# Patient Record
Sex: Female | Born: 1945 | Race: White | Hispanic: No | Smoking: Former smoker
Health system: Southern US, Community
[De-identification: ages and names within clinical notes are randomized; demographics above are authoritative.]

## PROBLEM LIST (undated history)

## (undated) DIAGNOSIS — K069 Disorder of gingiva and edentulous alveolar ridge, unspecified: Secondary | ICD-10-CM

## (undated) DIAGNOSIS — C801 Malignant (primary) neoplasm, unspecified: Secondary | ICD-10-CM

## (undated) DIAGNOSIS — E7800 Pure hypercholesterolemia, unspecified: Secondary | ICD-10-CM

## (undated) DIAGNOSIS — I10 Essential (primary) hypertension: Secondary | ICD-10-CM

## (undated) DIAGNOSIS — N179 Acute kidney failure, unspecified: Secondary | ICD-10-CM

## (undated) DIAGNOSIS — B029 Zoster without complications: Secondary | ICD-10-CM

## (undated) DIAGNOSIS — IMO0002 Reserved for concepts with insufficient information to code with codable children: Secondary | ICD-10-CM

## (undated) DIAGNOSIS — F329 Major depressive disorder, single episode, unspecified: Secondary | ICD-10-CM

## (undated) DIAGNOSIS — M549 Dorsalgia, unspecified: Secondary | ICD-10-CM

## (undated) DIAGNOSIS — F32A Depression, unspecified: Secondary | ICD-10-CM

## (undated) DIAGNOSIS — Z972 Presence of dental prosthetic device (complete) (partial): Secondary | ICD-10-CM

## (undated) DIAGNOSIS — T8859XA Other complications of anesthesia, initial encounter: Secondary | ICD-10-CM

## (undated) DIAGNOSIS — F419 Anxiety disorder, unspecified: Secondary | ICD-10-CM

## (undated) DIAGNOSIS — Z87448 Personal history of other diseases of urinary system: Secondary | ICD-10-CM

## (undated) DIAGNOSIS — Z9289 Personal history of other medical treatment: Secondary | ICD-10-CM

## (undated) DIAGNOSIS — K8020 Calculus of gallbladder without cholecystitis without obstruction: Secondary | ICD-10-CM

## (undated) DIAGNOSIS — Z973 Presence of spectacles and contact lenses: Secondary | ICD-10-CM

## (undated) DIAGNOSIS — T4145XA Adverse effect of unspecified anesthetic, initial encounter: Secondary | ICD-10-CM

## (undated) DIAGNOSIS — I509 Heart failure, unspecified: Secondary | ICD-10-CM

## (undated) DIAGNOSIS — Z87442 Personal history of urinary calculi: Secondary | ICD-10-CM

## (undated) DIAGNOSIS — M6282 Rhabdomyolysis: Secondary | ICD-10-CM

## (undated) DIAGNOSIS — D696 Thrombocytopenia, unspecified: Secondary | ICD-10-CM

## (undated) DIAGNOSIS — IMO0001 Reserved for inherently not codable concepts without codable children: Secondary | ICD-10-CM

## (undated) DIAGNOSIS — C50919 Malignant neoplasm of unspecified site of unspecified female breast: Secondary | ICD-10-CM

## (undated) DIAGNOSIS — I499 Cardiac arrhythmia, unspecified: Secondary | ICD-10-CM

## (undated) DIAGNOSIS — I471 Supraventricular tachycardia: Secondary | ICD-10-CM

## (undated) DIAGNOSIS — J189 Pneumonia, unspecified organism: Secondary | ICD-10-CM

## (undated) HISTORY — PX: COLONOSCOPY: SHX174

## (undated) HISTORY — PX: TOE AMPUTATION: SHX809

## (undated) HISTORY — PX: MOUTH SURGERY: SHX715

## (undated) HISTORY — PX: SPINE SURGERY: SHX786

## (undated) HISTORY — PX: COLON BIOPSY: SHX1369

## (undated) HISTORY — DX: Disorder of gingiva and edentulous alveolar ridge, unspecified: K06.9

## (undated) HISTORY — DX: Reserved for inherently not codable concepts without codable children: IMO0001

## (undated) HISTORY — DX: Reserved for concepts with insufficient information to code with codable children: IMO0002

## (undated) HISTORY — PX: TUBAL LIGATION: SHX77

## (undated) HISTORY — PX: LITHOTRIPSY: SUR834

## (undated) HISTORY — PX: RHINOPLASTY: SUR1284

---

## 1980-05-15 HISTORY — PX: ABDOMINAL HYSTERECTOMY: SHX81

## 1987-05-16 HISTORY — PX: BACK SURGERY: SHX140

## 2002-01-08 ENCOUNTER — Emergency Department (HOSPITAL_COMMUNITY): Admission: EM | Admit: 2002-01-08 | Discharge: 2002-01-09 | Payer: Self-pay | Admitting: Emergency Medicine

## 2002-01-08 ENCOUNTER — Encounter: Payer: Self-pay | Admitting: Emergency Medicine

## 2002-10-31 ENCOUNTER — Ambulatory Visit (HOSPITAL_COMMUNITY): Admission: RE | Admit: 2002-10-31 | Discharge: 2002-10-31 | Payer: Self-pay | Admitting: Gastroenterology

## 2002-10-31 ENCOUNTER — Encounter (INDEPENDENT_AMBULATORY_CARE_PROVIDER_SITE_OTHER): Payer: Self-pay | Admitting: Specialist

## 2004-12-29 ENCOUNTER — Emergency Department (HOSPITAL_COMMUNITY): Admission: EM | Admit: 2004-12-29 | Discharge: 2004-12-29 | Payer: Self-pay | Admitting: Family Medicine

## 2008-04-17 ENCOUNTER — Other Ambulatory Visit: Admission: RE | Admit: 2008-04-17 | Discharge: 2008-04-17 | Payer: Self-pay | Admitting: Family Medicine

## 2008-07-22 ENCOUNTER — Emergency Department (HOSPITAL_COMMUNITY): Admission: EM | Admit: 2008-07-22 | Discharge: 2008-07-22 | Payer: Self-pay | Admitting: *Deleted

## 2008-07-23 ENCOUNTER — Ambulatory Visit (HOSPITAL_COMMUNITY): Admission: AD | Admit: 2008-07-23 | Discharge: 2008-07-23 | Payer: Self-pay | Admitting: Urology

## 2008-08-06 ENCOUNTER — Ambulatory Visit (HOSPITAL_COMMUNITY): Admission: RE | Admit: 2008-08-06 | Discharge: 2008-08-06 | Payer: Self-pay | Admitting: Urology

## 2009-05-15 HISTORY — PX: CHOLECYSTECTOMY: SHX55

## 2009-09-22 ENCOUNTER — Encounter (INDEPENDENT_AMBULATORY_CARE_PROVIDER_SITE_OTHER): Payer: Self-pay | Admitting: General Surgery

## 2009-09-22 ENCOUNTER — Inpatient Hospital Stay (HOSPITAL_COMMUNITY): Admission: EM | Admit: 2009-09-22 | Discharge: 2009-09-23 | Payer: Self-pay | Admitting: Emergency Medicine

## 2010-08-02 LAB — URINE CULTURE: Colony Count: >=100000

## 2010-08-02 LAB — URINALYSIS, ROUTINE W REFLEX MICROSCOPIC
Bilirubin Urine: NEGATIVE
Ketones, ur: NEGATIVE mg/dL
Nitrite: NEGATIVE
Protein, ur: 100 mg/dL — AB
pH: 7.5 (ref 5.0–8.0)

## 2010-08-02 LAB — COMPREHENSIVE METABOLIC PANEL
ALT: 110 U/L — ABNORMAL HIGH (ref 0–35)
AST: 156 U/L — ABNORMAL HIGH (ref 0–37)
Albumin: 4.3 g/dL (ref 3.5–5.2)
Alkaline Phosphatase: 131 U/L — ABNORMAL HIGH (ref 39–117)
BUN: 16 mg/dL (ref 6–23)
Calcium: 10.2 mg/dL (ref 8.4–10.5)
Creatinine, Ser: 1.03 mg/dL (ref 0.4–1.2)
GFR calc non Af Amer: 54 mL/min — ABNORMAL LOW (ref >60)
Potassium: 3.2 mEq/L — ABNORMAL LOW (ref 3.5–5.1)
Total Bilirubin: 1 mg/dL (ref 0.3–1.2)
Total Protein: 8.7 g/dL — ABNORMAL HIGH (ref 6.0–8.3)

## 2010-08-02 LAB — DIFFERENTIAL
Basophils Absolute: 0 10*3/uL (ref 0.0–0.1)
Basophils Relative: 0 % (ref 0–1)
Eosinophils Absolute: 0 10*3/uL (ref 0.0–0.7)
Eosinophils Relative: 0 % (ref 0–5)
Lymphs Abs: 1.2 10*3/uL (ref 0.7–4.0)
Neutrophils Relative %: 88 % — ABNORMAL HIGH (ref 43–77)

## 2010-08-02 LAB — CBC
HCT: 46 % (ref 36.0–46.0)
MCV: 82.9 fL (ref 78.0–100.0)
Platelets: 277 10*3/uL (ref 150–400)
RDW: 14.5 % (ref 11.5–15.5)

## 2010-08-02 LAB — GLUCOSE, CAPILLARY
Glucose-Capillary: 159 mg/dL — ABNORMAL HIGH (ref 70–99)
Glucose-Capillary: 162 mg/dL — ABNORMAL HIGH (ref 70–99)

## 2010-08-02 LAB — URINE MICROSCOPIC-ADD ON

## 2010-08-02 LAB — HEMOGLOBIN A1C
Hgb A1c MFr Bld: 6.4 % — ABNORMAL HIGH (ref <5.7)
Mean Plasma Glucose: 137 mg/dL — ABNORMAL HIGH (ref <117)

## 2010-08-02 LAB — LIPASE, BLOOD: Lipase: 36 U/L (ref 11–59)

## 2010-08-25 LAB — POCT I-STAT, CHEM 8
BUN: 23 mg/dL (ref 6–23)
Chloride: 104 mEq/L (ref 96–112)
Creatinine, Ser: 1.4 mg/dL — ABNORMAL HIGH (ref 0.4–1.2)
Hemoglobin: 16 g/dL — ABNORMAL HIGH (ref 12.0–15.0)
Potassium: 3.7 mEq/L (ref 3.5–5.1)
Sodium: 135 mEq/L (ref 135–145)

## 2010-08-25 LAB — URINE CULTURE
Colony Count: NO GROWTH
Culture: NO GROWTH

## 2010-08-25 LAB — BASIC METABOLIC PANEL
CO2: 25 mEq/L (ref 19–32)
GFR calc non Af Amer: 47 mL/min — ABNORMAL LOW (ref >60)
Glucose, Bld: 156 mg/dL — ABNORMAL HIGH (ref 70–99)
Potassium: 3.7 mEq/L (ref 3.5–5.1)
Sodium: 137 mEq/L (ref 135–145)

## 2010-08-25 LAB — URINALYSIS, ROUTINE W REFLEX MICROSCOPIC
Glucose, UA: NEGATIVE mg/dL
Specific Gravity, Urine: 1.021 (ref 1.005–1.030)

## 2010-08-25 LAB — URINE MICROSCOPIC-ADD ON

## 2010-09-27 NOTE — Op Note (Signed)
Cheyenne Riggs, Cheyenne Riggs                  ACCOUNT NO.:  [id number]   MEDICAL RECORD NO.:  [id number]          PATIENT TYPE:  AMB   LOCATION:  DAY                          FACILITY:  [facility]   PHYSICIAN:  Mark C. Vernie Ammons, M.D.  DATE OF BIRTH:  February 20, 1946   DATE OF PROCEDURE:  DATE OF DISCHARGE:                               OPERATIVE REPORT   PREOPERATIVE DIAGNOSIS:  Left ureteral stone.   POSTOPERATIVE DIAGNOSES:  1. Left ureteral stone.  2. Pyonephrosis.   PROCEDURE:  1. Cystoscopy.  2. Left ureteral catheterization with renal pelvic urine sampling.  3. Left double-J stent placement.   SURGEON:  Mark C. Vernie Ammons, M.D.   ANESTHESIA:  General.   DRAINS:  6-French 24-cm double-J stent in the left ureter (no string).   SPECIMENS:  Urine obtained from the left renal pelvis for culture and  sensitivity.   BLOOD LOSS:  None.   COMPLICATIONS:  None.   INDICATIONS:  The patient is a 65 year old white female who presented to  the emergency room yesterday with severe left flank pain.  There she was  felt to have infected urine and was started on intravenous antibiotics,  and then given oral Cipro to take it home.  She came in to see Dr.  Vonita Moss today and was found to have some white cells in her urine.  She  continued to have severe pain and therefore we discussed treatment  options.  I discussed with her the fact that I would perform  ureteroscopic treatment of her stone if I found no evidence of purulence  of both the stone at the time of the surgery.  She understands the  procedure as well as its risks, complications and alternatives, and has  elected to proceed with surgery.   DESCRIPTION OF OPERATION:  After informed consent, the patient was  brought to the major OR, placed on the table and administered general  anesthesia in the dorsal lithotomy position.  Her genitalia was  sterilely prepped and draped, and an official time-out was then  performed.  Initially a 22-French  cystoscope was passed into the bladder  under direct visualization with a 12-degree lens, and the bladder was  noted to have multiple raised nodules 1 mm and less in size --  consistent with cystitis follicularis.  These were photographed.  I  noted no tumors or stones within the bladder.  The ureteral orifices  appeared to be normal configuration and position, although the left  ureteral orifice appeared to be somewhat slow swollen.   A 0.038-inch  floppy tip guidewire was then passed up the left ureter  under direct fluoroscopic control, into the area of the renal pelvis.  I  then passed a 6-French open-ended ureteral catheter over the guidewire  into the renal pelvis.  As I removed the guidewire I noted gross  purulence coming from the end of the catheter and then it cleared  slightly.  I obtained cloudy urine from the renal pelvis as it dripped  from the open-ended catheter.  I therefore reinserted the guidewire and  removed the open-ended catheter.  A double-J stent was then passed over  the guidewire into the area of the renal pelvis; and as the guidewire  was removed, good curl was noted in the renal pelvis and in the bladder.   FINDINGS:  Findings in the bladder are consistent with infection and  there was clearly gross purulence above the stone.  Therefore, I elected  not to proceed with extraction of the stone at this time due to the  risk.  I had discussed that with the patient as a possibility prior to  the procedure.   She is on Cipro and I will await the culture results.  Once those return  I will treat according to sensitivities, and have her back to the office  next week to schedule definitive ureteroscopy and stent treatment of her  stone versus lithotripsy.      Mark C. Vernie Ammons, M.D.  Electronically Signed     MCO/MEDQ  D:  07/23/2008  T:  07/23/2008  Job:  [id number]

## 2010-09-30 NOTE — Op Note (Signed)
   Cheyenne Riggs, Cheyenne Riggs                            ACCOUNT NO.:  [id number]   MEDICAL RECORD NO.:  [id number]                   PATIENT TYPE:  AMB   LOCATION:  ENDO                                 FACILITY:  [facility]   PHYSICIAN:  Graylin Shiver, M.D.                DATE OF BIRTH:  [birth year]   DATE OF PROCEDURE:  10/31/2002  DATE OF DISCHARGE:                                 OPERATIVE REPORT   PROCEDURE:  Colonoscopy with biopsy.   INDICATIONS FOR PROCEDURE:  Rectal bleeding, family history of colon polyps.   INFORMED CONSENT:  Informed consent was obtained.   PREMEDICATIONS:  1. Fentanyl 75 mcg IV.  2. Versed 6 mg IV.   DESCRIPTION OF PROCEDURE:  With the patient in the left lateral decubitus  position, a rectal exam was performed.  No masses were felt.  The Olympus  colonoscope was inserted into the rectum and advanced around the very  tortuous colon to the cecum.  Cecal landmarks were identified.  The cecum  looked normal.  In the ascending colon, there were two small sessile polyps  which were removed with cold forceps.  In the transverse colon, there were a  couple of small sessile polyps removed with cold forceps.  The descending  colon looked normal.  The sigmoid and rectum revealed a couple of small  hyperplastic-appearing polyps removed with cold forceps.  The sigmoid showed  an occasional diverticulum.  She tolerated the procedure well without  complications.    IMPRESSION:  1. Several small colon polyps.  2. Mild diverticulosis of the sigmoid.   PLAN:  The pathology will be checked.                                               Graylin Shiver, M.D.    SFG/MEDQ  D:  10/31/2002  T:  10/31/2002  Job:  [id number]   cc:   Jillyn Hidden B. Truett Perna, M.D.  [address]  [place]  [place] [address]  Fax: [phone]

## 2011-12-27 DIAGNOSIS — N200 Calculus of kidney: Secondary | ICD-10-CM | POA: Diagnosis not present

## 2011-12-27 DIAGNOSIS — H16149 Punctate keratitis, unspecified eye: Secondary | ICD-10-CM | POA: Diagnosis not present

## 2011-12-27 DIAGNOSIS — N393 Stress incontinence (female) (male): Secondary | ICD-10-CM | POA: Diagnosis not present

## 2012-01-02 DIAGNOSIS — H40019 Open angle with borderline findings, low risk, unspecified eye: Secondary | ICD-10-CM | POA: Diagnosis not present

## 2012-01-02 DIAGNOSIS — E119 Type 2 diabetes mellitus without complications: Secondary | ICD-10-CM | POA: Diagnosis not present

## 2012-01-02 DIAGNOSIS — H04129 Dry eye syndrome of unspecified lacrimal gland: Secondary | ICD-10-CM | POA: Diagnosis not present

## 2012-01-11 DIAGNOSIS — E119 Type 2 diabetes mellitus without complications: Secondary | ICD-10-CM | POA: Diagnosis not present

## 2012-01-25 DIAGNOSIS — N393 Stress incontinence (female) (male): Secondary | ICD-10-CM | POA: Diagnosis not present

## 2012-01-30 DIAGNOSIS — E7800 Pure hypercholesterolemia, unspecified: Secondary | ICD-10-CM | POA: Diagnosis not present

## 2012-01-30 DIAGNOSIS — E119 Type 2 diabetes mellitus without complications: Secondary | ICD-10-CM | POA: Diagnosis not present

## 2012-01-30 DIAGNOSIS — Z79899 Other long term (current) drug therapy: Secondary | ICD-10-CM | POA: Diagnosis not present

## 2012-02-01 DIAGNOSIS — N393 Stress incontinence (female) (male): Secondary | ICD-10-CM | POA: Diagnosis not present

## 2012-02-01 DIAGNOSIS — N318 Other neuromuscular dysfunction of bladder: Secondary | ICD-10-CM | POA: Diagnosis not present

## 2012-02-01 DIAGNOSIS — N3941 Urge incontinence: Secondary | ICD-10-CM | POA: Diagnosis not present

## 2012-02-06 DIAGNOSIS — E119 Type 2 diabetes mellitus without complications: Secondary | ICD-10-CM | POA: Diagnosis not present

## 2012-02-06 DIAGNOSIS — H40019 Open angle with borderline findings, low risk, unspecified eye: Secondary | ICD-10-CM | POA: Diagnosis not present

## 2012-02-06 DIAGNOSIS — H04129 Dry eye syndrome of unspecified lacrimal gland: Secondary | ICD-10-CM | POA: Diagnosis not present

## 2012-02-12 DIAGNOSIS — M6281 Muscle weakness (generalized): Secondary | ICD-10-CM | POA: Diagnosis not present

## 2012-02-12 DIAGNOSIS — R279 Unspecified lack of coordination: Secondary | ICD-10-CM | POA: Diagnosis not present

## 2012-02-12 DIAGNOSIS — N3000 Acute cystitis without hematuria: Secondary | ICD-10-CM | POA: Diagnosis not present

## 2012-02-12 DIAGNOSIS — N3941 Urge incontinence: Secondary | ICD-10-CM | POA: Diagnosis not present

## 2012-02-27 DIAGNOSIS — Z23 Encounter for immunization: Secondary | ICD-10-CM | POA: Diagnosis not present

## 2012-03-07 DIAGNOSIS — R279 Unspecified lack of coordination: Secondary | ICD-10-CM | POA: Diagnosis not present

## 2012-03-07 DIAGNOSIS — M6281 Muscle weakness (generalized): Secondary | ICD-10-CM | POA: Diagnosis not present

## 2012-03-07 DIAGNOSIS — N393 Stress incontinence (female) (male): Secondary | ICD-10-CM | POA: Diagnosis not present

## 2012-03-07 DIAGNOSIS — N3941 Urge incontinence: Secondary | ICD-10-CM | POA: Diagnosis not present

## 2012-03-11 DIAGNOSIS — N3941 Urge incontinence: Secondary | ICD-10-CM | POA: Diagnosis not present

## 2012-03-11 DIAGNOSIS — N393 Stress incontinence (female) (male): Secondary | ICD-10-CM | POA: Diagnosis not present

## 2012-03-11 DIAGNOSIS — R279 Unspecified lack of coordination: Secondary | ICD-10-CM | POA: Diagnosis not present

## 2012-03-11 DIAGNOSIS — M6281 Muscle weakness (generalized): Secondary | ICD-10-CM | POA: Diagnosis not present

## 2012-03-21 DIAGNOSIS — N3941 Urge incontinence: Secondary | ICD-10-CM | POA: Diagnosis not present

## 2012-03-21 DIAGNOSIS — R279 Unspecified lack of coordination: Secondary | ICD-10-CM | POA: Diagnosis not present

## 2012-03-21 DIAGNOSIS — M6281 Muscle weakness (generalized): Secondary | ICD-10-CM | POA: Diagnosis not present

## 2012-03-21 DIAGNOSIS — N393 Stress incontinence (female) (male): Secondary | ICD-10-CM | POA: Diagnosis not present

## 2012-04-16 DIAGNOSIS — N393 Stress incontinence (female) (male): Secondary | ICD-10-CM | POA: Diagnosis not present

## 2012-04-16 DIAGNOSIS — R279 Unspecified lack of coordination: Secondary | ICD-10-CM | POA: Diagnosis not present

## 2012-04-16 DIAGNOSIS — N3941 Urge incontinence: Secondary | ICD-10-CM | POA: Diagnosis not present

## 2012-04-16 DIAGNOSIS — M6281 Muscle weakness (generalized): Secondary | ICD-10-CM | POA: Diagnosis not present

## 2012-05-06 DIAGNOSIS — E782 Mixed hyperlipidemia: Secondary | ICD-10-CM | POA: Diagnosis not present

## 2012-05-06 DIAGNOSIS — I10 Essential (primary) hypertension: Secondary | ICD-10-CM | POA: Diagnosis not present

## 2012-05-06 DIAGNOSIS — E119 Type 2 diabetes mellitus without complications: Secondary | ICD-10-CM | POA: Diagnosis not present

## 2012-05-06 DIAGNOSIS — Z23 Encounter for immunization: Secondary | ICD-10-CM | POA: Diagnosis not present

## 2012-05-21 DIAGNOSIS — N3941 Urge incontinence: Secondary | ICD-10-CM | POA: Diagnosis not present

## 2012-05-21 DIAGNOSIS — M6281 Muscle weakness (generalized): Secondary | ICD-10-CM | POA: Diagnosis not present

## 2012-05-21 DIAGNOSIS — R279 Unspecified lack of coordination: Secondary | ICD-10-CM | POA: Diagnosis not present

## 2012-05-21 DIAGNOSIS — N393 Stress incontinence (female) (male): Secondary | ICD-10-CM | POA: Diagnosis not present

## 2012-09-07 DIAGNOSIS — J209 Acute bronchitis, unspecified: Secondary | ICD-10-CM | POA: Diagnosis not present

## 2012-09-17 DIAGNOSIS — I10 Essential (primary) hypertension: Secondary | ICD-10-CM | POA: Diagnosis not present

## 2012-09-17 DIAGNOSIS — F17200 Nicotine dependence, unspecified, uncomplicated: Secondary | ICD-10-CM | POA: Diagnosis not present

## 2012-09-17 DIAGNOSIS — E1129 Type 2 diabetes mellitus with other diabetic kidney complication: Secondary | ICD-10-CM | POA: Diagnosis not present

## 2012-09-17 DIAGNOSIS — Z23 Encounter for immunization: Secondary | ICD-10-CM | POA: Diagnosis not present

## 2012-09-17 DIAGNOSIS — E7800 Pure hypercholesterolemia, unspecified: Secondary | ICD-10-CM | POA: Diagnosis not present

## 2012-09-17 DIAGNOSIS — Z13820 Encounter for screening for osteoporosis: Secondary | ICD-10-CM | POA: Diagnosis not present

## 2012-09-23 DIAGNOSIS — Z1231 Encounter for screening mammogram for malignant neoplasm of breast: Secondary | ICD-10-CM | POA: Diagnosis not present

## 2012-09-23 DIAGNOSIS — Z780 Asymptomatic menopausal state: Secondary | ICD-10-CM | POA: Diagnosis not present

## 2013-01-30 DIAGNOSIS — Z1211 Encounter for screening for malignant neoplasm of colon: Secondary | ICD-10-CM | POA: Diagnosis not present

## 2013-01-30 DIAGNOSIS — E1129 Type 2 diabetes mellitus with other diabetic kidney complication: Secondary | ICD-10-CM | POA: Diagnosis not present

## 2013-01-30 DIAGNOSIS — I10 Essential (primary) hypertension: Secondary | ICD-10-CM | POA: Diagnosis not present

## 2013-01-30 DIAGNOSIS — Z23 Encounter for immunization: Secondary | ICD-10-CM | POA: Diagnosis not present

## 2013-01-30 DIAGNOSIS — F17200 Nicotine dependence, unspecified, uncomplicated: Secondary | ICD-10-CM | POA: Diagnosis not present

## 2013-02-13 DIAGNOSIS — H35039 Hypertensive retinopathy, unspecified eye: Secondary | ICD-10-CM | POA: Diagnosis not present

## 2013-02-13 DIAGNOSIS — H3560 Retinal hemorrhage, unspecified eye: Secondary | ICD-10-CM | POA: Diagnosis not present

## 2013-02-13 DIAGNOSIS — H40019 Open angle with borderline findings, low risk, unspecified eye: Secondary | ICD-10-CM | POA: Diagnosis not present

## 2013-02-13 DIAGNOSIS — E119 Type 2 diabetes mellitus without complications: Secondary | ICD-10-CM | POA: Diagnosis not present

## 2013-02-13 DIAGNOSIS — H2510 Age-related nuclear cataract, unspecified eye: Secondary | ICD-10-CM | POA: Diagnosis not present

## 2013-02-13 DIAGNOSIS — H04129 Dry eye syndrome of unspecified lacrimal gland: Secondary | ICD-10-CM | POA: Diagnosis not present

## 2013-02-28 DIAGNOSIS — Z23 Encounter for immunization: Secondary | ICD-10-CM | POA: Diagnosis not present

## 2013-04-17 DIAGNOSIS — H04129 Dry eye syndrome of unspecified lacrimal gland: Secondary | ICD-10-CM | POA: Diagnosis not present

## 2013-04-17 DIAGNOSIS — H40019 Open angle with borderline findings, low risk, unspecified eye: Secondary | ICD-10-CM | POA: Diagnosis not present

## 2013-07-17 DIAGNOSIS — H35039 Hypertensive retinopathy, unspecified eye: Secondary | ICD-10-CM | POA: Diagnosis not present

## 2013-07-17 DIAGNOSIS — H40019 Open angle with borderline findings, low risk, unspecified eye: Secondary | ICD-10-CM | POA: Diagnosis not present

## 2013-07-17 DIAGNOSIS — E119 Type 2 diabetes mellitus without complications: Secondary | ICD-10-CM | POA: Diagnosis not present

## 2013-07-17 DIAGNOSIS — H04129 Dry eye syndrome of unspecified lacrimal gland: Secondary | ICD-10-CM | POA: Diagnosis not present

## 2013-08-03 DIAGNOSIS — J209 Acute bronchitis, unspecified: Secondary | ICD-10-CM | POA: Diagnosis not present

## 2013-08-03 DIAGNOSIS — R059 Cough, unspecified: Secondary | ICD-10-CM | POA: Diagnosis not present

## 2013-08-03 DIAGNOSIS — R05 Cough: Secondary | ICD-10-CM | POA: Diagnosis not present

## 2013-12-10 DIAGNOSIS — M47817 Spondylosis without myelopathy or radiculopathy, lumbosacral region: Secondary | ICD-10-CM | POA: Diagnosis not present

## 2013-12-10 DIAGNOSIS — IMO0002 Reserved for concepts with insufficient information to code with codable children: Secondary | ICD-10-CM | POA: Diagnosis not present

## 2013-12-10 DIAGNOSIS — M4310 Spondylolisthesis, site unspecified: Secondary | ICD-10-CM | POA: Diagnosis not present

## 2013-12-18 DIAGNOSIS — M48061 Spinal stenosis, lumbar region without neurogenic claudication: Secondary | ICD-10-CM | POA: Diagnosis not present

## 2013-12-18 DIAGNOSIS — M545 Low back pain, unspecified: Secondary | ICD-10-CM | POA: Diagnosis not present

## 2013-12-25 DIAGNOSIS — M48061 Spinal stenosis, lumbar region without neurogenic claudication: Secondary | ICD-10-CM | POA: Diagnosis not present

## 2013-12-25 DIAGNOSIS — M545 Low back pain, unspecified: Secondary | ICD-10-CM | POA: Diagnosis not present

## 2013-12-30 DIAGNOSIS — M545 Low back pain, unspecified: Secondary | ICD-10-CM | POA: Diagnosis not present

## 2013-12-30 DIAGNOSIS — M48061 Spinal stenosis, lumbar region without neurogenic claudication: Secondary | ICD-10-CM | POA: Diagnosis not present

## 2014-01-01 DIAGNOSIS — M545 Low back pain, unspecified: Secondary | ICD-10-CM | POA: Diagnosis not present

## 2014-01-01 DIAGNOSIS — M48061 Spinal stenosis, lumbar region without neurogenic claudication: Secondary | ICD-10-CM | POA: Diagnosis not present

## 2014-01-06 DIAGNOSIS — M48061 Spinal stenosis, lumbar region without neurogenic claudication: Secondary | ICD-10-CM | POA: Diagnosis not present

## 2014-01-06 DIAGNOSIS — M545 Low back pain, unspecified: Secondary | ICD-10-CM | POA: Diagnosis not present

## 2014-01-08 DIAGNOSIS — M545 Low back pain, unspecified: Secondary | ICD-10-CM | POA: Diagnosis not present

## 2014-01-08 DIAGNOSIS — M48061 Spinal stenosis, lumbar region without neurogenic claudication: Secondary | ICD-10-CM | POA: Diagnosis not present

## 2014-01-13 DIAGNOSIS — M545 Low back pain, unspecified: Secondary | ICD-10-CM | POA: Diagnosis not present

## 2014-01-13 DIAGNOSIS — M48061 Spinal stenosis, lumbar region without neurogenic claudication: Secondary | ICD-10-CM | POA: Diagnosis not present

## 2014-01-15 DIAGNOSIS — M48061 Spinal stenosis, lumbar region without neurogenic claudication: Secondary | ICD-10-CM | POA: Diagnosis not present

## 2014-01-15 DIAGNOSIS — M545 Low back pain, unspecified: Secondary | ICD-10-CM | POA: Diagnosis not present

## 2014-01-19 DIAGNOSIS — H019 Unspecified inflammation of eyelid: Secondary | ICD-10-CM | POA: Diagnosis not present

## 2014-01-20 DIAGNOSIS — M48061 Spinal stenosis, lumbar region without neurogenic claudication: Secondary | ICD-10-CM | POA: Diagnosis not present

## 2014-01-20 DIAGNOSIS — M545 Low back pain, unspecified: Secondary | ICD-10-CM | POA: Diagnosis not present

## 2014-01-22 DIAGNOSIS — M48061 Spinal stenosis, lumbar region without neurogenic claudication: Secondary | ICD-10-CM | POA: Diagnosis not present

## 2014-01-22 DIAGNOSIS — M545 Low back pain, unspecified: Secondary | ICD-10-CM | POA: Diagnosis not present

## 2014-01-23 DIAGNOSIS — F329 Major depressive disorder, single episode, unspecified: Secondary | ICD-10-CM | POA: Diagnosis not present

## 2014-01-23 DIAGNOSIS — F17200 Nicotine dependence, unspecified, uncomplicated: Secondary | ICD-10-CM | POA: Diagnosis not present

## 2014-01-23 DIAGNOSIS — E559 Vitamin D deficiency, unspecified: Secondary | ICD-10-CM | POA: Diagnosis not present

## 2014-01-23 DIAGNOSIS — D126 Benign neoplasm of colon, unspecified: Secondary | ICD-10-CM | POA: Diagnosis not present

## 2014-01-23 DIAGNOSIS — E785 Hyperlipidemia, unspecified: Secondary | ICD-10-CM | POA: Diagnosis not present

## 2014-01-23 DIAGNOSIS — E1129 Type 2 diabetes mellitus with other diabetic kidney complication: Secondary | ICD-10-CM | POA: Diagnosis not present

## 2014-01-23 DIAGNOSIS — N189 Chronic kidney disease, unspecified: Secondary | ICD-10-CM | POA: Diagnosis not present

## 2014-01-27 DIAGNOSIS — Z1231 Encounter for screening mammogram for malignant neoplasm of breast: Secondary | ICD-10-CM | POA: Diagnosis not present

## 2014-01-29 DIAGNOSIS — N269 Renal sclerosis, unspecified: Secondary | ICD-10-CM | POA: Diagnosis not present

## 2014-01-29 DIAGNOSIS — M545 Low back pain, unspecified: Secondary | ICD-10-CM | POA: Diagnosis not present

## 2014-01-29 DIAGNOSIS — N289 Disorder of kidney and ureter, unspecified: Secondary | ICD-10-CM | POA: Diagnosis not present

## 2014-01-29 DIAGNOSIS — N200 Calculus of kidney: Secondary | ICD-10-CM | POA: Diagnosis not present

## 2014-01-29 DIAGNOSIS — M48061 Spinal stenosis, lumbar region without neurogenic claudication: Secondary | ICD-10-CM | POA: Diagnosis not present

## 2014-01-30 DIAGNOSIS — N63 Unspecified lump in unspecified breast: Secondary | ICD-10-CM | POA: Diagnosis not present

## 2014-02-03 DIAGNOSIS — Z23 Encounter for immunization: Secondary | ICD-10-CM | POA: Diagnosis not present

## 2014-02-05 ENCOUNTER — Other Ambulatory Visit: Payer: Self-pay | Admitting: Radiology

## 2014-02-05 DIAGNOSIS — N63 Unspecified lump in unspecified breast: Secondary | ICD-10-CM | POA: Diagnosis not present

## 2014-02-05 DIAGNOSIS — C50419 Malignant neoplasm of upper-outer quadrant of unspecified female breast: Secondary | ICD-10-CM | POA: Diagnosis not present

## 2014-02-05 HISTORY — PX: BREAST BIOPSY: SHX20

## 2014-02-09 DIAGNOSIS — C50419 Malignant neoplasm of upper-outer quadrant of unspecified female breast: Secondary | ICD-10-CM | POA: Diagnosis not present

## 2014-02-16 ENCOUNTER — Telehealth: Payer: Self-pay | Admitting: *Deleted

## 2014-02-16 NOTE — Telephone Encounter (Signed)
Received referral from [place].  Called pt and confirmed 03/24/14 appt w/ her.  Mailed before appt letter, welcoming packet & intake form to pt.  Emailed Engineer, civil (consulting) at [employer] to make her aware.  Sent paperwork to HIM to scan.

## 2014-02-16 NOTE — Progress Notes (Signed)
Location of Breast Cancer:Right Breast upper outer quadrant.  Histology per Pathology Report:02/05/14: FINAL DIAGNOSIS Diagnosis Breast, right, needle core biopsy, mass, UOQ 10 o'clock - INVASIVE DUCTAL CARCINOMA, SEE COMMENT. Receptor Status: ER(), PR (), Her2-neu ()  Did patient present with symptoms (if so, please note symptoms) or was this found on screening mammography?:found on screening mammogram  Past/Anticipated interventions by surgeon, if OVF:IEPPIRJJO for 02/27/14 with Dr.Byerly  Past/Anticipated interventions by medical oncology, if any: Chemotherapy.Scheduled Friday 02/20/14 at 12:30 pm  Lymphedema issues, if any:    Pain issues, if any: Back pain  SAFETY ISSUES:  Prior radiation? No  Pacemaker/ICD? No  Possible current pregnancy?No  Is the patient on methotrexate? No  Current Complaints / other details:Divorced.Menses age 22.first birth age 71.1 son.last menstrual period age 75.Hormonal replacement therapy less than 10 years. Smoker; 1ppd time x 30 years    Arlyss Repress, RN 02/16/2014,10:45 AM

## 2014-02-17 ENCOUNTER — Other Ambulatory Visit (INDEPENDENT_AMBULATORY_CARE_PROVIDER_SITE_OTHER): Payer: Self-pay | Admitting: General Surgery

## 2014-02-17 DIAGNOSIS — C50911 Malignant neoplasm of unspecified site of right female breast: Secondary | ICD-10-CM

## 2014-02-18 ENCOUNTER — Ambulatory Visit
Admission: RE | Admit: 2014-02-18 | Discharge: 2014-02-18 | Disposition: A | Discharge status: Home / Self Care | Payer: Medicare Other | Source: Ambulatory Visit | Attending: Radiation Oncology | Admitting: Radiation Oncology

## 2014-02-18 ENCOUNTER — Telehealth: Payer: Self-pay | Admitting: *Deleted

## 2014-02-18 ENCOUNTER — Encounter: Payer: Self-pay | Admitting: Radiation Oncology

## 2014-02-18 DIAGNOSIS — C50411 Malignant neoplasm of upper-outer quadrant of right female breast: Secondary | ICD-10-CM | POA: Insufficient documentation

## 2014-02-18 DIAGNOSIS — I10 Essential (primary) hypertension: Secondary | ICD-10-CM

## 2014-02-18 DIAGNOSIS — C50419 Malignant neoplasm of upper-outer quadrant of unspecified female breast: Secondary | ICD-10-CM | POA: Diagnosis not present

## 2014-02-18 DIAGNOSIS — Z510 Encounter for antineoplastic radiation therapy: Secondary | ICD-10-CM | POA: Diagnosis not present

## 2014-02-18 HISTORY — DX: Calculus of gallbladder without cholecystitis without obstruction: K80.20

## 2014-02-18 HISTORY — DX: Essential (primary) hypertension: I10

## 2014-02-18 HISTORY — DX: Pure hypercholesterolemia, unspecified: E78.00

## 2014-02-18 HISTORY — DX: Dorsalgia, unspecified: M54.9

## 2014-02-18 HISTORY — DX: Anxiety disorder, unspecified: F41.9

## 2014-02-18 HISTORY — DX: Major depressive disorder, single episode, unspecified: F32.9

## 2014-02-18 HISTORY — DX: Personal history of other diseases of urinary system: Z87.448

## 2014-02-18 HISTORY — DX: Depression, unspecified: F32.A

## 2014-02-18 NOTE — Progress Notes (Signed)
  Radiation Oncology         [phone]) [phone] ________________________________  Initial outpatient Consultation - Date: 02/18/2014   Name: Cheyenne Riggs MRN: [id number]   DOB: February 07, 1946  REFERRING PHYSICIAN: Stark Klein, MD  DIAGNOSIS:    ICD-9-CM ICD-10-CM  1. 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 female  who underwent a screening mammogram. A abnormality was seen in the upper outer quadrant of the right breast. This was biopsied and found to be a triple negative invasive ductal carcinoma. Ultrasound showed a 1.2 cm mass. MRI was not performed. She has met with Dr. Barry Dienes and has elected for breast conservation. She is seeing medical oncology on Friday. She has no family history of breast cancer. She is postmenopausal and never used hormone replacement therapy. She had menarche at 31 with her age at menopause over the past 5 or 6 years. She is BX G1 with her first child at 42. She really is in good health with a past medical history of a cholecystectomy, partial hysterectomy, low back surgery, tubal ligation, and kidney stones. She also has anxiety disorder, depression, high blood pressure, and high cholesterol.  PREVIOUS RADIATION THERAPY: No  FAMILY HISTORY: No family history on file.  SOCIAL HISTORY:  History  Substance Use Topics  . Smoking status: Not on file  . Smokeless tobacco: Not on file  . Alcohol Use: Not on file    REVIEW OF SYSTEMS:  A 15 point review of systems is documented in the electronic medical record. This was obtained by the nursing staff. However, I reviewed this with the patient to discuss relevant findings and make appropriate changes.  Pertinent positives are included in the chart.   PHYSICAL EXAM: There were no vitals filed for this visit.. . Pleasant female in no distress sitting comfortably on  examining table. She is alert minus x3. She has no palpable cervical or subclavicular adenopathy. She has some biopsy change in the upper outer quadrant of the right breast with no palpable masses. No palpable abnormalities of the left breast. No palpable axillary supraclavicular or cervical adenopathy.  IMPRESSION: T1 C. N0 triple negative right breast cancer  PLAN:I spoke to the patient today regarding her diagnosis and options for treatment. We discussed the equivalence in terms of survival and local failure between mastectomy and breast conservation. We discussed the role of radiation in decreasing local failures in patients who undergo lumpectomy. We discussed the process of simulation and the placement tattoos. We discussed 6 weeks of treatment as an outpatient. We discussed the possibility of asymptomatic lung damage. We discussed the low likelihood of secondary malignancies. We discussed the possible side effects including but not limited to skin redness, fatigue, permanent skin darkening, and breast swelling.    We discussed that if she needed chemotherapy which she likely will given her triple negative status, that that would be performed prior to radiation.  I spent 40 minutes  face to face with the patient and more than 50% of that time was spent in counseling and/or coordination of care.   ------------------------------------------------  Thea Silversmith, MD

## 2014-02-18 NOTE — Telephone Encounter (Signed)
Per Dawn - changed pts appt to 10/9 at 12:30.  Pt is aware.

## 2014-02-18 NOTE — Progress Notes (Signed)
Please see the Nurse Progress Note in the MD Initial Consult Encounter for this patient. 

## 2014-02-19 NOTE — Addendum Note (Signed)
Encounter addended by: Arlyss Repress, RN on: 02/19/2014  8:43 AM<BR>     Documentation filed: Charges VN

## 2014-02-20 ENCOUNTER — Encounter (INDEPENDENT_AMBULATORY_CARE_PROVIDER_SITE_OTHER): Payer: Self-pay

## 2014-02-20 ENCOUNTER — Ambulatory Visit (HOSPITAL_BASED_OUTPATIENT_CLINIC_OR_DEPARTMENT_OTHER): Payer: Medicare Other | Admitting: Hematology and Oncology

## 2014-02-20 ENCOUNTER — Telehealth: Payer: Self-pay | Admitting: Hematology and Oncology

## 2014-02-20 ENCOUNTER — Encounter: Payer: Self-pay | Admitting: Hematology and Oncology

## 2014-02-20 ENCOUNTER — Ambulatory Visit: Payer: Medicare Other

## 2014-02-20 ENCOUNTER — Other Ambulatory Visit (INDEPENDENT_AMBULATORY_CARE_PROVIDER_SITE_OTHER): Payer: Self-pay | Admitting: General Surgery

## 2014-02-20 VITALS — BP 147/67 | HR 88 | Temp 98.2°F | Resp 19 | Ht 63.0 in | Wt 209.3 lb

## 2014-02-20 DIAGNOSIS — C50911 Malignant neoplasm of unspecified site of right female breast: Secondary | ICD-10-CM

## 2014-02-20 DIAGNOSIS — C50411 Malignant neoplasm of upper-outer quadrant of right female breast: Secondary | ICD-10-CM | POA: Diagnosis not present

## 2014-02-20 NOTE — Progress Notes (Signed)
[facility] CONSULT NOTE  Patient Care Team: Hulan Fess, MD as PCP - General (Family Medicine)  CHIEF COMPLAINTS/PURPOSE OF CONSULTATION:  Newly diagnosed breast cancer  HISTORY OF PRESENTING ILLNESS:  Cheyenne Riggs 68 y.o. female is here because of recent diagnosis of right-sided breast cancer. She had a routine screening mammogram that identified a 1.8 cm mass in the right breast. She had an ultrasound and a biopsy that revealed triple negative invasive ductal carcinoma. She had seen Dr. Barry Dienes who possibly surgery with lumpectomy. She was presented at the breast tumor board and the recommendation was for her to receive adjuvant systemic chemotherapy. She was asked to come today so that we can discuss adjuvant treatment options. She is by herself. Her son lives in [place]. She plans to do her entire treatment by driving herself to each of these treatments.  I reviewed her records extensively and collaborated the history with the patient. 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 She describes that she may have been exposed to dust and chemicals all her life.  In terms of breast cancer risk profile:  She menarched at early age of 58 and went to menopause at age 60  She had one pregnancy, her first child was born at age 48  She has received birth control pills for approximately 10-15 years.  She was never exposed to fertility medications or hormone replacement therapy.  She has no family history of Breast/GYN/GI cancer  MEDICAL HISTORY:  Past Medical History  Diagnosis Date  . Anxiety   . Back pain   . H/O bladder problems   . Cholelithiasis   . Depression   . High blood pressure   . Hypercholesteremia   . Kidney stones     SURGICAL HISTORY: Past Surgical History  Procedure Laterality Date  . Breast biopsy Right 02/05/2014    invasive ductal cncer  . Colon  biopsy    . Cholecystectomy    . Mouth surgery    . Spine surgery    . Colonoscopy    . Tubal ligation    . Back surgery      SOCIAL HISTORY: History   Social History  . Marital Status: Divorced    Spouse Name: N/A    Number of Children: N/A  . Years of Education: N/A   Occupational History  . Not on file.   Social History Main Topics  . Smoking status: Current Every Day Smoker -- 1.00 packs/day    Types: Cigarettes  . Smokeless tobacco: Not on file  . Alcohol Use: No  . Drug Use: No  . Sexual Activity: Not on file   Other Topics Concern  . Not on file   Social History Narrative  . No narrative on file    FAMILY HISTORY: No family history on file.  ALLERGIES:  is allergic to ciprofloxacin; demerol; lidocaine; other; septra; and codeine.  MEDICATIONS:  Current Outpatient Prescriptions  Medication Sig Dispense Refill  . acetaminophen (TYLENOL) 325 MG tablet Take 650 mg by mouth every 6 (six) hours as needed.      . ALPRAZolam (XANAX) 0.25 MG tablet Take 0.25 mg by mouth 2 (two) times daily as needed for anxiety.      Marland Kitchen aspirin 81 MG tablet Take 81 mg by mouth daily.      Marland Kitchen FLUoxetine (PROZAC) 10 MG tablet Take 10 mg by  mouth daily.      . Probiotic Product (PROBIOTIC DAILY PO) Take by mouth daily.      . ranitidine (ZANTAC) 150 MG tablet Take 150 mg by mouth as needed.       . rosuvastatin (CRESTOR) 20 MG tablet Take 20 mg by mouth daily.      . valsartan (DIOVAN) 320 MG tablet Take 320 mg by mouth daily.       No current facility-administered medications for this visit.    REVIEW OF SYSTEMS:   Constitutional: Denies fevers, chills or abnormal night sweats Eyes: Denies blurriness of vision, double vision or watery eyes Ears, nose, mouth, throat, and face: Denies mucositis or sore throat Respiratory: Denies cough, dyspnea or wheezes Cardiovascular: Denies palpitation, chest discomfort or lower extremity swelling Gastrointestinal:  Denies nausea, heartburn or  change in bowel habits Skin: Denies abnormal skin rashes Lymphatics: Denies new lymphadenopathy or easy bruising Neurological:Denies numbness, tingling or new weaknesses Behavioral/Psych: Mood is stable, no new changes  Breast:  Denies any palpable lumps or discharge All other systems were reviewed with the patient and are negative.  PHYSICAL EXAMINATION: ECOG PERFORMANCE STATUS: 0 - Asymptomatic  Filed Vitals:   02/20/14 1240  BP: 147/67  Pulse: 88  Temp: 98.2 F (36.8 C)  Resp: 19   Filed Weights   02/20/14 1240  Weight: 209 lb 4.8 oz (94.938 kg)    GENERAL:alert, no distress and comfortable SKIN: skin color, texture, turgor are normal, no rashes or significant lesions EYES: normal, conjunctiva are pink and non-injected, sclera clear OROPHARYNX:no exudate, no erythema and lips, buccal mucosa, and tongue normal  NECK: supple, thyroid normal size, non-tender, without nodularity LYMPH:  no palpable lymphadenopathy in the cervical, axillary or inguinal LUNGS: clear to auscultation and percussion with normal breathing effort HEART: regular rate & rhythm and no murmurs and no lower extremity edema ABDOMEN:abdomen soft, non-tender and normal bowel sounds Musculoskeletal:no cyanosis of digits and no clubbing  PSYCH: alert & oriented x 3 with fluent speech NEURO: no focal motor/sensory deficits BREAST: No palpable nodules in breast. No palpable axillary or supraclavicular lymphadenopathy  LABORATORY DATA:  I have reviewed the data as listed Lab Results  Component Value Date   WBC 11.9* 09/22/2009   HGB 15.3* 09/22/2009   HCT 46.0 09/22/2009   MCV 82.9 09/22/2009   PLT 277 09/22/2009   Lab Results  Component Value Date   NA 133* 09/22/2009   K 3.2* 09/22/2009   CL 96 09/22/2009   CO2 26 09/22/2009    RADIOGRAPHIC STUDIES: I have personally reviewed the radiological reports and agreed with the findings in the report.  ASSESSMENT AND PLAN:  Malignant neoplasm of upper-outer  quadrant of female breast Right breast invasive ductal carcinoma T1 C. N0 M0 stage IA clinical stage ER 0% PR 0% HER-2/neu negative  Pathology counseling: I discussed with the patient the details of pathology report including the significance of ER PR and HER-2/neu receptors. Given the fact that she is triple negative, patient would most likely receive chemotherapy after surgery.  Chemotherapy counseling: I recommended adjuvant Adriamycin Cytoxan dose dense x4 every 2 weeks followed by Abraxane weekly x12. 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 I discussed the risks and benefits of chemotherapy including the risks of nausea/ vomiting, risk of infection from low WBC count and had Neulasta given the after chemotherapy would decrease that risk, fatigue due to chemo or anemia, bruising or bleeding due to low platelets, mouth sores, loss/ change in  taste and decreased appetite. Liver and kidney function will be monitored through out chemotherapy as abnormalities in liver and kidney function may be a side effect of treatment. Cardiac dysfunction due to Adriamycin was also discussed in detail. Risk of permanent bone marrow dysfunction and leukemia due to chemo were also discussed.  I discussed the case with Dr. Barry Dienes who agreed with the treatment plan and will place a port in the OR. I would like to see her back after surgery to get her started on adjuvant chemotherapy.  All questions were answered. The patient knows to call the clinic with any problems, questions or concerns. I spent 55 minutes counseling the patient face to face. The total time spent in the appointment was 60 minutes and more than 50% was on counseling.     Rulon Eisenmenger, MD 02/20/2014 2:32 PM

## 2014-02-20 NOTE — Progress Notes (Signed)
Checked in new patient with no financial issues prior to seeing the dr. She has appt card and has not been out of the country., she has primary and [employer], but I gave her [facility] card just in case asst is needed.

## 2014-02-20 NOTE — Assessment & Plan Note (Signed)
Right breast invasive ductal carcinoma T1 C. N0 M0 stage IA clinical stage ER 0% PR 0% HER-2/neu negative  Pathology counseling: I discussed with the patient the details of pathology report including the significance of ER PR and HER-2/neu receptors. Given the fact that she is triple negative, patient would most likely receive chemotherapy after surgery.  Chemotherapy counseling: I recommended adjuvant Adriamycin Cytoxan dose dense x4 every 2 weeks followed by Abraxane weekly x12. 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 I discussed the risks and benefits of chemotherapy including the risks of nausea/ vomiting, risk of infection from low WBC count and had Neulasta given the after chemotherapy would decrease that risk, fatigue due to chemo or anemia, bruising or bleeding due to low platelets, mouth sores, loss/ change in taste and decreased appetite. Liver and kidney function will be monitored through out chemotherapy as abnormalities in liver and kidney function may be a side effect of treatment. Cardiac dysfunction due to Adriamycin was also discussed in detail. Risk of permanent bone marrow dysfunction and leukemia due to chemo were also discussed.  I discussed the case with Dr. Barry Dienes who agreed with the treatment plan and will place a port in the OR. I would like to see her back after surgery to get her started on adjuvant chemotherapy.

## 2014-02-20 NOTE — Addendum Note (Signed)
Addended by: Prentiss Bells on: 02/20/2014 03:54 PM   Modules accepted: Orders

## 2014-02-20 NOTE — Telephone Encounter (Signed)
, °

## 2014-02-20 NOTE — Progress Notes (Signed)
MD note created during office visit sent to scan.  Copy to patient.   

## 2014-02-20 NOTE — Progress Notes (Signed)
New pt intake form sent to scan.

## 2014-02-23 ENCOUNTER — Telehealth: Payer: Self-pay

## 2014-02-23 ENCOUNTER — Encounter (HOSPITAL_BASED_OUTPATIENT_CLINIC_OR_DEPARTMENT_OTHER): Payer: Self-pay | Admitting: *Deleted

## 2014-02-23 NOTE — Progress Notes (Signed)
02/23/14 0924  OBSTRUCTIVE SLEEP APNEA  Have you ever been diagnosed with sleep apnea through a sleep study? No  Do you snore loudly (loud enough to be heard through closed doors)?  1  Do you often feel tired, fatigued, or sleepy during the daytime? 0  Has anyone observed you stop breathing during your sleep? 0  Do you have, or are you being treated for high blood pressure? 1  BMI more than 35 kg/m2? 1  Age over 68 years old? 1  Gender: 0  Obstructive Sleep Apnea Score 4  Score 4 or greater  Results sent to PCP

## 2014-02-23 NOTE — Telephone Encounter (Signed)
Pt called confused about treatment plan/schedule.  Clarified surgery date and appointments at clinic.  Pt voiced understanding.

## 2014-02-23 NOTE — Progress Notes (Signed)
She will come in for ekg bmet

## 2014-02-24 ENCOUNTER — Telehealth: Payer: Self-pay | Admitting: Hematology and Oncology

## 2014-02-24 NOTE — Telephone Encounter (Signed)
, °

## 2014-02-25 ENCOUNTER — Encounter (HOSPITAL_BASED_OUTPATIENT_CLINIC_OR_DEPARTMENT_OTHER)
Admission: RE | Admit: 2014-02-25 | Discharge: 2014-02-25 | Disposition: A | Discharge status: Home / Self Care | Payer: Medicare Other | Source: Ambulatory Visit | Attending: General Surgery | Admitting: General Surgery

## 2014-02-25 DIAGNOSIS — I10 Essential (primary) hypertension: Secondary | ICD-10-CM | POA: Diagnosis not present

## 2014-02-25 DIAGNOSIS — Z885 Allergy status to narcotic agent status: Secondary | ICD-10-CM | POA: Diagnosis not present

## 2014-02-25 DIAGNOSIS — F329 Major depressive disorder, single episode, unspecified: Secondary | ICD-10-CM | POA: Diagnosis not present

## 2014-02-25 DIAGNOSIS — E780 Pure hypercholesterolemia: Secondary | ICD-10-CM | POA: Diagnosis not present

## 2014-02-25 DIAGNOSIS — R928 Other abnormal and inconclusive findings on diagnostic imaging of breast: Secondary | ICD-10-CM | POA: Diagnosis not present

## 2014-02-25 DIAGNOSIS — Z7982 Long term (current) use of aspirin: Secondary | ICD-10-CM | POA: Diagnosis not present

## 2014-02-25 DIAGNOSIS — Z881 Allergy status to other antibiotic agents status: Secondary | ICD-10-CM | POA: Diagnosis not present

## 2014-02-25 DIAGNOSIS — Z171 Estrogen receptor negative status [ER-]: Secondary | ICD-10-CM | POA: Diagnosis not present

## 2014-02-25 DIAGNOSIS — F419 Anxiety disorder, unspecified: Secondary | ICD-10-CM | POA: Diagnosis not present

## 2014-02-25 DIAGNOSIS — C50911 Malignant neoplasm of unspecified site of right female breast: Secondary | ICD-10-CM | POA: Diagnosis not present

## 2014-02-25 DIAGNOSIS — Z79899 Other long term (current) drug therapy: Secondary | ICD-10-CM | POA: Diagnosis not present

## 2014-02-25 DIAGNOSIS — F17210 Nicotine dependence, cigarettes, uncomplicated: Secondary | ICD-10-CM | POA: Diagnosis not present

## 2014-02-25 LAB — BASIC METABOLIC PANEL
Anion gap: 16 — ABNORMAL HIGH (ref 5–15)
BUN: 17 mg/dL (ref 6–23)
CALCIUM: 9.5 mg/dL (ref 8.4–10.5)
CO2: 24 mEq/L (ref 19–32)
CREATININE: 1.19 mg/dL — AB (ref 0.50–1.10)
Chloride: 100 mEq/L (ref 96–112)
GFR calc Af Amer: 54 mL/min — ABNORMAL LOW (ref >90)
GFR, EST NON AFRICAN AMERICAN: 46 mL/min — AB (ref >90)
GLUCOSE: 126 mg/dL — AB (ref 70–99)
Potassium: 3.9 mEq/L (ref 3.7–5.3)
Sodium: 140 mEq/L (ref 137–147)

## 2014-02-26 ENCOUNTER — Encounter: Payer: Self-pay | Admitting: Hematology and Oncology

## 2014-02-27 ENCOUNTER — Ambulatory Visit (HOSPITAL_BASED_OUTPATIENT_CLINIC_OR_DEPARTMENT_OTHER)
Admission: RE | Admit: 2014-02-27 | Discharge: 2014-02-27 | Disposition: A | Discharge status: Home / Self Care | Payer: Medicare Other | Source: Ambulatory Visit | Attending: General Surgery | Admitting: General Surgery

## 2014-02-27 ENCOUNTER — Ambulatory Visit (HOSPITAL_COMMUNITY): Payer: Medicare Other

## 2014-02-27 ENCOUNTER — Encounter (HOSPITAL_BASED_OUTPATIENT_CLINIC_OR_DEPARTMENT_OTHER): Payer: Medicare Other | Admitting: Anesthesiology

## 2014-02-27 ENCOUNTER — Ambulatory Visit (HOSPITAL_BASED_OUTPATIENT_CLINIC_OR_DEPARTMENT_OTHER): Payer: Medicare Other | Admitting: Anesthesiology

## 2014-02-27 ENCOUNTER — Other Ambulatory Visit: Payer: Self-pay

## 2014-02-27 ENCOUNTER — Encounter (HOSPITAL_COMMUNITY)
Admission: RE | Admit: 2014-02-27 | Discharge: 2014-02-27 | Disposition: A | Discharge status: Home / Self Care | Payer: Medicare Other | Source: Ambulatory Visit | Attending: General Surgery | Admitting: General Surgery

## 2014-02-27 ENCOUNTER — Encounter (HOSPITAL_BASED_OUTPATIENT_CLINIC_OR_DEPARTMENT_OTHER)
Admission: RE | Disposition: A | Discharge status: Home / Self Care | Payer: Self-pay | Source: Ambulatory Visit | Attending: General Surgery

## 2014-02-27 ENCOUNTER — Encounter (HOSPITAL_BASED_OUTPATIENT_CLINIC_OR_DEPARTMENT_OTHER): Payer: Self-pay

## 2014-02-27 DIAGNOSIS — C50911 Malignant neoplasm of unspecified site of right female breast: Secondary | ICD-10-CM | POA: Diagnosis not present

## 2014-02-27 DIAGNOSIS — E780 Pure hypercholesterolemia: Secondary | ICD-10-CM | POA: Insufficient documentation

## 2014-02-27 DIAGNOSIS — Z885 Allergy status to narcotic agent status: Secondary | ICD-10-CM | POA: Insufficient documentation

## 2014-02-27 DIAGNOSIS — Z171 Estrogen receptor negative status [ER-]: Secondary | ICD-10-CM | POA: Diagnosis not present

## 2014-02-27 DIAGNOSIS — Z95828 Presence of other vascular implants and grafts: Secondary | ICD-10-CM

## 2014-02-27 DIAGNOSIS — F419 Anxiety disorder, unspecified: Secondary | ICD-10-CM | POA: Diagnosis not present

## 2014-02-27 DIAGNOSIS — R921 Mammographic calcification found on diagnostic imaging of breast: Secondary | ICD-10-CM | POA: Diagnosis not present

## 2014-02-27 DIAGNOSIS — F17210 Nicotine dependence, cigarettes, uncomplicated: Secondary | ICD-10-CM | POA: Insufficient documentation

## 2014-02-27 DIAGNOSIS — R079 Chest pain, unspecified: Secondary | ICD-10-CM | POA: Diagnosis not present

## 2014-02-27 DIAGNOSIS — Z452 Encounter for adjustment and management of vascular access device: Secondary | ICD-10-CM | POA: Diagnosis not present

## 2014-02-27 DIAGNOSIS — I10 Essential (primary) hypertension: Secondary | ICD-10-CM | POA: Insufficient documentation

## 2014-02-27 DIAGNOSIS — Z881 Allergy status to other antibiotic agents status: Secondary | ICD-10-CM | POA: Insufficient documentation

## 2014-02-27 DIAGNOSIS — Z79899 Other long term (current) drug therapy: Secondary | ICD-10-CM | POA: Insufficient documentation

## 2014-02-27 DIAGNOSIS — F329 Major depressive disorder, single episode, unspecified: Secondary | ICD-10-CM | POA: Insufficient documentation

## 2014-02-27 DIAGNOSIS — G8918 Other acute postprocedural pain: Secondary | ICD-10-CM | POA: Diagnosis not present

## 2014-02-27 DIAGNOSIS — Z7982 Long term (current) use of aspirin: Secondary | ICD-10-CM | POA: Insufficient documentation

## 2014-02-27 HISTORY — PX: RADIOACTIVE SEED GUIDED PARTIAL MASTECTOMY WITH AXILLARY SENTINEL LYMPH NODE BIOPSY: SHX6520

## 2014-02-27 HISTORY — DX: Adverse effect of unspecified anesthetic, initial encounter: T41.45XA

## 2014-02-27 HISTORY — PX: PORTACATH PLACEMENT: SHX2246

## 2014-02-27 HISTORY — DX: Other complications of anesthesia, initial encounter: T88.59XA

## 2014-02-27 HISTORY — DX: Presence of spectacles and contact lenses: Z97.3

## 2014-02-27 HISTORY — DX: Presence of dental prosthetic device (complete) (partial): Z97.2

## 2014-02-27 LAB — POCT HEMOGLOBIN-HEMACUE: HEMOGLOBIN: 15.2 g/dL — AB (ref 12.0–15.0)

## 2014-02-27 SURGERY — RADIOACTIVE SEED GUIDED PARTIAL MASTECTOMY WITH AXILLARY SENTINEL LYMPH NODE BIOPSY
Anesthesia: General | Site: Breast | Laterality: Right

## 2014-02-27 MED ORDER — LACTATED RINGERS IV SOLN
INTRAVENOUS | Status: DC
  Administered 2014-02-27 (×3): via INTRAVENOUS

## 2014-02-27 MED ORDER — CEFAZOLIN SODIUM-DEXTROSE 2-3 GM-% IV SOLR
2.0000 g | INTRAVENOUS | Status: AC
  Administered 2014-02-27: 2 g via INTRAVENOUS

## 2014-02-27 MED ORDER — OXYCODONE-ACETAMINOPHEN 5-325 MG PO TABS: 1.0000 | ORAL_TABLET | ORAL | Status: DC | PRN

## 2014-02-27 MED ORDER — BUPIVACAINE-EPINEPHRINE (PF) 0.5% -1:200000 IJ SOLN
INTRAMUSCULAR | Status: AC
  Filled 2014-02-27: qty 30

## 2014-02-27 MED ORDER — HEPARIN (PORCINE) IN NACL 2-0.9 UNIT/ML-% IJ SOLN
INTRAMUSCULAR | Status: DC | PRN
  Administered 2014-02-27: 1 via INTRAVENOUS

## 2014-02-27 MED ORDER — PROPOFOL 10 MG/ML IV BOLUS
INTRAVENOUS | Status: DC | PRN
  Administered 2014-02-27: 40 mg via INTRAVENOUS
  Administered 2014-02-27: 100 mg via INTRAVENOUS

## 2014-02-27 MED ORDER — BUPIVACAINE-EPINEPHRINE (PF) 0.5% -1:200000 IJ SOLN
INTRAMUSCULAR | Status: DC | PRN
  Administered 2014-02-27: 25 mL

## 2014-02-27 MED ORDER — MIDAZOLAM HCL 2 MG/2ML IJ SOLN
INTRAMUSCULAR | Status: AC
  Filled 2014-02-27: qty 2

## 2014-02-27 MED ORDER — CEFAZOLIN SODIUM-DEXTROSE 2-3 GM-% IV SOLR
INTRAVENOUS | Status: AC
  Filled 2014-02-27: qty 50

## 2014-02-27 MED ORDER — OXYCODONE HCL 5 MG PO TABS: 5.0000 mg | ORAL_TABLET | Freq: Once | ORAL | Status: DC | PRN

## 2014-02-27 MED ORDER — DEXAMETHASONE SODIUM PHOSPHATE 4 MG/ML IJ SOLN
INTRAMUSCULAR | Status: DC | PRN
  Administered 2014-02-27: 10 mg via INTRAVENOUS

## 2014-02-27 MED ORDER — SODIUM CHLORIDE 0.9 % IJ SOLN
INTRAMUSCULAR | Status: AC
  Filled 2014-02-27: qty 10

## 2014-02-27 MED ORDER — TECHNETIUM TC 99M SULFUR COLLOID FILTERED
1.0000 | Freq: Once | INTRAVENOUS | Status: AC | PRN
Start: 2014-02-27 — End: 2014-02-27
  Administered 2014-02-27: 1 via INTRADERMAL

## 2014-02-27 MED ORDER — ACETAMINOPHEN 650 MG RE SUPP: 650.0000 mg | RECTAL | Status: DC | PRN

## 2014-02-27 MED ORDER — MIDAZOLAM HCL 2 MG/2ML IJ SOLN
1.0000 mg | INTRAMUSCULAR | Status: DC | PRN
  Administered 2014-02-27: 2 mg via INTRAVENOUS

## 2014-02-27 MED ORDER — HYDROMORPHONE HCL 1 MG/ML IJ SOLN
0.2500 mg | INTRAMUSCULAR | Status: DC | PRN
  Administered 2014-02-27: 0.25 mg via INTRAVENOUS

## 2014-02-27 MED ORDER — HYDROMORPHONE HCL 1 MG/ML IJ SOLN
INTRAMUSCULAR | Status: AC
  Filled 2014-02-27: qty 1

## 2014-02-27 MED ORDER — BUPIVACAINE HCL (PF) 0.25 % IJ SOLN
INTRAMUSCULAR | Status: AC
  Filled 2014-02-27: qty 30

## 2014-02-27 MED ORDER — PROPOFOL 10 MG/ML IV BOLUS
INTRAVENOUS | Status: AC
  Filled 2014-02-27: qty 20

## 2014-02-27 MED ORDER — FENTANYL CITRATE 0.05 MG/ML IJ SOLN
INTRAMUSCULAR | Status: AC
  Filled 2014-02-27: qty 2

## 2014-02-27 MED ORDER — BUPIVACAINE HCL (PF) 0.25 % IJ SOLN
INTRAMUSCULAR | Status: DC | PRN
  Administered 2014-02-27: 25 mL

## 2014-02-27 MED ORDER — HEPARIN (PORCINE) IN NACL 2-0.9 UNIT/ML-% IJ SOLN
INTRAMUSCULAR | Status: AC
  Filled 2014-02-27: qty 500

## 2014-02-27 MED ORDER — FENTANYL CITRATE 0.05 MG/ML IJ SOLN
INTRAMUSCULAR | Status: AC
  Filled 2014-02-27: qty 4

## 2014-02-27 MED ORDER — EPHEDRINE SULFATE 50 MG/ML IJ SOLN
INTRAMUSCULAR | Status: DC | PRN
  Administered 2014-02-27 (×8): 10 mg via INTRAVENOUS

## 2014-02-27 MED ORDER — SODIUM CHLORIDE 0.9 % IV SOLN: 250.0000 mL | INTRAVENOUS | Status: DC | PRN

## 2014-02-27 MED ORDER — PROMETHAZINE HCL 25 MG/ML IJ SOLN
INTRAMUSCULAR | Status: AC
  Filled 2014-02-27: qty 1

## 2014-02-27 MED ORDER — OXYCODONE HCL 5 MG PO TABS: 5.0000 mg | ORAL_TABLET | ORAL | Status: DC | PRN

## 2014-02-27 MED ORDER — HEPARIN SOD (PORK) LOCK FLUSH 100 UNIT/ML IV SOLN
INTRAVENOUS | Status: AC
  Filled 2014-02-27: qty 5

## 2014-02-27 MED ORDER — HEPARIN SOD (PORK) LOCK FLUSH 100 UNIT/ML IV SOLN
INTRAVENOUS | Status: DC | PRN
  Administered 2014-02-27: 500 [IU] via INTRAVENOUS

## 2014-02-27 MED ORDER — ACETAMINOPHEN 325 MG PO TABS: 650.0000 mg | ORAL_TABLET | ORAL | Status: DC | PRN

## 2014-02-27 MED ORDER — SODIUM CHLORIDE 0.9 % IJ SOLN: 3.0000 mL | INTRAMUSCULAR | Status: DC | PRN

## 2014-02-27 MED ORDER — OXYCODONE HCL 5 MG/5ML PO SOLN: 5.0000 mg | Freq: Once | ORAL | Status: DC | PRN

## 2014-02-27 MED ORDER — METHYLENE BLUE 1 % INJ SOLN
INTRAMUSCULAR | Status: AC
  Filled 2014-02-27: qty 10

## 2014-02-27 MED ORDER — ONDANSETRON HCL 4 MG/2ML IJ SOLN
INTRAMUSCULAR | Status: DC | PRN
  Administered 2014-02-27: 4 mg via INTRAVENOUS

## 2014-02-27 MED ORDER — FENTANYL CITRATE 0.05 MG/ML IJ SOLN
INTRAMUSCULAR | Status: DC | PRN
  Administered 2014-02-27 (×2): 25 ug via INTRAVENOUS

## 2014-02-27 MED ORDER — LIDOCAINE HCL (CARDIAC) 20 MG/ML IV SOLN
INTRAVENOUS | Status: DC | PRN
  Administered 2014-02-27: 20 mg via INTRAVENOUS

## 2014-02-27 MED ORDER — PROMETHAZINE HCL 25 MG/ML IJ SOLN
6.2500 mg | INTRAMUSCULAR | Status: DC | PRN
  Administered 2014-02-27: 6.25 mg via INTRAVENOUS

## 2014-02-27 MED ORDER — SODIUM CHLORIDE 0.9 % IJ SOLN: 3.0000 mL | Freq: Two times a day (BID) | INTRAMUSCULAR | Status: DC

## 2014-02-27 MED ORDER — FENTANYL CITRATE 0.05 MG/ML IJ SOLN
50.0000 ug | INTRAMUSCULAR | Status: DC | PRN
Start: 2014-02-27 — End: 2014-02-27
  Administered 2014-02-27: 100 ug via INTRAVENOUS

## 2014-02-27 SURGICAL SUPPLY — 78 items
ADH SKN CLS APL DERMABOND .7 (GAUZE/BANDAGES/DRESSINGS) ×2
APPLIER CLIP 9.375 MED OPEN (MISCELLANEOUS)
APR CLP MED 9.3 20 MLT OPN (MISCELLANEOUS)
BAG DECANTER FOR FLEXI CONT (MISCELLANEOUS) ×4 IMPLANT
BINDER BREAST LRG (GAUZE/BANDAGES/DRESSINGS) IMPLANT
BINDER BREAST MEDIUM (GAUZE/BANDAGES/DRESSINGS) IMPLANT
BINDER BREAST XLRG (GAUZE/BANDAGES/DRESSINGS) IMPLANT
BINDER BREAST XXLRG (GAUZE/BANDAGES/DRESSINGS) ×2 IMPLANT
BLADE HEX COATED 2.75 (ELECTRODE) ×4 IMPLANT
BLADE SURG 10 STRL SS (BLADE) ×4 IMPLANT
BLADE SURG 11 STRL SS (BLADE) ×4 IMPLANT
BLADE SURG 15 STRL LF DISP TIS (BLADE) ×2 IMPLANT
BLADE SURG 15 STRL SS (BLADE) ×4
BNDG COHESIVE 4X5 TAN STRL (GAUZE/BANDAGES/DRESSINGS) ×4 IMPLANT
CANISTER SUC SOCK COL 7IN (MISCELLANEOUS) IMPLANT
CANISTER SUCT 1200ML W/VALVE (MISCELLANEOUS) IMPLANT
CHLORAPREP W/TINT 26ML (MISCELLANEOUS) ×4 IMPLANT
CLIP APPLIE 9.375 MED OPEN (MISCELLANEOUS) IMPLANT
CLIP TI LARGE 6 (CLIP) ×4 IMPLANT
CLIP TI MEDIUM 6 (CLIP) ×4 IMPLANT
CLIP TI WIDE RED SMALL 6 (CLIP) IMPLANT
CLOSURE WOUND 1/2 X4 (GAUZE/BANDAGES/DRESSINGS) ×2
COVER BACK TABLE 60X90IN (DRAPES) ×4 IMPLANT
COVER MAYO STAND STRL (DRAPES) ×4 IMPLANT
COVER PROBE W GEL 5X96 (DRAPES) ×4 IMPLANT
DECANTER SPIKE VIAL GLASS SM (MISCELLANEOUS) ×2 IMPLANT
DERMABOND ADVANCED (GAUZE/BANDAGES/DRESSINGS) ×2
DERMABOND ADVANCED .7 DNX12 (GAUZE/BANDAGES/DRESSINGS) ×2 IMPLANT
DEVICE DUBIN W/COMP PLATE 8390 (MISCELLANEOUS) ×4 IMPLANT
DRAPE C-ARM 42X72 X-RAY (DRAPES) ×4 IMPLANT
DRAPE LAPAROTOMY TRNSV 102X78 (DRAPE) ×4 IMPLANT
DRAPE UNIVERSAL PACK (DRAPES) ×4 IMPLANT
DRAPE UTILITY XL STRL (DRAPES) ×4 IMPLANT
DRSG PAD ABDOMINAL 8X10 ST (GAUZE/BANDAGES/DRESSINGS) IMPLANT
DRSG TEGADERM 4X4.75 (GAUZE/BANDAGES/DRESSINGS) IMPLANT
ELECT BLADE 4.0 EZ CLEAN MEGAD (MISCELLANEOUS) ×4
ELECT REM PT RETURN 9FT ADLT (ELECTROSURGICAL) ×4
ELECTRODE BLDE 4.0 EZ CLN MEGD (MISCELLANEOUS) IMPLANT
ELECTRODE REM PT RTRN 9FT ADLT (ELECTROSURGICAL) ×2 IMPLANT
GLOVE BIO SURGEON STRL SZ 6 (GLOVE) ×4 IMPLANT
GLOVE BIOGEL PI IND STRL 6.5 (GLOVE) ×2 IMPLANT
GLOVE BIOGEL PI INDICATOR 6.5 (GLOVE) ×2
GOWN STRL REUS W/ TWL LRG LVL3 (GOWN DISPOSABLE) ×2 IMPLANT
GOWN STRL REUS W/TWL 2XL LVL3 (GOWN DISPOSABLE) ×4 IMPLANT
GOWN STRL REUS W/TWL LRG LVL3 (GOWN DISPOSABLE) ×4
KIT MARKER MARGIN INK (KITS) ×4 IMPLANT
KIT PORT POWER 8FR ISP CVUE (Catheter) ×2 IMPLANT
NDL HYPO 25X1 1.5 SAFETY (NEEDLE) ×2 IMPLANT
NEEDLE HYPO 25X1 1.5 SAFETY (NEEDLE) ×8 IMPLANT
NS IRRIG 1000ML POUR BTL (IV SOLUTION) IMPLANT
PACK BASIN DAY SURGERY FS (CUSTOM PROCEDURE TRAY) ×4 IMPLANT
PENCIL BUTTON HOLSTER BLD 10FT (ELECTRODE) ×4 IMPLANT
SHEATH COOK PEEL AWAY SET 9F (SHEATH) ×2 IMPLANT
SHEET MEDIUM DRAPE 40X70 STRL (DRAPES) IMPLANT
SLEEVE SCD COMPRESS KNEE MED (MISCELLANEOUS) ×4 IMPLANT
SPONGE GAUZE 4X4 12PLY STER LF (GAUZE/BANDAGES/DRESSINGS) ×4 IMPLANT
SPONGE LAP 18X18 X RAY DECT (DISPOSABLE) ×4 IMPLANT
STOCKINETTE IMPERVIOUS LG (DRAPES) ×4 IMPLANT
STRIP CLOSURE SKIN 1/2X4 (GAUZE/BANDAGES/DRESSINGS) ×4 IMPLANT
SUT ETHILON 2 0 FS 18 (SUTURE) IMPLANT
SUT MNCRL AB 4-0 PS2 18 (SUTURE) ×8 IMPLANT
SUT MON AB 5-0 PS2 18 (SUTURE) IMPLANT
SUT PROLENE 2 0 SH DA (SUTURE) ×8 IMPLANT
SUT SILK 2 0 SH (SUTURE) IMPLANT
SUT VIC AB 2-0 SH 27 (SUTURE) ×4
SUT VIC AB 2-0 SH 27XBRD (SUTURE) ×2 IMPLANT
SUT VIC AB 3-0 SH 27 (SUTURE) ×4
SUT VIC AB 3-0 SH 27X BRD (SUTURE) ×2 IMPLANT
SUT VIC AB 5-0 PS2 18 (SUTURE) IMPLANT
SUT VICRYL 3-0 CR8 SH (SUTURE) IMPLANT
SYR 5ML LUER SLIP (SYRINGE) ×4 IMPLANT
SYR CONTROL 10ML LL (SYRINGE) ×4 IMPLANT
SYRINGE 10CC LL (SYRINGE) ×4 IMPLANT
TOWEL OR 17X24 6PK STRL BLUE (TOWEL DISPOSABLE) ×6 IMPLANT
TOWEL OR NON WOVEN STRL DISP B (DISPOSABLE) ×4 IMPLANT
TUBE CONNECTING 20'X1/4 (TUBING) ×1
TUBE CONNECTING 20X1/4 (TUBING) ×3 IMPLANT
YANKAUER SUCT BULB TIP NO VENT (SUCTIONS) ×2 IMPLANT

## 2014-02-27 NOTE — Transfer of Care (Signed)
Immediate Anesthesia Transfer of Care Note  Patient: Cheyenne Riggs  Procedure(s) Performed: Procedure(s): RADIOACTIVE SEED GUIDED RIGHT PARTIAL MASTECTOMY WITH AXILLARY SENTINEL LYMPH NODE BIOPSY (Right) INSERTION PORT-A-CATH (N/A)  Patient Location: PACU  Anesthesia Type:GA combined with regional for post-op pain  Level of Consciousness: awake, alert , sedated and patient cooperative  Airway & Oxygen Therapy: Patient Spontanous Breathing and Patient connected to face mask oxygen  Post-op Assessment: Report given to PACU RN and Post -op Vital signs reviewed and stable  Post vital signs: Reviewed and stable  Complications: No apparent anesthesia complications

## 2014-02-27 NOTE — Progress Notes (Signed)
Emotional support during breast injections °

## 2014-02-27 NOTE — Discharge Instructions (Addendum)
[facility] °Office Phone Number [phone] ° °BREAST BIOPSY/ PARTIAL MASTECTOMY: POST OP INSTRUCTIONS ° °Always review your discharge instruction sheet given to you by the facility where your surgery was performed. ° °IF YOU HAVE DISABILITY OR FAMILY LEAVE FORMS, YOU MUST BRING THEM TO THE OFFICE FOR PROCESSING.  DO NOT GIVE THEM TO YOUR DOCTOR. ° °1. A prescription for pain medication may be given to you upon discharge.  Take your pain medication as prescribed, if needed.  If narcotic pain medicine is not needed, then you may take acetaminophen (Tylenol) or ibuprofen (Advil) as needed. °2. Take your usually prescribed medications unless otherwise directed °3. If you need a refill on your pain medication, please contact your pharmacy.  They will contact our office to request authorization.  Prescriptions will not be filled after 5pm or on week-ends. °4. You should eat very light the first 24 hours after surgery, such as soup, crackers, pudding, etc.  Resume your normal diet the day after surgery. °5. Most patients will experience some swelling and bruising in the breast.  Ice packs and a good support bra will help.  Swelling and bruising can take several days to resolve.  °6. It is common to experience some constipation if taking pain medication after surgery.  Increasing fluid intake and taking a stool softener will usually help or prevent this problem from occurring.  A mild laxative (Milk of Magnesia or Miralax) should be taken according to package directions if there are no bowel movements after 48 hours. °7. Unless discharge instructions indicate otherwise, you may remove your bandages 48 hours after surgery, and you may shower at that time.  You may have steri-strips (small skin tapes) in place directly over the incision.  These strips should be left on the skin for 7-10 days.   Any sutures or staples will be removed at the office during your follow-up visit. °8. ACTIVITIES:  You may resume  regular daily activities (gradually increasing) beginning the next day.  Wearing a good support bra or sports bra (or the breast binder) minimizes pain and swelling.  You may have sexual intercourse when it is comfortable. °a. You may drive when you no longer are taking prescription pain medication, you can comfortably wear a seatbelt, and you can safely maneuver your car and apply brakes. °b. RETURN TO WORK:  __________1 week_______________ °9. You should see your doctor in the office for a follow-up appointment approximately two weeks after your surgery.  Your doctor’s nurse will typically make your follow-up appointment when she calls you with your pathology report.  Expect your pathology report 2-3 business days after your surgery.  You may call to check if you do not hear from us after three days. ° ° °WHEN TO CALL YOUR DOCTOR: °1. Fever over 101.0 °2. Nausea and/or vomiting. °3. Extreme swelling or bruising. °4. Continued bleeding from incision. °5. Increased pain, redness, or drainage from the incision. ° °The clinic staff is available to answer your questions during regular business hours.  Please don’t hesitate to call and ask to speak to one of the nurses for clinical concerns.  If you have a medical emergency, go to the nearest emergency room or call 911.  A surgeon from [facility] is always on call at the hospital. ° °For further questions, please visit [email or web address]  ° ° °Post Anesthesia Home Care Instructions ° °Activity: °Get plenty of rest for the remainder of the day. A responsible adult should stay with you for 24   hours following the procedure.  °For the next 24 hours, DO NOT: °-Drive a car °-Operate machinery °-Drink alcoholic beverages °-Take any medication unless instructed by your physician °-Make any legal decisions or sign important papers. ° °Meals: °Start with liquid foods such as gelatin or soup. Progress to regular foods as tolerated. Avoid greasy, spicy, heavy  foods. If nausea and/or vomiting occur, drink only clear liquids until the nausea and/or vomiting subsides. Call your physician if vomiting continues. ° °Special Instructions/Symptoms: °Your throat may feel dry or sore from the anesthesia or the breathing tube placed in your throat during surgery. If this causes discomfort, gargle with warm salt water. The discomfort should disappear within 24 hours. ° ° °

## 2014-02-27 NOTE — Anesthesia Postprocedure Evaluation (Signed)
  Anesthesia Post-op Note  Patient: Cheyenne Riggs  Procedure(s) Performed: Procedure(s): RADIOACTIVE SEED GUIDED RIGHT PARTIAL MASTECTOMY WITH AXILLARY SENTINEL LYMPH NODE BIOPSY (Right) INSERTION PORT-A-CATH (N/A)  Patient Location: PACU  Anesthesia Type:GA combined with regional for post-op pain  Level of Consciousness: awake, alert  and oriented  Airway and Oxygen Therapy: Patient Spontanous Breathing  Post-op Pain: none  Post-op Assessment: Post-op Vital signs reviewed  Post-op Vital Signs: Reviewed  Last Vitals:  Filed Vitals:   02/27/14 1345  BP: 117/71  Pulse: 98  Temp:   Resp: 14    Complications: No apparent anesthesia complications

## 2014-02-27 NOTE — Anesthesia Preprocedure Evaluation (Addendum)
Anesthesia Evaluation  Patient identified by MRN, date of birth, ID band Patient awake    Reviewed: Allergy & Precautions, H&P , NPO status , Patient's Chart, lab work & pertinent test results  Airway Mallampati: II TM Distance: >3 FB Neck ROM: Full    Dental  (+) Teeth Intact, Dental Advisory Given   Pulmonary Current Smoker,  breath sounds clear to auscultation        Cardiovascular hypertension, Pt. on medications Rhythm:Regular Rate:Normal     Neuro/Psych Anxiety Depression    GI/Hepatic negative GI ROS, Neg liver ROS,   Endo/Other  Morbid obesity  Renal/GU CRFRenal disease     Musculoskeletal negative musculoskeletal ROS (+)   Abdominal   Peds  Hematology negative hematology ROS (+)   Anesthesia Other Findings   Reproductive/Obstetrics                          Anesthesia Physical Anesthesia Plan  ASA: III  Anesthesia Plan: General   Post-op Pain Management:    Induction: Intravenous  Airway Management Planned: LMA  Additional Equipment:   Intra-op Plan:   Post-operative Plan:   Informed Consent: I have reviewed the patients History and Physical, chart, labs and discussed the procedure including the risks, benefits and alternatives for the proposed anesthesia with the patient or authorized representative who has indicated his/her understanding and acceptance.   Dental advisory given  Plan Discussed with: CRNA and Surgeon  Anesthesia Plan Comments:        Anesthesia Quick Evaluation

## 2014-02-27 NOTE — Op Note (Signed)
Right Breast seed localized Lumpectomy with Sentinel Node Biopsy Procedure Note, Placement of right subclavian port a cath  Indications: This patient presents with history of right breast cancer with clinically negative axillary lymph node exam.  Pre-operative Diagnosis: right breast cancer, cT2N0M0, triple negative  Post-operative Diagnosis: right breast cancer  Surgeon: Stark Klein   Anesthesia: General endotracheal anesthesia  ASA Class: 3  Procedure Details  The patient was seen in the Holding Room. The risks, benefits, complications, treatment options, and expected outcomes were discussed with the patient. The possibilities of bleeding, infection, the need for additional procedures, failure to diagnose a condition, and creating a complication requiring transfusion or operation were discussed with the patient. The patient concurred with the proposed plan, giving informed consent.  The site of surgery properly noted/marked. The patient was taken to Operating Room # 5, identified as Cheyenne Riggs, and the procedure verified as right Breast Lumpectomy and Sentinel Node Biopsy, right port a cath. A Time Out was held and the above information confirmed.  The right arm, breast, and bilateral chest were prepped and draped in standard fashion.   The lumpectomy was performed by creating an transverse incision over the upper outer quadrant of the breast over the previously placed radioactive seed.  Dissection was carried down to the pectoral fascia.   The edges of the cavity were marked with large clips, with one each medial, lateral, inferior and superior, and two clips posteriorly.   The specimen was inked with the margin marker paint kit.    Specimen radiography confirmed inclusion of the mammographic lesion.  Hemostasis was achieved with cautery.  The wound was irrigated and closed with 3-0 vicryl in layers and 4-0 monocryl subcuticular suture.  Using a hand-held gamma probe, R axillary sentinel  nodes were identified transcutaneously.  An oblique incision was created below the axillary hairline.  Dissection was carried through the clavipectoral fascia.  2 level 2 axillary sentinel nodes were removed.  Counts per second were 580 and 120. The background count was 3 cps.    The axillary incision was closed with a 3-0 vicryl deep dermal interrupted sutures and a 4-0 monocryl subcuticular closure.      Time-out was   performed according to the surgical safety check list.  When all was   correct, we continued.   Local anesthetic was administered over this   area at the angle of the clavicle.  The vein was accessed with 2 passes of the needle. There was good venous return but the wire would not pass into the chest.  After multiple additional tries on the left, we moved to the right.  We were able to access the right superior vena cava.    Fluoroscopy was used to confirm that the wire was in the vena cava.      The patient was placed back level and the area for the pocket was anethetized   with local anesthetic.  A 3-cm transverse incision was made with a #15   blade.  Cautery was used to divide the subcutaneous tissues down to the   pectoralis muscle.  An [employer] retractor was used to elevate the skin   while a pocket was created on top of the pectoralis fascia.  The port   was placed into the pocket to confirm that it was of adequate size.  The   catheter was preattached to the port.  The port was then secured to the   pectoralis fascia with four 2-0 Prolene sutures.  These were clamped and   not tied down yet.    The catheter was tunneled through to the wire exit   site.  The catheter was placed along the wire to determine what length it should be to be in the SVC.  The catheter was cut at 19 cm.  The tunneler sheath and dilator were attempted to be passed over the wire, but the wire kinked.  A new dilator was used and passed easily, and the dilator and wire were removed.  The catheter was  advanced through the tunneler sheath and the tunneler sheath was pulled away.  Care was taken to keep the catheter in the tunneler sheath as this occurred. This was advanced and the tunneler sheath was removed.  There was good venous   return and easy flush of the catheter.  The Prolene sutures were tied   down to the pectoral fascia.  The skin was reapproximated using 3-0   Vicryl interrupted deep dermal sutures.    Fluoroscopy was used to re-confirm good position of the catheter.  The skin   was then closed using 4-0 Monocryl in a subcuticular fashion.  The port was flushed with concentrated heparin flush as well.  The wounds were then cleaned, dried, and dressed with Dermabond.  The patient was awakened from anesthesia and taken to the PACU in stable condition.  Needle, sponge, and instrument counts were correct.     Sterile dressings were applied. At the end of the operation, all sponge, instrument, and needle counts were correct.  Findings: grossly clear surgical margins and no adenopathy  Estimated Blood Loss:  less than 50 mL         Specimens: R breast lumpectomy and 2 axillary sentinel nodes.           Complications:  None; patient tolerated the procedure well.         Disposition: PACU - hemodynamically stable.         Condition: stable  

## 2014-02-27 NOTE — Progress Notes (Signed)
Assisted Dr. Rob Fitzgerald with right, ultrasound guided, pectoralis block. Side rails up, monitors on throughout procedure. See vital signs in flow sheet. Tolerated Procedure well. 

## 2014-02-27 NOTE — H&P (Signed)
Cheyenne Riggs 02/09/2014 3:04 PM Location: [facility] Patient #: [id number] DOB: February 05, 1946 Married / Language: Cleophus Molt / Race: White Female  History of Present Illness Stark Klein MD; 02/09/2014 3:59 PM) Patient words: Breast check.  The patient is a 68 year old female who presents with breast cancer. 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 ) of the right breast (upper outer quadrant). The patient was referred by a specialty consultant (referred by Dr. Luan Pulling for consultation regarding new diagnosis). Initial presentation was 1 week(s) ago for an abnormal mammogram (1.6 cm mass detected at 10 o;clock on screening mammogram. Ultrasound was positive for 1.2 cm mass). Current diagnosis was determined by mammography and core needle biopsy. Risk factors do not include breast cancer in a first degree relative or breast cancer in a second degree relative. Pt has not had a previous breast biopsy. She has not used HRT.    Other Problems Alexander Bergeron [place], [place]; 02/09/2014 3:22 PM) Anxiety Disorder Back Pain Bladder Problems Cholelithiasis Depression High blood pressure Hypercholesterolemia Kidney Joaquim Lai  Past Surgical History Alexander Bergeron [place], [place]; 02/09/2014 3:22 PM) Breast Biopsy Right. Colon Polyp Removal - Colonoscopy Colon Polyp Removal - Open Gallbladder Surgery - Open Hysterectomy (not due to cancer) - Partial Oral Surgery Spinal Surgery - Lower Back  Diagnostic Studies History ([place], [place]; 02/09/2014 3:22 PM) Colonoscopy 1-5 years ago Mammogram within last year Pap Smear 1-5 years ago  Allergies ([place], RMA; 02/09/2014 3:27 PM) Codeine/Codeine Derivatives Demerol *ANALGESICS - OPIOID* Lidocaine *CHEMICALS* tachycardia Septra *ANTI-INFECTIVE AGENTS - MISC.* Cipro *FLUOROQUINOLONES*  Medication  History ([place], RMA; 02/09/2014 3:30 PM) Crestor (20MG  Tablet, Oral) Active. PROzac (10MG  Capsule, Oral) Active. Valsartan (320MG  Tablet, Oral) Active. Aspirin (81MG  Tablet, 1 (one) Oral) Active. Xanax (0.25MG  Tablet, Oral) Active. Tylenol (325MG  Tablet, 1 (one) Oral) Active. Probiotic Daily (Oral) Active. Zantac (150MG  Tablet, Oral) Active.  Social History ([place], [place]; 02/09/2014 3:22 PM) Caffeine use Coffee, Tea. No alcohol use No drug use Tobacco use Current every day smoker.  Family History Alexander Bergeron [place], [place]; 02/09/2014 3:22 PM) Alcohol Abuse Father. Cerebrovascular Accident Mother. Heart Disease Brother. Hypertension Mother, Sister. Melanoma Father. Respiratory Condition Father.  Pregnancy / Birth History Alexander Bergeron [place], [place]; 02/09/2014 3:22 PM) Age at menarche 71 years. Age of menopause 51-55 Contraceptive History Oral contraceptives. Gravida 1 Maternal age 6-20 Para 1  Review of Systems ([place] RMA; 02/09/2014 3:22 PM) General Not Present- Appetite Loss, Chills, Fatigue, Fever, Night Sweats, Weight Gain and Weight Loss. Skin Not Present- Change in Wart/Mole, Dryness, Hives, Jaundice, New Lesions, Non-Healing Wounds, Rash and Ulcer. HEENT Present- Seasonal Allergies. Not Present- Earache, Hearing Loss, Hoarseness, Nose Bleed, Oral Ulcers, Ringing in the Ears, Sinus Pain, Sore Throat, Visual Disturbances, Wears glasses/contact lenses and Yellow Eyes. Respiratory Present- Snoring. Not Present- Bloody sputum, Chronic Cough, Difficulty Breathing and Wheezing. Breast Present- Breast Mass. Not Present- Breast Pain, Nipple Discharge and Skin Changes. Cardiovascular Not Present- Chest Pain, Difficulty Breathing Lying Down, Leg Cramps, Palpitations, Rapid Heart Rate, Shortness of Breath and Swelling of Extremities. Gastrointestinal Not Present- Abdominal Pain, Bloating, Bloody Stool, Change in  Bowel Habits, Chronic diarrhea, Constipation, Difficulty Swallowing, Excessive gas, Gets full quickly at meals, Hemorrhoids, Indigestion, Nausea, Rectal Pain and Vomiting. Female Genitourinary Not Present- Frequency, Nocturia, Painful Urination, Pelvic Pain and Urgency. Musculoskeletal Present- Muscle Pain. Not Present- Back Pain, Joint Pain, Joint Stiffness, Muscle Weakness and Swelling of Extremities.  Neurological Not Present- Decreased Memory, Fainting, Headaches, Numbness, Seizures, Tingling, Tremor, Trouble walking and Weakness. Psychiatric Present- Anxiety and Depression. Not Present- Bipolar, Change in Sleep Pattern, Fearful and Frequent crying. Endocrine Not Present- Cold Intolerance, Excessive Hunger, Hair Changes, Heat Intolerance, Hot flashes and New Diabetes. Hematology Present- Easy Bruising. Not Present- Excessive bleeding, Gland problems, HIV and Persistent Infections.   Vitals ([place] Maldonado RMA; 02/09/2014 3:24 PM) 02/09/2014 3:22 PM Weight: 207.6 lb Height: 63in Body Surface Area: 2.05 m Body Mass Index: 36.77 kg/m Temp.: 97.26F(Oral)  Pulse: 92 (Regular)  P.OX: 97% (Room air) BP: 162/86 (Sitting, Left Arm, Standard)    Physical Exam Stark Klein MD; 02/09/2014 3:57 PM) General Mental Status-Alert. General Appearance-Consistent with stated age. Hydration-Well hydrated. Voice-Normal.  Head and Neck Head-normocephalic, atraumatic with no lesions or palpable masses. Trachea-midline. Thyroid Gland Characteristics - normal size and consistency.  Eye Eyeball - Bilateral-Extraocular movements intact. Sclera/Conjunctiva - Bilateral-No scleral icterus.  Chest and Lung Exam Chest and lung exam reveals -quiet, even and easy respiratory effort with no use of accessory muscles and on auscultation, normal breath sounds, no adventitious sounds and normal vocal resonance. Inspection Chest Wall - Normal. Back - normal.  Breast Note:  right breast with faint bruising on right upper outer quadrant. No palpable masses or skin dimpling. no nipple retraction or nipple discharge. No lymphadenopathy. No scars seen.   Cardiovascular Cardiovascular examination reveals -normal heart sounds, regular rate and rhythm with no murmurs and normal pedal pulses bilaterally.  Abdomen Inspection Inspection of the abdomen reveals - No Hernias. Palpation/Percussion Palpation and Percussion of the abdomen reveal - Soft, Non Tender, No Rebound tenderness, No Rigidity (guarding) and No hepatosplenomegaly. Auscultation Auscultation of the abdomen reveals - Bowel sounds normal.  Neurologic Neurologic evaluation reveals -alert and oriented x 3 with no impairment of recent or remote memory. Mental Status-Normal.  Musculoskeletal Global Assessment -Note: no gross deformities.  Normal Exam - Left-Upper Extremity Strength Normal and Lower Extremity Strength Normal. Normal Exam - Right-Upper Extremity Strength Normal and Lower Extremity Strength Normal.  Lymphatic Head & Neck  General Head & Neck Lymphatics: Bilateral - Description - Normal. Axillary  General Axillary Region: Bilateral - Description - Normal. Tenderness - Non Tender. Femoral & Inguinal  Generalized Femoral & Inguinal Lymphatics: Bilateral - Description - No Generalized lymphadenopathy.    Assessment & Plan Stark Klein MD; 02/09/2014 3:59 PM) PRIMARY CANCER OF UPPER OUTER QUADRANT OF RIGHT BREAST (174.4  C50.411) Impression: Will plan breast conservation therapy. Will do right seed localized lumpectomy wtih sentinel lymph node biopsy. She may need a port if her her 2 status is positive. We should find this out tomorrow.  Reviewed risks of surgery including bleeding, infection, damage to adjacent structures, seroma, pain, asymmetry, possible need for additional procedures, possible heart or lung problems.  Pt understands and wishes to proceed. Current  Plans  Schedule for Surgery Pt Education - CSS Breast Biopsy Instructions (FLB): discussed with patient and provided information. Referred to Oncology, for evaluation and follow up (Oncology). Referred to Radiation Oncology, for evaluation and follow up (Radiation Oncology).   Signed by Stark Klein, MD (02/09/2014 4:01 PM)

## 2014-02-27 NOTE — Anesthesia Procedure Notes (Addendum)
Anesthesia Regional Block:  Pectoralis block  Pre-Anesthetic Checklist: ,, timeout performed, Correct Patient, Correct Site, Correct Laterality, Correct Procedure, Correct Position, site marked, Risks and benefits discussed,  Surgical consent,  Pre-op evaluation,  At surgeon's request and post-op pain management  Laterality: Right  Prep: chloraprep       Needles:  Injection technique: Single-shot  Needle Type: Echogenic Needle     Needle Length: 9cm 9 cm Needle Gauge: 21 and 21 G    Additional Needles:  Procedures: ultrasound guided (picture in chart) Pectoralis block Narrative:  Start time: 02/27/2014 10:00 AM End time: 02/27/2014 10:10 AM Injection made incrementally with aspirations every 5 mL.  Performed by: Personally  Anesthesiologist: Suzette Battiest, MD  Additional Notes: Pt prepped and draped in sterile fashion. 4th rib identified on u/s using linear probe. Needle advanced under live u/s guidance and LA injected in real time just over surface of 4th rib. Pt tolerated the procedure well without immediate complications. Total of 25cc's 0.5% bupivicaine with 1:200k epi.   Procedure Name: LMA Insertion Date/Time: 02/27/2014 10:25 AM Performed by: Lyndee Leo Pre-anesthesia Checklist: Patient identified, Emergency Drugs available, Suction available and Patient being monitored Patient Re-evaluated:Patient Re-evaluated prior to inductionOxygen Delivery Method: Circle System Utilized Preoxygenation: Pre-oxygenation with 100% oxygen Intubation Type: IV induction Ventilation: Mask ventilation without difficulty LMA: LMA inserted LMA Size: 4.0 Number of attempts: 1 Airway Equipment and Method: bite block Placement Confirmation: positive ETCO2 Tube secured with: Tape Dental Injury: Teeth and Oropharynx as per pre-operative assessment

## 2014-03-03 ENCOUNTER — Encounter (HOSPITAL_BASED_OUTPATIENT_CLINIC_OR_DEPARTMENT_OTHER): Payer: Self-pay | Admitting: General Surgery

## 2014-03-04 ENCOUNTER — Other Ambulatory Visit: Payer: Medicare Other

## 2014-03-04 ENCOUNTER — Ambulatory Visit (HOSPITAL_COMMUNITY)
Admission: RE | Admit: 2014-03-04 | Discharge: 2014-03-04 | Disposition: A | Discharge status: Home / Self Care | Payer: Medicare Other | Source: Ambulatory Visit | Attending: Hematology and Oncology | Admitting: Hematology and Oncology

## 2014-03-04 ENCOUNTER — Telehealth (INDEPENDENT_AMBULATORY_CARE_PROVIDER_SITE_OTHER): Payer: Self-pay

## 2014-03-04 DIAGNOSIS — Z136 Encounter for screening for cardiovascular disorders: Secondary | ICD-10-CM | POA: Insufficient documentation

## 2014-03-04 DIAGNOSIS — I517 Cardiomegaly: Secondary | ICD-10-CM | POA: Diagnosis not present

## 2014-03-04 DIAGNOSIS — C50411 Malignant neoplasm of upper-outer quadrant of right female breast: Secondary | ICD-10-CM

## 2014-03-04 NOTE — Progress Notes (Signed)
  Echocardiogram 2D Echocardiogram has been performed.  Cheyenne Riggs 03/04/2014, 9:55 AM

## 2014-03-04 NOTE — Telephone Encounter (Signed)
Pt made aware all margins clear and no cancer in lymph nodes.  Post op appt with Dr. Barry Dienes made for 03/27/14 at 12:00 noon.

## 2014-03-09 DIAGNOSIS — Z23 Encounter for immunization: Secondary | ICD-10-CM | POA: Diagnosis not present

## 2014-03-12 ENCOUNTER — Ambulatory Visit (HOSPITAL_BASED_OUTPATIENT_CLINIC_OR_DEPARTMENT_OTHER): Payer: Medicare Other | Admitting: Hematology and Oncology

## 2014-03-12 ENCOUNTER — Telehealth: Payer: Self-pay | Admitting: Hematology and Oncology

## 2014-03-12 ENCOUNTER — Other Ambulatory Visit: Payer: Self-pay | Admitting: *Deleted

## 2014-03-12 ENCOUNTER — Telehealth: Payer: Self-pay | Admitting: *Deleted

## 2014-03-12 VITALS — BP 127/77 | HR 101 | Temp 98.2°F | Resp 18 | Ht 63.0 in | Wt 205.6 lb

## 2014-03-12 DIAGNOSIS — C50411 Malignant neoplasm of upper-outer quadrant of right female breast: Secondary | ICD-10-CM

## 2014-03-12 DIAGNOSIS — Z171 Estrogen receptor negative status [ER-]: Secondary | ICD-10-CM | POA: Diagnosis not present

## 2014-03-12 NOTE — Telephone Encounter (Signed)
Per staff message and POF I have scheduled appts. Advised scheduler of appts. JMW  

## 2014-03-12 NOTE — Assessment & Plan Note (Signed)
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  I recommended adjuvant systemic chemotherapy with dose dense Adriamycin Cytoxan x4 followed by Abraxane weekly x12 (Robaxin was chosen because of prior neuropathy). Patient had an echocardiogram which was normal. Chemotherapy class was also completed. She will start chemotherapy on November 16th. Patient was counseled extensively regarding chemotherapy and appears to have a good understanding. 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  Return to clinic in November 16 for start of treatment.

## 2014-03-12 NOTE — Progress Notes (Signed)
Patient Care Team: Hulan Fess, MD as PCP - General (Family Medicine)  DIAGNOSIS: Breast cancer of upper-outer quadrant of right female breast   Primary site: Breast (Right)   Staging method: AJCC 7th Edition   Clinical: Stage IA (T1c, N0, cM0) signed by Thea Silversmith, MD on 02/18/2014  5:54 PM   Pathologic: Stage IIA (T2, N0, cM0) signed by Rulon Eisenmenger, MD on 03/12/2014 12:05 PM   Summary: Stage IIA (T2, N0, cM0)   SUMMARY OF ONCOLOGIC HISTORY:   Breast cancer of upper-outer quadrant of right female breast   02/27/2014 Surgery Right breast lumpectomy: Invasive ductal carcinoma grade 3, 2.5 cm, DCIS intermediate grade, margins negative, 2 SLN negative, ER/PR HER-2 negative    CHIEF COMPLIANT: Postop followup  INTERVAL HISTORY: 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 Final pathology revealed a 2.5 cm tumor which was ER/PR HER-2 negative. She is yet to discuss adjuvant treatment plan. Previous to the surgery, I discussed that the triple negative, she will need chemotherapy. She had been through chemotherapy counseling class and echocardiogram. She has a port placed and it is healing very well.  REVIEW OF SYSTEMS:   Constitutional: Denies fevers, chills or abnormal weight loss Eyes: Denies blurriness of vision Ears, nose, mouth, throat, and face: Denies mucositis or sore throat Respiratory: Denies cough, dyspnea or wheezes Cardiovascular: Denies palpitation, chest discomfort or lower extremity swelling Gastrointestinal:  Denies nausea, heartburn or change in bowel habits Skin: Denies abnormal skin rashes Lymphatics: Denies new lymphadenopathy or easy bruising Neurological:Denies numbness, tingling or new weaknesses Behavioral/Psych: Mood is stable, no new changes  Breast: Healing very well from the recent surgery All other systems were reviewed with  the patient and are negative.  I have reviewed the past medical history, past surgical history, social history and family history with the patient and they are unchanged from previous note.  ALLERGIES:  is allergic to ciprofloxacin; demerol; lidocaine; other; septra; codeine; and novocain.  MEDICATIONS:  Current Outpatient Prescriptions  Medication Sig Dispense Refill  . acetaminophen (TYLENOL) 325 MG tablet Take 650 mg by mouth every 6 (six) hours as needed.      . ALPRAZolam (XANAX) 0.25 MG tablet Take 0.25 mg by mouth 2 (two) times daily as needed for anxiety.      Marland Kitchen aspirin 81 MG tablet Take 81 mg by mouth daily.      . cholecalciferol (VITAMIN D) 1000 UNITS tablet Take 2,000 Units by mouth daily.      . cyclobenzaprine (FLEXERIL) 5 MG tablet Take 5 mg by mouth 3 (three) times daily as needed for muscle spasms.      Marland Kitchen FLUoxetine (PROZAC) 10 MG capsule Take 10 mg by mouth daily.      Marland Kitchen FLUoxetine (PROZAC) 10 MG tablet Take 10 mg by mouth daily.      Marland Kitchen oxyCODONE-acetaminophen (ROXICET) 5-325 MG per tablet Take 1-2 tablets by mouth every 4 (four) hours as needed for severe pain.  30 tablet  0  . Probiotic Product (PROBIOTIC DAILY PO) Take by mouth daily.      . ranitidine (ZANTAC) 150 MG tablet Take 150 mg by mouth as needed.       . rosuvastatin (CRESTOR) 20 MG tablet Take 20 mg by mouth daily.      . valsartan (DIOVAN) 320 MG tablet Take 320 mg by mouth daily.  No current facility-administered medications for this visit.    PHYSICAL EXAMINATION: ECOG PERFORMANCE STATUS: 0 - Asymptomatic  Filed Vitals:   03/12/14 1127  BP: 127/77  Pulse: 101  Temp: 98.2 F (36.8 C)  Resp: 18   Filed Weights   03/12/14 1127  Weight: 205 lb 9 oz (93.243 kg)    GENERAL:alert, no distress and comfortable SKIN: skin color, texture, turgor are normal, no rashes or significant lesions EYES: normal, Conjunctiva are pink and non-injected, sclera clear OROPHARYNX:no exudate, no erythema and  lips, buccal mucosa, and tongue normal  NECK: supple, thyroid normal size, non-tender, without nodularity LYMPH:  no palpable lymphadenopathy in the cervical, axillary or inguinal LUNGS: clear to auscultation and percussion with normal breathing effort HEART: regular rate & rhythm and no murmurs and no lower extremity edema ABDOMEN:abdomen soft, non-tender and normal bowel sounds Musculoskeletal:no cyanosis of digits and no clubbing  NEURO: alert & oriented x 3 with fluent speech, no focal motor/sensory deficits  LABORATORY DATA:  I have reviewed the data as listed   Chemistry      Component Value Date/Time   NA 140 02/25/2014 1030   K 3.9 02/25/2014 1030   CL 100 02/25/2014 1030   CO2 24 02/25/2014 1030   BUN 17 02/25/2014 1030   CREATININE 1.19* 02/25/2014 1030      Component Value Date/Time   CALCIUM 9.5 02/25/2014 1030   ALKPHOS 131* 09/22/2009 1010   AST 156* 09/22/2009 1010   ALT 110* 09/22/2009 1010   BILITOT 1.0 09/22/2009 1010       Lab Results  Component Value Date   WBC 11.9* 09/22/2009   HGB 15.2* 02/27/2014   HCT 46.0 09/22/2009   MCV 82.9 09/22/2009   PLT 277 09/22/2009   NEUTROABS 10.4* 09/22/2009     RADIOGRAPHIC STUDIES: I have personally reviewed the radiology reports and agreed with their findings. No results found.   ASSESSMENT & PLAN:  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 Patient had an echocardiogram which was normal. Chemotherapy class was also completed. She will start chemotherapy on November 16th. Patient was counseled extensively regarding chemotherapy and appears to have a good understanding. 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  Return to clinic in November 16 for start of treatment. Discuss the risks and benefits of chemotherapy including all the toxicities of her previously discussed as well.  No orders of the defined types were placed in this encounter.   The patient has a good understanding of the overall plan. she agrees with it. She will call with any problems that may develop before her next visit here.  I spent 15 minutes counseling the patient face to face. The total time spent in the appointment was 25 minutes and more than 50% was on counseling and review of test results    Gudena, Vinay K, MD 03/12/2014 12:07 PM   

## 2014-03-12 NOTE — Telephone Encounter (Signed)
, °

## 2014-03-24 ENCOUNTER — Ambulatory Visit: Payer: Self-pay | Admitting: Hematology and Oncology

## 2014-03-24 ENCOUNTER — Ambulatory Visit: Payer: Self-pay

## 2014-03-30 ENCOUNTER — Other Ambulatory Visit (HOSPITAL_BASED_OUTPATIENT_CLINIC_OR_DEPARTMENT_OTHER): Payer: Medicare Other

## 2014-03-30 ENCOUNTER — Ambulatory Visit (HOSPITAL_BASED_OUTPATIENT_CLINIC_OR_DEPARTMENT_OTHER): Payer: Medicare Other

## 2014-03-30 ENCOUNTER — Other Ambulatory Visit (HOSPITAL_BASED_OUTPATIENT_CLINIC_OR_DEPARTMENT_OTHER): Payer: Medicare Other | Admitting: Hematology and Oncology

## 2014-03-30 ENCOUNTER — Telehealth: Payer: Self-pay | Admitting: Hematology and Oncology

## 2014-03-30 ENCOUNTER — Ambulatory Visit (HOSPITAL_BASED_OUTPATIENT_CLINIC_OR_DEPARTMENT_OTHER): Payer: Medicare Other | Admitting: Hematology and Oncology

## 2014-03-30 ENCOUNTER — Other Ambulatory Visit: Payer: Self-pay

## 2014-03-30 VITALS — BP 127/52 | HR 78 | Temp 97.6°F | Resp 18 | Ht 63.0 in | Wt 205.1 lb

## 2014-03-30 DIAGNOSIS — C50411 Malignant neoplasm of upper-outer quadrant of right female breast: Secondary | ICD-10-CM

## 2014-03-30 DIAGNOSIS — Z5111 Encounter for antineoplastic chemotherapy: Secondary | ICD-10-CM | POA: Diagnosis not present

## 2014-03-30 DIAGNOSIS — Z170 Estrogen receptor positive status [ER+]: Secondary | ICD-10-CM

## 2014-03-30 LAB — CBC WITH DIFFERENTIAL/PLATELET
BASO%: 0.3 % (ref 0.0–2.0)
Basophils Absolute: 0 10*3/uL (ref 0.0–0.1)
EOS%: 2.1 % (ref 0.0–7.0)
Eosinophils Absolute: 0.1 10*3/uL (ref 0.0–0.5)
HEMATOCRIT: 41.2 % (ref 34.8–46.6)
HGB: 13.8 g/dL (ref 11.6–15.9)
LYMPH#: 1.5 10*3/uL (ref 0.9–3.3)
LYMPH%: 25.4 % (ref 14.0–49.7)
MCH: 27 pg (ref 25.1–34.0)
MCHC: 33.5 g/dL (ref 31.5–36.0)
MCV: 80.6 fL (ref 79.5–101.0)
MONO#: 0.5 10*3/uL (ref 0.1–0.9)
MONO%: 8.2 % (ref 0.0–14.0)
NEUT#: 3.7 10*3/uL (ref 1.5–6.5)
NEUT%: 64 % (ref 38.4–76.8)
PLATELETS: 228 10*3/uL (ref 145–400)
RBC: 5.11 10*6/uL (ref 3.70–5.45)
RDW: 13.9 % (ref 11.2–14.5)
WBC: 5.8 10*3/uL (ref 3.9–10.3)

## 2014-03-30 LAB — COMPREHENSIVE METABOLIC PANEL (CC13)
ALBUMIN: 3.6 g/dL (ref 3.5–5.0)
ALT: 22 U/L (ref 0–55)
ANION GAP: 8 meq/L (ref 3–11)
AST: 22 U/L (ref 5–34)
Alkaline Phosphatase: 87 U/L (ref 40–150)
BILIRUBIN TOTAL: 0.54 mg/dL (ref 0.20–1.20)
BUN: 23.8 mg/dL (ref 7.0–26.0)
CO2: 26 meq/L (ref 22–29)
Calcium: 9.5 mg/dL (ref 8.4–10.4)
Chloride: 105 mEq/L (ref 98–109)
Creatinine: 1.3 mg/dL — ABNORMAL HIGH (ref 0.6–1.1)
GLUCOSE: 116 mg/dL (ref 70–140)
POTASSIUM: 3.5 meq/L (ref 3.5–5.1)
SODIUM: 139 meq/L (ref 136–145)
Total Protein: 7.4 g/dL (ref 6.4–8.3)

## 2014-03-30 MED ORDER — LORAZEPAM 0.5 MG PO TABS: 0.5000 mg | ORAL_TABLET | Freq: Four times a day (QID) | ORAL | Status: DC | PRN

## 2014-03-30 MED ORDER — DEXAMETHASONE SODIUM PHOSPHATE 20 MG/5ML IJ SOLN
12.0000 mg | Freq: Once | INTRAMUSCULAR | Status: AC
  Administered 2014-03-30: 12 mg via INTRAVENOUS

## 2014-03-30 MED ORDER — HEPARIN SOD (PORK) LOCK FLUSH 100 UNIT/ML IV SOLN
500.0000 [IU] | Freq: Once | INTRAVENOUS | Status: AC | PRN
  Administered 2014-03-30: 500 [IU]
  Filled 2014-03-30: qty 5

## 2014-03-30 MED ORDER — PROCHLORPERAZINE MALEATE 10 MG PO TABS: 10.0000 mg | ORAL_TABLET | Freq: Four times a day (QID) | ORAL | Status: DC | PRN

## 2014-03-30 MED ORDER — SODIUM CHLORIDE 0.9 % IV SOLN
Freq: Once | INTRAVENOUS | Status: AC
  Administered 2014-03-30: 14:00:00 via INTRAVENOUS

## 2014-03-30 MED ORDER — SODIUM CHLORIDE 0.9 % IV SOLN
150.0000 mg | Freq: Once | INTRAVENOUS | Status: AC
  Administered 2014-03-30: 150 mg via INTRAVENOUS
  Filled 2014-03-30: qty 5

## 2014-03-30 MED ORDER — DOXORUBICIN HCL CHEMO IV INJECTION 2 MG/ML
60.0000 mg/m2 | Freq: Once | INTRAVENOUS | Status: AC
  Administered 2014-03-30: 122 mg via INTRAVENOUS
  Filled 2014-03-30: qty 61

## 2014-03-30 MED ORDER — UNABLE TO FIND: 1.0000 | Status: DC | PRN

## 2014-03-30 MED ORDER — PALONOSETRON HCL INJECTION 0.25 MG/5ML
INTRAVENOUS | Status: AC
  Filled 2014-03-30: qty 5

## 2014-03-30 MED ORDER — PALONOSETRON HCL INJECTION 0.25 MG/5ML
0.2500 mg | Freq: Once | INTRAVENOUS | Status: AC
  Administered 2014-03-30: 0.25 mg via INTRAVENOUS

## 2014-03-30 MED ORDER — DEXAMETHASONE SODIUM PHOSPHATE 20 MG/5ML IJ SOLN
INTRAMUSCULAR | Status: AC
  Filled 2014-03-30: qty 5

## 2014-03-30 MED ORDER — LIDOCAINE-PRILOCAINE 2.5-2.5 % EX CREA: 1.0000 "application " | TOPICAL_CREAM | CUTANEOUS | Status: DC | PRN

## 2014-03-30 MED ORDER — DEXAMETHASONE 4 MG PO TABS: ORAL_TABLET | ORAL | Status: DC

## 2014-03-30 MED ORDER — ONDANSETRON HCL 8 MG PO TABS: 8.0000 mg | ORAL_TABLET | Freq: Two times a day (BID) | ORAL | Status: DC | PRN

## 2014-03-30 MED ORDER — SODIUM CHLORIDE 0.9 % IJ SOLN
10.0000 mL | INTRAMUSCULAR | Status: DC | PRN
  Administered 2014-03-30: 10 mL
  Filled 2014-03-30: qty 10

## 2014-03-30 MED ORDER — SODIUM CHLORIDE 0.9 % IV SOLN
600.0000 mg/m2 | Freq: Once | INTRAVENOUS | Status: AC
  Administered 2014-03-30: 1220 mg via INTRAVENOUS
  Filled 2014-03-30: qty 61

## 2014-03-30 NOTE — Patient Instructions (Signed)
Cancer Center Discharge Instructions for Patients Receiving Chemotherapy  Today you received the following chemotherapy agents Adria and Cytoxan  To help prevent nausea and vomiting after your treatment, we encourage you to take your nausea medication as prescribed   If you develop nausea and vomiting that is not controlled by your nausea medication, call the clinic.   BELOW ARE SYMPTOMS THAT SHOULD BE REPORTED IMMEDIATELY:  *FEVER GREATER THAN 100.5 F  *CHILLS WITH OR WITHOUT FEVER  NAUSEA AND VOMITING THAT IS NOT CONTROLLED WITH YOUR NAUSEA MEDICATION  *UNUSUAL SHORTNESS OF BREATH  *UNUSUAL BRUISING OR BLEEDING  TENDERNESS IN MOUTH AND THROAT WITH OR WITHOUT PRESENCE OF ULCERS  *URINARY PROBLEMS  *BOWEL PROBLEMS  UNUSUAL RASH Items with * indicate a potential emergency and should be followed up as soon as possible.  Feel free to call the clinic you have any questions or concerns. The clinic phone number is [phone].    

## 2014-03-30 NOTE — Progress Notes (Signed)
Patient Care Team: Hulan Fess, MD as PCP - General (Family Medicine)  DIAGNOSIS: Breast cancer of upper-outer quadrant of right female breast   Staging form: Breast, AJCC 7th Edition     Clinical: Stage IA (T1c, N0, cM0) - Signed by Thea Silversmith, MD on 02/18/2014     Pathologic: Stage IIA (T2, N0, cM0) - Signed by Rulon Eisenmenger, MD on 03/12/2014   SUMMARY OF ONCOLOGIC HISTORY:   Breast cancer of upper-outer quadrant of right female breast   02/27/2014 Surgery Right breast lumpectomy: Invasive ductal carcinoma grade 3, 2.5 cm, DCIS intermediate grade, margins negative, 2 SLN negative, ER/PR HER-2 negative   03/30/2014 -  Chemotherapy Adjuvant chemotherapy with dose dense Adriamycin Cytoxan x4 followed by Abraxane weekly x12    CHIEF COMPLIANT: Cycle 1 day 1 of dose dense Adriamycin and Cytoxan  INTERVAL HISTORY: Cheyenne Riggs is a 68 year old Caucasian with above-mentioned history of right-sided breast cancer treated with lumpectomy. She triple negative disease and is here today to start the cycle of adjuvant chemotherapy. Her port was placed without any problems. Echocardiogram of the heart showed an ejection fraction of 60-65%. She went through chemotherapy education. She is here today ready to start chemotherapy. She is accompanied by her son.  REVIEW OF SYSTEMS:   Constitutional: Denies fevers, chills or abnormal weight loss Eyes: Denies blurriness of vision Ears, nose, mouth, throat, and face: Denies mucositis or sore throat Respiratory: Denies cough, dyspnea or wheezes Cardiovascular: Denies palpitation, chest discomfort or lower extremity swelling Gastrointestinal:  Denies nausea, heartburn or change in bowel habits Skin: Denies abnormal skin rashes Lymphatics: Denies new lymphadenopathy or easy bruising Neurological:Denies numbness, tingling or new weaknesses Behavioral/Psych: Mood is stable, no new changes  Breast: recovered well from surgery. All other systems were  reviewed with the patient and are negative.  I have reviewed the past medical history, past surgical history, social history and family history with the patient and they are unchanged from previous note.  ALLERGIES:  is allergic to ciprofloxacin; demerol; lidocaine; other; septra; codeine; and novocain.  MEDICATIONS:  Current Outpatient Prescriptions  Medication Sig Dispense Refill  . acetaminophen (TYLENOL) 325 MG tablet Take 650 mg by mouth every 6 (six) hours as needed.    . ALPRAZolam (XANAX) 0.25 MG tablet Take 0.25 mg by mouth 2 (two) times daily as needed for anxiety.    Marland Kitchen aspirin 81 MG tablet Take 81 mg by mouth daily.    . cholecalciferol (VITAMIN D) 1000 UNITS tablet Take 2,000 Units by mouth daily.    . cyclobenzaprine (FLEXERIL) 5 MG tablet Take 5 mg by mouth 3 (three) times daily as needed for muscle spasms.    Marland Kitchen dexamethasone (DECADRON) 4 MG tablet Take 2 tablets by mouth once a day on the day after chemotherapy and then take 2 tablets two times a day for 2 days. Take with food. 30 tablet 1  . FLUoxetine (PROZAC) 10 MG capsule Take 10 mg by mouth daily.    Marland Kitchen lidocaine-prilocaine (EMLA) cream Apply 1 application topically as needed. 30 g 6  . LORazepam (ATIVAN) 0.5 MG tablet Take 1 tablet (0.5 mg total) by mouth every 6 (six) hours as needed for anxiety (nausea and vomiting). 30 tablet 0  . ondansetron (ZOFRAN) 8 MG tablet Take 1 tablet (8 mg total) by mouth 2 (two) times daily as needed. Start on the third day after chemotherapy. 30 tablet 1  . Probiotic Product (PROBIOTIC DAILY PO) Take by mouth daily.    Marland Kitchen  prochlorperazine (COMPAZINE) 10 MG tablet Take 1 tablet (10 mg total) by mouth every 6 (six) hours as needed (Nausea or vomiting). 30 tablet 1  . ranitidine (ZANTAC) 150 MG tablet Take 150 mg by mouth as needed.     . rosuvastatin (CRESTOR) 20 MG tablet Take 20 mg by mouth daily.    . valsartan (DIOVAN) 320 MG tablet Take 320 mg by mouth daily.     No current  facility-administered medications for this visit.    PHYSICAL EXAMINATION: ECOG PERFORMANCE STATUS: 0 - Asymptomatic  Filed Vitals:   03/30/14 1148  BP: 127/52  Pulse: 78  Temp: 97.6 F (36.4 C)  Resp: 18   Filed Weights   03/30/14 1148  Weight: 205 lb 1.6 oz (93.033 kg)    GENERAL:alert, no distress and comfortable SKIN: skin color, texture, turgor are normal, no rashes or significant lesions EYES: normal, Conjunctiva are pink and non-injected, sclera clear OROPHARYNX:no exudate, no erythema and lips, buccal mucosa, and tongue normal  NECK: supple, thyroid normal size, non-tender, without nodularity LYMPH:  no palpable lymphadenopathy in the cervical, axillary or inguinal LUNGS: clear to auscultation and percussion with normal breathing effort HEART: regular rate & rhythm and no murmurs and no lower extremity edema ABDOMEN:abdomen soft, non-tender and normal bowel sounds Musculoskeletal:no cyanosis of digits and no clubbing  NEURO: alert & oriented x 3 with fluent speech, no focal motor/sensory deficits  LABORATORY DATA:  I have reviewed the data as listed   Chemistry      Component Value Date/Time   NA 139 03/30/2014 1056   NA 140 02/25/2014 1030   K 3.5 03/30/2014 1056   K 3.9 02/25/2014 1030   CL 100 02/25/2014 1030   CO2 26 03/30/2014 1056   CO2 24 02/25/2014 1030   BUN 23.8 03/30/2014 1056   BUN 17 02/25/2014 1030   CREATININE 1.3* 03/30/2014 1056   CREATININE 1.19* 02/25/2014 1030      Component Value Date/Time   CALCIUM 9.5 03/30/2014 1056   CALCIUM 9.5 02/25/2014 1030   ALKPHOS 87 03/30/2014 1056   ALKPHOS 131* 09/22/2009 1010   AST 22 03/30/2014 1056   AST 156* 09/22/2009 1010   ALT 22 03/30/2014 1056   ALT 110* 09/22/2009 1010   BILITOT 0.54 03/30/2014 1056   BILITOT 1.0 09/22/2009 1010       Lab Results  Component Value Date   WBC 5.8 03/30/2014   HGB 13.8 03/30/2014   HCT 41.2 03/30/2014   MCV 80.6 03/30/2014   PLT 228 03/30/2014    NEUTROABS 3.7 03/30/2014   ASSESSMENT & PLAN:  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  Consent for chemotherapy: I have reviewed chemotherapy side effects and patient has gone through chemotherapy education class. Echocardiogram done on 03/04/2014 revealed an EF of 60-65%. Patient is consented and ready for chemotherapy today.  Patient will receive dose dense a.c.x4 followed by Abraxane weekly x12  Antiemetics counseling: I discussed the details of antiemetics regimen and how she should take these medications. Return to clinic in one week for toxicity check a blood count check.   Orders Placed This Encounter  Procedures  . CBC with Differential    Standing Status: Future     Number of Occurrences:      Standing Expiration Date: 03/30/2015  . Comprehensive metabolic panel (Cmet) -  [facility]    Standing Status: Future     Number of Occurrences:      Standing Expiration Date: 03/30/2015  . PHYSICIAN COMMUNICATION ORDER    A baseline Echo/ Muga should be obtained prior to initiation of Anthracycline Chemotherapy   The patient has a good understanding of the overall plan. she agrees with it. She will call with any problems that may develop before her next visit here.   Rulon Eisenmenger, MD 03/30/2014 12:17 PM

## 2014-03-30 NOTE — Addendum Note (Signed)
Addended by: Prentiss Bells on: 03/30/2014 01:39 PM   Modules accepted: Orders

## 2014-03-30 NOTE — Telephone Encounter (Signed)
, °

## 2014-03-30 NOTE — Assessment & Plan Note (Signed)
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  Consent for chemotherapy: I have reviewed chemotherapy side effects and patient has gone through chemotherapy education class. Echocardiogram done on 03/04/2014 revealed an EF of 60-65%. Patient is consented and ready for chemotherapy today.  Patient will receive dose dense a.c.x4 followed by Abraxane weekly x12  Antiemetics counseling: I discussed the details of antiemetics regimen and how she should take these medications. Return to clinic in one week for toxicity check a blood count check.

## 2014-03-31 ENCOUNTER — Encounter: Payer: Self-pay | Admitting: *Deleted

## 2014-03-31 ENCOUNTER — Ambulatory Visit (HOSPITAL_BASED_OUTPATIENT_CLINIC_OR_DEPARTMENT_OTHER): Payer: Medicare Other

## 2014-03-31 ENCOUNTER — Telehealth: Payer: Self-pay | Admitting: *Deleted

## 2014-03-31 DIAGNOSIS — C50411 Malignant neoplasm of upper-outer quadrant of right female breast: Secondary | ICD-10-CM

## 2014-03-31 DIAGNOSIS — Z5189 Encounter for other specified aftercare: Secondary | ICD-10-CM

## 2014-03-31 MED ORDER — PEGFILGRASTIM INJECTION 6 MG/0.6ML ~~LOC~~
6.0000 mg | PREFILLED_SYRINGE | Freq: Once | SUBCUTANEOUS | Status: AC
  Administered 2014-03-31: 6 mg via SUBCUTANEOUS
  Filled 2014-03-31: qty 0.6

## 2014-03-31 NOTE — Telephone Encounter (Signed)
Chemo follow up done by Jan, injection nurse today. 

## 2014-03-31 NOTE — Patient Instructions (Signed)
Pegfilgrastim injection What is this medicine? PEGFILGRASTIM (peg fil GRA stim) is a long-acting granulocyte colony-stimulating factor that stimulates the growth of neutrophils, a type of white blood cell important in the body's fight against infection. It is used to reduce the incidence of fever and infection in patients with certain types of cancer who are receiving chemotherapy that affects the bone marrow. This medicine may be used for other purposes; ask your health care provider or pharmacist if you have questions. COMMON BRAND NAME(S): Neulasta What should I tell my health care provider before I take this medicine? They need to know if you have any of these conditions: -latex allergy -ongoing radiation therapy -sickle cell disease -skin reactions to acrylic adhesives (On-Body Injector only) -an unusual or allergic reaction to pegfilgrastim, filgrastim, other medicines, foods, dyes, or preservatives -pregnant or trying to get pregnant -breast-feeding How should I use this medicine? This medicine is for injection under the skin. If you get this medicine at home, you will be taught how to prepare and give the pre-filled syringe or how to use the On-body Injector. Refer to the patient Instructions for Use for detailed instructions. Use exactly as directed. Take your medicine at regular intervals. Do not take your medicine more often than directed. It is important that you put your used needles and syringes in a special sharps container. Do not put them in a trash can. If you do not have a sharps container, call your pharmacist or healthcare provider to get one. Talk to your pediatrician regarding the use of this medicine in children. Special care may be needed. Overdosage: If you think you have taken too much of this medicine contact a poison control center or emergency room at once. NOTE: This medicine is only for you. Do not share this medicine with others. What if I miss a dose? It is  important not to miss your dose. Call your doctor or health care professional if you miss your dose. If you miss a dose due to an On-body Injector failure or leakage, a new dose should be administered as soon as possible using a single prefilled syringe for manual use. What may interact with this medicine? Interactions have not been studied. Give your health care provider a list of all the medicines, herbs, non-prescription drugs, or dietary supplements you use. Also tell them if you smoke, drink alcohol, or use illegal drugs. Some items may interact with your medicine. This list may not describe all possible interactions. Give your health care provider a list of all the medicines, herbs, non-prescription drugs, or dietary supplements you use. Also tell them if you smoke, drink alcohol, or use illegal drugs. Some items may interact with your medicine. What should I watch for while using this medicine? You may need blood work done while you are taking this medicine. If you are going to need a MRI, CT scan, or other procedure, tell your doctor that you are using this medicine (On-Body Injector only). What side effects may I notice from receiving this medicine? Side effects that you should report to your doctor or health care professional as soon as possible: -allergic reactions like skin rash, itching or hives, swelling of the face, lips, or tongue -dizziness -fever -pain, redness, or irritation at site where injected -pinpoint red spots on the skin -shortness of breath or breathing problems -stomach or side pain, or pain at the shoulder -swelling -tiredness -trouble passing urine Side effects that usually do not require medical attention (report to your doctor   or health care professional if they continue or are bothersome): -bone pain -muscle pain This list may not describe all possible side effects. Call your doctor for medical advice about side effects. You may report side effects to FDA at  1-800-FDA-1088. Where should I keep my medicine? Keep out of the reach of children. Store pre-filled syringes in a refrigerator between 2 and 8 degrees C (36 and 46 degrees F). Do not freeze. Keep in carton to protect from light. Throw away this medicine if it is left out of the refrigerator for more than 48 hours. Throw away any unused medicine after the expiration date. NOTE: This sheet is a summary. It may not cover all possible information. If you have questions about this medicine, talk to your doctor, pharmacist, or health care provider.  2015, [employer]vier/Gold Standard. (2013-07-31 16:14:05)  

## 2014-03-31 NOTE — Progress Notes (Signed)
Patient here for neulasta after 1st cytoxan and adriamycin.  She is doing well and denies any nausea, vomiting, diarrhea or constipation. She does have a question about her incision.  There is a little red place on it, but it appears to be healing.  It is not bright red, no swelling or pain.  She knows to keep an eye on it and will call us if it changes.  She also has questions about her anti-nausea medication.  Called Jonelle Sports RN with Dr. Lindi Adie and she will talk with Dr. Lindi Adie and then call the patient.  (Patient had emend and aloxi yesterday and had zofran and compazine at home for nausea). Called patient at [phone] to let her know that Dr. Geralyn Flash nurse, Karna Christmas, will be calling her to clarify.  She appreciated my calling her and letting her know that.

## 2014-04-01 ENCOUNTER — Encounter: Payer: Self-pay | Admitting: Hematology and Oncology

## 2014-04-01 ENCOUNTER — Telehealth: Payer: Self-pay | Admitting: *Deleted

## 2014-04-01 NOTE — Telephone Encounter (Signed)
Reviewed email message from [place] from pt wanting clarification on how to take antiemetics.  Spoke with pt and went over directions on how to take for each antiemetics.  Pt voiced understanding and appreciated the call.

## 2014-04-06 ENCOUNTER — Telehealth: Payer: Self-pay | Admitting: *Deleted

## 2014-04-06 ENCOUNTER — Ambulatory Visit (HOSPITAL_BASED_OUTPATIENT_CLINIC_OR_DEPARTMENT_OTHER): Payer: Medicare Other | Admitting: Hematology and Oncology

## 2014-04-06 ENCOUNTER — Other Ambulatory Visit (HOSPITAL_BASED_OUTPATIENT_CLINIC_OR_DEPARTMENT_OTHER): Payer: Medicare Other

## 2014-04-06 ENCOUNTER — Encounter: Payer: Self-pay | Admitting: General Practice

## 2014-04-06 ENCOUNTER — Telehealth: Payer: Self-pay | Admitting: Hematology and Oncology

## 2014-04-06 VITALS — BP 111/53 | HR 92 | Temp 98.0°F | Resp 18 | Ht 63.0 in | Wt 203.9 lb

## 2014-04-06 DIAGNOSIS — R070 Pain in throat: Secondary | ICD-10-CM | POA: Diagnosis not present

## 2014-04-06 DIAGNOSIS — R197 Diarrhea, unspecified: Secondary | ICD-10-CM

## 2014-04-06 DIAGNOSIS — G629 Polyneuropathy, unspecified: Secondary | ICD-10-CM

## 2014-04-06 DIAGNOSIS — D702 Other drug-induced agranulocytosis: Secondary | ICD-10-CM

## 2014-04-06 DIAGNOSIS — C50411 Malignant neoplasm of upper-outer quadrant of right female breast: Secondary | ICD-10-CM

## 2014-04-06 LAB — CBC WITH DIFFERENTIAL/PLATELET
BASO%: 2.6 % — ABNORMAL HIGH (ref 0.0–2.0)
BASOS ABS: 0 10*3/uL (ref 0.0–0.1)
EOS%: 23.1 % — ABNORMAL HIGH (ref 0.0–7.0)
Eosinophils Absolute: 0.1 10*3/uL (ref 0.0–0.5)
HCT: 37.9 % (ref 34.8–46.6)
HEMOGLOBIN: 12.6 g/dL (ref 11.6–15.9)
LYMPH%: 69.2 % — ABNORMAL HIGH (ref 14.0–49.7)
MCH: 26.8 pg (ref 25.1–34.0)
MCHC: 33.2 g/dL (ref 31.5–36.0)
MCV: 80.5 fL (ref 79.5–101.0)
MONO#: 0 10*3/uL — ABNORMAL LOW (ref 0.1–0.9)
MONO%: 2.6 % (ref 0.0–14.0)
NEUT#: 0 10*3/uL — CL (ref 1.5–6.5)
NEUT%: 2.5 % — AB (ref 38.4–76.8)
PLATELETS: 66 10*3/uL — AB (ref 145–400)
RBC: 4.71 10*6/uL (ref 3.70–5.45)
RDW: 13.5 % (ref 11.2–14.5)
WBC: 0.4 10*3/uL — CL (ref 3.9–10.3)
lymph#: 0.3 10*3/uL — ABNORMAL LOW (ref 0.9–3.3)

## 2014-04-06 LAB — COMPREHENSIVE METABOLIC PANEL (CC13)
ALK PHOS: 94 U/L (ref 40–150)
ALT: 17 U/L (ref 0–55)
AST: 12 U/L (ref 5–34)
Albumin: 3.3 g/dL — ABNORMAL LOW (ref 3.5–5.0)
Anion Gap: 10 mEq/L (ref 3–11)
BUN: 25.2 mg/dL (ref 7.0–26.0)
CO2: 25 mEq/L (ref 22–29)
Calcium: 9.4 mg/dL (ref 8.4–10.4)
Chloride: 101 mEq/L (ref 98–109)
Creatinine: 1.2 mg/dL — ABNORMAL HIGH (ref 0.6–1.1)
GLUCOSE: 161 mg/dL — AB (ref 70–140)
Potassium: 3.9 mEq/L (ref 3.5–5.1)
Sodium: 137 mEq/L (ref 136–145)
Total Bilirubin: 0.85 mg/dL (ref 0.20–1.20)
Total Protein: 6.6 g/dL (ref 6.4–8.3)

## 2014-04-06 NOTE — Assessment & Plan Note (Addendum)
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 Patient started systemic adjuvant chemotherapy with dose dense Adriamycin Cytoxan. She started chemotherapy in 03/30/2014. Today is cycle 1 day 8. Patient experience of following toxicities of chemotherapy. 1. Diarrhea for one day 2. Left leg neuropathic pain X 1 day 3. Low blood pressures in spite of drinking 4 bottles of water 4. Mild sore throat 5. Severe neutropenia related to chemotherapy ANC 0 gave the patient strict instructions regarding neutropenic precautions and to what any sick contacts the proximal groups. We will be reduced the dosage of next chemotherapy.  Monitoring closely for toxicities Return to clinic in one week for cycle 2. 

## 2014-04-06 NOTE — Telephone Encounter (Signed)
, °

## 2014-04-06 NOTE — Telephone Encounter (Signed)
Per staff message and POF I have scheduled/adjusted appts. Advised scheduler of appts. JMW  

## 2014-04-06 NOTE — Progress Notes (Signed)
Followed up with Cheyenne Riggs at the healing arts table, providing space for her to share and process a health update, how she is coping with tx while living alone, and other aspects of meaning-making in her life.  She was in good spirits, but was feeling unwell physically (tired and aware that her white count was very low).  Provided pastoral presence, empathic listening, and theological reflection.  Pt verbalized appreciation for opportunity to connect and share.  [place], [place]

## 2014-04-06 NOTE — Progress Notes (Signed)
Patient Care Team: Hulan Fess, MD as PCP - General (Family Medicine)  DIAGNOSIS: Breast cancer of upper-outer quadrant of right female breast   Staging form: Breast, AJCC 7th Edition     Clinical: Stage IA (T1c, N0, cM0) - Signed by Thea Silversmith, MD on 02/18/2014     Pathologic: Stage IIA (T2, N0, cM0) - Signed by Rulon Eisenmenger, MD on 03/12/2014   SUMMARY OF ONCOLOGIC HISTORY:   Breast cancer of upper-outer quadrant of right female breast   02/27/2014 Surgery Right breast lumpectomy: Invasive ductal carcinoma grade 3, 2.5 cm, DCIS intermediate grade, margins negative, 2 SLN negative, ER/PR HER-2 negative   03/30/2014 -  Chemotherapy Adjuvant chemotherapy with dose dense Adriamycin Cytoxan x4 followed by Abraxane weekly x12    CHIEF COMPLIANT: cycle 1 day 8 of Adriamycin and Cytoxan  INTERVAL HISTORY: Cheyenne Riggs is a 68 year old Caucasian with above-mentioned history of right-sided breast cancer treated with adjuvant chemotherapy with a dose dense Adriamycin Cytoxan. She is here for cycle 1 day 8 toxicity check. She reports that for the first 1-2 days after chemotherapy she felt great with lot of energy and appetite. After that her energy levels have declined. This morning she had 2 episodes of diarrhea. She also had some numbness in the left leg. She also felt mildly dizzy and she had to sit down the floor today. She was found to have low blood pressure in the clinic.  REVIEW OF SYSTEMS:   Constitutional: Denies fevers, chills or abnormal weight loss Eyes: Denies blurriness of vision Ears, nose, mouth, throat, and face: Denies mucositis or sore throat Respiratory: Denies cough, dyspnea or wheezes Cardiovascular: Denies palpitation, chest discomfort or lower extremity swelling Gastrointestinal:  Denies nausea, heartburn or change in bowel habits Skin: Denies abnormal skin rashes Lymphatics: Denies new lymphadenopathy or easy bruising Neurological:Denies numbness, tingling or new  weaknesses Behavioral/Psych: Mood is stable, no new changes  All other systems were reviewed with the patient and are negative.  I have reviewed the past medical history, past surgical history, social history and family history with the patient and they are unchanged from previous note.  ALLERGIES:  is allergic to ciprofloxacin; demerol; lidocaine; other; septra; codeine; and novocain.  MEDICATIONS:  Current Outpatient Prescriptions  Medication Sig Dispense Refill  . acetaminophen (TYLENOL) 325 MG tablet Take 650 mg by mouth every 6 (six) hours as needed.    Marland Kitchen aspirin 81 MG tablet Take 81 mg by mouth daily.    . cholecalciferol (VITAMIN D) 1000 UNITS tablet Take 2,000 Units by mouth daily.    Marland Kitchen dexamethasone (DECADRON) 4 MG tablet Take 2 tablets by mouth once a day on the day after chemotherapy and then take 2 tablets two times a day for 2 days. Take with food. 30 tablet 1  . FLUoxetine (PROZAC) 10 MG capsule Take 10 mg by mouth daily.    Marland Kitchen lidocaine-prilocaine (EMLA) cream Apply 1 application topically as needed. 30 g 6  . LORazepam (ATIVAN) 0.5 MG tablet Take 1 tablet (0.5 mg total) by mouth every 6 (six) hours as needed for anxiety (nausea and vomiting). 30 tablet 0  . ondansetron (ZOFRAN) 8 MG tablet Take 1 tablet (8 mg total) by mouth 2 (two) times daily as needed. Start on the third day after chemotherapy. 30 tablet 1  . oxyCODONE-acetaminophen (PERCOCET/ROXICET) 5-325 MG per tablet   0  . Probiotic Product (PROBIOTIC DAILY PO) Take by mouth daily.    . prochlorperazine (COMPAZINE) 10 MG tablet Take  1 tablet (10 mg total) by mouth every 6 (six) hours as needed (Nausea or vomiting). 30 tablet 1  . ranitidine (ZANTAC) 150 MG tablet Take 150 mg by mouth as needed.     . rosuvastatin (CRESTOR) 20 MG tablet Take 20 mg by mouth daily.    Marland Kitchen UNABLE TO FIND Apply 1 each topically as needed. 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  ICD-10  C50.411 1 each 0  . valsartan (DIOVAN) 320 MG tablet Take 320 mg by mouth daily.    Marland Kitchen ALPRAZolam (XANAX) 0.25 MG tablet Take 0.25 mg by mouth 2 (two) times daily as needed for anxiety.    . cyclobenzaprine (FLEXERIL) 5 MG tablet Take 5 mg by mouth 3 (three) times daily as needed for muscle spasms.     No current facility-administered medications for this visit.    PHYSICAL EXAMINATION: ECOG PERFORMANCE STATUS: 1 - Symptomatic but completely ambulatory  Filed Vitals:   04/06/14 1004  BP: 111/53  Pulse: 92  Temp: 98 F (36.7 C)  Resp: 18   Filed Weights   04/06/14 1004  Weight: 203 lb 14.4 oz (92.488 kg)    GENERAL:alert, no distress and comfortable SKIN: skin color, texture, turgor are normal, no rashes or significant lesions EYES: normal, Conjunctiva are pink and non-injected, sclera clear OROPHARYNX:no exudate, no erythema and lips, buccal mucosa, and tongue normal  NECK: supple, thyroid normal size, non-tender, without nodularity LYMPH:  no palpable lymphadenopathy in the cervical, axillary or inguinal LUNGS: clear to auscultation and percussion with normal breathing effort HEART: regular rate & rhythm and no murmurs and no lower extremity edema ABDOMEN:abdomen soft, non-tender and normal bowel sounds Musculoskeletal:no cyanosis of digits and no clubbing  NEURO: alert & oriented x 3 with fluent speech, no focal motor/sensory deficits LABORATORY DATA:  I have reviewed the data as listed   Chemistry      Component Value Date/Time   NA 137 04/06/2014 0945   NA 140 02/25/2014 1030   K 3.9 04/06/2014 0945   K 3.9 02/25/2014 1030   CL 100 02/25/2014 1030   CO2 25 04/06/2014 0945   CO2 24 02/25/2014 1030   BUN 25.2 04/06/2014 0945   BUN 17 02/25/2014 1030   CREATININE 1.2* 04/06/2014 0945   CREATININE 1.19* 02/25/2014 1030      Component Value Date/Time   CALCIUM 9.4 04/06/2014 0945   CALCIUM 9.5 02/25/2014 1030   ALKPHOS 94 04/06/2014 0945   ALKPHOS 131* 09/22/2009  1010   AST 12 04/06/2014 0945   AST 156* 09/22/2009 1010   ALT 17 04/06/2014 0945   ALT 110* 09/22/2009 1010   BILITOT 0.85 04/06/2014 0945   BILITOT 1.0 09/22/2009 1010       Lab Results  Component Value Date   WBC 0.4* 04/06/2014   HGB 12.6 04/06/2014   HCT 37.9 04/06/2014   MCV 80.5 04/06/2014   PLT 66* 04/06/2014   NEUTROABS 0.0* 04/06/2014    ASSESSMENT & PLAN:  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 Patient started systemic adjuvant chemotherapy with dose dense Adriamycin Cytoxan. She started chemotherapy in 03/30/2014. Today is cycle 1 day 8. Patient experience of following toxicities of chemotherapy. 1. Diarrhea for one day 2. Left leg neuropathic pain X 1 day 3. Low blood pressures in spite  of drinking 4 bottles of water 4. Mild sore throat 5. Severe neutropenia related to chemotherapy ANC 0 gave the patient strict instructions regarding neutropenic precautions and to what any sick contacts the proximal groups. We will be reduced the dosage of next chemotherapy.  Monitoring closely for toxicities Return to clinic in one week for cycle 2.    No orders of the defined types were placed in this encounter.   The patient has a good understanding of the overall plan. she agrees with it. She will call with any problems that may develop before her next visit here.   Rulon Eisenmenger, MD 04/06/2014 10:33 AM

## 2014-04-10 ENCOUNTER — Telehealth: Payer: Self-pay

## 2014-04-10 ENCOUNTER — Other Ambulatory Visit: Payer: Self-pay

## 2014-04-10 DIAGNOSIS — C50411 Malignant neoplasm of upper-outer quadrant of right female breast: Secondary | ICD-10-CM

## 2014-04-10 NOTE — Telephone Encounter (Signed)
Rcvd traige call report from [facility] A Nurse dtd 04/08/14 9:05 pm.  Sent to scan.

## 2014-04-13 ENCOUNTER — Ambulatory Visit (HOSPITAL_BASED_OUTPATIENT_CLINIC_OR_DEPARTMENT_OTHER): Payer: Medicare Other | Admitting: Hematology and Oncology

## 2014-04-13 ENCOUNTER — Ambulatory Visit (HOSPITAL_BASED_OUTPATIENT_CLINIC_OR_DEPARTMENT_OTHER): Payer: Medicare Other

## 2014-04-13 ENCOUNTER — Other Ambulatory Visit: Payer: Medicare Other

## 2014-04-13 ENCOUNTER — Other Ambulatory Visit (HOSPITAL_BASED_OUTPATIENT_CLINIC_OR_DEPARTMENT_OTHER): Payer: Medicare Other

## 2014-04-13 DIAGNOSIS — D701 Agranulocytosis secondary to cancer chemotherapy: Secondary | ICD-10-CM | POA: Diagnosis not present

## 2014-04-13 DIAGNOSIS — Z452 Encounter for adjustment and management of vascular access device: Secondary | ICD-10-CM | POA: Diagnosis not present

## 2014-04-13 DIAGNOSIS — Z5111 Encounter for antineoplastic chemotherapy: Secondary | ICD-10-CM | POA: Diagnosis not present

## 2014-04-13 DIAGNOSIS — C50411 Malignant neoplasm of upper-outer quadrant of right female breast: Secondary | ICD-10-CM

## 2014-04-13 DIAGNOSIS — I10 Essential (primary) hypertension: Secondary | ICD-10-CM

## 2014-04-13 LAB — CBC WITH DIFFERENTIAL/PLATELET
BASO%: 0.6 % (ref 0.0–2.0)
Basophils Absolute: 0.1 10*3/uL (ref 0.0–0.1)
EOS%: 0.1 % (ref 0.0–7.0)
Eosinophils Absolute: 0 10*3/uL (ref 0.0–0.5)
HCT: 35.4 % (ref 34.8–46.6)
HGB: 11.3 g/dL — ABNORMAL LOW (ref 11.6–15.9)
LYMPH%: 10.1 % — ABNORMAL LOW (ref 14.0–49.7)
MCH: 26 pg (ref 25.1–34.0)
MCHC: 31.8 g/dL (ref 31.5–36.0)
MCV: 81.7 fL (ref 79.5–101.0)
MONO#: 0.6 10*3/uL (ref 0.1–0.9)
MONO%: 4 % (ref 0.0–14.0)
NEUT#: 12 10*3/uL — ABNORMAL HIGH (ref 1.5–6.5)
NEUT%: 85.2 % — ABNORMAL HIGH (ref 38.4–76.8)
Platelets: 248 10*3/uL (ref 145–400)
RBC: 4.33 10*6/uL (ref 3.70–5.45)
RDW: 14.1 % (ref 11.2–14.5)
WBC: 14.1 10*3/uL — ABNORMAL HIGH (ref 3.9–10.3)
lymph#: 1.4 10*3/uL (ref 0.9–3.3)

## 2014-04-13 LAB — COMPREHENSIVE METABOLIC PANEL (CC13)
ALK PHOS: 92 U/L (ref 40–150)
ALT: 19 U/L (ref 0–55)
AST: 18 U/L (ref 5–34)
Albumin: 3.1 g/dL — ABNORMAL LOW (ref 3.5–5.0)
Anion Gap: 12 mEq/L — ABNORMAL HIGH (ref 3–11)
BUN: 13.4 mg/dL (ref 7.0–26.0)
CO2: 23 mEq/L (ref 22–29)
Calcium: 9 mg/dL (ref 8.4–10.4)
Chloride: 107 mEq/L (ref 98–109)
Creatinine: 1.3 mg/dL — ABNORMAL HIGH (ref 0.6–1.1)
Glucose: 151 mg/dl — ABNORMAL HIGH (ref 70–140)
Potassium: 3.7 mEq/L (ref 3.5–5.1)
SODIUM: 142 meq/L (ref 136–145)
TOTAL PROTEIN: 6.4 g/dL (ref 6.4–8.3)
Total Bilirubin: 0.21 mg/dL (ref 0.20–1.20)

## 2014-04-13 MED ORDER — PALONOSETRON HCL INJECTION 0.25 MG/5ML
INTRAVENOUS | Status: AC
  Filled 2014-04-13: qty 5

## 2014-04-13 MED ORDER — SODIUM CHLORIDE 0.9 % IV SOLN
Freq: Once | INTRAVENOUS | Status: AC
  Administered 2014-04-13: 17:00:00 via INTRAVENOUS

## 2014-04-13 MED ORDER — DEXAMETHASONE SODIUM PHOSPHATE 20 MG/5ML IJ SOLN
12.0000 mg | Freq: Once | INTRAMUSCULAR | Status: AC
  Administered 2014-04-13: 12 mg via INTRAVENOUS

## 2014-04-13 MED ORDER — SODIUM CHLORIDE 0.9 % IV SOLN
150.0000 mg | Freq: Once | INTRAVENOUS | Status: AC
  Administered 2014-04-13: 150 mg via INTRAVENOUS
  Filled 2014-04-13: qty 5

## 2014-04-13 MED ORDER — DOXORUBICIN HCL CHEMO IV INJECTION 2 MG/ML
50.0000 mg/m2 | Freq: Once | INTRAVENOUS | Status: AC
  Administered 2014-04-13: 102 mg via INTRAVENOUS
  Filled 2014-04-13: qty 51

## 2014-04-13 MED ORDER — ALTEPLASE 2 MG IJ SOLR
2.0000 mg | Freq: Once | INTRAMUSCULAR | Status: AC | PRN
  Administered 2014-04-13: 2 mg
  Filled 2014-04-13: qty 2

## 2014-04-13 MED ORDER — SODIUM CHLORIDE 0.9 % IV SOLN
500.0000 mg/m2 | Freq: Once | INTRAVENOUS | Status: AC
  Administered 2014-04-13: 1020 mg via INTRAVENOUS
  Filled 2014-04-13: qty 51

## 2014-04-13 MED ORDER — HEPARIN SOD (PORK) LOCK FLUSH 100 UNIT/ML IV SOLN
500.0000 [IU] | Freq: Once | INTRAVENOUS | Status: AC | PRN
  Administered 2014-04-13: 500 [IU]
  Filled 2014-04-13: qty 5

## 2014-04-13 MED ORDER — PALONOSETRON HCL INJECTION 0.25 MG/5ML
0.2500 mg | Freq: Once | INTRAVENOUS | Status: AC
  Administered 2014-04-13: 0.25 mg via INTRAVENOUS

## 2014-04-13 MED ORDER — SODIUM CHLORIDE 0.9 % IJ SOLN
10.0000 mL | INTRAMUSCULAR | Status: DC | PRN
  Administered 2014-04-13: 10 mL
  Filled 2014-04-13: qty 10

## 2014-04-13 NOTE — Patient Instructions (Signed)
[facility] Discharge Instructions for Patients Receiving Chemotherapy  Today you received the following chemotherapy agents Adriamycin and Cytoxan.  To help prevent nausea and vomiting after your treatment, we encourage you to take your nausea medication as prescribed.   If you develop nausea and vomiting that is not controlled by your nausea medication, call the clinic.   BELOW ARE SYMPTOMS THAT SHOULD BE REPORTED IMMEDIATELY:  *FEVER GREATER THAN 100.5 F  *CHILLS WITH OR WITHOUT FEVER  NAUSEA AND VOMITING THAT IS NOT CONTROLLED WITH YOUR NAUSEA MEDICATION  *UNUSUAL SHORTNESS OF BREATH  *UNUSUAL BRUISING OR BLEEDING  TENDERNESS IN MOUTH AND THROAT WITH OR WITHOUT PRESENCE OF ULCERS  *URINARY PROBLEMS  *BOWEL PROBLEMS  UNUSUAL RASH Items with * indicate a potential emergency and should be followed up as soon as possible.  Feel free to call the clinic you have any questions or concerns. The clinic phone number is [phone].    

## 2014-04-13 NOTE — Progress Notes (Signed)
Patient Care Team: Hulan Fess, MD as PCP - General (Family Medicine)  DIAGNOSIS: Breast cancer of upper-outer quadrant of right female breast   Staging form: Breast, AJCC 7th Edition     Clinical: Stage IA (T1c, N0, cM0) - Signed by Thea Silversmith, MD on 02/18/2014     Pathologic: Stage IIA (T2, N0, cM0) - Signed by Rulon Eisenmenger, MD on 03/12/2014   SUMMARY OF ONCOLOGIC HISTORY:   Breast cancer of upper-outer quadrant of right female breast   02/27/2014 Surgery Right breast lumpectomy: Invasive ductal carcinoma grade 3, 2.5 cm, DCIS intermediate grade, margins negative, 2 SLN negative, ER/PR HER-2 negative   03/30/2014 -  Chemotherapy Adjuvant chemotherapy with dose dense Adriamycin Cytoxan x4 followed by Abraxane weekly x12    CHIEF COMPLIANT: cycle 2 day 1 of dose dense Adriamycin Cytoxan  INTERVAL HISTORY: Cheyenne Riggs is a 68 year old Caucasian female with above-mentioned history of right-sided breast cancer treated with lumpectomy and is currently on adjuvant chemotherapy. After cycle 1 of chemotherapy with Adriamycin Cytoxan, she had multiple problems including dizziness and hypotension. After stopping all of her antihypertensive medications, her blood pressure has come up. She had complete alopecia. She denies any nausea vomiting. She is otherwise doing extremely well.  REVIEW OF SYSTEMS:   Constitutional: Denies fevers, chills or abnormal weight loss Eyes: Denies blurriness of vision Ears, nose, mouth, throat, and face: Denies mucositis or sore throat Respiratory: Denies cough, dyspnea or wheezes Cardiovascular: Denies palpitation, chest discomfort or lower extremity swelling Gastrointestinal:  Denies nausea, heartburn or change in bowel habits Skin: Denies abnormal skin rashes Lymphatics: Denies new lymphadenopathy or easy bruising Neurological:Denies numbness, tingling or new weaknesses Behavioral/Psych: Mood is stable, no new changes  All other systems were reviewed with  the patient and are negative.  I have reviewed the past medical history, past surgical history, social history and family history with the patient and they are unchanged from previous note.  ALLERGIES:  is allergic to ciprofloxacin; demerol; lidocaine; other; septra; codeine; and novocain.  MEDICATIONS:  Current Outpatient Prescriptions  Medication Sig Dispense Refill  . acetaminophen (TYLENOL) 325 MG tablet Take 650 mg by mouth every 6 (six) hours as needed.    . ALPRAZolam (XANAX) 0.25 MG tablet Take 0.25 mg by mouth 2 (two) times daily as needed for anxiety.    Marland Kitchen aspirin 81 MG tablet Take 81 mg by mouth daily.    . cholecalciferol (VITAMIN D) 1000 UNITS tablet Take 2,000 Units by mouth daily.    . cyclobenzaprine (FLEXERIL) 5 MG tablet Take 5 mg by mouth 3 (three) times daily as needed for muscle spasms.    Marland Kitchen dexamethasone (DECADRON) 4 MG tablet Take 2 tablets by mouth once a day on the day after chemotherapy and then take 2 tablets two times a day for 2 days. Take with food. 30 tablet 1  . FLUoxetine (PROZAC) 10 MG capsule Take 10 mg by mouth daily.    Marland Kitchen lidocaine-prilocaine (EMLA) cream Apply 1 application topically as needed. 30 g 6  . LORazepam (ATIVAN) 0.5 MG tablet Take 1 tablet (0.5 mg total) by mouth every 6 (six) hours as needed for anxiety (nausea and vomiting). 30 tablet 0  . ondansetron (ZOFRAN) 8 MG tablet Take 1 tablet (8 mg total) by mouth 2 (two) times daily as needed. Start on the third day after chemotherapy. 30 tablet 1  . oxyCODONE-acetaminophen (PERCOCET/ROXICET) 5-325 MG per tablet   0  . Probiotic Product (PROBIOTIC DAILY PO) Take by  mouth daily.    . prochlorperazine (COMPAZINE) 10 MG tablet Take 1 tablet (10 mg total) by mouth every 6 (six) hours as needed (Nausea or vomiting). 30 tablet 1  . ranitidine (ZANTAC) 150 MG tablet Take 150 mg by mouth as needed.     . rosuvastatin (CRESTOR) 20 MG tablet Take 20 mg by mouth daily.    Marland Kitchen UNABLE TO FIND Apply 1 each  topically as needed. 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  ICD-10 C50.411 1 each 0  . valsartan (DIOVAN) 320 MG tablet Take 320 mg by mouth daily.     No current facility-administered medications for this visit.    PHYSICAL EXAMINATION: ECOG PERFORMANCE STATUS: 1 - Symptomatic but completely ambulatory  Filed Vitals:   04/13/14 1401  BP: 164/73  Pulse: 98  Temp: 98.1 F (36.7 C)  Resp: 19   Filed Weights   04/13/14 1401  Weight: 207 lb 9.6 oz (94.167 kg)    GENERAL:alert, no distress and comfortable SKIN: skin color, texture, turgor are normal, no rashes or significant lesions EYES: normal, Conjunctiva are pink and non-injected, sclera clear OROPHARYNX:no exudate, no erythema and lips, buccal mucosa, and tongue normal  NECK: supple, thyroid normal size, non-tender, without nodularity LYMPH:  no palpable lymphadenopathy in the cervical, axillary or inguinal LUNGS: clear to auscultation and percussion with normal breathing effort HEART: regular rate & rhythm and no murmurs and no lower extremity edema ABDOMEN:abdomen soft, non-tender and normal bowel sounds Musculoskeletal:no cyanosis of digits and no clubbing  NEURO: alert & oriented x 3 with fluent speech, no focal motor/sensory deficits  LABORATORY DATA:  I have reviewed the data as listed   Chemistry      Component Value Date/Time   NA 142 04/13/2014 1349   NA 140 02/25/2014 1030   K 3.7 04/13/2014 1349   K 3.9 02/25/2014 1030   CL 100 02/25/2014 1030   CO2 23 04/13/2014 1349   CO2 24 02/25/2014 1030   BUN 13.4 04/13/2014 1349   BUN 17 02/25/2014 1030   CREATININE 1.3* 04/13/2014 1349   CREATININE 1.19* 02/25/2014 1030      Component Value Date/Time   CALCIUM 9.0 04/13/2014 1349   CALCIUM 9.5 02/25/2014 1030   ALKPHOS 92 04/13/2014 1349   ALKPHOS 131* 09/22/2009 1010   AST 18 04/13/2014 1349   AST 156* 09/22/2009 1010   ALT 19 04/13/2014 1349   ALT  110* 09/22/2009 1010   BILITOT 0.21 04/13/2014 1349   BILITOT 1.0 09/22/2009 1010       Lab Results  Component Value Date   WBC 14.1* 04/13/2014   HGB 11.3* 04/13/2014   HCT 35.4 04/13/2014   MCV 81.7 04/13/2014   PLT 248 04/13/2014   NEUTROABS 12.0* 04/13/2014    ASSESSMENT & PLAN:  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 Patient started systemic adjuvant chemotherapy with dose dense Adriamycin Cytoxan. She started chemotherapy in 03/30/2014. Today is cycle 2 day 1.  Severe neutropenia due to cycle 1 chemotherapy, ANC had went down to 0. I decreased the dosage of today's chemotherapy dose. We will see her back in one week to followup on CBC with differential and for toxicity check.  Low blood pressure previously but now hypertensive: Today her blood pressure is 858 systolic. I discussed with  her to reinitiate her antihypertensive medication half a tablet daily.  Closely monitoring for toxicities   Orders Placed This Encounter  Procedures  . CBC with Differential    Standing Status: Future     Number of Occurrences:      Standing Expiration Date: 04/13/2015  . Comprehensive metabolic panel (Cmet) - [facility]    Standing Status: Future     Number of Occurrences:      Standing Expiration Date: 04/13/2015   The patient has a good understanding of the overall plan. she agrees with it. She will call with any problems that may develop before her next visit here.   Rulon Eisenmenger, MD 04/13/2014 2:57 PM

## 2014-04-13 NOTE — Assessment & Plan Note (Addendum)
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 Patient started systemic adjuvant chemotherapy with dose dense Adriamycin Cytoxan. She started chemotherapy in 03/30/2014. Today is cycle 2 day 1.  Severe neutropenia due to cycle 1 chemotherapy, ANC had went down to 0. I decreased the dosage of today's chemotherapy dose. We will see her back in one week to followup on CBC with differential and for toxicity check.  Low blood pressure now hypertensive: Today her blood pressure is 861 systolic. I discussed with her to reinitiate her antihypertensive medication half a tablet daily.  Closely monitoring for toxicities

## 2014-04-14 ENCOUNTER — Ambulatory Visit (HOSPITAL_BASED_OUTPATIENT_CLINIC_OR_DEPARTMENT_OTHER): Payer: Medicare Other

## 2014-04-14 ENCOUNTER — Telehealth: Payer: Self-pay | Admitting: Hematology and Oncology

## 2014-04-14 ENCOUNTER — Other Ambulatory Visit: Payer: Self-pay | Admitting: Emergency Medicine

## 2014-04-14 ENCOUNTER — Telehealth: Payer: Self-pay | Admitting: *Deleted

## 2014-04-14 DIAGNOSIS — D701 Agranulocytosis secondary to cancer chemotherapy: Secondary | ICD-10-CM | POA: Diagnosis not present

## 2014-04-14 DIAGNOSIS — C50411 Malignant neoplasm of upper-outer quadrant of right female breast: Secondary | ICD-10-CM

## 2014-04-14 MED ORDER — PEGFILGRASTIM INJECTION 6 MG/0.6ML ~~LOC~~
6.0000 mg | PREFILLED_SYRINGE | Freq: Once | SUBCUTANEOUS | Status: AC
  Administered 2014-04-14: 6 mg via SUBCUTANEOUS
  Filled 2014-04-14: qty 0.6

## 2014-04-14 NOTE — Telephone Encounter (Signed)
Per staff message and POF I have scheduled appts. Advised scheduler of appts. JMW  

## 2014-04-14 NOTE — Telephone Encounter (Signed)
, °

## 2014-04-14 NOTE — Patient Instructions (Signed)
Pegfilgrastim injection What is this medicine? PEGFILGRASTIM (peg fil GRA stim) is a long-acting granulocyte colony-stimulating factor that stimulates the growth of neutrophils, a type of white blood cell important in the body's fight against infection. It is used to reduce the incidence of fever and infection in patients with certain types of cancer who are receiving chemotherapy that affects the bone marrow. This medicine may be used for other purposes; ask your health care provider or pharmacist if you have questions. COMMON BRAND NAME(S): Neulasta What should I tell my health care provider before I take this medicine? They need to know if you have any of these conditions: -latex allergy -ongoing radiation therapy -sickle cell disease -skin reactions to acrylic adhesives (On-Body Injector only) -an unusual or allergic reaction to pegfilgrastim, filgrastim, other medicines, foods, dyes, or preservatives -pregnant or trying to get pregnant -breast-feeding How should I use this medicine? This medicine is for injection under the skin. If you get this medicine at home, you will be taught how to prepare and give the pre-filled syringe or how to use the On-body Injector. Refer to the patient Instructions for Use for detailed instructions. Use exactly as directed. Take your medicine at regular intervals. Do not take your medicine more often than directed. It is important that you put your used needles and syringes in a special sharps container. Do not put them in a trash can. If you do not have a sharps container, call your pharmacist or healthcare provider to get one. Talk to your pediatrician regarding the use of this medicine in children. Special care may be needed. Overdosage: If you think you have taken too much of this medicine contact a poison control center or emergency room at once. NOTE: This medicine is only for you. Do not share this medicine with others. What if I miss a dose? It is  important not to miss your dose. Call your doctor or health care professional if you miss your dose. If you miss a dose due to an On-body Injector failure or leakage, a new dose should be administered as soon as possible using a single prefilled syringe for manual use. What may interact with this medicine? Interactions have not been studied. Give your health care provider a list of all the medicines, herbs, non-prescription drugs, or dietary supplements you use. Also tell them if you smoke, drink alcohol, or use illegal drugs. Some items may interact with your medicine. This list may not describe all possible interactions. Give your health care provider a list of all the medicines, herbs, non-prescription drugs, or dietary supplements you use. Also tell them if you smoke, drink alcohol, or use illegal drugs. Some items may interact with your medicine. What should I watch for while using this medicine? You may need blood work done while you are taking this medicine. If you are going to need a MRI, CT scan, or other procedure, tell your doctor that you are using this medicine (On-Body Injector only). What side effects may I notice from receiving this medicine? Side effects that you should report to your doctor or health care professional as soon as possible: -allergic reactions like skin rash, itching or hives, swelling of the face, lips, or tongue -dizziness -fever -pain, redness, or irritation at site where injected -pinpoint red spots on the skin -shortness of breath or breathing problems -stomach or side pain, or pain at the shoulder -swelling -tiredness -trouble passing urine Side effects that usually do not require medical attention (report to your doctor   or health care professional if they continue or are bothersome): -bone pain -muscle pain This list may not describe all possible side effects. Call your doctor for medical advice about side effects. You may report side effects to FDA at  1-800-FDA-1088. Where should I keep my medicine? Keep out of the reach of children. Store pre-filled syringes in a refrigerator between 2 and 8 degrees C (36 and 46 degrees F). Do not freeze. Keep in carton to protect from light. Throw away this medicine if it is left out of the refrigerator for more than 48 hours. Throw away any unused medicine after the expiration date. NOTE: This sheet is a summary. It may not cover all possible information. If you have questions about this medicine, talk to your doctor, pharmacist, or health care provider.  2015, [employer]vier/Gold Standard. (2013-07-31 16:14:05)  

## 2014-04-20 ENCOUNTER — Telehealth: Payer: Self-pay | Admitting: Oncology

## 2014-04-20 ENCOUNTER — Other Ambulatory Visit (HOSPITAL_BASED_OUTPATIENT_CLINIC_OR_DEPARTMENT_OTHER): Payer: Medicare Other

## 2014-04-20 ENCOUNTER — Ambulatory Visit (HOSPITAL_BASED_OUTPATIENT_CLINIC_OR_DEPARTMENT_OTHER): Payer: Medicare Other

## 2014-04-20 ENCOUNTER — Telehealth: Payer: Self-pay | Admitting: Hematology and Oncology

## 2014-04-20 ENCOUNTER — Other Ambulatory Visit: Payer: Self-pay | Admitting: *Deleted

## 2014-04-20 ENCOUNTER — Ambulatory Visit (HOSPITAL_BASED_OUTPATIENT_CLINIC_OR_DEPARTMENT_OTHER): Payer: Medicare Other | Admitting: Hematology and Oncology

## 2014-04-20 ENCOUNTER — Ambulatory Visit: Payer: Medicare Other

## 2014-04-20 VITALS — BP 123/54 | HR 90 | Temp 98.1°F | Resp 19 | Ht 63.0 in | Wt 202.5 lb

## 2014-04-20 DIAGNOSIS — D701 Agranulocytosis secondary to cancer chemotherapy: Secondary | ICD-10-CM

## 2014-04-20 DIAGNOSIS — C50411 Malignant neoplasm of upper-outer quadrant of right female breast: Secondary | ICD-10-CM

## 2014-04-20 DIAGNOSIS — Z452 Encounter for adjustment and management of vascular access device: Secondary | ICD-10-CM

## 2014-04-20 DIAGNOSIS — Z95828 Presence of other vascular implants and grafts: Secondary | ICD-10-CM

## 2014-04-20 LAB — COMPREHENSIVE METABOLIC PANEL (CC13)
ALBUMIN: 2.9 g/dL — AB (ref 3.5–5.0)
ALK PHOS: 87 U/L (ref 40–150)
ALT: 20 U/L (ref 0–55)
AST: 12 U/L (ref 5–34)
Anion Gap: 10 mEq/L (ref 3–11)
BUN: 20.2 mg/dL (ref 7.0–26.0)
CO2: 27 mEq/L (ref 22–29)
Calcium: 8.9 mg/dL (ref 8.4–10.4)
Chloride: 100 mEq/L (ref 98–109)
Creatinine: 1 mg/dL (ref 0.6–1.1)
EGFR: 56 mL/min/{1.73_m2} — ABNORMAL LOW (ref >90)
Glucose: 160 mg/dl — ABNORMAL HIGH (ref 70–140)
POTASSIUM: 3.6 meq/L (ref 3.5–5.1)
SODIUM: 137 meq/L (ref 136–145)
TOTAL PROTEIN: 6 g/dL — AB (ref 6.4–8.3)
Total Bilirubin: 0.75 mg/dL (ref 0.20–1.20)

## 2014-04-20 LAB — CBC WITH DIFFERENTIAL/PLATELET
BASO%: 0 % (ref 0.0–2.0)
Basophils Absolute: 0 10*3/uL (ref 0.0–0.1)
EOS%: 3 % (ref 0.0–7.0)
Eosinophils Absolute: 0 10*3/uL (ref 0.0–0.5)
HCT: 31.7 % — ABNORMAL LOW (ref 34.8–46.6)
HGB: 10.2 g/dL — ABNORMAL LOW (ref 11.6–15.9)
LYMPH%: 74.2 % — ABNORMAL HIGH (ref 14.0–49.7)
MCH: 26.1 pg (ref 25.1–34.0)
MCHC: 32.2 g/dL (ref 31.5–36.0)
MCV: 81.2 fL (ref 79.5–101.0)
MONO#: 0.1 10*3/uL (ref 0.1–0.9)
MONO%: 16.3 % — AB (ref 0.0–14.0)
NEUT%: 6.5 % — ABNORMAL LOW (ref 38.4–76.8)
NEUTROS ABS: 0 10*3/uL — AB (ref 1.5–6.5)
Platelets: 141 10*3/uL — ABNORMAL LOW (ref 145–400)
RBC: 3.91 10*6/uL (ref 3.70–5.45)
RDW: 14.3 % (ref 11.2–14.5)
WBC: 0.3 10*3/uL — CL (ref 3.9–10.3)
lymph#: 0.3 10*3/uL — ABNORMAL LOW (ref 0.9–3.3)

## 2014-04-20 MED ORDER — SODIUM CHLORIDE 0.9 % IJ SOLN
10.0000 mL | INTRAMUSCULAR | Status: DC | PRN
  Administered 2014-04-20: 10 mL via INTRAVENOUS
  Filled 2014-04-20: qty 10

## 2014-04-20 MED ORDER — HEPARIN SOD (PORK) LOCK FLUSH 100 UNIT/ML IV SOLN
500.0000 [IU] | Freq: Once | INTRAVENOUS | Status: AC
  Administered 2014-04-20: 500 [IU] via INTRAVENOUS
  Filled 2014-04-20: qty 5

## 2014-04-20 MED ORDER — LORAZEPAM 0.5 MG PO TABS: 0.5000 mg | ORAL_TABLET | Freq: Four times a day (QID) | ORAL | Status: DC | PRN

## 2014-04-20 NOTE — Progress Notes (Signed)
Patient Care Team: Hulan Fess, MD as PCP - General (Family Medicine)  DIAGNOSIS: Breast cancer of upper-outer quadrant of right female breast   Staging form: Breast, AJCC 7th Edition     Clinical: Stage IA (T1c, N0, cM0) - Signed by Thea Silversmith, MD on 02/18/2014     Pathologic: Stage IIA (T2, N0, cM0) - Signed by Rulon Eisenmenger, MD on 03/12/2014   SUMMARY OF ONCOLOGIC HISTORY:   Breast cancer of upper-outer quadrant of right female breast   02/27/2014 Surgery Right breast lumpectomy: Invasive ductal carcinoma grade 3, 2.5 cm, DCIS intermediate grade, margins negative, 2 SLN negative, ER/PR HER-2 negative   03/30/2014 -  Chemotherapy Adjuvant chemotherapy with dose dense Adriamycin Cytoxan x4 followed by Abraxane weekly x12    CHIEF COMPLIANT: Cycle 2 day 8 of dose dense Adriamycin and Cytoxan  INTERVAL HISTORY: Cheyenne Riggs is a 68 year old Caucasian with above-mentioned history of right-sided breast cancer treated with lumpectomy and is currently on adjuvant chemotherapy with dose dense Adriamycin and Cytoxan. We can reduce the dose of chemotherapy after cycle 1 because of severe neutropenia. She received cycle 2 of chemotherapy week ago and is here for toxicity check. Chemotherapy went until 6:30 in theevening because of problems with the port. Otherwise she did not have any nausea vomiting or any other issues.she takes Compazine and that usually resolves her nausea issues. She is also having runny nose and slight cough. She took Benadryl which helped her.  REVIEW OF SYSTEMS:   Constitutional: Denies fevers, chills or abnormal weight loss Eyes: Denies blurriness of vision Ears, nose, mouth, throat, and face: postnasal drip Respiratory: Denies cough, dyspnea or wheezes Cardiovascular: Denies palpitation, chest discomfort or lower extremity swelling Gastrointestinal:  Denies nausea, heartburn or change in bowel habits Skin: Denies abnormal skin rashes Lymphatics: Denies new  lymphadenopathy or easy bruising Neurological:Denies numbness, tingling or new weaknesses Behavioral/Psych: Mood is stable, no new changes  Breast:  denies any pain or lumps or nodules in either breasts All other systems were reviewed with the patient and are negative.  I have reviewed the past medical history, past surgical history, social history and family history with the patient and they are unchanged from previous note.  ALLERGIES:  is allergic to ciprofloxacin; demerol; lidocaine; other; septra; codeine; and novocain.  MEDICATIONS:  Current Outpatient Prescriptions  Medication Sig Dispense Refill  . acetaminophen (TYLENOL) 325 MG tablet Take 650 mg by mouth every 6 (six) hours as needed.    . ALPRAZolam (XANAX) 0.25 MG tablet Take 0.25 mg by mouth 2 (two) times daily as needed for anxiety.    Marland Kitchen aspirin 81 MG tablet Take 81 mg by mouth daily.    . cholecalciferol (VITAMIN D) 1000 UNITS tablet Take 2,000 Units by mouth daily.    . cyclobenzaprine (FLEXERIL) 5 MG tablet Take 5 mg by mouth 3 (three) times daily as needed for muscle spasms.    Marland Kitchen dexamethasone (DECADRON) 4 MG tablet Take 2 tablets by mouth once a day on the day after chemotherapy and then take 2 tablets two times a day for 2 days. Take with food. 30 tablet 1  . FLUoxetine (PROZAC) 10 MG capsule Take 10 mg by mouth daily.    Marland Kitchen lidocaine-prilocaine (EMLA) cream Apply 1 application topically as needed. 30 g 6  . LORazepam (ATIVAN) 0.5 MG tablet Take 1 tablet (0.5 mg total) by mouth every 6 (six) hours as needed for anxiety (nausea and vomiting). 30 tablet 0  . ondansetron (ZOFRAN)  8 MG tablet Take 1 tablet (8 mg total) by mouth 2 (two) times daily as needed. Start on the third day after chemotherapy. 30 tablet 1  . oxyCODONE-acetaminophen (PERCOCET/ROXICET) 5-325 MG per tablet   0  . Probiotic Product (PROBIOTIC DAILY PO) Take by mouth daily.    . prochlorperazine (COMPAZINE) 10 MG tablet Take 1 tablet (10 mg total) by mouth  every 6 (six) hours as needed (Nausea or vomiting). 30 tablet 1  . ranitidine (ZANTAC) 150 MG tablet Take 150 mg by mouth as needed.     . rosuvastatin (CRESTOR) 20 MG tablet Take 20 mg by mouth daily.    Marland Kitchen UNABLE TO FIND Apply 1 each topically as needed. 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  ICD-10 C50.411 1 each 0  . valsartan (DIOVAN) 320 MG tablet Take 320 mg by mouth daily.     No current facility-administered medications for this visit.    PHYSICAL EXAMINATION: ECOG PERFORMANCE STATUS: 0 - Asymptomatic  Filed Vitals:   04/20/14 1115  BP: 123/54  Pulse: 90  Temp: 98.1 F (36.7 C)  Resp: 19   Filed Weights   04/20/14 1115  Weight: 202 lb 8 oz (91.853 kg)    GENERAL:alert, no distress and comfortable SKIN: skin color, texture, turgor are normal, no rashes or significant lesions EYES: normal, Conjunctiva are pink and non-injected, sclera clear OROPHARYNX:no exudate, no erythema and lips, buccal mucosa, and tongue normal  NECK: supple, thyroid normal size, non-tender, without nodularity LYMPH:  no palpable lymphadenopathy in the cervical, axillary or inguinal LUNGS: clear to auscultation and percussion with normal breathing effort HEART: regular rate & rhythm and no murmurs and no lower extremity edema ABDOMEN:abdomen soft, non-tender and normal bowel sounds Musculoskeletal:no cyanosis of digits and no clubbing  NEURO: alert & oriented x 3 with fluent speech, no focal motor/sensory deficits  LABORATORY DATA:  I have reviewed the data as listed   Chemistry      Component Value Date/Time   NA 137 04/20/2014 1057   NA 140 02/25/2014 1030   K 3.6 04/20/2014 1057   K 3.9 02/25/2014 1030   CL 100 02/25/2014 1030   CO2 27 04/20/2014 1057   CO2 24 02/25/2014 1030   BUN 20.2 04/20/2014 1057   BUN 17 02/25/2014 1030   CREATININE 1.0 04/20/2014 1057   CREATININE 1.19* 02/25/2014 1030      Component Value Date/Time    CALCIUM 8.9 04/20/2014 1057   CALCIUM 9.5 02/25/2014 1030   ALKPHOS 87 04/20/2014 1057   ALKPHOS 131* 09/22/2009 1010   AST 12 04/20/2014 1057   AST 156* 09/22/2009 1010   ALT 20 04/20/2014 1057   ALT 110* 09/22/2009 1010   BILITOT 0.75 04/20/2014 1057   BILITOT 1.0 09/22/2009 1010       Lab Results  Component Value Date   WBC 0.3* 04/20/2014   HGB 10.2* 04/20/2014   HCT 31.7* 04/20/2014   MCV 81.2 04/20/2014   PLT 141* 04/20/2014   NEUTROABS 0.0* 04/20/2014    ASSESSMENT & PLAN:  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 Patient started systemic adjuvant chemotherapy with dose dense Adriamycin Cytoxan. She started chemotherapy in 03/30/2014. Dose of chemotherapy was reduced after cycle 1 for severe neutropenia. Today is cycle 2 day 8. In spite of  dose reduction, her neutrophil count is 0 today. Other than this she has no major side effects of treatment.  Chemotoxicities: 1. Neutropenia grade 3: Required dose reduction for cycle 2 of chemotherapy 2. Alopecia  Return to clinic in one week for cycle 3.   Orders Placed This Encounter  Procedures  . CBC with Differential    Standing Status: Future     Number of Occurrences:      Standing Expiration Date: 04/20/2015  . Comprehensive metabolic panel (Cmet) - [facility]    Standing Status: Future     Number of Occurrences:      Standing Expiration Date: 04/20/2015   The patient has a good understanding of the overall plan. she agrees with it. She will call with any problems that may develop before her next visit here.   Rulon Eisenmenger, MD 04/20/2014 12:31 PM

## 2014-04-20 NOTE — Patient Instructions (Signed)

## 2014-04-20 NOTE — Assessment & Plan Note (Addendum)
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 Patient started systemic adjuvant chemotherapy with dose dense Adriamycin Cytoxan. She started chemotherapy in 03/30/2014. Dose of chemotherapy was reduced after cycle 1 for severe neutropenia. Today is cycle 2 day 8. In spite of dose reduction, her neutrophil count is 0 today. Other than this she has no major side effects of treatment.  Chemotoxicities: 1. Neutropenia grade 3: Required dose reduction for cycle 2 of chemotherapy 2. Alopecia  Return to clinic in one week for cycle 3.

## 2014-04-20 NOTE — Telephone Encounter (Signed)
, °

## 2014-04-27 ENCOUNTER — Ambulatory Visit (HOSPITAL_BASED_OUTPATIENT_CLINIC_OR_DEPARTMENT_OTHER): Payer: Medicare Other | Admitting: Oncology

## 2014-04-27 ENCOUNTER — Other Ambulatory Visit: Payer: Medicare Other

## 2014-04-27 ENCOUNTER — Ambulatory Visit: Payer: Medicare Other | Admitting: Oncology

## 2014-04-27 ENCOUNTER — Encounter: Payer: Self-pay | Admitting: Oncology

## 2014-04-27 ENCOUNTER — Telehealth: Payer: Self-pay | Admitting: Oncology

## 2014-04-27 ENCOUNTER — Other Ambulatory Visit (HOSPITAL_BASED_OUTPATIENT_CLINIC_OR_DEPARTMENT_OTHER): Payer: Medicare Other

## 2014-04-27 ENCOUNTER — Ambulatory Visit (HOSPITAL_BASED_OUTPATIENT_CLINIC_OR_DEPARTMENT_OTHER): Payer: Medicare Other

## 2014-04-27 VITALS — BP 132/63 | HR 93 | Temp 98.3°F | Resp 18 | Ht 63.0 in | Wt 198.8 lb

## 2014-04-27 DIAGNOSIS — C50411 Malignant neoplasm of upper-outer quadrant of right female breast: Secondary | ICD-10-CM | POA: Diagnosis not present

## 2014-04-27 DIAGNOSIS — Z5111 Encounter for antineoplastic chemotherapy: Secondary | ICD-10-CM

## 2014-04-27 DIAGNOSIS — D709 Neutropenia, unspecified: Secondary | ICD-10-CM

## 2014-04-27 LAB — CBC WITH DIFFERENTIAL/PLATELET
BASO%: 0.4 % (ref 0.0–2.0)
Basophils Absolute: 0.1 10*3/uL (ref 0.0–0.1)
EOS%: 0 % (ref 0.0–7.0)
Eosinophils Absolute: 0 10*3/uL (ref 0.0–0.5)
HEMATOCRIT: 33.9 % — AB (ref 34.8–46.6)
HGB: 10.8 g/dL — ABNORMAL LOW (ref 11.6–15.9)
LYMPH%: 5.5 % — AB (ref 14.0–49.7)
MCH: 26 pg (ref 25.1–34.0)
MCHC: 32 g/dL (ref 31.5–36.0)
MCV: 81.4 fL (ref 79.5–101.0)
MONO#: 0.8 10*3/uL (ref 0.1–0.9)
MONO%: 5.1 % (ref 0.0–14.0)
NEUT#: 13.2 10*3/uL — ABNORMAL HIGH (ref 1.5–6.5)
NEUT%: 89 % — AB (ref 38.4–76.8)
PLATELETS: 255 10*3/uL (ref 145–400)
RBC: 4.16 10*6/uL (ref 3.70–5.45)
RDW: 14.6 % — ABNORMAL HIGH (ref 11.2–14.5)
WBC: 14.9 10*3/uL — ABNORMAL HIGH (ref 3.9–10.3)
lymph#: 0.8 10*3/uL — ABNORMAL LOW (ref 0.9–3.3)

## 2014-04-27 LAB — COMPREHENSIVE METABOLIC PANEL (CC13)
ALT: 34 U/L (ref 0–55)
AST: 22 U/L (ref 5–34)
Albumin: 2.9 g/dL — ABNORMAL LOW (ref 3.5–5.0)
Alkaline Phosphatase: 98 U/L (ref 40–150)
Anion Gap: 13 mEq/L — ABNORMAL HIGH (ref 3–11)
BUN: 16.1 mg/dL (ref 7.0–26.0)
CO2: 27 meq/L (ref 22–29)
CREATININE: 1.1 mg/dL (ref 0.6–1.1)
Calcium: 9.4 mg/dL (ref 8.4–10.4)
Chloride: 100 mEq/L (ref 98–109)
EGFR: 49 mL/min/{1.73_m2} — ABNORMAL LOW (ref >90)
Glucose: 131 mg/dl (ref 70–140)
Potassium: 3.5 mEq/L (ref 3.5–5.1)
Sodium: 140 mEq/L (ref 136–145)
Total Bilirubin: 0.3 mg/dL (ref 0.20–1.20)
Total Protein: 6.7 g/dL (ref 6.4–8.3)

## 2014-04-27 MED ORDER — PALONOSETRON HCL INJECTION 0.25 MG/5ML
INTRAVENOUS | Status: AC
Start: 2014-04-27 — End: 2014-04-27
  Filled 2014-04-27: qty 5

## 2014-04-27 MED ORDER — SODIUM CHLORIDE 0.9 % IV SOLN
150.0000 mg | Freq: Once | INTRAVENOUS | Status: AC
  Administered 2014-04-27: 150 mg via INTRAVENOUS
  Filled 2014-04-27: qty 5

## 2014-04-27 MED ORDER — SODIUM CHLORIDE 0.9 % IJ SOLN
10.0000 mL | INTRAMUSCULAR | Status: DC | PRN
  Administered 2014-04-27: 10 mL
  Filled 2014-04-27: qty 10

## 2014-04-27 MED ORDER — PALONOSETRON HCL INJECTION 0.25 MG/5ML
0.2500 mg | Freq: Once | INTRAVENOUS | Status: AC
  Administered 2014-04-27: 0.25 mg via INTRAVENOUS

## 2014-04-27 MED ORDER — DEXAMETHASONE SODIUM PHOSPHATE 20 MG/5ML IJ SOLN
12.0000 mg | Freq: Once | INTRAMUSCULAR | Status: AC
  Administered 2014-04-27: 12 mg via INTRAVENOUS

## 2014-04-27 MED ORDER — SODIUM CHLORIDE 0.9 % IV SOLN
Freq: Once | INTRAVENOUS | Status: AC
  Administered 2014-04-27: 13:00:00 via INTRAVENOUS

## 2014-04-27 MED ORDER — HEPARIN SOD (PORK) LOCK FLUSH 100 UNIT/ML IV SOLN
500.0000 [IU] | Freq: Once | INTRAVENOUS | Status: AC | PRN
  Administered 2014-04-27: 500 [IU]
  Filled 2014-04-27: qty 5

## 2014-04-27 MED ORDER — SODIUM CHLORIDE 0.9 % IV SOLN
500.0000 mg/m2 | Freq: Once | INTRAVENOUS | Status: AC
  Administered 2014-04-27: 1020 mg via INTRAVENOUS
  Filled 2014-04-27: qty 51

## 2014-04-27 MED ORDER — DEXAMETHASONE SODIUM PHOSPHATE 20 MG/5ML IJ SOLN
INTRAMUSCULAR | Status: AC
  Filled 2014-04-27: qty 5

## 2014-04-27 MED ORDER — DOXORUBICIN HCL CHEMO IV INJECTION 2 MG/ML
50.0000 mg/m2 | Freq: Once | INTRAVENOUS | Status: AC
  Administered 2014-04-27: 102 mg via INTRAVENOUS
  Filled 2014-04-27: qty 51

## 2014-04-27 NOTE — Progress Notes (Signed)
Patient Care Team: Hulan Fess, MD as PCP - General (Family Medicine)  DIAGNOSIS: Breast cancer of upper-outer quadrant of right female breast   Staging form: Breast, AJCC 7th Edition     Clinical: Stage IA (T1c, N0, cM0) - Signed by Thea Silversmith, MD on 02/18/2014     Pathologic: Stage IIA (T2, N0, cM0) - Signed by Rulon Eisenmenger, MD on 03/12/2014   SUMMARY OF ONCOLOGIC HISTORY:   Breast cancer of upper-outer quadrant of right female breast   02/27/2014 Surgery Right breast lumpectomy: Invasive ductal carcinoma grade 3, 2.5 cm, DCIS intermediate grade, margins negative, 2 SLN negative, ER/PR HER-2 negative   03/30/2014 -  Chemotherapy Adjuvant chemotherapy with dose dense Adriamycin Cytoxan x4 followed by Abraxane weekly x12    CHIEF COMPLIANT: Cycle 3 day 1 of dose dense Adriamycin and Cytoxan  INTERVAL HISTORY: Cheyenne Riggs is a 68 year old Caucasian with above-mentioned history of right-sided breast cancer treated with lumpectomy and is currently on adjuvant chemotherapy with dose dense Adriamycin and Cytoxan. Chemotherapy dose was reduced after cycle 1 because of severe neutropenia. She does not have any nausea vomiting. She is also having runny nose and slight cough. She took Benadryl which helped her. Denies fevers.   REVIEW OF SYSTEMS:   Constitutional: Denies fevers, chills or abnormal weight loss Eyes: Denies blurriness of vision Ears, nose, mouth, throat, and face: postnasal drip Respiratory: Denies cough, dyspnea or wheezes Cardiovascular: Denies palpitation, chest discomfort or lower extremity swelling Gastrointestinal:  Denies nausea, heartburn or change in bowel habits Skin: Denies abnormal skin rashes Lymphatics: Denies new lymphadenopathy or easy bruising Neurological:Denies numbness, tingling or new weaknesses Behavioral/Psych: Mood is stable, no new changes  Breast:  denies any pain or lumps or nodules in either breasts All other systems were reviewed with the  patient and are negative.  I have reviewed the past medical history, past surgical history, social history and family history with the patient and they are unchanged from previous note.  ALLERGIES:  is allergic to ciprofloxacin; demerol; lidocaine; other; septra; codeine; and novocain.  MEDICATIONS:  Current Outpatient Prescriptions  Medication Sig Dispense Refill  . acetaminophen (TYLENOL) 325 MG tablet Take 650 mg by mouth every 6 (six) hours as needed.    . cholecalciferol (VITAMIN D) 1000 UNITS tablet Take 2,000 Units by mouth daily.    Marland Kitchen dexamethasone (DECADRON) 4 MG tablet Take 2 tablets by mouth once a day on the day after chemotherapy and then take 2 tablets two times a day for 2 days. Take with food. 30 tablet 1  . FLUoxetine (PROZAC) 10 MG capsule Take 10 mg by mouth daily.    Marland Kitchen lidocaine-prilocaine (EMLA) cream Apply 1 application topically as needed. 30 g 6  . LORazepam (ATIVAN) 0.5 MG tablet Take 1 tablet (0.5 mg total) by mouth every 6 (six) hours as needed for anxiety (nausea and vomiting). 30 tablet 0  . ondansetron (ZOFRAN) 8 MG tablet Take 1 tablet (8 mg total) by mouth 2 (two) times daily as needed. Start on the third day after chemotherapy. 30 tablet 1  . Probiotic Product (PROBIOTIC DAILY PO) Take by mouth daily.    . ranitidine (ZANTAC) 150 MG tablet Take 150 mg by mouth as needed.     Marland Kitchen UNABLE TO FIND Apply 1 each topically as needed. 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  ICD-10 C50.411 1 each 0  . valsartan (DIOVAN) 320 MG tablet Take 320 mg by mouth  daily.    Marland Kitchen ALPRAZolam (XANAX) 0.25 MG tablet Take 0.25 mg by mouth 2 (two) times daily as needed for anxiety.    Marland Kitchen aspirin 81 MG tablet Take 81 mg by mouth daily.    . cyclobenzaprine (FLEXERIL) 5 MG tablet Take 5 mg by mouth 3 (three) times daily as needed for muscle spasms.    Marland Kitchen oxyCODONE-acetaminophen (PERCOCET/ROXICET) 5-325 MG per tablet   0  . prochlorperazine  (COMPAZINE) 10 MG tablet Take 1 tablet (10 mg total) by mouth every 6 (six) hours as needed (Nausea or vomiting). (Patient not taking: Reported on 04/27/2014) 30 tablet 1  . rosuvastatin (CRESTOR) 20 MG tablet Take 20 mg by mouth daily.     No current facility-administered medications for this visit.   Facility-Administered Medications Ordered in Other Visits  Medication Dose Route Frequency Provider Last Rate Last Dose  . sodium chloride 0.9 % injection 10 mL  10 mL Intracatheter PRN Rulon Eisenmenger, MD   10 mL at 04/27/14 1457    PHYSICAL EXAMINATION: ECOG PERFORMANCE STATUS: 0 - Asymptomatic  Filed Vitals:   04/27/14 1103  BP: 132/63  Pulse: 93  Temp: 98.3 F (36.8 C)  Resp: 18   Filed Weights   04/27/14 1103  Weight: 198 lb 12.8 oz (90.175 kg)    GENERAL:alert, no distress and comfortable SKIN: skin color, texture, turgor are normal, no rashes or significant lesions EYES: normal, Conjunctiva are pink and non-injected, sclera clear OROPHARYNX:no exudate, no erythema and lips, buccal mucosa, and tongue normal  NECK: supple, thyroid normal size, non-tender, without nodularity LYMPH:  no palpable lymphadenopathy in the cervical, axillary or inguinal LUNGS: clear to auscultation and percussion with normal breathing effort HEART: regular rate & rhythm and no murmurs and no lower extremity edema ABDOMEN:abdomen soft, non-tender and normal bowel sounds Musculoskeletal:no cyanosis of digits and no clubbing  NEURO: alert & oriented x 3 with fluent speech, no focal motor/sensory deficits  LABORATORY DATA:  I have reviewed the data as listed   Chemistry      Component Value Date/Time   NA 140 04/27/2014 1049   NA 140 02/25/2014 1030   K 3.5 04/27/2014 1049   K 3.9 02/25/2014 1030   CL 100 02/25/2014 1030   CO2 27 04/27/2014 1049   CO2 24 02/25/2014 1030   BUN 16.1 04/27/2014 1049   BUN 17 02/25/2014 1030   CREATININE 1.1 04/27/2014 1049   CREATININE 1.19* 02/25/2014 1030       Component Value Date/Time   CALCIUM 9.4 04/27/2014 1049   CALCIUM 9.5 02/25/2014 1030   ALKPHOS 98 04/27/2014 1049   ALKPHOS 131* 09/22/2009 1010   AST 22 04/27/2014 1049   AST 156* 09/22/2009 1010   ALT 34 04/27/2014 1049   ALT 110* 09/22/2009 1010   BILITOT 0.30 04/27/2014 1049   BILITOT 1.0 09/22/2009 1010       Lab Results  Component Value Date   WBC 14.9* 04/27/2014   HGB 10.8* 04/27/2014   HCT 33.9* 04/27/2014   MCV 81.4 04/27/2014   PLT 255 04/27/2014   NEUTROABS 13.2* 04/27/2014    ASSESSMENT & PLAN:  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 Patient started systemic adjuvant chemotherapy with dose dense Adriamycin Cytoxan. She started chemotherapy in 03/30/2014. Dose of chemotherapy was reduced after cycle 1 for severe neutropenia.  Today is cycle 3 day 1. Proceed with cycle 3 of treatment today. Return tomorrow for Neulasta.  Chemotoxicities: 1. Neutropenia grade 3: Required dose reduction for cycle 2 of chemotherapy 2. Alopecia  Return to clinic in one week to assess toxicities to chemo.    No orders of the defined types were placed in this encounter.   The patient has a good understanding of the overall plan. she agrees with it. She will call with any problems that may develop before her next visit here.   Mikey Bussing, NP 04/27/2014 3:37 PM

## 2014-04-27 NOTE — Telephone Encounter (Signed)
Mailed AVS & Cal for Dec.

## 2014-04-27 NOTE — Assessment & Plan Note (Addendum)
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 Patient started systemic adjuvant chemotherapy with dose dense Adriamycin Cytoxan. She started chemotherapy in 03/30/2014. Dose of chemotherapy was reduced after cycle 1 for severe neutropenia. Today is cycle 3 day 1. Proceed with cycle 3 of treatment today. Return tomorrow for Neulasta.  Chemotoxicities: 1. Neutropenia grade 3: Required dose reduction for cycle 2 of chemotherapy 2. Alopecia  Return to clinic in one week to assess toxicities to chemo.

## 2014-04-28 ENCOUNTER — Ambulatory Visit (HOSPITAL_BASED_OUTPATIENT_CLINIC_OR_DEPARTMENT_OTHER): Payer: Medicare Other

## 2014-04-28 DIAGNOSIS — D709 Neutropenia, unspecified: Secondary | ICD-10-CM

## 2014-04-28 DIAGNOSIS — C50411 Malignant neoplasm of upper-outer quadrant of right female breast: Secondary | ICD-10-CM

## 2014-04-28 MED ORDER — PEGFILGRASTIM INJECTION 6 MG/0.6ML ~~LOC~~
6.0000 mg | PREFILLED_SYRINGE | Freq: Once | SUBCUTANEOUS | Status: AC
  Administered 2014-04-28: 6 mg via SUBCUTANEOUS
  Filled 2014-04-28: qty 0.6

## 2014-04-28 NOTE — Patient Instructions (Signed)
Pegfilgrastim injection What is this medicine? PEGFILGRASTIM (peg fil GRA stim) is a long-acting granulocyte colony-stimulating factor that stimulates the growth of neutrophils, a type of white blood cell important in the body's fight against infection. It is used to reduce the incidence of fever and infection in patients with certain types of cancer who are receiving chemotherapy that affects the bone marrow. This medicine may be used for other purposes; ask your health care provider or pharmacist if you have questions. COMMON BRAND NAME(S): Neulasta What should I tell my health care provider before I take this medicine? They need to know if you have any of these conditions: -latex allergy -ongoing radiation therapy -sickle cell disease -skin reactions to acrylic adhesives (On-Body Injector only) -an unusual or allergic reaction to pegfilgrastim, filgrastim, other medicines, foods, dyes, or preservatives -pregnant or trying to get pregnant -breast-feeding How should I use this medicine? This medicine is for injection under the skin. If you get this medicine at home, you will be taught how to prepare and give the pre-filled syringe or how to use the On-body Injector. Refer to the patient Instructions for Use for detailed instructions. Use exactly as directed. Take your medicine at regular intervals. Do not take your medicine more often than directed. It is important that you put your used needles and syringes in a special sharps container. Do not put them in a trash can. If you do not have a sharps container, call your pharmacist or healthcare provider to get one. Talk to your pediatrician regarding the use of this medicine in children. Special care may be needed. Overdosage: If you think you have taken too much of this medicine contact a poison control center or emergency room at once. NOTE: This medicine is only for you. Do not share this medicine with others. What if I miss a dose? It is  important not to miss your dose. Call your doctor or health care professional if you miss your dose. If you miss a dose due to an On-body Injector failure or leakage, a new dose should be administered as soon as possible using a single prefilled syringe for manual use. What may interact with this medicine? Interactions have not been studied. Give your health care provider a list of all the medicines, herbs, non-prescription drugs, or dietary supplements you use. Also tell them if you smoke, drink alcohol, or use illegal drugs. Some items may interact with your medicine. This list may not describe all possible interactions. Give your health care provider a list of all the medicines, herbs, non-prescription drugs, or dietary supplements you use. Also tell them if you smoke, drink alcohol, or use illegal drugs. Some items may interact with your medicine. What should I watch for while using this medicine? You may need blood work done while you are taking this medicine. If you are going to need a MRI, CT scan, or other procedure, tell your doctor that you are using this medicine (On-Body Injector only). What side effects may I notice from receiving this medicine? Side effects that you should report to your doctor or health care professional as soon as possible: -allergic reactions like skin rash, itching or hives, swelling of the face, lips, or tongue -dizziness -fever -pain, redness, or irritation at site where injected -pinpoint red spots on the skin -shortness of breath or breathing problems -stomach or side pain, or pain at the shoulder -swelling -tiredness -trouble passing urine Side effects that usually do not require medical attention (report to your doctor   or health care professional if they continue or are bothersome): -bone pain -muscle pain This list may not describe all possible side effects. Call your doctor for medical advice about side effects. You may report side effects to FDA at  1-800-FDA-1088. Where should I keep my medicine? Keep out of the reach of children. Store pre-filled syringes in a refrigerator between 2 and 8 degrees C (36 and 46 degrees F). Do not freeze. Keep in carton to protect from light. Throw away this medicine if it is left out of the refrigerator for more than 48 hours. Throw away any unused medicine after the expiration date. NOTE: This sheet is a summary. It may not cover all possible information. If you have questions about this medicine, talk to your doctor, pharmacist, or health care provider.  2015, [employer]vier/Gold Standard. (2013-07-31 16:14:05)  

## 2014-05-01 ENCOUNTER — Encounter: Payer: Self-pay | Admitting: Hematology and Oncology

## 2014-05-04 ENCOUNTER — Other Ambulatory Visit: Payer: Self-pay | Admitting: *Deleted

## 2014-05-04 ENCOUNTER — Telehealth: Payer: Self-pay | Admitting: Hematology and Oncology

## 2014-05-04 NOTE — Telephone Encounter (Signed)
Pt confirmed labs/ov per 12/18 POF...Marland KitchenMarland KitchenMarland Kitchen KJ

## 2014-05-05 ENCOUNTER — Ambulatory Visit: Payer: Medicare Other | Admitting: Hematology and Oncology

## 2014-05-05 ENCOUNTER — Other Ambulatory Visit: Payer: Medicare Other

## 2014-05-05 NOTE — Assessment & Plan Note (Signed)
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  Treatment: Patient started systemic adjuvant chemotherapy with dose dense Adriamycin Cytoxan. She started chemotherapy in 03/30/2014. Dose of chemotherapy was reduced after cycle 1 for severe neutropenia. Today is cycle 3 day 8.   Chemotoxicities: 1. Neutropenia grade 3: Required dose reduction for cycle 2 of chemotherapy 2. Alopecia

## 2014-05-06 ENCOUNTER — Ambulatory Visit (HOSPITAL_BASED_OUTPATIENT_CLINIC_OR_DEPARTMENT_OTHER): Payer: Medicare Other

## 2014-05-06 ENCOUNTER — Telehealth: Payer: Self-pay | Admitting: *Deleted

## 2014-05-06 ENCOUNTER — Other Ambulatory Visit: Payer: Self-pay | Admitting: *Deleted

## 2014-05-06 ENCOUNTER — Ambulatory Visit (HOSPITAL_BASED_OUTPATIENT_CLINIC_OR_DEPARTMENT_OTHER): Payer: Medicare Other | Admitting: Lab

## 2014-05-06 ENCOUNTER — Telehealth: Payer: Self-pay | Admitting: Hematology and Oncology

## 2014-05-06 ENCOUNTER — Ambulatory Visit (HOSPITAL_BASED_OUTPATIENT_CLINIC_OR_DEPARTMENT_OTHER): Payer: Medicare Other | Admitting: Hematology and Oncology

## 2014-05-06 VITALS — BP 78/50 | HR 88

## 2014-05-06 VITALS — BP 84/46 | HR 88 | Temp 98.1°F | Resp 18 | Ht 63.0 in | Wt 192.9 lb

## 2014-05-06 DIAGNOSIS — C50411 Malignant neoplasm of upper-outer quadrant of right female breast: Secondary | ICD-10-CM

## 2014-05-06 DIAGNOSIS — D708 Other neutropenia: Secondary | ICD-10-CM | POA: Diagnosis not present

## 2014-05-06 DIAGNOSIS — E860 Dehydration: Secondary | ICD-10-CM

## 2014-05-06 LAB — CBC WITH DIFFERENTIAL/PLATELET
BASO%: 1.9 % (ref 0.0–2.0)
BASOS ABS: 0 10*3/uL (ref 0.0–0.1)
EOS%: 0 % (ref 0.0–7.0)
Eosinophils Absolute: 0 10*3/uL (ref 0.0–0.5)
HEMATOCRIT: 28 % — AB (ref 34.8–46.6)
HEMOGLOBIN: 9.5 g/dL — AB (ref 11.6–15.9)
LYMPH%: 17.7 % (ref 14.0–49.7)
MCH: 26.4 pg (ref 25.1–34.0)
MCHC: 33.9 g/dL (ref 31.5–36.0)
MCV: 77.8 fL — ABNORMAL LOW (ref 79.5–101.0)
MONO#: 0.3 10*3/uL (ref 0.1–0.9)
MONO%: 18.4 % — ABNORMAL HIGH (ref 0.0–14.0)
NEUT%: 62 % (ref 38.4–76.8)
NEUTROS ABS: 1 10*3/uL — AB (ref 1.5–6.5)
Platelets: 89 10*3/uL — ABNORMAL LOW (ref 145–400)
RBC: 3.6 10*6/uL — ABNORMAL LOW (ref 3.70–5.45)
RDW: 14.2 % (ref 11.2–14.5)
WBC: 1.6 10*3/uL — AB (ref 3.9–10.3)
lymph#: 0.3 10*3/uL — ABNORMAL LOW (ref 0.9–3.3)

## 2014-05-06 LAB — COMPREHENSIVE METABOLIC PANEL (CC13)
ALBUMIN: 2.7 g/dL — AB (ref 3.5–5.0)
ALT: 25 U/L (ref 0–55)
ANION GAP: 13 meq/L — AB (ref 3–11)
AST: 20 U/L (ref 5–34)
Alkaline Phosphatase: 82 U/L (ref 40–150)
BUN: 22.1 mg/dL (ref 7.0–26.0)
CHLORIDE: 97 meq/L — AB (ref 98–109)
CO2: 26 meq/L (ref 22–29)
Calcium: 9.3 mg/dL (ref 8.4–10.4)
Creatinine: 1.6 mg/dL — ABNORMAL HIGH (ref 0.6–1.1)
EGFR: 33 mL/min/{1.73_m2} — ABNORMAL LOW (ref >90)
Glucose: 130 mg/dl (ref 70–140)
POTASSIUM: 3.3 meq/L — AB (ref 3.5–5.1)
SODIUM: 136 meq/L (ref 136–145)
TOTAL PROTEIN: 6.4 g/dL (ref 6.4–8.3)
Total Bilirubin: 0.75 mg/dL (ref 0.20–1.20)

## 2014-05-06 MED ORDER — HEPARIN SOD (PORK) LOCK FLUSH 100 UNIT/ML IV SOLN
500.0000 [IU] | Freq: Once | INTRAVENOUS | Status: AC
  Administered 2014-05-06: 500 [IU] via INTRAVENOUS
  Filled 2014-05-06: qty 5

## 2014-05-06 MED ORDER — ONDANSETRON 8 MG/50ML IVPB (CHCC)
8.0000 mg | Freq: Once | INTRAVENOUS | Status: DC
  Administered 2014-05-06: 8 mg via INTRAVENOUS

## 2014-05-06 MED ORDER — SODIUM CHLORIDE 0.9 % IV SOLN
Freq: Once | INTRAVENOUS | Status: DC
  Administered 2014-05-06: 11:00:00 via INTRAVENOUS
  Filled 2014-05-06: qty 1000

## 2014-05-06 MED ORDER — ONDANSETRON 8 MG/NS 50 ML IVPB
INTRAVENOUS | Status: AC
  Filled 2014-05-06: qty 8

## 2014-05-06 MED ORDER — SODIUM CHLORIDE 0.9 % IJ SOLN
10.0000 mL | INTRAMUSCULAR | Status: DC | PRN
  Administered 2014-05-06: 10 mL via INTRAVENOUS
  Filled 2014-05-06: qty 10

## 2014-05-06 NOTE — Telephone Encounter (Signed)
Per 12/23 POF labs/ov added for today 12/23 and sent msg to add fluids also for today..... KJ

## 2014-05-06 NOTE — Patient Instructions (Addendum)
Dehydration, Adult PUSH PO FLUIDS HOLD ALL BP MEDS     Dehydration is when you lose more fluids from the body than you take in. Vital organs like the kidneys, brain, and heart cannot function without a proper amount of fluids and salt. Any loss of fluids from the body can cause dehydration.  CAUSES   Vomiting.  Diarrhea.  Excessive sweating.  Excessive urine output.  Fever. SYMPTOMS  Mild dehydration  Thirst.  Dry lips.  Slightly dry mouth. Moderate dehydration  Very dry mouth.  Sunken eyes.  Skin does not bounce back quickly when lightly pinched and released.  Dark urine and decreased urine production.  Decreased tear production.  Headache. Severe dehydration  Very dry mouth.  Extreme thirst.  Rapid, weak pulse (more than 100 beats per minute at rest).  Cold hands and feet.  Not able to sweat in spite of heat and temperature.  Rapid breathing.  Blue lips.  Confusion and lethargy.  Difficulty being awakened.  Minimal urine production.  No tears. DIAGNOSIS  Your caregiver will diagnose dehydration based on your symptoms and your exam. Blood and urine tests will help confirm the diagnosis. The diagnostic evaluation should also identify the cause of dehydration. TREATMENT  Treatment of mild or moderate dehydration can often be done at home by increasing the amount of fluids that you drink. It is best to drink small amounts of fluid more often. Drinking too much at one time can make vomiting worse. Refer to the home care instructions below. Severe dehydration needs to be treated at the hospital where you will probably be given intravenous (IV) fluids that contain water and electrolytes. HOME CARE INSTRUCTIONS   Ask your caregiver about specific rehydration instructions.  Drink enough fluids to keep your urine clear or pale yellow.  Drink small amounts frequently if you have nausea and vomiting.  Eat as you normally do.  Avoid:  Foods or  drinks high in sugar.  Carbonated drinks.  Juice.  Extremely hot or cold fluids.  Drinks with caffeine.  Fatty, greasy foods.  Alcohol.  Tobacco.  Overeating.  Gelatin desserts.  Wash your hands well to avoid spreading bacteria and viruses.  Only take over-the-counter or prescription medicines for pain, discomfort, or fever as directed by your caregiver.  Ask your caregiver if you should continue all prescribed and over-the-counter medicines.  Keep all follow-up appointments with your caregiver. SEEK MEDICAL CARE IF:  You have abdominal pain and it increases or stays in one area (localizes).  You have a rash, stiff neck, or severe headache.  You are irritable, sleepy, or difficult to awaken.  You are weak, dizzy, or extremely thirsty. SEEK IMMEDIATE MEDICAL CARE IF:   You are unable to keep fluids down or you get worse despite treatment.  You have frequent episodes of vomiting or diarrhea.  You have blood or green matter (bile) in your vomit.  You have blood in your stool or your stool looks black and tarry.  You have not urinated in 6 to 8 hours, or you have only urinated a small amount of very dark urine.  You have a fever.  You faint. MAKE SURE YOU:   Understand these instructions.  Will watch your condition.  Will get help right away if you are not doing well or get worse. Document Released: 05/01/2005 Document Revised: 07/24/2011 Document Reviewed: 12/19/2010 [facility] Patient Information 2015 [place], [place]. This information is not intended to replace advice given to you by your health care provider. Make  sure you discuss any questions you have with your health care provider.  

## 2014-05-06 NOTE — Assessment & Plan Note (Signed)
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 Patient started systemic adjuvant chemotherapy with dose dense Adriamycin Cytoxan. She started chemotherapy in 03/30/2014. Dose of chemotherapy was reduced after cycle 1 for severe neutropenia. Today is cycle 3 day 9.   Chemotoxicities: 1. Neutropenia grade 3: Required dose reduction for cycle 2 of chemotherapy 2. Alopecia 3.  Chemotherapy-induced nausea : Patient was not taking antiemetics as prescribed. Instruct her to take Zofran and Compazine for nausea. 4.  Chemotherapy-induced diarrhea: I  Recommended that she take Imodium. I provided her with instructions regarding dietary management. I recommended giving her IV fluids with potassium and Zofran today and tomorrow for 1 L of fluids. 5.  Chemotherapy-induced severe fatigue: patient is completely wiped out. 6.  Gas pains in the belly : a recommended that she take fluids and eat food so that she would not have those pains and discomfort.   I reviewed her blood work which showed that she had an Hyde of 1000. She is not febrile. I hope she would recover her energy levels within the next few days. I plan on reducing the dosage of chemotherapy for the next treatment.

## 2014-05-06 NOTE — Progress Notes (Signed)
Patient Care Team: Hulan Fess, MD as PCP - General (Family Medicine)  DIAGNOSIS: Breast cancer of upper-outer quadrant of right female breast   Staging form: Breast, AJCC 7th Edition     Clinical: Stage IA (T1c, N0, cM0) - Signed by Thea Silversmith, MD on 02/18/2014     Pathologic: Stage IIA (T2, N0, cM0) - Signed by Rulon Eisenmenger, MD on 03/12/2014   SUMMARY OF ONCOLOGIC HISTORY:   Breast cancer of upper-outer quadrant of right female breast   02/27/2014 Surgery Right breast lumpectomy: Invasive ductal carcinoma grade 3, 2.5 cm, DCIS intermediate grade, margins negative, 2 SLN negative, ER/PR HER-2 negative   03/30/2014 -  Chemotherapy Adjuvant chemotherapy with dose dense Adriamycin Cytoxan x4 followed by Abraxane weekly x12    CHIEF COMPLIANT: diarrhea, nausea, abdominal pain, not eating food or taking any fluids for the past 2 days  INTERVAL HISTORY: Cheyenne Riggs is a 68 year old lady with above-mentioned history of right breast cancer treated with lumpectomy and is currently on adjuvant chemotherapy. She received cycle 3 of chemotherapy about a week ago. She was doing okay for the first 3 days and then started to have nausea fatigue diarrhea abdominal pain. She did not come for the near count checked because she was not feeling too well. She came in today urgently. She has not been eating or drinking any liquids. She feels very weak. On presentation her blood pressure was 89/60. She has not been taking her antiemetics. She feels nauseated and hence does not want to eat or drink. She is extremely frail and weak appearing. She denies any fevers or chills. Denies any sore throat or mouth sores.  REVIEW OF SYSTEMS:   Constitutional: Denies fevers, chills or abnormal weight loss Eyes: Denies blurriness of vision Ears, nose, mouth, throat, and face: lack of taste and appetite Respiratory: Denies cough, dyspnea or wheezes Cardiovascular: Denies palpitation, chest discomfort or lower extremity  swelling Gastrointestinal:  Diarrhea and abdominal pain Skin: Denies abnormal skin rashes Lymphatics: Denies new lymphadenopathy or easy bruising Neurological:Denies numbness, tingling or new weaknesses Behavioral/Psych: Mood is stable, no new changes   All other systems were reviewed with the patient and are negative.  I have reviewed the past medical history, past surgical history, social history and family history with the patient and they are unchanged from previous note.  ALLERGIES:  is allergic to ciprofloxacin; demerol; lidocaine; other; septra; codeine; and novocain.  MEDICATIONS:  Current Outpatient Prescriptions  Medication Sig Dispense Refill  . acetaminophen (TYLENOL) 325 MG tablet Take 650 mg by mouth every 6 (six) hours as needed.    . ALPRAZolam (XANAX) 0.25 MG tablet Take 0.25 mg by mouth 2 (two) times daily as needed for anxiety.    Marland Kitchen aspirin 81 MG tablet Take 81 mg by mouth daily.    . cholecalciferol (VITAMIN D) 1000 UNITS tablet Take 2,000 Units by mouth daily.    . cyclobenzaprine (FLEXERIL) 5 MG tablet Take 5 mg by mouth 3 (three) times daily as needed for muscle spasms.    Marland Kitchen dexamethasone (DECADRON) 4 MG tablet Take 2 tablets by mouth once a day on the day after chemotherapy and then take 2 tablets two times a day for 2 days. Take with food. 30 tablet 1  . FLUoxetine (PROZAC) 10 MG capsule Take 10 mg by mouth daily.    Marland Kitchen lidocaine-prilocaine (EMLA) cream Apply 1 application topically as needed. 30 g 6  . LORazepam (ATIVAN) 0.5 MG tablet Take 1 tablet (0.5 mg  total) by mouth every 6 (six) hours as needed for anxiety (nausea and vomiting). 30 tablet 0  . ondansetron (ZOFRAN) 8 MG tablet Take 1 tablet (8 mg total) by mouth 2 (two) times daily as needed. Start on the third day after chemotherapy. 30 tablet 1  . oxyCODONE-acetaminophen (PERCOCET/ROXICET) 5-325 MG per tablet   0  . Probiotic Product (PROBIOTIC DAILY PO) Take by mouth daily.    . prochlorperazine  (COMPAZINE) 10 MG tablet Take 1 tablet (10 mg total) by mouth every 6 (six) hours as needed (Nausea or vomiting). (Patient not taking: Reported on 04/27/2014) 30 tablet 1  . ranitidine (ZANTAC) 150 MG tablet Take 150 mg by mouth as needed.     . rosuvastatin (CRESTOR) 20 MG tablet Take 20 mg by mouth daily.    Marland Kitchen UNABLE TO FIND Apply 1 each topically as needed. 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  ICD-10 C50.411 1 each 0  . valsartan (DIOVAN) 320 MG tablet Take 320 mg by mouth daily.     No current facility-administered medications for this visit.   Facility-Administered Medications Ordered in Other Visits  Medication Dose Route Frequency Provider Last Rate Last Dose  . ondansetron (ZOFRAN) IVPB 8 mg  8 mg Intravenous Once Rulon Eisenmenger, MD      . sodium chloride 0.9 % 1,000 mL with potassium chloride 10 mEq infusion   Intravenous Once Rulon Eisenmenger, MD        PHYSICAL EXAMINATION: ECOG PERFORMANCE STATUS: 3 - Symptomatic, >50% confined to bed  Filed Vitals:   05/06/14 1037  BP: 84/46  Pulse: 88  Temp: 98.1 F (36.7 C)  Resp: 18   Filed Weights   05/06/14 1037  Weight: 192 lb 14.4 oz (87.499 kg)    GENERAL:alert, no distress and comfortable SKIN: skin color, texture, turgor are normal, no rashes or significant lesions EYES: normal, Conjunctiva are pink and non-injected, sclera clear OROPHARYNX:dry oral mucosa NECK: supple, thyroid normal size, non-tender, without nodularity LUNGS: clear to auscultation and percussion with normal breathing effort HEART: regular rate & rhythm and no murmurs and no lower extremity edema ABDOMEN:abdomen soft, non-tender and normal bowel sounds Musculoskeletal:no cyanosis of digits and no clubbing  NEURO: alert & oriented x 3 with fluent speech, no focal motor/sensory deficits  LABORATORY DATA:  I have reviewed the data as listed   Chemistry      Component Value Date/Time   NA 136  05/06/2014 1016   NA 140 02/25/2014 1030   K 3.3* 05/06/2014 1016   K 3.9 02/25/2014 1030   CL 100 02/25/2014 1030   CO2 26 05/06/2014 1016   CO2 24 02/25/2014 1030   BUN 22.1 05/06/2014 1016   BUN 17 02/25/2014 1030   CREATININE 1.6* 05/06/2014 1016   CREATININE 1.19* 02/25/2014 1030      Component Value Date/Time   CALCIUM 9.3 05/06/2014 1016   CALCIUM 9.5 02/25/2014 1030   ALKPHOS 82 05/06/2014 1016   ALKPHOS 131* 09/22/2009 1010   AST 20 05/06/2014 1016   AST 156* 09/22/2009 1010   ALT 25 05/06/2014 1016   ALT 110* 09/22/2009 1010   BILITOT 0.75 05/06/2014 1016   BILITOT 1.0 09/22/2009 1010       Lab Results  Component Value Date   WBC 1.6* 05/06/2014   HGB 9.5* 05/06/2014   HCT 28.0* 05/06/2014   MCV 77.8* 05/06/2014   PLT 89* 05/06/2014   NEUTROABS 1.0* 05/06/2014  ASSESSMENT & PLAN:  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 Patient started systemic adjuvant chemotherapy with dose dense Adriamycin Cytoxan. She started chemotherapy in 03/30/2014. Dose of chemotherapy was reduced after cycle 1 for severe neutropenia. Today is cycle 3 day 9.   Chemotoxicities: 1. Neutropenia grade 3: Required dose reduction for cycle 2 of chemotherapy 2. Alopecia 3.  Chemotherapy-induced nausea : Patient was not taking antiemetics as prescribed. Instruct her to take Zofran and Compazine for nausea. 4.  Chemotherapy-induced diarrhea: I  Recommended that she take Imodium. I provided her with instructions regarding dietary management. I recommended giving her IV fluids with potassium and Zofran today and tomorrow for 1 L of fluids. 5.  Chemotherapy-induced severe fatigue: patient is completely wiped out. 6.  Gas pains in the belly : a recommended that she take fluids and eat food so that she would not have those pains and  discomfort. 7. Chemotherapy-induced anemia: Being observed   I reviewed her blood work which showed that she had an Ione of 1000. She is not febrile. I hope she would recover her energy levels within the next few days. I plan on reducing the dosage of chemotherapy for the next treatment.    No orders of the defined types were placed in this encounter.   The patient has a good understanding of the overall plan. she agrees with it. She will call with any problems that may develop before her next visit here.   Rulon Eisenmenger, MD 05/06/2014 11:15 AM

## 2014-05-06 NOTE — Telephone Encounter (Signed)
Patient called stating that she has had diarrhea (2-3 times a day) for the last 3 days. Has not been able to keep anything down, some vomiting, taking Ativan. States that she feels very weak. Also stated that she had a fever 3 days ago but it has since broken. POF sent to scheduler for patient to have labs and fluids and be seen today.

## 2014-05-06 NOTE — Progress Notes (Signed)
Spoke with MD regarding unchanged BP after IV fluids, but symptomatically she reports feeling better. OK to D/C home-push fluids and hold BP meds.

## 2014-05-07 ENCOUNTER — Ambulatory Visit (HOSPITAL_BASED_OUTPATIENT_CLINIC_OR_DEPARTMENT_OTHER): Payer: Medicare Other

## 2014-05-07 VITALS — BP 117/48 | HR 99 | Temp 97.9°F | Resp 18

## 2014-05-07 DIAGNOSIS — R197 Diarrhea, unspecified: Secondary | ICD-10-CM

## 2014-05-07 DIAGNOSIS — C50411 Malignant neoplasm of upper-outer quadrant of right female breast: Secondary | ICD-10-CM | POA: Diagnosis not present

## 2014-05-07 MED ORDER — ONDANSETRON 8 MG/NS 50 ML IVPB
INTRAVENOUS | Status: AC
  Filled 2014-05-07: qty 8

## 2014-05-07 MED ORDER — ONDANSETRON 8 MG/50ML IVPB (CHCC)
8.0000 mg | Freq: Once | INTRAVENOUS | Status: AC
  Administered 2014-05-07: 8 mg via INTRAVENOUS

## 2014-05-07 MED ORDER — SODIUM CHLORIDE 0.9 % IV SOLN
INTRAVENOUS | Status: DC
  Administered 2014-05-07: 13:00:00 via INTRAVENOUS
  Filled 2014-05-07: qty 1000

## 2014-05-07 NOTE — Patient Instructions (Signed)
Dehydration, Adult Dehydration is when you lose more fluids from the body than you take in. Vital organs like the kidneys, brain, and heart cannot function without a proper amount of fluids and salt. Any loss of fluids from the body can cause dehydration.  CAUSES   Vomiting.  Diarrhea.  Excessive sweating.  Excessive urine output.  Fever. SYMPTOMS  Mild dehydration  Thirst.  Dry lips.  Slightly dry mouth. Moderate dehydration  Very dry mouth.  Sunken eyes.  Skin does not bounce back quickly when lightly pinched and released.  Dark urine and decreased urine production.  Decreased tear production.  Headache. Severe dehydration  Very dry mouth.  Extreme thirst.  Rapid, weak pulse (more than 100 beats per minute at rest).  Cold hands and feet.  Not able to sweat in spite of heat and temperature.  Rapid breathing.  Blue lips.  Confusion and lethargy.  Difficulty being awakened.  Minimal urine production.  No tears. DIAGNOSIS  Your caregiver will diagnose dehydration based on your symptoms and your exam. Blood and urine tests will help confirm the diagnosis. The diagnostic evaluation should also identify the cause of dehydration. TREATMENT  Treatment of mild or moderate dehydration can often be done at home by increasing the amount of fluids that you drink. It is best to drink small amounts of fluid more often. Drinking too much at one time can make vomiting worse. Refer to the home care instructions below. Severe dehydration needs to be treated at the hospital where you will probably be given intravenous (IV) fluids that contain water and electrolytes. HOME CARE INSTRUCTIONS   Ask your caregiver about specific rehydration instructions.  Drink enough fluids to keep your urine clear or pale yellow.  Drink small amounts frequently if you have nausea and vomiting.  Eat as you normally do.  Avoid:  Foods or drinks high in sugar.  Carbonated  drinks.  Juice.  Extremely hot or cold fluids.  Drinks with caffeine.  Fatty, greasy foods.  Alcohol.  Tobacco.  Overeating.  Gelatin desserts.  Wash your hands well to avoid spreading bacteria and viruses.  Only take over-the-counter or prescription medicines for pain, discomfort, or fever as directed by your caregiver.  Ask your caregiver if you should continue all prescribed and over-the-counter medicines.  Keep all follow-up appointments with your caregiver. SEEK MEDICAL CARE IF:  You have abdominal pain and it increases or stays in one area (localizes).  You have a rash, stiff neck, or severe headache.  You are irritable, sleepy, or difficult to awaken.  You are weak, dizzy, or extremely thirsty. SEEK IMMEDIATE MEDICAL CARE IF:   You are unable to keep fluids down or you get worse despite treatment.  You have frequent episodes of vomiting or diarrhea.  You have blood or green matter (bile) in your vomit.  You have blood in your stool or your stool looks black and tarry.  You have not urinated in 6 to 8 hours, or you have only urinated a small amount of very dark urine.  You have a fever.  You faint. MAKE SURE YOU:   Understand these instructions.  Will watch your condition.  Will get help right away if you are not doing well or get worse. Document Released: 05/01/2005 Document Revised: 07/24/2011 Document Reviewed: 12/19/2010 Exit[employer] Patient Information 2015 [employer]. This information is not intended to replace advice given to you by your health care provider. Make sure you discuss any questions you have with your health care   provider.  

## 2014-05-11 ENCOUNTER — Telehealth: Payer: Self-pay | Admitting: Hematology and Oncology

## 2014-05-11 ENCOUNTER — Telehealth: Payer: Self-pay | Admitting: Nurse Practitioner

## 2014-05-11 ENCOUNTER — Encounter: Payer: Self-pay | Admitting: Nurse Practitioner

## 2014-05-11 ENCOUNTER — Ambulatory Visit (HOSPITAL_BASED_OUTPATIENT_CLINIC_OR_DEPARTMENT_OTHER): Payer: Medicare Other

## 2014-05-11 ENCOUNTER — Other Ambulatory Visit: Payer: Self-pay | Admitting: Hematology and Oncology

## 2014-05-11 ENCOUNTER — Other Ambulatory Visit (HOSPITAL_BASED_OUTPATIENT_CLINIC_OR_DEPARTMENT_OTHER): Payer: Medicare Other

## 2014-05-11 ENCOUNTER — Ambulatory Visit (HOSPITAL_BASED_OUTPATIENT_CLINIC_OR_DEPARTMENT_OTHER): Payer: Medicare Other | Admitting: Nurse Practitioner

## 2014-05-11 VITALS — BP 135/67 | HR 102 | Temp 98.6°F | Resp 18 | Ht 63.0 in | Wt 195.6 lb

## 2014-05-11 DIAGNOSIS — D649 Anemia, unspecified: Secondary | ICD-10-CM | POA: Diagnosis not present

## 2014-05-11 DIAGNOSIS — Z5111 Encounter for antineoplastic chemotherapy: Secondary | ICD-10-CM

## 2014-05-11 DIAGNOSIS — C50411 Malignant neoplasm of upper-outer quadrant of right female breast: Secondary | ICD-10-CM

## 2014-05-11 DIAGNOSIS — R110 Nausea: Secondary | ICD-10-CM | POA: Diagnosis not present

## 2014-05-11 LAB — CBC WITH DIFFERENTIAL/PLATELET
BASO%: 0.2 % (ref 0.0–2.0)
Basophils Absolute: 0 10*3/uL (ref 0.0–0.1)
EOS%: 0.1 % (ref 0.0–7.0)
Eosinophils Absolute: 0 10*3/uL (ref 0.0–0.5)
HCT: 27.9 % — ABNORMAL LOW (ref 34.8–46.6)
HGB: 9.3 g/dL — ABNORMAL LOW (ref 11.6–15.9)
LYMPH#: 0.6 10*3/uL — AB (ref 0.9–3.3)
LYMPH%: 6.1 % — ABNORMAL LOW (ref 14.0–49.7)
MCH: 26.1 pg (ref 25.1–34.0)
MCHC: 33.3 g/dL (ref 31.5–36.0)
MCV: 78.4 fL — ABNORMAL LOW (ref 79.5–101.0)
MONO#: 0.9 10*3/uL (ref 0.1–0.9)
MONO%: 8.2 % (ref 0.0–14.0)
NEUT#: 9 10*3/uL — ABNORMAL HIGH (ref 1.5–6.5)
NEUT%: 85.4 % — ABNORMAL HIGH (ref 38.4–76.8)
PLATELETS: 273 10*3/uL (ref 145–400)
RBC: 3.56 10*6/uL — ABNORMAL LOW (ref 3.70–5.45)
RDW: 15.1 % — AB (ref 11.2–14.5)
WBC: 10.6 10*3/uL — AB (ref 3.9–10.3)

## 2014-05-11 LAB — COMPREHENSIVE METABOLIC PANEL (CC13)
ALBUMIN: 2.6 g/dL — AB (ref 3.5–5.0)
ALK PHOS: 95 U/L (ref 40–150)
ALT: 33 U/L (ref 0–55)
AST: 23 U/L (ref 5–34)
Anion Gap: 11 mEq/L (ref 3–11)
BILIRUBIN TOTAL: 0.34 mg/dL (ref 0.20–1.20)
BUN: 6.1 mg/dL — ABNORMAL LOW (ref 7.0–26.0)
CO2: 26 mEq/L (ref 22–29)
CREATININE: 1 mg/dL (ref 0.6–1.1)
Calcium: 8.9 mg/dL (ref 8.4–10.4)
Chloride: 103 mEq/L (ref 98–109)
EGFR: 56 mL/min/{1.73_m2} — AB (ref >90)
Glucose: 115 mg/dl (ref 70–140)
Potassium: 3.3 mEq/L — ABNORMAL LOW (ref 3.5–5.1)
SODIUM: 140 meq/L (ref 136–145)
Total Protein: 6.3 g/dL — ABNORMAL LOW (ref 6.4–8.3)

## 2014-05-11 MED ORDER — SODIUM CHLORIDE 0.9 % IV SOLN
150.0000 mg | Freq: Once | INTRAVENOUS | Status: AC
  Administered 2014-05-11: 150 mg via INTRAVENOUS
  Filled 2014-05-11: qty 5

## 2014-05-11 MED ORDER — SODIUM CHLORIDE 0.9 % IJ SOLN
10.0000 mL | INTRAMUSCULAR | Status: DC | PRN
  Administered 2014-05-11: 10 mL
  Filled 2014-05-11: qty 10

## 2014-05-11 MED ORDER — DOXORUBICIN HCL CHEMO IV INJECTION 2 MG/ML
45.0000 mg/m2 | Freq: Once | INTRAVENOUS | Status: AC
  Administered 2014-05-11: 92 mg via INTRAVENOUS
  Filled 2014-05-11: qty 46

## 2014-05-11 MED ORDER — DEXAMETHASONE SODIUM PHOSPHATE 20 MG/5ML IJ SOLN
INTRAMUSCULAR | Status: AC
  Filled 2014-05-11: qty 5

## 2014-05-11 MED ORDER — PALONOSETRON HCL INJECTION 0.25 MG/5ML
0.2500 mg | Freq: Once | INTRAVENOUS | Status: AC
  Administered 2014-05-11: 0.25 mg via INTRAVENOUS

## 2014-05-11 MED ORDER — HEPARIN SOD (PORK) LOCK FLUSH 100 UNIT/ML IV SOLN
500.0000 [IU] | Freq: Once | INTRAVENOUS | Status: AC | PRN
  Administered 2014-05-11: 500 [IU]
  Filled 2014-05-11: qty 5

## 2014-05-11 MED ORDER — DEXAMETHASONE SODIUM PHOSPHATE 20 MG/5ML IJ SOLN
12.0000 mg | Freq: Once | INTRAMUSCULAR | Status: AC
  Administered 2014-05-11: 12 mg via INTRAVENOUS

## 2014-05-11 MED ORDER — PALONOSETRON HCL INJECTION 0.25 MG/5ML
INTRAVENOUS | Status: AC
  Filled 2014-05-11: qty 5

## 2014-05-11 MED ORDER — SODIUM CHLORIDE 0.9 % IV SOLN
Freq: Once | INTRAVENOUS | Status: AC
  Administered 2014-05-11: 16:00:00 via INTRAVENOUS

## 2014-05-11 MED ORDER — SODIUM CHLORIDE 0.9 % IV SOLN
450.0000 mg/m2 | Freq: Once | INTRAVENOUS | Status: AC
  Administered 2014-05-11: 920 mg via INTRAVENOUS
  Filled 2014-05-11: qty 46

## 2014-05-11 NOTE — Patient Instructions (Signed)
[facility] Discharge Instructions for Patients Receiving Chemotherapy  Today you received the following chemotherapy agents: Adriamycin and cytoxan.  To help prevent nausea and vomiting after your treatment, we encourage you to take your nausea medication: as directed: Compazine 10 mg every 6 hours as needed. Zofran 8 mg every 12 hours as needed.   If you develop nausea and vomiting that is not controlled by your nausea medication, call the clinic.   BELOW ARE SYMPTOMS THAT SHOULD BE REPORTED IMMEDIATELY:  *FEVER GREATER THAN 100.5 F  *CHILLS WITH OR WITHOUT FEVER  NAUSEA AND VOMITING THAT IS NOT CONTROLLED WITH YOUR NAUSEA MEDICATION  *UNUSUAL SHORTNESS OF BREATH  *UNUSUAL BRUISING OR BLEEDING  TENDERNESS IN MOUTH AND THROAT WITH OR WITHOUT PRESENCE OF ULCERS  *URINARY PROBLEMS  *BOWEL PROBLEMS  UNUSUAL RASH Items with * indicate a potential emergency and should be followed up as soon as possible.  Feel free to call the clinic you have any questions or concerns. The clinic phone number is [phone].

## 2014-05-11 NOTE — Telephone Encounter (Signed)
, °

## 2014-05-11 NOTE — Progress Notes (Signed)
Patient Care Team: Hulan Fess, MD as PCP - General (Family Medicine)  DIAGNOSIS: Breast cancer of upper-outer quadrant of right female breast   Staging form: Breast, AJCC 7th Edition     Clinical: Stage IA (T1c, N0, cM0) - Signed by Thea Silversmith, MD on 02/18/2014     Pathologic: Stage IIA (T2, N0, cM0) - Signed by Rulon Eisenmenger, MD on 03/12/2014   SUMMARY OF ONCOLOGIC HISTORY:   Breast cancer of upper-outer quadrant of right female breast   02/27/2014 Surgery Right breast lumpectomy: Invasive ductal carcinoma grade 3, 2.5 cm, DCIS intermediate grade, margins negative, 2 SLN negative, ER/PR HER-2 negative   03/30/2014 -  Chemotherapy Adjuvant chemotherapy with dose dense Adriamycin Cytoxan x4 followed by Abraxane weekly x12    CHIEF COMPLIANT: cycle 4 of adriamycin and cytoxan.   INTERVAL HISTORY: Cheyenne Riggs is a 68 year old lady with above-mentioned history of right breast cancer treated with lumpectomy and is currently on adjuvant chemotherapy. She is due for her 4th and final cycle of adriamycin and cytoxan today. She was seen urgently with complaints of nausea, diarrhea, and dehydration. She was given IV fluids with potassium as well as zofran and feels much improved. She denies fevers and chills. Her nausea has subsided. She had one episode of diarrhea managed with imodium. Her appetite is better and she is taking in more fluids. She denies mouth sores or rashes, but she has an abscess on her lower left row of teeth that will be looked at by her dentist tomorrow. Her fatigue is manageable. She denies shortness of breath, chest pain, cough, or palptiations.   REVIEW OF SYSTEMS:   All other systems were reviewed with the patient and are negative.  I have reviewed the past medical history, past surgical history, social history and family history with the patient and they are unchanged from previous note.  ALLERGIES:  is allergic to ciprofloxacin; demerol; lidocaine; other; septra;  codeine; and novocain.  MEDICATIONS:  Current Outpatient Prescriptions  Medication Sig Dispense Refill  . ALPRAZolam (XANAX) 0.25 MG tablet Take 0.25 mg by mouth 2 (two) times daily as needed for anxiety.    . cholecalciferol (VITAMIN D) 1000 UNITS tablet Take 2,000 Units by mouth daily.    Marland Kitchen dexamethasone (DECADRON) 4 MG tablet Take 2 tablets by mouth once a day on the day after chemotherapy and then take 2 tablets two times a day for 2 days. Take with food. 30 tablet 1  . FLUoxetine (PROZAC) 10 MG capsule Take 10 mg by mouth daily.    Marland Kitchen lidocaine-prilocaine (EMLA) cream Apply 1 application topically as needed. 30 g 6  . LORazepam (ATIVAN) 0.5 MG tablet Take 1 tablet (0.5 mg total) by mouth every 6 (six) hours as needed for anxiety (nausea and vomiting). 30 tablet 0  . ondansetron (ZOFRAN) 8 MG tablet Take 1 tablet (8 mg total) by mouth 2 (two) times daily as needed. Start on the third day after chemotherapy. 30 tablet 1  . UNABLE TO FIND Apply 1 each topically as needed. 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  ICD-10 C50.411 1 each 0  . acetaminophen (TYLENOL) 325 MG tablet Take 650 mg by mouth every 6 (six) hours as needed.    Marland Kitchen aspirin 81 MG tablet Take 81 mg by mouth daily.    . cyclobenzaprine (FLEXERIL) 5 MG tablet Take 5 mg by mouth 3 (three) times daily as needed for muscle spasms.    Marland Kitchen  oxyCODONE-acetaminophen (PERCOCET/ROXICET) 5-325 MG per tablet   0  . Probiotic Product (PROBIOTIC DAILY PO) Take by mouth daily.    . prochlorperazine (COMPAZINE) 10 MG tablet Take 1 tablet (10 mg total) by mouth every 6 (six) hours as needed (Nausea or vomiting). (Patient not taking: Reported on 04/27/2014) 30 tablet 1  . ranitidine (ZANTAC) 150 MG tablet Take 150 mg by mouth as needed.     . rosuvastatin (CRESTOR) 20 MG tablet Take 20 mg by mouth daily.    . valsartan (DIOVAN) 320 MG tablet Take 320 mg by mouth daily.     No current  facility-administered medications for this visit.    PHYSICAL EXAMINATION: ECOG PERFORMANCE STATUS: 3 - Symptomatic, >50% confined to bed  Filed Vitals:   05/11/14 1359  BP: 135/67  Pulse: 102  Temp: 98.6 F (37 C)  Resp: 18   Filed Weights   05/11/14 1359  Weight: 195 lb 9.6 oz (88.724 kg)   Skin: warm, dry  HEENT: sclerae anicteric, conjunctivae pink, oropharynx clear. No thrush or mucositis.  Lymph Nodes: No cervical or supraclavicular lymphadenopathy  Lungs: clear to auscultation bilaterally, no rales, wheezes, or rhonci  Heart: regular rate and rhythm  Abdomen: round, soft, non tender, positive bowel sounds  Musculoskeletal: No focal spinal tenderness, no peripheral edema  Neuro: non focal, well oriented, positive affect  Breasts: deferred  LABORATORY DATA:  I have reviewed the data as listed   Chemistry      Component Value Date/Time   NA 140 05/11/2014 1334   NA 140 02/25/2014 1030   K 3.3* 05/11/2014 1334   K 3.9 02/25/2014 1030   CL 100 02/25/2014 1030   CO2 26 05/11/2014 1334   CO2 24 02/25/2014 1030   BUN 6.1* 05/11/2014 1334   BUN 17 02/25/2014 1030   CREATININE 1.0 05/11/2014 1334   CREATININE 1.19* 02/25/2014 1030      Component Value Date/Time   CALCIUM 8.9 05/11/2014 1334   CALCIUM 9.5 02/25/2014 1030   ALKPHOS 95 05/11/2014 1334   ALKPHOS 131* 09/22/2009 1010   AST 23 05/11/2014 1334   AST 156* 09/22/2009 1010   ALT 33 05/11/2014 1334   ALT 110* 09/22/2009 1010   BILITOT 0.34 05/11/2014 1334   BILITOT 1.0 09/22/2009 1010       Lab Results  Component Value Date   WBC 10.6* 05/11/2014   HGB 9.3* 05/11/2014   HCT 27.9* 05/11/2014   MCV 78.4* 05/11/2014   PLT 273 05/11/2014   NEUTROABS 9.0* 05/11/2014   ASSESSMENT & PLAN:  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 Patient  started systemic adjuvant chemotherapy with dose dense Adriamycin Cytoxan. She started chemotherapy in 03/30/2014. Dose of chemotherapy was reduced after cycle 1 for severe neutropenia. And again before cycle 4.   The lab were reviewed in detail and were relatively stable. Her ANC is up to 9.0 and we will proceed with her 4th and final cycle of AC today. Her hgb is down to 9.3 but she is asymptomatic at this time. We will continue to monitor these values. She will continue to treat her nausea with her PRN antiemetics and diarrhea with imodium as needed.   Ms. Llanas will return next week for labs and a nadir visit. At this time she will discuss abraxane weekly x 12.  No orders of the defined types were placed in this encounter.   The patient has a good understanding of the overall plan. she agrees with it. She will call with any problems that may develop before her next visit here.   Marcelino Duster, NP 05/11/2014 2:54 PM

## 2014-05-11 NOTE — Telephone Encounter (Signed)
per pof to ch pt appt-gave pt copy of sch

## 2014-05-12 ENCOUNTER — Ambulatory Visit (HOSPITAL_BASED_OUTPATIENT_CLINIC_OR_DEPARTMENT_OTHER): Payer: Medicare Other

## 2014-05-12 DIAGNOSIS — C50411 Malignant neoplasm of upper-outer quadrant of right female breast: Secondary | ICD-10-CM | POA: Diagnosis not present

## 2014-05-12 DIAGNOSIS — Z5189 Encounter for other specified aftercare: Secondary | ICD-10-CM

## 2014-05-12 MED ORDER — PEGFILGRASTIM INJECTION 6 MG/0.6ML ~~LOC~~
6.0000 mg | PREFILLED_SYRINGE | Freq: Once | SUBCUTANEOUS | Status: AC
  Administered 2014-05-12: 6 mg via SUBCUTANEOUS
  Filled 2014-05-12: qty 0.6

## 2014-05-12 NOTE — Patient Instructions (Signed)
Pegfilgrastim injection What is this medicine? PEGFILGRASTIM (peg fil GRA stim) is a long-acting granulocyte colony-stimulating factor that stimulates the growth of neutrophils, a type of white blood cell important in the body's fight against infection. It is used to reduce the incidence of fever and infection in patients with certain types of cancer who are receiving chemotherapy that affects the bone marrow. This medicine may be used for other purposes; ask your health care provider or pharmacist if you have questions. COMMON BRAND NAME(S): Neulasta What should I tell my health care provider before I take this medicine? They need to know if you have any of these conditions: -latex allergy -ongoing radiation therapy -sickle cell disease -skin reactions to acrylic adhesives (On-Body Injector only) -an unusual or allergic reaction to pegfilgrastim, filgrastim, other medicines, foods, dyes, or preservatives -pregnant or trying to get pregnant -breast-feeding How should I use this medicine? This medicine is for injection under the skin. If you get this medicine at home, you will be taught how to prepare and give the pre-filled syringe or how to use the On-body Injector. Refer to the patient Instructions for Use for detailed instructions. Use exactly as directed. Take your medicine at regular intervals. Do not take your medicine more often than directed. It is important that you put your used needles and syringes in a special sharps container. Do not put them in a trash can. If you do not have a sharps container, call your pharmacist or healthcare provider to get one. Talk to your pediatrician regarding the use of this medicine in children. Special care may be needed. Overdosage: If you think you have taken too much of this medicine contact a poison control center or emergency room at once. NOTE: This medicine is only for you. Do not share this medicine with others. What if I miss a dose? It is  important not to miss your dose. Call your doctor or health care professional if you miss your dose. If you miss a dose due to an On-body Injector failure or leakage, a new dose should be administered as soon as possible using a single prefilled syringe for manual use. What may interact with this medicine? Interactions have not been studied. Give your health care provider a list of all the medicines, herbs, non-prescription drugs, or dietary supplements you use. Also tell them if you smoke, drink alcohol, or use illegal drugs. Some items may interact with your medicine. This list may not describe all possible interactions. Give your health care provider a list of all the medicines, herbs, non-prescription drugs, or dietary supplements you use. Also tell them if you smoke, drink alcohol, or use illegal drugs. Some items may interact with your medicine. What should I watch for while using this medicine? You may need blood work done while you are taking this medicine. If you are going to need a MRI, CT scan, or other procedure, tell your doctor that you are using this medicine (On-Body Injector only). What side effects may I notice from receiving this medicine? Side effects that you should report to your doctor or health care professional as soon as possible: -allergic reactions like skin rash, itching or hives, swelling of the face, lips, or tongue -dizziness -fever -pain, redness, or irritation at site where injected -pinpoint red spots on the skin -shortness of breath or breathing problems -stomach or side pain, or pain at the shoulder -swelling -tiredness -trouble passing urine Side effects that usually do not require medical attention (report to your doctor   or health care professional if they continue or are bothersome): -bone pain -muscle pain This list may not describe all possible side effects. Call your doctor for medical advice about side effects. You may report side effects to FDA at  1-800-FDA-1088. Where should I keep my medicine? Keep out of the reach of children. Store pre-filled syringes in a refrigerator between 2 and 8 degrees C (36 and 46 degrees F). Do not freeze. Keep in carton to protect from light. Throw away this medicine if it is left out of the refrigerator for more than 48 hours. Throw away any unused medicine after the expiration date. NOTE: This sheet is a summary. It may not cover all possible information. If you have questions about this medicine, talk to your doctor, pharmacist, or health care provider.  2015, [employer]vier/Gold Standard. (2013-07-31 16:14:05)  

## 2014-05-14 ENCOUNTER — Other Ambulatory Visit: Payer: Self-pay | Admitting: Hematology and Oncology

## 2014-05-15 DIAGNOSIS — J189 Pneumonia, unspecified organism: Secondary | ICD-10-CM

## 2014-05-15 HISTORY — DX: Pneumonia, unspecified organism: J18.9

## 2014-05-16 ENCOUNTER — Encounter: Payer: Self-pay | Admitting: Hematology and Oncology

## 2014-05-17 ENCOUNTER — Inpatient Hospital Stay (HOSPITAL_COMMUNITY)
Admission: EM | Admit: 2014-05-17 | Discharge: 2014-05-26 | DRG: 871 | Disposition: A | Discharge status: Skilled Nursing Facility | Payer: Medicare Other | Attending: Internal Medicine | Admitting: Internal Medicine

## 2014-05-17 ENCOUNTER — Encounter (HOSPITAL_COMMUNITY): Payer: Self-pay | Admitting: *Deleted

## 2014-05-17 ENCOUNTER — Emergency Department (HOSPITAL_COMMUNITY): Payer: Medicare Other

## 2014-05-17 DIAGNOSIS — Z888 Allergy status to other drugs, medicaments and biological substances status: Secondary | ICD-10-CM | POA: Diagnosis not present

## 2014-05-17 DIAGNOSIS — R197 Diarrhea, unspecified: Secondary | ICD-10-CM | POA: Diagnosis not present

## 2014-05-17 DIAGNOSIS — R6510 Systemic inflammatory response syndrome (SIRS) of non-infectious origin without acute organ dysfunction: Secondary | ICD-10-CM

## 2014-05-17 DIAGNOSIS — Z885 Allergy status to narcotic agent status: Secondary | ICD-10-CM

## 2014-05-17 DIAGNOSIS — F419 Anxiety disorder, unspecified: Secondary | ICD-10-CM | POA: Diagnosis present

## 2014-05-17 DIAGNOSIS — Z881 Allergy status to other antibiotic agents status: Secondary | ICD-10-CM

## 2014-05-17 DIAGNOSIS — J189 Pneumonia, unspecified organism: Secondary | ICD-10-CM | POA: Diagnosis present

## 2014-05-17 DIAGNOSIS — F17210 Nicotine dependence, cigarettes, uncomplicated: Secondary | ICD-10-CM | POA: Diagnosis not present

## 2014-05-17 DIAGNOSIS — R0989 Other specified symptoms and signs involving the circulatory and respiratory systems: Secondary | ICD-10-CM | POA: Diagnosis not present

## 2014-05-17 DIAGNOSIS — E876 Hypokalemia: Secondary | ICD-10-CM | POA: Diagnosis not present

## 2014-05-17 DIAGNOSIS — Z79899 Other long term (current) drug therapy: Secondary | ICD-10-CM

## 2014-05-17 DIAGNOSIS — D630 Anemia in neoplastic disease: Secondary | ICD-10-CM | POA: Diagnosis present

## 2014-05-17 DIAGNOSIS — A419 Sepsis, unspecified organism: Secondary | ICD-10-CM | POA: Diagnosis not present

## 2014-05-17 DIAGNOSIS — D61810 Antineoplastic chemotherapy induced pancytopenia: Secondary | ICD-10-CM | POA: Diagnosis present

## 2014-05-17 DIAGNOSIS — Z9889 Other specified postprocedural states: Secondary | ICD-10-CM

## 2014-05-17 DIAGNOSIS — Z90710 Acquired absence of both cervix and uterus: Secondary | ICD-10-CM | POA: Diagnosis not present

## 2014-05-17 DIAGNOSIS — E785 Hyperlipidemia, unspecified: Secondary | ICD-10-CM | POA: Diagnosis present

## 2014-05-17 DIAGNOSIS — R5081 Fever presenting with conditions classified elsewhere: Secondary | ICD-10-CM

## 2014-05-17 DIAGNOSIS — F329 Major depressive disorder, single episode, unspecified: Secondary | ICD-10-CM | POA: Diagnosis present

## 2014-05-17 DIAGNOSIS — A047 Enterocolitis due to Clostridium difficile: Secondary | ICD-10-CM | POA: Diagnosis present

## 2014-05-17 DIAGNOSIS — Z09 Encounter for follow-up examination after completed treatment for conditions other than malignant neoplasm: Secondary | ICD-10-CM

## 2014-05-17 DIAGNOSIS — J811 Chronic pulmonary edema: Secondary | ICD-10-CM | POA: Diagnosis not present

## 2014-05-17 DIAGNOSIS — D709 Neutropenia, unspecified: Secondary | ICD-10-CM | POA: Diagnosis present

## 2014-05-17 DIAGNOSIS — E780 Pure hypercholesterolemia: Secondary | ICD-10-CM | POA: Diagnosis present

## 2014-05-17 DIAGNOSIS — C50419 Malignant neoplasm of upper-outer quadrant of unspecified female breast: Secondary | ICD-10-CM | POA: Diagnosis present

## 2014-05-17 DIAGNOSIS — Z882 Allergy status to sulfonamides status: Secondary | ICD-10-CM | POA: Diagnosis not present

## 2014-05-17 DIAGNOSIS — K056 Periodontal disease, unspecified: Secondary | ICD-10-CM | POA: Diagnosis not present

## 2014-05-17 DIAGNOSIS — J9601 Acute respiratory failure with hypoxia: Secondary | ICD-10-CM | POA: Diagnosis present

## 2014-05-17 DIAGNOSIS — Z9011 Acquired absence of right breast and nipple: Secondary | ICD-10-CM | POA: Diagnosis present

## 2014-05-17 DIAGNOSIS — A0472 Enterocolitis due to Clostridium difficile, not specified as recurrent: Secondary | ICD-10-CM | POA: Diagnosis present

## 2014-05-17 DIAGNOSIS — Z720 Tobacco use: Secondary | ICD-10-CM

## 2014-05-17 DIAGNOSIS — R112 Nausea with vomiting, unspecified: Secondary | ICD-10-CM | POA: Diagnosis not present

## 2014-05-17 DIAGNOSIS — J90 Pleural effusion, not elsewhere classified: Secondary | ICD-10-CM | POA: Diagnosis not present

## 2014-05-17 DIAGNOSIS — Z87442 Personal history of urinary calculi: Secondary | ICD-10-CM

## 2014-05-17 DIAGNOSIS — C50411 Malignant neoplasm of upper-outer quadrant of right female breast: Secondary | ICD-10-CM | POA: Diagnosis not present

## 2014-05-17 DIAGNOSIS — I959 Hypotension, unspecified: Secondary | ICD-10-CM | POA: Diagnosis present

## 2014-05-17 DIAGNOSIS — J9811 Atelectasis: Secondary | ICD-10-CM | POA: Diagnosis not present

## 2014-05-17 DIAGNOSIS — Z9049 Acquired absence of other specified parts of digestive tract: Secondary | ICD-10-CM | POA: Diagnosis present

## 2014-05-17 LAB — COMPREHENSIVE METABOLIC PANEL
ALK PHOS: 75 U/L (ref 39–117)
ALT: 20 U/L (ref 0–35)
ANION GAP: 6 (ref 5–15)
AST: 18 U/L (ref 0–37)
Albumin: 2.8 g/dL — ABNORMAL LOW (ref 3.5–5.2)
BUN: 15 mg/dL (ref 6–23)
CO2: 25 mmol/L (ref 19–32)
Calcium: 7.8 mg/dL — ABNORMAL LOW (ref 8.4–10.5)
Chloride: 101 mEq/L (ref 96–112)
Creatinine, Ser: 0.82 mg/dL (ref 0.50–1.10)
GFR calc Af Amer: 83 mL/min — ABNORMAL LOW (ref >90)
GFR, EST NON AFRICAN AMERICAN: 72 mL/min — AB (ref >90)
Glucose, Bld: 188 mg/dL — ABNORMAL HIGH (ref 70–99)
Potassium: 3.4 mmol/L — ABNORMAL LOW (ref 3.5–5.1)
SODIUM: 132 mmol/L — AB (ref 135–145)
Total Bilirubin: 1 mg/dL (ref 0.3–1.2)
Total Protein: 5.5 g/dL — ABNORMAL LOW (ref 6.0–8.3)

## 2014-05-17 LAB — CBC WITH DIFFERENTIAL/PLATELET
HCT: 26.4 % — ABNORMAL LOW (ref 36.0–46.0)
Hemoglobin: 8.7 g/dL — ABNORMAL LOW (ref 12.0–15.0)
MCH: 26.1 pg (ref 26.0–34.0)
MCHC: 33 g/dL (ref 30.0–36.0)
MCV: 79.3 fL (ref 78.0–100.0)
Platelets: 122 10*3/uL — ABNORMAL LOW (ref 150–400)
RBC: 3.33 MIL/uL — ABNORMAL LOW (ref 3.87–5.11)
RDW: 15.2 % (ref 11.5–15.5)
WBC: 0.1 10*3/uL — CL (ref 4.0–10.5)

## 2014-05-17 LAB — I-STAT CG4 LACTIC ACID, ED: Lactic Acid, Venous: 1.91 mmol/L (ref 0.5–2.2)

## 2014-05-17 MED ORDER — VANCOMYCIN HCL IN DEXTROSE 750-5 MG/150ML-% IV SOLN
750.0000 mg | Freq: Two times a day (BID) | INTRAVENOUS | Status: DC
  Administered 2014-05-18 – 2014-05-19 (×3): 750 mg via INTRAVENOUS
  Filled 2014-05-17 (×3): qty 150

## 2014-05-17 MED ORDER — SODIUM CHLORIDE 0.9 % IV BOLUS (SEPSIS)
1000.0000 mL | Freq: Once | INTRAVENOUS | Status: AC
  Administered 2014-05-17: 1000 mL via INTRAVENOUS

## 2014-05-17 MED ORDER — PIPERACILLIN-TAZOBACTAM 3.375 G IVPB 30 MIN
3.3750 g | Freq: Once | INTRAVENOUS | Status: AC
  Administered 2014-05-17: 3.375 g via INTRAVENOUS
  Filled 2014-05-17: qty 50

## 2014-05-17 MED ORDER — ACETAMINOPHEN 500 MG PO TABS
1000.0000 mg | ORAL_TABLET | Freq: Once | ORAL | Status: AC
  Administered 2014-05-17: 1000 mg via ORAL
  Filled 2014-05-17: qty 2

## 2014-05-17 MED ORDER — VANCOMYCIN HCL IN DEXTROSE 1-5 GM/200ML-% IV SOLN
1000.0000 mg | Freq: Once | INTRAVENOUS | Status: AC
  Administered 2014-05-17: 1000 mg via INTRAVENOUS
  Filled 2014-05-17: qty 200

## 2014-05-17 MED ORDER — PIPERACILLIN-TAZOBACTAM 3.375 G IVPB: 3.3750 g | Freq: Three times a day (TID) | INTRAVENOUS | Status: DC

## 2014-05-17 NOTE — Progress Notes (Signed)
ANTIBIOTIC CONSULT NOTE - INITIAL  Pharmacy Consult for:  Vancomycin and Zosyn Indication:  Sepsis  Allergies  Allergen Reactions  . Ciprofloxacin Nausea And Vomiting  . Demerol [Meperidine] Hives  . Lidocaine     palpatations  . Other Swelling    Eye ointments  . Septra [Sulfamethoxazole-Trimethoprim] Nausea And Vomiting  . Codeine Rash  . Novocain [Procaine] Palpitations    Patient Measurements: Height: 5\' 3"  (160 cm) Weight: 193 lb (87.544 kg) IBW/kg (Calculated) : 52.4   Vital Signs: Temp: 101.1 F (38.4 C) (01/03 2142) Temp Source: Rectal (01/03 2142) BP: 94/52 mmHg (01/03 2200) Pulse Rate: 95 (01/03 2200)  Labs:  Recent Labs  05/17/14 2054  WBC 0.1*  HGB 8.7*  PLT 122*  CREATININE 0.82   Estimated Creatinine Clearance: 68.8 mL/min (by C-G formula based on Cr of 0.82).   Microbiology: Blood and urine cultures are pending.  Medical History: Past Medical History  Diagnosis Date  . Anxiety   . Back pain   . H/O bladder problems   . Cholelithiasis   . Depression   . High blood pressure   . Hypercholesteremia   . Kidney stones   . Gum disease   . Complication of anesthesia     bp goes up  . Wears glasses   . Wears dentures     top    Medications:  Pending  Assessment:  Asked to assist with antibiotic therapy -- Vancomycin and Zosyn -- for this 69 year-old female with sepsis.  According to the medical record, Ms. Dowson visited the ED on 05/17/13 with the complaint of nausea, vomiting, and diarrhea.  She is febrile and has a critical WBC result of 0.1.  Ms. Mccaster has been receiving chemotherapy for right breast invasive ductal carcinoma.  Her 4th and final cycle of Adriamycin and Cytoxan was administered on 05/11/14.  Neulasta 6mg  SQ was administered on 05/12/14.  The first doses of Vancomycin and Zosyn have been given in the ED.  Goals of Therapy:   Vancomycin trough levels 15-20 mcg/ml  Eradication of infection  Plan:   Vancomycin  750 mg IV every 12 hours  Zosyn 3.375 grams IV every 8 hours, each dose infused over 4 hours.  [place]. 05/17/2014,10:13 PM

## 2014-05-17 NOTE — ED Notes (Signed)
BP noted Patient asymptomatic Dr. Carles Collet present in ED Patient bed to be changed to [facility] Patient to be on Droplet Precautions due to Neutropenic Fever

## 2014-05-17 NOTE — ED Notes (Signed)
Patient states that she has been told that she is cancer free, but is still actively receiving chemo tx

## 2014-05-17 NOTE — ED Notes (Signed)
Critical WBC of 0.1 reported to [place], [place].

## 2014-05-17 NOTE — ED Notes (Signed)
Patient states that she was placed on antibiotics for a dental abscess  Patient states that she believes the antibiotics have caused N/V/D Patient started antibiotics on Tuesday and stopped taking on Wednesday  Patient with hx of breast cancer Patient actively vomiting during triage

## 2014-05-17 NOTE — ED Notes (Signed)
Dr Tat at bedside 

## 2014-05-17 NOTE — ED Provider Notes (Signed)
CSN: [id number]     Arrival date & time 05/17/14  1959 History   First MD Initiated Contact with Patient 05/17/14 2032     Chief Complaint  Patient presents with  . Nausea  . Diarrhea     (Consider location/radiation/quality/duration/timing/severity/associated sxs/prior Treatment) HPI Comments: Patient with history of breast cancer presents to the emergency department with chief complaints of nausea, and diarrhea. Patient states that she just finished her chemotherapy for breast cancer. She has had some diarrhea associated with chemotherapy, for which she takes Imodium. She has also been taking Augmentin for dental infection, and she believes this is caused her to feel nauseated and has given her some diarrhea as well. She denies being in any pain. Denies chest pain or abdominal pain specifically. She has not taken anything to alleviate her symptoms. She states that she does take Neulasta.  PCP Dr. Rex Kras  The history is provided by the patient. No language interpreter was used.    Past Medical History  Diagnosis Date  . Anxiety   . Back pain   . H/O bladder problems   . Cholelithiasis   . Depression   . High blood pressure   . Hypercholesteremia   . Kidney stones   . Gum disease   . Complication of anesthesia     bp goes up  . Wears glasses   . Wears dentures     top   Past Surgical History  Procedure Laterality Date  . Colon biopsy    . Mouth surgery    . Spine surgery    . Colonoscopy    . Tubal ligation    . Lithotripsy    . Rhinoplasty    . Back surgery  1989    lumb lam  . Breast biopsy Right 02/05/2014    invasive ductal cncer  . Abdominal hysterectomy  1982  . Cholecystectomy  2011    lap choli  . Radioactive seed guided mastectomy with axillary sentinel lymph node biopsy Right 02/27/2014    Procedure: RADIOACTIVE SEED GUIDED RIGHT PARTIAL MASTECTOMY WITH AXILLARY SENTINEL LYMPH NODE BIOPSY;  Surgeon: Stark Klein, MD;  Location: [facility];   Service: General;  Laterality: Right;  . Portacath placement N/A 02/27/2014    Procedure: INSERTION PORT-A-CATH;  Surgeon: Stark Klein, MD;  Location: [facility];  Service: General;  Laterality: N/A;   History reviewed. No pertinent family history. History  Substance Use Topics  . Smoking status: Current Every Day Smoker -- 1.50 packs/day    Types: Cigarettes  . Smokeless tobacco: Not on file  . Alcohol Use: No   OB History    No data available     Review of Systems  Constitutional: Positive for fever and chills.  Respiratory: Negative for shortness of breath.   Cardiovascular: Negative for chest pain.  Gastrointestinal: Positive for nausea and diarrhea. Negative for vomiting and constipation.  Genitourinary: Negative for dysuria.  All other systems reviewed and are negative.     Allergies  Ciprofloxacin; Demerol; Lidocaine; Other; Septra; Codeine; and Novocain  Home Medications   Prior to Admission medications   Medication Sig Start Date End Date Taking? Authorizing Provider  acetaminophen (TYLENOL) 325 MG tablet Take 650 mg by mouth every 6 (six) hours as needed.    Historical Provider, MD  ALPRAZolam Duanne Moron) 0.25 MG tablet Take 0.25 mg by mouth 2 (two) times daily as needed for anxiety.    Historical Provider, MD  aspirin 81 MG tablet Take 81  mg by mouth daily.    Historical Provider, MD  cholecalciferol (VITAMIN D) 1000 UNITS tablet Take 2,000 Units by mouth daily.    Historical Provider, MD  cyclobenzaprine (FLEXERIL) 5 MG tablet Take 5 mg by mouth 3 (three) times daily as needed for muscle spasms.    Historical Provider, MD  dexamethasone (DECADRON) 4 MG tablet Take 2 tablets by mouth once a day on the day after chemotherapy and then take 2 tablets two times a day for 2 days. Take with food. 03/30/14   Rulon Eisenmenger, MD  FLUoxetine (PROZAC) 10 MG capsule Take 10 mg by mouth daily.    Historical Provider, MD  lidocaine-prilocaine (EMLA) cream Apply 1  application topically as needed. 03/30/14   Rulon Eisenmenger, MD  LORazepam (ATIVAN) 0.5 MG tablet TAKE 1 TABLET EVERY 6 HOURS AS NEEDED 05/14/14   Rulon Eisenmenger, MD  ondansetron (ZOFRAN) 8 MG tablet Take 1 tablet (8 mg total) by mouth 2 (two) times daily as needed. Start on the third day after chemotherapy. 03/30/14   Rulon Eisenmenger, MD  oxyCODONE-acetaminophen (PERCOCET/ROXICET) 5-325 MG per tablet  02/27/14   Historical Provider, MD  Probiotic Product (PROBIOTIC DAILY PO) Take by mouth daily.    Historical Provider, MD  prochlorperazine (COMPAZINE) 10 MG tablet Take 1 tablet (10 mg total) by mouth every 6 (six) hours as needed (Nausea or vomiting). Patient not taking: Reported on 04/27/2014 03/30/14   Rulon Eisenmenger, MD  ranitidine (ZANTAC) 150 MG tablet Take 150 mg by mouth as needed.     Historical Provider, MD  rosuvastatin (CRESTOR) 20 MG tablet Take 20 mg by mouth daily.    Historical Provider, MD  UNABLE TO FIND Apply 1 each topically as needed. 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  ICD-10 C50.411 03/30/14   Rulon Eisenmenger, MD  valsartan (DIOVAN) 320 MG tablet Take 320 mg by mouth daily.    Historical Provider, MD   BP 87/72 mmHg  Pulse 109  Temp(Src) 103.9 F (39.9 C) (Rectal)  Resp 18  SpO2 100% Physical Exam  Constitutional: She is oriented to person, place, and time. She appears well-developed and well-nourished.  HENT:  Head: Normocephalic and atraumatic.  Very dry mucous membranes  Poor dentition throughout.  Affected tooth as diagrammed.  No signs of peritonsillar or tonsillar abscess.  No signs of gingival abscess. Oropharynx is clear and without exudates.  Uvula is midline.  Airway is intact. No signs of Ludwig's angina with palpation of oral and sublingual mucosa.   Eyes: Conjunctivae and EOM are normal. Pupils are equal, round, and reactive to light.  Neck: Normal range of motion. Neck supple.  Cardiovascular:  Regular rhythm.  Exam reveals no gallop and no friction rub.   No murmur heard. Tachycardic  Pulmonary/Chest: Effort normal and breath sounds normal. No respiratory distress. She has no wheezes. She has no rales. She exhibits no tenderness.  Abdominal: Soft. She exhibits no distension and no mass. There is no tenderness. There is no rebound and no guarding.  Musculoskeletal: Normal range of motion. She exhibits no edema or tenderness.  Neurological: She is alert and oriented to person, place, and time.  Skin: Skin is warm and dry.  Psychiatric: She has a normal mood and affect. Her behavior is normal. Judgment and thought content normal.  Nursing note and vitals reviewed.   ED Course  Procedures (including critical care time) Results for orders placed or performed  during the hospital encounter of 05/17/14  CBC WITH DIFFERENTIAL  Result Value Ref Range   WBC 0.1 (LL) 4.0 - 10.5 K/uL   RBC 3.33 (L) 3.87 - 5.11 MIL/uL   Hemoglobin 8.7 (L) 12.0 - 15.0 g/dL   HCT 26.4 (L) 36.0 - 46.0 %   MCV 79.3 78.0 - 100.0 fL   MCH 26.1 26.0 - 34.0 pg   MCHC 33.0 30.0 - 36.0 g/dL   RDW 15.2 11.5 - 15.5 %   Platelets 122 (L) 150 - 400 K/uL   Neutrophils Relative % PENDING 43 - 77 %   Neutro Abs PENDING 1.7 - 7.7 K/uL   Band Neutrophils PENDING 0 - 10 %   Lymphocytes Relative PENDING 12 - 46 %   Lymphs Abs PENDING 0.7 - 4.0 K/uL   Monocytes Relative PENDING 3 - 12 %   Monocytes Absolute PENDING 0.1 - 1.0 K/uL   Eosinophils Relative PENDING 0 - 5 %   Eosinophils Absolute PENDING 0.0 - 0.7 K/uL   Basophils Relative PENDING 0 - 1 %   Basophils Absolute PENDING 0.0 - 0.1 K/uL   WBC Morphology PENDING    RBC Morphology PENDING    Smear Review PENDING    nRBC PENDING 0 /100 WBC   Metamyelocytes Relative PENDING %   Myelocytes PENDING %   Promyelocytes Absolute PENDING %   Blasts PENDING %  Comprehensive metabolic panel  Result Value Ref Range   Sodium 132 (L) 135 - 145 mmol/L   Potassium 3.4 (L)  3.5 - 5.1 mmol/L   Chloride 101 96 - 112 mEq/L   CO2 25 19 - 32 mmol/L   Glucose, Bld 188 (H) 70 - 99 mg/dL   BUN 15 6 - 23 mg/dL   Creatinine, Ser 0.82 0.50 - 1.10 mg/dL   Calcium 7.8 (L) 8.4 - 10.5 mg/dL   Total Protein 5.5 (L) 6.0 - 8.3 g/dL   Albumin 2.8 (L) 3.5 - 5.2 g/dL   AST 18 0 - 37 U/L   ALT 20 0 - 35 U/L   Alkaline Phosphatase 75 39 - 117 U/L   Total Bilirubin 1.0 0.3 - 1.2 mg/dL   GFR calc non Af Amer 72 (L) >90 mL/min   GFR calc Af Amer 83 (L) >90 mL/min   Anion gap 6 5 - 15  I-Stat CG4 Lactic Acid, ED  Result Value Ref Range   Lactic Acid, Venous 1.91 0.5 - 2.2 mmol/L   Dg Chest Port 1 View  05/17/2014   CLINICAL DATA:  Acute onset of vomiting, nausea and diarrhea. Fever. Initial encounter.  EXAM: PORTABLE CHEST - 1 VIEW  COMPARISON:  Chest radiograph performed 02/27/2014  FINDINGS: The lungs are well-aerated. Mild vascular congestion is noted. Mild right basilar opacity may reflect atelectasis or possibly pneumonia. There is no evidence of pleural effusion or pneumothorax.  The cardiomediastinal silhouette is within normal limits. No acute osseous abnormalities are seen. The right-sided chest port is noted ending about the mid SVC. Scattered clips are seen overlying the right breast.  IMPRESSION: Mild vascular congestion noted; mild right basilar airspace opacity may reflect atelectasis or possibly pneumonia.   Electronically Signed   By: Garald Balding M.D.   On: 05/17/2014 21:12     Imaging Review No results found.   EKG Interpretation None      MDM   Final diagnoses:  Sepsis, due to unspecified organism  Neutropenic fever  Healthcare-associated pneumonia    Patient with fever to 103.9  rectally, heart rate 122, blood pressure 86/60. Patient is a cancer patient.  Code sepsis initiated.  Patient immediately seen by myself and Dr. Kenton Kingfisher.  Chest x-ray remarkable for atelectasis versus right-sided opacity possibly pneumonia. Patient also has a white blood  cell count of 0.1. Will treat for neutropenic fever and cover with broad-spectrum antibiotics already started with code sepsis initiation.  Medications  sodium chloride 0.9 % bolus 1,000 mL (0 mLs Intravenous Stopped 05/17/14 2227)  piperacillin-tazobactam (ZOSYN) IVPB 3.375 g (0 g Intravenous Stopped 05/17/14 2115)  vancomycin (VANCOCIN) IVPB 1000 mg/200 mL premix (0 mg Intravenous Stopped 05/17/14 2227)  acetaminophen (TYLENOL) tablet 1,000 mg (1,000 mg Oral Given 05/17/14 2115)     CRITICAL CARE Performed by: Montine Circle   Total critical care time: 45  Critical care time was exclusive of separately billable procedures and treating other patients.  Critical care was necessary to treat or prevent imminent or life-threatening deterioration.  Critical care was time spent personally by me on the following activities: development of treatment plan with patient and/or surrogate as well as nursing, discussions with consultants, evaluation of patient's response to treatment, examination of patient, obtaining history from patient or surrogate, ordering and performing treatments and interventions, ordering and review of laboratory studies, ordering and review of radiographic studies, pulse oximetry and re-evaluation of patient's condition.   Montine Circle, PA-C 05/17/14 2254  Pamella Pert, MD 05/17/14 [phone]

## 2014-05-17 NOTE — H&P (Signed)
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 The patient received chemotherapy on 05/11/2014. She received Neulasta on 05/12/2014 under guidance of Dr. Lindi Adie. On the morning of admission, the patient began experiencing fevers and chills that prompted her to come to the emergency department. The patient denies any headache, chest pain, shortness breath, coughing, hemoptysis, vomiting, abdominal pain, dysuria, hematuria. Notably, the patient began noticing some pain in her left lower gums. She went to see her dentist on 05/12/2014. The patient was placed on Augmentin. She only took it 2 days because of diarrhea and nausea. She states that even with the 2 days of antibiotics for gum swelling has improved. Because of her diarrhea, the patient was also placed on Flagyl 250 mg every 8 hours by her dentist. Again, she only took this 2 days because it caused nausea. The patient had 1 loose stool   on the day of admissionwithout any hematochezia or melena. she denies any unusual rashes or synovitis or neck pain. The patient denies any coughing, shortness of breath, hemoptysis.  In the emergency department, the patient was noted to be hypotensive with systolic blood pressure of 75/57. After 1 L normal saline and this improved to 123/80. The patient had a fever of 103.65F. Sodium was 132, potassium 3.4, serum creatinine 0.82. WBC was 0.1. Hemoglobin is 8.7, platelets 122,000. Assessment/Plan:  Febrile neutropenia -Unfortunately differential was not performed on CBC -Continue vancomycin -Start cefepime -UA and urine culture -Suggest possible right basilar opacity -Blood cultures 2 sets -Influenza  PCR -Lactic acid 1.91 -?dental abscess--no edema or pain in jaw, no drainage from gums -CT jaw if swelling occurs Diarrhea -C. difficile PCR -Patient recently on Augmentin SIRS -fever with hypotension -IVF -f/u culture data Invasive ductal carcinoma of Breast -Last chemotherapy 05/11/2014 with Neulasta on 05/12/2014 -placed pt on Dr. Geralyn Flash list Anxiety/Depression -Continue home dose of Ativan and Prozac Pulmonary opacity -Clinically the patient does not appear to have pneumonia -Repeat chest x-ray after fluid resuscitation -Certainly, the patient's neutropenia may blunt the patient's inflammatory response and therefore blunt development of  pulmonary infiltrate     Past Medical History  Diagnosis Date  . Anxiety   . Back pain   . H/O bladder problems   . Cholelithiasis   . Depression   . High blood pressure   . Hypercholesteremia   . Kidney stones   . Gum disease   . Complication of anesthesia     bp goes up  . Wears glasses   . Wears dentures     top   Past Surgical History  Procedure Laterality Date  . Colon biopsy    . Mouth surgery    . Spine surgery    . Colonoscopy    . Tubal ligation    . Lithotripsy    . Rhinoplasty    . Back surgery  1989    lumb lam  . Breast biopsy Right 02/05/2014    invasive ductal cncer  . Abdominal hysterectomy  1982  . Cholecystectomy  2011    lap choli  . Radioactive seed guided mastectomy with axillary sentinel lymph node biopsy Right 02/27/2014    Procedure: RADIOACTIVE SEED GUIDED RIGHT PARTIAL MASTECTOMY WITH AXILLARY SENTINEL  LYMPH NODE BIOPSY;  Surgeon: Stark Klein, MD;  Location: [facility];  Service: General;  Laterality: Right;  . Portacath placement N/A 02/27/2014    Procedure: INSERTION PORT-A-CATH;  Surgeon: Stark Klein, MD;  Location: [facility];  Service: General;  Laterality: N/A;   Social History:  reports that she has been smoking Cigarettes.  She has been smoking about  1.50 packs per day. She does not have any smokeless tobacco history on file. She reports that she does not drink alcohol or use illicit drugs.   History reviewed. No pertinent family history.   Allergies  Allergen Reactions  . Ciprofloxacin Nausea And Vomiting  . Demerol [Meperidine] Hives  . Lidocaine     palpatations  . Other Swelling    Eye ointments  . Septra [Sulfamethoxazole-Trimethoprim] Nausea And Vomiting  . Codeine Rash  . Novocain [Procaine] Palpitations      Prior to Admission medications   Medication Sig Start Date End Date Taking? Authorizing Provider  acetaminophen (TYLENOL) 325 MG tablet Take 325 mg by mouth every 6 (six) hours as needed for moderate pain.    Yes Historical Provider, MD  amoxicillin-clavulanate (AUGMENTIN) 500-125 MG per tablet Take 500 mg by mouth 2 (two) times daily.  05/12/14  Yes Historical Provider, MD  Calcium Carbonate-Simethicone (MAALOX ADVANCED MAX ST) 1000-60 MG CHEW Chew 1 tablet by mouth every 6 (six) hours as needed. Heart burn   Yes Historical Provider, MD  cholecalciferol (VITAMIN D) 1000 UNITS tablet Take 1,000 Units by mouth daily.    Yes Historical Provider, MD  dexamethasone (DECADRON) 4 MG tablet Take 2 tablets by mouth once a day on the day after chemotherapy and then take 2 tablets two times a day for 2 days. Take with food. 03/30/14  Yes Rulon Eisenmenger, MD  diphenhydrAMINE (BENADRYL) 25 MG tablet Take 25 mg by mouth 2 (two) times daily.   Yes Historical Provider, MD  FLUoxetine (PROZAC) 10 MG capsule Take 10 mg by mouth daily.   Yes Historical Provider, MD  lidocaine-prilocaine (EMLA) cream Apply 1 application topically as needed. Patient taking differently: Apply 1 application topically as needed (port).  03/30/14  Yes Rulon Eisenmenger, MD  loperamide (IMODIUM) 2 MG capsule Take 2 mg by mouth daily as needed for diarrhea or loose stools.   Yes Historical Provider, MD  LORazepam (ATIVAN) 0.5 MG tablet Take 0.5 mg by mouth every 8  (eight) hours as needed. Nausea   Yes Historical Provider, MD  metroNIDAZOLE (FLAGYL) 250 MG tablet Take 250 mg by mouth 3 (three) times daily.  05/12/14  Yes Historical Provider, MD  Probiotic Product (PROBIOTIC DAILY PO) Take 1 tablet by mouth daily.    Yes Historical Provider, MD  ranitidine (ZANTAC) 150 MG tablet Take 150 mg by mouth 2 (two) times daily as needed for heartburn.    Yes Historical Provider, MD  LORazepam (ATIVAN) 0.5 MG tablet TAKE 1 TABLET EVERY 6 HOURS AS NEEDED Patient not taking: Reported on 05/17/2014 05/14/14   Rulon Eisenmenger, MD  ondansetron (ZOFRAN) 8 MG tablet Take 1 tablet (8 mg total) by mouth 2 (two) times daily as needed. Start on the third day after chemotherapy. 03/30/14   Rulon Eisenmenger, MD  UNABLE TO FIND Apply 1 each topically as needed. 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  ICD-10 C50.411 03/30/14   Rulon Eisenmenger, MD    Review of Systems:  Constitutional:  No  weight loss, night sweats Head&Eyes: No headache.  No vision loss.  No eye pain or scotoma ENT:  No Difficulty swallowing,Tooth/dental problems,Sore throat,   Cardio-vascular:  No chest pain, Orthopnea, PND, swelling in lower extremities,  dizziness, palpitations  GI:  No  abdominal pain,vomiting, diarrhea, loss of appetite, hematochezia, melena, heartburn, indigestion, Resp:  No shortness of breath with exertion or at rest. No cough. No coughing up of blood .No wheezing.No chest wall deformity  Skin:  no rash or lesions.  GU:  no dysuria, change in color of urine, no urgency or frequency. No flank pain.  Musculoskeletal:  No joint pain or swelling. No decreased range of motion. No back pain.  Psych:  No change in mood or affect.  Neurologic: No headache, no dysesthesia, no focal weakness, no vision loss. No syncope  Physical Exam: Filed Vitals:   05/17/14 2142 05/17/14 2145 05/17/14 2200 05/17/14 2231  BP:  75/57 94/52 123/80    Pulse:  100 95 94  Temp: 101.1 F (38.4 C)     TempSrc: Rectal     Resp:  15 18 17   Height:      Weight:      SpO2:  98% 99% 96%   General:  A&O x 3, NAD, nontoxic, pleasant/cooperative Head/Eye: No conjunctival hemorrhage, no icterus, [place]/AT, No nystagmus ENT:  No icterus,  No thrush, gums without any erythema, drainage, necrotic tissue, no pharyngeal exudate Neck:  No masses, no lymphadenpathy, no bruits CV:  RRR, no rub, no gallop, no S3 Lung:  Basilar crackles. No wheezing. Good air movement Abdomen: soft/NT, +BS, nondistended, no peritoneal signs Ext: No cyanosis, No rashes, No petechiae, No lymphangitis, trace edema LLE edema   Labs on Admission:  Basic Metabolic Panel:  Recent Labs Lab 05/11/14 1334 05/17/14 2054  NA 140 132*  K 3.3* 3.4*  CL  --  101  CO2 26 25  GLUCOSE 115 188*  BUN 6.1* 15  CREATININE 1.0 0.82  CALCIUM 8.9 7.8*   Liver Function Tests:  Recent Labs Lab 05/11/14 1334 05/17/14 2054  AST 23 18  ALT 33 20  ALKPHOS 95 75  BILITOT 0.34 1.0  PROT 6.3* 5.5*  ALBUMIN 2.6* 2.8*   No results for input(s): LIPASE, AMYLASE in the last 168 hours. No results for input(s): AMMONIA in the last 168 hours. CBC:  Recent Labs Lab 05/11/14 1333 05/17/14 2054  WBC 10.6* 0.1*  NEUTROABS 9.0*  --   HGB 9.3* 8.7*  HCT 27.9* 26.4*  MCV 78.4* 79.3  PLT 273 122*   Cardiac Enzymes: No results for input(s): CKTOTAL, CKMB, CKMBINDEX, TROPONINI in the last 168 hours. BNP: Invalid input(s): POCBNP CBG: No results for input(s): GLUCAP in the last 168 hours.  Radiological Exams on Admission: Dg Chest Port 1 View  05/17/2014   CLINICAL DATA:  Acute onset of vomiting, nausea and diarrhea. Fever. Initial encounter.  EXAM: PORTABLE CHEST - 1 VIEW  COMPARISON:  Chest radiograph performed 02/27/2014  FINDINGS: The lungs are well-aerated. Mild vascular congestion is noted. Mild right basilar opacity may reflect atelectasis or possibly pneumonia. There is no  evidence of pleural effusion or pneumothorax.  The cardiomediastinal silhouette is within normal limits. No acute osseous abnormalities are seen. The right-sided chest port is noted ending about the mid SVC. Scattered clips are seen overlying the right breast.  IMPRESSION: Mild vascular congestion noted; mild right basilar airspace opacity may reflect atelectasis or possibly pneumonia.   Electronically Signed   By: Jacqulynn Cadet  Chang M.D.   On: 05/17/2014 21:12        Time spent:60 minutes Code Status:   FULL Family Communication:   Son updated at bedside   Arely Tinner, DO  [facility] Pager [phone]  If 7PM-7AM, please contact night-coverage [email or web address] Password [email or web address] 05/17/2014, 11:19 PM

## 2014-05-18 ENCOUNTER — Ambulatory Visit: Payer: Medicare Other | Admitting: Hematology and Oncology

## 2014-05-18 ENCOUNTER — Other Ambulatory Visit: Payer: Medicare Other

## 2014-05-18 DIAGNOSIS — J189 Pneumonia, unspecified organism: Secondary | ICD-10-CM

## 2014-05-18 LAB — CBC WITH DIFFERENTIAL/PLATELET
BASOS ABS: 0 10*3/uL (ref 0.0–0.1)
Basophils Relative: 0 % (ref 0–1)
Eosinophils Absolute: 0 10*3/uL (ref 0.0–0.7)
Eosinophils Relative: 0 % (ref 0–5)
HEMATOCRIT: 21.3 % — AB (ref 36.0–46.0)
Hemoglobin: 7.2 g/dL — ABNORMAL LOW (ref 12.0–15.0)
LYMPHS ABS: 0.1 10*3/uL — AB (ref 0.7–4.0)
Lymphocytes Relative: 83 % — ABNORMAL HIGH (ref 12–46)
MCH: 26.5 pg (ref 26.0–34.0)
MCHC: 33.8 g/dL (ref 30.0–36.0)
MCV: 78.3 fL (ref 78.0–100.0)
MONOS PCT: 8 % (ref 3–12)
Monocytes Absolute: 0 10*3/uL — ABNORMAL LOW (ref 0.1–1.0)
Neutro Abs: 0 10*3/uL — ABNORMAL LOW (ref 1.7–7.7)
Neutrophils Relative %: 9 % — ABNORMAL LOW (ref 43–77)
Platelets: 106 10*3/uL — ABNORMAL LOW (ref 150–400)
RBC: 2.72 MIL/uL — ABNORMAL LOW (ref 3.87–5.11)
RDW: 15 % (ref 11.5–15.5)
WBC: 0.1 10*3/uL — CL (ref 4.0–10.5)

## 2014-05-18 LAB — INFLUENZA PANEL BY PCR (TYPE A & B)
H1N1 flu by pcr: NOT DETECTED
INFLAPCR: NEGATIVE
INFLBPCR: NEGATIVE

## 2014-05-18 LAB — URINALYSIS, ROUTINE W REFLEX MICROSCOPIC
BILIRUBIN URINE: NEGATIVE
Glucose, UA: NEGATIVE mg/dL
Hgb urine dipstick: NEGATIVE
Ketones, ur: NEGATIVE mg/dL
Leukocytes, UA: NEGATIVE
Nitrite: NEGATIVE
Protein, ur: NEGATIVE mg/dL
Specific Gravity, Urine: 1.011 (ref 1.005–1.030)
Urobilinogen, UA: 0.2 mg/dL (ref 0.0–1.0)
pH: 6 (ref 5.0–8.0)

## 2014-05-18 LAB — BASIC METABOLIC PANEL
Anion gap: 6 (ref 5–15)
BUN: 13 mg/dL (ref 6–23)
CHLORIDE: 104 meq/L (ref 96–112)
CO2: 28 mmol/L (ref 19–32)
Calcium: 7.6 mg/dL — ABNORMAL LOW (ref 8.4–10.5)
Creatinine, Ser: 0.84 mg/dL (ref 0.50–1.10)
GFR calc Af Amer: 81 mL/min — ABNORMAL LOW (ref >90)
GFR calc non Af Amer: 70 mL/min — ABNORMAL LOW (ref >90)
GLUCOSE: 144 mg/dL — AB (ref 70–99)
Potassium: 3.8 mmol/L (ref 3.5–5.1)
SODIUM: 138 mmol/L (ref 135–145)

## 2014-05-18 LAB — MAGNESIUM: Magnesium: 1.5 mg/dL (ref 1.5–2.5)

## 2014-05-18 LAB — CG4 I-STAT (LACTIC ACID): LACTIC ACID, VENOUS: 1.41 mmol/L (ref 0.5–2.2)

## 2014-05-18 LAB — MRSA PCR SCREENING: MRSA BY PCR: NEGATIVE

## 2014-05-18 MED ORDER — SODIUM CHLORIDE 0.9 % IV SOLN
INTRAVENOUS | Status: DC
  Administered 2014-05-18 – 2014-05-21 (×5): via INTRAVENOUS
  Filled 2014-05-18 (×10): qty 1000

## 2014-05-18 MED ORDER — CEFEPIME HCL 2 G IJ SOLR
2.0000 g | Freq: Three times a day (TID) | INTRAMUSCULAR | Status: DC
  Administered 2014-05-18 – 2014-05-19 (×4): 2 g via INTRAVENOUS
  Filled 2014-05-18 (×5): qty 2

## 2014-05-18 MED ORDER — ACETAMINOPHEN 650 MG RE SUPP: 650.0000 mg | Freq: Four times a day (QID) | RECTAL | Status: DC | PRN

## 2014-05-18 MED ORDER — ONDANSETRON HCL 4 MG/2ML IJ SOLN
4.0000 mg | Freq: Four times a day (QID) | INTRAMUSCULAR | Status: DC | PRN
  Administered 2014-05-18 – 2014-05-24 (×6): 4 mg via INTRAVENOUS
  Filled 2014-05-18 (×7): qty 2

## 2014-05-18 MED ORDER — ONDANSETRON HCL 4 MG PO TABS: 4.0000 mg | ORAL_TABLET | Freq: Four times a day (QID) | ORAL | Status: DC | PRN

## 2014-05-18 MED ORDER — ALUM & MAG HYDROXIDE-SIMETH 200-200-20 MG/5ML PO SUSP
15.0000 mL | ORAL | Status: DC | PRN
  Administered 2014-05-18 – 2014-05-19 (×3): 15 mL via ORAL
  Filled 2014-05-18 (×3): qty 30

## 2014-05-18 MED ORDER — FAMOTIDINE 20 MG PO TABS
20.0000 mg | ORAL_TABLET | Freq: Two times a day (BID) | ORAL | Status: DC
  Administered 2014-05-18 – 2014-05-25 (×15): 20 mg via ORAL
  Filled 2014-05-18 (×18): qty 1

## 2014-05-18 MED ORDER — DIPHENHYDRAMINE HCL 25 MG PO CAPS
25.0000 mg | ORAL_CAPSULE | Freq: Two times a day (BID) | ORAL | Status: DC
  Administered 2014-05-18 – 2014-05-21 (×7): 25 mg via ORAL
  Filled 2014-05-18 (×9): qty 1

## 2014-05-18 MED ORDER — FLUOXETINE HCL 10 MG PO CAPS
10.0000 mg | ORAL_CAPSULE | Freq: Every day | ORAL | Status: DC
  Administered 2014-05-18 – 2014-05-26 (×9): 10 mg via ORAL
  Filled 2014-05-18 (×9): qty 1

## 2014-05-18 MED ORDER — SODIUM CHLORIDE 0.9 % IJ SOLN: 3.0000 mL | Freq: Two times a day (BID) | INTRAMUSCULAR | Status: DC

## 2014-05-18 MED ORDER — ACETAMINOPHEN 325 MG PO TABS
650.0000 mg | ORAL_TABLET | Freq: Four times a day (QID) | ORAL | Status: DC | PRN
  Administered 2014-05-19: 650 mg via ORAL
  Filled 2014-05-18: qty 2

## 2014-05-18 MED ORDER — LORAZEPAM 0.5 MG PO TABS
0.5000 mg | ORAL_TABLET | Freq: Three times a day (TID) | ORAL | Status: DC | PRN
  Administered 2014-05-18 – 2014-05-26 (×10): 0.5 mg via ORAL
  Filled 2014-05-18 (×10): qty 1

## 2014-05-18 MED ORDER — ENOXAPARIN SODIUM 40 MG/0.4ML ~~LOC~~ SOLN: 40.0000 mg | SUBCUTANEOUS | Status: DC

## 2014-05-18 NOTE — Progress Notes (Signed)
Utilization review completed.  

## 2014-05-18 NOTE — Progress Notes (Signed)
INITIAL NUTRITION ASSESSMENT  DOCUMENTATION CODES Per approved criteria  -Not Applicable   INTERVENTION: -Recommend pt incorporate use of Carnation Instant Breakfast with meals  -Reviewed nutrition therapy for nausea; encouraged use of dry starchy snacks and gingerale as warranted -RD to continue to monitor   NUTRITION DIAGNOSIS: Inadequate oral intake related to nausea/taste changes/loose stools as evidenced by PO intake < 75%.   Goal: Pt to meet >/= 90% of their estimated nutrition needs    Monitor:  Total protein/energy intake, labs, weights, GI profile, supplement tolerance  Reason for Assessment: Consult to Assess  69 y.o. 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  -Appetite varies; eats very well certain days with assistance from steroids post chemo treatments, and will eat very poorly other days d/t nausea -Has tried supplementing with Ensure and Boost; however cannot tolerate either brand as they exacerbate nausea -Pt endorsed unintentional wt loss of 5 lbs in past 2 months (2.4% body weight loss, non-significant for time frame) -Discussed importance of nutrition during illness; encouraged use of Carnation Instant Breakfast to supplement with meals as pt may tolerate it over Ensure/Boost -Current PO intake minimal, consuming gingerale and snacking on crackers and peanut butter at bedside but no intake of whole meal. -Reviewed suggestions for taste changes -Loose stools improving during admit; C.diff results pending  Height: Ht Readings from Last 1 Encounters:  05/18/14 5\' 3"  (1.6 m)    Weight: Wt Readings from Last 1 Encounters:  05/18/14  202 lb 9.6 oz (91.9 kg)    Ideal Body Weight: 115 lb  % Ideal Body Weight: 176%  Wt Readings from Last 10 Encounters:  05/18/14 202 lb 9.6 oz (91.9 kg)  05/11/14 195 lb 9.6 oz (88.724 kg)  05/06/14 192 lb 14.4 oz (87.499 kg)  04/27/14 198 lb 12.8 oz (90.175 kg)  04/20/14 202 lb 8 oz (91.853 kg)  04/13/14 207 lb 9.6 oz (94.167 kg)  04/06/14 203 lb 14.4 oz (92.488 kg)  03/30/14 205 lb 1.6 oz (93.033 kg)  03/12/14 205 lb 9 oz (93.243 kg)  02/27/14 207 lb (93.895 kg)    Usual Body Weight: ~207 lb   % Usual Body Weight: 97.5%  BMI:  Body mass index is 35.9 kg/(m^2).  Estimated Nutritional Needs: Kcal: 1800-2000 Protein: 90-100 gram Fluid: >/=2000 ml daily  Skin: WDL  Diet Order: Diet regular  EDUCATION NEEDS: -No education needs identified at this time   Intake/Output Summary (Last 24 hours) at 05/18/14 1512 Last data filed at 05/18/14 1459  Gross per 24 hour  Intake 1718.33 ml  Output   1275 ml  Net 443.33 ml    Last BM: 1/03   Labs:   Recent Labs Lab 05/17/14 2054 05/18/14 0413  NA 132* 138  K 3.4* 3.8  CL 101 104  CO2 25 28  BUN 15 13  CREATININE 0.82 0.84  CALCIUM 7.8* 7.6*  MG  --  1.5  GLUCOSE 188* 144*    CBG (last 3)  No results for input(s): GLUCAP in the last 72 hours.  Scheduled Meds: . ceFEPime (MAXIPIME) IV  2 g Intravenous Q8H  . diphenhydrAMINE  25 mg Oral BID  . famotidine  20 mg Oral BID  . FLUoxetine  10 mg Oral Daily  . sodium chloride  3 mL Intravenous Q12H  . vancomycin  750 mg Intravenous Q12H    Continuous Infusions: . sodium chloride 0.9 % 1,000 mL with potassium chloride 20 mEq infusion 100 mL/hr at 05/18/14 1145    Past Medical History  Diagnosis Date  . Anxiety   . Back pain   . H/O bladder problems   . Cholelithiasis   . Depression   . High blood pressure   . Hypercholesteremia   . Kidney stones   . Gum disease   . Complication of anesthesia     bp goes up  . Wears glasses   . Wears dentures      top    Past Surgical History  Procedure Laterality Date  . Colon biopsy    . Mouth surgery    . Spine surgery    . Colonoscopy    . Tubal ligation    . Lithotripsy    . Rhinoplasty    . Back surgery  1989    lumb lam  . Breast biopsy Right 02/05/2014    invasive ductal cncer  . Abdominal hysterectomy  1982  . Cholecystectomy  2011    lap choli  . Radioactive seed guided mastectomy with axillary sentinel lymph node biopsy Right 02/27/2014    Procedure: RADIOACTIVE SEED GUIDED RIGHT PARTIAL MASTECTOMY WITH AXILLARY SENTINEL LYMPH NODE BIOPSY;  Surgeon: Stark Klein, MD;  Location: [facility];  Service: General;  Laterality: Right;  . Portacath placement N/A 02/27/2014    Procedure: INSERTION PORT-A-CATH;  Surgeon: Stark Klein, MD;  Location: [facility];  Service: General;  Laterality: N/A;    Atlee Abide MS RD LDN Clinical Dietitian [phone]

## 2014-05-18 NOTE — Progress Notes (Addendum)
Patient ID: Cheyenne Riggs, female   DOB: 1945-11-25, 69 y.o.   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 Patient received chemotherapy on 05/11/2014 with Neulasta support on 05/12/2014 under the care of Dr. Lindi Adie. Patient did not have complaints of chest pain, shortness of breath or cough. There was no complaint of abdominal pain, nausea or vomiting. Off note, patient had pain in the left lower gum area and has seen her dentist 05/12/2014. She was placed on Augmentin but because of perfuse diarrhea patient only took it for 2 days. Because of diarrhea patient was also placed on Flagyl 250 mg every 8 hours by her dentist. Diarrhea has improved since the admission.  In ED, patient was hypotensive with blood pressure of 75/57. Blood pressure has improved with 1 L normal saline infusion to 123/80. Patient was tachypneic, an tachycardic, oxygen saturation of 90% with nasal cannula oxygen support and had a fever off 103.80F. She was started on broad-spectrum antibiotics for treatment of sepsis and febrile neutropenia. Chest x-ray was concerning for possible pneumonia.   Assessment/Plan:    Principal problem Acute respiratory failure with hypoxia  Hypoxia likely secondary to possible pneumonia as seen on chest x-ray.  Patient was started on broad-spectrum antibiotics, vancomycin and Zosyn.  Follow-up blood culture results.  Respiratory status is stable at this time, oxygen saturation is 98%.  Active Problems: Sepsis secondary to possible pneumonia versus C. difficile colitis  Sepsis criteria met on the admission with vital signs that included  hypotension, tachycardia, tachypnea, slight hypoxia. In addition patient had fever. The white blood cell count on the admission was 0.1. Source of infection - pneumonia.  Please note that patient had complaints of diarrhea but this has improved since the admission. Stool was sent for C. difficile analysis.  Follow-up blood culture results  Febrile neutropenia  Likely secondary to sequela of recent chemotherapy.  Patient has received Neulasta support 05/12/2014.  Will inform patient's oncologist of patient's admission and see if they recommend Neupogen.  Continue current antibiotics, vancomycin and cefepime.  Follow-up blood culture results, stool for C. difficile  Breast cancer of upper-outer quadrant of right female breast  Patient has had recent chemotherapy 05/11/2014 with Neulasta support on 05/12/2014.  Will inform patient's oncologist of patient's admission.  Anemia of chronic disease  Likely anemia of malignancy and sequela of chemotherapy  Hemoglobin is 7.2 this morning. We'll continue to monitor CBC daily and transfuse if less than 7.  Pancytopenia / thrombocytopenia  Likely secondary to sequela of chemotherapy.  Continue to monitor CBC daily.  Monitor for bleeding.  Hypokalemia  Secondary to sepsis.  Supplemented.  Potassium is within normal limits this morning.   DVT Prophylaxis   Stop Lovenox and use SCDs for DVT prophylaxis because of thrombocytopenia and the risk of bleeding.  Code Status: Full.  Family Communication:  plan of care discussed with the patient Disposition Plan: Patient is stable for transfer to telemetry floor.  IV access:  Peripheral IV  Procedures and diagnostic studies:    Dg Chest Port 1 View June 15, 2014    Mild vascular congestion noted; mild right basilar airspace opacity may reflect atelectasis or possibly pneumonia.   Medical  Consultants:  Dr. Nicholas Lose, Oncology  Other Consultants:  Nutrition Physical therapy    IAnti-Infectives:   Vancomycin 05/18/2014 --> Cefepime  05/18/2014 -->   Leisa Lenz, MD  [facility] Pager [phone]  If 7PM-7AM, please contact night-coverage [email or web address] Password [facility] 05/18/2014, 8:53 AM   LOS: 1 day    HPI/Subjective: No acute overnight events.  Objective: Filed Vitals:   05/18/14 0400 05/18/14 0500 05/18/14 0600 05/18/14 0844  BP: 136/47 98/66 120/60   Pulse: 85 89 98   Temp: 98.4 F (36.9 C)   98.6 F (37 C)  TempSrc:    Oral  Resp: _0 Height:      Weight:      SpO2: 98% 99% 98%     Intake/Output Summary (Last 24 hours) at 05/18/14 0853 Last data filed at 05/18/14 0825  Gross per 24 hour  Intake   1190 ml  Output    725 ml  Net    465 ml    Exam:   General:  Pt is alert, follows commands appropriately, not in acute distress  Cardiovascular: Regular rate and rhythm, S1/S2 (+)  Respiratory: Diminished breath sounds, no wheezing  Abdomen: Soft, non tender, non distended, bowel sounds present  Extremities: Trace lower extremity edema, pulses DP and PT palpable bilaterally  Neuro: Grossly nonfocal  Data Reviewed: Basic Metabolic Panel:  Recent Labs Lab 05/11/14 1334 05/17/14 2054 05/18/14 0413  NA 140 132* 138  K 3.3* 3.4* 3.8  CL  --  101 104  CO2 _1 GLUCOSE 115 188* 144*  BUN 6.1* 15 13  CREATININE 1.0 0.82 0.84  CALCIUM 8.9 7.8* 7.6*  MG  --   --  1.5   Liver Function Tests:  Recent Labs Lab 05/11/14 1334 05/17/14 2054  AST 23 18  ALT 33 20  ALKPHOS 95 75  BILITOT 0.34 1.0  PROT 6.3* 5.5*  ALBUMIN 2.6* 2.8*   No results for input(s): LIPASE, AMYLASE in the last 168 hours. No results for input(s): AMMONIA in the last 168 hours. CBC:  Recent Labs Lab 05/11/14 1333 05/17/14 2054 05/18/14 0413  WBC 10.6* 0.1* 0.1*  NEUTROABS 9.0*  --  0.0*  HGB 9.3* 8.7* 7.2*  HCT 27.9* 26.4* 21.3*  MCV 78.4* 79.3 78.3  PLT 273 122* 106*   Cardiac Enzymes: No results for input(s):  CKTOTAL, CKMB, CKMBINDEX, TROPONINI in the last 168 hours. BNP: Invalid input(s): POCBNP CBG: No results for input(s): GLUCAP in the last 168 hours.  MRSA PCR Screening     Status: None   Collection Time: 05/18/14 12:44 AM  Result Value Ref Range Status   MRSA by PCR NEGATIVE NEGATIVE Final     Scheduled Meds: . ceFEPime (MAXIPIME)   2 g Intravenous Q8H  . diphenhydrAMINE  25 mg Oral BID  . enoxaparin (LOVENOX)   40 mg Subcutaneous Q24H  . famotidine  20 mg Oral BID  . FLUoxetine  10 mg Oral Daily  . vancomycin  750 mg Intravenous Q12H   Continuous Infusions: . sodium chloride 0.9 % 1,000 mL with potassium chloride 20 mEq infusion 100 mL/hr at 05/18/14 0119

## 2014-05-18 NOTE — Progress Notes (Signed)
ANTIBIOTIC CONSULT NOTE - INITIAL  Pharmacy Consult for:  Cefepime Indication:  Sepsis/Febrile Neutropenia  Allergies  Allergen Reactions  . Ciprofloxacin Nausea And Vomiting  . Demerol [Meperidine] Hives  . Lidocaine     palpatations  . Other Swelling    Eye ointments  . Septra [Sulfamethoxazole-Trimethoprim] Nausea And Vomiting  . Codeine Rash  . Novocain [Procaine] Palpitations    Patient Measurements: Height: 5\' 3"  (160 cm) Weight: 193 lb (87.544 kg) IBW/kg (Calculated) : 52.4   Vital Signs: Temp: 101.1 F (38.4 C) (01/03 2142) Temp Source: Rectal (01/03 2142) BP: 83/44 mmHg (01/04 0011) Pulse Rate: 82 (01/04 0011)  Labs:  Recent Labs  05/17/14 2054  WBC 0.1*  HGB 8.7*  PLT 122*  CREATININE 0.82   Estimated Creatinine Clearance: 68.8 mL/min (by C-G formula based on Cr of 0.82).   Microbiology: Blood and urine cultures are pending.  Medical History: Past Medical History  Diagnosis Date  . Anxiety   . Back pain   . H/O bladder problems   . Cholelithiasis   . Depression   . High blood pressure   . Hypercholesteremia   . Kidney stones   . Gum disease   . Complication of anesthesia     bp goes up  . Wears glasses   . Wears dentures     top     Assessment:  Pt known to pharmacy from earlier consult for vancomycin and zosyn  Upon admission, MD has requested pharmacy to continue dosing vancomycin and zosyn has been changed to Cefepime for febrile neutropenia  Goals of Therapy:   Eradication of infection  Plan:   Continue Vancomycin as ordered earlier  Cefepime 2gm IV q8h for febrile neutropenia  Leone Haven, PharmD  05/18/2014,12:59 AM

## 2014-05-19 ENCOUNTER — Other Ambulatory Visit: Payer: Self-pay | Admitting: Hematology and Oncology

## 2014-05-19 DIAGNOSIS — A047 Enterocolitis due to Clostridium difficile: Secondary | ICD-10-CM

## 2014-05-19 LAB — CBC
HCT: 21.3 % — ABNORMAL LOW (ref 36.0–46.0)
HEMOGLOBIN: 7.1 g/dL — AB (ref 12.0–15.0)
MCH: 26.3 pg (ref 26.0–34.0)
MCHC: 33.3 g/dL (ref 30.0–36.0)
MCV: 78.9 fL (ref 78.0–100.0)
Platelets: 67 10*3/uL — ABNORMAL LOW (ref 150–400)
RBC: 2.7 MIL/uL — AB (ref 3.87–5.11)
RDW: 15.2 % (ref 11.5–15.5)
WBC: 0.2 10*3/uL — AB (ref 4.0–10.5)

## 2014-05-19 LAB — BASIC METABOLIC PANEL
ANION GAP: 7 (ref 5–15)
BUN: 10 mg/dL (ref 6–23)
CALCIUM: 7.7 mg/dL — AB (ref 8.4–10.5)
CO2: 24 mmol/L (ref 19–32)
Chloride: 105 mEq/L (ref 96–112)
Creatinine, Ser: 0.99 mg/dL (ref 0.50–1.10)
GFR calc non Af Amer: 57 mL/min — ABNORMAL LOW (ref >90)
GFR, EST AFRICAN AMERICAN: 66 mL/min — AB (ref >90)
Glucose, Bld: 123 mg/dL — ABNORMAL HIGH (ref 70–99)
Potassium: 3.5 mmol/L (ref 3.5–5.1)
SODIUM: 136 mmol/L (ref 135–145)

## 2014-05-19 LAB — ABO/RH: ABO/RH(D): A POS

## 2014-05-19 LAB — CLOSTRIDIUM DIFFICILE BY PCR: CDIFFPCR: POSITIVE — AB

## 2014-05-19 LAB — PREPARE RBC (CROSSMATCH)

## 2014-05-19 LAB — URINE CULTURE
CULTURE: NO GROWTH
Colony Count: NO GROWTH

## 2014-05-19 MED ORDER — DEXTROSE 5 % IV SOLN
2.0000 g | Freq: Three times a day (TID) | INTRAVENOUS | Status: DC
  Administered 2014-05-19 – 2014-05-25 (×18): 2 g via INTRAVENOUS
  Filled 2014-05-19 (×19): qty 2

## 2014-05-19 MED ORDER — DEXTROSE 5 % IV SOLN
480.0000 ug | Freq: Once | INTRAVENOUS | Status: AC
  Administered 2014-05-19: 480 ug via INTRAVENOUS
  Filled 2014-05-19: qty 1.6

## 2014-05-19 MED ORDER — SODIUM CHLORIDE 0.9 % IV SOLN
Freq: Once | INTRAVENOUS | Status: AC
  Administered 2014-05-19: 14:00:00 via INTRAVENOUS

## 2014-05-19 MED ORDER — VANCOMYCIN HCL IN DEXTROSE 750-5 MG/150ML-% IV SOLN
750.0000 mg | Freq: Two times a day (BID) | INTRAVENOUS | Status: DC
  Administered 2014-05-20 (×2): 750 mg via INTRAVENOUS
  Filled 2014-05-19 (×2): qty 150

## 2014-05-19 MED ORDER — SODIUM CHLORIDE 0.9 % IJ SOLN
10.0000 mL | INTRAMUSCULAR | Status: DC | PRN
  Administered 2014-05-20: 10 mL
  Administered 2014-05-20: 20 mL
  Administered 2014-05-20 – 2014-05-25 (×4): 10 mL
  Filled 2014-05-19 (×6): qty 40

## 2014-05-19 MED ORDER — METRONIDAZOLE IN NACL 5-0.79 MG/ML-% IV SOLN
500.0000 mg | Freq: Three times a day (TID) | INTRAVENOUS | Status: DC
  Administered 2014-05-19 – 2014-05-21 (×7): 500 mg via INTRAVENOUS
  Filled 2014-05-19 (×8): qty 100

## 2014-05-19 NOTE — Progress Notes (Signed)
Clinical Social Work Department BRIEF PSYCHOSOCIAL ASSESSMENT 05/19/2014  Patient:  Cheyenne Riggs, Cheyenne Riggs     Account Number:  [id number]     Admit date:  05/17/2014  Clinical Social Worker:  Renold Genta  Date/Time:  05/19/2014 04:16 PM  Referred by:  Physician  Date Referred:  05/19/2014 Referred for  SNF Placement   Other Referral:   Interview type:  Patient Other interview type:   and son, Cheyenne Riggs at bedside    PSYCHOSOCIAL DATA Living Status:  ALONE Admitted from facility:   Level of care:   Primary support name:  Cheyenne Riggs (son) c#: [phone] h#: [phone] Primary support relationship to patient:  CHILD, ADULT Degree of support available:   good    CURRENT CONCERNS Current Concerns  Post-Acute Placement   Other Concerns:    SOCIAL WORK ASSESSMENT / PLAN CSW received consult for SNF placement.   Assessment/plan status:  Information/Referral to [employer] Other assessment/ plan:   Information/referral to community resources:   CSW completed FL2 and faxed information out to [facility] - provided bed offers to patient & son at bedside.    PATIENT'S/FAMILY'S RESPONSE TO PLAN OF CARE: Patient informed CSW that she lived alone prior to hospitalization & son lives near [place]. Patient is considering SNF at discharge, though states that she thinks that once she gets a blood transfusion and her WBC improves that she'll be able to function better and go home. CSW will continue to check with patient re: discharge planning.          Raynaldo Opitz, [facility] Clinical Social Worker cell #: [phone]

## 2014-05-19 NOTE — Progress Notes (Signed)
Clinical Social Work Department CLINICAL SOCIAL WORK PLACEMENT NOTE 05/19/2014  Patient:  Cheyenne Riggs, Cheyenne Riggs  Account Number:  [id number] Admit date:  05/17/2014  Clinical Social Worker:  Renold Genta  Date/time:  05/19/2014 04:38 PM  Clinical Social Work is seeking post-discharge placement for this patient at the following level of care:   SKILLED NURSING   (*CSW will update this form in Epic as items are completed)   05/19/2014  Patient/family provided with [place] Department of Clinical Social Work's list of facilities offering this level of care within the geographic area requested by the patient (or if unable, by the patient's family).  05/19/2014  Patient/family informed of their freedom to choose among providers that offer the needed level of care, that participate in Medicare, Medicaid or managed care program needed by the patient, have an available bed and are willing to accept the patient.  05/19/2014  Patient/family informed of [facility]' ownership interest in [facility], as well as of the fact that they are under no obligation to receive care at this facility.  PASARR submitted to EDS on 05/19/2014 PASARR number received on 05/19/2014  FL2 transmitted to all facilities in geographic area requested by pt/family on  05/19/2014 FL2 transmitted to all facilities within larger geographic area on   Patient informed that his/her managed care company has contracts with or will negotiate with  certain facilities, including the following:     Patient/family informed of bed offers received:  05/19/2014 Patient chooses bed at  Physician recommends and patient chooses bed at    Patient to be transferred to  on   Patient to be transferred to facility by  Patient and family notified of transfer on  Name of family member notified:    The following physician request were entered in Epic:   Additional Comments:    Raynaldo Opitz, [place] Social Worker cell #: [phone]

## 2014-05-19 NOTE — Progress Notes (Signed)
Patient ID: Cheyenne Riggs, female   DOB: 12/06/1945, 69 y.o.   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 Patient received chemotherapy on 05/11/2014 with Neulasta support on 05/12/2014 under the care of Dr. Lindi Adie. Patient did not have complaints of chest pain, shortness of breath or cough. There was no complaint of abdominal pain, nausea or vomiting. Off note, patient had pain in the left lower gum area and has seen her dentist 05/12/2014. She was placed on Augmentin but because of perfuse diarrhea patient only took it for 2 days. Because of diarrhea patient was also placed on Flagyl 250 mg every 8 hours by her dentist. Diarrhea has improved since the admission.  In ED, patient was hypotensive with blood pressure of 75/57. Blood pressure has improved with 1 L normal saline infusion to 123/80. Patient was tachypneic, an tachycardic, oxygen saturation of 90% with nasal cannula oxygen support and had a fever off 103.59F. She was started on broad-spectrum antibiotics for treatment of sepsis and febrile neutropenia. Chest x-ray was concerning for possible pneumonia.  Hospital course complicated with additional findings of C. difficile enteritis. Patient was started on Flagyl 05/19/2014.  Assessment/Plan:    Principal problem Acute respiratory failure with hypoxia / possible pneumonia  Hypoxia likely secondary to possible pneumonia as seen on chest x-ray.  Patient was started on broad-spectrum antibiotics, vancomycin and cefepime.  Please note that patient also has C. difficile and we started Flagyl as well.  Blood culture results are still  pending.  Respiratory status is stable at this time, oxygen saturation is 98%.  Active Problems: Sepsis secondary to possible pneumonia and C. difficile colitis  Sepsis criteria met on the admission with vital signs that included hypotension, tachycardia, tachypnea, slight hypoxia. In addition patient had fever. The white blood cell count on the admission was 0.1. Source of infection - pneumonia.  Please note that patient had complaints of diarrhea but this has improved since the admission. Stool was sent for C. difficile analysis and it was positive. He was started Flagyl 05/19/2014.  Febrile neutropenia  Likely secondary to sequela of recent chemotherapy.  Patient has received Neulasta support 05/12/2014.  Since white blood cell count still low at 0.2 we will give 1 dose of Neupogen today.  Patient's oncologist was informed of patient's admission.  Continue current antibiotic regimen, vancomycin and cefepime for possible pneumonia and Flagyl for C. difficile.  Breast cancer of upper-outer quadrant of right female breast  Patient has had recent chemotherapy 05/11/2014 with Neulasta support on 05/12/2014.  Anemia of chronic disease  Likely anemia of malignancy and sequela of chemotherapy  Hemoglobin is 7.1 this morning. We will transfuse 1 unit of PRBC today.  Pancytopenia / thrombocytopenia  Likely secondary to sequela of chemotherapy.  Platelet count is 61 this morning.  Continue to monitor CBC daily.  Monitor for bleeding.  Hypokalemia  Secondary to sepsis.  Supplemented.  Potassium is within normal limits this morning.   DVT Prophylaxis   Stopped Lovenox and use SCDs for DVT prophylaxis because of thrombocytopenia and the risk of bleeding.  Code Status: Full.  Family Communication:  plan of care discussed with the patient Disposition Plan: Home when  stable  IV access:  Peripheral IV  Procedures and diagnostic studies:    Dg Chest Port 1 View  05/17/2014    Mild vascular congestion noted; mild right basilar airspace opacity may reflect atelectasis or possibly pneumonia.   Medical Consultants:  Dr. Nicholas Lose, Oncology - informed of patient's admission   Other Consultants:  Nutrition Physical therapy   IAnti-Infectives:   Vancomycin 05/18/2014 -->  Cefepime 05/18/2014 --> Flagyl 05/19/2014 -->     Leisa Lenz, MD  [facility] Pager [phone]  If 7PM-7AM, please contact night-coverage [email or web address] Password [email or web address] 05/19/2014, 9:58 AM   LOS: 2 days    HPI/Subjective: No acute overnight events.  Objective: Filed Vitals:   05/18/14 1000 05/18/14 1148 05/18/14 2202 05/19/14 0416  BP: 97/65 124/59 114/74 122/57  Pulse: 104 102 108 102  Temp:  98.1 F (36.7 C) 99 F (37.2 C) 99.5 F (37.5 C)  TempSrc:  Oral Oral Oral  Resp: $Remo'25 20 20 20  'hrqWE$ Height:      Weight:      SpO2: 91% 98% 97% 94%    Intake/Output Summary (Last 24 hours) at 05/19/14 0958 Last data filed at 05/19/14 0600  Gross per 24 hour  Intake 3318.33 ml  Output   1250 ml  Net 2068.33 ml    Exam:   General:  Pt is alert, follows commands appropriately, not in acute distress  Cardiovascular: Regular rate and rhythm, S1/S2 (+0  Respiratory: Clear to auscultation bilaterally, no wheezing, no crackles, no rhonchi  Abdomen: Soft, non tender, non distended, bowel sounds present  Extremities: trace LE edema, pulses DP and PT palpable bilaterally  Neuro: Grossly nonfocal  Data Reviewed: Basic Metabolic Panel:  Recent Labs Lab 05/17/14 2054 05/18/14 0413 05/19/14 0415  NA 132* 138 136  K 3.4* 3.8 3.5  CL 101 104 105  CO2 $Re'25 28 24  'HWl$ GLUCOSE 188* 144* 123*  BUN $Re'15 13 10  'aoP$ CREATININE 0.82 0.84 0.99  CALCIUM 7.8* 7.6* 7.7*  MG  --  1.5  --    Liver Function Tests:  Recent Labs Lab 05/17/14 2054  AST 18  ALT 20  ALKPHOS 75  BILITOT 1.0  PROT 5.5*  ALBUMIN 2.8*   No results for input(s): LIPASE, AMYLASE in the last 168  hours. No results for input(s): AMMONIA in the last 168 hours. CBC:  Recent Labs Lab 05/17/14 2054 05/18/14 0413 05/19/14 0415  WBC 0.1* 0.1* 0.2*  NEUTROABS  --  0.0*  --   HGB 8.7* 7.2* 7.1*  HCT 26.4* 21.3* 21.3*  MCV 79.3 78.3 78.9  PLT 122* 106* 67*   Cardiac Enzymes: No results for input(s): CKTOTAL, CKMB, CKMBINDEX, TROPONINI in the last 168 hours. BNP: Invalid input(s): POCBNP CBG: No results for input(s): GLUCAP in the last 168 hours.  Recent Results (from the past 240 hour(s))  MRSA PCR Screening     Status: None   Collection Time: 05/18/14 12:44 AM  Result Value Ref Range Status   MRSA by PCR NEGATIVE NEGATIVE Final  Urine culture     Status: None   Collection Time: 05/18/14  1:11 AM  Result Value Ref Range Status   Specimen Description URINE, CLEAN CATCH  Final   Special Requests NONE  Final   Colony Count NO GROWTH   Final   Report Status 05/19/2014 FINAL  Final  Clostridium Difficile by PCR     Status: Abnormal   Collection Time: 05/18/14  3:42 PM  Result Value Ref Range Status  C difficile by pcr POSITIVE (A) NEGATIVE Final     Scheduled Meds: . sodium chloride   Intravenous Once  . ceFEPime (MAXIPIME)   2 g Intravenous Q8H  . diphenhydrAMINE  25 mg Oral BID  . famotidine  20 mg Oral BID  . filgrastim (NEUPOGEN)   480 mcg Intravenous ONCE-1800  . FLUoxetine  10 mg Oral Daily  . metronidazole  500 mg Intravenous Q8H  . sodium chloride  3 mL Intravenous Q12H  . vancomycin  750 mg Intravenous Q12H   Continuous Infusions: . sodium chloride 0.9 % 1,000 mL with potassium chloride 20 mEq infusion 100 mL/hr at 05/19/14 0217

## 2014-05-19 NOTE — Evaluation (Signed)
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 The patient received chemotherapy on 05/11/2014. She received Neulasta on 05/12/2014 under guidance of Dr. Lindi Adie. On the morning of admission, the patient began experiencing fevers and chills that prompted her to come to the emergency department. The patient denies any headache, chest pain, shortness breath, coughing, hemoptysis, vomiting, abdominal pain, dysuria, hematuria. Notably, the patient began noticing some pain in her left lower gums. She went to see her dentist on 05/12/2014. The patient was placed on Augmentin. She only took it 2 days because of diarrhea and nausea. She states that even with the 2 days of antibiotics for gum swelling has improved. Because of her diarrhea, the patient was also placed on Flagyl 250 mg every 8 hours by her dentist. Again, she only took this 2 days because it caused nausea.  Clinical Impression  Pt presents with decreased mobility, decreased strength and activity tolerance.  Note that she continues to have diarrhea but states it is improving.  Also note that she is to get single unit of PRBC today as HGB is currently 7.1.  Requires min A (HHA) to and from restroom and standing balance.  Feel she will likely do somewhat better with unit of blood.  Son present at end of session to discuss D/C plan.  Feel that she needs 24/7 care more from medical standpoint, but would also benefit from continued strengthening and balance training for safety at home.  Discussed D/C to SNF for short time then transitioning to ALF.  Note that she wants to eventually return to son's home, but wants to complete chemo treatment with MD's  here.  Discussed with CSW and she is to provide options.  Pt will benefit from acute services to continue to address deficits.      Follow Up Recommendations SNF;Supervision/Assistance - 24 hour    Equipment Recommendations  Rolling walker with 5" wheels    Recommendations for Other Services       Precautions / Restrictions Precautions Precautions: Fall Precaution Comments: neutropenic precautions, enteric Restrictions Weight Bearing Restrictions: No      Mobility  Bed Mobility Overal bed mobility: Needs Assistance Bed Mobility: Supine to Sit     Supine to sit: HOB elevated;Min assist     General bed mobility comments: Pt requires min A to elevate trunk into sitting with HOB elevated and cues for utilization of bed rails.    Transfers Overall transfer level: Needs assistance Equipment used: None Transfers: Sit to/from Stand Sit to Stand: Min assist         General transfer comment: Pt requires steadying assist to rise with cues for hand placement and safety as she is somewhat impulsive due to needing to use restroom.   Ambulation/Gait Ambulation/Gait assistance: Min assist Ambulation Distance (Feet): 15 Feet (x 2) Assistive device: 1 person hand held assist Gait Pattern/deviations: Step-through pattern;Decreased stride length;Trunk flexed;Wide base of support     General Gait Details: Pt needing to use restroom, therefore ambulated to/from restroom.  Pt incontinent of loose stools, therefore assisted with peri care and donning new socks, underwear.  Pt with c/o fatigue and inability to ambulate any more in hallway, but did wish to return to chair. Note that she tends to furniture walk  in room and then states that she "wall walked" at home.  Discussed use of RW for increased safety.    Stairs            Wheelchair Mobility    Modified Rankin (Stroke Patients Only)       Balance Overall balance assessment: Needs assistance Sitting-balance support: Feet  supported Sitting balance-Leahy Scale: Good     Standing balance support: During functional activity;Single extremity supported Standing balance-Leahy Scale: Fair Standing balance comment: pt requires HHA to maintain balance during dynamic standing activities.                              Pertinent Vitals/Pain Pain Assessment: No/denies pain    Home Living Family/patient expects to be discharged to:: Private residence Living Arrangements: Alone   Type of Home: House Home Access: Stairs to enter Entrance Stairs-Rails: None Entrance Stairs-Number of Steps: 3 Home Layout: Two level;Able to live on main level with bedroom/bathroom Home Equipment: Kasandra Knudsen - single point Additional Comments: Pt states she does well unless she is currently doing chemo and then she does very little around home and son ensures that her fridge is stocked, however he lives in [place] and is unable to stay with her.     Prior Function Level of Independence: Independent with assistive device(s);Independent         Comments: only using cane intermittently     Hand Dominance        Extremity/Trunk Assessment               Lower Extremity Assessment: Generalized weakness      Cervical / Trunk Assessment: Kyphotic  Communication      Cognition Arousal/Alertness: Awake/alert Behavior During Therapy: WFL for tasks assessed/performed Overall Cognitive Status: Within Functional Limits for tasks assessed                      General Comments      Exercises        Assessment/Plan    PT Assessment Patient needs continued PT services  PT Diagnosis Difficulty walking;Generalized weakness   PT Problem List Decreased strength;Decreased activity tolerance;Decreased balance;Decreased mobility;Decreased knowledge of use of DME;Decreased knowledge of precautions;Cardiopulmonary status limiting activity  PT Treatment Interventions DME instruction;Gait training;Stair  training;Functional mobility training;Therapeutic activities;Therapeutic exercise;Balance training;Patient/family education   PT Goals (Current goals can be found in the Care Plan section) Acute Rehab PT Goals Patient Stated Goal: to return home with son when chemo is finished.  PT Goal Formulation: With patient/family Time For Goal Achievement: 05/26/14 Potential to Achieve Goals: Good    Frequency Min 3X/week   Barriers to discharge Decreased caregiver support      Co-evaluation               End of Session   Activity Tolerance: Patient limited by fatigue Patient left: in chair;with call bell/phone within reach;with family/visitor present Nurse Communication: Mobility status         Time: 5329-9242 PT Time Calculation (min) (ACUTE ONLY): 29 min   Charges:   PT Evaluation $Initial PT Evaluation Tier I: 1 Procedure PT Treatments $Therapeutic Activity: 23-37 mins   PT G Codes:        Denice Bors 05/19/2014, 11:53 AM

## 2014-05-20 ENCOUNTER — Telehealth: Payer: Self-pay | Admitting: Hematology and Oncology

## 2014-05-20 LAB — BASIC METABOLIC PANEL
ANION GAP: 7 (ref 5–15)
BUN: 12 mg/dL (ref 6–23)
CALCIUM: 7.9 mg/dL — AB (ref 8.4–10.5)
CO2: 25 mmol/L (ref 19–32)
CREATININE: 0.95 mg/dL (ref 0.50–1.10)
Chloride: 107 mEq/L (ref 96–112)
GFR, EST AFRICAN AMERICAN: 70 mL/min — AB (ref >90)
GFR, EST NON AFRICAN AMERICAN: 60 mL/min — AB (ref >90)
Glucose, Bld: 97 mg/dL (ref 70–99)
Potassium: 3.7 mmol/L (ref 3.5–5.1)
SODIUM: 139 mmol/L (ref 135–145)

## 2014-05-20 LAB — HEMOGLOBIN AND HEMATOCRIT, BLOOD
HCT: 22.1 % — ABNORMAL LOW (ref 36.0–46.0)
Hemoglobin: 7.4 g/dL — ABNORMAL LOW (ref 12.0–15.0)

## 2014-05-20 LAB — TYPE AND SCREEN
ABO/RH(D): A POS
Antibody Screen: NEGATIVE
Unit division: 0

## 2014-05-20 LAB — VANCOMYCIN, TROUGH: VANCOMYCIN TR: 16.5 ug/mL (ref 10.0–20.0)

## 2014-05-20 MED ORDER — SACCHAROMYCES BOULARDII 250 MG PO CAPS
250.0000 mg | ORAL_CAPSULE | Freq: Two times a day (BID) | ORAL | Status: DC
  Administered 2014-05-20 – 2014-05-26 (×12): 250 mg via ORAL
  Filled 2014-05-20 (×13): qty 1

## 2014-05-20 MED ORDER — LORATADINE 10 MG PO TABS
10.0000 mg | ORAL_TABLET | Freq: Every day | ORAL | Status: DC
  Administered 2014-05-22: 10 mg via ORAL
  Filled 2014-05-20 (×7): qty 1

## 2014-05-20 MED ORDER — VANCOMYCIN HCL IN DEXTROSE 1-5 GM/200ML-% IV SOLN
1000.0000 mg | Freq: Two times a day (BID) | INTRAVENOUS | Status: DC
  Administered 2014-05-20 – 2014-05-21 (×3): 1000 mg via INTRAVENOUS
  Filled 2014-05-20 (×4): qty 200

## 2014-05-20 NOTE — Progress Notes (Signed)
Assumed care of patient at 1600. No changes from previous RN's assessment. Patient asked for medication for congestion and for a probiotic to be ordered. Dr. Karleen Hampshire made aware. Will continue to monitor patient.

## 2014-05-20 NOTE — Progress Notes (Addendum)
ANTIBIOTIC CONSULT NOTE - Follow-Up  Pharmacy Consult for:  Vancomycin, Cefepime Indication:  Sepsis, Febrile Neutropenia  Allergies  Allergen Reactions  . Ciprofloxacin Nausea And Vomiting  . Demerol [Meperidine] Hives  . Lidocaine     palpatations  . Other Swelling    Eye ointments  . Septra [Sulfamethoxazole-Trimethoprim] Nausea And Vomiting  . Codeine Rash  . Novocain [Procaine] Palpitations    Patient Measurements: Height: 5\' 3"  (160 cm) Weight: 202 lb 9.6 oz (91.9 kg) IBW/kg (Calculated) : 52.4   Vital Signs: Temp: 98.3 F (36.8 C) (01/06 0619) Temp Source: Oral (01/06 0619) BP: 131/66 mmHg (01/06 0619) Pulse Rate: 96 (01/06 0619)  Labs:  Recent Labs  05/17/14 2054 05/18/14 0413 05/19/14 0415 05/19/14 2351 05/20/14 0435  WBC 0.1* 0.1* 0.2*  --   --   HGB 8.7* 7.2* 7.1* 7.4*  --   PLT 122* 106* 67*  --   --   CREATININE 0.82 0.84 0.99  --  0.95   Estimated Creatinine Clearance: 61 mL/min (by C-G formula based on Cr of 0.95).   Microbiology: Blood and urine cultures are pending.  Medical History: Past Medical History  Diagnosis Date  . Anxiety   . Back pain   . H/O bladder problems   . Cholelithiasis   . Depression   . High blood pressure   . Hypercholesteremia   . Kidney stones   . Gum disease   . Complication of anesthesia     bp goes up  . Wears glasses   . Wears dentures     top   Assessment: 69 y/o F with PMH of invasive ductal carcinoma of R breast s/p chemotherapy on 05/11/14 and Neulasta 05/12/14, HLD, depression, and periodontal disease who went to dentist on 05/12/14 with complaints of pain in L lower gums.  Patient was placed on Augmentin, but only took x 2 days d/t nausea and diarrhea.  Patient presented to ED on 05/17/14 with fevers and chills and was found to be neutropenic with critical WBC result of 0.1.  Patient placed on broad spectrum antibiotics with Vancomycin and Cefepime, with pharmacy asked to assist with dosing. Flagyl  added per MD on 1/5 for positive C.diff.  1/3 >> Zosyn x 1 in ED 1/3 >> Vancomycin >> 1/4 >> Cefepime >> 1/5 >> Metronidazole (MD) >>  Tmax:  100.5 F WBCs: 0.2 (on 1/5) Renal:  SCr 0.95, CrCl ~61 mL/min CG Lactic acid:  1.91 -> 1.41 Vancomycin trough: 16.5 mcg/mL   -Note 1/5 2200 dose given 1/6 at 0056 (~3 hours late)  -RN this AM indicates trough level drawn PRIOR to hanging of dose (closer to ~ 0930)  1/3 blood x 2: NGTD 1/4 urine: NGF 1/4 C.diff: Positive 1/4 MRSA PCR: negative  Goals of Therapy:  Vancomycin trough levels 15-20 mcg/ml Appropriate antibiotic dosing for renal function and indication Eradication of infection  Plan:   Increase Vancomycin to 1000 mg IV q12h, as true trough likely slightly subtherapeutic.  Repeat Vancomycin trough level at new steady state.  Continue Cefepime 2 grams IV q8h.  Monitor renal function, cultures, clinical course.   Lindell Spar, PharmD, BCPS Pager: [phone] 05/20/2014 1:46 PM

## 2014-05-20 NOTE — Progress Notes (Signed)
Patient ID: Cheyenne Riggs, female   DOB: 1946/01/15, 69 y.o.   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 Patient received chemotherapy on 05/11/2014 with Neulasta support on 05/12/2014 under the care of Dr. Lindi Adie. Patient did not have complaints of chest pain, shortness of breath or cough. There was no complaint of abdominal pain, nausea or vomiting. Off note, patient had pain in the left lower gum area and has seen her dentist 05/12/2014. She was placed on Augmentin but because of perfuse diarrhea patient only took it for 2 days. Because of diarrhea patient was also placed on Flagyl 250 mg every 8 hours by her dentist. Diarrhea has improved since the admission.  In ED, patient was hypotensive with blood pressure of 75/57. Blood pressure has improved with 1 L normal saline infusion to 123/80. Patient was tachypneic, an tachycardic, oxygen saturation of 90% with nasal cannula oxygen support and had a fever off 103.86F. She was started on broad-spectrum antibiotics for treatment of sepsis and febrile neutropenia. Chest x-ray was concerning for possible pneumonia.  Hospital course complicated with additional findings of C. difficile enteritis. Patient was started on Flagyl 05/19/2014.  Assessment/Plan:    Principal problem Acute respiratory failure with hypoxia / possible pneumonia  Hypoxia likely secondary to possible pneumonia as seen on chest x-ray.  Patient was started on broad-spectrum antibiotics, vancomycin and cefepime.  Please note that patient also has C. difficile and we started Flagyl as well.  Blood culture results are still  pending.  Respiratory status is stable at this time, oxygen saturation is 98%.  Active Problems: Sepsis secondary to possible pneumonia and C. difficile colitis  Sepsis criteria met on the admission with vital signs that included hypotension, tachycardia, tachypnea, slight hypoxia. In addition patient had fever. The white blood cell count on the admission was 0.1. Source of infection - pneumonia.  Please note that patient had complaints of diarrhea but this has improved since the admission. Stool was sent for C. difficile analysis and it was positive. He was started Flagyl 05/19/2014.  Febrile neutropenia  Likely secondary to sequela of recent chemotherapy.  Patient has received Neulasta support 05/12/2014.  Since white blood cell count still low at 0.2 we will give 1 dose of Neupogen today.  Patient's oncologist was informed of patient's admission.  Continue current antibiotic regimen, vancomycin and cefepime for possible pneumonia and Flagyl for C. difficile.  Breast cancer of upper-outer quadrant of right female breast  Patient has had recent chemotherapy 05/11/2014 with Neulasta support on 05/12/2014.  Anemia of chronic disease  Likely anemia of malignancy and sequela of chemotherapy  Hemoglobin is 7.1 this morning. We will transfuse 1 unit of PRBC today.  Pancytopenia / thrombocytopenia  Likely secondary to sequela of chemotherapy.  Platelet count is 61 this morning.  Continue to monitor CBC daily.  Monitor for bleeding.  Hypokalemia  Secondary to sepsis.  Supplemented.  Potassium is within normal limits this morning.   DVT Prophylaxis   Stopped Lovenox and use SCDs for DVT prophylaxis because of thrombocytopenia and the risk of bleeding.  Code Status: Full.  Family Communication:  plan of care discussed with the patient Disposition Plan: Home when  stable  IV access:  Peripheral IV  Procedures and diagnostic studies:    Dg Chest Port 1 View  05/17/2014    Mild vascular congestion noted; mild right basilar airspace opacity may reflect atelectasis or possibly pneumonia.   Medical Consultants:  Dr. Nicholas Lose, Oncology - informed of patient's admission   Other Consultants:  Nutrition Physical therapy   IAnti-Infectives:   Vancomycin 05/18/2014 -->  Cefepime 05/18/2014 --> Flagyl 05/19/2014 -->     Jabril Pursell, MD  [facility] Pager [phone]  If 7PM-7AM, please contact night-coverage [email or web address] Password [facility] 05/20/2014, 5:37 PM   LOS: 3 days    HPI/Subjective: No acute overnight events.  Objective: Filed Vitals:   05/19/14 2003 05/19/14 2232 05/20/14 0619 05/20/14 1536  BP: 152/72 163/82 131/66 145/77  Pulse: 96 109 96 95  Temp: 98.4 F (36.9 C) 99.8 F (37.7 C) 98.3 F (36.8 C) 99.2 F (37.3 C)  TempSrc: Oral Oral Oral Oral  Resp: $Remo'18 24 24 20  'SCKer$ Height:      Weight:      SpO2: 98% 98% 97% 98%    Intake/Output Summary (Last 24 hours) at 05/20/14 1737 Last data filed at 05/20/14 1536  Gross per 24 hour  Intake   1286 ml  Output      0 ml  Net   1286 ml    Exam:   General:  Pt is alert, follows commands appropriately, not in acute distress  Cardiovascular: Regular rate and rhythm, S1/S2 (+0  Respiratory: Clear to auscultation bilaterally, no wheezing, no crackles, no rhonchi  Abdomen: Soft, non tender, non distended, bowel sounds present  Extremities: trace LE edema, pulses DP and PT palpable bilaterally  Neuro: Grossly nonfocal  Data Reviewed: Basic Metabolic Panel:  Recent Labs Lab 05/17/14 2054 05/18/14 0413 05/19/14 0415 05/20/14 0435  NA 132* 138 136 139  K 3.4* 3.8 3.5 3.7  CL 101 104 105 107  CO2 $Re'25 28 24 25  'vNT$ GLUCOSE 188* 144* 123* 97  BUN $Re'15 13 10 12  'ldh$ CREATININE 0.82 0.84 0.99 0.95  CALCIUM 7.8* 7.6* 7.7* 7.9*  MG  --  1.5  --   --    Liver Function Tests:  Recent Labs Lab 05/17/14 2054  AST 18  ALT 20  ALKPHOS 75  BILITOT 1.0  PROT 5.5*   ALBUMIN 2.8*   No results for input(s): LIPASE, AMYLASE in the last 168 hours. No results for input(s): AMMONIA in the last 168 hours. CBC:  Recent Labs Lab 05/17/14 2054 05/18/14 0413 05/19/14 0415 05/19/14 2351  WBC 0.1* 0.1* 0.2*  --   NEUTROABS  --  0.0*  --   --   HGB 8.7* 7.2* 7.1* 7.4*  HCT 26.4* 21.3* 21.3* 22.1*  MCV 79.3 78.3 78.9  --   PLT 122* 106* 67*  --    Cardiac Enzymes: No results for input(s): CKTOTAL, CKMB, CKMBINDEX, TROPONINI in the last 168 hours. BNP: Invalid input(s): POCBNP CBG: No results for input(s): GLUCAP in the last 168 hours.  Recent Results (from the past 240 hour(s))  MRSA PCR Screening     Status: None   Collection Time: 05/18/14 12:44 AM  Result Value Ref Range Status   MRSA by PCR NEGATIVE NEGATIVE Final  Urine culture     Status: None   Collection Time: 05/18/14  1:11 AM  Result Value Ref Range Status   Specimen Description URINE, CLEAN CATCH  Final   Special Requests NONE  Final   Colony Count NO  GROWTH   Final   Report Status 05/19/2014 FINAL  Final  Clostridium Difficile by PCR     Status: Abnormal   Collection Time: 05/18/14  3:42 PM  Result Value Ref Range Status   C difficile by pcr POSITIVE (A) NEGATIVE Final     Scheduled Meds: . sodium chloride   Intravenous Once  . ceFEPime (MAXIPIME)   2 g Intravenous Q8H  . diphenhydrAMINE  25 mg Oral BID  . famotidine  20 mg Oral BID  . filgrastim (NEUPOGEN)   480 mcg Intravenous ONCE-1800  . FLUoxetine  10 mg Oral Daily  . metronidazole  500 mg Intravenous Q8H  . sodium chloride  3 mL Intravenous Q12H  . vancomycin  750 mg Intravenous Q12H   Continuous Infusions: . sodium chloride 0.9 % 1,000 mL with potassium chloride 20 mEq infusion 100 mL/hr at 05/20/14 1611

## 2014-05-20 NOTE — Progress Notes (Signed)
Blood appears like it was completed after 4 hours, but it was only marked as complete after the Saline was forced to run due to port needing to be cleared.

## 2014-05-20 NOTE — Telephone Encounter (Signed)
, °

## 2014-05-21 ENCOUNTER — Telehealth: Payer: Self-pay | Admitting: *Deleted

## 2014-05-21 ENCOUNTER — Inpatient Hospital Stay (HOSPITAL_COMMUNITY): Payer: Medicare Other

## 2014-05-21 LAB — CBC WITH DIFFERENTIAL/PLATELET
BASOS PCT: 0 % (ref 0–1)
Basophils Absolute: 0 10*3/uL (ref 0.0–0.1)
EOS ABS: 0 10*3/uL (ref 0.0–0.7)
EOS PCT: 0 % (ref 0–5)
HEMATOCRIT: 22.8 % — AB (ref 36.0–46.0)
HEMOGLOBIN: 7.8 g/dL — AB (ref 12.0–15.0)
Lymphocytes Relative: 5 % — ABNORMAL LOW (ref 12–46)
Lymphs Abs: 0.2 10*3/uL — ABNORMAL LOW (ref 0.7–4.0)
MCH: 27 pg (ref 26.0–34.0)
MCHC: 34.2 g/dL (ref 30.0–36.0)
MCV: 78.9 fL (ref 78.0–100.0)
MONO ABS: 0.4 10*3/uL (ref 0.1–1.0)
Monocytes Relative: 8 % (ref 3–12)
Neutro Abs: 4.2 10*3/uL (ref 1.7–7.7)
Neutrophils Relative %: 87 % — ABNORMAL HIGH (ref 43–77)
Platelets: 46 10*3/uL — ABNORMAL LOW (ref 150–400)
RBC: 2.89 MIL/uL — AB (ref 3.87–5.11)
RDW: 15.4 % (ref 11.5–15.5)
WBC: 4.8 10*3/uL (ref 4.0–10.5)

## 2014-05-21 MED ORDER — DIPHENHYDRAMINE HCL 25 MG PO CAPS
25.0000 mg | ORAL_CAPSULE | Freq: Two times a day (BID) | ORAL | Status: DC
  Administered 2014-05-21 – 2014-05-26 (×10): 25 mg via ORAL
  Filled 2014-05-21 (×10): qty 1

## 2014-05-21 MED ORDER — METRONIDAZOLE 500 MG PO TABS
500.0000 mg | ORAL_TABLET | Freq: Three times a day (TID) | ORAL | Status: DC
  Administered 2014-05-21 – 2014-05-26 (×14): 500 mg via ORAL
  Filled 2014-05-21 (×18): qty 1

## 2014-05-21 MED ORDER — FLUTICASONE PROPIONATE 50 MCG/ACT NA SUSP
1.0000 | Freq: Every day | NASAL | Status: DC
  Administered 2014-05-21 – 2014-05-24 (×4): 1 via NASAL
  Filled 2014-05-21: qty 16

## 2014-05-21 NOTE — Telephone Encounter (Signed)
Per staff message and POF I have scheduled appts. Advised scheduler of appts. JMW  

## 2014-05-21 NOTE — Progress Notes (Signed)
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 The patient received chemotherapy on 05/11/2014. She received Neulasta on 05/12/2014 under guidance of Dr. Lindi Adie. On the morning of admission, the patient began experiencing fevers and chills that prompted her to come to the emergency department. The patient denies any headache, chest pain, shortness breath, coughing, hemoptysis, vomiting, abdominal pain, dysuria, hematuria. Notably, the patient began noticing some pain in her left lower gums. She went to see her dentist on 05/12/2014. The patient was placed on Augmentin. She only took it 2 days because of diarrhea and nausea. She states that even with the 2 days of antibiotics for gum swelling has improved. Because of her diarrhea, the patient was also placed on Flagyl 250 mg every 8 hours by her dentist. Again, she only took this 2 days because it caused nausea.    PT Comments    Pt reports she feels her diarrhea is worse today however also says she has "chemo brain" and to check with her nurse.  Pt very pleasant and agreeable to ambulate however states she feels fatigued and weak.  Pt pleased with ambulation distance today.    Follow Up Recommendations  SNF;Supervision/Assistance - 24 hour     Equipment Recommendations  Rolling walker with 5" wheels    Recommendations for Other Services       Precautions / Restrictions Precautions Precautions: Fall Precaution Comments: neutropenic precautions, enteric    Mobility  Bed Mobility               General bed mobility comments: pt sitting EOB on arrival  Transfers Overall transfer level: Needs assistance Equipment used: Rolling  walker (2 wheeled) Transfers: Sit to/from Stand Sit to Stand: Min guard         General transfer comment: verbal cues for hand placement  Ambulation/Gait Ambulation/Gait assistance: Min guard Ambulation Distance (Feet): 160 Feet Assistive device: Rolling walker (2 wheeled) Gait Pattern/deviations: Step-through pattern;Wide base of support;Trunk flexed     General Gait Details: verbal cues for safe use of RW, pt fatigues quickly, reports no SOB however audibly breathing hard, SpO2 98% room air upon return to bed   Stairs            Wheelchair Mobility    Modified Rankin (Stroke Patients Only)       Balance                                    Cognition Arousal/Alertness: Awake/alert Behavior During Therapy: WFL for tasks assessed/performed Overall Cognitive Status: Within Functional Limits for tasks assessed                      Exercises      General Comments        Pertinent Vitals/Pain Pain Assessment: No/denies pain    Home Living                      Prior Function            PT Goals (current goals can now be found in the care plan section) Progress towards PT goals: Progressing toward goals  Frequency  Min 3X/week    PT Plan Current plan remains appropriate    Co-evaluation             End of Session   Activity Tolerance: Patient limited by fatigue Patient left: in bed;with call bell/phone within reach     Time: 1429-1453 PT Time Calculation (min) (ACUTE ONLY): 24 min  Charges:  $Gait Training: 23-37 mins                    G Codes:      Cia Garretson,KATHrine E 06-07-14, 3:41 PM Carmelia Bake, PT, DPT 2014/06/07 Pager: [phone]

## 2014-05-21 NOTE — Progress Notes (Signed)
Patient ID: Cheyenne Riggs, female   DOB: 10/13/1945, 69 y.o.   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 Patient received chemotherapy on 05/11/2014 with Neulasta support on 05/12/2014 under the care of Dr. Lindi Adie. Patient did not have complaints of chest pain, shortness of breath or cough. There was no complaint of abdominal pain, nausea or vomiting. Off note, patient had pain in the left lower gum area and has seen her dentist 05/12/2014. She was placed on Augmentin but because of perfuse diarrhea patient only took it for 2 days. Because of diarrhea patient was also placed on Flagyl 250 mg every 8 hours by her dentist. Diarrhea has improved since the admission.  In ED, patient was hypotensive with blood pressure of 75/57. Blood pressure has improved with 1 L normal saline infusion to 123/80. Patient was tachypneic, an tachycardic, oxygen saturation of 90% with nasal cannula oxygen support and had a fever off 103.78F. She was started on broad-spectrum antibiotics for treatment of sepsis and febrile neutropenia. Chest x-ray was concerning for possible pneumonia.  Hospital course complicated with additional findings of C. difficile enteritis. Patient was started on Flagyl 05/19/2014.  Assessment/Plan:    Principal problem Acute respiratory failure with hypoxia / possible pneumonia  Hypoxia likely secondary to possible pneumonia as seen on chest x-ray.  Patient was started on broad-spectrum antibiotics, vancomycin and cefepime.  Please note that patient also has C. difficile and we started Flagyl as well.  Blood culture results are still  pending.  Respiratory status is stable at this time, oxygen saturation is 98%.  Repeat CXR today and plan to transition to oral antibiotics soon.   Active Problems: Sepsis secondary to possible pneumonia and C. difficile colitis  Sepsis criteria met on the admission with vital signs that included hypotension, tachycardia, tachypnea, slight hypoxia. In addition patient had fever. The white blood cell count on the admission was 0.1. Source of infection - pneumonia and c diff.   Please note that patient had complaints of diarrhea but this has improved since the admission. Stool was sent for C. difficile analysis and it was positive. He was started Flagyl 05/19/2014.  Febrile neutropenia  Likely secondary to sequela of recent chemotherapy.  Patient has received Neulasta support 05/12/2014.  Since white blood cell count still low at 0.2 we will give 1 dose of Neupogen today.  Patient's oncologist was informed of patient's admission.  Continue current antibiotic regimen, vancomycin and cefepime for possible pneumonia and Flagyl for C. difficile.  Breast cancer of upper-outer quadrant of right female breast  Patient has had recent chemotherapy 05/11/2014 with Neulasta support on 05/12/2014.  Anemia of chronic disease  Likely anemia of malignancy and sequela of chemotherapy  Hemoglobin is 7.1 this morning. We will transfuse 1 unit of PRBC today.  Pancytopenia / thrombocytopenia  Likely secondary to sequela of chemotherapy.  Platelet count is 61 this morning.  Continue to monitor CBC daily.  Monitor for bleeding.  Hypokalemia  Secondary to sepsis.  Supplemented.  Potassium is within normal limits this morning.   DVT Prophylaxis   Stopped Lovenox and use SCDs for DVT prophylaxis because of thrombocytopenia and the risk of bleeding.  Code  Status: Full.  Family Communication:  plan of care discussed with the patient Disposition Plan: Home when stable  IV access:   Peripheral IV  Procedures and diagnostic studies:    Dg Chest Port 1 View 2014/05/19    Mild vascular congestion noted; mild right basilar airspace opacity may reflect atelectasis or possibly pneumonia.   Medical Consultants:  Dr. Nicholas Lose, Oncology - informed of patient's admission   Other Consultants:  Nutrition Physical therapy   IAnti-Infectives:   Vancomycin 05/18/2014 -->  Cefepime 05/18/2014 --> Flagyl 05/19/2014 -->     Reuben Knoblock, MD  [facility] Pager [phone]  If 7PM-7AM, please contact night-coverage [email or web address] Password [email or web address] 05/21/2014, 2:15 PM   LOS: 4 days    HPI/Subjective: No acute overnight events.  Objective: Filed Vitals:   05/20/14 1536 05/20/14 2112 05/21/14 0525 05/21/14 1405  BP: 145/77 134/70 140/61 150/59  Pulse: 95 104 95 98  Temp: 99.2 F (37.3 C) 98.2 F (36.8 C) 97.6 F (36.4 C) 98.2 F (36.8 C)  TempSrc: Oral Oral Oral Oral  Resp: _0 Height:      Weight:      SpO2: 98% 94% 99% 96%    Intake/Output Summary (Last 24 hours) at 05/21/14 1415 Last data filed at 05/21/14 1405  Gross per 24 hour  Intake   1902 ml  Output      0 ml  Net   1902 ml    Exam:   General:  Pt is alert, follows commands appropriately, not in acute distress  Cardiovascular: Regular rate and rhythm, S1/S2 (+0  Respiratory: Clear to auscultation bilaterally, no wheezing, no crackles, no rhonchi  Abdomen: Soft, non tender, non distended, bowel sounds present  Extremities: trace LE edema, pulses DP and PT palpable bilaterally  Neuro: Grossly nonfocal  Data Reviewed: Basic Metabolic Panel:  Recent Labs Lab 05/19/14 2054 05/18/14 0413 05/19/14 0415 05/20/14 0435  NA 132* 138 136 139  K 3.4* 3.8 3.5 3.7  CL 101 104 105 107  CO2 _1 GLUCOSE 188* 144* 123* 97  BUN _2 CREATININE 0.82 0.84 0.99 0.95  CALCIUM 7.8* 7.6* 7.7* 7.9*  MG  --  1.5  --   --    Liver Function Tests:  Recent Labs Lab  May 19, 2014 2054  AST 18  ALT 20  ALKPHOS 75  BILITOT 1.0  PROT 5.5*  ALBUMIN 2.8*   No results for input(s): LIPASE, AMYLASE in the last 168 hours. No results for input(s): AMMONIA in the last 168 hours. CBC:  Recent Labs Lab May 19, 2014 2054 05/18/14 0413 05/19/14 0415 05/19/14 2351 05/21/14 1130  WBC 0.1* 0.1* 0.2*  --  4.8  NEUTROABS  --  0.0*  --   --  4.2  HGB 8.7* 7.2* 7.1* 7.4* 7.8*  HCT 26.4* 21.3* 21.3* 22.1* 22.8*  MCV 79.3 78.3 78.9  --  78.9  PLT 122* 106* 67*  --  46*   Cardiac Enzymes: No results for input(s): CKTOTAL, CKMB, CKMBINDEX, TROPONINI in the last 168 hours. BNP: Invalid input(s): POCBNP CBG: No results for input(s): GLUCAP in the last 168 hours.  Recent Results (from the past 240 hour(s))  MRSA PCR Screening     Status: None   Collection Time: 05/18/14 12:44 AM  Result Value Ref Range Status   MRSA by PCR NEGATIVE NEGATIVE Final  Urine culture     Status: None   Collection Time: 05/18/14  1:11 AM  Result  Value Ref Range Status   Specimen Description URINE, CLEAN CATCH  Final   Special Requests NONE  Final   Colony Count NO GROWTH   Final   Report Status 05/19/2014 FINAL  Final  Clostridium Difficile by PCR     Status: Abnormal   Collection Time: 05/18/14  3:42 PM  Result Value Ref Range Status   C difficile by pcr POSITIVE (A) NEGATIVE Final     Scheduled Meds: . sodium chloride   Intravenous Once  . ceFEPime (MAXIPIME)   2 g Intravenous Q8H  . diphenhydrAMINE  25 mg Oral BID  . famotidine  20 mg Oral BID  . filgrastim (NEUPOGEN)   480 mcg Intravenous ONCE-1800  . FLUoxetine  10 mg Oral Daily  . metronidazole  500 mg Intravenous Q8H  . sodium chloride  3 mL Intravenous Q12H  . vancomycin  750 mg Intravenous Q12H   Continuous Infusions: . sodium chloride 0.9 % 1,000 mL with potassium chloride 20 mEq infusion 100 mL/hr at 05/21/14 0540

## 2014-05-22 LAB — CBC WITH DIFFERENTIAL/PLATELET
BASOS PCT: 0 % (ref 0–1)
Basophils Absolute: 0 10*3/uL (ref 0.0–0.1)
EOS ABS: 0 10*3/uL (ref 0.0–0.7)
Eosinophils Relative: 0 % (ref 0–5)
HCT: 22 % — ABNORMAL LOW (ref 36.0–46.0)
Hemoglobin: 7.5 g/dL — ABNORMAL LOW (ref 12.0–15.0)
Lymphocytes Relative: 6 % — ABNORMAL LOW (ref 12–46)
Lymphs Abs: 0.5 10*3/uL — ABNORMAL LOW (ref 0.7–4.0)
MCH: 27.1 pg (ref 26.0–34.0)
MCHC: 34.1 g/dL (ref 30.0–36.0)
MCV: 79.4 fL (ref 78.0–100.0)
MONOS PCT: 11 % (ref 3–12)
Monocytes Absolute: 1 10*3/uL (ref 0.1–1.0)
NEUTROS ABS: 7.3 10*3/uL (ref 1.7–7.7)
Neutrophils Relative %: 83 % — ABNORMAL HIGH (ref 43–77)
PLATELETS: 54 10*3/uL — AB (ref 150–400)
RBC: 2.77 MIL/uL — AB (ref 3.87–5.11)
RDW: 15.9 % — ABNORMAL HIGH (ref 11.5–15.5)
WBC: 8.8 10*3/uL (ref 4.0–10.5)

## 2014-05-22 LAB — BASIC METABOLIC PANEL
Anion gap: 9 (ref 5–15)
BUN: 11 mg/dL (ref 6–23)
CO2: 23 mmol/L (ref 19–32)
Calcium: 8.1 mg/dL — ABNORMAL LOW (ref 8.4–10.5)
Chloride: 105 mEq/L (ref 96–112)
Creatinine, Ser: 0.86 mg/dL (ref 0.50–1.10)
GFR calc Af Amer: 79 mL/min — ABNORMAL LOW (ref >90)
GFR calc non Af Amer: 68 mL/min — ABNORMAL LOW (ref >90)
Glucose, Bld: 125 mg/dL — ABNORMAL HIGH (ref 70–99)
Potassium: 3.2 mmol/L — ABNORMAL LOW (ref 3.5–5.1)
SODIUM: 137 mmol/L (ref 135–145)

## 2014-05-22 LAB — MAGNESIUM: MAGNESIUM: 1.5 mg/dL (ref 1.5–2.5)

## 2014-05-22 LAB — VANCOMYCIN, TROUGH: VANCOMYCIN TR: 24.4 ug/mL — AB (ref 10.0–20.0)

## 2014-05-22 MED ORDER — MAGNESIUM SULFATE IN D5W 10-5 MG/ML-% IV SOLN
1.0000 g | Freq: Once | INTRAVENOUS | Status: AC
  Administered 2014-05-22: 1 g via INTRAVENOUS
  Filled 2014-05-22: qty 100

## 2014-05-22 MED ORDER — VANCOMYCIN HCL IN DEXTROSE 750-5 MG/150ML-% IV SOLN
750.0000 mg | Freq: Two times a day (BID) | INTRAVENOUS | Status: DC
  Administered 2014-05-23 – 2014-05-25 (×5): 750 mg via INTRAVENOUS
  Filled 2014-05-22 (×5): qty 150

## 2014-05-22 MED ORDER — POTASSIUM CHLORIDE CRYS ER 20 MEQ PO TBCR
40.0000 meq | EXTENDED_RELEASE_TABLET | Freq: Two times a day (BID) | ORAL | Status: AC
  Administered 2014-05-22 (×2): 40 meq via ORAL
  Filled 2014-05-22 (×2): qty 2

## 2014-05-22 NOTE — Progress Notes (Signed)
Patient ID: TANESSA TIDD, female   DOB: Jul 22, 1945, 69 y.o.   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 Patient received chemotherapy on 05/11/2014 with Neulasta support on 05/12/2014 under the care of Dr. Lindi Adie. Patient did not have complaints of chest pain, shortness of breath or cough. There was no complaint of abdominal pain, nausea or vomiting. Off note, patient had pain in the left lower gum area and has seen her dentist 05/12/2014. She was placed on Augmentin but because of perfuse diarrhea patient only took it for 2 days. Because of diarrhea patient was also placed on Flagyl 250 mg every 8 hours by her dentist. Diarrhea has improved since the admission.  In ED, patient was hypotensive with blood pressure of 75/57. Blood pressure has improved with 1 L normal saline infusion to 123/80. Patient was tachypneic, an tachycardic, oxygen saturation of 90% with nasal cannula oxygen support and had a fever off 103.69F. She was started on broad-spectrum antibiotics for treatment of sepsis and febrile neutropenia. Chest x-ray was concerning for possible pneumonia.  Hospital course complicated with additional findings of C. difficile enteritis. Patient was started on Flagyl 05/19/2014.  Assessment/Plan:    Principal problem Acute respiratory failure with hypoxia / possible pneumonia  Hypoxia likely secondary to possible pneumonia as seen on chest x-ray.  Patient was started on broad-spectrum antibiotics, vancomycin and cefepime.  Please note that patient also has C. difficile and we started Flagyl as well.  Blood culture results are still  pending.  Respiratory status is stable at this time, oxygen saturation is 98%.  Repeat CXR today and plan to transition to oral antibiotics soon.   Active Problems: Sepsis secondary to possible pneumonia and C. difficile colitis  Sepsis criteria met on the admission with vital signs that included hypotension, tachycardia, tachypnea, slight hypoxia. In addition patient had fever. The white blood cell count on the admission was 0.1. Source of infection - pneumonia and c diff.   Please note that patient had complaints of diarrhea but this has improved since the admission. Stool was sent for C. difficile analysis and it was positive. He was started Flagyl 05/19/2014.  Febrile neutropenia  Likely secondary to sequela of recent chemotherapy.  Patient has received Neulasta support 05/12/2014.  Since white blood cell count still low at 0.2 we will give 1 dose of Neupogen today.  Patient's oncologist was informed of patient's admission.  Continue current antibiotic regimen, vancomycin and cefepime for possible pneumonia and Flagyl for C. difficile.  Breast cancer of upper-outer quadrant of right female breast  Patient has had recent chemotherapy 05/11/2014 with Neulasta support on 05/12/2014.  Anemia of chronic disease  Likely anemia of malignancy and sequela of chemotherapy  Hemoglobin is 7.1 this morning. We will transfuse 1 unit of PRBC today.  Pancytopenia / thrombocytopenia  Likely secondary to sequela of chemotherapy.  Platelet count is 61 this morning.  Continue to monitor CBC daily.  Monitor for bleeding.  Hypokalemia  Secondary to sepsis.  Supplement as needed.     DVT Prophylaxis   Stopped Lovenox and use SCDs for DVT prophylaxis because of thrombocytopenia and the risk of bleeding.  Code Status: Full.  Family  Communication:  plan of care discussed with the patient Disposition Plan: Home when stable  IV access:  Peripheral IV  Procedures and diagnostic  studies:    Dg Chest Port 1 View 05-21-14    Mild vascular congestion noted; mild right basilar airspace opacity may reflect atelectasis or possibly pneumonia.   Medical Consultants:  Dr. Nicholas Lose, Oncology - informed of patient's admission   Other Consultants:  Nutrition Physical therapy   IAnti-Infectives:   Vancomycin 05/18/2014 -->  Cefepime 05/18/2014 --> Flagyl 05/19/2014 -->     Gitel Beste, MD  [facility] Pager [phone]  If 7PM-7AM, please contact night-coverage [email or web address] Password [facility] 05/22/2014, 6:17 PM   LOS: 5 days    HPI/Subjective: No acute overnight events.  Objective: Filed Vitals:   05/22/14 0049 05/22/14 0338 05/22/14 0545 05/22/14 1400  BP: 155/67  140/71 155/58  Pulse: 97  94 87  Temp: 98.5 F (36.9 C)  98 F (36.7 C) 98.1 F (36.7 C)  TempSrc: Oral  Oral Oral  Resp: _0 Height:      Weight:      SpO2: 96% 97% 100% 90%    Intake/Output Summary (Last 24 hours) at 05/22/14 1817 Last data filed at 05/22/14 1800  Gross per 24 hour  Intake   1190 ml  Output    975 ml  Net    215 ml    Exam:   General:  Pt is alert, follows commands appropriately, not in acute distress  Cardiovascular: Regular rate and rhythm, S1/S2 (+0  Respiratory: Clear to auscultation bilaterally, no wheezing, no crackles, no rhonchi  Abdomen: Soft, non tender, non distended, bowel sounds present  Extremities: trace LE edema, pulses DP and PT palpable bilaterally  Neuro: Grossly nonfocal  Data Reviewed: Basic Metabolic Panel:  Recent Labs Lab May 21, 2014 2054 05/18/14 0413 05/19/14 0415 05/20/14 0435 05/22/14 0645  NA 132* 138 136 139 137  K 3.4* 3.8 3.5 3.7 3.2*  CL 101 104 105 107 105  CO2 _1 GLUCOSE 188* 144* 123* 97 125*  BUN _2 CREATININE 0.82 0.84 0.99 0.95 0.86  CALCIUM 7.8* 7.6* 7.7* 7.9* 8.1*  MG  --  1.5  --   --  1.5   Liver Function Tests:  Recent Labs Lab 05-21-2014 2054  AST 18   ALT 20  ALKPHOS 75  BILITOT 1.0  PROT 5.5*  ALBUMIN 2.8*   No results for input(s): LIPASE, AMYLASE in the last 168 hours. No results for input(s): AMMONIA in the last 168 hours. CBC:  Recent Labs Lab 2014-05-21 2054 05/18/14 0413 05/19/14 0415 05/19/14 2351 05/21/14 1130 05/22/14 0645  WBC 0.1* 0.1* 0.2*  --  4.8 8.8  NEUTROABS  --  0.0*  --   --  4.2 7.3  HGB 8.7* 7.2* 7.1* 7.4* 7.8* 7.5*  HCT 26.4* 21.3* 21.3* 22.1* 22.8* 22.0*  MCV 79.3 78.3 78.9  --  78.9 79.4  PLT 122* 106* 67*  --  46* 54*   Cardiac Enzymes: No results for input(s): CKTOTAL, CKMB, CKMBINDEX, TROPONINI in the last 168 hours. BNP: Invalid input(s): POCBNP CBG: No results for input(s): GLUCAP in the last 168 hours.  Recent Results (from the past 240 hour(s))  MRSA PCR Screening     Status: None   Collection Time: 05/18/14 12:44 AM  Result Value Ref Range Status   MRSA by PCR NEGATIVE NEGATIVE Final  Urine culture     Status:  None   Collection Time: 05/18/14  1:11 AM  Result Value Ref Range Status   Specimen Description URINE, CLEAN CATCH  Final   Special Requests NONE  Final   Colony Count NO GROWTH   Final   Report Status 05/19/2014 FINAL  Final  Clostridium Difficile by PCR     Status: Abnormal   Collection Time: 05/18/14  3:42 PM  Result Value Ref Range Status   C difficile by pcr POSITIVE (A) NEGATIVE Final     Scheduled Meds: . sodium chloride   Intravenous Once  . ceFEPime (MAXIPIME)   2 g Intravenous Q8H  . diphenhydrAMINE  25 mg Oral BID  . famotidine  20 mg Oral BID  . filgrastim (NEUPOGEN)   480 mcg Intravenous ONCE-1800  . FLUoxetine  10 mg Oral Daily  . metronidazole  500 mg Intravenous Q8H  . sodium chloride  3 mL Intravenous Q12H  . vancomycin  750 mg Intravenous Q12H   Continuous Infusions:

## 2014-05-22 NOTE — Care Management Note (Signed)
    Page 1 of 1   05/22/2014     1:45:14 PM CARE MANAGEMENT NOTE 05/22/2014  Patient:  Cheyenne Riggs, Cheyenne Riggs   Account Number:  [id number]  Date Initiated:  05/22/2014  Documentation initiated by:  Sunday Spillers  Subjective/Objective Assessment:   69 yo female admitted with neutropenic fever, c. diff positive. PTA lived at home alone.     Action/Plan:   Home when stable   Anticipated DC Date:  05/25/2014   Anticipated DC Plan:  [place] referral  Clinical Social Worker      DC Planning Services  CM consult      Choice offered to / List presented to:             Status of service:  In process, will continue to follow Medicare Important Message given?   (If response is "NO", the following Medicare IM given date fields will be blank) Date Medicare IM given:   Medicare IM given by:   Date Additional Medicare IM given:   Additional Medicare IM given by:    Discharge Disposition:    Per UR Regulation:  Reviewed for med. necessity/level of care/duration of stay  If discussed at [place] of Stay Meetings, dates discussed:    Comments:  05-22-13 [place] 1330 Spoke with patient and son at bedside. Patient not sure of d/c plan yet. Options are to d/c home with friend assistance, d/c to son's home in [place], or d/c to SNF for rehab. Some discussion about private agency assistance, patient did not think that was an option due to cost. Per CSW has bed options for SNF, provided list of [facility] agencies for choice. Discussed if need HH and going to son's would need a provider and [place] agency in that area. Patient indicated would not d/c over weekend. Will f/u on Monday.

## 2014-05-22 NOTE — Progress Notes (Signed)
ANTIBIOTIC CONSULT NOTE - Follow-Up  Pharmacy Consult for:  Vancomycin, Cefepime Indication:  Sepsis, Febrile Neutropenia  Allergies  Allergen Reactions  . Ciprofloxacin Nausea And Vomiting  . Demerol [Meperidine] Hives  . Lidocaine     palpatations  . Other Swelling    Eye ointments  . Septra [Sulfamethoxazole-Trimethoprim] Nausea And Vomiting  . Codeine Rash  . Novocain [Procaine] Palpitations    Patient Measurements: Height: 5\' 3"  (160 cm) Weight: 202 lb 9.6 oz (91.9 kg) IBW/kg (Calculated) : 52.4   Vital Signs: Temp: 98 F (36.7 C) (01/08 0545) Temp Source: Oral (01/08 0545) BP: 140/71 mmHg (01/08 0545) Pulse Rate: 94 (01/08 0545)  Labs:  Recent Labs  05/19/14 2351 05/20/14 0435 05/21/14 1130 05/22/14 0645  WBC  --   --  4.8 8.8  HGB 7.4*  --  7.8* 7.5*  PLT  --   --  46* 54*  CREATININE  --  0.95  --  0.86   Estimated Creatinine Clearance: 67.4 mL/min (by C-G formula based on Cr of 0.86).    Medical History: Past Medical History  Diagnosis Date  . Anxiety   . Back pain   . H/O bladder problems   . Cholelithiasis   . Depression   . High blood pressure   . Hypercholesteremia   . Kidney stones   . Gum disease   . Complication of anesthesia     bp goes up  . Wears glasses   . Wears dentures     top   Microbiology: 1/3 blood x 2: NGTD 1/4 urine: NGF 1/4 C.diff: Positive 1/4 MRSA PCR screen: negative  Antimicrobials: 1/3 >> Zosyn x 1 in ED 1/3 >> Vancomycin >> 1/4 >> Cefepime >> 1/5 >> Metronidazole (MD) >>  Antibiotic levels and dosage changes: 1/6: Vancomycin trough 16.5 on 750 mg IV q12h, changed to 1g IV q12h (see progress note)  Assessment: 69 y/o F with PMH of invasive ductal carcinoma of R breast s/p chemotherapy on 05/11/14 and Neulasta 05/12/14, HLD, depression, and periodontal disease who went to dentist on 05/12/14 with complaints of pain in L lower gums.  Patient was placed on Augmentin, but only took x 2 days d/t nausea and  diarrhea.  Patient presented to ED on 05/17/14 with fevers and chills and was found to be neutropenic with critical WBC result of 0.1.  Patient placed on broad spectrum antibiotics with Vancomycin and Cefepime, with pharmacy asked to assist with dosing. Flagyl added per MD on 1/5 for positive C.diff.    Today, 05/22/14: D#6 vancomycin now 1g IV q12h for sepsis, pneumonia in setting of febrile neutropenia D#5 cefepime 2g IV q8h for same indication as above D#4 metronidazole 500 mg PO q8h [MD dosing] for C.diff diarrhea Remains afebrile Neutropenia resolved (ANC = 7.3K) SCr stable Vancomycin trough supratherapeutic.   Goals of Therapy:  Vancomycin trough levels 15-20 mcg/ml Appropriate antibiotic dosing for renal function and indication Eradication of infection  Plan:  1. Stop vancomycin dose in progress (discussed with RN). 2. Reduce vancomycin to 750 mg IV q12h, next dose tomorrow at 0800 3. Continue present cefepime dosage (2g IV q8h) 4. Await word on planned duration of therapy and potential antibiotic de-escalation.  Clayburn Pert, PharmD, BCPS Pager: [phone] 05/22/2014  8:40 AM

## 2014-05-22 NOTE — Progress Notes (Signed)
CSW assisting with d/c planning. Pt trans to 5w from 4th floor. Pt is considering SNF placement vs Home. CSW had provided SNF bed offers prior to trans. NSG reports pt will discuss d/c options with family today and alert CSW / RNCM with decision. CSW will continue to be available to assist with d/c planning as needed.  Werner Lean LCSW [phone]

## 2014-05-22 NOTE — Progress Notes (Signed)
CSW met with pt / son / RNCM at bedside. Pt still considering options. She does not expect d/c over the weekend. Pt has SNF bed offers. She has been encouraged to have her nsg contact weekend CSW / RNCM depending on her d/c plan, if  assistance is needed during the weekend. Otherwise, CSW will see pt Monday am to confirm d/c plan.  Werner Lean LCSW [phone]

## 2014-05-23 LAB — BASIC METABOLIC PANEL
ANION GAP: 8 (ref 5–15)
BUN: 10 mg/dL (ref 6–23)
CO2: 23 mmol/L (ref 19–32)
CREATININE: 0.92 mg/dL (ref 0.50–1.10)
Calcium: 8.3 mg/dL — ABNORMAL LOW (ref 8.4–10.5)
Chloride: 107 mEq/L (ref 96–112)
GFR, EST AFRICAN AMERICAN: 72 mL/min — AB (ref >90)
GFR, EST NON AFRICAN AMERICAN: 63 mL/min — AB (ref >90)
Glucose, Bld: 140 mg/dL — ABNORMAL HIGH (ref 70–99)
Potassium: 3.7 mmol/L (ref 3.5–5.1)
SODIUM: 138 mmol/L (ref 135–145)

## 2014-05-23 MED ORDER — CETYLPYRIDINIUM CHLORIDE 0.05 % MT LIQD
7.0000 mL | Freq: Two times a day (BID) | OROMUCOSAL | Status: DC
  Administered 2014-05-23 – 2014-05-25 (×3): 7 mL via OROMUCOSAL

## 2014-05-23 NOTE — Progress Notes (Signed)
Patient ID: Cheyenne Riggs, female   DOB: Jul 22, 1945, 69 y.o.   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 Patient received chemotherapy on 05/11/2014 with Neulasta support on 05/12/2014 under the care of Dr. Lindi Adie. Patient did not have complaints of chest pain, shortness of breath or cough. There was no complaint of abdominal pain, nausea or vomiting. Off note, patient had pain in the left lower gum area and has seen her dentist 05/12/2014. She was placed on Augmentin but because of perfuse diarrhea patient only took it for 2 days. Because of diarrhea patient was also placed on Flagyl 250 mg every 8 hours by her dentist. Diarrhea has improved since the admission.  In ED, patient was hypotensive with blood pressure of 75/57. Blood pressure has improved with 1 L normal saline infusion to 123/80. Patient was tachypneic, an tachycardic, oxygen saturation of 90% with nasal cannula oxygen support and had a fever off 103.69F. She was started on broad-spectrum antibiotics for treatment of sepsis and febrile neutropenia. Chest x-ray was concerning for possible pneumonia.  Hospital course complicated with additional findings of C. difficile enteritis. Patient was started on Flagyl 05/19/2014.  Assessment/Plan:    Principal problem Acute respiratory failure with hypoxia / possible pneumonia  Hypoxia likely secondary to possible pneumonia as seen on chest x-ray.  Patient was started on broad-spectrum antibiotics, vancomycin and cefepime.  Please note that patient also has C. difficile and we started Flagyl as well.  Blood culture results are still  pending.  Respiratory status is stable at this time, oxygen saturation is 98%.  Repeat CXR today and plan to transition to oral antibiotics soon.   Active Problems: Sepsis secondary to possible pneumonia and C. difficile colitis  Sepsis criteria met on the admission with vital signs that included hypotension, tachycardia, tachypnea, slight hypoxia. In addition patient had fever. The white blood cell count on the admission was 0.1. Source of infection - pneumonia and c diff.   Please note that patient had complaints of diarrhea but this has improved since the admission. Stool was sent for C. difficile analysis and it was positive. He was started Flagyl 05/19/2014.  Febrile neutropenia  Likely secondary to sequela of recent chemotherapy.  Patient has received Neulasta support 05/12/2014.  Since white blood cell count still low at 0.2 we will give 1 dose of Neupogen today.  Patient's oncologist was informed of patient's admission.  Continue current antibiotic regimen, vancomycin and cefepime for possible pneumonia and Flagyl for C. difficile.  Breast cancer of upper-outer quadrant of right female breast  Patient has had recent chemotherapy 05/11/2014 with Neulasta support on 05/12/2014.  Anemia of chronic disease  Likely anemia of malignancy and sequela of chemotherapy  Hemoglobin is 7.1 this morning. We will transfuse 1 unit of PRBC today.  Pancytopenia / thrombocytopenia  Likely secondary to sequela of chemotherapy.  Platelet count is 61 this morning.  Continue to monitor CBC daily.  Monitor for bleeding.  Hypokalemia  Secondary to sepsis.  Supplement as needed.     DVT Prophylaxis   Stopped Lovenox and use SCDs for DVT prophylaxis because of thrombocytopenia and the risk of bleeding.  Code Status: Full.  Family  Communication:  plan of care discussed with the patient Disposition Plan: Home when stable  IV access:  Peripheral IV  Procedures and diagnostic  studies:    Dg Chest Port 1 View 30-May-2014    Mild vascular congestion noted; mild right basilar airspace opacity may reflect atelectasis or possibly pneumonia.   Medical Consultants:  Dr. Nicholas Lose, Oncology - informed of patient's admission   Other Consultants:  Nutrition Physical therapy   IAnti-Infectives:   Vancomycin 05/18/2014 -->  Cefepime 05/18/2014 --> Flagyl 05/19/2014 -->     Lisvet Rasheed, MD  [facility] Pager [phone]  If 7PM-7AM, please contact night-coverage [email or web address] Password [facility] 05/23/2014, 6:25 PM   LOS: 6 days    HPI/Subjective: No acute overnight events. Diarrhea has improved.   Objective: Filed Vitals:   05/22/14 1400 05/22/14 2220 05/23/14 0630 05/23/14 1358  BP: 155/58 162/74 160/66 150/69  Pulse: 87 97 101 92  Temp: 98.1 F (36.7 C) 98.3 F (36.8 C) 98.4 F (36.9 C) 98.4 F (36.9 C)  TempSrc: Oral Oral Oral Oral  Resp: $Remo'18 18 18 24  'cYlmi$ Height:      Weight:      SpO2: 90% 93% 95% 97%    Intake/Output Summary (Last 24 hours) at 05/23/14 1825 Last data filed at 05/23/14 1400  Gross per 24 hour  Intake   1270 ml  Output   1000 ml  Net    270 ml    Exam:   General:  Pt is alert, follows commands appropriately, not in acute distress  Cardiovascular: Regular rate and rhythm, S1/S2 (+0  Respiratory: Clear to auscultation bilaterally, no wheezing, no crackles, no rhonchi  Abdomen: Soft, non tender, non distended, bowel sounds present  Extremities: trace LE edema, pulses DP and PT palpable bilaterally  Neuro: Grossly nonfocal  Data Reviewed: Basic Metabolic Panel:  Recent Labs Lab 05/18/14 0413 05/19/14 0415 05/20/14 0435 05/22/14 0645 05/23/14 0435  NA 138 136 139 137 138  K 3.8 3.5 3.7 3.2* 3.7  CL 104 105 107 105 107  CO2 $Re'28 24 25 23 23  'GOV$ GLUCOSE 144* 123* 97 125* 140*  BUN $Re'13 10 12 11 10  'pYa$ CREATININE 0.84 0.99 0.95 0.86 0.92  CALCIUM 7.6* 7.7* 7.9* 8.1* 8.3*  MG 1.5  --   --  1.5  --    Liver  Function Tests:  Recent Labs Lab 2014-05-30 2054  AST 18  ALT 20  ALKPHOS 75  BILITOT 1.0  PROT 5.5*  ALBUMIN 2.8*   No results for input(s): LIPASE, AMYLASE in the last 168 hours. No results for input(s): AMMONIA in the last 168 hours. CBC:  Recent Labs Lab 2014/05/30 2054 05/18/14 0413 05/19/14 0415 05/19/14 2351 05/21/14 1130 05/22/14 0645  WBC 0.1* 0.1* 0.2*  --  4.8 8.8  NEUTROABS  --  0.0*  --   --  4.2 7.3  HGB 8.7* 7.2* 7.1* 7.4* 7.8* 7.5*  HCT 26.4* 21.3* 21.3* 22.1* 22.8* 22.0*  MCV 79.3 78.3 78.9  --  78.9 79.4  PLT 122* 106* 67*  --  46* 54*   Cardiac Enzymes: No results for input(s): CKTOTAL, CKMB, CKMBINDEX, TROPONINI in the last 168 hours. BNP: Invalid input(s): POCBNP CBG: No results for input(s): GLUCAP in the last 168 hours.  Recent Results (from the past 240 hour(s))  MRSA PCR Screening     Status: None   Collection Time: 05/18/14 12:44 AM  Result Value Ref Range Status   MRSA by PCR NEGATIVE NEGATIVE Final  Urine  culture     Status: None   Collection Time: 05/18/14  1:11 AM  Result Value Ref Range Status   Specimen Description URINE, CLEAN CATCH  Final   Special Requests NONE  Final   Colony Count NO GROWTH   Final   Report Status 05/19/2014 FINAL  Final  Clostridium Difficile by PCR     Status: Abnormal   Collection Time: 05/18/14  3:42 PM  Result Value Ref Range Status   C difficile by pcr POSITIVE (A) NEGATIVE Final     Scheduled Meds: . sodium chloride   Intravenous Once  . ceFEPime (MAXIPIME)   2 g Intravenous Q8H  . diphenhydrAMINE  25 mg Oral BID  . famotidine  20 mg Oral BID  . filgrastim (NEUPOGEN)   480 mcg Intravenous ONCE-1800  . FLUoxetine  10 mg Oral Daily  . metronidazole  500 mg Intravenous Q8H  . sodium chloride  3 mL Intravenous Q12H  . vancomycin  750 mg Intravenous Q12H   Continuous Infusions:

## 2014-05-24 ENCOUNTER — Inpatient Hospital Stay (HOSPITAL_COMMUNITY): Payer: Medicare Other

## 2014-05-24 DIAGNOSIS — A0472 Enterocolitis due to Clostridium difficile, not specified as recurrent: Secondary | ICD-10-CM | POA: Diagnosis present

## 2014-05-24 DIAGNOSIS — J189 Pneumonia, unspecified organism: Secondary | ICD-10-CM | POA: Diagnosis present

## 2014-05-24 LAB — BASIC METABOLIC PANEL
Anion gap: 6 (ref 5–15)
BUN: 8 mg/dL (ref 6–23)
CO2: 23 mmol/L (ref 19–32)
Calcium: 8.2 mg/dL — ABNORMAL LOW (ref 8.4–10.5)
Chloride: 108 mEq/L (ref 96–112)
Creatinine, Ser: 0.79 mg/dL (ref 0.50–1.10)
GFR, EST NON AFRICAN AMERICAN: 84 mL/min — AB (ref >90)
Glucose, Bld: 120 mg/dL — ABNORMAL HIGH (ref 70–99)
Potassium: 3.4 mmol/L — ABNORMAL LOW (ref 3.5–5.1)
SODIUM: 137 mmol/L (ref 135–145)

## 2014-05-24 LAB — CBC
HCT: 22.7 % — ABNORMAL LOW (ref 36.0–46.0)
HEMOGLOBIN: 7.6 g/dL — AB (ref 12.0–15.0)
MCH: 26.8 pg (ref 26.0–34.0)
MCHC: 33.5 g/dL (ref 30.0–36.0)
MCV: 79.9 fL (ref 78.0–100.0)
PLATELETS: 104 10*3/uL — AB (ref 150–400)
RBC: 2.84 MIL/uL — ABNORMAL LOW (ref 3.87–5.11)
RDW: 16.6 % — AB (ref 11.5–15.5)
WBC: 12.4 10*3/uL — ABNORMAL HIGH (ref 4.0–10.5)

## 2014-05-24 LAB — CULTURE, BLOOD (ROUTINE X 2)
CULTURE: NO GROWTH
Culture: NO GROWTH

## 2014-05-24 MED ORDER — POTASSIUM CHLORIDE CRYS ER 20 MEQ PO TBCR
40.0000 meq | EXTENDED_RELEASE_TABLET | Freq: Two times a day (BID) | ORAL | Status: AC
Start: 2014-05-24 — End: 2014-05-24
  Administered 2014-05-24 (×2): 40 meq via ORAL
  Filled 2014-05-24 (×2): qty 2

## 2014-05-24 NOTE — Progress Notes (Signed)
Patient ID: Cheyenne Riggs, female   DOB: [birth year], 69 y.o.   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 Patient received chemotherapy on 05/11/2014 with Neulasta support on 05/12/2014 under the care of Dr. Lindi Adie. Patient did not have complaints of chest pain, shortness of breath or cough. There was no complaint of abdominal pain, nausea or vomiting. Off note, patient had pain in the left lower gum area and has seen her dentist 05/12/2014. She was placed on Augmentin but because of perfuse diarrhea patient only took it for 2 days. Because of diarrhea patient was also placed on Flagyl 250 mg every 8 hours by her dentist. Diarrhea has improved since the admission.  In ED, patient was hypotensive with blood pressure of 75/57. Blood pressure has improved with 1 L normal saline infusion to 123/80. Patient was tachypneic, an tachycardic, oxygen saturation of 90% with nasal cannula oxygen support and had a fever off 103.63F. She was started on broad-spectrum antibiotics for treatment of sepsis and febrile neutropenia. Chest x-ray was concerning for possible pneumonia.  Hospital course complicated with additional findings of C. difficile enteritis. Patient was started on Flagyl 05/19/2014. Repeat CXR on 1/10 showed persistent multilobar pneumonia vs atypical pneumonitis. We will complete a course of 7 days of antibiotics for the pneumonia with broad spectrum antibiotics.   Assessment/Plan:    Principal problem Acute respiratory failure with hypoxia / possible pneumonia  Hypoxia likely secondary to possible pneumonia as seen on chest x-ray.  Patient  was started on broad-spectrum antibiotics, vancomycin and cefepime.  Please note that patient also has C. difficile and we started Flagyl as well.  Blood culture results are still pending.  Respiratory status is stable at this time, oxygen saturation is 98%.  Repeat CXR today showed persistent pneumonia.   Active Problems: Sepsis secondary to possible pneumonia and C. difficile colitis  Sepsis criteria met on the admission with vital signs that included hypotension, tachycardia, tachypnea, slight hypoxia. In addition patient had fever. The white blood cell count on the admission was 0.1. Source of infection - pneumonia and c diff.   Please note that patient had complaints of diarrhea but this has improved since the admission. Stool was sent for C. difficile analysis and it was positive. He was started Flagyl 05/19/2014.  Febrile neutropenia  Likely secondary to sequela of recent chemotherapy.  Patient has received Neulasta support 05/12/2014.  Since white blood cell count still low at 0.2 we will give 1 dose of Neupogen today.  Patient's oncologist was informed of patient's admission.  Continue current antibiotic regimen, vancomycin and cefepime for possible pneumonia and Flagyl for C. difficile.  Breast cancer of upper-outer quadrant of right female breast  Patient has had recent chemotherapy 05/11/2014 with Neulasta support on 05/12/2014.  Anemia of chronic disease  Likely anemia of malignancy and sequela of chemotherapy  Hemoglobin is 7.1 this morning. We will transfuse 1 unit of PRBC . Repeat H&h is still around 7.4  Pancytopenia / thrombocytopenia  Likely secondary to sequela of chemotherapy.  Platelet count is 104 today , much improved.   Continue to monitor CBC daily.  Monitor for bleeding.  Hypokalemia  Secondary to sepsis.  Supplement as needed.     DVT Prophylaxis   Stopped Lovenox and use SCDs for DVT prophylaxis because of thrombocytopenia and the  risk of bleeding.  Code Status: Full.  Family Communication:  plan of care discussed with the patient Disposition Plan: Home when stable  IV access:  Peripheral IV  Procedures and diagnostic studies:    Dg Chest Port 1 View 06/12/14    Mild vascular congestion noted; mild right basilar airspace opacity may reflect atelectasis or possibly pneumonia.   Medical Consultants:  Dr. Nicholas Lose, Oncology - informed of patient's admission   Other Consultants:  Nutrition Physical therapy   IAnti-Infectives:   Vancomycin 05/18/2014 -->  Cefepime 05/18/2014 --> Flagyl 05/19/2014 -->     Tylena Prisk, MD  [facility] Pager [phone]  If 7PM-7AM, please contact night-coverage [email or web address] Password [email or web address] 05/24/2014, 5:29 PM   LOS: 7 days    HPI/Subjective: No acute overnight events. Diarrhea has improved.   Objective: Filed Vitals:   05/23/14 1358 05/23/14 2239 05/24/14 0542 05/24/14 1400  BP: 150/69 142/78 144/50   Pulse: 92 92 98   Temp: 98.4 F (36.9 C) 99.5 F (37.5 C) 98.3 F (36.8 C)   TempSrc: Oral Oral Oral   Resp: $Remo'24 18 18   'opHCj$ Height:      Weight:      SpO2: 97% 98% 98% 96%    Intake/Output Summary (Last 24 hours) at 05/24/14 1729 Last data filed at 05/24/14 1400  Gross per 24 hour  Intake   1170 ml  Output   1400 ml  Net   -230 ml    Exam:   General:  Pt is alert, follows commands appropriately, not in acute distress  Cardiovascular: Regular rate and rhythm, S1/S2 (+0  Respiratory: Clear to auscultation bilaterally, no wheezing, no crackles, no rhonchi  Abdomen: Soft, non tender, non distended, bowel sounds present  Extremities: trace LE edema, pulses DP and PT palpable bilaterally  Neuro: Grossly nonfocal  Data Reviewed: Basic Metabolic Panel:  Recent Labs Lab 05/18/14 0413 05/19/14 0415 05/20/14 0435 05/22/14 0645 05/23/14 0435 05/24/14 0435  NA 138 136 139 137 138 137  K 3.8 3.5 3.7 3.2* 3.7 3.4*  CL 104 105 107 105 107  108  CO2 $Re'28 24 25 23 23 23  'IEG$ GLUCOSE 144* 123* 97 125* 140* 120*  BUN $Re'13 10 12 11 10 8  'Lsa$ CREATININE 0.84 0.99 0.95 0.86 0.92 0.79  CALCIUM 7.6* 7.7* 7.9* 8.1* 8.3* 8.2*  MG 1.5  --   --  1.5  --   --    Liver Function Tests:  Recent Labs Lab 06/12/14 2054  AST 18  ALT 20  ALKPHOS 75  BILITOT 1.0  PROT 5.5*  ALBUMIN 2.8*   No results for input(s): LIPASE, AMYLASE in the last 168 hours. No results for input(s): AMMONIA in the last 168 hours. CBC:  Recent Labs Lab 05/18/14 0413 05/19/14 0415 05/19/14 2351 05/21/14 1130 05/22/14 0645 05/24/14 0435  WBC 0.1* 0.2*  --  4.8 8.8 12.4*  NEUTROABS 0.0*  --   --  4.2 7.3  --   HGB 7.2* 7.1* 7.4* 7.8* 7.5* 7.6*  HCT 21.3* 21.3* 22.1* 22.8* 22.0* 22.7*  MCV 78.3 78.9  --  78.9 79.4 79.9  PLT 106* 67*  --  46* 54* 104*   Cardiac Enzymes: No results for input(s): CKTOTAL, CKMB, CKMBINDEX, TROPONINI in the last 168 hours. BNP: Invalid input(s): POCBNP CBG: No results for input(s): GLUCAP in the last 168  hours.  Recent Results (from the past 240 hour(s))  MRSA PCR Screening     Status: None   Collection Time: 05/18/14 12:44 AM  Result Value Ref Range Status   MRSA by PCR NEGATIVE NEGATIVE Final  Urine culture     Status: None   Collection Time: 05/18/14  1:11 AM  Result Value Ref Range Status   Specimen Description URINE, CLEAN CATCH  Final   Special Requests NONE  Final   Colony Count NO GROWTH   Final   Report Status 05/19/2014 FINAL  Final  Clostridium Difficile by PCR     Status: Abnormal   Collection Time: 05/18/14  3:42 PM  Result Value Ref Range Status   C difficile by pcr POSITIVE (A) NEGATIVE Final     Scheduled Meds: . sodium chloride   Intravenous Once  . ceFEPime (MAXIPIME)   2 g Intravenous Q8H  . diphenhydrAMINE  25 mg Oral BID  . famotidine  20 mg Oral BID  . filgrastim (NEUPOGEN)   480 mcg Intravenous ONCE-1800  . FLUoxetine  10 mg Oral Daily  . metronidazole  500 mg Intravenous Q8H  . sodium  chloride  3 mL Intravenous Q12H  . vancomycin  750 mg Intravenous Q12H   Continuous Infusions:

## 2014-05-24 NOTE — Discharge Planning (Signed)
SATURATION QUALIFICATIONS: (This note is used to comply with regulatory documentation for home oxygen)  Patient Saturations on Room Air at Rest = 96%  Patient Saturations on Room Air while Ambulating = 93%  Please briefly explain why patient needs home oxygen: none needed

## 2014-05-25 LAB — BASIC METABOLIC PANEL
ANION GAP: 8 (ref 5–15)
BUN: 7 mg/dL (ref 6–23)
CHLORIDE: 104 meq/L (ref 96–112)
CO2: 24 mmol/L (ref 19–32)
Calcium: 8.2 mg/dL — ABNORMAL LOW (ref 8.4–10.5)
Creatinine, Ser: 0.85 mg/dL (ref 0.50–1.10)
GFR calc non Af Amer: 69 mL/min — ABNORMAL LOW (ref >90)
GFR, EST AFRICAN AMERICAN: 80 mL/min — AB (ref >90)
Glucose, Bld: 126 mg/dL — ABNORMAL HIGH (ref 70–99)
Potassium: 3.5 mmol/L (ref 3.5–5.1)
Sodium: 136 mmol/L (ref 135–145)

## 2014-05-25 MED ORDER — FUROSEMIDE 10 MG/ML IJ SOLN
40.0000 mg | Freq: Once | INTRAMUSCULAR | Status: AC
Start: 2014-05-25 — End: 2014-05-25
  Administered 2014-05-25: 40 mg via INTRAVENOUS
  Filled 2014-05-25: qty 4

## 2014-05-25 MED ORDER — CEFEPIME HCL 2 G IJ SOLR
2.0000 g | Freq: Two times a day (BID) | INTRAMUSCULAR | Status: DC
  Filled 2014-05-25: qty 2

## 2014-05-25 NOTE — Progress Notes (Signed)
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 The patient received chemotherapy on 05/11/2014. She received Neulasta on 05/12/2014 under guidance of Dr. Lindi Adie. On the morning of admission, the patient began experiencing fevers and chills that prompted her to come to the emergency department. The patient denies any headache, chest pain, shortness breath, coughing, hemoptysis, vomiting, abdominal pain, dysuria, hematuria. Notably, the patient began noticing some pain in her left lower gums. She went to see her dentist on 05/12/2014. The patient was placed on Augmentin. She only took it 2 days because of diarrhea and nausea. She states that even with the 2 days of antibiotics for gum swelling has improved. Because of her diarrhea, the patient was also placed on Flagyl 250 mg every 8 hours by her dentist. Again, she only took this 2 days because it caused nausea.    PT Comments    Noted pt to have increased edema bil LE/feet. Encouraged elevation of LEs when in bed but pt reports she has been having difficulty with bed operation-pt states RN is aware of this. Discussed d/c plan-pt is agreeable to ST SNF placement for continued rehab.   Follow Up Recommendations  SNF;Supervision/Assistance - 24 hour     Equipment Recommendations  Rolling walker with 5" wheels    Recommendations for Other Services       Precautions / Restrictions Precautions Precautions: Fall Precaution Comments: neutropenic precautions, enteric Restrictions Weight Bearing Restrictions: No    Mobility  Bed Mobility Overal bed mobility: Needs Assistance Bed Mobility: Supine to Sit     Supine to sit: Min guard      General bed mobility comments: close guard. Increased time. ~4-5 minutes rest break needed at EOB for pt to recover/proceed with activity. VCS for pursed lip breathing.  Transfers Overall transfer level: Needs assistance   Transfers: Sit to/from Stand Sit to Stand: Min guard         General transfer comment: close guard for safety.   Ambulation/Gait Ambulation/Gait assistance: Min guard Ambulation Distance (Feet): 200 Feet Assistive device: Rolling walker (2 wheeled) Gait Pattern/deviations: Step-through pattern;Decreased stride length     General Gait Details: Dyspnea 2-3/4. Pt fatigues fairly easily. close guard for safety.    Stairs            Wheelchair Mobility    Modified Rankin (Stroke Patients Only)       Balance           Standing balance support: Single extremity supported;Bilateral upper extremity supported;During functional activity Standing balance-Leahy Scale: Fair                      Cognition Arousal/Alertness: Awake/alert Behavior During Therapy: WFL for tasks assessed/performed Overall Cognitive Status: Within Functional Limits for tasks assessed                      Exercises      General Comments        Pertinent Vitals/Pain Pain Assessment: No/denies pain    Home Living                      Prior Function            PT Goals (  current goals can now be found in the care plan section) Progress towards PT goals: Progressing toward goals    Frequency  Min 3X/week    PT Plan Current plan remains appropriate    Co-evaluation             End of Session   Activity Tolerance: Patient limited by fatigue Patient left: in bed;with call bell/phone within reach     Time: 1041-1108 PT Time Calculation (min) (ACUTE ONLY): 27 min  Charges:  $Gait Training: 8-22 mins $Therapeutic Activity: 8-22 mins                    G Codes:      Weston Anna, MPT Pager: [phone]

## 2014-05-25 NOTE — Progress Notes (Signed)
CSW is assisting with d/c planning. Pt is considering ST Rehab following hospital d/c. She is interested in [facility] ( [facility] is able to offer a rehab bed. Pt will be responsible for cost ( $ 85.00 ) of transportation to chemo appts. ( if required ).  Werner Lean LCSW [phone]

## 2014-05-25 NOTE — Progress Notes (Signed)
ANTIBIOTIC CONSULT NOTE - Follow-Up  Pharmacy Consult for:  Vancomycin, Cefepime Indication:  Sepsis, Febrile Neutropenia  Allergies  Allergen Reactions  . Ciprofloxacin Nausea And Vomiting  . Demerol [Meperidine] Hives  . Lidocaine     palpatations  . Other Swelling    Eye ointments  . Septra [Sulfamethoxazole-Trimethoprim] Nausea And Vomiting  . Codeine Rash  . Novocain [Procaine] Palpitations    Patient Measurements: Height: 5\' 3"  (160 cm) Weight: 202 lb 9.6 oz (91.9 kg) IBW/kg (Calculated) : 52.4   Vital Signs: Temp: 98.4 F (36.9 C) (01/11 0700) Temp Source: Oral (01/11 0700) BP: 139/56 mmHg (01/11 0700) Pulse Rate: 105 (01/11 0700)  Labs:  Recent Labs  05/23/14 0435 05/24/14 0435  WBC  --  12.4*  HGB  --  7.6*  PLT  --  104*  CREATININE 0.92 0.79   Estimated Creatinine Clearance: 72.5 mL/min (by C-G formula based on Cr of 0.79).    Medical History: Past Medical History  Diagnosis Date  . Anxiety   . Back pain   . H/O bladder problems   . Cholelithiasis   . Depression   . High blood pressure   . Hypercholesteremia   . Kidney stones   . Gum disease   . Complication of anesthesia     bp goes up  . Wears glasses   . Wears dentures     top   Microbiology: 1/3 blood x 2: NGTD 1/4 urine: NGF 1/4 C.diff: Positive 1/4 MRSA PCR screen: negative  Antimicrobials: 1/3 >> Zosyn x 1 in ED 1/3 >> Vancomycin >> 1/4 >> Cefepime >> 1/5 >> Metronidazole (MD) >>  Antibiotic levels and dosage changes: 1/6: Vancomycin trough 16.5 on 750 mg IV q12h, changed to 1g IV q12h (see progress note)  Assessment: 69 y/o F with PMH of invasive ductal carcinoma of R breast s/p chemotherapy on 05/11/14 and Neulasta 05/12/14, HLD, depression, and periodontal disease who went to dentist on 05/12/14 with complaints of pain in L lower gums.  Patient was placed on Augmentin, but only took x 2 days d/t nausea and diarrhea.  Patient presented to ED on 05/17/14 with fevers and  chills and was found to be neutropenic with critical WBC result of 0.1.  Patient placed on broad spectrum antibiotics with Vancomycin and Cefepime, with pharmacy asked to assist with dosing. Flagyl added per MD on 1/5 for positive C.diff.  1/3 >> Zosyn x 1 in ED 1/3 >> Vancomycin >> 1/4 >> Cefepime >> 1/5 >> Metronidazole (MD) >>  Tmax:  afebrile since 1/6 (101.1 on adm) WBCs: increased, 12.4 (neutropenic on admission); last ANC 7.3 on 1/8; Neupogen 1/5 Renal:  SCr 0.79 stable, CrCl 72 CG; 77N  1/3 blood x 2: NGF 1/4 urine: NGF 1/4 C.diff: Positive 1/4 MRSA PCR: negative  Dose changes/drug level info:  1/6 0925 VT: 16.5 mcg/mL on 750 mg IV q12h, changed to 1g IV q12h (Note 1/5 2200 dose given 1/6 at 0056 (~3 hours late).  RN verified trough drawn PRIOR to hanging of dose (closer to ~ 0930)  1/8 0700 VT = 24.4 on 1g q12 (before 4th dose). Reduced to 750 q12h, next dose 1/9.  Reduced Cefepime to q12 dosing as no longer febrile/neutropenic since 1/7   Goals of Therapy:  Vancomycin trough levels 15-20 mcg/ml Appropriate antibiotic dosing for renal function and indication Eradication of infection  Plan:   Continue vancomycin 750 mg IV q12h for now; recommend stopping based on negative cultures and improved clinical picture.  Will hold off on checking another trough for now.  Reduce cefepime to 2g IV q12h as patient is no longer febrile or neutropenic for several days now.  Follow clinical course, renal function  Await word on planned duration of therapy and potential antibiotic de-escalation.  Reuel Boom, PharmD Pager: [phone] 05/25/2014, 10:38 AM

## 2014-05-25 NOTE — Progress Notes (Signed)
Patient ID: Cheyenne Riggs, female   DOB: 1945-08-01, 69 y.o.   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 Patient received chemotherapy on 05/11/2014 with Neulasta support on 05/12/2014 under the care of Dr. Lindi Adie. Patient did not have complaints of chest pain, shortness of breath or cough. There was no complaint of abdominal pain, nausea or vomiting. Off note, patient had pain in the left lower gum area and has seen her dentist 05/12/2014. She was placed on Augmentin but because of perfuse diarrhea patient only took it for 2 days. Because of diarrhea patient was also placed on Flagyl 250 mg every 8 hours by her dentist. Diarrhea has improved since the admission.  In ED, patient was hypotensive with blood pressure of 75/57. Blood pressure has improved with 1 L normal saline infusion to 123/80. Patient was tachypneic, an tachycardic, oxygen saturation of 90% with nasal cannula oxygen support and had a fever off 103.62F. She was started on broad-spectrum antibiotics for treatment of sepsis and febrile neutropenia. Chest x-ray was concerning for possible pneumonia.  Hospital course complicated with additional findings of C. difficile enteritis. Patient was started on Flagyl 05/19/2014. Repeat CXR on 1/10 showed persistent multilobar pneumonia vs atypical pneumonitis. We will complete a course of 7 days of antibiotics for the pneumonia with broad spectrum antibiotics.   Assessment/Plan:    Principal problem Acute respiratory failure with hypoxia / possible pneumonia  Hypoxia likely secondary to possible pneumonia as seen on chest x-ray.  Patient  was started on broad-spectrum antibiotics, vancomycin and cefepime, completed 6 days of antibiotics. Repeat CXR does not show any worsening. She is afebrile and her hypoxia has resolved. We have weaned her off oxygen.   Please note that patient also has C. difficile and we started Flagyl as well.  Blood culture results are still pending.  Respiratory status is stable at this time, oxygen saturation is 98%.  Repeat CXR today showed persistent pneumonia.   Active Problems: Sepsis secondary to possible pneumonia and C. difficile colitis  Sepsis criteria met on the admission with vital signs that included hypotension, tachycardia, tachypnea, slight hypoxia. In addition patient had fever. The white blood cell count on the admission was 0.1. Source of infection - pneumonia and c diff.   Please note that patient had complaints of diarrhea but this has improved since the admission. Stool was sent for C. difficile analysis and it was positive. He was started Flagyl 05/19/2014.  Febrile neutropenia  Likely secondary to sequela of recent chemotherapy.  Patient has received Neulasta support 05/12/2014.  Since white blood cell count still low at 0.2 we will give 1 dose of Neupogen today.  Patient's oncologist was informed of patient's admission.  Continue current antibiotic regimen, vancomycin and cefepime for possible pneumonia and Flagyl for C. difficile.  Breast cancer of upper-outer quadrant of right female breast  Patient has had recent chemotherapy 05/11/2014 with Neulasta support on 05/12/2014.  Anemia of chronic disease  Likely anemia of malignancy and sequela of chemotherapy  Hemoglobin is 7.1 this morning. We will transfuse 1 unit of PRBC . Repeat H&h is still around 7.4  Pancytopenia / thrombocytopenia  Likely secondary to sequela of chemotherapy.  Platelet count is 104 today , much improved.   Continue to monitor CBC daily.  Monitor for  bleeding.  Hypokalemia  Secondary to sepsis.  Supplement as needed.     DVT Prophylaxis   Stopped Lovenox and use SCDs for DVT prophylaxis because of thrombocytopenia and the risk of bleeding.  Code Status: Full.  Family Communication:  plan of care discussed with the patient Disposition Plan: Home when stable  IV access:  Peripheral IV  Procedures and diagnostic studies:    Dg Chest Port 1 View 06-07-2014    Mild vascular congestion noted; mild right basilar airspace opacity may reflect atelectasis or possibly pneumonia.   Medical Consultants:  Dr. Nicholas Lose, Oncology - informed of patient's admission   Other Consultants:  Nutrition Physical therapy   IAnti-Infectives:   Vancomycin 05/18/2014 -->  Cefepime 05/18/2014 --> Flagyl 05/19/2014 -->     Alanny Rivers, MD  [facility] Pager [phone]  If 7PM-7AM, please contact night-coverage [email or web address] Password [email or web address] 05/25/2014, 11:09 AM   LOS: 8 days    HPI/Subjective: No acute overnight events. Diarrhea has improved.   Objective: Filed Vitals:   05/23/14 2239 05/24/14 0542 05/24/14 1400 05/25/14 0700  BP: 142/78 144/50 147/67 139/56  Pulse: 92 98 109 105  Temp: 99.5 F (37.5 C) 98.3 F (36.8 C) 98.5 F (36.9 C) 98.4 F (36.9 C)  TempSrc: Oral Oral Oral Oral  Resp: _0 Height:      Weight:      SpO2: 98% 98% 96% 92%    Intake/Output Summary (Last 24 hours) at 05/25/14 1109 Last data filed at 05/25/14 1000  Gross per 24 hour  Intake    610 ml  Output    900 ml  Net   -290 ml    Exam:   General:  Pt is alert, follows commands appropriately, not in acute distress  Cardiovascular: Regular rate and rhythm, S1/S2 (+0  Respiratory: Clear to auscultation bilaterally, no wheezing, no crackles, no rhonchi  Abdomen: Soft, non tender, non distended, bowel sounds present  Extremities: trace LE edema, pulses DP and PT palpable bilaterally  Neuro: Grossly nonfocal  Data  Reviewed: Basic Metabolic Panel:  Recent Labs Lab 05/19/14 0415 05/20/14 0435 05/22/14 0645 05/23/14 0435 05/24/14 0435  NA 136 139 137 138 137  K 3.5 3.7 3.2* 3.7 3.4*  CL 105 107 105 107 108  CO2 _1 GLUCOSE 123* 97 125* 140* 120*  BUN _2 CREATININE 0.99 0.95 0.86 0.92 0.79  CALCIUM 7.7* 7.9* 8.1* 8.3* 8.2*  MG  --   --  1.5  --   --    Liver Function Tests: No results for input(s): AST, ALT, ALKPHOS, BILITOT, PROT, ALBUMIN in the last 168 hours. No results for input(s): LIPASE, AMYLASE in the last 168 hours. No results for input(s): AMMONIA in the last 168 hours. CBC:  Recent Labs Lab 05/19/14 0415 05/19/14 2351 05/21/14 1130 05/22/14 0645 05/24/14 0435  WBC 0.2*  --  4.8 8.8 12.4*  NEUTROABS  --   --  4.2 7.3  --   HGB 7.1* 7.4* 7.8* 7.5* 7.6*  HCT 21.3* 22.1* 22.8* 22.0* 22.7*  MCV 78.9  --  78.9 79.4 79.9  PLT 67*  --  46* 54* 104*   Cardiac Enzymes: No results for input(s): CKTOTAL, CKMB, CKMBINDEX, TROPONINI in the last 168 hours. BNP: Invalid input(s): POCBNP CBG: No results for input(s): GLUCAP in the last  168 hours.  Recent Results (from the past 240 hour(s))  MRSA PCR Screening     Status: None   Collection Time: 05/18/14 12:44 AM  Result Value Ref Range Status   MRSA by PCR NEGATIVE NEGATIVE Final  Urine culture     Status: None   Collection Time: 05/18/14  1:11 AM  Result Value Ref Range Status   Specimen Description URINE, CLEAN CATCH  Final   Special Requests NONE  Final   Colony Count NO GROWTH   Final   Report Status 05/19/2014 FINAL  Final  Clostridium Difficile by PCR     Status: Abnormal   Collection Time: 05/18/14  3:42 PM  Result Value Ref Range Status   C difficile by pcr POSITIVE (A) NEGATIVE Final     Scheduled Meds: . sodium chloride   Intravenous Once  . ceFEPime (MAXIPIME)   2 g Intravenous Q8H  . diphenhydrAMINE  25 mg Oral BID  . famotidine  20 mg Oral BID  . filgrastim (NEUPOGEN)   480 mcg  Intravenous ONCE-1800  . FLUoxetine  10 mg Oral Daily  . metronidazole  500 mg Intravenous Q8H  . sodium chloride  3 mL Intravenous Q12H  . vancomycin  750 mg Intravenous Q12H   Continuous Infusions:

## 2014-05-26 DIAGNOSIS — R600 Localized edema: Secondary | ICD-10-CM | POA: Diagnosis not present

## 2014-05-26 DIAGNOSIS — E876 Hypokalemia: Secondary | ICD-10-CM | POA: Diagnosis not present

## 2014-05-26 DIAGNOSIS — I10 Essential (primary) hypertension: Secondary | ICD-10-CM | POA: Diagnosis not present

## 2014-05-26 DIAGNOSIS — J189 Pneumonia, unspecified organism: Secondary | ICD-10-CM | POA: Diagnosis not present

## 2014-05-26 DIAGNOSIS — F329 Major depressive disorder, single episode, unspecified: Secondary | ICD-10-CM | POA: Diagnosis not present

## 2014-05-26 DIAGNOSIS — A047 Enterocolitis due to Clostridium difficile: Secondary | ICD-10-CM | POA: Diagnosis not present

## 2014-05-26 DIAGNOSIS — E441 Mild protein-calorie malnutrition: Secondary | ICD-10-CM | POA: Diagnosis not present

## 2014-05-26 DIAGNOSIS — R2689 Other abnormalities of gait and mobility: Secondary | ICD-10-CM | POA: Diagnosis not present

## 2014-05-26 DIAGNOSIS — D649 Anemia, unspecified: Secondary | ICD-10-CM | POA: Diagnosis not present

## 2014-05-26 DIAGNOSIS — E785 Hyperlipidemia, unspecified: Secondary | ICD-10-CM | POA: Diagnosis not present

## 2014-05-26 DIAGNOSIS — R197 Diarrhea, unspecified: Secondary | ICD-10-CM | POA: Diagnosis not present

## 2014-05-26 DIAGNOSIS — C50411 Malignant neoplasm of upper-outer quadrant of right female breast: Secondary | ICD-10-CM | POA: Diagnosis not present

## 2014-05-26 DIAGNOSIS — R6510 Systemic inflammatory response syndrome (SIRS) of non-infectious origin without acute organ dysfunction: Secondary | ICD-10-CM | POA: Diagnosis not present

## 2014-05-26 DIAGNOSIS — K056 Periodontal disease, unspecified: Secondary | ICD-10-CM | POA: Diagnosis not present

## 2014-05-26 DIAGNOSIS — D709 Neutropenia, unspecified: Secondary | ICD-10-CM | POA: Diagnosis not present

## 2014-05-26 LAB — BASIC METABOLIC PANEL
ANION GAP: 8 (ref 5–15)
BUN: 8 mg/dL (ref 6–23)
CHLORIDE: 104 meq/L (ref 96–112)
CO2: 26 mmol/L (ref 19–32)
CREATININE: 0.86 mg/dL (ref 0.50–1.10)
Calcium: 8.6 mg/dL (ref 8.4–10.5)
GFR calc non Af Amer: 68 mL/min — ABNORMAL LOW (ref >90)
GFR, EST AFRICAN AMERICAN: 79 mL/min — AB (ref >90)
Glucose, Bld: 116 mg/dL — ABNORMAL HIGH (ref 70–99)
POTASSIUM: 3.2 mmol/L — AB (ref 3.5–5.1)
SODIUM: 138 mmol/L (ref 135–145)

## 2014-05-26 LAB — CBC
HCT: 24 % — ABNORMAL LOW (ref 36.0–46.0)
Hemoglobin: 8 g/dL — ABNORMAL LOW (ref 12.0–15.0)
MCH: 26.8 pg (ref 26.0–34.0)
MCHC: 33.3 g/dL (ref 30.0–36.0)
MCV: 80.5 fL (ref 78.0–100.0)
Platelets: 202 10*3/uL (ref 150–400)
RBC: 2.98 MIL/uL — ABNORMAL LOW (ref 3.87–5.11)
RDW: 17.3 % — AB (ref 11.5–15.5)
WBC: 14.3 10*3/uL — AB (ref 4.0–10.5)

## 2014-05-26 MED ORDER — FLUTICASONE PROPIONATE 50 MCG/ACT NA SUSP: 1.0000 | Freq: Every day | NASAL | Status: DC

## 2014-05-26 MED ORDER — METRONIDAZOLE 250 MG PO TABS: 500.0000 mg | ORAL_TABLET | Freq: Three times a day (TID) | ORAL | Status: AC

## 2014-05-26 MED ORDER — FLUOXETINE HCL 10 MG PO CAPS: 10.0000 mg | ORAL_CAPSULE | Freq: Every day | ORAL | Status: AC

## 2014-05-26 MED ORDER — POTASSIUM CHLORIDE CRYS ER 20 MEQ PO TBCR
40.0000 meq | EXTENDED_RELEASE_TABLET | Freq: Two times a day (BID) | ORAL | Status: DC
  Administered 2014-05-26: 40 meq via ORAL
  Filled 2014-05-26 (×2): qty 2

## 2014-05-26 MED ORDER — HEPARIN SOD (PORK) LOCK FLUSH 100 UNIT/ML IV SOLN
500.0000 [IU] | INTRAVENOUS | Status: AC | PRN
  Administered 2014-05-26: 500 [IU]

## 2014-05-26 MED ORDER — POTASSIUM CHLORIDE CRYS ER 20 MEQ PO TBCR: 40.0000 meq | EXTENDED_RELEASE_TABLET | Freq: Two times a day (BID) | ORAL | Status: DC

## 2014-05-26 MED ORDER — FUROSEMIDE 10 MG/ML IJ SOLN
20.0000 mg | Freq: Once | INTRAMUSCULAR | Status: AC
  Administered 2014-05-26: 20 mg via INTRAVENOUS
  Filled 2014-05-26: qty 2

## 2014-05-26 MED ORDER — LORAZEPAM 0.5 MG PO TABS: 0.5000 mg | ORAL_TABLET | Freq: Three times a day (TID) | ORAL | Status: DC | PRN

## 2014-05-26 NOTE — Discharge Summary (Signed)
Physician Discharge Summary  Cheyenne Riggs XNT:[id number] DOB: 16-Nov-1945 DOA: 05/17/2014  PCP: Gennette Pac, MD  Admit date: 05/17/2014 Discharge date: 05/26/2014  Time spent: 30 minutes  Recommendations for Outpatient Follow-up:  1. Follow up with PCP in one week.   Discharge Diagnoses:  Active Problems:   Breast cancer of upper-outer quadrant of right female breast   Neutropenic fever   SIRS (systemic inflammatory response syndrome)   Diarrhea   Enteritis due to Clostridium difficile   Atypical pneumonia   Discharge Condition: improved.   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 Patient received chemotherapy on 05/11/2014 with Neulasta support on 05/12/2014 under the care of Dr. Lindi Adie. Patient did not have complaints of chest pain, shortness of breath or cough. There was no complaint of abdominal pain, nausea or vomiting. Off note, patient had pain in the left lower gum area and has seen her dentist 05/12/2014. She was placed on Augmentin but because of perfuse diarrhea patient only took it for 2 days. Because of diarrhea patient was also placed on Flagyl 250 mg every 8 hours by her dentist. Diarrhea has improved since the admission.  In ED, patient was hypotensive with blood pressure of 75/57. Blood pressure has improved with 1 L normal saline infusion to 123/80. Patient was tachypneic, an tachycardic, oxygen saturation of 90% with nasal cannula oxygen support and had a fever off 103.45F. She was started on broad-spectrum antibiotics for treatment of sepsis and febrile neutropenia. Chest x-ray was concerning for possible  pneumonia.  Hospital course complicated with additional findings of C. difficile enteritis. Patient was started on Flagyl 05/19/2014. Repeat CXR on 1/10 showed persistent multilobar pneumonia vs atypical pneumonitis. We will complete a course of 7 days of antibiotics for the pneumonia with broad spectrum antibiotics.  And she will complete the cours of the flagyl.   Hospital Course:  Acute respiratory failure with hypoxia / possible pneumonia  Hypoxia likely secondary to possible pneumonia as seen on chest x-ray.  Patient was started on broad-spectrum antibiotics, vancomycin and cefepime, completed 6 days of antibiotics. Repeat CXR does not show any worsening. She is afebrile and her hypoxia has resolved. We have weaned her off oxygen. Please note that patient also has C. difficile and we started Flagyl as well.  Blood culture results are still pending.  Respiratory status is stable at this time, oxygen saturation is 98%.  Repeat CXR today showed persistent pneumonia changes, but her hypoxia resolved.   Active Problems: Sepsis secondary to possible pneumonia and C. difficile colitis  Sepsis criteria met on the admission with vital signs that included hypotension, tachycardia, tachypnea, slight hypoxia. In addition patient had fever. The white blood cell count on the admission was 0.1. Source of infection - pneumonia and c diff.   Please note that patient had complaints of diarrhea but this has improved since the admission. Stool was sent for C. difficile analysis and it was positive. He was started Flagyl 05/19/2014.  Febrile neutropenia  Likely secondary to sequela of recent chemotherapy.  Patient has received Neulasta support 05/12/2014.  Patient's oncologist was informed of patient's admission.  Continue current antibiotic  regimen, vancomycin and cefepime compelted for pneumonia. and Flagyl for C. difficile.  Breast cancer of upper-outer quadrant of right female  breast  Patient has had recent chemotherapy 05/11/2014 with Neulasta support on 05/12/2014.  Anemia of chronic disease  Likely anemia of malignancy and sequela of chemotherapy  Hemoglobin is 7.1 this morning. We will transfuse 1 unit of PRBC . Repeat H&h is still around 7.4  Pancytopenia / thrombocytopenia  Likely secondary to sequela of chemotherapy.  Platelet count is 104 today , much improved.   Continue to monitor CBC daily.  Monitor for bleeding.  Hypokalemia  Secondary to sepsis.  Supplement as needed.  Procedures:  none  Consultations:  none  Discharge Exam: Filed Vitals:   05/26/14 0526  BP: 142/70  Pulse: 101  Temp: 98.3 F (36.8 C)  Resp: 18    General: alert afebrile comfortable Cardiovascular: s1s2 Respiratory: ctab  Discharge Instructions   Discharge Instructions    Discharge instructions    Complete by:  As directed   Follow up with oncology as recommended folllow up with bmp in 2 days.          Current Discharge Medication List    START taking these medications   Details  fluticasone (FLONASE) 50 MCG/ACT nasal spray Place 1 spray into both nostrils daily. Refills: 2    potassium chloride SA (K-DUR,KLOR-CON) 20 MEQ tablet Take 2 tablets (40 mEq total) by mouth 2 (two) times daily. Qty: 2 tablet, Refills: 0      CONTINUE these medications which have CHANGED   Details  metroNIDAZOLE (FLAGYL) 250 MG tablet Take 2 tablets (500 mg total) by mouth 3 (three) times daily. Qty: 30 tablet, Refills: 0      CONTINUE these medications which have NOT CHANGED   Details  acetaminophen (TYLENOL) 325 MG tablet Take 325 mg by mouth every 6 (six) hours as needed for moderate pain.     Calcium Carbonate-Simethicone (MAALOX ADVANCED MAX ST) 1000-60 MG CHEW Chew 1 tablet by mouth every 6 (six) hours as needed. Heart burn    cholecalciferol (VITAMIN D) 1000 UNITS tablet Take 1,000 Units by mouth daily.     diphenhydrAMINE (BENADRYL) 25 MG  tablet Take 25 mg by mouth 2 (two) times daily.    FLUoxetine (PROZAC) 10 MG capsule Take 10 mg by mouth daily.    lidocaine-prilocaine (EMLA) cream Apply 1 application topically as needed. Qty: 30 g, Refills: 6   Associated Diagnoses: Malignant neoplasm of upper-outer quadrant of female breast, right    loperamide (IMODIUM) 2 MG capsule Take 2 mg by mouth daily as needed for diarrhea or loose stools.    LORazepam (ATIVAN) 0.5 MG tablet Take 0.5 mg by mouth every 8 (eight) hours as needed. Nausea    Probiotic Product (PROBIOTIC DAILY PO) Take 1 tablet by mouth daily.     ranitidine (ZANTAC) 150 MG tablet Take 150 mg by mouth 2 (two) times daily as needed for heartburn.     ondansetron (ZOFRAN) 8 MG tablet Take 1 tablet (8 mg total) by mouth 2 (two) times daily as needed. Start on the third day after chemotherapy. Qty: 30 tablet, Refills: 1   Associated Diagnoses: Malignant neoplasm of upper-outer quadrant of female breast, right      STOP taking these medications     amoxicillin-clavulanate (AUGMENTIN) 500-125 MG per tablet      dexamethasone (DECADRON) 4 MG tablet      UNABLE TO FIND        Allergies  Allergen Reactions  . Ciprofloxacin Nausea And Vomiting  . Demerol [Meperidine] Hives  . Lidocaine     palpatations  . Other Swelling    Eye ointments  . Septra [Sulfamethoxazole-Trimethoprim] Nausea And Vomiting  . Codeine Rash  . Novocain [Procaine] Palpitations   Follow-up Information    Follow up with Gennette Pac, MD. Schedule an appointment as soon as possible for a visit in 1 week.   Specialty:  Family Medicine   Contact information:   [place] [place] [address] [phone]        The results of significant diagnostics from this hospitalization (including imaging, microbiology, ancillary and laboratory) are listed below for reference.    Significant Diagnostic Studies: Dg Chest 2 View  05/24/2014   CLINICAL DATA:  69 year old female  with recent diagnosis of pneumonia. Cough and congestion.  EXAM: CHEST  2 VIEW  COMPARISON:  Chest x-ray 05/21/2014.  FINDINGS: Right internal discretion at right subclavian single-lumen porta cath with tip terminating in the mid superior vena cava. Lung volumes are low. There are some bibasilar opacities which are favored to reflect areas of subsegmental atelectasis. Underlying airspace consolidation in the lower lobes is difficult to entirely exclude, but is not strongly favored. Small bilateral pleural effusions are unchanged. Diffuse interstitial prominence in lungs bilaterally similar to examination from 05/21/2014. Heart size is borderline enlarged. Upper mediastinal contours are within normal limits.  IMPRESSION: 1. The appearance the chest is very similar to the prior examination, most concerning for widespread multilobar pneumonia, likely from viral or other atypical organism. 2. Small bilateral pleural effusions with mild bibasilar subsegmental atelectasis.   Electronically Signed   By: Vinnie Langton M.D.   On: 05/24/2014 14:20   Dg Chest 2 View  05/21/2014   CLINICAL DATA:  Cough and congestion for 3 days  EXAM: CHEST  2 VIEW  COMPARISON:  05/17/2014  FINDINGS: Cardiac shadow is stable. Increased density is noted in the medial right lung base projecting in the right lower lobe on the lateral projection consistent with acute infiltrate. A right chest wall port is again seen and stable. No other focal abnormality is noted.  IMPRESSION: Right lower lobe pneumonia new from the prior exam.   Electronically Signed   By: Inez Catalina M.D.   On: 05/21/2014 15:45   Dg Chest Port 1 View  05/17/2014   CLINICAL DATA:  Acute onset of vomiting, nausea and diarrhea. Fever. Initial encounter.  EXAM: PORTABLE CHEST - 1 VIEW  COMPARISON:  Chest radiograph performed 02/27/2014  FINDINGS: The lungs are well-aerated. Mild vascular congestion is noted. Mild right basilar opacity may reflect atelectasis or possibly  pneumonia. There is no evidence of pleural effusion or pneumothorax.  The cardiomediastinal silhouette is within normal limits. No acute osseous abnormalities are seen. The right-sided chest port is noted ending about the mid SVC. Scattered clips are seen overlying the right breast.  IMPRESSION: Mild vascular congestion noted; mild right basilar airspace opacity may reflect atelectasis or possibly pneumonia.   Electronically Signed   By: Garald Balding M.D.   On: 05/17/2014 21:12    Microbiology: Recent Results (from the past 240 hour(s))  Blood Culture (routine x 2)     Status: None   Collection Time: 05/17/14  8:53 PM  Result Value Ref Range Status   Specimen Description BLOOD LEFT ARM  Final   Special Requests BOTTLES DRAWN AEROBIC AND ANAEROBIC 3CC  Final   Culture   Final    NO  GROWTH 5 DAYS Performed at [facility]    Report Status 05/24/2014 FINAL  Final  Blood Culture (routine x 2)     Status: None   Collection Time: 05/17/14  8:56 PM  Result Value Ref Range Status   Specimen Description BLOOD LEFT HAND  Final   Special Requests BOTTLES DRAWN AEROBIC ONLY 3CC  Final   Culture   Final    NO GROWTH 5 DAYS Note: Culture results may be compromised due to an inadequate volume of blood received in culture bottles. Performed at [facility]    Report Status 05/24/2014 FINAL  Final  MRSA PCR Screening     Status: None   Collection Time: 05/18/14 12:44 AM  Result Value Ref Range Status   MRSA by PCR NEGATIVE NEGATIVE Final    Comment:        The GeneXpert MRSA Assay (FDA approved for NASAL specimens only), is one component of a comprehensive MRSA colonization surveillance program. It is not intended to diagnose MRSA infection nor to guide or monitor treatment for MRSA infections. Performed at [facility]   Urine culture     Status: None   Collection Time: 05/18/14  1:11 AM  Result Value Ref Range Status   Specimen Description URINE, CLEAN CATCH   Final   Special Requests NONE  Final   Colony Count NO GROWTH Performed at [facility]   Final   Culture NO GROWTH Performed at [facility]   Final   Report Status 05/19/2014 FINAL  Final  Clostridium Difficile by PCR     Status: Abnormal   Collection Time: 05/18/14  3:42 PM  Result Value Ref Range Status   C difficile by pcr POSITIVE (A) NEGATIVE Final    Comment: CRITICAL RESULT CALLED TO, READ BACK BY AND VERIFIED WITH: Roselle Locus AT 3662 05/19/14 BY K BARR Performed at [facility]: Basic Metabolic Panel:  Recent Labs Lab 05/22/14 0645 05/23/14 0435 05/24/14 0435 05/25/14 1223 05/26/14 0445  NA 137 138 137 136 138  K 3.2* 3.7 3.4* 3.5 3.2*  CL 105 107 108 104 104  CO2 _0 GLUCOSE 125* 140* 120* 126* 116*  BUN _1 CREATININE 0.86 0.92 0.79 0.85 0.86  CALCIUM 8.1* 8.3* 8.2* 8.2* 8.6  MG 1.5  --   --   --   --    Liver Function Tests: No results for input(s): AST, ALT, ALKPHOS, BILITOT, PROT, ALBUMIN in the last 168 hours. No results for input(s): LIPASE, AMYLASE in the last 168 hours. No results for input(s): AMMONIA in the last 168 hours. CBC:  Recent Labs Lab 05/19/14 2351 05/21/14 1130 05/22/14 0645 05/24/14 0435 05/26/14 0445  WBC  --  4.8 8.8 12.4* 14.3*  NEUTROABS  --  4.2 7.3  --   --   HGB 7.4* 7.8* 7.5* 7.6* 8.0*  HCT 22.1* 22.8* 22.0* 22.7* 24.0*  MCV  --  78.9 79.4 79.9 80.5  PLT  --  46* 54* 104* 202   Cardiac Enzymes: No results for input(s): CKTOTAL, CKMB, CKMBINDEX, TROPONINI in the last 168 hours. BNP: BNP (last 3 results) No results for input(s): PROBNP in the last 8760 hours. CBG: No results for input(s): GLUCAP in the last 168 hours.     SignedHosie Poisson  [facility] 05/26/2014, 12:23 PM

## 2014-05-26 NOTE — Progress Notes (Signed)
Porta cath deaccessed. Flushed with 10cc NS followed by Heparin 5ml (100u/ml). No bleeding to site, band aid to site for comfort. Cheyenne Riggs 

## 2014-05-26 NOTE — Progress Notes (Signed)
Clinical Social Work Department CLINICAL SOCIAL WORK PLACEMENT NOTE 05/26/2014  Patient:  Cheyenne Riggs, Cheyenne Riggs  Account Number:  [id number] Admit date:  05/17/2014  Clinical Social Worker:  Renold Genta  Date/time:  05/19/2014 04:38 PM  Clinical Social Work is seeking post-discharge placement for this patient at the following level of care:   SKILLED NURSING   (*CSW will update this form in Epic as items are completed)   05/19/2014  Patient/family provided with [place] Department of Clinical Social Work's list of facilities offering this level of care within the geographic area requested by the patient (or if unable, by the patient's family).  05/19/2014  Patient/family informed of their freedom to choose among providers that offer the needed level of care, that participate in Medicare, Medicaid or managed care program needed by the patient, have an available bed and are willing to accept the patient.  05/19/2014  Patient/family informed of [facility]' ownership interest in [facility], as well as of the fact that they are under no obligation to receive care at this facility.  PASARR submitted to EDS on 05/19/2014 PASARR number received on 05/19/2014  FL2 transmitted to all facilities in geographic area requested by pt/family on  05/19/2014 FL2 transmitted to all facilities within larger geographic area on   Patient informed that his/her managed care company has contracts with or will negotiate with  certain facilities, including the following:     Patient/family informed of bed offers received:  05/19/2014 Patient chooses bed at [facility] [place] Physician recommends and patient chooses bed at    Patient to be transferred to [place] on  05/26/2014 Patient to be transferred to facility by [place] Patient and family notified of transfer on 05/26/2014 Name of family member notified:  SON  The following physician  request were entered in Epic:   Additional Comments: Pt / son are in agreement with d/c to SNF today. Pt able to transport by car. NSG reviewed d/c summary, scripts, avs. Scripts included in d/c packet. Packet provided to pt prior to d/c.  Werner Lean LCSW [phone]

## 2014-05-31 DIAGNOSIS — A047 Enterocolitis due to Clostridium difficile: Secondary | ICD-10-CM | POA: Diagnosis not present

## 2014-05-31 DIAGNOSIS — I10 Essential (primary) hypertension: Secondary | ICD-10-CM | POA: Diagnosis not present

## 2014-05-31 DIAGNOSIS — R2689 Other abnormalities of gait and mobility: Secondary | ICD-10-CM | POA: Diagnosis not present

## 2014-05-31 DIAGNOSIS — D649 Anemia, unspecified: Secondary | ICD-10-CM | POA: Diagnosis not present

## 2014-05-31 DIAGNOSIS — E441 Mild protein-calorie malnutrition: Secondary | ICD-10-CM | POA: Diagnosis not present

## 2014-05-31 DIAGNOSIS — R600 Localized edema: Secondary | ICD-10-CM | POA: Diagnosis not present

## 2014-05-31 DIAGNOSIS — E876 Hypokalemia: Secondary | ICD-10-CM | POA: Diagnosis not present

## 2014-06-02 ENCOUNTER — Telehealth: Payer: Self-pay | Admitting: *Deleted

## 2014-06-02 NOTE — Telephone Encounter (Signed)
MESSAGE LEFT ON VM IN TRIAGE BY PT REQUESTING A RETURN CALL ABOUT DIARRHEA .  This RN returned call to pt.  She gave lengthy history of C Diff and situation of use of oral vanco and other meds given at [facility] post discharge from hospital.  Diarrhea was getting better and she left [facility].  Currently Flagyl TID.  Pt was also potassium and iron by Dr Sharlett Iles - facility MD at [facility] upon discharge.  She has post getting home had 2 loose stools " one really bad ".  Overall pt is concerned due to return of diarrhea as stated above.  " could it be the iron or potassium ?"  Pt is not using any anti diarrheal " they told me not to at [facility] "  She is taking a probiotic- Align.  Pt is able to drink well and maintain hydration.  Pt is not sure who should advise her per above since she was discharged by a hospitalist.  Return call number given as [phone].  THIS NOTE WILL BE SENT TO MD AND RN AT DESK FOR FOLLOW UP. PT UNDERSTANDS SHE WILL NOT GET A RETURN CALL UNTIL 06/03/2014.

## 2014-06-04 ENCOUNTER — Telehealth: Payer: Self-pay | Admitting: *Deleted

## 2014-06-04 NOTE — Telephone Encounter (Signed)
Called and spoke with Janyce Llanos, Education officer, museum at [employer]. She had started referral for home health and will complete process. She told me that they were unable to complete testing for O2 need in the hospital as well as at [facility] her O2 saturation level did not meet criteria. Stated that she would have home health do the O2 testing again. Did state that home health may not get out to see her until Monday. Lemmie Evens that Dr. Lindi Adie can be contacted for orders.

## 2014-06-04 NOTE — Telephone Encounter (Signed)
THIS MORNING PT. WAS SHORT OF BREATH WHEN SHE WENT TO THE BATHROOM. 02 SAT WENT DOWN TO 48% BUT RETURNED TO 93%. PT. WOULD LIKE A HOME HEALTH REFERRAL AND O2.

## 2014-06-04 NOTE — Telephone Encounter (Signed)
Called patient to inform her that process was in place for home health but that they probably would not be out to see her until Monday. Stated that they would do the testing to validate her need for home O2. I told patient to limit her activities but if her breathing got worse then to call 911. Patient has appts for Monday and advised to keep them at this point but to let us know if things got worse. Patient verbalized understanding.

## 2014-06-05 ENCOUNTER — Telehealth: Payer: Self-pay | Admitting: *Deleted

## 2014-06-05 NOTE — Telephone Encounter (Signed)
Home health nurse Dahlia Byes) called canceling lab appointment for patient Cheyenne Riggs due to inclement weather. Informed nurse that patient has another lab/md/infusion appointment due 06/08/14. If patient is not able to make the appointments due to inclement weather than notify cancer center.

## 2014-06-07 DIAGNOSIS — C50411 Malignant neoplasm of upper-outer quadrant of right female breast: Secondary | ICD-10-CM | POA: Diagnosis not present

## 2014-06-07 DIAGNOSIS — Z9181 History of falling: Secondary | ICD-10-CM | POA: Diagnosis not present

## 2014-06-07 DIAGNOSIS — D630 Anemia in neoplastic disease: Secondary | ICD-10-CM | POA: Diagnosis not present

## 2014-06-07 DIAGNOSIS — M6281 Muscle weakness (generalized): Secondary | ICD-10-CM | POA: Diagnosis not present

## 2014-06-07 DIAGNOSIS — A047 Enterocolitis due to Clostridium difficile: Secondary | ICD-10-CM | POA: Diagnosis not present

## 2014-06-07 DIAGNOSIS — K219 Gastro-esophageal reflux disease without esophagitis: Secondary | ICD-10-CM | POA: Diagnosis not present

## 2014-06-07 DIAGNOSIS — Z9011 Acquired absence of right breast and nipple: Secondary | ICD-10-CM | POA: Diagnosis not present

## 2014-06-07 DIAGNOSIS — Z8701 Personal history of pneumonia (recurrent): Secondary | ICD-10-CM | POA: Diagnosis not present

## 2014-06-07 DIAGNOSIS — I10 Essential (primary) hypertension: Secondary | ICD-10-CM | POA: Diagnosis not present

## 2014-06-07 DIAGNOSIS — F329 Major depressive disorder, single episode, unspecified: Secondary | ICD-10-CM | POA: Diagnosis not present

## 2014-06-07 DIAGNOSIS — F419 Anxiety disorder, unspecified: Secondary | ICD-10-CM | POA: Diagnosis not present

## 2014-06-08 ENCOUNTER — Ambulatory Visit (HOSPITAL_BASED_OUTPATIENT_CLINIC_OR_DEPARTMENT_OTHER): Payer: Medicare Other | Admitting: Hematology and Oncology

## 2014-06-08 ENCOUNTER — Telehealth: Payer: Self-pay | Admitting: *Deleted

## 2014-06-08 ENCOUNTER — Telehealth: Payer: Self-pay | Admitting: Hematology and Oncology

## 2014-06-08 ENCOUNTER — Ambulatory Visit: Payer: Medicare Other

## 2014-06-08 ENCOUNTER — Other Ambulatory Visit (HOSPITAL_BASED_OUTPATIENT_CLINIC_OR_DEPARTMENT_OTHER): Payer: Medicare Other

## 2014-06-08 VITALS — BP 111/55 | HR 107 | Temp 97.5°F | Resp 18 | Ht 63.0 in | Wt 183.4 lb

## 2014-06-08 DIAGNOSIS — C50411 Malignant neoplasm of upper-outer quadrant of right female breast: Secondary | ICD-10-CM | POA: Diagnosis not present

## 2014-06-08 DIAGNOSIS — R110 Nausea: Secondary | ICD-10-CM

## 2014-06-08 DIAGNOSIS — D6481 Anemia due to antineoplastic chemotherapy: Secondary | ICD-10-CM | POA: Diagnosis not present

## 2014-06-08 LAB — CBC WITH DIFFERENTIAL/PLATELET
BASO%: 0.8 % (ref 0.0–2.0)
Basophils Absolute: 0.1 10*3/uL (ref 0.0–0.1)
EOS%: 0.5 % (ref 0.0–7.0)
Eosinophils Absolute: 0 10*3/uL (ref 0.0–0.5)
HCT: 32.4 % — ABNORMAL LOW (ref 34.8–46.6)
HGB: 10.1 g/dL — ABNORMAL LOW (ref 11.6–15.9)
LYMPH#: 0.6 10*3/uL — AB (ref 0.9–3.3)
LYMPH%: 6.7 % — ABNORMAL LOW (ref 14.0–49.7)
MCH: 26.3 pg (ref 25.1–34.0)
MCHC: 31.3 g/dL — ABNORMAL LOW (ref 31.5–36.0)
MCV: 84.1 fL (ref 79.5–101.0)
MONO#: 1 10*3/uL — ABNORMAL HIGH (ref 0.1–0.9)
MONO%: 11.1 % (ref 0.0–14.0)
NEUT#: 7.1 10*3/uL — ABNORMAL HIGH (ref 1.5–6.5)
NEUT%: 80.9 % — ABNORMAL HIGH (ref 38.4–76.8)
PLATELETS: 334 10*3/uL (ref 145–400)
RBC: 3.85 10*6/uL (ref 3.70–5.45)
RDW: 21.3 % — AB (ref 11.2–14.5)
WBC: 8.8 10*3/uL (ref 3.9–10.3)

## 2014-06-08 LAB — COMPREHENSIVE METABOLIC PANEL (CC13)
ALBUMIN: 2.6 g/dL — AB (ref 3.5–5.0)
ALT: 9 U/L (ref 0–55)
ANION GAP: 12 meq/L — AB (ref 3–11)
AST: 20 U/L (ref 5–34)
Alkaline Phosphatase: 73 U/L (ref 40–150)
BUN: 7.9 mg/dL (ref 7.0–26.0)
CALCIUM: 9.1 mg/dL (ref 8.4–10.4)
CHLORIDE: 105 meq/L (ref 98–109)
CO2: 21 mEq/L — ABNORMAL LOW (ref 22–29)
CREATININE: 1.1 mg/dL (ref 0.6–1.1)
EGFR: 50 mL/min/{1.73_m2} — ABNORMAL LOW (ref >90)
Glucose: 137 mg/dl (ref 70–140)
Potassium: 4.8 mEq/L (ref 3.5–5.1)
Sodium: 137 mEq/L (ref 136–145)
TOTAL PROTEIN: 6 g/dL — AB (ref 6.4–8.3)
Total Bilirubin: 0.5 mg/dL (ref 0.20–1.20)

## 2014-06-08 NOTE — Telephone Encounter (Signed)
, °

## 2014-06-08 NOTE — Telephone Encounter (Signed)
Per staff message and POF I have scheduled appts. Advised scheduler of appts. JMW  

## 2014-06-08 NOTE — Progress Notes (Signed)
Patient Care Team: Hulan Fess, MD as PCP - General (Family Medicine)  DIAGNOSIS: Breast cancer of upper-outer quadrant of right female breast   Staging form: Breast, AJCC 7th Edition     Clinical: Stage IA (T1c, N0, cM0) - Signed by Thea Silversmith, MD on 02/18/2014     Pathologic: Stage IIA (T2, N0, cM0) - Signed by Rulon Eisenmenger, MD on 03/12/2014   SUMMARY OF ONCOLOGIC HISTORY:   Breast cancer of upper-outer quadrant of right female breast   02/27/2014 Surgery Right breast lumpectomy: Invasive ductal carcinoma grade 3, 2.5 cm, DCIS intermediate grade, margins negative, 2 SLN negative, ER/PR HER-2 negative   03/30/2014 -  Chemotherapy Adjuvant chemotherapy with dose dense Adriamycin Cytoxan x4 followed by Abraxane weekly x12   05/17/2014 - 05/26/2014 Hospital Admission Neutropenic fever secondary to dental infection which led to C. difficile enterocolitis, atypical pneumonia and SIRS    CHIEF COMPLIANT: Follow-up after discharge from the hospital  INTERVAL HISTORY: Cheyenne Riggs is a 69 year old lady with above-mentioned history of right-sided breast cancer treated with adjuvant Adriamycin and Cytoxan. After cycle 42 was hospitalized with neutropenic fever secondary to a dental infection which led to C. difficile enterocolitis atypical pneumonia and severe hypotension. She was treated in the hospital with antibiotics and Flagyl. She spent a few days at the rehabilitation center and is back home last Sunday. She appears to be slowly improving. Her leg edema has resolved. She is eating better. Diarrhea has subsided 3 days ago. She is scheduled to finish up her Flagyl in the next 3 days. Continues to have mild nausea for which she takes antiemetics.  REVIEW OF SYSTEMS:   Constitutional: Fatigued and weak Eyes: Denies blurriness of vision Ears, nose, mouth, throat, and face: Denies mucositis or sore throat Respiratory: Denies cough, dyspnea or wheezes Cardiovascular: Denies palpitation, chest  discomfort or lower extremity swelling Gastrointestinal: Diarrhea finally subsided, complains of nausea Skin: Denies abnormal skin rashes Lymphatics: Denies new lymphadenopathy or easy bruising Neurological: Lower extremity weakness Behavioral/Psych: Mood is stable, no new changes  All other systems were reviewed with the patient and are negative.  I have reviewed the past medical history, past surgical history, social history and family history with the patient and they are unchanged from previous note.  ALLERGIES:  is allergic to ciprofloxacin; demerol; lidocaine; other; septra; codeine; and novocain.  MEDICATIONS:  Current Outpatient Prescriptions  Medication Sig Dispense Refill  . dexamethasone (DECADRON) 4 MG tablet   1  . FLUoxetine (PROZAC) 10 MG capsule Take 1 capsule (10 mg total) by mouth daily. 30 capsule 1  . iron polysaccharides (NIFEREX) 150 MG capsule Take 150 mg by mouth daily.    . metroNIDAZOLE (FLAGYL) 500 MG tablet Take 500 mg by mouth 3 (three) times daily.    . ondansetron (ZOFRAN) 8 MG tablet Take 1 tablet (8 mg total) by mouth 2 (two) times daily as needed. Start on the third day after chemotherapy. 30 tablet 1  . potassium chloride SA (K-DUR,KLOR-CON) 20 MEQ tablet Take 2 tablets (40 mEq total) by mouth 2 (two) times daily. 2 tablet 0  . Probiotic Product (PROBIOTIC DAILY PO) Take 1 tablet by mouth daily.     . ranitidine (ZANTAC) 150 MG tablet Take 150 mg by mouth 2 (two) times daily as needed for heartburn.     . simethicone (MYLICON) 80 MG chewable tablet Chew 80 mg by mouth every 6 (six) hours as needed for flatulence.    Marland Kitchen acetaminophen (TYLENOL) 325 MG  tablet Take 325 mg by mouth every 6 (six) hours as needed for moderate pain.     . Calcium Carbonate-Simethicone (MAALOX ADVANCED MAX ST) 1000-60 MG CHEW Chew 1 tablet by mouth every 6 (six) hours as needed. Heart burn    . cholecalciferol (VITAMIN D) 1000 UNITS tablet Take 1,000 Units by mouth daily.     .  diphenhydrAMINE (BENADRYL) 25 MG tablet Take 25 mg by mouth 2 (two) times daily.    . fluticasone (FLONASE) 50 MCG/ACT nasal spray Place 1 spray into both nostrils daily. (Patient not taking: Reported on 06/08/2014)  2  . lidocaine-prilocaine (EMLA) cream Apply 1 application topically as needed. (Patient not taking: Reported on 06/08/2014) 30 g 6  . loperamide (IMODIUM) 2 MG capsule Take 2 mg by mouth daily as needed for diarrhea or loose stools.    Marland Kitchen LORazepam (ATIVAN) 0.5 MG tablet Take 1 tablet (0.5 mg total) by mouth every 8 (eight) hours as needed. Nausea (Patient not taking: Reported on 06/08/2014) 30 tablet 0  . prochlorperazine (COMPAZINE) 10 MG tablet   1  . valsartan-hydrochlorothiazide (DIOVAN-HCT) 320-25 MG per tablet Take 1 tablet by mouth daily.  0   No current facility-administered medications for this visit.    PHYSICAL EXAMINATION: ECOG PERFORMANCE STATUS: 2 - Symptomatic, <50% confined to bed  Filed Vitals:   06/08/14 0933  BP: 111/55  Pulse: 107  Temp: 97.5 F (36.4 C)  Resp: 18   Filed Weights   06/08/14 0933  Weight: 183 lb 6.4 oz (83.19 kg)    GENERAL:alert, no distress and comfortable SKIN: skin color, texture, turgor are normal, no rashes or significant lesions EYES: normal, Conjunctiva are pink and non-injected, sclera clear OROPHARYNX:no exudate, no erythema and lips, buccal mucosa, and tongue normal  NECK: supple, thyroid normal size, non-tender, without nodularity LYMPH:  no palpable lymphadenopathy in the cervical, axillary or inguinal LUNGS: clear to auscultation and percussion with normal breathing effort HEART: Tachycardia ABDOMEN:abdomen soft, non-tender and normal bowel sounds Musculoskeletal:no cyanosis of digits and no clubbing  NEURO: alert & oriented x 3 with fluent speech, lower extremity weakness  LABORATORY DATA:  I have reviewed the data as listed   Chemistry      Component Value Date/Time   NA 137 06/08/2014 0905   NA 138 05/26/2014  0445   K 4.8 06/08/2014 0905   K 3.2* 05/26/2014 0445   CL 104 05/26/2014 0445   CO2 21* 06/08/2014 0905   CO2 26 05/26/2014 0445   BUN 7.9 06/08/2014 0905   BUN 8 05/26/2014 0445   CREATININE 1.1 06/08/2014 0905   CREATININE 0.86 05/26/2014 0445      Component Value Date/Time   CALCIUM 9.1 06/08/2014 0905   CALCIUM 8.6 05/26/2014 0445   ALKPHOS 73 06/08/2014 0905   ALKPHOS 75 05/17/2014 2054   AST 20 06/08/2014 0905   AST 18 05/17/2014 2054   ALT 9 06/08/2014 0905   ALT 20 05/17/2014 2054   BILITOT 0.50 06/08/2014 0905   BILITOT 1.0 05/17/2014 2054       Lab Results  Component Value Date   WBC 8.8 06/08/2014   HGB 10.1* 06/08/2014   HCT 32.4* 06/08/2014   MCV 84.1 06/08/2014   PLT 334 06/08/2014   NEUTROABS 7.1* 06/08/2014    ASSESSMENT & PLAN:  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 Patient started systemic adjuvant chemotherapy with dose dense Adriamycin Cytoxan. She started chemotherapy in 03/30/2014. Dose of chemotherapy was reduced after cycle 1 for severe neutropenia. Today is cycle 3 day 9.   Chemotoxicities: 1. Neutropenia grade 4: Required dose reduction for cycle 2 of chemotherapy and for cycle 4 AC, hospitalization for neutropenic fever after fourth cycle of AC 2. Alopecia 3. Chemotherapy-induced nausea :  4. Chemotherapy-induced diarrhea: Patient went on to get C. difficile enterocolitis and was treated with Flagyl. 5. Chemotherapy-induced severe fatigue 6. Chemotherapy-induced anemia: Being observed 7. Hospitalization for neutropenic fever, dental infection with C. difficile colitis  Plan: 1. Postpone Abraxane chemotherapy by 2 weeks 2. Reduce potassium supplementation to 40 mg from 80 mg daily 3. Improve nutrition and participate with PT OT to improve her physical strength Return to  clinic in 2 weeks for follow-up and to start Abraxane chemotherapy.    No orders of the defined types were placed in this encounter.   The patient has a good understanding of the overall plan. she agrees with it. She will call with any problems that may develop before her next visit here.   Rulon Eisenmenger, MD

## 2014-06-08 NOTE — Assessment & Plan Note (Addendum)
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 Patient started systemic adjuvant chemotherapy with dose dense Adriamycin Cytoxan. She started chemotherapy in 03/30/2014. Dose of chemotherapy was reduced after cycle 1 for severe neutropenia. Today is cycle 3 day 9.   Chemotoxicities: 1. Neutropenia grade 4: Required dose reduction for cycle 2 of chemotherapy and for cycle 4 AC, hospitalization for neutropenic fever after fourth cycle of AC 2. Alopecia 3. Chemotherapy-induced nausea :  4. Chemotherapy-induced diarrhea: Patient went on to get C. difficile enterocolitis and was treated with Flagyl. 5. Chemotherapy-induced severe fatigue 6. Chemotherapy-induced anemia: Being observed 7. Hospitalization for neutropenic fever, dental infection with C. difficile colitis  Plan: 1. Postpone Abraxane chemotherapy by 2 weeks 2. Reduce potassium supplementation to 40 mg from 80 mg daily 3. Improve nutrition and participate with PT OT to improve her physical strength Return to clinic in 2 weeks for follow-up and to start Abraxane chemotherapy.

## 2014-06-09 ENCOUNTER — Telehealth: Payer: Self-pay | Admitting: *Deleted

## 2014-06-09 DIAGNOSIS — I10 Essential (primary) hypertension: Secondary | ICD-10-CM | POA: Diagnosis not present

## 2014-06-09 DIAGNOSIS — M6281 Muscle weakness (generalized): Secondary | ICD-10-CM | POA: Diagnosis not present

## 2014-06-09 DIAGNOSIS — D630 Anemia in neoplastic disease: Secondary | ICD-10-CM | POA: Diagnosis not present

## 2014-06-09 DIAGNOSIS — C50411 Malignant neoplasm of upper-outer quadrant of right female breast: Secondary | ICD-10-CM | POA: Diagnosis not present

## 2014-06-09 DIAGNOSIS — A047 Enterocolitis due to Clostridium difficile: Secondary | ICD-10-CM | POA: Diagnosis not present

## 2014-06-09 DIAGNOSIS — F329 Major depressive disorder, single episode, unspecified: Secondary | ICD-10-CM | POA: Diagnosis not present

## 2014-06-09 NOTE — Telephone Encounter (Signed)
Received VM from [place], physical therapist, with [employer]. He just wanted to let us know that home care will follow for PT and then patient will resume outpatient PT in several weeks and that our office might be getting orders for PT faxed over.

## 2014-06-11 DIAGNOSIS — M6281 Muscle weakness (generalized): Secondary | ICD-10-CM | POA: Diagnosis not present

## 2014-06-11 DIAGNOSIS — D630 Anemia in neoplastic disease: Secondary | ICD-10-CM | POA: Diagnosis not present

## 2014-06-11 DIAGNOSIS — C50411 Malignant neoplasm of upper-outer quadrant of right female breast: Secondary | ICD-10-CM | POA: Diagnosis not present

## 2014-06-11 DIAGNOSIS — F329 Major depressive disorder, single episode, unspecified: Secondary | ICD-10-CM | POA: Diagnosis not present

## 2014-06-11 DIAGNOSIS — I10 Essential (primary) hypertension: Secondary | ICD-10-CM | POA: Diagnosis not present

## 2014-06-11 DIAGNOSIS — A047 Enterocolitis due to Clostridium difficile: Secondary | ICD-10-CM | POA: Diagnosis not present

## 2014-06-16 DIAGNOSIS — I10 Essential (primary) hypertension: Secondary | ICD-10-CM | POA: Diagnosis not present

## 2014-06-16 DIAGNOSIS — C50411 Malignant neoplasm of upper-outer quadrant of right female breast: Secondary | ICD-10-CM | POA: Diagnosis not present

## 2014-06-16 DIAGNOSIS — M6281 Muscle weakness (generalized): Secondary | ICD-10-CM | POA: Diagnosis not present

## 2014-06-16 DIAGNOSIS — D630 Anemia in neoplastic disease: Secondary | ICD-10-CM | POA: Diagnosis not present

## 2014-06-16 DIAGNOSIS — A047 Enterocolitis due to Clostridium difficile: Secondary | ICD-10-CM | POA: Diagnosis not present

## 2014-06-16 DIAGNOSIS — F329 Major depressive disorder, single episode, unspecified: Secondary | ICD-10-CM | POA: Diagnosis not present

## 2014-06-18 DIAGNOSIS — F329 Major depressive disorder, single episode, unspecified: Secondary | ICD-10-CM | POA: Diagnosis not present

## 2014-06-18 DIAGNOSIS — D630 Anemia in neoplastic disease: Secondary | ICD-10-CM | POA: Diagnosis not present

## 2014-06-18 DIAGNOSIS — M6281 Muscle weakness (generalized): Secondary | ICD-10-CM | POA: Diagnosis not present

## 2014-06-18 DIAGNOSIS — C50411 Malignant neoplasm of upper-outer quadrant of right female breast: Secondary | ICD-10-CM | POA: Diagnosis not present

## 2014-06-18 DIAGNOSIS — I10 Essential (primary) hypertension: Secondary | ICD-10-CM | POA: Diagnosis not present

## 2014-06-18 DIAGNOSIS — A047 Enterocolitis due to Clostridium difficile: Secondary | ICD-10-CM | POA: Diagnosis not present

## 2014-06-19 DIAGNOSIS — C50411 Malignant neoplasm of upper-outer quadrant of right female breast: Secondary | ICD-10-CM | POA: Diagnosis not present

## 2014-06-22 ENCOUNTER — Ambulatory Visit (HOSPITAL_BASED_OUTPATIENT_CLINIC_OR_DEPARTMENT_OTHER): Payer: Medicare Other | Admitting: Hematology and Oncology

## 2014-06-22 ENCOUNTER — Other Ambulatory Visit: Payer: Self-pay | Admitting: *Deleted

## 2014-06-22 ENCOUNTER — Ambulatory Visit (HOSPITAL_BASED_OUTPATIENT_CLINIC_OR_DEPARTMENT_OTHER): Payer: Medicare Other

## 2014-06-22 ENCOUNTER — Telehealth: Payer: Self-pay | Admitting: Hematology and Oncology

## 2014-06-22 ENCOUNTER — Telehealth: Payer: Self-pay | Admitting: *Deleted

## 2014-06-22 ENCOUNTER — Other Ambulatory Visit (HOSPITAL_BASED_OUTPATIENT_CLINIC_OR_DEPARTMENT_OTHER): Payer: Medicare Other

## 2014-06-22 VITALS — BP 134/65 | HR 92 | Temp 97.6°F | Resp 18 | Ht 63.0 in | Wt 184.1 lb

## 2014-06-22 DIAGNOSIS — Z5111 Encounter for antineoplastic chemotherapy: Secondary | ICD-10-CM

## 2014-06-22 DIAGNOSIS — C50411 Malignant neoplasm of upper-outer quadrant of right female breast: Secondary | ICD-10-CM

## 2014-06-22 DIAGNOSIS — E876 Hypokalemia: Secondary | ICD-10-CM | POA: Diagnosis not present

## 2014-06-22 DIAGNOSIS — D6481 Anemia due to antineoplastic chemotherapy: Secondary | ICD-10-CM

## 2014-06-22 DIAGNOSIS — D709 Neutropenia, unspecified: Secondary | ICD-10-CM

## 2014-06-22 LAB — COMPREHENSIVE METABOLIC PANEL (CC13)
ALT: 11 U/L (ref 0–55)
ANION GAP: 11 meq/L (ref 3–11)
AST: 19 U/L (ref 5–34)
Albumin: 2.7 g/dL — ABNORMAL LOW (ref 3.5–5.0)
Alkaline Phosphatase: 75 U/L (ref 40–150)
BUN: 11.2 mg/dL (ref 7.0–26.0)
CHLORIDE: 107 meq/L (ref 98–109)
CO2: 25 mEq/L (ref 22–29)
CREATININE: 0.9 mg/dL (ref 0.6–1.1)
Calcium: 9.2 mg/dL (ref 8.4–10.4)
EGFR: 65 mL/min/{1.73_m2} — ABNORMAL LOW (ref >90)
GLUCOSE: 113 mg/dL (ref 70–140)
Potassium: 3.4 mEq/L — ABNORMAL LOW (ref 3.5–5.1)
Sodium: 142 mEq/L (ref 136–145)
Total Bilirubin: 0.49 mg/dL (ref 0.20–1.20)
Total Protein: 6 g/dL — ABNORMAL LOW (ref 6.4–8.3)

## 2014-06-22 LAB — CBC WITH DIFFERENTIAL/PLATELET
BASO%: 1.4 % (ref 0.0–2.0)
Basophils Absolute: 0.1 10*3/uL (ref 0.0–0.1)
EOS%: 3.2 % (ref 0.0–7.0)
Eosinophils Absolute: 0.2 10*3/uL (ref 0.0–0.5)
HCT: 33.7 % — ABNORMAL LOW (ref 34.8–46.6)
HEMOGLOBIN: 10.7 g/dL — AB (ref 11.6–15.9)
LYMPH#: 0.8 10*3/uL — AB (ref 0.9–3.3)
LYMPH%: 13.2 % — ABNORMAL LOW (ref 14.0–49.7)
MCH: 26.8 pg (ref 25.1–34.0)
MCHC: 31.7 g/dL (ref 31.5–36.0)
MCV: 84.6 fL (ref 79.5–101.0)
MONO#: 0.6 10*3/uL (ref 0.1–0.9)
MONO%: 10 % (ref 0.0–14.0)
NEUT#: 4.4 10*3/uL (ref 1.5–6.5)
NEUT%: 72.2 % (ref 38.4–76.8)
Platelets: 206 10*3/uL (ref 145–400)
RBC: 3.99 10*6/uL (ref 3.70–5.45)
RDW: 20.2 % — AB (ref 11.2–14.5)
WBC: 6.1 10*3/uL (ref 3.9–10.3)

## 2014-06-22 MED ORDER — PACLITAXEL PROTEIN-BOUND CHEMO INJECTION 100 MG
80.0000 mg/m2 | Freq: Once | INTRAVENOUS | Status: AC
  Administered 2014-06-22: 150 mg via INTRAVENOUS
  Filled 2014-06-22: qty 30

## 2014-06-22 MED ORDER — ONDANSETRON 8 MG/50ML IVPB (CHCC)
8.0000 mg | Freq: Once | INTRAVENOUS | Status: AC
  Administered 2014-06-22: 8 mg via INTRAVENOUS

## 2014-06-22 MED ORDER — ONDANSETRON 8 MG/NS 50 ML IVPB
INTRAVENOUS | Status: AC
  Filled 2014-06-22: qty 8

## 2014-06-22 MED ORDER — HEPARIN SOD (PORK) LOCK FLUSH 100 UNIT/ML IV SOLN
500.0000 [IU] | Freq: Once | INTRAVENOUS | Status: AC | PRN
  Administered 2014-06-22: 500 [IU]
  Filled 2014-06-22: qty 5

## 2014-06-22 MED ORDER — SODIUM CHLORIDE 0.9 % IV SOLN
Freq: Once | INTRAVENOUS | Status: AC
  Administered 2014-06-22: 10:00:00 via INTRAVENOUS

## 2014-06-22 MED ORDER — SODIUM CHLORIDE 0.9 % IJ SOLN
10.0000 mL | INTRAMUSCULAR | Status: DC | PRN
  Administered 2014-06-22: 10 mL
  Filled 2014-06-22: qty 10

## 2014-06-22 NOTE — Telephone Encounter (Signed)
, °

## 2014-06-22 NOTE — Telephone Encounter (Signed)
Per staff message and POF I have adjusted and scheduled appts. Advised scheduler of appts. JMW  

## 2014-06-22 NOTE — Patient Instructions (Addendum)
[facility] Discharge Instructions for Patients Receiving Chemotherapy  Today you received the following chemotherapy agents: Abraxane.  To help prevent nausea and vomiting after your treatment, we encourage you to take your nausea medication as prescribed.   If you develop nausea and vomiting that is not controlled by your nausea medication, call the clinic.   BELOW ARE SYMPTOMS THAT SHOULD BE REPORTED IMMEDIATELY:  *FEVER GREATER THAN 100.5 F  *CHILLS WITH OR WITHOUT FEVER  NAUSEA AND VOMITING THAT IS NOT CONTROLLED WITH YOUR NAUSEA MEDICATION  *UNUSUAL SHORTNESS OF BREATH  *UNUSUAL BRUISING OR BLEEDING  TENDERNESS IN MOUTH AND THROAT WITH OR WITHOUT PRESENCE OF ULCERS  *URINARY PROBLEMS  *BOWEL PROBLEMS  UNUSUAL RASH Items with * indicate a potential emergency and should be followed up as soon as possible.  Feel free to call the clinic you have any questions or concerns. The clinic phone number is [phone].   Nanoparticle Albumin-Bound Paclitaxel injection What is this medicine? NANOPARTICLE ALBUMIN-BOUND PACLITAXEL (Na no PAHR ti kuhl al BYOO muhn-bound PAK li TAX el) is a chemotherapy drug. It targets fast dividing cells, like cancer cells, and causes these cells to die. This medicine is used to treat advanced breast cancer and advanced lung cancer. This medicine may be used for other purposes; ask your health care provider or pharmacist if you have questions. COMMON BRAND NAME(S): Abraxane What should I tell my health care provider before I take this medicine? They need to know if you have any of these conditions: -kidney disease -liver disease -low blood counts, like low platelets, red blood cells, or white blood cells -recent or ongoing radiation therapy -an unusual or allergic reaction to paclitaxel, albumin, other chemotherapy, other medicines, foods, dyes, or preservatives -pregnant or trying to get pregnant -breast-feeding How should I  use this medicine? This drug is given as an infusion into a vein. It is administered in a hospital or clinic by a specially trained health care professional. Talk to your pediatrician regarding the use of this medicine in children. Special care may be needed. Overdosage: If you think you have taken too much of this medicine contact a poison control center or emergency room at once. NOTE: This medicine is only for you. Do not share this medicine with others. What if I miss a dose? It is important not to miss your dose. Call your doctor or health care professional if you are unable to keep an appointment. What may interact with this medicine? -cyclosporine -diazepam -ketoconazole -medicines to increase blood counts like filgrastim, pegfilgrastim, sargramostim -other chemotherapy drugs like cisplatin, doxorubicin, epirubicin, etoposide, teniposide, vincristine -quinidine -testosterone -vaccines -verapamil Talk to your doctor or health care professional before taking any of these medicines: -acetaminophen -aspirin -ibuprofen -ketoprofen -naproxen This list may not describe all possible interactions. Give your health care provider a list of all the medicines, herbs, non-prescription drugs, or dietary supplements you use. Also tell them if you smoke, drink alcohol, or use illegal drugs. Some items may interact with your medicine. What should I watch for while using this medicine? Your condition will be monitored carefully while you are receiving this medicine. You will need important blood work done while you are taking this medicine. This drug may make you feel generally unwell. This is not uncommon, as chemotherapy can affect healthy cells as well as cancer cells. Report any side effects. Continue your course of treatment even though you feel ill unless your doctor tells you to stop. In some cases, you  may be given additional medicines to help with side effects. Follow all directions for their  use. Call your doctor or health care professional for advice if you get a fever, chills or sore throat, or other symptoms of a cold or flu. Do not treat yourself. This drug decreases your body's ability to fight infections. Try to avoid being around people who are sick. This medicine may increase your risk to bruise or bleed. Call your doctor or health care professional if you notice any unusual bleeding. Be careful brushing and flossing your teeth or using a toothpick because you may get an infection or bleed more easily. If you have any dental work done, tell your dentist you are receiving this medicine. Avoid taking products that contain aspirin, acetaminophen, ibuprofen, naproxen, or ketoprofen unless instructed by your doctor. These medicines may hide a fever. Do not become pregnant while taking this medicine. Women should inform their doctor if they wish to become pregnant or think they might be pregnant. There is a potential for serious side effects to an unborn child. Talk to your health care professional or pharmacist for more information. Do not breast-feed an infant while taking this medicine. Men are advised not to father a child while receiving this medicine. What side effects may I notice from receiving this medicine? Side effects that you should report to your doctor or health care professional as soon as possible: -allergic reactions like skin rash, itching or hives, swelling of the face, lips, or tongue -low blood counts - This drug may decrease the number of white blood cells, red blood cells and platelets. You may be at increased risk for infections and bleeding. -signs of infection - fever or chills, cough, sore throat, pain or difficulty passing urine -signs of decreased platelets or bleeding - bruising, pinpoint red spots on the skin, black, tarry stools, nosebleeds -signs of decreased red blood cells - unusually weak or tired, fainting spells, lightheadedness -breathing  problems -changes in vision -chest pain -high or low blood pressure -mouth sores -nausea and vomiting -pain, swelling, redness or irritation at the injection site -pain, tingling, numbness in the hands or feet -slow or irregular heartbeat -swelling of the ankle, feet, hands Side effects that usually do not require medical attention (report to your doctor or health care professional if they continue or are bothersome): -aches, pains -changes in the color of fingernails -diarrhea -hair loss -loss of appetite This list may not describe all possible side effects. Call your doctor for medical advice about side effects. You may report side effects to FDA at 1-800-FDA-1088. Where should I keep my medicine? This drug is given in a hospital or clinic and will not be stored at home. NOTE: This sheet is a summary. It may not cover all possible information. If you have questions about this medicine, talk to your doctor, pharmacist, or health care provider.  2015, [employer]. (2012-06-24 16:48:50)

## 2014-06-22 NOTE — Progress Notes (Signed)
Patient Care Team: Hulan Fess, MD as PCP - General (Family Medicine)  DIAGNOSIS: Breast cancer of upper-outer quadrant of right female breast   Staging form: Breast, AJCC 7th Edition     Clinical: Stage IA (T1c, N0, cM0) - Signed by Thea Silversmith, MD on 02/18/2014     Pathologic: Stage IIA (T2, N0, cM0) - Signed by Rulon Eisenmenger, MD on 03/12/2014   SUMMARY OF ONCOLOGIC HISTORY:   Breast cancer of upper-outer quadrant of right female breast   02/27/2014 Surgery Right breast lumpectomy: Invasive ductal carcinoma grade 3, 2.5 cm, DCIS intermediate grade, margins negative, 2 SLN negative, ER/PR HER-2 negative   03/30/2014 -  Chemotherapy Adjuvant chemotherapy with dose dense Adriamycin Cytoxan x4 followed by Abraxane weekly x12   05/17/2014 - 05/26/2014 Hospital Admission Neutropenic fever secondary to dental infection which led to C. difficile enterocolitis, atypical pneumonia and SIRS    CHIEF COMPLIANT: Cycle 1 weekly Abraxane  INTERVAL HISTORY: Cheyenne Riggs is a 69 year old lady with above-mentioned history of right breast cancer triple negative disease who is currently on adjuvant chemotherapy. After 4 cycles of Adriamycin Cytoxan she was hospitalized with neutropenic fever and C. difficile enterocolitis and atypical pneumonia. I had given her a two-week treatment break and she is here today to reinitiate chemotherapy. She is due to receive first dose of Abraxane today. She reports that the 2 week break has helped her a lot gaining her energy taste appetite back. She is no longer having diarrhea and is not taking Flagyl anymore. She is also not taking potassium for the past 4 days. She has persistent left leg numbness from prior.  REVIEW OF SYSTEMS:   Constitutional: Denies fevers, chills or abnormal weight loss Eyes: Denies blurriness of vision Ears, nose, mouth, throat, and face: Denies mucositis or sore throat Respiratory: Denies cough, dyspnea or wheezes Cardiovascular: Denies  palpitation, chest discomfort or lower extremity swelling Gastrointestinal:  Denies nausea, heartburn or change in bowel habits Skin: Denies abnormal skin rashes Lymphatics: Denies new lymphadenopathy or easy bruising Neurological: Chronic numbness in the left foot Behavioral/Psych: Mood is stable, no new changes  Breast:  denies any pain or lumps or nodules in either breasts All other systems were reviewed with the patient and are negative.  I have reviewed the past medical history, past surgical history, social history and family history with the patient and they are unchanged from previous note.  ALLERGIES:  is allergic to ciprofloxacin; demerol; lidocaine; other; septra; codeine; and novocain.  MEDICATIONS:  Current Outpatient Prescriptions  Medication Sig Dispense Refill  . cholecalciferol (VITAMIN D) 1000 UNITS tablet Take 1,000 Units by mouth daily.     Marland Kitchen dexamethasone (DECADRON) 4 MG tablet   1  . diphenhydrAMINE (BENADRYL) 25 MG tablet Take 25 mg by mouth 2 (two) times daily.    Marland Kitchen FLUoxetine (PROZAC) 10 MG capsule Take 1 capsule (10 mg total) by mouth daily. 30 capsule 1  . Probiotic Product (PROBIOTIC DAILY PO) Take 1 tablet by mouth daily.     Marland Kitchen acetaminophen (TYLENOL) 325 MG tablet Take 325 mg by mouth every 6 (six) hours as needed for moderate pain.     . Calcium Carbonate-Simethicone (MAALOX ADVANCED MAX ST) 1000-60 MG CHEW Chew 1 tablet by mouth every 6 (six) hours as needed. Heart burn    . loperamide (IMODIUM) 2 MG capsule Take 2 mg by mouth daily as needed for diarrhea or loose stools.    Marland Kitchen LORazepam (ATIVAN) 0.5 MG tablet Take 1 tablet (  0.5 mg total) by mouth every 8 (eight) hours as needed. Nausea (Patient not taking: Reported on 06/08/2014) 30 tablet 0  . potassium chloride SA (K-DUR,KLOR-CON) 20 MEQ tablet Take 2 tablets (40 mEq total) by mouth 2 (two) times daily. (Patient not taking: Reported on 06/22/2014) 2 tablet 0  . prochlorperazine (COMPAZINE) 10 MG tablet   1  .  ranitidine (ZANTAC) 150 MG tablet Take 150 mg by mouth 2 (two) times daily as needed for heartburn.     . simethicone (MYLICON) 80 MG chewable tablet Chew 80 mg by mouth every 6 (six) hours as needed for flatulence.     No current facility-administered medications for this visit.    PHYSICAL EXAMINATION: ECOG PERFORMANCE STATUS: 1 - Symptomatic but completely ambulatory  Filed Vitals:   06/22/14 0920  BP: 134/65  Pulse: 92  Temp: 97.6 F (36.4 C)  Resp: 18   Filed Weights   06/22/14 0920  Weight: 184 lb 1.6 oz (83.507 kg)    GENERAL:alert, no distress and comfortable SKIN: skin color, texture, turgor are normal, no rashes or significant lesions EYES: normal, Conjunctiva are pink and non-injected, sclera clear OROPHARYNX:no exudate, no erythema and lips, buccal mucosa, and tongue normal  NECK: supple, thyroid normal size, non-tender, without nodularity LYMPH:  no palpable lymphadenopathy in the cervical, axillary or inguinal LUNGS: clear to auscultation and percussion with normal breathing effort HEART: regular rate & rhythm and no murmurs and no lower extremity edema ABDOMEN:abdomen soft, non-tender and normal bowel sounds Musculoskeletal:no cyanosis of digits and no clubbing  NEURO: alert & oriented x 3 with fluent speech, no focal motor/sensory deficits  LABORATORY DATA:  I have reviewed the data as listed   Chemistry      Component Value Date/Time   NA 137 06/08/2014 0905   NA 138 05/26/2014 0445   K 4.8 06/08/2014 0905   K 3.2* 05/26/2014 0445   CL 104 05/26/2014 0445   CO2 21* 06/08/2014 0905   CO2 26 05/26/2014 0445   BUN 7.9 06/08/2014 0905   BUN 8 05/26/2014 0445   CREATININE 1.1 06/08/2014 0905   CREATININE 0.86 05/26/2014 0445      Component Value Date/Time   CALCIUM 9.1 06/08/2014 0905   CALCIUM 8.6 05/26/2014 0445   ALKPHOS 73 06/08/2014 0905   ALKPHOS 75 05/17/2014 2054   AST 20 06/08/2014 0905   AST 18 05/17/2014 2054   ALT 9 06/08/2014 0905    ALT 20 05/17/2014 2054   BILITOT 0.50 06/08/2014 0905   BILITOT 1.0 05/17/2014 2054       Lab Results  Component Value Date   WBC 6.1 06/22/2014   HGB 10.7* 06/22/2014   HCT 33.7* 06/22/2014   MCV 84.6 06/22/2014   PLT 206 06/22/2014   NEUTROABS 4.4 06/22/2014   ASSESSMENT & PLAN:  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 Patient started systemic adjuvant chemotherapy with dose dense Adriamycin Cytoxan. She started chemotherapy in 03/30/2014. Dose of chemotherapy was reduced after cycle 1 for severe neutropenia. Today is cycle 1 of Abraxane delayed for recent hospitalization with dental infection, neutropenic fever and C. difficile colitis.   Chemotoxicities: 1. Neutropenia grade 4: Required dose reduction for cycle 2 of chemotherapy and for cycle 4 AC, hospitalization for neutropenic fever after fourth cycle of AC, dose reduction after cycle 1 of Abraxane.  2. Alopecia 3. Chemotherapy-induced nausea : Resolved 4. Chemotherapy-induced diarrhea: Patient went on to get C. difficile enterocolitis and was treated with Flagyl. 5. Chemotherapy-induced severe fatigue: Much improved with treatment break 6. Chemotherapy-induced anemia: Hemoglobin improved to 10.7 7. Hospitalization for neutropenic fever, dental infection with C. difficile colitis: After 4 cycles of Adriamycin and Cytoxan. She is currently off Flagyl and diarrhea has resolved. Currently takes probiotics 8. Hypokalemia: Attributable to prior diarrhea. She was on 80 mEq of potassium might decrease it to 40 mEq. She has been off potassium for the last 4 days. We are awaiting today's potassium result. Since the patient does not have any further diarrhea, I expect the potassium to remain stable   Tobacco cessation: Patient quit smoking since chemotherapy and does not  even have a desire to smoke.  Return to clinic in 2 weeks for follow-up and weekly for chemotherapy    Orders Placed This Encounter  Procedures  . CBC with Differential    Standing Status: Future     Number of Occurrences:      Standing Expiration Date: 06/22/2015  . Comprehensive metabolic panel (Cmet) - [facility]    Standing Status: Future     Number of Occurrences:      Standing Expiration Date: 06/22/2015   The patient has a good understanding of the overall plan. she agrees with it. She will call with any problems that may develop before her next visit here.   Rulon Eisenmenger, MD

## 2014-06-22 NOTE — Assessment & Plan Note (Addendum)
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 Patient started systemic adjuvant chemotherapy with dose dense Adriamycin Cytoxan. She started chemotherapy in 03/30/2014. Dose of chemotherapy was reduced after cycle 1 for severe neutropenia. Today is cycle 1 of Abraxane delayed for recent hospitalization with dental infection, neutropenic fever and C. difficile colitis.   Chemotoxicities: 1. Neutropenia grade 4: Required dose reduction for cycle 2 of chemotherapy and for cycle 4 AC, hospitalization for neutropenic fever after fourth cycle of AC, dose reduction after cycle 1 of Abraxane. 2. Alopecia 3. Chemotherapy-induced nausea : Resolved 4. Chemotherapy-induced diarrhea: Patient went on to get C. difficile enterocolitis and was treated with Flagyl. 5. Chemotherapy-induced severe fatigue: Much improved with treatment break 6. Chemotherapy-induced anemia: Hemoglobin improved to 10.7 7. Hospitalization for neutropenic fever, dental infection with C. difficile colitis: After 4 cycles of Adriamycin and Cytoxan. She is currently off Flagyl and diarrhea has resolved. Currently takes probiotics 8. Hypokalemia: Attributable to prior diarrhea. She was on 80 mEq of potassium might decrease it to 40 mEq. She has been off potassium for the last 4 days. We are awaiting today's potassium result. Since the patient does not have any further diarrhea, I expect the potassium to remain stable   Tobacco cessation: Patient quit smoking since chemotherapy and does not even have a desire to smoke.  Return to clinic in 2 weeks for follow-up and weekly for chemotherapy

## 2014-06-23 DIAGNOSIS — I10 Essential (primary) hypertension: Secondary | ICD-10-CM | POA: Diagnosis not present

## 2014-06-23 DIAGNOSIS — A047 Enterocolitis due to Clostridium difficile: Secondary | ICD-10-CM | POA: Diagnosis not present

## 2014-06-23 DIAGNOSIS — C50411 Malignant neoplasm of upper-outer quadrant of right female breast: Secondary | ICD-10-CM | POA: Diagnosis not present

## 2014-06-23 DIAGNOSIS — M6281 Muscle weakness (generalized): Secondary | ICD-10-CM | POA: Diagnosis not present

## 2014-06-23 DIAGNOSIS — F329 Major depressive disorder, single episode, unspecified: Secondary | ICD-10-CM | POA: Diagnosis not present

## 2014-06-23 DIAGNOSIS — D630 Anemia in neoplastic disease: Secondary | ICD-10-CM | POA: Diagnosis not present

## 2014-06-24 ENCOUNTER — Telehealth: Payer: Self-pay | Admitting: *Deleted

## 2014-06-24 NOTE — Telephone Encounter (Signed)
-----   Message from Braulio Bosch, RN sent at 06/22/2014 10:44 AM EST ----- Regarding: Chemo Follow up Call First time Abraxane. Dr. Lindi Adie.

## 2014-06-24 NOTE — Telephone Encounter (Signed)
Called and spoke to pt, has nausea medication at home, no questions or concerns at this time.

## 2014-06-25 DIAGNOSIS — A047 Enterocolitis due to Clostridium difficile: Secondary | ICD-10-CM | POA: Diagnosis not present

## 2014-06-25 DIAGNOSIS — F329 Major depressive disorder, single episode, unspecified: Secondary | ICD-10-CM | POA: Diagnosis not present

## 2014-06-25 DIAGNOSIS — M6281 Muscle weakness (generalized): Secondary | ICD-10-CM | POA: Diagnosis not present

## 2014-06-25 DIAGNOSIS — I10 Essential (primary) hypertension: Secondary | ICD-10-CM | POA: Diagnosis not present

## 2014-06-25 DIAGNOSIS — C50411 Malignant neoplasm of upper-outer quadrant of right female breast: Secondary | ICD-10-CM | POA: Diagnosis not present

## 2014-06-25 DIAGNOSIS — D630 Anemia in neoplastic disease: Secondary | ICD-10-CM | POA: Diagnosis not present

## 2014-06-29 ENCOUNTER — Emergency Department (HOSPITAL_COMMUNITY): Payer: Medicare Other

## 2014-06-29 ENCOUNTER — Encounter (HOSPITAL_COMMUNITY): Payer: Self-pay | Admitting: Emergency Medicine

## 2014-06-29 ENCOUNTER — Inpatient Hospital Stay (HOSPITAL_COMMUNITY)
Admission: EM | Admit: 2014-06-29 | Discharge: 2014-07-01 | DRG: 293 | Disposition: A | Discharge status: Home / Self Care | Payer: Medicare Other | Attending: Internal Medicine | Admitting: Internal Medicine

## 2014-06-29 ENCOUNTER — Other Ambulatory Visit (HOSPITAL_BASED_OUTPATIENT_CLINIC_OR_DEPARTMENT_OTHER): Payer: Medicare Other

## 2014-06-29 ENCOUNTER — Other Ambulatory Visit: Payer: Self-pay

## 2014-06-29 ENCOUNTER — Ambulatory Visit (HOSPITAL_BASED_OUTPATIENT_CLINIC_OR_DEPARTMENT_OTHER): Payer: Medicare Other

## 2014-06-29 DIAGNOSIS — D6481 Anemia due to antineoplastic chemotherapy: Secondary | ICD-10-CM | POA: Diagnosis present

## 2014-06-29 DIAGNOSIS — K8020 Calculus of gallbladder without cholecystitis without obstruction: Secondary | ICD-10-CM | POA: Diagnosis present

## 2014-06-29 DIAGNOSIS — E780 Pure hypercholesterolemia: Secondary | ICD-10-CM | POA: Diagnosis present

## 2014-06-29 DIAGNOSIS — I959 Hypotension, unspecified: Secondary | ICD-10-CM | POA: Diagnosis present

## 2014-06-29 DIAGNOSIS — I5031 Acute diastolic (congestive) heart failure: Secondary | ICD-10-CM | POA: Diagnosis not present

## 2014-06-29 DIAGNOSIS — E278 Other specified disorders of adrenal gland: Secondary | ICD-10-CM | POA: Diagnosis present

## 2014-06-29 DIAGNOSIS — C50411 Malignant neoplasm of upper-outer quadrant of right female breast: Secondary | ICD-10-CM

## 2014-06-29 DIAGNOSIS — C50419 Malignant neoplasm of upper-outer quadrant of unspecified female breast: Secondary | ICD-10-CM | POA: Diagnosis present

## 2014-06-29 DIAGNOSIS — I340 Nonrheumatic mitral (valve) insufficiency: Secondary | ICD-10-CM | POA: Diagnosis present

## 2014-06-29 DIAGNOSIS — F329 Major depressive disorder, single episode, unspecified: Secondary | ICD-10-CM | POA: Diagnosis present

## 2014-06-29 DIAGNOSIS — R7989 Other specified abnormal findings of blood chemistry: Secondary | ICD-10-CM

## 2014-06-29 DIAGNOSIS — J90 Pleural effusion, not elsewhere classified: Secondary | ICD-10-CM

## 2014-06-29 DIAGNOSIS — J449 Chronic obstructive pulmonary disease, unspecified: Secondary | ICD-10-CM | POA: Diagnosis present

## 2014-06-29 DIAGNOSIS — I10 Essential (primary) hypertension: Secondary | ICD-10-CM | POA: Diagnosis present

## 2014-06-29 DIAGNOSIS — T451X5A Adverse effect of antineoplastic and immunosuppressive drugs, initial encounter: Secondary | ICD-10-CM | POA: Diagnosis present

## 2014-06-29 DIAGNOSIS — I351 Nonrheumatic aortic (valve) insufficiency: Secondary | ICD-10-CM | POA: Diagnosis present

## 2014-06-29 DIAGNOSIS — I509 Heart failure, unspecified: Secondary | ICD-10-CM

## 2014-06-29 DIAGNOSIS — Z87442 Personal history of urinary calculi: Secondary | ICD-10-CM | POA: Diagnosis not present

## 2014-06-29 DIAGNOSIS — E876 Hypokalemia: Secondary | ICD-10-CM | POA: Diagnosis present

## 2014-06-29 DIAGNOSIS — R0602 Shortness of breath: Secondary | ICD-10-CM

## 2014-06-29 DIAGNOSIS — Z885 Allergy status to narcotic agent status: Secondary | ICD-10-CM

## 2014-06-29 DIAGNOSIS — Z87891 Personal history of nicotine dependence: Secondary | ICD-10-CM

## 2014-06-29 DIAGNOSIS — R778 Other specified abnormalities of plasma proteins: Secondary | ICD-10-CM

## 2014-06-29 DIAGNOSIS — Z90710 Acquired absence of both cervix and uterus: Secondary | ICD-10-CM

## 2014-06-29 DIAGNOSIS — I5033 Acute on chronic diastolic (congestive) heart failure: Secondary | ICD-10-CM | POA: Diagnosis not present

## 2014-06-29 DIAGNOSIS — F419 Anxiety disorder, unspecified: Secondary | ICD-10-CM | POA: Diagnosis present

## 2014-06-29 DIAGNOSIS — Z7982 Long term (current) use of aspirin: Secondary | ICD-10-CM | POA: Diagnosis not present

## 2014-06-29 DIAGNOSIS — Z5111 Encounter for antineoplastic chemotherapy: Secondary | ICD-10-CM

## 2014-06-29 DIAGNOSIS — J984 Other disorders of lung: Secondary | ICD-10-CM | POA: Diagnosis not present

## 2014-06-29 LAB — CBC WITH DIFFERENTIAL/PLATELET
BASO%: 0.5 % (ref 0.0–2.0)
BASOS ABS: 0 10*3/uL (ref 0.0–0.1)
EOS%: 5.4 % (ref 0.0–7.0)
Eosinophils Absolute: 0.2 10*3/uL (ref 0.0–0.5)
HEMATOCRIT: 32.2 % — AB (ref 34.8–46.6)
HEMOGLOBIN: 10.3 g/dL — AB (ref 11.6–15.9)
LYMPH#: 0.7 10*3/uL — AB (ref 0.9–3.3)
LYMPH%: 17.8 % (ref 14.0–49.7)
MCH: 27.6 pg (ref 25.1–34.0)
MCHC: 32 g/dL (ref 31.5–36.0)
MCV: 86.3 fL (ref 79.5–101.0)
MONO#: 0.3 10*3/uL (ref 0.1–0.9)
MONO%: 6.5 % (ref 0.0–14.0)
NEUT%: 69.8 % (ref 38.4–76.8)
NEUTROS ABS: 2.7 10*3/uL (ref 1.5–6.5)
PLATELETS: 191 10*3/uL (ref 145–400)
RBC: 3.73 10*6/uL (ref 3.70–5.45)
RDW: 18.4 % — ABNORMAL HIGH (ref 11.2–14.5)
WBC: 3.9 10*3/uL (ref 3.9–10.3)

## 2014-06-29 LAB — COMPREHENSIVE METABOLIC PANEL (CC13)
ALT: 13 U/L (ref 0–55)
ANION GAP: 12 meq/L — AB (ref 3–11)
AST: 21 U/L (ref 5–34)
Albumin: 3 g/dL — ABNORMAL LOW (ref 3.5–5.0)
Alkaline Phosphatase: 80 U/L (ref 40–150)
BILIRUBIN TOTAL: 0.43 mg/dL (ref 0.20–1.20)
BUN: 8.7 mg/dL (ref 7.0–26.0)
CALCIUM: 9.1 mg/dL (ref 8.4–10.4)
CO2: 24 meq/L (ref 22–29)
CREATININE: 1 mg/dL (ref 0.6–1.1)
Chloride: 106 mEq/L (ref 98–109)
EGFR: 60 mL/min/{1.73_m2} — ABNORMAL LOW (ref >90)
GLUCOSE: 127 mg/dL (ref 70–140)
Potassium: 3.6 mEq/L (ref 3.5–5.1)
Sodium: 143 mEq/L (ref 136–145)
TOTAL PROTEIN: 6.1 g/dL — AB (ref 6.4–8.3)

## 2014-06-29 LAB — COMPREHENSIVE METABOLIC PANEL
ALBUMIN: 3 g/dL — AB (ref 3.5–5.2)
ALK PHOS: 66 U/L (ref 39–117)
ALT: 13 U/L (ref 0–35)
AST: 31 U/L (ref 0–37)
Anion gap: 6 (ref 5–15)
BUN: 9 mg/dL (ref 6–23)
CHLORIDE: 108 mmol/L (ref 96–112)
CO2: 25 mmol/L (ref 19–32)
CREATININE: 1.05 mg/dL (ref 0.50–1.10)
Calcium: 8.3 mg/dL — ABNORMAL LOW (ref 8.4–10.5)
GFR calc Af Amer: 62 mL/min — ABNORMAL LOW (ref >90)
GFR calc non Af Amer: 53 mL/min — ABNORMAL LOW (ref >90)
Glucose, Bld: 183 mg/dL — ABNORMAL HIGH (ref 70–99)
POTASSIUM: 2.8 mmol/L — AB (ref 3.5–5.1)
SODIUM: 139 mmol/L (ref 135–145)
TOTAL PROTEIN: 6 g/dL (ref 6.0–8.3)
Total Bilirubin: 0.6 mg/dL (ref 0.3–1.2)

## 2014-06-29 LAB — CBC
HEMATOCRIT: 30.4 % — AB (ref 36.0–46.0)
Hemoglobin: 9.5 g/dL — ABNORMAL LOW (ref 12.0–15.0)
MCH: 27.4 pg (ref 26.0–34.0)
MCHC: 31.3 g/dL (ref 30.0–36.0)
MCV: 87.6 fL (ref 78.0–100.0)
PLATELETS: 196 10*3/uL (ref 150–400)
RBC: 3.47 MIL/uL — ABNORMAL LOW (ref 3.87–5.11)
RDW: 18.4 % — AB (ref 11.5–15.5)
WBC: 4.1 10*3/uL (ref 4.0–10.5)

## 2014-06-29 LAB — TECHNOLOGIST REVIEW

## 2014-06-29 LAB — I-STAT TROPONIN, ED: Troponin i, poc: 0.26 ng/mL (ref 0.00–0.08)

## 2014-06-29 LAB — BRAIN NATRIURETIC PEPTIDE: B Natriuretic Peptide: 534.8 pg/mL — ABNORMAL HIGH (ref 0.0–100.0)

## 2014-06-29 MED ORDER — HEPARIN SOD (PORK) LOCK FLUSH 100 UNIT/ML IV SOLN
500.0000 [IU] | Freq: Once | INTRAVENOUS | Status: AC | PRN
  Administered 2014-06-29: 500 [IU]
  Filled 2014-06-29: qty 5

## 2014-06-29 MED ORDER — SODIUM CHLORIDE 0.9 % IJ SOLN
10.0000 mL | INTRAMUSCULAR | Status: DC | PRN
  Administered 2014-06-29: 10 mL
  Filled 2014-06-29: qty 10

## 2014-06-29 MED ORDER — ONDANSETRON HCL 4 MG PO TABS: 8.0000 mg | ORAL_TABLET | Freq: Every day | ORAL | Status: DC | PRN

## 2014-06-29 MED ORDER — FLUOXETINE HCL 10 MG PO CAPS
10.0000 mg | ORAL_CAPSULE | Freq: Every day | ORAL | Status: DC
  Administered 2014-06-30 – 2014-07-01 (×2): 10 mg via ORAL
  Filled 2014-06-29 (×2): qty 1

## 2014-06-29 MED ORDER — LORAZEPAM 0.5 MG PO TABS
0.5000 mg | ORAL_TABLET | Freq: Three times a day (TID) | ORAL | Status: DC | PRN
  Administered 2014-06-30: 0.5 mg via ORAL
  Filled 2014-06-29: qty 1

## 2014-06-29 MED ORDER — DIPHENHYDRAMINE HCL 25 MG PO CAPS
25.0000 mg | ORAL_CAPSULE | Freq: Every day | ORAL | Status: DC | PRN
Start: 2014-06-29 — End: 2014-07-01

## 2014-06-29 MED ORDER — SODIUM CHLORIDE 0.9 % IJ SOLN
3.0000 mL | Freq: Two times a day (BID) | INTRAMUSCULAR | Status: DC
  Administered 2014-06-30: 3 mL via INTRAVENOUS

## 2014-06-29 MED ORDER — ACETAMINOPHEN 325 MG PO TABS: 650.0000 mg | ORAL_TABLET | Freq: Four times a day (QID) | ORAL | Status: DC | PRN

## 2014-06-29 MED ORDER — SIMETHICONE 80 MG PO CHEW
80.0000 mg | CHEWABLE_TABLET | Freq: Four times a day (QID) | ORAL | Status: DC | PRN
  Filled 2014-06-29: qty 1

## 2014-06-29 MED ORDER — SODIUM CHLORIDE 0.9 % IV SOLN
Freq: Once | INTRAVENOUS | Status: AC
  Administered 2014-06-29: 11:00:00 via INTRAVENOUS

## 2014-06-29 MED ORDER — IOHEXOL 350 MG/ML SOLN
100.0000 mL | Freq: Once | INTRAVENOUS | Status: AC | PRN
  Administered 2014-06-29: 100 mL via INTRAVENOUS

## 2014-06-29 MED ORDER — LISINOPRIL 2.5 MG PO TABS
2.5000 mg | ORAL_TABLET | Freq: Every day | ORAL | Status: DC
  Administered 2014-06-30 (×2): 2.5 mg via ORAL
  Filled 2014-06-29 (×3): qty 1

## 2014-06-29 MED ORDER — POTASSIUM CHLORIDE 10 MEQ/100ML IV SOLN
10.0000 meq | INTRAVENOUS | Status: AC
  Administered 2014-06-30 (×3): 10 meq via INTRAVENOUS
  Filled 2014-06-29 (×3): qty 100

## 2014-06-29 MED ORDER — IPRATROPIUM-ALBUTEROL 0.5-2.5 (3) MG/3ML IN SOLN
3.0000 mL | Freq: Once | RESPIRATORY_TRACT | Status: AC
  Administered 2014-06-29: 3 mL via RESPIRATORY_TRACT
  Filled 2014-06-29: qty 3

## 2014-06-29 MED ORDER — FUROSEMIDE 10 MG/ML IJ SOLN
40.0000 mg | Freq: Two times a day (BID) | INTRAMUSCULAR | Status: DC
  Administered 2014-06-30 (×2): 40 mg via INTRAVENOUS
  Filled 2014-06-29 (×2): qty 4

## 2014-06-29 MED ORDER — HEPARIN SODIUM (PORCINE) 5000 UNIT/ML IJ SOLN
5000.0000 [IU] | Freq: Three times a day (TID) | INTRAMUSCULAR | Status: DC
  Administered 2014-06-30 – 2014-07-01 (×5): 5000 [IU] via SUBCUTANEOUS
  Filled 2014-06-29 (×6): qty 1

## 2014-06-29 MED ORDER — ONDANSETRON 8 MG/50ML IVPB (CHCC)
8.0000 mg | Freq: Once | INTRAVENOUS | Status: AC
  Administered 2014-06-29: 8 mg via INTRAVENOUS

## 2014-06-29 MED ORDER — LIDOCAINE-PRILOCAINE 2.5-2.5 % EX CREA
1.0000 "application " | TOPICAL_CREAM | CUTANEOUS | Status: DC
  Filled 2014-06-29: qty 5

## 2014-06-29 MED ORDER — POTASSIUM CHLORIDE 10 MEQ/100ML IV SOLN
10.0000 meq | Freq: Once | INTRAVENOUS | Status: AC
  Administered 2014-06-29: 10 meq via INTRAVENOUS
  Filled 2014-06-29: qty 100

## 2014-06-29 MED ORDER — ONDANSETRON 8 MG/NS 50 ML IVPB
INTRAVENOUS | Status: AC
  Filled 2014-06-29: qty 8

## 2014-06-29 MED ORDER — FUROSEMIDE 10 MG/ML IJ SOLN
20.0000 mg | Freq: Once | INTRAMUSCULAR | Status: AC
  Administered 2014-06-29: 20 mg via INTRAVENOUS
  Filled 2014-06-29: qty 4

## 2014-06-29 MED ORDER — ONDANSETRON HCL 4 MG/2ML IJ SOLN: 4.0000 mg | Freq: Four times a day (QID) | INTRAMUSCULAR | Status: DC | PRN

## 2014-06-29 MED ORDER — FUROSEMIDE 10 MG/ML IJ SOLN
20.0000 mg | Freq: Once | INTRAMUSCULAR | Status: AC
  Administered 2014-06-30: 20 mg via INTRAVENOUS
  Filled 2014-06-29: qty 2

## 2014-06-29 MED ORDER — ONDANSETRON HCL 4 MG PO TABS: 4.0000 mg | ORAL_TABLET | Freq: Four times a day (QID) | ORAL | Status: DC | PRN

## 2014-06-29 MED ORDER — ACETAMINOPHEN 650 MG RE SUPP: 650.0000 mg | Freq: Four times a day (QID) | RECTAL | Status: DC | PRN

## 2014-06-29 MED ORDER — CALCIUM CARBONATE-SIMETHICONE 1000-60 MG PO CHEW: 1.0000 | CHEWABLE_TABLET | Freq: Four times a day (QID) | ORAL | Status: DC | PRN

## 2014-06-29 MED ORDER — VITAMIN D3 25 MCG (1000 UNIT) PO TABS
1000.0000 [IU] | ORAL_TABLET | Freq: Every day | ORAL | Status: DC
  Administered 2014-06-30 – 2014-07-01 (×2): 1000 [IU] via ORAL
  Filled 2014-06-29 (×2): qty 1

## 2014-06-29 MED ORDER — ASPIRIN EC 325 MG PO TBEC
325.0000 mg | DELAYED_RELEASE_TABLET | Freq: Every day | ORAL | Status: DC
  Administered 2014-07-01: 325 mg via ORAL
  Filled 2014-06-29 (×2): qty 1

## 2014-06-29 MED ORDER — CALCIUM CARBONATE ANTACID 500 MG PO CHEW: 1.0000 | CHEWABLE_TABLET | Freq: Four times a day (QID) | ORAL | Status: DC | PRN

## 2014-06-29 MED ORDER — PACLITAXEL PROTEIN-BOUND CHEMO INJECTION 100 MG
80.0000 mg/m2 | Freq: Once | INTRAVENOUS | Status: AC
  Administered 2014-06-29: 150 mg via INTRAVENOUS
  Filled 2014-06-29: qty 30

## 2014-06-29 NOTE — ED Notes (Signed)
Brought in by EMS from home with c/o shortness of breath.  No hx asthma/COPD. Hx Breast CA, had chemo today.  Started having shortness of breath last night that got worse today.  Pt was recently treated with pneumonitis.  Pt was given Albuterol 5 mg per neb tx, Solu-Medrol 125 mg IV and O2 at 2 L/min en route to ED.  Pt presents to ED with still some shortness of breath, wheezing noted to lung fields.

## 2014-06-29 NOTE — ED Notes (Signed)
Troponin result reported to Dr Aline Brochure...KLJ

## 2014-06-29 NOTE — H&P (Addendum)
[facility] History and Physical  Cheyenne Riggs:[id number] DOB: 06/06/1945 DOA: 06/29/2014  Referring physician: ER physician. PCP: Gennette Pac, MD   Chief Complaint: Shortness of breath.  HPI: Cheyenne Riggs is a 69 y.o. female history of breast cancer on chemotherapy last chemotherapy received was today presents to the ER because of shortness of breath. Patient states that over the last 2 days she has been having episodic shortness of breath mostly on exertion. Patient states yesterday she had an episode of exertional shortness of breath which was self-limited. Patient had chemotherapy today and had gone for shopping and following which patient came home and do breast in a recliner. Later in the evening patient woke up and went to the bathroom when patient felt short of breath again. Patient also felt tachycardic yesterday. Denies any chest pain. In the ER patient had CT angiogram of the chest which was negative for PE but did show bilateral pleural effusion and is concerning for CHF and COPD but no primary embolism. CT and exam also shows cardiomegaly with left ventricle hypertrophy. Patient also has consolidation which is favoring atelectasis. Patient has no fever chills or productive cough. BNP was elevated. Patient otherwise denies any nausea vomiting abdominal pain and diarrhea. Patient has had recent episodes of C. difficile colitis which patient states has resolved at this time.  Review of Systems: As presented in the history of presenting illness, rest negative.  Past Medical History  Diagnosis Date  . Anxiety   . Back pain   . H/O bladder problems   . Cholelithiasis   . Depression   . High blood pressure   . Hypercholesteremia   . Kidney stones   . Gum disease   . Complication of anesthesia     bp goes up  . Wears glasses   . Wears dentures     top   Past Surgical History  Procedure Laterality Date  . Colon biopsy    . Mouth surgery    . Spine surgery     . Colonoscopy    . Tubal ligation    . Lithotripsy    . Rhinoplasty    . Back surgery  1989    lumb lam  . Breast biopsy Right 02/05/2014    invasive ductal cncer  . Abdominal hysterectomy  1982  . Cholecystectomy  2011    lap choli  . Radioactive seed guided mastectomy with axillary sentinel lymph node biopsy Right 02/27/2014    Procedure: RADIOACTIVE SEED GUIDED RIGHT PARTIAL MASTECTOMY WITH AXILLARY SENTINEL LYMPH NODE BIOPSY;  Surgeon: Stark Klein, MD;  Location: [facility];  Service: General;  Laterality: Right;  . Portacath placement N/A 02/27/2014    Procedure: INSERTION PORT-A-CATH;  Surgeon: Stark Klein, MD;  Location: [name];  Service: General;  Laterality: N/A;   Social History:  reports that she quit smoking about 6 weeks ago. She does not have any smokeless tobacco history on file. She reports that she does not drink alcohol or use illicit drugs. Where does patient live home. Can patient participate in ADLs? Yes.  Allergies  Allergen Reactions  . Ciprofloxacin Nausea And Vomiting  . Demerol [Meperidine] Hives  . Lidocaine     palpatations  . Other Swelling    Eye ointments  . Septra [Sulfamethoxazole-Trimethoprim] Nausea And Vomiting  . Codeine Rash  . Novocain [Procaine] Palpitations    Family History: History reviewed. No pertinent family history.    Prior to Admission medications  Medication Sig Start Date End Date Taking? Authorizing Provider  acetaminophen (TYLENOL) 325 MG tablet Take 325 mg by mouth every 6 (six) hours as needed for moderate pain (pain).    Yes Historical Provider, MD  Calcium Carbonate-Simethicone (MAALOX ADVANCED MAX ST) 1000-60 MG CHEW Chew 1 tablet by mouth every 6 (six) hours as needed (indigestion). Heart burn   Yes Historical Provider, MD  cholecalciferol (VITAMIN D) 1000 UNITS tablet Take 1,000 Units by mouth daily.    Yes Historical Provider, MD  diphenhydrAMINE (BENADRYL) 25 MG tablet Take 25  mg by mouth daily as needed for allergies (allergies).    Yes Historical Provider, MD  FLUoxetine (PROZAC) 10 MG capsule Take 1 capsule (10 mg total) by mouth daily. 05/26/14  Yes Hosie Poisson, MD  lidocaine-prilocaine (EMLA) cream Apply 1 application topically once a week. On Chemo Day (Monday). 03/27/14  Yes Historical Provider, MD  LORazepam (ATIVAN) 0.5 MG tablet Take 1 tablet (0.5 mg total) by mouth every 8 (eight) hours as needed. Nausea 05/26/14  Yes Hosie Poisson, MD  ondansetron (ZOFRAN) 8 MG tablet Take 8 mg by mouth daily as needed for nausea (nausea).  03/30/14  Yes Historical Provider, MD  Probiotic Product (PROBIOTIC DAILY PO) Take 1 tablet by mouth daily.    Yes Historical Provider, MD  loperamide (IMODIUM) 2 MG capsule Take 2 mg by mouth daily as needed for diarrhea or loose stools.    Historical Provider, MD  potassium chloride SA (K-DUR,KLOR-CON) 20 MEQ tablet Take 2 tablets (40 mEq total) by mouth 2 (two) times daily. Patient not taking: Reported on 06/22/2014 05/26/14   Hosie Poisson, MD  ranitidine (ZANTAC) 150 MG tablet Take 150 mg by mouth 2 (two) times daily as needed for heartburn.     Historical Provider, MD    Physical Exam: Filed Vitals:   06/29/14 2100 06/29/14 2104 06/29/14 2150 06/29/14 2220  BP: 107/69 107/69 114/64 137/76  Pulse: 111 108 108 113  Temp:    97.7 F (36.5 C)  TempSrc:    Oral  Resp: 19 18 20 28   Height:    5\' 3"  (1.6 m)  Weight:    76.34 kg (168 lb 4.8 oz)  SpO2: 98% 97% 97% 93%     General:  Well built and moderately nourished.  Eyes: Anicteric no pallor.  ENT: No discharge from the ears eyes nose and mouth.  Neck: No JVD or mass felt.  Cardiovascular: S1-S2 heard.  Respiratory: No rhonchi or crepitations.  Abdomen: Soft nontender bowel sounds present.  Skin: No rash.  Musculoskeletal: No edema.  Psychiatric: Appears normal.  Neurologic: Alert awake oriented to time place and person. Moves all extremities.  Labs on Admission:   Basic Metabolic Panel:  Recent Labs Lab 06/29/14 1028 06/29/14 1740  NA 143 139  K 3.6 2.8*  CL  --  108  CO2 24 25  GLUCOSE 127 183*  BUN 8.7 9  CREATININE 1.0 1.05  CALCIUM 9.1 8.3*   Liver Function Tests:  Recent Labs Lab 06/29/14 1028 06/29/14 1740  AST 21 31  ALT 13 13  ALKPHOS 80 66  BILITOT 0.43 0.6  PROT 6.1* 6.0  ALBUMIN 3.0* 3.0*   No results for input(s): LIPASE, AMYLASE in the last 168 hours. No results for input(s): AMMONIA in the last 168 hours. CBC:  Recent Labs Lab 06/29/14 1028 06/29/14 1740  WBC 3.9 4.1  NEUTROABS 2.7  --   HGB 10.3* 9.5*  HCT 32.2* 30.4*  MCV 86.3  87.6  PLT 191 196   Cardiac Enzymes: No results for input(s): CKTOTAL, CKMB, CKMBINDEX, TROPONINI in the last 168 hours.  BNP (last 3 results)  Recent Labs  06/29/14 1740  BNP 534.8*    ProBNP (last 3 results) No results for input(s): PROBNP in the last 8760 hours.  CBG: No results for input(s): GLUCAP in the last 168 hours.  Radiological Exams on Admission: Dg Chest 2 View  06/29/2014   CLINICAL DATA:  Short of breath  EXAM: CHEST  2 VIEW  COMPARISON:  05/24/2014  FINDINGS: Heart size upper normal. Mild vascular congestion. Slight increase in right pleural effusion which is small. Small left effusion unchanged. Increase in right lower lobe airspace disease which may be atelectasis or infiltrate or possibly edema. Port-A-Cath tip in the SVC.  IMPRESSION: Slight increase in right lower lobe airspace disease and right effusion. This may represent fluid overload or less likely pneumonia. There is underlying pulmonary vascular congestion.   Electronically Signed   By: Franchot Gallo M.D.   On: 06/29/2014 18:45   Ct Angio Chest Pe W/cm &/or Wo Cm  06/29/2014   CLINICAL DATA:  69 year old with current history of breast cancer for which she underwent chemotherapy earlier today. She presents with progressively worsening shortness of breath which began last night. Wheezing on  auscultation.  EXAM: CT ANGIOGRAPHY CHEST WITH CONTRAST  TECHNIQUE: Multidetector CT imaging of the chest was performed using the standard protocol during bolus administration of intravenous contrast. Multiplanar CT image reconstructions and MIPs were obtained to evaluate the vascular anatomy.  CONTRAST:  160mL OMNIPAQUE IOHEXOL 350 MG/ML IV.  COMPARISON:  No prior CT.  Multiple prior chest x-rays.  FINDINGS: Contrast opacification of the pulmonary arteries is good. No filling defects within either main pulmonary artery or their branches in either lung to suggest pulmonary embolism. Heart enlarged with left ventricular predominance and left ventricular hypertrophy. At least moderate if not severe 3 vessel coronary atherosclerosis. No pericardial effusion. Moderate atherosclerosis involving the thoracic and upper abdominal aorta without aneurysm.  Emphysematous changes throughout both lungs, particularly the upper lobes. Large right and moderate-sized bland left pleural effusions. Consolidation with air bronchograms in the lower lobes, right greater than left. Diffuse interstitial opacities throughout both lungs and patchy airspace opacities in the upper lobes and superior segment left lower lobe. Central airways patent with mild central bronchial wall thickening.  Numerous normal-sized lymph nodes throughout the mediastinum and in both hila. No pathologic lymphadenopathy in the chest, including either axilla. Thyroid gland normal in appearance.  Mild diffuse enlargement of both adrenal glands without nodularity. Visualized upper abdomen otherwise unremarkable for the early arterial phase of enhancement. Bone window images demonstrate degenerative disc disease and spondylosis throughout the lower cervical and thoracic spine with chronic disc protrusions at T6-7, T8- 9 and T11-12 calcification in posterior annular fibers, but no evidence of osseous metastatic disease.  Review of the MIP images confirms the above  findings.  IMPRESSION: 1. No evidence of pulmonary embolism. 2. Cardiomegaly with left ventricular predominance and left ventricular hypertrophy. 3. Mild CHF superimposed upon COPD/emphysema. 4. Bilateral pleural effusions, large on the right and moderate-sized on the left, with associated consolidation in the lower lobes. Passive atelectasis is favored over pneumonia. 5. Bilateral adrenal hyperplasia.   Electronically Signed   By: Evangeline Dakin M.D.   On: 06/29/2014 19:55    EKG: Independently reviewed. Sinus tachycardia. Prolonged QT.  Assessment/Plan Principal Problem:   CHF (congestive heart failure) Active Problems:  Breast cancer of upper-outer quadrant of right female breast   Essential hypertension, benign   Antineoplastic chemotherapy induced anemia   1. Decompensated CHF - patient last EF measured in October 2015 was 60-65% with grade 1 diastolic dysfunction. At this time I suspect patient's CHF was most likely chemotherapy-induced patient was on Adriamycin. I did discuss with cardiologist on call Dr. Elias Else who at this time agrees with continuing with Lasix and check 2-D echo. Dr. Elias Else also advised to add lisinopril. I have placed patient on Lasix 20 mg IV every 12 and has received so far 40 g IV. I have added lisinopril 2.5 mg by mouth. Closely follow intake and output metabolic panel daily weights. Reconsult cardiology in a.m. 2. Elevated troponin from CHF - patient denies any chest pain at this time we will cycle cardiac markers. Patient is on aspirin. Check 2-D echo. 3. Hypokalemia - patient is receiving potassium IV replacement. Check magnesium levels. Patient does have prolonged QT and since patient is also receiving Lasix aggressively replace IV potassium. 4. Anemia secondary to chemotherapy - follow CBC closely. 5. Breast cancer on chemotherapy - per oncologist. 6. Recently treated for C. difficile colitis and also neutropenic fever. 7. Consolidation in the CT  angiogram chest favors atelectasis and patient presently is afebrile and has no symptoms to suggest pneumonia.  I did discuss with him on-call pulmonary critical care Dr. Lake Bells, patient's bilateral pleural effusion more on the right side. Dr. Lake Bells is advised to continue with dialysis and repeat chest x-ray in a.m. and if patient's pleural effusion does not improve with diuresis Then to consult pulmonary for possible thoracentesis.   DVT Prophylaxis Heparin. Code Status: Full code.  Family Communication: None.  Disposition Plan: Admit to inpatient.    KAKRAKANDY,ARSHAD N. [facility] Pager [phone].  If 7PM-7AM, please contact night-coverage [email or web address] Password [facility] 06/29/2014, 11:04 PM

## 2014-06-29 NOTE — Patient Instructions (Signed)
[facility] Discharge Instructions for Patients Receiving Chemotherapy  Today you received the following chemotherapy agents Abraxane  To help prevent nausea and vomiting after your treatment, we encourage you to take your nausea medication as prescribed.   If you develop nausea and vomiting that is not controlled by your nausea medication, call the clinic.   BELOW ARE SYMPTOMS THAT SHOULD BE REPORTED IMMEDIATELY:  *FEVER GREATER THAN 100.5 F  *CHILLS WITH OR WITHOUT FEVER  NAUSEA AND VOMITING THAT IS NOT CONTROLLED WITH YOUR NAUSEA MEDICATION  *UNUSUAL SHORTNESS OF BREATH  *UNUSUAL BRUISING OR BLEEDING  TENDERNESS IN MOUTH AND THROAT WITH OR WITHOUT PRESENCE OF ULCERS  *URINARY PROBLEMS  *BOWEL PROBLEMS  UNUSUAL RASH Items with * indicate a potential emergency and should be followed up as soon as possible.  Feel free to call the clinic you have any questions or concerns. The clinic phone number is [phone].    

## 2014-06-29 NOTE — ED Notes (Signed)
Bed: WA22 Expected date:  Expected time:  Means of arrival:  Comments: EMS 

## 2014-06-29 NOTE — ED Provider Notes (Signed)
CSN: [id number]     Arrival date & time 06/29/14  1657 History   First MD Initiated Contact with Patient 06/29/14 1754     Chief Complaint  Patient presents with  . Shortness of Breath     (Consider location/radiation/quality/duration/timing/severity/associated sxs/prior Treatment) HPI Cheyenne Riggs is a 69 year old female with past medical history of recurrent breast cancer on chemotherapy, anxiety who presents the ER complaining of an episode of shortness of breath. Patient reports an episode yesterday of sudden onset of shortness of breath which resolved spontaneously after taking her prescribed Ativan. Patient reports today several hours ago after waking up, she sat up on the sofa, and had a sudden onset of shortness of breath again. Patient states tonight her shortness of breath persisted and she called 911. EMS brought patient to the ER, and noted bilateral wheezing. EMS gave patient albuterol neb treatment, Solu-Medrol which patient noted improvement after. Patient reports mild persistent shortness of breath in the ER, denies chest pain, lightheadedness, palpitations, nausea, vomiting, abdominal pain, fever.  Past Medical History  Diagnosis Date  . Anxiety   . Back pain   . H/O bladder problems   . Cholelithiasis   . Depression   . High blood pressure   . Hypercholesteremia   . Kidney stones   . Gum disease   . Complication of anesthesia     bp goes up  . Wears glasses   . Wears dentures     top   Past Surgical History  Procedure Laterality Date  . Colon biopsy    . Mouth surgery    . Spine surgery    . Colonoscopy    . Tubal ligation    . Lithotripsy    . Rhinoplasty    . Back surgery  1989    lumb lam  . Breast biopsy Right 02/05/2014    invasive ductal cncer  . Abdominal hysterectomy  1982  . Cholecystectomy  2011    lap choli  . Radioactive seed guided mastectomy with axillary sentinel lymph node biopsy Right 02/27/2014    Procedure: RADIOACTIVE SEED GUIDED RIGHT  PARTIAL MASTECTOMY WITH AXILLARY SENTINEL LYMPH NODE BIOPSY;  Surgeon: Stark Klein, MD;  Location: [facility];  Service: General;  Laterality: Right;  . Portacath placement N/A 02/27/2014    Procedure: INSERTION PORT-A-CATH;  Surgeon: Stark Klein, MD;  Location: [facility];  Service: General;  Laterality: N/A;   History reviewed. No pertinent family history. History  Substance Use Topics  . Smoking status: Former Smoker -- 0.00 packs/day    Quit date: 05/16/2014  . Smokeless tobacco: Not on file  . Alcohol Use: No   OB History    No data available     Review of Systems  Constitutional: Negative for fever.  HENT: Negative for trouble swallowing.   Eyes: Negative for visual disturbance.  Respiratory: Positive for shortness of breath.   Cardiovascular: Negative for chest pain.  Gastrointestinal: Negative for nausea, vomiting and abdominal pain.  Genitourinary: Negative for dysuria.  Musculoskeletal: Negative for neck pain.  Skin: Negative for rash.  Neurological: Negative for dizziness, weakness and numbness.  Psychiatric/Behavioral: Negative.       Allergies  Ciprofloxacin; Demerol; Lidocaine; Other; Septra; Codeine; and Novocain  Home Medications   Prior to Admission medications   Medication Sig Start Date End Date Taking? Authorizing Provider  acetaminophen (TYLENOL) 325 MG tablet Take 325 mg by mouth every 6 (six) hours as needed for moderate pain (pain).  Yes Historical Provider, MD  Calcium Carbonate-Simethicone (MAALOX ADVANCED MAX ST) 1000-60 MG CHEW Chew 1 tablet by mouth every 6 (six) hours as needed (indigestion). Heart burn   Yes Historical Provider, MD  cholecalciferol (VITAMIN D) 1000 UNITS tablet Take 1,000 Units by mouth daily.    Yes Historical Provider, MD  diphenhydrAMINE (BENADRYL) 25 MG tablet Take 25 mg by mouth daily as needed for allergies (allergies).    Yes Historical Provider, MD  FLUoxetine (PROZAC) 10 MG capsule  Take 1 capsule (10 mg total) by mouth daily. 05/26/14  Yes Hosie Poisson, MD  lidocaine-prilocaine (EMLA) cream Apply 1 application topically once a week. On Chemo Day (Monday). 03/27/14  Yes Historical Provider, MD  LORazepam (ATIVAN) 0.5 MG tablet Take 1 tablet (0.5 mg total) by mouth every 8 (eight) hours as needed. Nausea 05/26/14  Yes Hosie Poisson, MD  ondansetron (ZOFRAN) 8 MG tablet Take 8 mg by mouth daily as needed for nausea (nausea).  03/30/14  Yes Historical Provider, MD  Probiotic Product (PROBIOTIC DAILY PO) Take 1 tablet by mouth daily.    Yes Historical Provider, MD  loperamide (IMODIUM) 2 MG capsule Take 2 mg by mouth daily as needed for diarrhea or loose stools.    Historical Provider, MD  potassium chloride SA (K-DUR,KLOR-CON) 20 MEQ tablet Take 2 tablets (40 mEq total) by mouth 2 (two) times daily. Patient not taking: Reported on 06/22/2014 05/26/14   Hosie Poisson, MD  ranitidine (ZANTAC) 150 MG tablet Take 150 mg by mouth 2 (two) times daily as needed for heartburn.     Historical Provider, MD   BP 137/76 mmHg  Pulse 113  Temp(Src) 97.7 F (36.5 C) (Oral)  Resp 28  Ht 5\' 3"  (1.6 m)  Wt 168 lb 4.8 oz (76.34 kg)  BMI 29.82 kg/m2  SpO2 93% Physical Exam  Constitutional: She is oriented to person, place, and time. She appears well-developed and well-nourished.  Frail appearing female, in mild amount of respiratory distress.  HENT:  Head: Normocephalic and atraumatic.  Mouth/Throat: Oropharynx is clear and moist. No oropharyngeal exudate.  Eyes: Right eye exhibits no discharge. Left eye exhibits no discharge. No scleral icterus.  Neck: Normal range of motion.  Cardiovascular: Regular rhythm, S1 normal, S2 normal and normal heart sounds.  Tachycardia present.   No murmur heard. Tachycardic at 110 on exam  Pulmonary/Chest: Effort normal. No accessory muscle usage. No tachypnea. No respiratory distress. She has rales in the right middle field, the right lower field, the left  middle field and the left lower field.  Patient with mild labored breathing, mild respiratory distress, continues to speak in full, clear sentences.  Abdominal: Soft. There is no tenderness.  Musculoskeletal: Normal range of motion. She exhibits no edema or tenderness.  Neurological: She is alert and oriented to person, place, and time. She has normal strength. No cranial nerve deficit or sensory deficit. Coordination normal. GCS eye subscore is 4. GCS verbal subscore is 5. GCS motor subscore is 6.  Patient fully alert, answering questions appropriately in full, clear sentences. Cranial nerves II through XII grossly intact. Motor strength 5 out of 5 in all major muscle groups of upper and lower extremities. Distal sensation intact.  Skin: Skin is warm and dry. No rash noted. She is not diaphoretic.  Psychiatric: She has a normal mood and affect.    ED Course  Procedures (including critical care time) Labs Review Labs Reviewed  CBC - Abnormal; Notable for the following:    RBC  3.47 (*)    Hemoglobin 9.5 (*)    HCT 30.4 (*)    RDW 18.4 (*)    All other components within normal limits  COMPREHENSIVE METABOLIC PANEL - Abnormal; Notable for the following:    Potassium 2.8 (*)    Glucose, Bld 183 (*)    Calcium 8.3 (*)    Albumin 3.0 (*)    GFR calc non Af Amer 53 (*)    GFR calc Af Amer 62 (*)    All other components within normal limits  BRAIN NATRIURETIC PEPTIDE - Abnormal; Notable for the following:    B Natriuretic Peptide 534.8 (*)    All other components within normal limits  TROPONIN I - Abnormal; Notable for the following:    Troponin I 0.33 (*)    All other components within normal limits  I-STAT TROPOININ, ED - Abnormal; Notable for the following:    Troponin i, poc 0.26 (*)    All other components within normal limits  MAGNESIUM  CK  TROPONIN I  TROPONIN I  TSH  BASIC METABOLIC PANEL  CBC    Imaging Review Dg Chest 2 View  06/29/2014   CLINICAL DATA:  Short of  breath  EXAM: CHEST  2 VIEW  COMPARISON:  05/24/2014  FINDINGS: Heart size upper normal. Mild vascular congestion. Slight increase in right pleural effusion which is small. Small left effusion unchanged. Increase in right lower lobe airspace disease which may be atelectasis or infiltrate or possibly edema. Port-A-Cath tip in the SVC.  IMPRESSION: Slight increase in right lower lobe airspace disease and right effusion. This may represent fluid overload or less likely pneumonia. There is underlying pulmonary vascular congestion.   Electronically Signed   By: Franchot Gallo M.D.   On: 06/29/2014 18:45   Ct Angio Chest Pe W/cm &/or Wo Cm  06/29/2014   CLINICAL DATA:  69 year old with current history of breast cancer for which she underwent chemotherapy earlier today. She presents with progressively worsening shortness of breath which began last night. Wheezing on auscultation.  EXAM: CT ANGIOGRAPHY CHEST WITH CONTRAST  TECHNIQUE: Multidetector CT imaging of the chest was performed using the standard protocol during bolus administration of intravenous contrast. Multiplanar CT image reconstructions and MIPs were obtained to evaluate the vascular anatomy.  CONTRAST:  130mL OMNIPAQUE IOHEXOL 350 MG/ML IV.  COMPARISON:  No prior CT.  Multiple prior chest x-rays.  FINDINGS: Contrast opacification of the pulmonary arteries is good. No filling defects within either main pulmonary artery or their branches in either lung to suggest pulmonary embolism. Heart enlarged with left ventricular predominance and left ventricular hypertrophy. At least moderate if not severe 3 vessel coronary atherosclerosis. No pericardial effusion. Moderate atherosclerosis involving the thoracic and upper abdominal aorta without aneurysm.  Emphysematous changes throughout both lungs, particularly the upper lobes. Large right and moderate-sized bland left pleural effusions. Consolidation with air bronchograms in the lower lobes, right greater than  left. Diffuse interstitial opacities throughout both lungs and patchy airspace opacities in the upper lobes and superior segment left lower lobe. Central airways patent with mild central bronchial wall thickening.  Numerous normal-sized lymph nodes throughout the mediastinum and in both hila. No pathologic lymphadenopathy in the chest, including either axilla. Thyroid gland normal in appearance.  Mild diffuse enlargement of both adrenal glands without nodularity. Visualized upper abdomen otherwise unremarkable for the early arterial phase of enhancement. Bone window images demonstrate degenerative disc disease and spondylosis throughout the lower cervical and thoracic spine with chronic  disc protrusions at T6-7, T8- 9 and T11-12 calcification in posterior annular fibers, but no evidence of osseous metastatic disease.  Review of the MIP images confirms the above findings.  IMPRESSION: 1. No evidence of pulmonary embolism. 2. Cardiomegaly with left ventricular predominance and left ventricular hypertrophy. 3. Mild CHF superimposed upon COPD/emphysema. 4. Bilateral pleural effusions, large on the right and moderate-sized on the left, with associated consolidation in the lower lobes. Passive atelectasis is favored over pneumonia. 5. Bilateral adrenal hyperplasia.   Electronically Signed   By: Evangeline Dakin M.D.   On: 06/29/2014 19:55     EKG Interpretation   Date/Time:  Monday June 29 2014 17:07:56 EST Ventricular Rate:  109 PR Interval:  127 QRS Duration: 74 QT Interval:  441 QTC Calculation: 594 R Axis:   69 Text Interpretation:  Sinus tachycardia Nonspecific T abnormalities,  diffuse leads Prolonged QT interval Otherwise no significant change  Confirmed by HARRISON  MD, FORREST ([id number]) on 06/29/2014 6:00:47 PM      MDM   Final diagnoses:  SOB (shortness of breath)    Patient with sudden onset of shortness of breath, currently on chemotherapy for breast cancer. Patient's shortness of  breath improved with albuterol treatment in the ER, her patient remains tachycardic throughout her stay. Troponin elevated at 0.25. EKG unremarkable for acute pathology. Patient denying any chest pain. Low concern for ACS at this time based on patient's symptoms, exam and x-ray which are suggestive of patient being fluid overloaded. With patient's history of cancer, persistent tachycardia and sudden onset of shortness of breath, will follow with CT angiogram to rule out PE. Patient noted to be hypokalemic, replaced with 10 mEq IV in the ER.  CT angiogram of chest remarkable for impression: 1. No evidence of pulmonary embolism. 2. Cardiomegaly with left ventricular predominance and left ventricular hypertrophy. 3. Mild CHF superimposed upon COPD/emphysema. 4. Bilateral pleural effusions, large on the right and moderate-sized on the left, with associated consolidation in the lower lobes. Passive atelectasis is favored over pneumonia. 5. Bilateral adrenal hyperplasia.  BNP noted to be elevated to 500 range. Will admit patient for possible worsening of congestive heart failure.The patient appears reasonably stabilized for admission considering the current resources, flow, and capabilities available in the ED at this time, and I doubt any other [facility] requiring further screening and/or treatment in the ED prior to admission.  BP 137/76 mmHg  Pulse 113  Temp(Src) 97.7 F (36.5 C) (Oral)  Resp 28  Ht 5\' 3"  (1.6 m)  Wt 168 lb 4.8 oz (76.34 kg)  BMI 29.82 kg/m2  SpO2 93%  Signed,  Dahlia Bailiff, PA-C 1:46 AM  Patient seen and discussed with Dr. Pamella Pert, M.D.   Carrie Mew, PA-C 06/30/14 1121  Pamella Pert, MD 06/30/14 [phone]

## 2014-06-30 ENCOUNTER — Inpatient Hospital Stay (HOSPITAL_COMMUNITY): Payer: Medicare Other

## 2014-06-30 DIAGNOSIS — I509 Heart failure, unspecified: Secondary | ICD-10-CM

## 2014-06-30 DIAGNOSIS — I10 Essential (primary) hypertension: Secondary | ICD-10-CM

## 2014-06-30 LAB — TROPONIN I
TROPONIN I: 0.24 ng/mL — AB (ref <0.031)
TROPONIN I: 0.29 ng/mL — AB (ref <0.031)
Troponin I: 0.33 ng/mL — ABNORMAL HIGH (ref <0.031)

## 2014-06-30 LAB — BASIC METABOLIC PANEL
Anion gap: 5 (ref 5–15)
BUN: 11 mg/dL (ref 6–23)
CALCIUM: 8.1 mg/dL — AB (ref 8.4–10.5)
CHLORIDE: 107 mmol/L (ref 96–112)
CO2: 24 mmol/L (ref 19–32)
CREATININE: 0.98 mg/dL (ref 0.50–1.10)
GFR calc Af Amer: 67 mL/min — ABNORMAL LOW (ref >90)
GFR calc non Af Amer: 58 mL/min — ABNORMAL LOW (ref >90)
Glucose, Bld: 196 mg/dL — ABNORMAL HIGH (ref 70–99)
Potassium: 3.5 mmol/L (ref 3.5–5.1)
Sodium: 136 mmol/L (ref 135–145)

## 2014-06-30 LAB — CBC
HCT: 27.1 % — ABNORMAL LOW (ref 36.0–46.0)
HEMOGLOBIN: 8.7 g/dL — AB (ref 12.0–15.0)
MCH: 27.9 pg (ref 26.0–34.0)
MCHC: 32.1 g/dL (ref 30.0–36.0)
MCV: 86.9 fL (ref 78.0–100.0)
Platelets: 189 10*3/uL (ref 150–400)
RBC: 3.12 MIL/uL — ABNORMAL LOW (ref 3.87–5.11)
RDW: 18.8 % — ABNORMAL HIGH (ref 11.5–15.5)
WBC: 4.2 10*3/uL (ref 4.0–10.5)

## 2014-06-30 LAB — TSH: TSH: 0.918 u[IU]/mL (ref 0.350–4.500)

## 2014-06-30 LAB — CK: Total CK: 34 U/L (ref 7–177)

## 2014-06-30 LAB — MAGNESIUM: Magnesium: 1.9 mg/dL (ref 1.5–2.5)

## 2014-06-30 MED ORDER — ALTEPLASE 2 MG IJ SOLR
2.0000 mg | Freq: Once | INTRAMUSCULAR | Status: AC
  Administered 2014-06-30: 2 mg
  Filled 2014-06-30 (×2): qty 2

## 2014-06-30 MED ORDER — SODIUM CHLORIDE 0.9 % IJ SOLN
10.0000 mL | INTRAMUSCULAR | Status: DC | PRN
  Administered 2014-06-30 – 2014-07-01 (×3): 10 mL
  Filled 2014-06-30 (×3): qty 40

## 2014-06-30 NOTE — Progress Notes (Signed)
O2 Sats at rest on 2L [place] - 97% Off O2 post ambulation 94%

## 2014-06-30 NOTE — Consult Note (Signed)
CONSULT NOTE  Date: 06/30/2014               Patient Name:  Cheyenne Riggs MRN: [id number]  DOB: 1945/08/21 Age / Sex: 69 y.o., female        PCP: Gennette Pac Primary Cardiologist: New/ Minola Guin            Referring Physician: Wynelle Cleveland              Reason for Consult: ? New CHF, dyspnea , on chemotherapy           History of Present Illness:  Cheyenne Riggs is a [name] with hx of HTN,  history of breast cancer on chemotherapy last chemotherapy received was today presents to the ER because of shortness of breath.  She had an episode fo dyspnea the day prior to admission.  Resolved on its own. No fever, cough.  Has had DOE and fatigue for the past several days.   Has had HTN for years and was treated with Diovan / HCTZ.  Her BP dropped when she started chemo and the Diovan / HCTZ was held on Oct. , 2015.    Developed severe DOE while walking around yesterday.   Had recurrent DOE walking to the bathroom last night.  Presented to the ER.  CT angio showed no pulmonary embolus.  She has bilateral pleural effusion.   She was treated with IV lasix - has diuresed 1.3 liters overnight Feeling much better.   Medications: Outpatient medications: Prescriptions prior to admission  Medication Sig Dispense Refill Last Dose  . acetaminophen (TYLENOL) 325 MG tablet Take 325 mg by mouth every 6 (six) hours as needed for moderate pain (pain).    Past Week at Unknown time  . Calcium Carbonate-Simethicone (MAALOX ADVANCED MAX ST) 1000-60 MG CHEW Chew 1 tablet by mouth every 6 (six) hours as needed (indigestion). Heart burn   Past Month at Unknown time  . cholecalciferol (VITAMIN D) 1000 UNITS tablet Take 1,000 Units by mouth daily.    06/29/2014 at Unknown time  . diphenhydrAMINE (BENADRYL) 25 MG tablet Take 25 mg by mouth daily as needed for allergies (allergies).    06/29/2014 at Unknown time  . FLUoxetine (PROZAC) 10 MG capsule Take 1 capsule (10 mg total) by mouth daily. 30  capsule 1 06/29/2014 at Unknown time  . lidocaine-prilocaine (EMLA) cream Apply 1 application topically once a week. On Chemo Day (Monday).  0 06/29/2014 at Unknown time  . LORazepam (ATIVAN) 0.5 MG tablet Take 1 tablet (0.5 mg total) by mouth every 8 (eight) hours as needed. Nausea 30 tablet 0 06/29/2014 at Unknown time  . ondansetron (ZOFRAN) 8 MG tablet Take 8 mg by mouth daily as needed for nausea (nausea).   1 06/28/2014 at Unknown time  . Probiotic Product (PROBIOTIC DAILY PO) Take 1 tablet by mouth daily.    06/29/2014 at Unknown time  . loperamide (IMODIUM) 2 MG capsule Take 2 mg by mouth daily as needed for diarrhea or loose stools.   unknown at unknown time  . potassium chloride SA (K-DUR,KLOR-CON) 20 MEQ tablet Take 2 tablets (40 mEq total) by mouth 2 (two) times daily. (Patient not taking: Reported on 06/22/2014) 2 tablet 0 Completed Course at Unknown time  . ranitidine (ZANTAC) 150 MG tablet Take 150 mg by mouth 2 (two) times daily as needed for heartburn.    Not Taking at Unknown time    Current medications: Current Facility-Administered Medications  Medication Dose Route Frequency Provider Last Rate Last Dose  . acetaminophen (TYLENOL) tablet 650 mg  650 mg Oral Q6H PRN Rise Patience, MD       Or  . acetaminophen (TYLENOL) suppository 650 mg  650 mg Rectal Q6H PRN Rise Patience, MD      . aspirin EC tablet 325 mg  325 mg Oral Daily Rise Patience, MD      . calcium carbonate (TUMS - dosed in mg elemental calcium) chewable tablet 200 mg of elemental calcium  1 tablet Oral Q6H PRN Rise Patience, MD       And  . simethicone (MYLICON) chewable tablet 80 mg  80 mg Oral Q6H PRN Rise Patience, MD      . cholecalciferol (VITAMIN D) tablet 1,000 Units  1,000 Units Oral Daily Rise Patience, MD      . diphenhydrAMINE (BENADRYL) capsule 25 mg  25 mg Oral Daily PRN Rise Patience, MD      . FLUoxetine (PROZAC) capsule 10 mg  10 mg Oral Daily Rise Patience, MD      . furosemide (LASIX) injection 40 mg  40 mg Intravenous Q12H Rise Patience, MD   40 mg at 06/30/14 0830  . heparin injection 5,000 Units  5,000 Units Subcutaneous 3 times per day Rise Patience, MD   5,000 Units at 06/30/14 [id number]  . [START ON 07/06/2014] lidocaine-prilocaine (EMLA) cream 1 application  1 application Topical Weekly Rise Patience, MD      . lisinopril (PRINIVIL,ZESTRIL) tablet 2.5 mg  2.5 mg Oral Daily Rise Patience, MD   2.5 mg at 06/30/14 0056  . LORazepam (ATIVAN) tablet 0.5 mg  0.5 mg Oral Q8H PRN Rise Patience, MD      . ondansetron [facility]) tablet 4 mg  4 mg Oral Q6H PRN Rise Patience, MD       Or  . ondansetron [facility]) injection 4 mg  4 mg Intravenous Q6H PRN Rise Patience, MD      . ondansetron [facility]) tablet 8 mg  8 mg Oral Daily PRN Rise Patience, MD      . sodium chloride 0.9 % injection 10-40 mL  10-40 mL Intracatheter PRN Rise Patience, MD   10 mL at 06/30/14 0533  . sodium chloride 0.9 % injection 3 mL  3 mL Intravenous Q12H Rise Patience, MD   3 mL at 06/30/14 0059  . sodium chloride 0.9 % injection 3 mL  3 mL Intravenous Q12H Rise Patience, MD   3 mL at 06/30/14 0058     Allergies  Allergen Reactions  . Ciprofloxacin Nausea And Vomiting  . Demerol [Meperidine] Hives  . Lidocaine     palpatations  . Other Swelling    Eye ointments  . Septra [Sulfamethoxazole-Trimethoprim] Nausea And Vomiting  . Codeine Rash  . Novocain [Procaine] Palpitations     Past Medical History  Diagnosis Date  . Anxiety   . Back pain   . H/O bladder problems   . Cholelithiasis   . Depression   . High blood pressure   . Hypercholesteremia   . Kidney stones   . Gum disease   . Complication of anesthesia     bp goes up  . Wears glasses   . Wears dentures     top    Past Surgical History  Procedure Laterality Date  . Colon biopsy    . Mouth  surgery    . Spine surgery    . Colonoscopy     . Tubal ligation    . Lithotripsy    . Rhinoplasty    . Back surgery  1989    lumb lam  . Breast biopsy Right 02/05/2014    invasive ductal cncer  . Abdominal hysterectomy  1982  . Cholecystectomy  2011    lap choli  . Radioactive seed guided mastectomy with axillary sentinel lymph node biopsy Right 02/27/2014    Procedure: RADIOACTIVE SEED GUIDED RIGHT PARTIAL MASTECTOMY WITH AXILLARY SENTINEL LYMPH NODE BIOPSY;  Surgeon: Stark Klein, MD;  Location: [place];  Service: General;  Laterality: Right;  . Portacath placement N/A 02/27/2014    Procedure: INSERTION PORT-A-CATH;  Surgeon: Stark Klein, MD;  Location: [place];  Service: General;  Laterality: N/A;    History reviewed. No pertinent family history.  Social History:  reports that she quit smoking about 6 weeks ago. She does not have any smokeless tobacco history on file. She reports that she does not drink alcohol or use illicit drugs.   Review of Systems: Constitutional:  denies fever, chills, diaphoresis, appetite change and fatigue.  HEENT: denies photophobia, eye pain, redness, hearing loss, ear pain, congestion, sore throat, rhinorrhea, sneezing, neck pain, neck stiffness and tinnitus.  Respiratory: admits to SOB, DOE, recently   Cardiovascular: denies chest pain, palpitations and leg swelling.  Gastrointestinal: denies nausea, vomiting, abdominal pain, diarrhea, constipation, blood in stool.  Genitourinary: denies dysuria, urgency, frequency, hematuria, flank pain and difficulty urinating.  Musculoskeletal: denies  myalgias, back pain, joint swelling, arthralgias and gait problem.   Skin: denies pallor, rash and wound.  Neurological: denies dizziness, seizures, syncope, weakness, light-headedness, numbness and headaches.   Hematological: denies adenopathy, easy bruising, personal or family bleeding history.  Psychiatric/ Behavioral: denies suicidal ideation, mood changes, confusion,  nervousness, sleep disturbance and agitation.    Physical Exam: BP 110/56 mmHg  Pulse 107  Temp(Src) 98 F (36.7 C) (Oral)  Resp 18  Ht 5\' 3"  (1.6 m)  Wt 183 lb 12.8 oz (83.371 kg)  BMI 32.57 kg/m2  SpO2 99%  Wt Readings from Last 3 Encounters:  06/30/14 183 lb 12.8 oz (83.371 kg)  06/22/14 184 lb 1.6 oz (83.507 kg)  06/08/14 183 lb 6.4 oz (83.19 kg)    General: Vital signs reviewed and noted. Well-developed, well-nourished, in no acute distress; alert,   Head: Normocephalic, atraumatic, sclera anicteric,   Neck: Supple. Negative for carotid bruits. No JVD   Lungs:  Clear bilaterally, no  wheezes, rales, or rhonchi. Breathing is normal   Heart: RRR with S1 S2. No murmurs, rubs, or gallops ,  Central access port in her right upper chest   Abdomen/ GI :  Soft, non-tender, non-distended with normoactive bowel sounds. No hepatomegaly. No rebound/guarding. No obvious abdominal masses   MSK: Strength and the appear normal for age.   Extremities: No clubbing or cyanosis. No edema.  Distal pedal pulses are 2+ and equal   Neurologic:  CN are grossly intact,  No obvious motor or sensory defect.  Alert and oriented X 3. Moves all extremities spontaneously.  Psych: Responds to questions appropriately with a normal affect.     Lab results: Basic Metabolic Panel:  Recent Labs Lab 06/29/14 1028 06/29/14 1740 06/29/14 2340 06/30/14 0530  NA 143 139  --  136  K 3.6 2.8*  --  3.5  CL  --  108  --  107  CO2 24 25  --  24  GLUCOSE 127 183*  --  196*  BUN 8.7 9  --  11  CREATININE 1.0 1.05  --  0.98  CALCIUM 9.1 8.3*  --  8.1*  MG  --   --  1.9  --     Liver Function Tests:  Recent Labs Lab 06/29/14 1028 06/29/14 1740  AST 21 31  ALT 13 13  ALKPHOS 80 66  BILITOT 0.43 0.6  PROT 6.1* 6.0  ALBUMIN 3.0* 3.0*   No results for input(s): LIPASE, AMYLASE in the last 168 hours. No results for input(s): AMMONIA in the last 168 hours.  CBC:  Recent Labs Lab 06/29/14 1028  06/29/14 1740 06/30/14 0530  WBC 3.9 4.1 4.2  NEUTROABS 2.7  --   --   HGB 10.3* 9.5* 8.7*  HCT 32.2* 30.4* 27.1*  MCV 86.3 87.6 86.9  PLT 191 196 189    Cardiac Enzymes:  Recent Labs Lab 06/29/14 2340 06/30/14 0530  CKTOTAL 34  --   TROPONINI 0.33* 0.24*    BNP: Invalid input(s): POCBNP  CBG: No results for input(s): GLUCAP in the last 168 hours.  Coagulation Studies: No results for input(s): LABPROT, INR in the last 72 hours.   Other results: Personal review of EKG shows :  -  Sinus tach at 109.  NS ST abn.    Imaging: Dg Chest 2 View  06/30/2014   CLINICAL DATA:  Pleural effusion.  Diuresis.  EXAM: CHEST  2 VIEW  COMPARISON:  CT 06/29/2014.  Chest x-ray 06/29/2014.  FINDINGS: Power port catheter in good anatomic position. Mediastinal structures are normal. Lungs are clear of acute infiltrates. Small right pleural effusion. Improved from prior exam. No pneumothorax. No acute bony abnormality. Surgical clips right chest.  IMPRESSION: 1. Partial catheter in good anatomic position. 2. Small right pleural effusion.  Improved from prior exam.   Electronically Signed   By: [place]   On: 06/30/2014 08:19   Dg Chest 2 View  06/29/2014   CLINICAL DATA:  Short of breath  EXAM: CHEST  2 VIEW  COMPARISON:  05/24/2014  FINDINGS: Heart size upper normal. Mild vascular congestion. Slight increase in right pleural effusion which is small. Small left effusion unchanged. Increase in right lower lobe airspace disease which may be atelectasis or infiltrate or possibly edema. Port-A-Cath tip in the SVC.  IMPRESSION: Slight increase in right lower lobe airspace disease and right effusion. This may represent fluid overload or less likely pneumonia. There is underlying pulmonary vascular congestion.   Electronically Signed   By: Franchot Gallo M.D.   On: 06/29/2014 18:45   Ct Angio Chest Pe W/cm &/or Wo Cm  06/29/2014   CLINICAL DATA:  69 year old with current history of breast cancer  for which she underwent chemotherapy earlier today. She presents with progressively worsening shortness of breath which began last night. Wheezing on auscultation.  EXAM: CT ANGIOGRAPHY CHEST WITH CONTRAST  TECHNIQUE: Multidetector CT imaging of the chest was performed using the standard protocol during bolus administration of intravenous contrast. Multiplanar CT image reconstructions and MIPs were obtained to evaluate the vascular anatomy.  CONTRAST:  142mL OMNIPAQUE IOHEXOL 350 MG/ML IV.  COMPARISON:  No prior CT.  Multiple prior chest x-rays.  FINDINGS: Contrast opacification of the pulmonary arteries is good. No filling defects within either main pulmonary artery or their branches in either lung to suggest pulmonary embolism. Heart enlarged with left ventricular predominance and left ventricular hypertrophy. At  least moderate if not severe 3 vessel coronary atherosclerosis. No pericardial effusion. Moderate atherosclerosis involving the thoracic and upper abdominal aorta without aneurysm.  Emphysematous changes throughout both lungs, particularly the upper lobes. Large right and moderate-sized bland left pleural effusions. Consolidation with air bronchograms in the lower lobes, right greater than left. Diffuse interstitial opacities throughout both lungs and patchy airspace opacities in the upper lobes and superior segment left lower lobe. Central airways patent with mild central bronchial wall thickening.  Numerous normal-sized lymph nodes throughout the mediastinum and in both hila. No pathologic lymphadenopathy in the chest, including either axilla. Thyroid gland normal in appearance.  Mild diffuse enlargement of both adrenal glands without nodularity. Visualized upper abdomen otherwise unremarkable for the early arterial phase of enhancement. Bone window images demonstrate degenerative disc disease and spondylosis throughout the lower cervical and thoracic spine with chronic disc protrusions at T6-7, T8- 9  and T11-12 calcification in posterior annular fibers, but no evidence of osseous metastatic disease.  Review of the MIP images confirms the above findings.  IMPRESSION: 1. No evidence of pulmonary embolism. 2. Cardiomegaly with left ventricular predominance and left ventricular hypertrophy. 3. Mild CHF superimposed upon COPD/emphysema. 4. Bilateral pleural effusions, large on the right and moderate-sized on the left, with associated consolidation in the lower lobes. Passive atelectasis is favored over pneumonia. 5. Bilateral adrenal hyperplasia.   Electronically Signed   By: Evangeline Dakin M.D.   On: 06/29/2014 19:55      Last Echo   Left ventricle: The cavity size was normal. Wall thickness was increased in a pattern of mild LVH. Systolic function was normal. The estimated ejection fraction was in the range of 60% to 65%. Wall motion was normal; there were no regional wall motion abnormalities. Doppler parameters are consistent with abnormal left ventricular relaxation (grade 1 diastolic dysfunction). - Right ventricle: The cavity size was normal. Systolic function was normal. - Impressions: Global longitudinal strian is (-)15.4%.   CXR (personal review)  - very small right pleural effusion.       Assessment & Plan:  1. Acute diastolic CHF:  She is on Adriamycin.  Her echo in Oct. Showed the initial stages of CHf with a reduced global longitudinal strain of -15.4%.   Will repeat her echo today.  Lisinopril has been added.   2. Essential HTN:  She was on Diovan / HCTZ for years but stopped it when her BP dropped ( after she started chemotherapy).  Its' possible that she needs a low dose diuretic to keep her volume status stable. We discussed low dose lasix and also alternate day dosing if the QD dosing causes her to have orthostasis.  3. Breast cancer :  Plans per Oncology.       Thayer Headings, Brooke Bonito., MD, [facility] 06/30/2014, 8:35 AM Office - [phone] Pager [phone]

## 2014-06-30 NOTE — Progress Notes (Addendum)
[facility] Progress Note   Cheyenne Riggs DPO:[id number] DOB: [birth year] DOA: 06/29/2014 PCP: Gennette Pac, MD  Brief narrative: Cheyenne Riggs is a 69 y.o. femalewith history of breast cancer s/p lumpectomy with negative nodes on chemotherapy-  last chemotherapy received was on 2/15- she presents to the ER because of shortness of breath and is found to have b/o pleural effusions.    Subjective: Dyspnea much improved. No cough or chest pain.   Assessment/Plan: Principal Problem:   CHF (congestive heart failure) with pleural effusions- unspecified whether systolic or diastolic - f/u on ECHO - cont Lasix - check pulse ox on room air- wean O2 as able  Active Problems: Mildly elevated Troponin - in setting of CHF- no chest pain - further recommendations by cardiology - on ASA 325 mg daily    Breast cancer of upper-outer quadrant of right female breast - due to receive 12 cycles of Adriamycin out of which she has received 2 -  if it is causing cardiomyopathy, it may need to be discontinued - await ECHO Report- [place] onc[place]    Essential hypertension, benign - this resolved after starting chemo and BP was actually low therefore her Diovan/ HCTZ was discontinued - she has been started on low dose Lisinopril in context of CHF diagnosis   Code Status: Full code Family Communication:  Disposition Plan: home when stable DVT prophylaxis: Heparin  Consultants: cardiology  Procedures:   Antibiotics: Anti-infectives    None         Objective: Filed Weights   06/29/14 2220 06/30/14 0547  Weight: 76.34 kg (168 lb 4.8 oz) 83.371 kg (183 lb 12.8 oz)    Intake/Output Summary (Last 24 hours) at 06/30/14 1034 Last data filed at 06/30/14 0533  Gross per 24 hour  Intake     10 ml  Output   1351 ml  Net  -1341 ml     Vitals Filed Vitals:   06/29/14 2220 06/30/14 0547 06/30/14 0824 06/30/14 0826  BP: 137/76 109/70 122/62 110/56   Pulse: 113 99 99 107  Temp: 97.7 F (36.5 C) 98 F (36.7 C)    TempSrc: Oral Oral    Resp: 28 20 18 18   Height: 5\' 3"  (1.6 m)     Weight: 76.34 kg (168 lb 4.8 oz) 83.371 kg (183 lb 12.8 oz)    SpO2: 93% 98% 99% 99%    Exam: General: AAO x3, No acute respiratory distress Lungs: Clear to auscultation bilaterally without wheezes or crackles Cardiovascular: Regular rate and rhythm without murmur gallop or rub normal S1 and S2 Abdomen: Nontender, nondistended, soft, bowel sounds positive, no rebound, no ascites, no appreciable mass Extremities: No significant cyanosis, clubbing, or edema bilateral lower extremities  Data Reviewed: Basic Metabolic Panel:  Recent Labs Lab 06/29/14 1028 06/29/14 1740 06/29/14 2340 06/30/14 0530  NA 143 139  --  136  K 3.6 2.8*  --  3.5  CL  --  108  --  107  CO2 24 25  --  24  GLUCOSE 127 183*  --  196*  BUN 8.7 9  --  11  CREATININE 1.0 1.05  --  0.98  CALCIUM 9.1 8.3*  --  8.1*  MG  --   --  1.9  --    Liver Function Tests:  Recent Labs Lab 06/29/14 1028 06/29/14 1740  AST 21 31  ALT 13 13  ALKPHOS 80 66  BILITOT 0.43 0.6  PROT  6.1* 6.0  ALBUMIN 3.0* 3.0*   No results for input(s): LIPASE, AMYLASE in the last 168 hours. No results for input(s): AMMONIA in the last 168 hours. CBC:  Recent Labs Lab 06/29/14 1028 06/29/14 1740 06/30/14 0530  WBC 3.9 4.1 4.2  NEUTROABS 2.7  --   --   HGB 10.3* 9.5* 8.7*  HCT 32.2* 30.4* 27.1*  MCV 86.3 87.6 86.9  PLT 191 196 189   Cardiac Enzymes:  Recent Labs Lab 06/29/14 2340 06/30/14 0530  CKTOTAL 34  --   TROPONINI 0.33* 0.24*   BNP (last 3 results)  Recent Labs  06/29/14 1740  BNP 534.8*    ProBNP (last 3 results) No results for input(s): PROBNP in the last 8760 hours.  CBG: No results for input(s): GLUCAP in the last 168 hours.  Recent Results (from the past 240 hour(s))  TECHNOLOGIST REVIEW     Status: None   Collection Time: 06/29/14 10:28 AM  Result Value  Ref Range Status   Technologist Review Metas and Myelocytes present  Final     Studies:  Recent x-ray studies have been reviewed in detail by the Attending Physician  Scheduled Meds:  Scheduled Meds: . aspirin EC  325 mg Oral Daily  . cholecalciferol  1,000 Units Oral Daily  . FLUoxetine  10 mg Oral Daily  . furosemide  40 mg Intravenous Q12H  . heparin  5,000 Units Subcutaneous 3 times per day  . [START ON 07/06/2014] lidocaine-prilocaine  1 application Topical Weekly  . lisinopril  2.5 mg Oral Daily  . sodium chloride  3 mL Intravenous Q12H  . sodium chloride  3 mL Intravenous Q12H   Continuous Infusions:   Time spent on care of this patient: 35 min   [place], MD 06/30/2014, 10:34 AM  LOS: 1 day   [facility] Office  [phone] Pager - Text Page per [email or web address]  If 7PM-7AM, please contact night-coverage [email or web address]

## 2014-06-30 NOTE — Care Management Note (Addendum)
    Page 1 of 2   07/01/2014     10:47:19 AM CARE MANAGEMENT NOTE 07/01/2014  Patient:  Cheyenne Riggs, Cheyenne Riggs   Account Number:  [id number]  Date Initiated:  06/30/2014  Documentation initiated by:  Dessa Phi  Subjective/Objective Assessment:   69 y/o f admitted w/CHF.     Action/Plan:   From home alone.Has pcp,pharmacy.active w/[facility]-HHPT/HHRN   Anticipated DC Date:  07/01/2014   Anticipated DC Plan:  [place]  CM consult      [facility] Choice  Resumption Of Svcs/PTA Provider   Choice offered to / List presented to:  C-1 Patient        [place] arranged  HH-1 RN  [place] agency  [place]   Status of service:  Completed, signed off Medicare Important Message given?   (If response is "NO", the following Medicare IM given date fields will be blank) Date Medicare IM given:   Medicare IM given by:   Date Additional Medicare IM given:   Additional Medicare IM given by:    Discharge Disposition:  [place]  Per UR Regulation:  Reviewed for med. necessity/level of care/duration of stay  If discussed at Progress of Stay Meetings, dates discussed:    Comments:  07/01/14 Dessa Phi RN BSN NCM [phone] d/c home today w/HHC.TC [facility] rep Sheppard Evens tel#[phone] aware of [place] orders, & d/c. No further d/c needs.  06/30/14 Dessa Phi RN BSN NCM [phone] Confirmed w/[facility]-[place] active w/HHPT/RN. Patient wants Pete(therapist).await final HHPT/RN order, face to face. Monitor progress & d/c needs.No anticipated d/c needs.

## 2014-06-30 NOTE — Progress Notes (Signed)
  Echocardiogram 2D Echocardiogram has been performed.  Delia Sitar FRANCES 06/30/2014, 9:43 AM

## 2014-06-30 NOTE — Progress Notes (Signed)
INITIAL NUTRITION ASSESSMENT  DOCUMENTATION CODES Per approved criteria  -Obesity Unspecified   INTERVENTION: Encouraged PO intake At patient's request, RD to follow-up with Heart Healthy and High Protein nutrition education RD to continue to monitor  NUTRITION DIAGNOSIS: Increased nutrient needs related to breast cancer and associated treatment as evidenced by estimated nutritional needs.   Goal: Pt to meet >/= 90% of their estimated nutrition needs   Monitor:  PO and supplemental intake, weight, labs, I/O's  Reason for Assessment: Pt identified as at nutrition risk on the Malnutrition Screen Tool  Admitting Dx: CHF (congestive heart failure)  ASSESSMENT: 69 y.o. female history of breast cancer on chemotherapy last chemotherapy received was today presents to the ER because of shortness of breath.  Pt last nutritionally assessed on a previous admission (1/04) for side effects associated with chemotherapy.  Pt presents with weight loss since that admission (19 lb), question if this is d/t fluid loss. Pt with possibly chemo-induced CHF.  Pt reports weight loss but states she is not concerned about it and would like to lose more despite her being advised against weight loss during chemotherapy. RD offered supplement to patient but pt declined. Discussed with pt the importance of eating 3 meals a day with snacks, emphasizing protein consumption. Suggested high protein snacks between meals.   Pt states her doctor gave her a 3 week break from chemo where her appetite was back to normal then when she started chemo again, her appetite has since declined.   Pt requests information at discharge regarding a low salt diet and high protein. RD to follow-up.  Nutrition focused physical exam shows no sign of depletion of muscle mass or body fat.   Labs reviewed:  Glucose 196  Height: Ht Readings from Last 1 Encounters:  06/29/14 5\' 3"  (1.6 m)    Weight: Wt Readings from Last 1  Encounters:  06/30/14 183 lb 12.8 oz (83.371 kg)    Ideal Body Weight: 115 lb  % Ideal Body Weight: 159%  Wt Readings from Last 10 Encounters:  06/30/14 183 lb 12.8 oz (83.371 kg)  06/22/14 184 lb 1.6 oz (83.507 kg)  06/08/14 183 lb 6.4 oz (83.19 kg)  05/18/14 202 lb 9.6 oz (91.9 kg)  05/11/14 195 lb 9.6 oz (88.724 kg)  05/06/14 192 lb 14.4 oz (87.499 kg)  04/27/14 198 lb 12.8 oz (90.175 kg)  04/20/14 202 lb 8 oz (91.853 kg)  04/13/14 207 lb 9.6 oz (94.167 kg)  04/06/14 203 lb 14.4 oz (92.488 kg)    Usual Body Weight: 207 lb  % Usual Body Weight: 88%  BMI:  Body mass index is 32.57 kg/(m^2).  Estimated Nutritional Needs: Kcal: 1800-2000 Protein: 80-90g Fluid: 1.8L/day  Skin: intact  Diet Order: Diet Heart  EDUCATION NEEDS: -Education needs addressed   Intake/Output Summary (Last 24 hours) at 06/30/14 1010 Last data filed at 06/30/14 0533  Gross per 24 hour  Intake     10 ml  Output   1351 ml  Net  -1341 ml    Last BM: 2/15  Labs:   Recent Labs Lab 06/29/14 1028 06/29/14 1740 06/29/14 2340 06/30/14 0530  NA 143 139  --  136  K 3.6 2.8*  --  3.5  CL  --  108  --  107  CO2 24 25  --  24  BUN 8.7 9  --  11  CREATININE 1.0 1.05  --  0.98  CALCIUM 9.1 8.3*  --  8.1*  MG  --   --  1.9  --   GLUCOSE 127 183*  --  196*    CBG (last 3)  No results for input(s): GLUCAP in the last 72 hours.  Scheduled Meds: . aspirin EC  325 mg Oral Daily  . cholecalciferol  1,000 Units Oral Daily  . FLUoxetine  10 mg Oral Daily  . furosemide  40 mg Intravenous Q12H  . heparin  5,000 Units Subcutaneous 3 times per day  . [START ON 07/06/2014] lidocaine-prilocaine  1 application Topical Weekly  . lisinopril  2.5 mg Oral Daily  . sodium chloride  3 mL Intravenous Q12H  . sodium chloride  3 mL Intravenous Q12H    Continuous Infusions:   Past Medical History  Diagnosis Date  . Anxiety   . Back pain   . H/O bladder problems   . Cholelithiasis   . Depression    . High blood pressure   . Hypercholesteremia   . Kidney stones   . Gum disease   . Complication of anesthesia     bp goes up  . Wears glasses   . Wears dentures     top    Past Surgical History  Procedure Laterality Date  . Colon biopsy    . Mouth surgery    . Spine surgery    . Colonoscopy    . Tubal ligation    . Lithotripsy    . Rhinoplasty    . Back surgery  1989    lumb lam  . Breast biopsy Right 02/05/2014    invasive ductal cncer  . Abdominal hysterectomy  1982  . Cholecystectomy  2011    lap choli  . Radioactive seed guided mastectomy with axillary sentinel lymph node biopsy Right 02/27/2014    Procedure: RADIOACTIVE SEED GUIDED RIGHT PARTIAL MASTECTOMY WITH AXILLARY SENTINEL LYMPH NODE BIOPSY;  Surgeon: Stark Klein, MD;  Location: [facility];  Service: General;  Laterality: Right;  . Portacath placement N/A 02/27/2014    Procedure: INSERTION PORT-A-CATH;  Surgeon: Stark Klein, MD;  Location: [facility];  Service: General;  Laterality: N/A;    Cheyenne Bibles, MS, RD, LDN Pager: [phone] After Hours Pager: [phone]

## 2014-07-01 DIAGNOSIS — C50411 Malignant neoplasm of upper-outer quadrant of right female breast: Secondary | ICD-10-CM

## 2014-07-01 DIAGNOSIS — I5031 Acute diastolic (congestive) heart failure: Secondary | ICD-10-CM

## 2014-07-01 LAB — BASIC METABOLIC PANEL
ANION GAP: 8 (ref 5–15)
BUN: 13 mg/dL (ref 6–23)
CHLORIDE: 104 mmol/L (ref 96–112)
CO2: 30 mmol/L (ref 19–32)
CREATININE: 1.07 mg/dL (ref 0.50–1.10)
Calcium: 8.5 mg/dL (ref 8.4–10.5)
GFR, EST AFRICAN AMERICAN: 60 mL/min — AB (ref >90)
GFR, EST NON AFRICAN AMERICAN: 52 mL/min — AB (ref >90)
Glucose, Bld: 95 mg/dL (ref 70–99)
POTASSIUM: 2.8 mmol/L — AB (ref 3.5–5.1)
Sodium: 142 mmol/L (ref 135–145)

## 2014-07-01 MED ORDER — LISINOPRIL 40 MG PO TABS: 40.0000 mg | ORAL_TABLET | Freq: Every day | ORAL | Status: DC

## 2014-07-01 MED ORDER — POTASSIUM CHLORIDE CRYS ER 20 MEQ PO TBCR: 20.0000 meq | EXTENDED_RELEASE_TABLET | Freq: Every day | ORAL | Status: DC | PRN

## 2014-07-01 MED ORDER — HEPARIN SOD (PORK) LOCK FLUSH 100 UNIT/ML IV SOLN
500.0000 [IU] | INTRAVENOUS | Status: AC | PRN
  Administered 2014-07-01: 500 [IU]

## 2014-07-01 MED ORDER — POTASSIUM CHLORIDE CRYS ER 20 MEQ PO TBCR: 20.0000 meq | EXTENDED_RELEASE_TABLET | Freq: Once | ORAL | Status: DC

## 2014-07-01 MED ORDER — POTASSIUM CHLORIDE CRYS ER 20 MEQ PO TBCR
60.0000 meq | EXTENDED_RELEASE_TABLET | Freq: Once | ORAL | Status: AC
  Administered 2014-07-01: 60 meq via ORAL
  Filled 2014-07-01: qty 3

## 2014-07-01 MED ORDER — FUROSEMIDE 40 MG PO TABS: 40.0000 mg | ORAL_TABLET | Freq: Every day | ORAL | Status: DC | PRN

## 2014-07-01 MED ORDER — FUROSEMIDE 40 MG PO TABS: 40.0000 mg | ORAL_TABLET | Freq: Every day | ORAL | Status: DC

## 2014-07-01 NOTE — Progress Notes (Signed)
PROGRESS NOTE  Subjective:    Cheyenne Riggs is a [name] with hx of HTN, history of breast cancer on chemotherapy last chemotherapy received was today presents to the ER because of shortness of breath.  She had an episode fo dyspnea the day prior to admission. Resolved on its own. No fever, cough.  Has had DOE and fatigue for the past several days.   Her echo yesterday showed normal LV systolic function.  Mild MR, Mild AI. Global longitudinal strain was -17.4% ( compared to previous echo GLS of -15.4% )  She has been off her diovan - BP has been well controlled ( since she lost weight after starting chemo  Objective:    Vital Signs:   Temp:  [97.5 F (36.4 C)-98.1 F (36.7 C)] 98.1 F (36.7 C) (02/17 0526) Pulse Rate:  [84-107] 84 (02/17 0526) Resp:  [18-20] 20 (02/17 0526) BP: (95-123)/(53-62) 95/53 mmHg (02/17 0526) SpO2:  [96 %-100 %] 96 % (02/17 0626) Weight:  [169 lb 4.8 oz (76.794 kg)] 169 lb 4.8 oz (76.794 kg) (02/17 0526)  Last BM Date: 06/30/14   24-hour weight change: Weight change: 1 lb (0.454 kg)  Weight trends: Filed Weights   06/29/14 2220 06/30/14 0547 07/01/14 0526  Weight: 168 lb 4.8 oz (76.34 kg) 183 lb 12.8 oz (83.371 kg) 169 lb 4.8 oz (76.794 kg)    Intake/Output:  02/16 0701 - 02/17 0700 In: 120 [P.O.:120] Out: 1150 [Urine:1150]     Physical Exam: BP 95/53 mmHg  Pulse 84  Temp(Src) 98.1 F (36.7 C) (Oral)  Resp 20  Ht 5\' 3"  (1.6 m)  Wt 169 lb 4.8 oz (76.794 kg)  BMI 30.00 kg/m2  SpO2 96%  Wt Readings from Last 3 Encounters:  07/01/14 169 lb 4.8 oz (76.794 kg)  06/22/14 184 lb 1.6 oz (83.507 kg)  06/08/14 183 lb 6.4 oz (83.19 kg)    General: Vital signs reviewed and noted.   Head: Normocephalic, atraumatic.  Eyes: conjunctivae/corneas clear.  EOM's intact.   Throat: normal  Neck:  normal   Lungs:    clear   Heart:  RR  Abdomen:  Soft, non-tender, non-distended    Extremities: No edema  , slight petechial  rash  on arms .   Neurologic: A&O X3, CN II - XII are grossly intact.   Psych: Normal     Labs: BMET:  Recent Labs  06/29/14 1740 06/29/14 2340 06/30/14 0530  NA 139  --  136  K 2.8*  --  3.5  CL 108  --  107  CO2 25  --  24  GLUCOSE 183*  --  196*  BUN 9  --  11  CREATININE 1.05  --  0.98  CALCIUM 8.3*  --  8.1*  MG  --  1.9  --     Liver function tests:  Recent Labs  06/29/14 1028 06/29/14 1740  AST 21 31  ALT 13 13  ALKPHOS 80 66  BILITOT 0.43 0.6  PROT 6.1* 6.0  ALBUMIN 3.0* 3.0*   No results for input(s): LIPASE, AMYLASE in the last 72 hours.  CBC:  Recent Labs  06/29/14 1028 06/29/14 1740 06/30/14 0530  WBC 3.9 4.1 4.2  NEUTROABS 2.7  --   --   HGB 10.3* 9.5* 8.7*  HCT 32.2* 30.4* 27.1*  MCV 86.3 87.6 86.9  PLT 191 196 189    Cardiac Enzymes:  Recent Labs  06/29/14 2340 06/30/14 0530  06/30/14 1035  CKTOTAL 34  --   --   TROPONINI 0.33* 0.24* 0.29*    Coagulation Studies: No results for input(s): LABPROT, INR in the last 72 hours.  Other: Invalid input(s): POCBNP No results for input(s): DDIMER in the last 72 hours. No results for input(s): HGBA1C in the last 72 hours. No results for input(s): CHOL, HDL, LDLCALC, TRIG, CHOLHDL in the last 72 hours.  Recent Labs  06/29/14 2340  TSH 0.918   No results for input(s): VITAMINB12, FOLATE, FERRITIN, TIBC, IRON, RETICCTPCT in the last 72 hours.   Other results:  Tele  ( personally reviewed ) , NSR  Medications:    Infusions:    Scheduled Medications: . aspirin EC  325 mg Oral Daily  . cholecalciferol  1,000 Units Oral Daily  . FLUoxetine  10 mg Oral Daily  . furosemide  40 mg Intravenous Q12H  . heparin  5,000 Units Subcutaneous 3 times per day  . [START ON 07/06/2014] lidocaine-prilocaine  1 application Topical Weekly  . lisinopril  2.5 mg Oral Daily  . sodium chloride  3 mL Intravenous Q12H  . sodium chloride  3 mL Intravenous Q12H    Assessment/ Plan:   Principal  Problem:   CHF (congestive heart failure) Active Problems:   Breast cancer of upper-outer quadrant of right female breast   Essential hypertension, benign   Antineoplastic chemotherapy induced anemia  1. Acute diastolic CHF:  She informed me that she has been eating potato chips for the past 2 weeks and I suspect this may have contributed to her acute diastolic CHF. She has diuresed nicely on IV lasix - I've DCd it. Echo shows well preserved LV function, mild MR , mild AI.  GLS is actually slightly better. Diastolic function was not mentioned.  We've discussed her home lasix dose. She will take BP and weigh herself every day. She will take a lasix 40 mg and Kdur 20 meq if her weight goes up 5 lbs.  I suspect that she will only need the lasix 1-2 times a week if she stays on a low salt diet.  I would hold the Lisinopril for now given her hypotension.  She was not on it at home. I'll see her in the office in 2-3 weeks and will review her weights and BP recordings.  I think she can go home today.  2. Breast cancer:  Continue treatments.  Her LV function has not changed by echo .  3. Essential HTN:  Better, she will be following her BP readings.   4.  Mitral regurgitation - mild , stable  5 Aortic insufficiency - mild . Stable   Disposition: probable DC to home today Would give her Lasix 40 a day PRN and Kdur 20 meq to be taken with the lasix. Continue other meds that she was on at home.   Length of Stay: 2  Thayer Headings, Brooke Bonito., MD, [facility] 07/01/2014, 7:30 AM Office [phone] Pager [phone]

## 2014-07-01 NOTE — Discharge Summary (Signed)
Physician Discharge Summary  Cheyenne Riggs JOI:[id number] DOB: June 08, 1945 DOA: 06/29/2014  PCP: Gennette Pac, MD  Admit date: 06/29/2014 Discharge date: 07/01/2014  Time spent: 30 minutes  Recommendations for Outpatient Follow-up:  1. Follow up with PCP in 1-2 weeks 2. Follow up with Dr. Acie Fredrickson as scheduled in 2-3 weeks 3. Would repeat BMET in 1-3 weeks, focusing on K and renal function  Discharge Diagnoses:  Principal Problem:   CHF (congestive heart failure) Active Problems:   Breast cancer of upper-outer quadrant of right female breast   Essential hypertension, benign   Antineoplastic chemotherapy induced anemia   Discharge Condition: Improved  Diet recommendation: Heart healthy  Filed Weights   06/29/14 2220 06/30/14 0547 07/01/14 0526  Weight: 76.34 kg (168 lb 4.8 oz) 83.371 kg (183 lb 12.8 oz) 76.794 kg (169 lb 4.8 oz)    History of present illness:  Please see admit h and p from 2/15 for details. Briefly, pt presented with sob and found to be in acute congestive heart failure. The patient was admitted for further work up.  Hospital Course:  Acutely decompensated heart failure, likely diastolic - 2d echo with EF of 55-60% - Patient followed by Cardiology - Weight on discharge: 76.79kg - Net neg 2.3L by day of discharge - Cardiology recs for PRN lasix for when wt increases by 5lbs - Per Cardiology, cont to hold lisinopril secondary to soft BP  Elevated troponin - Asymptomatic - Followed by cardiology - Suspect possible troponin lead secondary to above  Breast cancer of upper-outer quadrant - Patient to follow up with Oncology as outpatient  HTN - BP stable - Per Cardiology, would hold lisinopril for now given soft BP  Consultations:  Cardiology  Discharge Exam: Filed Vitals:   06/30/14 1421 06/30/14 2101 07/01/14 0526 07/01/14 0626  BP: 123/56 108/55 95/53   Pulse: 92 90 84   Temp: 97.9 F (36.6 C) 97.5 F (36.4 C) 98.1 F (36.7 C)    TempSrc: Oral Oral Oral   Resp: 18 18 20    Height:      Weight:   76.794 kg (169 lb 4.8 oz)   SpO2: 97% 98% 100% 96%    General: awake, in nad Cardiovascular: regular, s1, s2 Respiratory: normal resp effort, no wheezing  Discharge Instructions     Medication List    TAKE these medications        acetaminophen 325 MG tablet  Commonly known as:  TYLENOL  Take 325 mg by mouth every 6 (six) hours as needed for moderate pain (pain).     cholecalciferol 1000 UNITS tablet  Commonly known as:  VITAMIN D  Take 1,000 Units by mouth daily.     diphenhydrAMINE 25 MG tablet  Commonly known as:  BENADRYL  Take 25 mg by mouth daily as needed for allergies (allergies).     FLUoxetine 10 MG capsule  Commonly known as:  PROZAC  Take 1 capsule (10 mg total) by mouth daily.     furosemide 40 MG tablet  Commonly known as:  LASIX  Take 1 tablet (40 mg total) by mouth daily as needed for fluid (Take a lasix 40 mg and Kdur 20 meq if your weight goes up 5 lbs).     lidocaine-prilocaine cream  Commonly known as:  EMLA  Apply 1 application topically once a week. On Chemo Day (Monday).     loperamide 2 MG capsule  Commonly known as:  IMODIUM  Take 2 mg by mouth daily as needed  for diarrhea or loose stools.     LORazepam 0.5 MG tablet  Commonly known as:  ATIVAN  Take 1 tablet (0.5 mg total) by mouth every 8 (eight) hours as needed. Nausea     MAALOX ADVANCED MAX ST 1000-60 MG Chew  Generic drug:  Calcium Carbonate-Simethicone  Chew 1 tablet by mouth every 6 (six) hours as needed (indigestion). Heart burn     ondansetron 8 MG tablet  Commonly known as:  ZOFRAN  Take 8 mg by mouth daily as needed for nausea (nausea).     potassium chloride SA 20 MEQ tablet  Commonly known as:  K-DUR,KLOR-CON  Take 1 tablet (20 mEq total) by mouth daily as needed (Take a lasix 40 mg and Kdur 20 meq if your weight goes up 5 lbs).     PROBIOTIC DAILY PO  Take 1 tablet by mouth daily.     ranitidine  150 MG tablet  Commonly known as:  ZANTAC  Take 150 mg by mouth 2 (two) times daily as needed for heartburn.       Allergies  Allergen Reactions  . Ciprofloxacin Nausea And Vomiting  . Demerol [Meperidine] Hives  . Lidocaine     palpatations  . Other Swelling    Eye ointments  . Septra [Sulfamethoxazole-Trimethoprim] Nausea And Vomiting  . Codeine Rash  . Novocain [Procaine] Palpitations      The results of significant diagnostics from this hospitalization (including imaging, microbiology, ancillary and laboratory) are listed below for reference.    Significant Diagnostic Studies: Dg Chest 2 View  06/30/2014   CLINICAL DATA:  Pleural effusion.  Diuresis.  EXAM: CHEST  2 VIEW  COMPARISON:  CT 06/29/2014.  Chest x-ray 06/29/2014.  FINDINGS: Power port catheter in good anatomic position. Mediastinal structures are normal. Lungs are clear of acute infiltrates. Small right pleural effusion. Improved from prior exam. No pneumothorax. No acute bony abnormality. Surgical clips right chest.  IMPRESSION: 1. Partial catheter in good anatomic position. 2. Small right pleural effusion.  Improved from prior exam.   Electronically Signed   By: [place]   On: 06/30/2014 08:19   Dg Chest 2 View  06/29/2014   CLINICAL DATA:  Short of breath  EXAM: CHEST  2 VIEW  COMPARISON:  05/24/2014  FINDINGS: Heart size upper normal. Mild vascular congestion. Slight increase in right pleural effusion which is small. Small left effusion unchanged. Increase in right lower lobe airspace disease which may be atelectasis or infiltrate or possibly edema. Port-A-Cath tip in the SVC.  IMPRESSION: Slight increase in right lower lobe airspace disease and right effusion. This may represent fluid overload or less likely pneumonia. There is underlying pulmonary vascular congestion.   Electronically Signed   By: Franchot Gallo M.D.   On: 06/29/2014 18:45   Ct Angio Chest Pe W/cm &/or Wo Cm  06/29/2014   CLINICAL DATA:   69 year old with current history of breast cancer for which she underwent chemotherapy earlier today. She presents with progressively worsening shortness of breath which began last night. Wheezing on auscultation.  EXAM: CT ANGIOGRAPHY CHEST WITH CONTRAST  TECHNIQUE: Multidetector CT imaging of the chest was performed using the standard protocol during bolus administration of intravenous contrast. Multiplanar CT image reconstructions and MIPs were obtained to evaluate the vascular anatomy.  CONTRAST:  144mL OMNIPAQUE IOHEXOL 350 MG/ML IV.  COMPARISON:  No prior CT.  Multiple prior chest x-rays.  FINDINGS: Contrast opacification of the pulmonary arteries is good. No filling defects within either  main pulmonary artery or their branches in either lung to suggest pulmonary embolism. Heart enlarged with left ventricular predominance and left ventricular hypertrophy. At least moderate if not severe 3 vessel coronary atherosclerosis. No pericardial effusion. Moderate atherosclerosis involving the thoracic and upper abdominal aorta without aneurysm.  Emphysematous changes throughout both lungs, particularly the upper lobes. Large right and moderate-sized bland left pleural effusions. Consolidation with air bronchograms in the lower lobes, right greater than left. Diffuse interstitial opacities throughout both lungs and patchy airspace opacities in the upper lobes and superior segment left lower lobe. Central airways patent with mild central bronchial wall thickening.  Numerous normal-sized lymph nodes throughout the mediastinum and in both hila. No pathologic lymphadenopathy in the chest, including either axilla. Thyroid gland normal in appearance.  Mild diffuse enlargement of both adrenal glands without nodularity. Visualized upper abdomen otherwise unremarkable for the early arterial phase of enhancement. Bone window images demonstrate degenerative disc disease and spondylosis throughout the lower cervical and thoracic  spine with chronic disc protrusions at T6-7, T8- 9 and T11-12 calcification in posterior annular fibers, but no evidence of osseous metastatic disease.  Review of the MIP images confirms the above findings.  IMPRESSION: 1. No evidence of pulmonary embolism. 2. Cardiomegaly with left ventricular predominance and left ventricular hypertrophy. 3. Mild CHF superimposed upon COPD/emphysema. 4. Bilateral pleural effusions, large on the right and moderate-sized on the left, with associated consolidation in the lower lobes. Passive atelectasis is favored over pneumonia. 5. Bilateral adrenal hyperplasia.   Electronically Signed   By: Evangeline Dakin M.D.   On: 06/29/2014 19:55    Microbiology: Recent Results (from the past 240 hour(s))  TECHNOLOGIST REVIEW     Status: None   Collection Time: 06/29/14 10:28 AM  Result Value Ref Range Status   Technologist Review Metas and Myelocytes present  Final     Labs: Basic Metabolic Panel:  Recent Labs Lab 06/29/14 1028 06/29/14 1740 06/29/14 2340 06/30/14 0530 07/01/14 0432  NA 143 139  --  136 142  K 3.6 2.8*  --  3.5 2.8*  CL  --  108  --  107 104  CO2 24 25  --  24 30  GLUCOSE 127 183*  --  196* 95  BUN 8.7 9  --  11 13  CREATININE 1.0 1.05  --  0.98 1.07  CALCIUM 9.1 8.3*  --  8.1* 8.5  MG  --   --  1.9  --   --    Liver Function Tests:  Recent Labs Lab 06/29/14 1028 06/29/14 1740  AST 21 31  ALT 13 13  ALKPHOS 80 66  BILITOT 0.43 0.6  PROT 6.1* 6.0  ALBUMIN 3.0* 3.0*   No results for input(s): LIPASE, AMYLASE in the last 168 hours. No results for input(s): AMMONIA in the last 168 hours. CBC:  Recent Labs Lab 06/29/14 1028 06/29/14 1740 06/30/14 0530  WBC 3.9 4.1 4.2  NEUTROABS 2.7  --   --   HGB 10.3* 9.5* 8.7*  HCT 32.2* 30.4* 27.1*  MCV 86.3 87.6 86.9  PLT 191 196 189   Cardiac Enzymes:  Recent Labs Lab 06/29/14 2340 06/30/14 0530 06/30/14 1035  CKTOTAL 34  --   --   TROPONINI 0.33* 0.24* 0.29*   BNP: BNP  (last 3 results)  Recent Labs  06/29/14 1740  BNP 534.8*    ProBNP (last 3 results) No results for input(s): PROBNP in the last 8760 hours.  CBG: No results for input(s):  GLUCAP in the last 168 hours.   Signed:  Virtie Bungert K  [facility] 07/01/2014, 10:00 AM

## 2014-07-06 ENCOUNTER — Ambulatory Visit (HOSPITAL_BASED_OUTPATIENT_CLINIC_OR_DEPARTMENT_OTHER): Payer: Medicare Other

## 2014-07-06 ENCOUNTER — Telehealth: Payer: Self-pay | Admitting: Hematology and Oncology

## 2014-07-06 ENCOUNTER — Other Ambulatory Visit: Payer: Self-pay | Admitting: *Deleted

## 2014-07-06 ENCOUNTER — Other Ambulatory Visit: Payer: Medicare Other

## 2014-07-06 ENCOUNTER — Other Ambulatory Visit (HOSPITAL_BASED_OUTPATIENT_CLINIC_OR_DEPARTMENT_OTHER): Payer: Medicare Other

## 2014-07-06 ENCOUNTER — Ambulatory Visit (HOSPITAL_BASED_OUTPATIENT_CLINIC_OR_DEPARTMENT_OTHER): Payer: Medicare Other | Admitting: Hematology and Oncology

## 2014-07-06 VITALS — BP 136/71 | HR 98 | Temp 98.0°F | Resp 18 | Ht 63.0 in | Wt 180.2 lb

## 2014-07-06 DIAGNOSIS — D6481 Anemia due to antineoplastic chemotherapy: Secondary | ICD-10-CM | POA: Diagnosis not present

## 2014-07-06 DIAGNOSIS — C50411 Malignant neoplasm of upper-outer quadrant of right female breast: Secondary | ICD-10-CM | POA: Diagnosis not present

## 2014-07-06 DIAGNOSIS — D702 Other drug-induced agranulocytosis: Secondary | ICD-10-CM | POA: Diagnosis not present

## 2014-07-06 DIAGNOSIS — E876 Hypokalemia: Secondary | ICD-10-CM | POA: Diagnosis not present

## 2014-07-06 DIAGNOSIS — Z5111 Encounter for antineoplastic chemotherapy: Secondary | ICD-10-CM

## 2014-07-06 LAB — COMPREHENSIVE METABOLIC PANEL (CC13)
ALK PHOS: 73 U/L (ref 40–150)
ALT: 13 U/L (ref 0–55)
AST: 21 U/L (ref 5–34)
Albumin: 3.2 g/dL — ABNORMAL LOW (ref 3.5–5.0)
Anion Gap: 10 mEq/L (ref 3–11)
BUN: 7.9 mg/dL (ref 7.0–26.0)
CALCIUM: 9.4 mg/dL (ref 8.4–10.4)
CHLORIDE: 109 meq/L (ref 98–109)
CO2: 25 mEq/L (ref 22–29)
CREATININE: 1.1 mg/dL (ref 0.6–1.1)
EGFR: 53 mL/min/{1.73_m2} — ABNORMAL LOW (ref >90)
Glucose: 126 mg/dl (ref 70–140)
POTASSIUM: 4 meq/L (ref 3.5–5.1)
Sodium: 143 mEq/L (ref 136–145)
Total Bilirubin: 0.43 mg/dL (ref 0.20–1.20)
Total Protein: 6.1 g/dL — ABNORMAL LOW (ref 6.4–8.3)

## 2014-07-06 LAB — CBC WITH DIFFERENTIAL/PLATELET
BASO%: 1.2 % (ref 0.0–2.0)
Basophils Absolute: 0 10*3/uL (ref 0.0–0.1)
EOS%: 3.5 % (ref 0.0–7.0)
Eosinophils Absolute: 0.1 10*3/uL (ref 0.0–0.5)
HEMATOCRIT: 29.2 % — AB (ref 34.8–46.6)
HEMOGLOBIN: 9.3 g/dL — AB (ref 11.6–15.9)
LYMPH%: 26 % (ref 14.0–49.7)
MCH: 27.9 pg (ref 25.1–34.0)
MCHC: 31.8 g/dL (ref 31.5–36.0)
MCV: 87.7 fL (ref 79.5–101.0)
MONO#: 0.3 10*3/uL (ref 0.1–0.9)
MONO%: 10.9 % (ref 0.0–14.0)
NEUT%: 58.4 % (ref 38.4–76.8)
NEUTROS ABS: 1.5 10*3/uL (ref 1.5–6.5)
Platelets: 243 10*3/uL (ref 145–400)
RBC: 3.33 10*6/uL — AB (ref 3.70–5.45)
RDW: 18.4 % — ABNORMAL HIGH (ref 11.2–14.5)
WBC: 2.6 10*3/uL — ABNORMAL LOW (ref 3.9–10.3)
lymph#: 0.7 10*3/uL — ABNORMAL LOW (ref 0.9–3.3)

## 2014-07-06 MED ORDER — SODIUM CHLORIDE 0.9 % IJ SOLN
10.0000 mL | INTRAMUSCULAR | Status: DC | PRN
  Administered 2014-07-06: 10 mL
  Filled 2014-07-06: qty 10

## 2014-07-06 MED ORDER — ONDANSETRON 8 MG/NS 50 ML IVPB
INTRAVENOUS | Status: AC
  Filled 2014-07-06: qty 8

## 2014-07-06 MED ORDER — LORAZEPAM 0.5 MG PO TABS: 0.5000 mg | ORAL_TABLET | Freq: Three times a day (TID) | ORAL | Status: DC | PRN

## 2014-07-06 MED ORDER — PACLITAXEL PROTEIN-BOUND CHEMO INJECTION 100 MG
80.0000 mg/m2 | Freq: Once | INTRAVENOUS | Status: AC
  Administered 2014-07-06: 150 mg via INTRAVENOUS
  Filled 2014-07-06: qty 30

## 2014-07-06 MED ORDER — ONDANSETRON 8 MG/50ML IVPB (CHCC)
8.0000 mg | Freq: Once | INTRAVENOUS | Status: AC
  Administered 2014-07-06: 8 mg via INTRAVENOUS

## 2014-07-06 MED ORDER — SODIUM CHLORIDE 0.9 % IV SOLN
Freq: Once | INTRAVENOUS | Status: AC
  Administered 2014-07-06: 13:00:00 via INTRAVENOUS

## 2014-07-06 MED ORDER — HEPARIN SOD (PORK) LOCK FLUSH 100 UNIT/ML IV SOLN
500.0000 [IU] | Freq: Once | INTRAVENOUS | Status: AC | PRN
  Administered 2014-07-06: 500 [IU]
  Filled 2014-07-06: qty 5

## 2014-07-06 NOTE — Assessment & Plan Note (Signed)
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 Patient started systemic adjuvant chemotherapy with dose dense Adriamycin Cytoxan. She started chemotherapy in 03/30/2014. Dose of chemotherapy was reduced after cycle 1 for severe neutropenia.   Current treatment: Cycle 3/12 of Abraxane (cycle 1 delayed due to hospitalization with dental infection, neutropenic fever and C. difficile colitis.)   Chemotoxicities: 1. Neutropenia grade 4: Required dose reduction for cycle 2 of chemotherapy and for cycle 4 AC, hospitalization for neutropenic fever after fourth cycle of AC, dose reduction after cycle 1 of Abraxane. 2. Alopecia 3. Chemotherapy-induced nausea : Resolved 4. Chemotherapy-induced diarrhea: Patient went on to get C. difficile enterocolitis and was treated with Flagyl. 5. Chemotherapy-induced severe fatigue: Much improved with treatment break 6. Chemotherapy-induced anemia: Hemoglobin improved to 10.7 7. Hospitalization for neutropenic fever, dental infection with C. difficile colitis: After 4 cycles of Adriamycin and Cytoxan. She is currently off Flagyl and diarrhea has resolved. Currently takes probiotics 8. Hypokalemia: Attributable to prior diarrhea. She was on 80 mEq of potassium might decrease it to 40 mEq. She has been off potassium for the last 4 days. We are awaiting today's potassium result. Since the patient does not have any further diarrhea, I expect the potassium to remain stable   Tobacco cessation: Patient quit smoking since chemotherapy and does not even have a desire to smoke.  Return to clinic in 2 weeks for follow-up and weekly for chemotherapy

## 2014-07-06 NOTE — Telephone Encounter (Signed)
added appt per pof....pt will get sched from tx room

## 2014-07-06 NOTE — Progress Notes (Signed)
Patient Care Team: Hulan Fess, MD as PCP - General (Family Medicine)  DIAGNOSIS: Breast cancer of upper-outer quadrant of right female breast   Staging form: Breast, AJCC 7th Edition     Clinical: Stage IA (T1c, N0, cM0) - Signed by Thea Silversmith, MD on 02/18/2014     Pathologic: Stage IIA (T2, N0, cM0) - Signed by Rulon Eisenmenger, MD on 03/12/2014   SUMMARY OF ONCOLOGIC HISTORY:   Breast cancer of upper-outer quadrant of right female breast   02/27/2014 Surgery Right breast lumpectomy: Invasive ductal carcinoma grade 3, 2.5 cm, DCIS intermediate grade, margins negative, 2 SLN negative, ER/PR HER-2 negative   03/30/2014 -  Chemotherapy Adjuvant chemotherapy with dose dense Adriamycin Cytoxan x4 followed by Abraxane weekly x12   05/17/2014 - 05/26/2014 Hospital Admission Neutropenic fever secondary to dental infection which led to C. difficile enterocolitis, atypical pneumonia and SIRS    CHIEF COMPLIANT: Week 3 Abraxane  INTERVAL HISTORY: Cheyenne Riggs is a 69 year old lady with above-mentioned history of right-sided breast cancer currently on adjuvant chemotherapy. She has been tolerating Abraxane very well without any major problems or concerns. She developed severe shortness of breath and had to come to the hospital and was admitted to the hospital and was diagnosed with congestive heart failure. Cardiology was consulted who prescribed her Lasix with improvement in her symptoms. Should an echocardiogram which showed a normal ejection fraction.  REVIEW OF SYSTEMS:   Constitutional: Denies fevers, chills or abnormal weight loss Eyes: Denies blurriness of vision Ears, nose, mouth, throat, and face: Denies mucositis or sore throat Respiratory: Denies cough, dyspnea or wheezes Cardiovascular: Denies palpitation, chest discomfort or lower extremity swelling Gastrointestinal:  Denies nausea, heartburn or change in bowel habits Skin: Denies abnormal skin rashes Lymphatics: Denies new  lymphadenopathy or easy bruising Neurological:Denies numbness, tingling or new weaknesses Behavioral/Psych: Mood is stable, no new changes  Breast:  denies any pain or lumps or nodules in either breasts All other systems were reviewed with the patient and are negative.  I have reviewed the past medical history, past surgical history, social history and family history with the patient and they are unchanged from previous note.  ALLERGIES:  is allergic to ciprofloxacin; demerol; lidocaine; other; septra; codeine; and novocain.  MEDICATIONS:  Current Outpatient Prescriptions  Medication Sig Dispense Refill  . acetaminophen (TYLENOL) 325 MG tablet Take 325 mg by mouth every 6 (six) hours as needed for moderate pain (pain).     . Calcium Carbonate-Simethicone (MAALOX ADVANCED MAX ST) 1000-60 MG CHEW Chew 1 tablet by mouth every 6 (six) hours as needed (indigestion). Heart burn    . cholecalciferol (VITAMIN D) 1000 UNITS tablet Take 1,000 Units by mouth daily.     . diphenhydrAMINE (BENADRYL) 25 MG tablet Take 25 mg by mouth daily as needed for allergies (allergies).     Marland Kitchen FLUoxetine (PROZAC) 10 MG capsule Take 1 capsule (10 mg total) by mouth daily. 30 capsule 1  . furosemide (LASIX) 40 MG tablet Take 1 tablet (40 mg total) by mouth daily as needed for fluid (Take a lasix 40 mg and Kdur 20 meq if your weight goes up 5 lbs). 30 tablet 0  . lidocaine-prilocaine (EMLA) cream Apply 1 application topically once a week. On Chemo Day (Monday).  0  . loperamide (IMODIUM) 2 MG capsule Take 2 mg by mouth daily as needed for diarrhea or loose stools.    Marland Kitchen LORazepam (ATIVAN) 0.5 MG tablet Take 1 tablet (0.5 mg total) by mouth  every 8 (eight) hours as needed. Nausea 30 tablet 0  . ondansetron (ZOFRAN) 8 MG tablet Take 8 mg by mouth daily as needed for nausea (nausea).   1  . potassium chloride SA (K-DUR,KLOR-CON) 20 MEQ tablet Take 1 tablet (20 mEq total) by mouth daily as needed (Take a lasix 40 mg and Kdur 20  meq if your weight goes up 5 lbs). 30 tablet 0  . Probiotic Product (PROBIOTIC DAILY PO) Take 1 tablet by mouth daily.     . ranitidine (ZANTAC) 150 MG tablet Take 150 mg by mouth 2 (two) times daily as needed for heartburn.      No current facility-administered medications for this visit.    PHYSICAL EXAMINATION: ECOG PERFORMANCE STATUS: 1 - Symptomatic but completely ambulatory  Filed Vitals:   07/06/14 1051  BP: 136/71  Pulse: 98  Temp: 98 F (36.7 C)  Resp: 18   Filed Weights   07/06/14 1051  Weight: 180 lb 3.2 oz (81.738 kg)    GENERAL:alert, no distress and comfortable SKIN: skin color, texture, turgor are normal, no rashes or significant lesions EYES: normal, Conjunctiva are pink and non-injected, sclera clear OROPHARYNX:no exudate, no erythema and lips, buccal mucosa, and tongue normal  NECK: supple, thyroid normal size, non-tender, without nodularity LYMPH:  no palpable lymphadenopathy in the cervical, axillary or inguinal LUNGS: clear to auscultation and percussion with normal breathing effort HEART: regular rate & rhythm and no murmurs and no lower extremity edema ABDOMEN:abdomen soft, non-tender and normal bowel sounds Musculoskeletal:no cyanosis of digits and no clubbing  NEURO: alert & oriented x 3 with fluent speech, no focal motor/sensory deficits  LABORATORY DATA:  I have reviewed the data as listed   Chemistry      Component Value Date/Time   NA 143 07/06/2014 1031   NA 142 07/01/2014 0432   K 4.0 07/06/2014 1031   K 2.8* 07/01/2014 0432   CL 104 07/01/2014 0432   CO2 25 07/06/2014 1031   CO2 30 07/01/2014 0432   BUN 7.9 07/06/2014 1031   BUN 13 07/01/2014 0432   CREATININE 1.1 07/06/2014 1031   CREATININE 1.07 07/01/2014 0432      Component Value Date/Time   CALCIUM 9.4 07/06/2014 1031   CALCIUM 8.5 07/01/2014 0432   ALKPHOS 73 07/06/2014 1031   ALKPHOS 66 06/29/2014 1740   AST 21 07/06/2014 1031   AST 31 06/29/2014 1740   ALT 13  07/06/2014 1031   ALT 13 06/29/2014 1740   BILITOT 0.43 07/06/2014 1031   BILITOT 0.6 06/29/2014 1740       Lab Results  Component Value Date   WBC 2.6* 07/06/2014   HGB 9.3* 07/06/2014   HCT 29.2* 07/06/2014   MCV 87.7 07/06/2014   PLT 243 07/06/2014   NEUTROABS 1.5 07/06/2014     ASSESSMENT & PLAN:  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 Patient started systemic adjuvant chemotherapy with dose dense Adriamycin Cytoxan. She started chemotherapy in 03/30/2014. Dose of chemotherapy was reduced after cycle 1 for severe neutropenia.   Current treatment: Cycle 3/12 of Abraxane (cycle 1 delayed due to hospitalization with dental infection, neutropenic fever and C. difficile colitis.)   Chemotoxicities: 1. Neutropenia grade 4: Required dose reduction for cycle 2 of chemotherapy and for cycle 4 AC, hospitalization for neutropenic fever after fourth cycle of  AC, dose reduction after cycle 1 of Abraxane. 2. Alopecia 3. Chemotherapy-induced nausea : Resolved 4. Chemotherapy-induced diarrhea: Patient went on to get C. difficile enterocolitis and was treated with Flagyl. 5. Chemotherapy-induced severe fatigue: Much improved with treatment break 6. Chemotherapy-induced anemia: Hemoglobin being monitored closely today it is 9.3 7. Hospitalization for neutropenic fever, dental infection with C. difficile colitis: After 4 cycles of Adriamycin and Cytoxan. She is currently off Flagyl and diarrhea has resolved. Currently takes probiotics 8. Hypokalemia: Attributable to prior diarrhea. She was on 80 mEq of potassium might decrease it to 40 mEq. She has been off potassium for the last 4 days. We are awaiting today's potassium result. Since the patient does not have any further diarrhea, I expect the potassium to remain stable  9.  Hospitalization for shortness of breath 07/01/2014 diagnosed with CHF treated with Lasix but EF was normal.  Tobacco cessation: Patient quit smoking since chemotherapy and does not even have a desire to smoke.  Return to clinic in 2 weeks for follow-up and weekly for chemotherapy    No orders of the defined types were placed in this encounter.   The patient has a good understanding of the overall plan. she agrees with it. She will call with any problems that may develop before her next visit here.   Rulon Eisenmenger, MD

## 2014-07-06 NOTE — Progress Notes (Signed)
Ok to treat today with WBC 2.6 and ANC 1.5 per Dr Geralyn Flash office note.

## 2014-07-06 NOTE — Patient Instructions (Signed)
[facility] Discharge Instructions for Patients Receiving Chemotherapy  Today you received the following chemotherapy agents abraxane.   To help prevent nausea and vomiting after your treatment, we encourage you to take your nausea medication as directed.   If you develop nausea and vomiting that is not controlled by your nausea medication, call the clinic.   BELOW ARE SYMPTOMS THAT SHOULD BE REPORTED IMMEDIATELY:  *FEVER GREATER THAN 100.5 F  *CHILLS WITH OR WITHOUT FEVER  NAUSEA AND VOMITING THAT IS NOT CONTROLLED WITH YOUR NAUSEA MEDICATION  *UNUSUAL SHORTNESS OF BREATH  *UNUSUAL BRUISING OR BLEEDING  TENDERNESS IN MOUTH AND THROAT WITH OR WITHOUT PRESENCE OF ULCERS  *URINARY PROBLEMS  *BOWEL PROBLEMS  UNUSUAL RASH Items with * indicate a potential emergency and should be followed up as soon as possible.  Feel free to call the clinic you have any questions or concerns. The clinic phone number is [phone].  

## 2014-07-08 DIAGNOSIS — A047 Enterocolitis due to Clostridium difficile: Secondary | ICD-10-CM | POA: Diagnosis not present

## 2014-07-08 DIAGNOSIS — D630 Anemia in neoplastic disease: Secondary | ICD-10-CM | POA: Diagnosis not present

## 2014-07-08 DIAGNOSIS — C50411 Malignant neoplasm of upper-outer quadrant of right female breast: Secondary | ICD-10-CM | POA: Diagnosis not present

## 2014-07-08 DIAGNOSIS — M6281 Muscle weakness (generalized): Secondary | ICD-10-CM | POA: Diagnosis not present

## 2014-07-08 DIAGNOSIS — I10 Essential (primary) hypertension: Secondary | ICD-10-CM | POA: Diagnosis not present

## 2014-07-08 DIAGNOSIS — F329 Major depressive disorder, single episode, unspecified: Secondary | ICD-10-CM | POA: Diagnosis not present

## 2014-07-09 ENCOUNTER — Encounter: Payer: Self-pay | Admitting: Hematology and Oncology

## 2014-07-09 NOTE — Telephone Encounter (Signed)
Called patient and she states that she had 4 episodes of diarrhea today. Advised patient to take Immodium. Also advised patient to push fluids and to notify us if symptoms worsen. Patient verbalized understanding.

## 2014-07-13 ENCOUNTER — Other Ambulatory Visit: Payer: Self-pay | Admitting: Hematology and Oncology

## 2014-07-13 ENCOUNTER — Other Ambulatory Visit (HOSPITAL_BASED_OUTPATIENT_CLINIC_OR_DEPARTMENT_OTHER): Payer: Medicare Other

## 2014-07-13 ENCOUNTER — Ambulatory Visit (HOSPITAL_BASED_OUTPATIENT_CLINIC_OR_DEPARTMENT_OTHER): Payer: Medicare Other

## 2014-07-13 DIAGNOSIS — Z452 Encounter for adjustment and management of vascular access device: Secondary | ICD-10-CM | POA: Diagnosis not present

## 2014-07-13 DIAGNOSIS — C50411 Malignant neoplasm of upper-outer quadrant of right female breast: Secondary | ICD-10-CM | POA: Diagnosis not present

## 2014-07-13 DIAGNOSIS — Z5111 Encounter for antineoplastic chemotherapy: Secondary | ICD-10-CM | POA: Diagnosis not present

## 2014-07-13 LAB — COMPREHENSIVE METABOLIC PANEL (CC13)
ALBUMIN: 3.1 g/dL — AB (ref 3.5–5.0)
ALT: 11 U/L (ref 0–55)
AST: 20 U/L (ref 5–34)
Alkaline Phosphatase: 79 U/L (ref 40–150)
Anion Gap: 11 mEq/L (ref 3–11)
BILIRUBIN TOTAL: 0.49 mg/dL (ref 0.20–1.20)
BUN: 7.8 mg/dL (ref 7.0–26.0)
CALCIUM: 9.3 mg/dL (ref 8.4–10.4)
CO2: 24 meq/L (ref 22–29)
Chloride: 108 mEq/L (ref 98–109)
Creatinine: 1 mg/dL (ref 0.6–1.1)
EGFR: 58 mL/min/{1.73_m2} — ABNORMAL LOW (ref >90)
Glucose: 168 mg/dl — ABNORMAL HIGH (ref 70–140)
Potassium: 3.4 mEq/L — ABNORMAL LOW (ref 3.5–5.1)
SODIUM: 142 meq/L (ref 136–145)
TOTAL PROTEIN: 5.9 g/dL — AB (ref 6.4–8.3)

## 2014-07-13 LAB — CBC WITH DIFFERENTIAL/PLATELET
BASO%: 1.4 % (ref 0.0–2.0)
Basophils Absolute: 0 10*3/uL (ref 0.0–0.1)
EOS ABS: 0 10*3/uL (ref 0.0–0.5)
EOS%: 2.3 % (ref 0.0–7.0)
HCT: 28.7 % — ABNORMAL LOW (ref 34.8–46.6)
HGB: 9.1 g/dL — ABNORMAL LOW (ref 11.6–15.9)
LYMPH%: 29.6 % (ref 14.0–49.7)
MCH: 28 pg (ref 25.1–34.0)
MCHC: 31.8 g/dL (ref 31.5–36.0)
MCV: 88.1 fL (ref 79.5–101.0)
MONO#: 0.2 10*3/uL (ref 0.1–0.9)
MONO%: 9.9 % (ref 0.0–14.0)
NEUT%: 56.8 % (ref 38.4–76.8)
NEUTROS ABS: 1 10*3/uL — AB (ref 1.5–6.5)
Platelets: 256 10*3/uL (ref 145–400)
RBC: 3.26 10*6/uL — AB (ref 3.70–5.45)
RDW: 19.1 % — AB (ref 11.2–14.5)
WBC: 1.8 10*3/uL — ABNORMAL LOW (ref 3.9–10.3)
lymph#: 0.5 10*3/uL — ABNORMAL LOW (ref 0.9–3.3)

## 2014-07-13 LAB — TECHNOLOGIST REVIEW

## 2014-07-13 MED ORDER — ONDANSETRON 8 MG/50ML IVPB (CHCC)
8.0000 mg | Freq: Once | INTRAVENOUS | Status: AC
  Administered 2014-07-13: 8 mg via INTRAVENOUS

## 2014-07-13 MED ORDER — SODIUM CHLORIDE 0.9 % IJ SOLN
10.0000 mL | INTRAMUSCULAR | Status: DC | PRN
  Administered 2014-07-13: 10 mL
  Filled 2014-07-13: qty 10

## 2014-07-13 MED ORDER — SODIUM CHLORIDE 0.9 % IV SOLN
Freq: Once | INTRAVENOUS | Status: AC
  Administered 2014-07-13: 12:00:00 via INTRAVENOUS

## 2014-07-13 MED ORDER — ALTEPLASE 2 MG IJ SOLR
2.0000 mg | Freq: Once | INTRAMUSCULAR | Status: AC | PRN
  Administered 2014-07-13: 2 mg
  Filled 2014-07-13: qty 2

## 2014-07-13 MED ORDER — PACLITAXEL PROTEIN-BOUND CHEMO INJECTION 100 MG
60.0000 mg/m2 | Freq: Once | INTRAVENOUS | Status: AC
  Administered 2014-07-13: 125 mg via INTRAVENOUS
  Filled 2014-07-13: qty 25

## 2014-07-13 MED ORDER — HEPARIN SOD (PORK) LOCK FLUSH 100 UNIT/ML IV SOLN
500.0000 [IU] | Freq: Once | INTRAVENOUS | Status: AC | PRN
  Administered 2014-07-13: 500 [IU]
  Filled 2014-07-13: qty 5

## 2014-07-13 MED ORDER — ONDANSETRON 8 MG/NS 50 ML IVPB
INTRAVENOUS | Status: AC
Start: 2014-07-13 — End: 2014-07-13
  Filled 2014-07-13: qty 8

## 2014-07-13 NOTE — Progress Notes (Signed)
Ok to treat per Dr. Lindi Adie.

## 2014-07-13 NOTE — Patient Instructions (Signed)
[facility] Discharge Instructions for Patients Receiving Chemotherapy  Today you received the following chemotherapy agents Abraxane  To help prevent nausea and vomiting after your treatment, we encourage you to take your nausea medication as prescribed.   If you develop nausea and vomiting that is not controlled by your nausea medication, call the clinic.   BELOW ARE SYMPTOMS THAT SHOULD BE REPORTED IMMEDIATELY:  *FEVER GREATER THAN 100.5 F  *CHILLS WITH OR WITHOUT FEVER  NAUSEA AND VOMITING THAT IS NOT CONTROLLED WITH YOUR NAUSEA MEDICATION  *UNUSUAL SHORTNESS OF BREATH  *UNUSUAL BRUISING OR BLEEDING  TENDERNESS IN MOUTH AND THROAT WITH OR WITHOUT PRESENCE OF ULCERS  *URINARY PROBLEMS  *BOWEL PROBLEMS  UNUSUAL RASH Items with * indicate a potential emergency and should be followed up as soon as possible.  Feel free to call the clinic you have any questions or concerns. The clinic phone number is [phone].    

## 2014-07-14 DIAGNOSIS — D630 Anemia in neoplastic disease: Secondary | ICD-10-CM | POA: Diagnosis not present

## 2014-07-14 DIAGNOSIS — C50411 Malignant neoplasm of upper-outer quadrant of right female breast: Secondary | ICD-10-CM | POA: Diagnosis not present

## 2014-07-14 DIAGNOSIS — I10 Essential (primary) hypertension: Secondary | ICD-10-CM | POA: Diagnosis not present

## 2014-07-14 DIAGNOSIS — A047 Enterocolitis due to Clostridium difficile: Secondary | ICD-10-CM | POA: Diagnosis not present

## 2014-07-14 DIAGNOSIS — M6281 Muscle weakness (generalized): Secondary | ICD-10-CM | POA: Diagnosis not present

## 2014-07-14 DIAGNOSIS — F329 Major depressive disorder, single episode, unspecified: Secondary | ICD-10-CM | POA: Diagnosis not present

## 2014-07-16 DIAGNOSIS — C50411 Malignant neoplasm of upper-outer quadrant of right female breast: Secondary | ICD-10-CM | POA: Diagnosis not present

## 2014-07-16 DIAGNOSIS — I10 Essential (primary) hypertension: Secondary | ICD-10-CM | POA: Diagnosis not present

## 2014-07-16 DIAGNOSIS — F329 Major depressive disorder, single episode, unspecified: Secondary | ICD-10-CM | POA: Diagnosis not present

## 2014-07-16 DIAGNOSIS — A047 Enterocolitis due to Clostridium difficile: Secondary | ICD-10-CM | POA: Diagnosis not present

## 2014-07-16 DIAGNOSIS — M6281 Muscle weakness (generalized): Secondary | ICD-10-CM | POA: Diagnosis not present

## 2014-07-16 DIAGNOSIS — D630 Anemia in neoplastic disease: Secondary | ICD-10-CM | POA: Diagnosis not present

## 2014-07-20 ENCOUNTER — Encounter: Payer: Self-pay | Admitting: Nurse Practitioner

## 2014-07-20 ENCOUNTER — Ambulatory Visit (HOSPITAL_BASED_OUTPATIENT_CLINIC_OR_DEPARTMENT_OTHER): Payer: Medicare Other | Admitting: Nurse Practitioner

## 2014-07-20 ENCOUNTER — Ambulatory Visit: Payer: Medicare Other

## 2014-07-20 ENCOUNTER — Ambulatory Visit (HOSPITAL_BASED_OUTPATIENT_CLINIC_OR_DEPARTMENT_OTHER): Payer: Medicare Other

## 2014-07-20 ENCOUNTER — Telehealth: Payer: Self-pay | Admitting: Hematology and Oncology

## 2014-07-20 ENCOUNTER — Telehealth: Payer: Self-pay | Admitting: *Deleted

## 2014-07-20 ENCOUNTER — Other Ambulatory Visit (HOSPITAL_BASED_OUTPATIENT_CLINIC_OR_DEPARTMENT_OTHER): Payer: Medicare Other

## 2014-07-20 VITALS — BP 135/64 | HR 94 | Temp 97.4°F | Resp 18 | Ht 63.0 in | Wt 176.7 lb

## 2014-07-20 DIAGNOSIS — Z171 Estrogen receptor negative status [ER-]: Secondary | ICD-10-CM

## 2014-07-20 DIAGNOSIS — C50411 Malignant neoplasm of upper-outer quadrant of right female breast: Secondary | ICD-10-CM

## 2014-07-20 DIAGNOSIS — Z5111 Encounter for antineoplastic chemotherapy: Secondary | ICD-10-CM

## 2014-07-20 DIAGNOSIS — E876 Hypokalemia: Secondary | ICD-10-CM

## 2014-07-20 LAB — CBC WITH DIFFERENTIAL/PLATELET
BASO%: 0.8 % (ref 0.0–2.0)
Basophils Absolute: 0 10*3/uL (ref 0.0–0.1)
EOS ABS: 0.1 10*3/uL (ref 0.0–0.5)
EOS%: 2.3 % (ref 0.0–7.0)
HCT: 29.3 % — ABNORMAL LOW (ref 34.8–46.6)
HGB: 9.5 g/dL — ABNORMAL LOW (ref 11.6–15.9)
LYMPH%: 28.7 % (ref 14.0–49.7)
MCH: 28.4 pg (ref 25.1–34.0)
MCHC: 32.4 g/dL (ref 31.5–36.0)
MCV: 87.5 fL (ref 79.5–101.0)
MONO#: 0.4 10*3/uL (ref 0.1–0.9)
MONO%: 14 % (ref 0.0–14.0)
NEUT%: 54.2 % (ref 38.4–76.8)
NEUTROS ABS: 1.4 10*3/uL — AB (ref 1.5–6.5)
Platelets: 220 10*3/uL (ref 145–400)
RBC: 3.35 10*6/uL — ABNORMAL LOW (ref 3.70–5.45)
RDW: 17.4 % — AB (ref 11.2–14.5)
WBC: 2.7 10*3/uL — ABNORMAL LOW (ref 3.9–10.3)
lymph#: 0.8 10*3/uL — ABNORMAL LOW (ref 0.9–3.3)

## 2014-07-20 LAB — COMPREHENSIVE METABOLIC PANEL (CC13)
ALBUMIN: 3.1 g/dL — AB (ref 3.5–5.0)
ALT: 10 U/L (ref 0–55)
AST: 25 U/L (ref 5–34)
Alkaline Phosphatase: 83 U/L (ref 40–150)
Anion Gap: 11 mEq/L (ref 3–11)
BUN: 6.8 mg/dL — ABNORMAL LOW (ref 7.0–26.0)
CALCIUM: 9.3 mg/dL (ref 8.4–10.4)
CO2: 26 mEq/L (ref 22–29)
Chloride: 106 mEq/L (ref 98–109)
Creatinine: 1 mg/dL (ref 0.6–1.1)
EGFR: 60 mL/min/{1.73_m2} — ABNORMAL LOW (ref >90)
Glucose: 116 mg/dl (ref 70–140)
Potassium: 3 mEq/L — CL (ref 3.5–5.1)
Sodium: 143 mEq/L (ref 136–145)
TOTAL PROTEIN: 6 g/dL — AB (ref 6.4–8.3)
Total Bilirubin: 0.54 mg/dL (ref 0.20–1.20)

## 2014-07-20 LAB — TECHNOLOGIST REVIEW

## 2014-07-20 MED ORDER — SODIUM CHLORIDE 0.9 % IJ SOLN
10.0000 mL | INTRAMUSCULAR | Status: DC | PRN
  Administered 2014-07-20: 10 mL
  Filled 2014-07-20: qty 10

## 2014-07-20 MED ORDER — ONDANSETRON 8 MG/50ML IVPB (CHCC)
8.0000 mg | Freq: Once | INTRAVENOUS | Status: AC
  Administered 2014-07-20: 8 mg via INTRAVENOUS
  Filled 2014-07-20: qty 8

## 2014-07-20 MED ORDER — PACLITAXEL PROTEIN-BOUND CHEMO INJECTION 100 MG
60.0000 mg/m2 | Freq: Once | INTRAVENOUS | Status: AC
  Administered 2014-07-20: 125 mg via INTRAVENOUS
  Filled 2014-07-20: qty 25

## 2014-07-20 MED ORDER — SODIUM CHLORIDE 0.9 % IV SOLN
Freq: Once | INTRAVENOUS | Status: AC
  Administered 2014-07-20: 13:00:00 via INTRAVENOUS

## 2014-07-20 MED ORDER — POTASSIUM CHLORIDE CRYS ER 20 MEQ PO TBCR
20.0000 meq | EXTENDED_RELEASE_TABLET | Freq: Two times a day (BID) | ORAL | Status: DC
  Administered 2014-07-20: 20 meq via ORAL
  Filled 2014-07-20: qty 1

## 2014-07-20 MED ORDER — HEPARIN SOD (PORK) LOCK FLUSH 100 UNIT/ML IV SOLN
500.0000 [IU] | Freq: Once | INTRAVENOUS | Status: AC | PRN
  Administered 2014-07-20: 500 [IU]
  Filled 2014-07-20: qty 5

## 2014-07-20 NOTE — Progress Notes (Signed)
Patient Care Team: Hulan Fess, MD as PCP - General (Family Medicine)  DIAGNOSIS: Breast cancer of upper-outer quadrant of right female breast   Staging form: Breast, AJCC 7th Edition     Clinical: Stage IA (T1c, N0, cM0) - Signed by Thea Silversmith, MD on 02/18/2014     Pathologic: Stage IIA (T2, N0, cM0) - Signed by Rulon Eisenmenger, MD on 03/12/2014   SUMMARY OF ONCOLOGIC HISTORY:   Breast cancer of upper-outer quadrant of right female breast   02/27/2014 Surgery Right breast lumpectomy: Invasive ductal carcinoma grade 3, 2.5 cm, DCIS intermediate grade, margins negative, 2 SLN negative, ER/PR HER-2 negative   03/30/2014 -  Chemotherapy Adjuvant chemotherapy with dose dense Adriamycin Cytoxan x4 followed by Abraxane weekly x12   05/17/2014 - 05/26/2014 Hospital Admission Neutropenic fever secondary to dental infection which led to C. difficile enterocolitis, atypical pneumonia and SIRS    CHIEF COMPLIANT: Cycle 5 weekly Abraxane  INTERVAL HISTORY: Cheyenne Riggs is a 69 year old lady with above-mentioned history of right breast cancer triple negative disease who is currently on adjuvant chemotherapy. Today she is due for week 5 of abraxane. So far she is tolerating these treatments well. Her diarrhea is now completely resolved. She has had some nausea managed well with her PRN antiemetics. Her appetite is decreased. She is fatigued, but is able to push through it to complete tasks. She is short of breast with exertion only. This week her sinuses are bothering her. She is taking benadryl with some relief.   REVIEW OF SYSTEMS:    All other systems were reviewed with the patient and are negative.  I have reviewed the past medical history, past surgical history, social history and family history with the patient and they are unchanged from previous note.  ALLERGIES:  is allergic to ciprofloxacin; demerol; lidocaine; other; septra; codeine; and novocain.  MEDICATIONS:  Current Outpatient  Prescriptions  Medication Sig Dispense Refill  . cholecalciferol (VITAMIN D) 1000 UNITS tablet Take 1,000 Units by mouth daily.     Marland Kitchen FLUoxetine (PROZAC) 10 MG capsule Take 1 capsule (10 mg total) by mouth daily. 30 capsule 1  . lidocaine-prilocaine (EMLA) cream Apply 1 application topically once a week. On Chemo Day (Monday).  0  . LORazepam (ATIVAN) 0.5 MG tablet Take 1 tablet (0.5 mg total) by mouth every 8 (eight) hours as needed. Nausea 30 tablet 0  . acetaminophen (TYLENOL) 325 MG tablet Take 325 mg by mouth every 6 (six) hours as needed for moderate pain (pain).     . Calcium Carbonate-Simethicone (MAALOX ADVANCED MAX ST) 1000-60 MG CHEW Chew 1 tablet by mouth every 6 (six) hours as needed (indigestion). Heart burn    . diphenhydrAMINE (BENADRYL) 25 MG tablet Take 25 mg by mouth daily as needed for allergies (allergies).     . furosemide (LASIX) 40 MG tablet Take 1 tablet (40 mg total) by mouth daily as needed for fluid (Take a lasix 40 mg and Kdur 20 meq if your weight goes up 5 lbs). (Patient not taking: Reported on 07/20/2014) 30 tablet 0  . loperamide (IMODIUM) 2 MG capsule Take 2 mg by mouth daily as needed for diarrhea or loose stools.    . ondansetron (ZOFRAN) 8 MG tablet Take 8 mg by mouth daily as needed for nausea (nausea).   1  . potassium chloride SA (K-DUR,KLOR-CON) 20 MEQ tablet Take 1 tablet (20 mEq total) by mouth daily as needed (Take a lasix 40 mg and Kdur 20 meq  if your weight goes up 5 lbs). (Patient not taking: Reported on 07/20/2014) 30 tablet 0  . Probiotic Product (PROBIOTIC DAILY PO) Take 1 tablet by mouth daily.     . ranitidine (ZANTAC) 150 MG tablet Take 150 mg by mouth 2 (two) times daily as needed for heartburn.      Current Facility-Administered Medications  Medication Dose Route Frequency Provider Last Rate Last Dose  . potassium chloride SA (K-DUR,KLOR-CON) CR tablet 20 mEq  20 mEq Oral BID Laurie Panda, NP       Facility-Administered Medications Ordered  in Other Visits  Medication Dose Route Frequency Provider Last Rate Last Dose  . heparin lock flush 100 unit/mL  500 Units Intracatheter Once PRN Nicholas Lose, MD      . PACLitaxel-protein bound (ABRAXANE) chemo infusion 125 mg  60 mg/m2 (Treatment Plan Actual) Intravenous Once Nicholas Lose, MD      . sodium chloride 0.9 % injection 10 mL  10 mL Intracatheter PRN Nicholas Lose, MD        PHYSICAL EXAMINATION: ECOG PERFORMANCE STATUS: 1 - Symptomatic but completely ambulatory  Filed Vitals:   07/20/14 1043  BP: 135/64  Pulse: 94  Temp: 97.4 F (36.3 C)  Resp: 18   Filed Weights   07/20/14 1043  Weight: 176 lb 11.2 oz (80.151 kg)    Skin: warm, dry  HEENT: sclerae anicteric, conjunctivae pink, oropharynx clear. No thrush or mucositis.  Lymph Nodes: No cervical or supraclavicular lymphadenopathy  Lungs: clear to auscultation bilaterally, no rales, wheezes, or rhonci  Heart: regular rate and rhythm  Abdomen: round, soft, non tender, positive bowel sounds  Musculoskeletal: No focal spinal tenderness, no peripheral edema  Neuro: non focal, well oriented, positive affect  Breasts: deferred  LABORATORY DATA:  I have reviewed the data as listed   Chemistry      Component Value Date/Time   NA 143 07/20/2014 1031   NA 142 07/01/2014 0432   K 3.0* 07/20/2014 1031   K 2.8* 07/01/2014 0432   CL 104 07/01/2014 0432   CO2 26 07/20/2014 1031   CO2 30 07/01/2014 0432   BUN 6.8* 07/20/2014 1031   BUN 13 07/01/2014 0432   CREATININE 1.0 07/20/2014 1031   CREATININE 1.07 07/01/2014 0432      Component Value Date/Time   CALCIUM 9.3 07/20/2014 1031   CALCIUM 8.5 07/01/2014 0432   ALKPHOS 83 07/20/2014 1031   ALKPHOS 66 06/29/2014 1740   AST 25 07/20/2014 1031   AST 31 06/29/2014 1740   ALT 10 07/20/2014 1031   ALT 13 06/29/2014 1740   BILITOT 0.54 07/20/2014 1031   BILITOT 0.6 06/29/2014 1740       Lab Results  Component Value Date   WBC 2.7* 07/20/2014   HGB 9.5*  07/20/2014   HCT 29.3* 07/20/2014   MCV 87.5 07/20/2014   PLT 220 07/20/2014   NEUTROABS 1.4* 07/20/2014   ASSESSMENT & PLAN:  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 Patient started systemic adjuvant chemotherapy with dose dense Adriamycin Cytoxan. She started chemotherapy in 03/30/2014. Dose of chemotherapy was reduced after cycle 1 for severe neutropenia.   Current treatment: Cycle 5/12 of Abraxane (cycle 1 delayed due to hospitalization with dental infection, neutropenic fever and C. difficile colitis.)   The labs were reviewed in detail and her potassium  is down to 3.0 today. She stopped taking her supplements, because she was no longer taking the lasix. She was told to take the lasix for fluid volume gain of at least 5lb and for the past week she has been losing weight. She will get 20 meq today in the treatment room and she will take 20meq the next 2 days as well at home. The rest of the labs were relatively stable. Her ANC is up to 1.4 this week. She will proceed with cycle 5 of Abraxane as planned.   Emma-Lee will return next week for labs, a follow up visit, and cycle 6 of treatment.     No orders of the defined types were placed in this encounter.   The patient has a good understanding of the overall plan. she agrees with it. She will call with any problems that may develop before her next visit here.   Laurie Panda, NP

## 2014-07-20 NOTE — Patient Instructions (Signed)
[facility] Discharge Instructions for Patients Receiving Chemotherapy  Today you received the following chemotherapy agents Abraxane.  To help prevent nausea and vomiting after your treatment, we encourage you to take your nausea medication Zofran 8 mg every 8 hours as needed.  If you develop nausea and vomiting that is not controlled by your nausea medication, call the clinic.   BELOW ARE SYMPTOMS THAT SHOULD BE REPORTED IMMEDIATELY:  *FEVER GREATER THAN 100.5 F  *CHILLS WITH OR WITHOUT FEVER  NAUSEA AND VOMITING THAT IS NOT CONTROLLED WITH YOUR NAUSEA MEDICATION  *UNUSUAL SHORTNESS OF BREATH  *UNUSUAL BRUISING OR BLEEDING  TENDERNESS IN MOUTH AND THROAT WITH OR WITHOUT PRESENCE OF ULCERS  *URINARY PROBLEMS  *BOWEL PROBLEMS  UNUSUAL RASH Items with * indicate a potential emergency and should be followed up as soon as possible.  Feel free to call the clinic you have any questions or concerns. The clinic phone number is [phone].    

## 2014-07-20 NOTE — Telephone Encounter (Signed)
Per staff message and POF I have scheduled appts. Advised scheduler of appts. JMW  

## 2014-07-20 NOTE — Progress Notes (Signed)
Reviewed labs with Susanne Borders, NP, OKay to treat with WBC 2.7 ANC 1.4

## 2014-07-20 NOTE — Telephone Encounter (Signed)
appts made and avs printed for pt,email to michelle to adjust chemo as needed   anne

## 2014-07-20 NOTE — Assessment & Plan Note (Signed)
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 Patient started systemic adjuvant chemotherapy with dose dense Adriamycin Cytoxan. She started chemotherapy in 03/30/2014. Dose of chemotherapy was reduced after cycle 1 for severe neutropenia.   Current treatment: Cycle 5/12 of Abraxane (cycle 1 delayed due to hospitalization with dental infection, neutropenic fever and C. difficile colitis.)   The labs were reviewed in detail and her potassium is down to 3.0 today. She stopped taking her supplements, because she was no longer taking the lasix. She was told to take the lasix for fluid volume gain of at least 5lb and for the past week she has been losing weight. She will get 20 meq today in the treatment room and she will take 35meq the next 2 days as well at home. The rest of the labs were relatively stable. Her ANC is up to 1.4 this week. She will proceed with cycle 5 of Abraxane as planned.   Cheyenne Riggs will return next week for labs, a follow up visit, and cycle 6 of treatment.

## 2014-07-22 DIAGNOSIS — D630 Anemia in neoplastic disease: Secondary | ICD-10-CM | POA: Diagnosis not present

## 2014-07-22 DIAGNOSIS — I10 Essential (primary) hypertension: Secondary | ICD-10-CM | POA: Diagnosis not present

## 2014-07-22 DIAGNOSIS — M6281 Muscle weakness (generalized): Secondary | ICD-10-CM | POA: Diagnosis not present

## 2014-07-22 DIAGNOSIS — F329 Major depressive disorder, single episode, unspecified: Secondary | ICD-10-CM | POA: Diagnosis not present

## 2014-07-22 DIAGNOSIS — C50411 Malignant neoplasm of upper-outer quadrant of right female breast: Secondary | ICD-10-CM | POA: Diagnosis not present

## 2014-07-22 DIAGNOSIS — A047 Enterocolitis due to Clostridium difficile: Secondary | ICD-10-CM | POA: Diagnosis not present

## 2014-07-24 ENCOUNTER — Telehealth: Payer: Self-pay | Admitting: *Deleted

## 2014-07-24 DIAGNOSIS — F329 Major depressive disorder, single episode, unspecified: Secondary | ICD-10-CM | POA: Diagnosis not present

## 2014-07-24 DIAGNOSIS — C50411 Malignant neoplasm of upper-outer quadrant of right female breast: Secondary | ICD-10-CM | POA: Diagnosis not present

## 2014-07-24 DIAGNOSIS — I10 Essential (primary) hypertension: Secondary | ICD-10-CM | POA: Diagnosis not present

## 2014-07-24 DIAGNOSIS — M6281 Muscle weakness (generalized): Secondary | ICD-10-CM | POA: Diagnosis not present

## 2014-07-24 DIAGNOSIS — A047 Enterocolitis due to Clostridium difficile: Secondary | ICD-10-CM | POA: Diagnosis not present

## 2014-07-24 DIAGNOSIS — D630 Anemia in neoplastic disease: Secondary | ICD-10-CM | POA: Diagnosis not present

## 2014-07-24 NOTE — Telephone Encounter (Signed)
[facility] called to say that they had planned to discharge patient from PT this week, but have decided she could benefit from more PT.  They will sent forms next week to recert the.  Any questions in the meantime call [phone].

## 2014-07-27 ENCOUNTER — Other Ambulatory Visit (HOSPITAL_BASED_OUTPATIENT_CLINIC_OR_DEPARTMENT_OTHER): Payer: Medicare Other

## 2014-07-27 ENCOUNTER — Other Ambulatory Visit: Payer: Self-pay | Admitting: *Deleted

## 2014-07-27 ENCOUNTER — Ambulatory Visit (HOSPITAL_BASED_OUTPATIENT_CLINIC_OR_DEPARTMENT_OTHER): Payer: Medicare Other | Admitting: Hematology and Oncology

## 2014-07-27 ENCOUNTER — Ambulatory Visit (HOSPITAL_BASED_OUTPATIENT_CLINIC_OR_DEPARTMENT_OTHER): Payer: Medicare Other

## 2014-07-27 VITALS — BP 137/68 | HR 91 | Temp 97.6°F | Resp 20 | Ht 63.0 in | Wt 177.6 lb

## 2014-07-27 DIAGNOSIS — C50411 Malignant neoplasm of upper-outer quadrant of right female breast: Secondary | ICD-10-CM

## 2014-07-27 DIAGNOSIS — D701 Agranulocytosis secondary to cancer chemotherapy: Secondary | ICD-10-CM

## 2014-07-27 DIAGNOSIS — Z5111 Encounter for antineoplastic chemotherapy: Secondary | ICD-10-CM

## 2014-07-27 DIAGNOSIS — D6481 Anemia due to antineoplastic chemotherapy: Secondary | ICD-10-CM | POA: Diagnosis not present

## 2014-07-27 DIAGNOSIS — E876 Hypokalemia: Secondary | ICD-10-CM

## 2014-07-27 LAB — COMPREHENSIVE METABOLIC PANEL (CC13)
ALT: 12 U/L (ref 0–55)
AST: 18 U/L (ref 5–34)
Albumin: 3.1 g/dL — ABNORMAL LOW (ref 3.5–5.0)
Alkaline Phosphatase: 76 U/L (ref 40–150)
Anion Gap: 11 mEq/L (ref 3–11)
BUN: 7.2 mg/dL (ref 7.0–26.0)
CO2: 24 mEq/L (ref 22–29)
Calcium: 9 mg/dL (ref 8.4–10.4)
Chloride: 105 mEq/L (ref 98–109)
Creatinine: 1 mg/dL (ref 0.6–1.1)
EGFR: 59 mL/min/{1.73_m2} — ABNORMAL LOW (ref >90)
Glucose: 118 mg/dl (ref 70–140)
Potassium: 3.2 mEq/L — ABNORMAL LOW (ref 3.5–5.1)
SODIUM: 140 meq/L (ref 136–145)
Total Bilirubin: 0.5 mg/dL (ref 0.20–1.20)
Total Protein: 6 g/dL — ABNORMAL LOW (ref 6.4–8.3)

## 2014-07-27 LAB — CBC WITH DIFFERENTIAL/PLATELET
BASO%: 0.6 % (ref 0.0–2.0)
Basophils Absolute: 0 10*3/uL (ref 0.0–0.1)
EOS ABS: 0.1 10*3/uL (ref 0.0–0.5)
EOS%: 1.6 % (ref 0.0–7.0)
HCT: 28.4 % — ABNORMAL LOW (ref 34.8–46.6)
HGB: 9.1 g/dL — ABNORMAL LOW (ref 11.6–15.9)
LYMPH%: 20.2 % (ref 14.0–49.7)
MCH: 28.1 pg (ref 25.1–34.0)
MCHC: 32 g/dL (ref 31.5–36.0)
MCV: 87.7 fL (ref 79.5–101.0)
MONO#: 0.3 10*3/uL (ref 0.1–0.9)
MONO%: 8.1 % (ref 0.0–14.0)
NEUT#: 2.2 10*3/uL (ref 1.5–6.5)
NEUT%: 69.5 % (ref 38.4–76.8)
Platelets: 208 10*3/uL (ref 145–400)
RBC: 3.24 10*6/uL — AB (ref 3.70–5.45)
RDW: 17.2 % — AB (ref 11.2–14.5)
WBC: 3.2 10*3/uL — AB (ref 3.9–10.3)
lymph#: 0.7 10*3/uL — ABNORMAL LOW (ref 0.9–3.3)

## 2014-07-27 MED ORDER — PACLITAXEL PROTEIN-BOUND CHEMO INJECTION 100 MG
60.0000 mg/m2 | Freq: Once | INTRAVENOUS | Status: AC
  Administered 2014-07-27: 125 mg via INTRAVENOUS
  Filled 2014-07-27: qty 25

## 2014-07-27 MED ORDER — LORAZEPAM 0.5 MG PO TABS: 0.5000 mg | ORAL_TABLET | Freq: Three times a day (TID) | ORAL | Status: DC | PRN

## 2014-07-27 MED ORDER — SODIUM CHLORIDE 0.9 % IJ SOLN
10.0000 mL | INTRAMUSCULAR | Status: DC | PRN
  Administered 2014-07-27: 10 mL
  Filled 2014-07-27: qty 10

## 2014-07-27 MED ORDER — PALONOSETRON HCL INJECTION 0.25 MG/5ML
INTRAVENOUS | Status: AC
  Filled 2014-07-27: qty 5

## 2014-07-27 MED ORDER — POTASSIUM CHLORIDE ER 10 MEQ PO CPCR: 20.0000 meq | ORAL_CAPSULE | Freq: Every day | ORAL | Status: DC

## 2014-07-27 MED ORDER — HEPARIN SOD (PORK) LOCK FLUSH 100 UNIT/ML IV SOLN
500.0000 [IU] | Freq: Once | INTRAVENOUS | Status: AC | PRN
  Administered 2014-07-27: 500 [IU]
  Filled 2014-07-27: qty 5

## 2014-07-27 MED ORDER — PALONOSETRON HCL INJECTION 0.25 MG/5ML
0.2500 mg | Freq: Once | INTRAVENOUS | Status: AC
  Administered 2014-07-27: 0.25 mg via INTRAVENOUS

## 2014-07-27 MED ORDER — SODIUM CHLORIDE 0.9 % IV SOLN
Freq: Once | INTRAVENOUS | Status: AC
  Administered 2014-07-27: 09:00:00 via INTRAVENOUS

## 2014-07-27 NOTE — Progress Notes (Signed)
Patient got a "little woozy" on way to lab. Vs taken and patient given a drink and transported to lab via w/c. Patient states that she is feeling better. Patient did drive herself but stated that she can get someone to pick her up if needed. Dr. Lindi Adie notified of this.

## 2014-07-27 NOTE — Progress Notes (Signed)
Pt complains of laragitis, head congestion, and rash to left arm.  States rash has been on her arm about 2 weeks and seems to be getting worse. Dr Lindi Adie notified of pt concerns.

## 2014-07-27 NOTE — Patient Instructions (Signed)
[facility] Discharge Instructions for Patients Receiving Chemotherapy  Today you received the following chemotherapy agents Abraxne  To help prevent nausea and vomiting after your treatment, we encourage you to take your nausea medication as directed/prescribed   If you develop nausea and vomiting that is not controlled by your nausea medication, call the clinic.   BELOW ARE SYMPTOMS THAT SHOULD BE REPORTED IMMEDIATELY:  *FEVER GREATER THAN 100.5 F  *CHILLS WITH OR WITHOUT FEVER  NAUSEA AND VOMITING THAT IS NOT CONTROLLED WITH YOUR NAUSEA MEDICATION  *UNUSUAL SHORTNESS OF BREATH  *UNUSUAL BRUISING OR BLEEDING  TENDERNESS IN MOUTH AND THROAT WITH OR WITHOUT PRESENCE OF ULCERS  *URINARY PROBLEMS  *BOWEL PROBLEMS  UNUSUAL RASH Items with * indicate a potential emergency and should be followed up as soon as possible.  Feel free to call the clinic you have any questions or concerns. The clinic phone number is [phone].    

## 2014-07-27 NOTE — Progress Notes (Signed)
Patient Care Team: Hulan Fess, MD as PCP - General (Family Medicine)  DIAGNOSIS: Breast cancer of upper-outer quadrant of right female breast   Staging form: Breast, AJCC 7th Edition     Clinical: Stage IA (T1c, N0, cM0) - Signed by Thea Silversmith, MD on 02/18/2014     Pathologic: Stage IIA (T2, N0, cM0) - Signed by Rulon Eisenmenger, MD on 03/12/2014   SUMMARY OF ONCOLOGIC HISTORY:   Breast cancer of upper-outer quadrant of right female breast   02/27/2014 Surgery Right breast lumpectomy: Invasive ductal carcinoma grade 3, 2.5 cm, DCIS intermediate grade, margins negative, 2 SLN negative, ER/PR HER-2 negative   03/30/2014 -  Chemotherapy Adjuvant chemotherapy with dose dense Adriamycin Cytoxan x4 followed by Abraxane weekly x12   05/17/2014 - 05/26/2014 Hospital Admission Neutropenic fever secondary to dental infection which led to C. difficile enterocolitis, atypical pneumonia and SIRS    CHIEF COMPLIANT: Rash on arms left greater than right, decreased appetite and nausea  INTERVAL HISTORY: Cheyenne Riggs is a 69 year old lady with above-mentioned history of right-sided breast cancer who is currently on adjuvant chemotherapy. Today is week 6 of Abraxane. She reports that nausea has been very bad with this treatment. When she takes Ativan she feels better. As she was walking towards chemoinfusion, she felt lightheaded and dizzy. Recheck of her blood pressure and vitals were stable. Denies any tingling or numbness of her hands or feet. Nails are most likely going to fall because they seem discolored.  REVIEW OF SYSTEMS:   Constitutional: Felt dizzy when she was walking towards chemotherapy Eyes: Denies blurriness of vision Ears, nose, mouth, throat, and face: Denies mucositis or sore throat Respiratory: Denies cough, dyspnea or wheezes Cardiovascular: Denies palpitation, chest discomfort or lower extremity swelling Gastrointestinal: Complains of nausea, diarrhea for 2 days Skin: Denies abnormal  skin rashes Lymphatics: Denies new lymphadenopathy or easy bruising Neurological:Denies numbness, tingling or new weaknesses Behavioral/Psych: Mood is stable, no new changes  Breast:  denies any pain or lumps or nodules in either breasts All other systems were reviewed with the patient and are negative.  I have reviewed the past medical history, past surgical history, social history and family history with the patient and they are unchanged from previous note.  ALLERGIES:  is allergic to ciprofloxacin; demerol; lidocaine; other; septra; codeine; and novocain.  MEDICATIONS:  Current Outpatient Prescriptions  Medication Sig Dispense Refill  . acetaminophen (TYLENOL) 325 MG tablet Take 325 mg by mouth every 6 (six) hours as needed for moderate pain (pain).     . Calcium Carbonate-Simethicone (MAALOX ADVANCED MAX ST) 1000-60 MG CHEW Chew 1 tablet by mouth every 6 (six) hours as needed (indigestion). Heart burn    . cholecalciferol (VITAMIN D) 1000 UNITS tablet Take 1,000 Units by mouth daily.     . diphenhydrAMINE (BENADRYL) 25 MG tablet Take 25 mg by mouth daily as needed for allergies (allergies).     . furosemide (LASIX) 40 MG tablet Take 1 tablet (40 mg total) by mouth daily as needed for fluid (Take a lasix 40 mg and Kdur 20 meq if your weight goes up 5 lbs). 30 tablet 0  . lidocaine-prilocaine (EMLA) cream Apply 1 application topically once a week. On Chemo Day (Monday).  0  . loperamide (IMODIUM) 2 MG capsule Take 2 mg by mouth daily as needed for diarrhea or loose stools.    Marland Kitchen LORazepam (ATIVAN) 0.5 MG tablet Take 1 tablet (0.5 mg total) by mouth every 8 (eight) hours as  needed. Nausea 30 tablet 0  . ondansetron (ZOFRAN) 8 MG tablet Take 8 mg by mouth daily as needed for nausea (nausea).   1  . potassium chloride SA (K-DUR,KLOR-CON) 20 MEQ tablet Take 1 tablet (20 mEq total) by mouth daily as needed (Take a lasix 40 mg and Kdur 20 meq if your weight goes up 5 lbs). 30 tablet 0  .  Probiotic Product (PROBIOTIC DAILY PO) Take 1 tablet by mouth daily.     . ranitidine (ZANTAC) 150 MG tablet Take 150 mg by mouth 2 (two) times daily as needed for heartburn.     Marland Kitchen FLUoxetine (PROZAC) 10 MG capsule Take 1 capsule (10 mg total) by mouth daily. 30 capsule 1   No current facility-administered medications for this visit.    PHYSICAL EXAMINATION: ECOG PERFORMANCE STATUS: 1 - Symptomatic but completely ambulatory  Filed Vitals:   07/27/14 0845  BP: 137/68  Pulse: 91  Temp:   Resp: 20   Filed Weights   07/27/14 0810  Weight: 177 lb 9.6 oz (80.559 kg)    GENERAL:alert, no distress and comfortable SKIN: skin color, texture, turgor are normal, no rashes or significant lesions EYES: normal, Conjunctiva are pink and non-injected, sclera clear OROPHARYNX:no exudate, no erythema and lips, buccal mucosa, and tongue normal  NECK: supple, thyroid normal size, non-tender, without nodularity LYMPH:  no palpable lymphadenopathy in the cervical, axillary or inguinal LUNGS: clear to auscultation and percussion with normal breathing effort HEART: regular rate & rhythm and no murmurs and no lower extremity edema ABDOMEN:abdomen soft, non-tender and normal bowel sounds Musculoskeletal:no cyanosis of digits and no clubbing  NEURO: alert & oriented x 3 with fluent speech, no focal motor/sensory deficits  LABORATORY DATA:  I have reviewed the data as listed   Chemistry      Component Value Date/Time   NA 143 07/20/2014 1031   NA 142 07/01/2014 0432   K 3.0* 07/20/2014 1031   K 2.8* 07/01/2014 0432   CL 104 07/01/2014 0432   CO2 26 07/20/2014 1031   CO2 30 07/01/2014 0432   BUN 6.8* 07/20/2014 1031   BUN 13 07/01/2014 0432   CREATININE 1.0 07/20/2014 1031   CREATININE 1.07 07/01/2014 0432      Component Value Date/Time   CALCIUM 9.3 07/20/2014 1031   CALCIUM 8.5 07/01/2014 0432   ALKPHOS 83 07/20/2014 1031   ALKPHOS 66 06/29/2014 1740   AST 25 07/20/2014 1031   AST 31  06/29/2014 1740   ALT 10 07/20/2014 1031   ALT 13 06/29/2014 1740   BILITOT 0.54 07/20/2014 1031   BILITOT 0.6 06/29/2014 1740       Lab Results  Component Value Date   WBC 2.7* 07/20/2014   HGB 9.5* 07/20/2014   HCT 29.3* 07/20/2014   MCV 87.5 07/20/2014   PLT 220 07/20/2014   NEUTROABS 1.4* 07/20/2014   ASSESSMENT & PLAN:  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 Patient started systemic adjuvant chemotherapy with dose dense Adriamycin Cytoxan followed by Abraxane on 03/30/2014.   Current treatment: Cycle 6/12 of Abraxane (cycle 1 delayed due to hospitalization with dental infection, neutropenic fever and C. difficile colitis.)   Chemotoxicities: 1. Neutropenia grade 4: Required dose reduction for cycle 2 of chemotherapy and for cycle 4 AC, hospitalization for neutropenic fever after fourth cycle of AC, dose  reduction after cycle 1 of Abraxane. 2. Alopecia 3. Chemotherapy-induced nausea : Resolved 4. Chemotherapy-induced diarrhea: Patient went on to get C. difficile enterocolitis and was treated with Flagyl. 5. Chemotherapy-induced severe fatigue: Much improved with treatment break 6. Chemotherapy-induced anemia: Hemoglobin being monitored closely today it is 9.3 7. Hospitalization for neutropenic fever, dental infection with C. difficile colitis: After 4 cycles of Adriamycin and Cytoxan. She is currently off Flagyl and diarrhea has resolved. Currently takes probiotics 8. Hypokalemia: Patient might need oral potassium supplementation long-term. I attribute her hypokalemia to diarrhea.  9. Hospitalization for shortness of breath 07/01/2014 diagnosed with CHF treated with Lasix but EF was normal. Patient seeing a cardiologist next week. 10 Rash on her arms left greater than right colon related to Abraxane fixed drug  eruption. I recommended that she apply 1% hydrocortisone ointment twice a day.   Tobacco cessation: Patient quit smoking since chemotherapy .  Return to clinic in 2 weeks for follow-up and weekly for chemotherapy    No orders of the defined types were placed in this encounter.   The patient has a good understanding of the overall plan. she agrees with it. She will call with any problems that may develop before her next visit here.   Rulon Eisenmenger, MD

## 2014-07-27 NOTE — Assessment & Plan Note (Signed)
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 Patient started systemic adjuvant chemotherapy with dose dense Adriamycin Cytoxan followed by Abraxane. on 03/30/2014.   Current treatment: Cycle 6/12 of Abraxane (cycle 1 delayed due to hospitalization with dental infection, neutropenic fever and C. difficile colitis.)   Chemotoxicities: 1. Neutropenia grade 4: Required dose reduction for cycle 2 of chemotherapy and for cycle 4 AC, hospitalization for neutropenic fever after fourth cycle of AC, dose reduction after cycle 1 of Abraxane. 2. Alopecia 3. Chemotherapy-induced nausea : Resolved 4. Chemotherapy-induced diarrhea: Patient went on to get C. difficile enterocolitis and was treated with Flagyl. 5. Chemotherapy-induced severe fatigue: Much improved with treatment break 6. Chemotherapy-induced anemia: Hemoglobin being monitored closely today it is 9.3 7. Hospitalization for neutropenic fever, dental infection with C. difficile colitis: After 4 cycles of Adriamycin and Cytoxan. She is currently off Flagyl and diarrhea has resolved. Currently takes probiotics 8. Hypokalemia: Attributable to prior diarrhea. She was on 80 mEq of potassium might decrease it to 40 mEq. She has been off potassium for the last 4 days. We are awaiting today's potassium result. Since the patient does not have any further diarrhea, I expect the potassium to remain stable  9. Hospitalization for shortness of breath 07/01/2014 diagnosed with CHF treated with Lasix but EF was normal.  Tobacco cessation: Patient quit smoking since chemotherapy .  Return to clinic in 2 weeks for follow-up and weekly for chemotherapy

## 2014-07-27 NOTE — Progress Notes (Signed)
Pt's potassium low. Message given to Dr. Lindi Adie.  Script sent in for potassium.  Pt to take 23mEq po qd for one month.  Pt made aware to pick up script and to take for one month.

## 2014-07-28 DIAGNOSIS — A047 Enterocolitis due to Clostridium difficile: Secondary | ICD-10-CM | POA: Diagnosis not present

## 2014-07-28 DIAGNOSIS — C50411 Malignant neoplasm of upper-outer quadrant of right female breast: Secondary | ICD-10-CM | POA: Diagnosis not present

## 2014-07-28 DIAGNOSIS — F329 Major depressive disorder, single episode, unspecified: Secondary | ICD-10-CM | POA: Diagnosis not present

## 2014-07-28 DIAGNOSIS — M6281 Muscle weakness (generalized): Secondary | ICD-10-CM | POA: Diagnosis not present

## 2014-07-28 DIAGNOSIS — I10 Essential (primary) hypertension: Secondary | ICD-10-CM | POA: Diagnosis not present

## 2014-07-28 DIAGNOSIS — D630 Anemia in neoplastic disease: Secondary | ICD-10-CM | POA: Diagnosis not present

## 2014-07-30 ENCOUNTER — Ambulatory Visit (HOSPITAL_BASED_OUTPATIENT_CLINIC_OR_DEPARTMENT_OTHER): Payer: Medicare Other

## 2014-07-30 ENCOUNTER — Ambulatory Visit (HOSPITAL_BASED_OUTPATIENT_CLINIC_OR_DEPARTMENT_OTHER): Payer: Medicare Other | Admitting: Nurse Practitioner

## 2014-07-30 ENCOUNTER — Other Ambulatory Visit: Payer: Self-pay | Admitting: *Deleted

## 2014-07-30 ENCOUNTER — Ambulatory Visit: Payer: Medicare Other

## 2014-07-30 ENCOUNTER — Telehealth: Payer: Self-pay | Admitting: *Deleted

## 2014-07-30 VITALS — BP 119/60 | HR 82 | Temp 98.1°F | Resp 20 | Wt 175.1 lb

## 2014-07-30 DIAGNOSIS — C50411 Malignant neoplasm of upper-outer quadrant of right female breast: Secondary | ICD-10-CM

## 2014-07-30 DIAGNOSIS — L271 Localized skin eruption due to drugs and medicaments taken internally: Secondary | ICD-10-CM | POA: Diagnosis not present

## 2014-07-30 DIAGNOSIS — E860 Dehydration: Secondary | ICD-10-CM

## 2014-07-30 DIAGNOSIS — R530 Neoplastic (malignant) related fatigue: Secondary | ICD-10-CM | POA: Diagnosis not present

## 2014-07-30 DIAGNOSIS — R197 Diarrhea, unspecified: Secondary | ICD-10-CM | POA: Diagnosis not present

## 2014-07-30 DIAGNOSIS — L603 Nail dystrophy: Secondary | ICD-10-CM | POA: Diagnosis not present

## 2014-07-30 DIAGNOSIS — R21 Rash and other nonspecific skin eruption: Secondary | ICD-10-CM

## 2014-07-30 DIAGNOSIS — R630 Anorexia: Secondary | ICD-10-CM

## 2014-07-30 DIAGNOSIS — R112 Nausea with vomiting, unspecified: Secondary | ICD-10-CM | POA: Diagnosis not present

## 2014-07-30 DIAGNOSIS — D6481 Anemia due to antineoplastic chemotherapy: Secondary | ICD-10-CM

## 2014-07-30 DIAGNOSIS — E8809 Other disorders of plasma-protein metabolism, not elsewhere classified: Secondary | ICD-10-CM

## 2014-07-30 DIAGNOSIS — T451X5A Adverse effect of antineoplastic and immunosuppressive drugs, initial encounter: Secondary | ICD-10-CM

## 2014-07-30 DIAGNOSIS — E876 Hypokalemia: Secondary | ICD-10-CM

## 2014-07-30 LAB — CBC WITH DIFFERENTIAL/PLATELET
BASO%: 0.4 % (ref 0.0–2.0)
BASOS ABS: 0 10*3/uL (ref 0.0–0.1)
EOS%: 0.1 % (ref 0.0–7.0)
Eosinophils Absolute: 0 10*3/uL (ref 0.0–0.5)
HCT: 27.9 % — ABNORMAL LOW (ref 34.8–46.6)
HGB: 8.9 g/dL — ABNORMAL LOW (ref 11.6–15.9)
LYMPH%: 13.9 % — ABNORMAL LOW (ref 14.0–49.7)
MCH: 28.1 pg (ref 25.1–34.0)
MCHC: 32 g/dL (ref 31.5–36.0)
MCV: 87.8 fL (ref 79.5–101.0)
MONO#: 0.1 10*3/uL (ref 0.1–0.9)
MONO%: 3.6 % (ref 0.0–14.0)
NEUT%: 82 % — ABNORMAL HIGH (ref 38.4–76.8)
NEUTROS ABS: 2.4 10*3/uL (ref 1.5–6.5)
Platelets: 197 10*3/uL (ref 145–400)
RBC: 3.18 10*6/uL — AB (ref 3.70–5.45)
RDW: 18.5 % — AB (ref 11.2–14.5)
WBC: 2.9 10*3/uL — ABNORMAL LOW (ref 3.9–10.3)
lymph#: 0.4 10*3/uL — ABNORMAL LOW (ref 0.9–3.3)

## 2014-07-30 LAB — COMPREHENSIVE METABOLIC PANEL (CC13)
ALT: 11 U/L (ref 0–55)
AST: 18 U/L (ref 5–34)
Albumin: 3 g/dL — ABNORMAL LOW (ref 3.5–5.0)
Alkaline Phosphatase: 82 U/L (ref 40–150)
Anion Gap: 12 mEq/L — ABNORMAL HIGH (ref 3–11)
BUN: 8.6 mg/dL (ref 7.0–26.0)
CALCIUM: 8.6 mg/dL (ref 8.4–10.4)
CHLORIDE: 106 meq/L (ref 98–109)
CO2: 24 mEq/L (ref 22–29)
Creatinine: 0.9 mg/dL (ref 0.6–1.1)
EGFR: 70 mL/min/{1.73_m2} — ABNORMAL LOW (ref >90)
GLUCOSE: 114 mg/dL (ref 70–140)
Potassium: 3.1 mEq/L — ABNORMAL LOW (ref 3.5–5.1)
SODIUM: 142 meq/L (ref 136–145)
Total Bilirubin: 1.03 mg/dL (ref 0.20–1.20)
Total Protein: 5.8 g/dL — ABNORMAL LOW (ref 6.4–8.3)

## 2014-07-30 MED ORDER — SODIUM CHLORIDE 0.9 % IV SOLN
INTRAVENOUS | Status: AC
  Administered 2014-07-30: 13:00:00 via INTRAVENOUS

## 2014-07-30 MED ORDER — SODIUM CHLORIDE 0.9 % IV SOLN
Freq: Once | INTRAVENOUS | Status: AC
  Administered 2014-07-30: 13:00:00 via INTRAVENOUS
  Filled 2014-07-30: qty 4

## 2014-07-30 MED ORDER — POTASSIUM CHLORIDE CRYS ER 20 MEQ PO TBCR
40.0000 meq | EXTENDED_RELEASE_TABLET | Freq: Once | ORAL | Status: AC
  Administered 2014-07-30: 40 meq via ORAL
  Filled 2014-07-30: qty 2

## 2014-07-30 MED ORDER — HEPARIN SOD (PORK) LOCK FLUSH 100 UNIT/ML IV SOLN
500.0000 [IU] | Freq: Once | INTRAVENOUS | Status: AC
  Administered 2014-07-30: 500 [IU] via INTRAVENOUS
  Filled 2014-07-30: qty 5

## 2014-07-30 MED ORDER — SODIUM CHLORIDE 0.9 % IJ SOLN
10.0000 mL | INTRAMUSCULAR | Status: DC | PRN
  Administered 2014-07-30: 10 mL via INTRAVENOUS
  Filled 2014-07-30: qty 10

## 2014-07-30 NOTE — Telephone Encounter (Addendum)
PT. HAS NOT BEEN TAKING HER ZOFRAN AND TAKING THE ATIVAN IN THE MORNING AND AT BEDTIME. INSTRUCTED PT. TO TAKE AN ATIVAN NOW. IN THE PAST 24 HOURS PT. HAS HAD 50 OUNCES OF FLUID. SHE HAS EATEN VERY LITTLE THIS WEEK. PT. HAS HAD ONE DIARRHEA STOOL Monday, Tuesday, AND TODAY SHE HAD A LARGE DIARRHEA STOOL. PT. IS TAKING IMODIUM. REVIEWED INSTRUCTIONS WITH PT. NO FEVER. NOTIFIED PT TO COME TO THIS OFFICE AT 11:00AM FOR LAB AND SEE CINDEE BACON,NP WITH POSSIBLE IV FLUIDS. POF COMPLETED. THIS NOTE WAS ROUTED TO CINDEE BACON,NP AND ALEXIS ROBINSON,RN.

## 2014-07-30 NOTE — Patient Instructions (Signed)

## 2014-07-31 ENCOUNTER — Other Ambulatory Visit (HOSPITAL_COMMUNITY)
Admission: RE | Admit: 2014-07-31 | Discharge: 2014-07-31 | Disposition: A | Discharge status: Home / Self Care | Payer: Medicare Other | Source: Ambulatory Visit | Attending: Hematology and Oncology | Admitting: Hematology and Oncology

## 2014-07-31 ENCOUNTER — Encounter: Payer: Medicare Other | Admitting: Cardiovascular Disease

## 2014-07-31 ENCOUNTER — Telehealth: Payer: Self-pay | Admitting: Nurse Practitioner

## 2014-07-31 ENCOUNTER — Telehealth: Payer: Self-pay

## 2014-07-31 ENCOUNTER — Ambulatory Visit (HOSPITAL_BASED_OUTPATIENT_CLINIC_OR_DEPARTMENT_OTHER): Payer: Medicare Other | Admitting: Nurse Practitioner

## 2014-07-31 ENCOUNTER — Ambulatory Visit (HOSPITAL_BASED_OUTPATIENT_CLINIC_OR_DEPARTMENT_OTHER): Payer: Medicare Other

## 2014-07-31 ENCOUNTER — Other Ambulatory Visit: Payer: Self-pay

## 2014-07-31 VITALS — BP 141/65 | HR 80 | Temp 98.5°F | Resp 14

## 2014-07-31 DIAGNOSIS — E8809 Other disorders of plasma-protein metabolism, not elsewhere classified: Secondary | ICD-10-CM | POA: Insufficient documentation

## 2014-07-31 DIAGNOSIS — R197 Diarrhea, unspecified: Secondary | ICD-10-CM | POA: Diagnosis not present

## 2014-07-31 DIAGNOSIS — C50411 Malignant neoplasm of upper-outer quadrant of right female breast: Secondary | ICD-10-CM | POA: Diagnosis not present

## 2014-07-31 DIAGNOSIS — E860 Dehydration: Secondary | ICD-10-CM | POA: Insufficient documentation

## 2014-07-31 DIAGNOSIS — E876 Hypokalemia: Secondary | ICD-10-CM | POA: Insufficient documentation

## 2014-07-31 DIAGNOSIS — R630 Anorexia: Secondary | ICD-10-CM | POA: Insufficient documentation

## 2014-07-31 DIAGNOSIS — L271 Localized skin eruption due to drugs and medicaments taken internally: Secondary | ICD-10-CM | POA: Insufficient documentation

## 2014-07-31 DIAGNOSIS — R5383 Other fatigue: Secondary | ICD-10-CM | POA: Insufficient documentation

## 2014-07-31 DIAGNOSIS — R112 Nausea with vomiting, unspecified: Secondary | ICD-10-CM | POA: Insufficient documentation

## 2014-07-31 DIAGNOSIS — R21 Rash and other nonspecific skin eruption: Secondary | ICD-10-CM | POA: Insufficient documentation

## 2014-07-31 LAB — CLOSTRIDIUM DIFFICILE BY PCR: Toxigenic C. Difficile by PCR: POSITIVE — AB

## 2014-07-31 MED ORDER — SODIUM CHLORIDE 0.9 % IV SOLN
Freq: Once | INTRAVENOUS | Status: AC
  Administered 2014-07-31: 14:00:00 via INTRAVENOUS
  Filled 2014-07-31: qty 4

## 2014-07-31 MED ORDER — SODIUM CHLORIDE 0.9 % IV SOLN
Freq: Once | INTRAVENOUS | Status: AC
  Administered 2014-07-31: 14:00:00 via INTRAVENOUS

## 2014-07-31 NOTE — Patient Instructions (Signed)

## 2014-07-31 NOTE — Telephone Encounter (Signed)
Returning pt call from 1039 this am. Had 3 diarrhea stool this am, like loose pudding. Took 2 immodium after first stool but no more. Told her to go ahead and take another. Total of 8 per day maximum.  Has no control. Will try to get a ride to [facility]. Requested she get here before 3 if possible. POF for smc placed.

## 2014-07-31 NOTE — Telephone Encounter (Signed)
per pof to sch pt [facility]-pt aware

## 2014-08-01 ENCOUNTER — Emergency Department (HOSPITAL_COMMUNITY)
Admission: EM | Admit: 2014-08-01 | Discharge: 2014-08-01 | Disposition: A | Discharge status: Home / Self Care | Payer: Medicare Other | Attending: Emergency Medicine | Admitting: Emergency Medicine

## 2014-08-01 ENCOUNTER — Encounter: Payer: Self-pay | Admitting: Nurse Practitioner

## 2014-08-01 ENCOUNTER — Encounter (HOSPITAL_COMMUNITY): Payer: Self-pay

## 2014-08-01 DIAGNOSIS — E876 Hypokalemia: Secondary | ICD-10-CM | POA: Insufficient documentation

## 2014-08-01 DIAGNOSIS — R197 Diarrhea, unspecified: Secondary | ICD-10-CM | POA: Diagnosis present

## 2014-08-01 DIAGNOSIS — F329 Major depressive disorder, single episode, unspecified: Secondary | ICD-10-CM | POA: Insufficient documentation

## 2014-08-01 DIAGNOSIS — A047 Enterocolitis due to Clostridium difficile: Secondary | ICD-10-CM | POA: Diagnosis not present

## 2014-08-01 DIAGNOSIS — D6489 Other specified anemias: Secondary | ICD-10-CM

## 2014-08-01 DIAGNOSIS — Z87891 Personal history of nicotine dependence: Secondary | ICD-10-CM | POA: Diagnosis not present

## 2014-08-01 DIAGNOSIS — Z79899 Other long term (current) drug therapy: Secondary | ICD-10-CM | POA: Diagnosis not present

## 2014-08-01 DIAGNOSIS — A0472 Enterocolitis due to Clostridium difficile, not specified as recurrent: Secondary | ICD-10-CM

## 2014-08-01 DIAGNOSIS — Z87442 Personal history of urinary calculi: Secondary | ICD-10-CM | POA: Insufficient documentation

## 2014-08-01 DIAGNOSIS — Z972 Presence of dental prosthetic device (complete) (partial): Secondary | ICD-10-CM | POA: Insufficient documentation

## 2014-08-01 DIAGNOSIS — Z8719 Personal history of other diseases of the digestive system: Secondary | ICD-10-CM | POA: Insufficient documentation

## 2014-08-01 DIAGNOSIS — F419 Anxiety disorder, unspecified: Secondary | ICD-10-CM | POA: Diagnosis not present

## 2014-08-01 DIAGNOSIS — E780 Pure hypercholesterolemia: Secondary | ICD-10-CM | POA: Insufficient documentation

## 2014-08-01 DIAGNOSIS — D649 Anemia, unspecified: Secondary | ICD-10-CM | POA: Insufficient documentation

## 2014-08-01 LAB — CBC WITH DIFFERENTIAL/PLATELET
Basophils Absolute: 0 10*3/uL (ref 0.0–0.1)
Basophils Relative: 0 % (ref 0–1)
EOS ABS: 0 10*3/uL (ref 0.0–0.7)
Eosinophils Relative: 1 % (ref 0–5)
HCT: 25.3 % — ABNORMAL LOW (ref 36.0–46.0)
HEMOGLOBIN: 7.9 g/dL — AB (ref 12.0–15.0)
LYMPHS ABS: 0.4 10*3/uL — AB (ref 0.7–4.0)
LYMPHS PCT: 16 % (ref 12–46)
MCH: 27.8 pg (ref 26.0–34.0)
MCHC: 31.2 g/dL (ref 30.0–36.0)
MCV: 89.1 fL (ref 78.0–100.0)
Monocytes Absolute: 0.2 10*3/uL (ref 0.1–1.0)
Monocytes Relative: 7 % (ref 3–12)
NEUTROS ABS: 2 10*3/uL (ref 1.7–7.7)
NEUTROS PCT: 76 % (ref 43–77)
Platelets: 175 10*3/uL (ref 150–400)
RBC: 2.84 MIL/uL — AB (ref 3.87–5.11)
RDW: 17.2 % — ABNORMAL HIGH (ref 11.5–15.5)
WBC: 2.7 10*3/uL — ABNORMAL LOW (ref 4.0–10.5)

## 2014-08-01 LAB — COMPREHENSIVE METABOLIC PANEL
ALT: 11 U/L (ref 0–35)
AST: 23 U/L (ref 0–37)
Albumin: 3 g/dL — ABNORMAL LOW (ref 3.5–5.2)
Alkaline Phosphatase: 68 U/L (ref 39–117)
Anion gap: 6 (ref 5–15)
BUN: 6 mg/dL (ref 6–23)
CHLORIDE: 110 mmol/L (ref 96–112)
CO2: 23 mmol/L (ref 19–32)
Calcium: 8.5 mg/dL (ref 8.4–10.5)
Creatinine, Ser: 0.98 mg/dL (ref 0.50–1.10)
GFR calc Af Amer: 67 mL/min — ABNORMAL LOW (ref >90)
GFR, EST NON AFRICAN AMERICAN: 58 mL/min — AB (ref >90)
Glucose, Bld: 107 mg/dL — ABNORMAL HIGH (ref 70–99)
Potassium: 2.9 mmol/L — ABNORMAL LOW (ref 3.5–5.1)
Sodium: 139 mmol/L (ref 135–145)
TOTAL PROTEIN: 5.6 g/dL — AB (ref 6.0–8.3)
Total Bilirubin: 0.6 mg/dL (ref 0.3–1.2)

## 2014-08-01 MED ORDER — METRONIDAZOLE 500 MG PO TABS: 500.0000 mg | ORAL_TABLET | Freq: Three times a day (TID) | ORAL | Status: DC

## 2014-08-01 MED ORDER — HEPARIN SOD (PORK) LOCK FLUSH 100 UNIT/ML IV SOLN
500.0000 [IU] | Freq: Once | INTRAVENOUS | Status: AC
  Administered 2014-08-01: 500 [IU]
  Filled 2014-08-01: qty 5

## 2014-08-01 MED ORDER — METRONIDAZOLE 500 MG PO TABS
500.0000 mg | ORAL_TABLET | Freq: Once | ORAL | Status: AC
  Administered 2014-08-01: 500 mg via ORAL
  Filled 2014-08-01: qty 1

## 2014-08-01 MED ORDER — SODIUM CHLORIDE 0.9 % IV BOLUS (SEPSIS)
1000.0000 mL | Freq: Once | INTRAVENOUS | Status: AC
  Administered 2014-08-01: 1000 mL via INTRAVENOUS

## 2014-08-01 MED ORDER — POTASSIUM CHLORIDE CRYS ER 20 MEQ PO TBCR
40.0000 meq | EXTENDED_RELEASE_TABLET | Freq: Once | ORAL | Status: AC
  Administered 2014-08-01: 40 meq via ORAL
  Filled 2014-08-01: qty 2

## 2014-08-01 NOTE — ED Provider Notes (Signed)
CSN: [id number]     Arrival date & time 08/01/14  1751 History   First MD Initiated Contact with Patient 08/01/14 1006     Chief Complaint  Patient presents with  . Diarrhea  . Nausea     (Consider location/radiation/quality/duration/timing/severity/associated sxs/prior Treatment) HPI Comments: Patient presents with nausea and diarrhea. She has a history of breast cancer and is currently receiving chemotherapy. She's had some ongoing nausea related to the chemotherapy. She has a history of C. difficile colitis which was treated in January with a 15 day course of Flagyl. This started after antibiotics for a dental infection. At the same time she was treated for healthcare associated pneumonia. She's had no further episodes of C. difficile. She's had no further treatments for C. difficile. 4 days ago she started having watery diarrhea again. She was tested for C. difficile and yesterday was noted to be positive for C. difficile. She has not yet started treatment again for the C. difficile. She's been to the cancer center for the last 2 days and receive IV fluids and Zofran as well as Ativan for her nausea. She says her nausea is better but she's had ongoing diarrhea. She says today her diarrhea has improved a little bit. She denies abdominal pain. There is no fevers. She denies any dizziness. She does feel fatigued and  Patient is a 69 y.o. female presenting with diarrhea.  Diarrhea   Past Medical History  Diagnosis Date  . Anxiety   . Back pain   . H/O bladder problems   . Cholelithiasis   . Depression   . High blood pressure   . Hypercholesteremia   . Kidney stones   . Gum disease   . Complication of anesthesia     bp goes up  . Wears glasses   . Wears dentures     top   Past Surgical History  Procedure Laterality Date  . Colon biopsy    . Mouth surgery    . Spine surgery    . Colonoscopy    . Tubal ligation    . Lithotripsy    . Rhinoplasty    . Back surgery  1989    lumb  lam  . Breast biopsy Right 02/05/2014    invasive ductal cncer  . Abdominal hysterectomy  1982  . Cholecystectomy  2011    lap choli  . Radioactive seed guided mastectomy with axillary sentinel lymph node biopsy Right 02/27/2014    Procedure: RADIOACTIVE SEED GUIDED RIGHT PARTIAL MASTECTOMY WITH AXILLARY SENTINEL LYMPH NODE BIOPSY;  Surgeon: Stark Klein, MD;  Location: [facility];  Service: General;  Laterality: Right;  . Portacath placement N/A 02/27/2014    Procedure: INSERTION PORT-A-CATH;  Surgeon: Stark Klein, MD;  Location: [facility];  Service: General;  Laterality: N/A;   History reviewed. No pertinent family history. History  Substance Use Topics  . Smoking status: Former Smoker -- 0.00 packs/day    Quit date: 05/16/2014  . Smokeless tobacco: Not on file  . Alcohol Use: No   OB History    No data available     Review of Systems  Gastrointestinal: Positive for diarrhea.      Allergies  Ciprofloxacin; Demerol; Lidocaine; Other; Septra; Codeine; and Novocain  Home Medications   Prior to Admission medications   Medication Sig Start Date End Date Taking? Authorizing Provider  acetaminophen (TYLENOL) 325 MG tablet Take 650 mg by mouth every 6 (six) hours as needed for moderate pain (  pain).    Yes Historical Provider, MD  cholecalciferol (VITAMIN D) 1000 UNITS tablet Take 1,000 Units by mouth daily.    Yes Historical Provider, MD  diphenhydrAMINE (BENADRYL) 25 MG tablet Take 25 mg by mouth daily as needed for allergies (allergies).    Yes Historical Provider, MD  FLUoxetine (PROZAC) 10 MG capsule Take 1 capsule (10 mg total) by mouth daily. 05/26/14  Yes Hosie Poisson, MD  furosemide (LASIX) 40 MG tablet Take 1 tablet (40 mg total) by mouth daily as needed for fluid (Take a lasix 40 mg and Kdur 20 meq if your weight goes up 5 lbs). 07/01/14  Yes Donne Hazel, MD  lidocaine-prilocaine (EMLA) cream Apply 1 application topically once a week. On  Chemo Day (Monday). 03/27/14  Yes Historical Provider, MD  loperamide (IMODIUM) 2 MG capsule Take 2 mg by mouth daily as needed for diarrhea or loose stools.   Yes Historical Provider, MD  LORazepam (ATIVAN) 0.5 MG tablet Take 1 tablet (0.5 mg total) by mouth every 8 (eight) hours as needed. Nausea 07/27/14  Yes Nicholas Lose, MD  ondansetron (ZOFRAN) 8 MG tablet Take 8 mg by mouth daily as needed for nausea (nausea).  03/30/14  Yes Historical Provider, MD  potassium chloride (MICRO-K) 10 MEQ CR capsule Take 2 capsules (20 mEq total) by mouth daily. 07/27/14  Yes Nicholas Lose, MD  Probiotic Product (PROBIOTIC DAILY PO) Take 1 tablet by mouth daily.    Yes Historical Provider, MD  metroNIDAZOLE (FLAGYL) 500 MG tablet Take 1 tablet (500 mg total) by mouth 3 (three) times daily. One po bid x 15 days 08/01/14   Malvin Johns, MD   BP 130/69 mmHg  Pulse 87  Temp(Src) 98.5 F (36.9 C) (Oral)  Resp 20  SpO2 95% Physical Exam  ED Course  Procedures (including critical care time) Labs Review Labs Reviewed  COMPREHENSIVE METABOLIC PANEL - Abnormal; Notable for the following:    Potassium 2.9 (*)    Glucose, Bld 107 (*)    Total Protein 5.6 (*)    Albumin 3.0 (*)    GFR calc non Af Amer 58 (*)    GFR calc Af Amer 67 (*)    All other components within normal limits  CBC WITH DIFFERENTIAL/PLATELET - Abnormal; Notable for the following:    WBC 2.7 (*)    RBC 2.84 (*)    Hemoglobin 7.9 (*)    HCT 25.3 (*)    RDW 17.2 (*)    Lymphs Abs 0.4 (*)    All other components within normal limits    Imaging Review No results found.   EKG Interpretation None      MDM   Final diagnoses:  C. difficile colitis  Hypokalemia  Anemia due to other cause    Patient is feeling better after IV fluids. This is her second recurrence of the C. difficile. I will treat her with a second round of Flagyl. Her potassium is also low. She was given a dose of potassium here in the ED. She really wants to be  discharged home. She was given a liter of IV fluids in the ED and is feeling better. Her hemoglobin has dropped lower than her baseline values. I consult to aid with her oncologist, Dr. Lindi Adie.  Patient has an appointment with him on Monday and he will arrange for her to get a blood transfusion at that time. He is okay with her being discharged home. He recommended giving her a prescription for  potassium pills but she says that she are he has a prescription for potassium to take at home. She will return to the ED if her symptoms worsen.    Malvin Johns, MD 08/01/14 [phone]

## 2014-08-01 NOTE — Assessment & Plan Note (Signed)
Patient has been suffering with chronic diarrhea since initiating chemotherapy.  Patient has been taking one Imodium intermittently for her diarrhea.  Patient feels dehydrated today; and will receive one liter normal saline IV fluid rehydration while at the cancer Center today.she was also advised to take Imodium 2 tablets every 6 hours to help with her diarrhea.

## 2014-08-01 NOTE — Assessment & Plan Note (Signed)
Patient received cycle 2, day 8 of her Abraxane chemotherapy on 07/27/2014.  Patient is scheduled for cycle 2, day 15 of the same regimen on 08/02/2004.

## 2014-08-01 NOTE — Assessment & Plan Note (Signed)
Patient complaining of chronic nausea/vomiting and diarrhea for the past several days.  She also feels dehydrated today. Most likely, the nausea/vomiting and diarrhea or secondary to chemotherapy side effect.  Patient received 1 L normal saline IV fluid rehydration today while at the [facility]. Patient is also advised to continue pushing fluids is much as possible.

## 2014-08-01 NOTE — Progress Notes (Addendum)
ED CM received call from [facility] for prescription clarification.  Clarification provided, No further questions or concerns currently.

## 2014-08-01 NOTE — ED Notes (Signed)
IV team will get blood

## 2014-08-01 NOTE — Assessment & Plan Note (Signed)
Patient is complaining of chronic fatigue most likely secondary to chemotherapy/nausea/vomiting/diarrhea and dehydration.  Hopefully, receiving the IV fluid rate today will help with her fatigue.  Also, patient received physical therapy twice weekly.

## 2014-08-01 NOTE — Discharge Instructions (Signed)
Clostridium Difficile Infection °Clostridium difficile (C. difficile) is a bacteria found in the intestinal tract or colon. Under certain conditions, it causes diarrhea and sometimes severe disease. The severe form of the disease is known as pseudomembranous colitis (often called C. difficile colitis). This disease can damage the lining of the colon or cause the colon to become enlarged (toxic megacolon). °CAUSES °Your colon normally contains many different bacteria, including C. difficile. The balance of bacteria in your colon can change during illness. This is especially true when you take antibiotic medicine. Taking antibiotics may allow the C. difficile to grow, multiply excessively, and make a toxin that then causes illness. The elderly and people with certain medical conditions have a greater risk of getting C. difficile infections. °SYMPTOMS °· Watery diarrhea. °· Fever. °· Fatigue. °· Loss of appetite. °· Nausea. °· Abdominal swelling, pain, or tenderness. °· Dehydration. °DIAGNOSIS °Your symptoms may make your caregiver suspect a C. difficile infection, especially if you have used antibiotics in the preceding weeks. However, there are only 2 ways to know for certain whether you have a C. difficile infection: °· A lab test that finds the toxin in your stool. °· The specific appearance of an abnormality (pseudomembrane) in your colon. This can only be seen by doing a sigmoidoscopy or colonoscopy. These procedures involve passing an instrument through your rectum to look at the inside of your colon. °Your caregiver will help determine if these tests are necessary. °TREATMENT °· Most people are successfully treated with one of two specific antibiotics, usually given by mouth. Other antibiotics you are receiving are stopped if possible. °· Intravenous (IV) fluids and correction of electrolyte imbalance may be necessary. °· Rarely, surgery may be needed to remove the infected part of the intestines. °· Careful  hand washing by you and your caregivers is important to prevent the spread of infection. In the hospital, your caregivers may also put on gowns and gloves to prevent the spread of the C. difficile bacteria. Your room is also cleaned regularly with a solution containing bleach or a product that is known to kill C. difficile. °HOME CARE INSTRUCTIONS °· Drink enough fluids to keep your urine clear or pale yellow. Avoid milk, caffeine, and alcohol. °· Ask your caregiver for specific rehydration instructions. °· Try eating small, frequent meals rather than large meals. °· Take your antibiotics as directed. Finish them even if you start to feel better. °· Do not use medicines to slow diarrhea. This could delay healing or cause complications. °· Wash your hands thoroughly after using the bathroom and before preparing food. °· Make sure people who live with you wash their hands often, too. °· Carefully disinfect all surfaces with a product that contains chlorine bleach. °SEEK MEDICAL CARE IF: °· Diarrhea persists longer than expected or recurs after completing your course of antibiotic treatment for the C. difficile infection. °· You have trouble staying hydrated. °SEEK IMMEDIATE MEDICAL CARE IF: °· You develop a new fever. °· You have increasing abdominal pain or tenderness. °· There is blood in your stools, or your stools are dark black and tarry. °· You cannot hold down food or liquids. °MAKE SURE YOU: °· Understand these instructions. °· Will watch your condition. °· Will get help right away if you are not doing well or get worse. °Document Released: 02/08/2005 Document Revised: 09/15/2013 Document Reviewed: 10/07/2010 °Exit[employer]® Patient Information ©2015 [employer]. This information is not intended to replace advice given to you by your health care provider. Make sure you   discuss any questions you have with your health care provider. ° °

## 2014-08-01 NOTE — Assessment & Plan Note (Signed)
Chemotherapy-induced anemia with hemoglobin fairly stable at 8.9.  Patient denies any worsening fatigue or shortness of breath with exertion.

## 2014-08-01 NOTE — ED Notes (Signed)
Attempted port access, unable to draw blood or flush after accessing. Notified IV team

## 2014-08-01 NOTE — Assessment & Plan Note (Signed)
Chemotherapy-induced nausea/vomiting continues and is chronic.  Patient has been taken Zofran and Ativan at home with only minimal effectiveness.  Patient was given Zofran IV while at the [facility] today.  Patient was able to drink fluids with no difficulty prior to discharge today.

## 2014-08-01 NOTE — ED Notes (Signed)
Pt has had diarrhea since Thursday. Has been given antibiotics x 2 for c-diff + testing.  Pt is chemo patient.  Last treatment was last week.  Unable to eat/drink.  Pt states it goes right through her

## 2014-08-01 NOTE — Assessment & Plan Note (Signed)
Patient complaining of chronic lack of appetite and weight loss.  Most likely, like appetite secondary to chemotherapy.  Patient was encouraged to eat multiple small meals throughout the day and to push fluids.

## 2014-08-01 NOTE — Assessment & Plan Note (Signed)
Potassium today was 3.1.  Patient was given potassium 40 mEq orally while at the [facility] today.  Confirmed patient already has potassium 10 mEq capsules at home.  Patient advised to increase potassium to 20 mEq per day.  Most likely, hypokalemia secondary to both nausea/vomiting and diarrhea.  Patient was also encouraged to push potassium rich diet.

## 2014-08-01 NOTE — Assessment & Plan Note (Signed)
Patient has a chronic rash to her bilateral forearms; with the left arm greater than the right.  Patient has been instructed to use hydrocortisone cream to the rash.  Most likely, this is a chemotherapy side effect as well.  Patient denies any specific pruritusto the rash.  Chills denies any other allergic rash-type reaction whatsoever.

## 2014-08-01 NOTE — Progress Notes (Signed)
 SYMPTOM MANAGEMENT CLINIC   HPI: Cheyenne Riggs 69 y.o. female diagnosed with breast cancer.  Patient currently undergoing Abraxane chemotherapy regimen.  Patient called the cancer Center today requesting urgent care visit.  Patient is complaining of chronic nausea/vomiting and diarrhea.  She feels dehydrated today.  She has had little appetite and poor oral intake.  She's also complaining of increased fatigue as well.  She is noted some cracking of her fingernails recently.  She also has a chronic rash to her bilateral forearms; but denies any pruritus.  She has been using hydrocortisone to her rash occasionally.  She denies any other allergic type symptoms.  Denies any recent fevers or chills.   HPI  ROS  Past Medical History  Diagnosis Date  . Anxiety   . Back pain   . H/O bladder problems   . Cholelithiasis   . Depression   . High blood pressure   . Hypercholesteremia   . Kidney stones   . Gum disease   . Complication of anesthesia     bp goes up  . Wears glasses   . Wears dentures     top    Past Surgical History  Procedure Laterality Date  . Colon biopsy    . Mouth surgery    . Spine surgery    . Colonoscopy    . Tubal ligation    . Lithotripsy    . Rhinoplasty    . Back surgery  1989    lumb lam  . Breast biopsy Right 02/05/2014    invasive ductal cncer  . Abdominal hysterectomy  1982  . Cholecystectomy  2011    lap choli  . Radioactive seed guided mastectomy with axillary sentinel lymph node biopsy Right 02/27/2014    Procedure: RADIOACTIVE SEED GUIDED RIGHT PARTIAL MASTECTOMY WITH AXILLARY SENTINEL LYMPH NODE BIOPSY;  Surgeon: Faera Byerly, MD;  Location: [facility];  Service: General;  Laterality: Right;  . Portacath placement N/A 02/27/2014    Procedure: INSERTION PORT-A-CATH;  Surgeon: Faera Byerly, MD;  Location: [facility];  Service: General;  Laterality: N/A;    has Breast cancer of upper-outer quadrant of right female  breast; Essential hypertension, benign; Neutropenic fever; SIRS (systemic inflammatory response syndrome); Diarrhea; Enteritis due to Clostridium difficile; Atypical pneumonia; CHF (congestive heart failure); Antineoplastic chemotherapy induced anemia; Nausea with vomiting; Dehydration; Fatigue; Anorexia; Rash; Hand foot syndrome; Hypokalemia; and Hypoalbuminemia on her problem list.    is allergic to ciprofloxacin; demerol; lidocaine; other; septra; codeine; and novocain.    Medication List       This list is accurate as of: 07/30/14 11:59 PM.  Always use your most recent med list.               acetaminophen 325 MG tablet  Commonly known as:  TYLENOL  Take 650 mg by mouth every 6 (six) hours as needed for moderate pain (pain).     cholecalciferol 1000 UNITS tablet  Commonly known as:  VITAMIN D  Take 1,000 Units by mouth daily.     diphenhydrAMINE 25 MG tablet  Commonly known as:  BENADRYL  Take 25 mg by mouth daily as needed for allergies (allergies).     FLUoxetine 10 MG capsule  Commonly known as:  PROZAC  Take 1 capsule (10 mg total) by mouth daily.     furosemide 40 MG tablet  Commonly known as:  LASIX  Take 1 tablet (40 mg total) by mouth daily as needed   for fluid (Take a lasix 40 mg and Kdur 20 meq if your weight goes up 5 lbs).     lidocaine-prilocaine cream  Commonly known as:  EMLA  Apply 1 application topically once a week. On Chemo Day (Monday).     loperamide 2 MG capsule  Commonly known as:  IMODIUM  Take 2 mg by mouth daily as needed for diarrhea or loose stools.     LORazepam 0.5 MG tablet  Commonly known as:  ATIVAN  Take 1 tablet (0.5 mg total) by mouth every 8 (eight) hours as needed. Nausea     MAALOX ADVANCED MAX ST 1000-60 MG Chew  Generic drug:  Calcium Carbonate-Simethicone  Chew 1 tablet by mouth every 6 (six) hours as needed (indigestion). Heart burn     ondansetron 8 MG tablet  Commonly known as:  ZOFRAN  Take 8 mg by mouth daily as  needed for nausea (nausea).     potassium chloride 10 MEQ CR capsule  Commonly known as:  MICRO-K  Take 2 capsules (20 mEq total) by mouth daily.     PROBIOTIC DAILY PO  Take 1 tablet by mouth daily.     ranitidine 150 MG tablet  Commonly known as:  ZANTAC  Take 150 mg by mouth 2 (two) times daily as needed for heartburn.         PHYSICAL EXAMINATION  Oncology Vitals 08/01/2014 08/01/2014 08/01/2014 07/31/2014 07/30/2014 07/30/2014 07/27/2014  Height - - - - - - -  Weight - - - - - 79.425 kg -  Weight (lbs) - - - - - 175 lbs 2 oz -  BMI (kg/m2) - - - - - - -  Temp - - 98.5 98.5 98.1 97.8 -  Pulse 97 87 96 80 82 84 91  Resp _0 SpO2 98 95 97 - 100 100 100  BSA (m2) - - - - - - -   BP Readings from Last 3 Encounters:  08/01/14 152/60  07/31/14 141/65  07/30/14 119/60    Physical Exam  Constitutional: She is oriented to person, place, and time. Vital signs are normal. She appears unhealthy.  HENT:  Head: Normocephalic and atraumatic.  Mouth/Throat: Oropharynx is clear and moist.  Eyes: Conjunctivae and EOM are normal. Pupils are equal, round, and reactive to light. Right eye exhibits no discharge. Left eye exhibits no discharge. No scleral icterus.  Neck: Normal range of motion. Neck supple. No JVD present. No tracheal deviation present. No thyromegaly present.  Cardiovascular: Normal rate, regular rhythm, normal heart sounds and intact distal pulses.   Pulmonary/Chest: Effort normal and breath sounds normal. No respiratory distress. She has no wheezes. She has no rales. She exhibits no tenderness.  Abdominal: Soft. Bowel sounds are normal. She exhibits no distension and no mass. There is no tenderness. There is no rebound and no guarding.  Musculoskeletal: Normal range of motion. She exhibits no edema or tenderness.  Lymphadenopathy:    She has no cervical adenopathy.  Neurological: She is alert and oriented to person, place, and time. Gait normal.  Skin:  Skin is warm and dry. Rash noted. No erythema. There is pallor.  Patient with dry rash to bilateral forearms; with the left forearm greater than the right.  No active infection noted.  Psychiatric: Affect normal.  Nursing note and vitals reviewed.   LABORATORY DATA:. Appointment on 07/30/2014  Component Date Value Ref Range Status  . WBC 07/30/2014 2.9* 3.9 - 10.3  10e3/uL Final  . NEUT# 07/30/2014 2.4  1.5 - 6.5 10e3/uL Final  . HGB 07/30/2014 8.9* 11.6 - 15.9 g/dL Final  . HCT 07/30/2014 27.9* 34.8 - 46.6 % Final  . Platelets 07/30/2014 197  145 - 400 10e3/uL Final  . MCV 07/30/2014 87.8  79.5 - 101.0 fL Final  . MCH 07/30/2014 28.1  25.1 - 34.0 pg Final  . MCHC 07/30/2014 32.0  31.5 - 36.0 g/dL Final  . RBC 07/30/2014 3.18* 3.70 - 5.45 10e6/uL Final  . RDW 07/30/2014 18.5* 11.2 - 14.5 % Final  . lymph# 07/30/2014 0.4* 0.9 - 3.3 10e3/uL Final  . MONO# 07/30/2014 0.1  0.1 - 0.9 10e3/uL Final  . Eosinophils Absolute 07/30/2014 0.0  0.0 - 0.5 10e3/uL Final  . Basophils Absolute 07/30/2014 0.0  0.0 - 0.1 10e3/uL Final  . NEUT% 07/30/2014 82.0* 38.4 - 76.8 % Final  . LYMPH% 07/30/2014 13.9* 14.0 - 49.7 % Final  . MONO% 07/30/2014 3.6  0.0 - 14.0 % Final  . EOS% 07/30/2014 0.1  0.0 - 7.0 % Final  . BASO% 07/30/2014 0.4  0.0 - 2.0 % Final  . Sodium 07/30/2014 142  136 - 145 mEq/L Final  . Potassium 07/30/2014 3.1* 3.5 - 5.1 mEq/L Final  . Chloride 07/30/2014 106  98 - 109 mEq/L Final  . CO2 07/30/2014 24  22 - 29 mEq/L Final  . Glucose 07/30/2014 114  70 - 140 mg/dl Final  . BUN 07/30/2014 8.6  7.0 - 26.0 mg/dL Final  . Creatinine 07/30/2014 0.9  0.6 - 1.1 mg/dL Final  . Total Bilirubin 07/30/2014 1.03  0.20 - 1.20 mg/dL Final  . Alkaline Phosphatase 07/30/2014 82  40 - 150 U/L Final  . AST 07/30/2014 18  5 - 34 U/L Final  . ALT 07/30/2014 11  0 - 55 U/L Final  . Total Protein 07/30/2014 5.8* 6.4 - 8.3 g/dL Final  . Albumin 07/30/2014 3.0* 3.5 - 5.0 g/dL Final  . Calcium 07/30/2014  8.6  8.4 - 10.4 mg/dL Final  . Anion Gap 07/30/2014 12* 3 - 11 mEq/L Final  . EGFR 07/30/2014 70* >90 ml/min/1.73 m2 Final   eGFR is calculated using the CKD-EPI Creatinine Equation (2009)     RADIOGRAPHIC STUDIES: No results found.  ASSESSMENT/PLAN:    Anorexia Patient complaining of chronic lack of appetite and weight loss.  Most likely, like appetite secondary to chemotherapy.  Patient was encouraged to eat multiple small meals throughout the day and to push fluids.   Antineoplastic chemotherapy induced anemia Chemotherapy-induced anemia with hemoglobin fairly stable at 8.9.  Patient denies any worsening fatigue or shortness of breath with exertion.   Breast cancer of upper-outer quadrant of right female breast Patient received cycle 2, day 8 of her Abraxane chemotherapy on 07/27/2014.  Patient is scheduled for cycle 2, day 15 of the same regimen on 08/02/2004.   Dehydration Patient complaining of chronic nausea/vomiting and diarrhea for the past several days.  She also feels dehydrated today. Most likely, the nausea/vomiting and diarrhea or secondary to chemotherapy side effect.  Patient received 1 L normal saline IV fluid rehydration today while at the [place] [facility]. Patient is also advised to continue pushing fluids is much as possible.   Diarrhea Patient has been suffering with chronic diarrhea since initiating chemotherapy.  Patient has been taking one Imodium intermittently for her diarrhea.  Patient feels dehydrated today; and will receive one liter normal saline IV fluid rehydration while at the cancer Center today.she  was also advised to take Imodium 2 tablets every 6 hours to help with her diarrhea.   Fatigue Patient is complaining of chronic fatigue most likely secondary to chemotherapy/nausea/vomiting/diarrhea and dehydration.  Hopefully, receiving the IV fluid rate today will help with her fatigue.  Also, patient received physical therapy twice weekly.   Hand foot  syndrome Chemotherapy-induced hand/foot syndrome noted; with mild fingernail cracking noted.  Patient was encouraged to keep her hands protected from extremes in temperature; and also to keep her hands well moisturized.   Hypoalbuminemia Albumin continues low at 3.0.  Patient was encouraged to push protein in her diet is much as possible.   Hypokalemia Potassium today was 3.1.  Patient was given potassium 40 mEq orally while at the [facility] today.  Confirmed patient already has potassium 10 mEq capsules at home.  Patient advised to increase potassium to 20 mEq per day.  Most likely, hypokalemia secondary to both nausea/vomiting and diarrhea.  Patient was also encouraged to push potassium rich diet.   Nausea with vomiting Chemotherapy-induced nausea/vomiting continues and is chronic.  Patient has been taken Zofran and Ativan at home with only minimal effectiveness.  Patient was given Zofran IV while at the [facility] today.  Patient was able to drink fluids with no difficulty prior to discharge today.   Rash Patient has a chronic rash to her bilateral forearms; with the left arm greater than the right.  Patient has been instructed to use hydrocortisone cream to the rash.  Most likely, this is a chemotherapy side effect as well.  Patient denies any specific pruritusto the rash.  Chills denies any other allergic rash-type reaction whatsoever.   Patient stated understanding of all instructions; and was in agreement with this plan of care. The patient knows to call the clinic with any problems, questions or concerns.   Review/collaboration with Dr.  Lindi Adie regarding all aspects of patient's visit today.   Total time spent with patient was 40 minutes;  with greater than 75 percent of that time spent in face to face counseling regarding patient's symptoms,  and coordination of care and follow up.  Disclaimer: This note was dictated with voice recognition software. Similar sounding words  can inadvertently be transcribed and may not be corrected upon review.   Drue Second, NP 08/01/2014

## 2014-08-01 NOTE — Assessment & Plan Note (Signed)
Patient has been experiencing some chronic diarrhea for the past several days.  She has been taking 1 Imodium with little effectiveness.  She was encouraged yesterday to increase the Imodium to 2 tablets every 4-6 hours to maximum 8 per day.  She received IV fluid rehydration yesterday while at the [facility]; and experienced several more episodes of diarrhea when she returned back home.  Patient returned to the [facility] today for additional IV fluid rehydration secondary to dehydration.  Brought in a stool sample to check for C. Difficile; since she does have a history of C. Difficile past.  Of note-the patient is an eligible for Lomotil; since she does list Demerol as an allergy.

## 2014-08-01 NOTE — Assessment & Plan Note (Signed)
Albumin continues low at 3.0.  Patient was encouraged to push protein in her diet is much as possible.

## 2014-08-01 NOTE — Assessment & Plan Note (Signed)
Patient has been experiencing both nausea/vomiting and diarrhea for the past several days; and feels fairly dehydrated.  She received 1 L normal saline IV fluid rehydration yesterday while at the [facility].  Patient states that she did initially feel much better on day rehydration; and had several episodes of diarrhea when she returned home.  Patient will receive additional IV fluid rehydration today while at the [facility].  She was also encouraged to push fluids as much as possible.

## 2014-08-01 NOTE — Progress Notes (Signed)
SYMPTOM MANAGEMENT CLINIC   HPI: Cheyenne Riggs 69 y.o. female diagnosed with breast cancer.  Currently undergoing Abraxane chemotherapy regimen.  Patient received cycle 2, day 8 of her Abraxane chemotherapy on 07/27/2014.  She presented to the [facility] yesterday with complaint of nausea/vomiting and diarrhea.  She was dehydrated; and received IV fluid rehydration.  She initially felt better following the IV fluid; but then developed more episodes of diarrhea when she returned back home.    Patient presented back to the [facility] today with complaint of continued diarrhea; despite taking Imodium.  She continues feeling dehydrated.  She will receive additional IV fluid rehydration while at the [facility] today.  Also, patient reports that she has a history of significant past.  Patient was able to bring in a stool sample today to rule out C. Difficile.  Patient denies any recent fevers or chills.   HPI  ROS  Past Medical History  Diagnosis Date  . Anxiety   . Back pain   . H/O bladder problems   . Cholelithiasis   . Depression   . High blood pressure   . Hypercholesteremia   . Kidney stones   . Gum disease   . Complication of anesthesia     bp goes up  . Wears glasses   . Wears dentures     top    Past Surgical History  Procedure Laterality Date  . Colon biopsy    . Mouth surgery    . Spine surgery    . Colonoscopy    . Tubal ligation    . Lithotripsy    . Rhinoplasty    . Back surgery  1989    lumb lam  . Breast biopsy Right 02/05/2014    invasive ductal cncer  . Abdominal hysterectomy  1982  . Cholecystectomy  2011    lap choli  . Radioactive seed guided mastectomy with axillary sentinel lymph node biopsy Right 02/27/2014    Procedure: RADIOACTIVE SEED GUIDED RIGHT PARTIAL MASTECTOMY WITH AXILLARY SENTINEL LYMPH NODE BIOPSY;  Surgeon: Stark Klein, MD;  Location: [facility];  Service: General;  Laterality: Right;  . Portacath placement  N/A 02/27/2014    Procedure: INSERTION PORT-A-CATH;  Surgeon: Stark Klein, MD;  Location: [facility];  Service: General;  Laterality: N/A;    has Breast cancer of upper-outer quadrant of right female breast; Essential hypertension, benign; Neutropenic fever; SIRS (systemic inflammatory response syndrome); Diarrhea; Enteritis due to Clostridium difficile; Atypical pneumonia; CHF (congestive heart failure); Antineoplastic chemotherapy induced anemia; Nausea with vomiting; Dehydration; Fatigue; Anorexia; Rash; Hand foot syndrome; Hypokalemia; and Hypoalbuminemia on her problem list.    is allergic to ciprofloxacin; demerol; lidocaine; other; septra; codeine; and novocain.    Medication List       This list is accurate as of: 07/31/14 11:59 PM.  Always use your most recent med list.               acetaminophen 325 MG tablet  Commonly known as:  TYLENOL  Take 650 mg by mouth every 6 (six) hours as needed for moderate pain (pain).     cholecalciferol 1000 UNITS tablet  Commonly known as:  VITAMIN D  Take 1,000 Units by mouth daily.     diphenhydrAMINE 25 MG tablet  Commonly known as:  BENADRYL  Take 25 mg by mouth daily as needed for allergies (allergies).     FLUoxetine 10 MG capsule  Commonly known as:  PROZAC  Take 1 capsule (10 mg total) by mouth daily.     furosemide 40 MG tablet  Commonly known as:  LASIX  Take 1 tablet (40 mg total) by mouth daily as needed for fluid (Take a lasix 40 mg and Kdur 20 meq if your weight goes up 5 lbs).     lidocaine-prilocaine cream  Commonly known as:  EMLA  Apply 1 application topically once a week. On Chemo Day (Monday).     loperamide 2 MG capsule  Commonly known as:  IMODIUM  Take 2 mg by mouth daily as needed for diarrhea or loose stools.     LORazepam 0.5 MG tablet  Commonly known as:  ATIVAN  Take 1 tablet (0.5 mg total) by mouth every 8 (eight) hours as needed. Nausea     MAALOX ADVANCED MAX ST 1000-60 MG Chew    Generic drug:  Calcium Carbonate-Simethicone  Chew 1 tablet by mouth every 6 (six) hours as needed (indigestion). Heart burn     ondansetron 8 MG tablet  Commonly known as:  ZOFRAN  Take 8 mg by mouth daily as needed for nausea (nausea).     potassium chloride 10 MEQ CR capsule  Commonly known as:  MICRO-K  Take 2 capsules (20 mEq total) by mouth daily.     PROBIOTIC DAILY PO  Take 1 tablet by mouth daily.     ranitidine 150 MG tablet  Commonly known as:  ZANTAC  Take 150 mg by mouth 2 (two) times daily as needed for heartburn.         PHYSICAL EXAMINATION  Oncology Vitals 08/01/2014 08/01/2014 08/01/2014 07/31/2014 07/30/2014 07/30/2014 07/27/2014  Height - - - - - - -  Weight - - - - - 79.425 kg -  Weight (lbs) - - - - - 175 lbs 2 oz -  BMI (kg/m2) - - - - - - -  Temp - - 98.5 98.5 98.1 97.8 -  Pulse 97 87 96 80 82 84 91  Resp $Rem'18 20 19 14 20 19 20  'pbVG$ SpO2 98 95 97 - 100 100 100  BSA (m2) - - - - - - -   BP Readings from Last 3 Encounters:  08/01/14 152/60  07/31/14 141/65  07/30/14 119/60    Physical Exam  Constitutional: She is oriented to person, place, and time. Vital signs are normal. She appears dehydrated. She appears unhealthy.  HENT:  Head: Normocephalic and atraumatic.  Mouth/Throat: Oropharynx is clear and moist.  Eyes: Conjunctivae and EOM are normal. Pupils are equal, round, and reactive to light. Right eye exhibits no discharge. Left eye exhibits no discharge. No scleral icterus.  Neck: Normal range of motion.  Pulmonary/Chest: Effort normal. No respiratory distress.  Musculoskeletal: Normal range of motion.  Neurological: She is alert and oriented to person, place, and time. Gait normal.  Skin: Skin is warm and dry. There is pallor.  Psychiatric: Affect normal.  Nursing note and vitals reviewed.   LABORATORY DATA:. Hospital Outpatient Visit on 07/31/2014  Component Date Value Ref Range Status  . C difficile by pcr 07/31/2014 POSITIVE* NEGATIVE Final    Comment: CRITICAL RESULT CALLED TO, READ BACK BY AND VERIFIED WITH: INFORMED LORIE EPPERSON (HOUSE COVERAGE) $RemoveBeforeDE'@2030'[id number]$  ON 3.18.16 BY MCCOY,N.   Appointment on 07/30/2014  Component Date Value Ref Range Status  . WBC 07/30/2014 2.9* 3.9 - 10.3 10e3/uL Final  . NEUT# 07/30/2014 2.4  1.5 - 6.5 10e3/uL Final  . HGB 07/30/2014 8.9* 11.6 - 15.9 g/dL  Final  . HCT 07/30/2014 27.9* 34.8 - 46.6 % Final  . Platelets 07/30/2014 197  145 - 400 10e3/uL Final  . MCV 07/30/2014 87.8  79.5 - 101.0 fL Final  . MCH 07/30/2014 28.1  25.1 - 34.0 pg Final  . MCHC 07/30/2014 32.0  31.5 - 36.0 g/dL Final  . RBC 07/30/2014 3.18* 3.70 - 5.45 10e6/uL Final  . RDW 07/30/2014 18.5* 11.2 - 14.5 % Final  . lymph# 07/30/2014 0.4* 0.9 - 3.3 10e3/uL Final  . MONO# 07/30/2014 0.1  0.1 - 0.9 10e3/uL Final  . Eosinophils Absolute 07/30/2014 0.0  0.0 - 0.5 10e3/uL Final  . Basophils Absolute 07/30/2014 0.0  0.0 - 0.1 10e3/uL Final  . NEUT% 07/30/2014 82.0* 38.4 - 76.8 % Final  . LYMPH% 07/30/2014 13.9* 14.0 - 49.7 % Final  . MONO% 07/30/2014 3.6  0.0 - 14.0 % Final  . EOS% 07/30/2014 0.1  0.0 - 7.0 % Final  . BASO% 07/30/2014 0.4  0.0 - 2.0 % Final  . Sodium 07/30/2014 142  136 - 145 mEq/L Final  . Potassium 07/30/2014 3.1* 3.5 - 5.1 mEq/L Final  . Chloride 07/30/2014 106  98 - 109 mEq/L Final  . CO2 07/30/2014 24  22 - 29 mEq/L Final  . Glucose 07/30/2014 114  70 - 140 mg/dl Final  . BUN 07/30/2014 8.6  7.0 - 26.0 mg/dL Final  . Creatinine 07/30/2014 0.9  0.6 - 1.1 mg/dL Final  . Total Bilirubin 07/30/2014 1.03  0.20 - 1.20 mg/dL Final  . Alkaline Phosphatase 07/30/2014 82  40 - 150 U/L Final  . AST 07/30/2014 18  5 - 34 U/L Final  . ALT 07/30/2014 11  0 - 55 U/L Final  . Total Protein 07/30/2014 5.8* 6.4 - 8.3 g/dL Final  . Albumin 07/30/2014 3.0* 3.5 - 5.0 g/dL Final  . Calcium 07/30/2014 8.6  8.4 - 10.4 mg/dL Final  . Anion Gap 07/30/2014 12* 3 - 11 mEq/L Final  . EGFR 07/30/2014 70* >90 ml/min/1.73 m2 Final    eGFR is calculated using the CKD-EPI Creatinine Equation (2009)     RADIOGRAPHIC STUDIES: No results found.  ASSESSMENT/PLAN:    Breast cancer of upper-outer quadrant of right female breast Patient received cycle 2, day 8 of her Abraxane chemotherapy on 07/27/2014.  Patient is scheduled for cycle 2, day 15 of the same regimen on 08/02/2004.     Dehydration Patient has been experiencing both nausea/vomiting and diarrhea for the past several days; and feels fairly dehydrated.  She received 1 L normal saline IV fluid rehydration yesterday while at the [facility].  Patient states that she did initially feel much better on day rehydration; and had several episodes of diarrhea when she returned home.  Patient will receive additional IV fluid rehydration today while at the [facility].  She was also encouraged to push fluids as much as possible.   Diarrhea Patient has been experiencing some chronic diarrhea for the past several days.  She has been taking 1 Imodium with little effectiveness.  She was encouraged yesterday to increase the Imodium to 2 tablets every 4-6 hours to maximum 8 per day.  She received IV fluid rehydration yesterday while at the [facility]; and experienced several more episodes of diarrhea when she returned back home.  Patient returned to the [facility] today for additional IV fluid rehydration secondary to dehydration.  Brought in a stool sample to check for C. Difficile; since she does have a history of C.  Difficile past.  Of note-the patient is an eligible for Lomotil; since she does list Demerol as an allergy.     Patient stated understanding of all instructions; and was in agreement with this plan of care. The patient knows to call the clinic with any problems, questions or concerns.   Review/collaboration with Dr. Lindi Adie regarding all aspects of patient's visit today.   Total time spent with patient was 25 minutes;  with greater than 75 percent of  that time spent in face to face counseling regarding patient's symptoms,  and coordination of care and follow up.  Disclaimer: This note was dictated with voice recognition software. Similar sounding words can inadvertently be transcribed and may not be corrected upon review.   Drue Second, NP 08/01/2014

## 2014-08-01 NOTE — Assessment & Plan Note (Signed)
Chemotherapy-induced hand/foot syndrome noted; with mild fingernail cracking noted.  Patient was encouraged to keep her hands protected from extremes in temperature; and also to keep her hands well moisturized.

## 2014-08-02 ENCOUNTER — Encounter: Payer: Self-pay | Admitting: Hematology and Oncology

## 2014-08-03 ENCOUNTER — Ambulatory Visit (HOSPITAL_BASED_OUTPATIENT_CLINIC_OR_DEPARTMENT_OTHER): Payer: Medicare Other

## 2014-08-03 ENCOUNTER — Other Ambulatory Visit: Payer: Self-pay

## 2014-08-03 ENCOUNTER — Ambulatory Visit: Payer: Medicare Other | Admitting: Nurse Practitioner

## 2014-08-03 ENCOUNTER — Telehealth: Payer: Self-pay | Admitting: *Deleted

## 2014-08-03 ENCOUNTER — Telehealth: Payer: Self-pay

## 2014-08-03 ENCOUNTER — Other Ambulatory Visit: Payer: Medicare Other

## 2014-08-03 ENCOUNTER — Ambulatory Visit (HOSPITAL_COMMUNITY)
Admission: RE | Admit: 2014-08-03 | Discharge: 2014-08-03 | Disposition: A | Discharge status: Home / Self Care | Payer: Medicare Other | Source: Ambulatory Visit | Attending: Hematology and Oncology | Admitting: Hematology and Oncology

## 2014-08-03 ENCOUNTER — Other Ambulatory Visit: Payer: Self-pay | Admitting: *Deleted

## 2014-08-03 VITALS — BP 130/66 | HR 81 | Temp 97.6°F | Resp 18

## 2014-08-03 DIAGNOSIS — R112 Nausea with vomiting, unspecified: Secondary | ICD-10-CM

## 2014-08-03 DIAGNOSIS — C50411 Malignant neoplasm of upper-outer quadrant of right female breast: Secondary | ICD-10-CM

## 2014-08-03 LAB — CBC WITH DIFFERENTIAL/PLATELET
BASO%: 0.5 % (ref 0.0–2.0)
BASOS ABS: 0 10*3/uL (ref 0.0–0.1)
EOS ABS: 0.1 10*3/uL (ref 0.0–0.5)
EOS%: 1.3 % (ref 0.0–7.0)
HCT: 25.5 % — ABNORMAL LOW (ref 34.8–46.6)
HGB: 8.2 g/dL — ABNORMAL LOW (ref 11.6–15.9)
LYMPH%: 14.1 % (ref 14.0–49.7)
MCH: 28.2 pg (ref 25.1–34.0)
MCHC: 32.2 g/dL (ref 31.5–36.0)
MCV: 87.6 fL (ref 79.5–101.0)
MONO#: 0.5 10*3/uL (ref 0.1–0.9)
MONO%: 13.3 % (ref 0.0–14.0)
NEUT%: 70.8 % (ref 38.4–76.8)
NEUTROS ABS: 2.7 10*3/uL (ref 1.5–6.5)
Platelets: 246 10*3/uL (ref 145–400)
RBC: 2.91 10*6/uL — ABNORMAL LOW (ref 3.70–5.45)
RDW: 17.5 % — ABNORMAL HIGH (ref 11.2–14.5)
WBC: 3.8 10*3/uL — ABNORMAL LOW (ref 3.9–10.3)
lymph#: 0.5 10*3/uL — ABNORMAL LOW (ref 0.9–3.3)
nRBC: 1 % — ABNORMAL HIGH (ref 0–0)

## 2014-08-03 LAB — COMPREHENSIVE METABOLIC PANEL (CC13)
ALT: 10 U/L (ref 0–55)
ANION GAP: 11 meq/L (ref 3–11)
AST: 20 U/L (ref 5–34)
Albumin: 2.9 g/dL — ABNORMAL LOW (ref 3.5–5.0)
Alkaline Phosphatase: 69 U/L (ref 40–150)
BILIRUBIN TOTAL: 0.53 mg/dL (ref 0.20–1.20)
BUN: 3.4 mg/dL — ABNORMAL LOW (ref 7.0–26.0)
CALCIUM: 8.8 mg/dL (ref 8.4–10.4)
CHLORIDE: 111 meq/L — AB (ref 98–109)
CO2: 20 mEq/L — ABNORMAL LOW (ref 22–29)
CREATININE: 1 mg/dL (ref 0.6–1.1)
EGFR: 55 mL/min/{1.73_m2} — AB (ref >90)
Glucose: 88 mg/dl (ref 70–140)
POTASSIUM: 3 meq/L — AB (ref 3.5–5.1)
SODIUM: 143 meq/L (ref 136–145)
Total Protein: 5.4 g/dL — ABNORMAL LOW (ref 6.4–8.3)

## 2014-08-03 LAB — TECHNOLOGIST REVIEW

## 2014-08-03 LAB — HOLD TUBE, BLOOD BANK

## 2014-08-03 LAB — PREPARE RBC (CROSSMATCH)

## 2014-08-03 MED ORDER — SODIUM CHLORIDE 0.9 % IV SOLN
Freq: Once | INTRAVENOUS | Status: AC
  Administered 2014-08-03: 11:00:00 via INTRAVENOUS
  Filled 2014-08-03: qty 1000

## 2014-08-03 MED ORDER — DIPHENHYDRAMINE HCL 25 MG PO CAPS
25.0000 mg | ORAL_CAPSULE | Freq: Once | ORAL | Status: AC
  Administered 2014-08-03: 25 mg via ORAL

## 2014-08-03 MED ORDER — POTASSIUM CHLORIDE ER 10 MEQ PO CPCR: 30.0000 meq | ORAL_CAPSULE | Freq: Every day | ORAL | Status: DC

## 2014-08-03 MED ORDER — HEPARIN SOD (PORK) LOCK FLUSH 100 UNIT/ML IV SOLN
500.0000 [IU] | Freq: Once | INTRAVENOUS | Status: AC
Start: 2014-08-03 — End: 2014-08-03
  Administered 2014-08-03: 500 [IU] via INTRAVENOUS
  Filled 2014-08-03: qty 5

## 2014-08-03 MED ORDER — ACETAMINOPHEN 325 MG PO TABS
650.0000 mg | ORAL_TABLET | Freq: Once | ORAL | Status: AC
  Administered 2014-08-03: 650 mg via ORAL

## 2014-08-03 MED ORDER — METRONIDAZOLE 500 MG PO TABS
500.0000 mg | ORAL_TABLET | Freq: Once | ORAL | Status: AC
  Administered 2014-08-03: 500 mg via ORAL
  Filled 2014-08-03: qty 1

## 2014-08-03 MED ORDER — SODIUM CHLORIDE 0.9 % IJ SOLN
10.0000 mL | INTRAMUSCULAR | Status: DC | PRN
  Administered 2014-08-03: 10 mL via INTRAVENOUS
  Filled 2014-08-03: qty 10

## 2014-08-03 MED ORDER — ACETAMINOPHEN 325 MG PO TABS
ORAL_TABLET | ORAL | Status: AC
  Filled 2014-08-03: qty 2

## 2014-08-03 MED ORDER — DIPHENHYDRAMINE HCL 25 MG PO CAPS
ORAL_CAPSULE | ORAL | Status: AC
  Filled 2014-08-03: qty 1

## 2014-08-03 MED ORDER — SODIUM CHLORIDE 0.9 % IV SOLN
Freq: Once | INTRAVENOUS | Status: AC
  Administered 2014-08-03: 12:00:00 via INTRAVENOUS
  Filled 2014-08-03: qty 4

## 2014-08-03 MED ORDER — SODIUM CHLORIDE 0.9 % IV SOLN
250.0000 mL | Freq: Once | INTRAVENOUS | Status: AC
  Administered 2014-08-03: 250 mL via INTRAVENOUS

## 2014-08-03 NOTE — Progress Notes (Signed)
Per Dr. Lindi Adie, pt to rcv 500 IVF w/10 meq potassium daily - orders entered under signed and held..  [facility] at 1 pm 3/22 3/23 and 3/24.  pof entered for 3/25 appt.  Pt called [facility] - notified of d/t.

## 2014-08-03 NOTE — Progress Notes (Signed)
K 3.0.  Per Dr. Lindi Adie, pt to increase potassium to 30 mEq/day.  Cheyenne Riggs infusion RN will notify patient and have patient call clinic when she needs refill.  Medication list updated.

## 2014-08-03 NOTE — Telephone Encounter (Signed)
Pt reports 15+ diarrhea Saturday evening and same on Sunday.  Pt was in Friday - tested positive for C-Diff.  Per Dr. Lindi Adie, no chemo today.  Draw labs - IVF, possible RBC transfusion.    Infusion room notified no chemo and ok'd add on fluids/blood.  Lab add appt placed.  Lab orders and IVF orders placed.

## 2014-08-03 NOTE — Progress Notes (Signed)
[facility] called for.  Lab called to release hold tube.  Infusion charge notified.

## 2014-08-03 NOTE — Patient Instructions (Signed)
Blood Transfusion Information WHAT IS A BLOOD TRANSFUSION? A transfusion is the replacement of blood or some of its parts. Blood is made up of multiple cells which provide different functions.  Red blood cells carry oxygen and are used for blood loss replacement.  White blood cells fight against infection.  Platelets control bleeding.  Plasma helps clot blood.  Other blood products are available for specialized needs, such as hemophilia or other clotting disorders. BEFORE THE TRANSFUSION  Who gives blood for transfusions?   You may be able to donate blood to be used at a later date on yourself (autologous donation).  Relatives can be asked to donate blood. This is generally not any safer than if you have received blood from a stranger. The same precautions are taken to ensure safety when a relative's blood is donated.  Healthy volunteers who are fully evaluated to make sure their blood is safe. This is blood bank blood. Transfusion therapy is the safest it has ever been in the practice of medicine. Before blood is taken from a donor, a complete history is taken to make sure that person has no history of diseases nor engages in risky social behavior (examples are intravenous drug use or sexual activity with multiple partners). The donor's travel history is screened to minimize risk of transmitting infections, such as malaria. The donated blood is tested for signs of infectious diseases, such as HIV and hepatitis. The blood is then tested to be sure it is compatible with you in order to minimize the chance of a transfusion reaction. If you or a relative donates blood, this is often done in anticipation of surgery and is not appropriate for emergency situations. It takes many days to process the donated blood. RISKS AND COMPLICATIONS Although transfusion therapy is very safe and saves many lives, the main dangers of transfusion include:   Getting an infectious disease.  Developing a  transfusion reaction. This is an allergic reaction to something in the blood you were given. Every precaution is taken to prevent this. The decision to have a blood transfusion has been considered carefully by your caregiver before blood is given. Blood is not given unless the benefits outweigh the risks. AFTER THE TRANSFUSION  Right after receiving a blood transfusion, you will usually feel much better and more energetic. This is especially true if your red blood cells have gotten low (anemic). The transfusion raises the level of the red blood cells which carry oxygen, and this usually causes an energy increase.  The nurse administering the transfusion will monitor you carefully for complications. HOME CARE INSTRUCTIONS  No special instructions are needed after a transfusion. You may find your energy is better. Speak with your caregiver about any limitations on activity for underlying diseases you may have. SEEK MEDICAL CARE IF:   Your condition is not improving after your transfusion.  You develop redness or irritation at the intravenous (IV) site. SEEK IMMEDIATE MEDICAL CARE IF:  Any of the following symptoms occur over the next 12 hours:  Shaking chills.  You have a temperature by mouth above 102 F (38.9 C), not controlled by medicine.  Chest, back, or muscle pain.  People around you feel you are not acting correctly or are confused.  Shortness of breath or difficulty breathing.  Dizziness and fainting.  You get a rash or develop hives.  You have a decrease in urine output.  Your urine turns a dark color or changes to pink, red, or brown. Any of the following   symptoms occur over the next 10 days:  You have a temperature by mouth above 102 F (38.9 C), not controlled by medicine.  Shortness of breath.  Weakness after normal activity.  The white part of the eye turns yellow (jaundice).  You have a decrease in the amount of urine or are urinating less often.  Your  urine turns a dark color or changes to pink, red, or brown. Document Released: 04/28/2000 Document Revised: 07/24/2011 Document Reviewed: 12/16/2007 [facility] Patient Information 2015 [place], [place]. This information is not intended to replace advice given to you by your health care provider. Make sure you discuss any questions you have with your health care provider. Hypokalemia Hypokalemia means that the amount of potassium in the blood is lower than normal.Potassium is a chemical, called an electrolyte, that helps regulate the amount of fluid in the body. It also stimulates muscle contraction and helps nerves function properly.Most of the body's potassium is inside of cells, and only a very small amount is in the blood. Because the amount in the blood is so small, minor changes can be life-threatening. CAUSES  Antibiotics.  Diarrhea or vomiting.  Using laxatives too much, which can cause diarrhea.  Chronic kidney disease.  Water pills (diuretics).  Eating disorders (bulimia).  Low magnesium level.  Sweating a lot. SIGNS AND SYMPTOMS  Weakness.  Constipation.  Fatigue.  Muscle cramps.  Mental confusion.  Skipped heartbeats or irregular heartbeat (palpitations).  Tingling or numbness. DIAGNOSIS  Your health care provider can diagnose hypokalemia with blood tests. In addition to checking your potassium level, your health care provider may also check other lab tests. TREATMENT Hypokalemia can be treated with potassium supplements taken by mouth or adjustments in your current medicines. If your potassium level is very low, you may need to get potassium through a vein (IV) and be monitored in the hospital. A diet high in potassium is also helpful. Foods high in potassium are:  Nuts, such as peanuts and pistachios.  Seeds, such as sunflower seeds and pumpkin seeds.  Peas, lentils, and lima beans.  Whole grain and bran cereals and breads.  Fresh fruit and vegetables,  such as apricots, avocado, bananas, cantaloupe, kiwi, oranges, tomatoes, asparagus, and potatoes.  Orange and tomato juices.  Red meats.  Fruit yogurt. HOME CARE INSTRUCTIONS  Take all medicines as prescribed by your health care provider.  Maintain a healthy diet by including nutritious food, such as fruits, vegetables, nuts, whole grains, and lean meats.  If you are taking a laxative, be sure to follow the directions on the label. SEEK MEDICAL CARE IF:  Your weakness gets worse.  You feel your heart pounding or racing.  You are vomiting or having diarrhea.  You are diabetic and having trouble keeping your blood glucose in the normal range. SEEK IMMEDIATE MEDICAL CARE IF:  You have chest pain, shortness of breath, or dizziness.  You are vomiting or having diarrhea for more than 2 days.  You faint. MAKE SURE YOU:   Understand these instructions.  Will watch your condition.  Will get help right away if you are not doing well or get worse. Document Released: 05/01/2005 Document Revised: 02/19/2013 Document Reviewed: 11/01/2012 [facility] Patient Information 2015 [place], [place]. This information is not intended to replace advice given to you by your health care provider. Make sure you discuss any questions you have with your health care provider. Dehydration, Adult Dehydration is when you lose more fluids from the body than you take in. Vital organs  like the kidneys, brain, and heart cannot function without a proper amount of fluids and salt. Any loss of fluids from the body can cause dehydration.  CAUSES   Vomiting.  Diarrhea.  Excessive sweating.  Excessive urine output.  Fever. SYMPTOMS  Mild dehydration  Thirst.  Dry lips.  Slightly dry mouth. Moderate dehydration  Very dry mouth.  Sunken eyes.  Skin does not bounce back quickly when lightly pinched and released.  Dark urine and decreased urine production.  Decreased tear  production.  Headache. Severe dehydration  Very dry mouth.  Extreme thirst.  Rapid, weak pulse (more than 100 beats per minute at rest).  Cold hands and feet.  Not able to sweat in spite of heat and temperature.  Rapid breathing.  Blue lips.  Confusion and lethargy.  Difficulty being awakened.  Minimal urine production.  No tears. DIAGNOSIS  Your caregiver will diagnose dehydration based on your symptoms and your exam. Blood and urine tests will help confirm the diagnosis. The diagnostic evaluation should also identify the cause of dehydration. TREATMENT  Treatment of mild or moderate dehydration can often be done at home by increasing the amount of fluids that you drink. It is best to drink small amounts of fluid more often. Drinking too much at one time can make vomiting worse. Refer to the home care instructions below. Severe dehydration needs to be treated at the hospital where you will probably be given intravenous (IV) fluids that contain water and electrolytes. HOME CARE INSTRUCTIONS   Ask your caregiver about specific rehydration instructions.  Drink enough fluids to keep your urine clear or pale yellow.  Drink small amounts frequently if you have nausea and vomiting.  Eat as you normally do.  Avoid:  Foods or drinks high in sugar.  Carbonated drinks.  Juice.  Extremely hot or cold fluids.  Drinks with caffeine.  Fatty, greasy foods.  Alcohol.  Tobacco.  Overeating.  Gelatin desserts.  Wash your hands well to avoid spreading bacteria and viruses.  Only take over-the-counter or prescription medicines for pain, discomfort, or fever as directed by your caregiver.  Ask your caregiver if you should continue all prescribed and over-the-counter medicines.  Keep all follow-up appointments with your caregiver. SEEK MEDICAL CARE IF:  You have abdominal pain and it increases or stays in one area (localizes).  You have a rash, stiff neck, or  severe headache.  You are irritable, sleepy, or difficult to awaken.  You are weak, dizzy, or extremely thirsty. SEEK IMMEDIATE MEDICAL CARE IF:   You are unable to keep fluids down or you get worse despite treatment.  You have frequent episodes of vomiting or diarrhea.  You have blood or green matter (bile) in your vomit.  You have blood in your stool or your stool looks black and tarry.  You have not urinated in 6 to 8 hours, or you have only urinated a small amount of very dark urine.  You have a fever.  You faint. MAKE SURE YOU:   Understand these instructions.  Will watch your condition.  Will get help right away if you are not doing well or get worse. Document Released: 05/01/2005 Document Revised: 07/24/2011 Document Reviewed: 12/19/2010 [facility] Patient Information 2015 [place], [place]. This information is not intended to replace advice given to you by your health care provider. Make sure you discuss any questions you have with your health care provider.

## 2014-08-03 NOTE — Telephone Encounter (Signed)
Patient called to say she was seen in the ED over the weekend and diagnosed with C. Diff.  She will not keep her scheduled appointments this morning.  Please reschedule as appropriate.

## 2014-08-04 ENCOUNTER — Telehealth: Payer: Self-pay | Admitting: *Deleted

## 2014-08-04 ENCOUNTER — Telehealth: Payer: Self-pay | Admitting: Hematology and Oncology

## 2014-08-04 DIAGNOSIS — C50411 Malignant neoplasm of upper-outer quadrant of right female breast: Secondary | ICD-10-CM | POA: Diagnosis not present

## 2014-08-04 LAB — TYPE AND SCREEN
ABO/RH(D): A POS
Antibody Screen: NEGATIVE
Unit division: 0

## 2014-08-04 MED ORDER — SODIUM CHLORIDE 0.9 % IJ SOLN
10.0000 mL | INTRAMUSCULAR | Status: AC | PRN
  Administered 2014-08-04: 10 mL

## 2014-08-04 MED ORDER — SODIUM CHLORIDE 0.9 % IV SOLN
INTRAVENOUS | Status: AC
  Administered 2014-08-04: 14:00:00 via INTRAVENOUS
  Filled 2014-08-04: qty 500

## 2014-08-04 MED ORDER — HEPARIN SOD (PORK) LOCK FLUSH 100 UNIT/ML IV SOLN
500.0000 [IU] | Freq: Every day | INTRAVENOUS | Status: AC | PRN
  Administered 2014-08-04: 500 [IU]
  Filled 2014-08-04: qty 5

## 2014-08-04 NOTE — Procedures (Signed)
[facility]  Procedure Note  Cheyenne Riggs VPX:[id number] DOB: [birth year] DOA: 08/04/2014  Dr. Lindi Adie  Associated Diagnosis: C50.411  Procedure Note: port accessed, IV fluids infused per order, port flushed and de-accessed   Condition During Procedure:  Patient stable, denies any discomfort   Condition at Discharge:  Patient stable, taken downstairs via wheelchair to wait for transportation   [name], [facility]

## 2014-08-04 NOTE — Telephone Encounter (Signed)
Left patient a message to see how she is doing. To call us if she has any problems or questions

## 2014-08-04 NOTE — Telephone Encounter (Signed)
Called patient and she is aware of her ivf on 3/25

## 2014-08-05 ENCOUNTER — Ambulatory Visit (HOSPITAL_COMMUNITY)
Admission: RE | Admit: 2014-08-05 | Discharge: 2014-08-05 | Disposition: A | Discharge status: Home / Self Care | Payer: Medicare Other | Source: Ambulatory Visit | Attending: Hematology and Oncology | Admitting: Hematology and Oncology

## 2014-08-05 ENCOUNTER — Telehealth: Payer: Self-pay

## 2014-08-05 DIAGNOSIS — C50411 Malignant neoplasm of upper-outer quadrant of right female breast: Secondary | ICD-10-CM | POA: Insufficient documentation

## 2014-08-05 DIAGNOSIS — M6281 Muscle weakness (generalized): Secondary | ICD-10-CM | POA: Diagnosis not present

## 2014-08-05 DIAGNOSIS — D630 Anemia in neoplastic disease: Secondary | ICD-10-CM | POA: Diagnosis not present

## 2014-08-05 DIAGNOSIS — A047 Enterocolitis due to Clostridium difficile: Secondary | ICD-10-CM | POA: Diagnosis not present

## 2014-08-05 DIAGNOSIS — F329 Major depressive disorder, single episode, unspecified: Secondary | ICD-10-CM | POA: Diagnosis not present

## 2014-08-05 DIAGNOSIS — I10 Essential (primary) hypertension: Secondary | ICD-10-CM | POA: Diagnosis not present

## 2014-08-05 MED ORDER — HEPARIN SOD (PORK) LOCK FLUSH 100 UNIT/ML IV SOLN
500.0000 [IU] | INTRAVENOUS | Status: AC | PRN
  Administered 2014-08-05: 500 [IU]
  Filled 2014-08-05 (×2): qty 5

## 2014-08-05 MED ORDER — SODIUM CHLORIDE 0.9 % IJ SOLN
10.0000 mL | INTRAMUSCULAR | Status: AC | PRN
  Administered 2014-08-05: 10 mL

## 2014-08-05 MED ORDER — SODIUM CHLORIDE 0.9 % IV SOLN
INTRAVENOUS | Status: AC
  Administered 2014-08-05: 14:00:00 via INTRAVENOUS
  Filled 2014-08-05: qty 500

## 2014-08-05 MED ORDER — SODIUM CHLORIDE 0.9 % IV SOLN
INTRAVENOUS | Status: DC
  Filled 2014-08-05 (×7): qty 500

## 2014-08-05 NOTE — Procedures (Signed)
[facility]  Procedure Note  TOLUWANIMI RADEBAUGH ZDG:[id number] DOB: 12-24-1945 DOA: 08/05/2014   PCP: Gennette Pac, MD   Associated Diagnosis: Breast cancer of upper-outer quadrant of right female breast  Procedure Note: Pt received IV infusion bolus through Rt chest portacath.    Condition During Procedure: pt tolerated well, no signs of infusion reaction, no signs of infection at [place] site. Redness noted to port site due to allergic reaction to tegaderm, per pt. Opsite used for covering of dressing this visit. Pt tolerated well.   Condition at Discharge: Pt A&Ox4, pt denies pain, pt in no distress. Pt assisted to front lobby via wheelchair. Pt denies any further needs at time of departure.  Trixie Rude, RN  [facility]

## 2014-08-05 NOTE — Telephone Encounter (Signed)
Msg rcvd from [place]- physical therapist with [facility] [phone].  They were going to dc her but have decided to continue PT due to continuing decline.  They will be sending paperwork.  We can call him with any questions.

## 2014-08-06 ENCOUNTER — Ambulatory Visit (HOSPITAL_COMMUNITY)
Admission: RE | Admit: 2014-08-06 | Discharge: 2014-08-06 | Disposition: A | Discharge status: Home / Self Care | Payer: Medicare Other | Source: Ambulatory Visit | Attending: Hematology and Oncology | Admitting: Hematology and Oncology

## 2014-08-06 DIAGNOSIS — D630 Anemia in neoplastic disease: Secondary | ICD-10-CM | POA: Diagnosis not present

## 2014-08-06 DIAGNOSIS — C50411 Malignant neoplasm of upper-outer quadrant of right female breast: Secondary | ICD-10-CM

## 2014-08-06 DIAGNOSIS — M6281 Muscle weakness (generalized): Secondary | ICD-10-CM | POA: Diagnosis not present

## 2014-08-06 DIAGNOSIS — A047 Enterocolitis due to Clostridium difficile: Secondary | ICD-10-CM | POA: Diagnosis not present

## 2014-08-06 MED ORDER — SODIUM CHLORIDE 0.9 % IJ SOLN
10.0000 mL | INTRAMUSCULAR | Status: AC | PRN
  Administered 2014-08-06: 10 mL

## 2014-08-06 MED ORDER — SODIUM CHLORIDE 0.9 % IV SOLN
Freq: Once | INTRAVENOUS | Status: AC
  Administered 2014-08-06: 14:00:00 via INTRAVENOUS
  Filled 2014-08-06: qty 500

## 2014-08-06 MED ORDER — HEPARIN SOD (PORK) LOCK FLUSH 100 UNIT/ML IV SOLN
500.0000 [IU] | INTRAVENOUS | Status: AC | PRN
  Administered 2014-08-06: 500 [IU]
  Filled 2014-08-06: qty 5

## 2014-08-06 MED ORDER — SODIUM CHLORIDE 0.9 % IV SOLN
INTRAVENOUS | Status: DC
  Filled 2014-08-06: qty 500

## 2014-08-06 NOTE — Progress Notes (Signed)
Pt of Dr.Gudena with diagnosis of Breast cancer [facility] for potassium 61mEq infusion. Port-a-cath was accessed, good blood return was noted. At the end of the infusion pt pulled on the IV tubing, that required deaccessing and reaccessing the port; please see the note at 1500; Tolerated procedure well. Upon discharge pt reported that burning at the site went away and she feels no pain. Pt discharged to home. Pt was taken to the lobby area in the wheelchair for family to pick up. Josejulian Tarango, [facility]

## 2014-08-06 NOTE — Progress Notes (Signed)
Upon assessment of the port-a-cath site some redness noted where the dressing usually is applied; no tenderness, itchiness or excoriation noted or reported. Cheyenne Riggs, Cheyenne Riggs

## 2014-08-06 NOTE — Progress Notes (Signed)
Pt called the call bell and reported that IV tubing was accidentally pulled and now the area of port-a-cath access is hurting. Needle was still in the patient. IV tubing was disconnected and port was assessed, there was no blood return noted and port was deaccessed. Slight edema was noted at site. Dr.Gudena and pharmacist were notified. Pharmacist states that no additional actions needed, if continues to be sensitive, compress can be applied. Pt is educated about it and verbalizes understanding.  Port-a-cath reaccessed to be flushed with NS and heparin flush; good blood return noted. WNL per patient. Dr.Gudena notified of that and the pharmacist recommendations. Analeigh Aries, [facility]

## 2014-08-07 ENCOUNTER — Ambulatory Visit (HOSPITAL_BASED_OUTPATIENT_CLINIC_OR_DEPARTMENT_OTHER): Payer: Medicare Other

## 2014-08-07 ENCOUNTER — Ambulatory Visit (HOSPITAL_BASED_OUTPATIENT_CLINIC_OR_DEPARTMENT_OTHER): Payer: Medicare Other | Admitting: Nurse Practitioner

## 2014-08-07 DIAGNOSIS — E860 Dehydration: Secondary | ICD-10-CM

## 2014-08-07 DIAGNOSIS — R112 Nausea with vomiting, unspecified: Secondary | ICD-10-CM | POA: Diagnosis not present

## 2014-08-07 DIAGNOSIS — R197 Diarrhea, unspecified: Secondary | ICD-10-CM

## 2014-08-07 DIAGNOSIS — R0602 Shortness of breath: Secondary | ICD-10-CM | POA: Diagnosis not present

## 2014-08-07 DIAGNOSIS — C50411 Malignant neoplasm of upper-outer quadrant of right female breast: Secondary | ICD-10-CM

## 2014-08-07 NOTE — Patient Instructions (Signed)
Instructions given per Granville Lewis,

## 2014-08-07 NOTE — Progress Notes (Signed)
Pt states difficulty breathing upon movement,  VSS, pulse ox  On Room air is 99%. Cheyenne Pen, NP  To asses pt.   Pt's fluids held today per [employer] orders. Pt  D/C home, instructions given by Granville Lewis.

## 2014-08-09 ENCOUNTER — Other Ambulatory Visit: Payer: Self-pay

## 2014-08-09 ENCOUNTER — Encounter (HOSPITAL_COMMUNITY): Payer: Self-pay | Admitting: Emergency Medicine

## 2014-08-09 ENCOUNTER — Observation Stay (HOSPITAL_COMMUNITY)
Admission: EM | Admit: 2014-08-09 | Discharge: 2014-08-10 | Disposition: A | Discharge status: Home / Self Care | Payer: Medicare Other | Attending: Internal Medicine | Admitting: Internal Medicine

## 2014-08-09 ENCOUNTER — Other Ambulatory Visit (HOSPITAL_COMMUNITY): Payer: Self-pay

## 2014-08-09 DIAGNOSIS — Z882 Allergy status to sulfonamides status: Secondary | ICD-10-CM | POA: Diagnosis not present

## 2014-08-09 DIAGNOSIS — Z881 Allergy status to other antibiotic agents status: Secondary | ICD-10-CM | POA: Diagnosis not present

## 2014-08-09 DIAGNOSIS — R112 Nausea with vomiting, unspecified: Secondary | ICD-10-CM | POA: Diagnosis not present

## 2014-08-09 DIAGNOSIS — E860 Dehydration: Secondary | ICD-10-CM | POA: Diagnosis present

## 2014-08-09 DIAGNOSIS — Z87891 Personal history of nicotine dependence: Secondary | ICD-10-CM | POA: Diagnosis not present

## 2014-08-09 DIAGNOSIS — I249 Acute ischemic heart disease, unspecified: Secondary | ICD-10-CM | POA: Insufficient documentation

## 2014-08-09 DIAGNOSIS — C50411 Malignant neoplasm of upper-outer quadrant of right female breast: Secondary | ICD-10-CM | POA: Insufficient documentation

## 2014-08-09 DIAGNOSIS — Z823 Family history of stroke: Secondary | ICD-10-CM | POA: Diagnosis not present

## 2014-08-09 DIAGNOSIS — I10 Essential (primary) hypertension: Secondary | ICD-10-CM | POA: Insufficient documentation

## 2014-08-09 DIAGNOSIS — F329 Major depressive disorder, single episode, unspecified: Secondary | ICD-10-CM | POA: Insufficient documentation

## 2014-08-09 DIAGNOSIS — E876 Hypokalemia: Secondary | ICD-10-CM | POA: Diagnosis present

## 2014-08-09 DIAGNOSIS — A047 Enterocolitis due to Clostridium difficile: Secondary | ICD-10-CM | POA: Diagnosis not present

## 2014-08-09 DIAGNOSIS — Z8249 Family history of ischemic heart disease and other diseases of the circulatory system: Secondary | ICD-10-CM | POA: Diagnosis not present

## 2014-08-09 DIAGNOSIS — Z90710 Acquired absence of both cervix and uterus: Secondary | ICD-10-CM | POA: Diagnosis not present

## 2014-08-09 DIAGNOSIS — F419 Anxiety disorder, unspecified: Secondary | ICD-10-CM | POA: Diagnosis not present

## 2014-08-09 DIAGNOSIS — R778 Other specified abnormalities of plasma proteins: Secondary | ICD-10-CM | POA: Diagnosis present

## 2014-08-09 DIAGNOSIS — E780 Pure hypercholesterolemia: Secondary | ICD-10-CM | POA: Diagnosis not present

## 2014-08-09 DIAGNOSIS — R9431 Abnormal electrocardiogram [ECG] [EKG]: Secondary | ICD-10-CM | POA: Diagnosis not present

## 2014-08-09 DIAGNOSIS — I5032 Chronic diastolic (congestive) heart failure: Secondary | ICD-10-CM | POA: Diagnosis not present

## 2014-08-09 DIAGNOSIS — Z885 Allergy status to narcotic agent status: Secondary | ICD-10-CM | POA: Insufficient documentation

## 2014-08-09 DIAGNOSIS — R7989 Other specified abnormal findings of blood chemistry: Secondary | ICD-10-CM

## 2014-08-09 DIAGNOSIS — A0472 Enterocolitis due to Clostridium difficile, not specified as recurrent: Secondary | ICD-10-CM | POA: Diagnosis present

## 2014-08-09 LAB — URINALYSIS, ROUTINE W REFLEX MICROSCOPIC
Bilirubin Urine: NEGATIVE
Glucose, UA: NEGATIVE mg/dL
Hgb urine dipstick: NEGATIVE
Ketones, ur: NEGATIVE mg/dL
Leukocytes, UA: NEGATIVE
Nitrite: NEGATIVE
Protein, ur: NEGATIVE mg/dL
Specific Gravity, Urine: 1.005 (ref 1.005–1.030)
Urobilinogen, UA: 0.2 mg/dL (ref 0.0–1.0)
pH: 6.5 (ref 5.0–8.0)

## 2014-08-09 LAB — CBC WITH DIFFERENTIAL/PLATELET
Basophils Absolute: 0 10*3/uL (ref 0.0–0.1)
Basophils Relative: 0 % (ref 0–1)
Eosinophils Absolute: 0 10*3/uL (ref 0.0–0.7)
Eosinophils Relative: 0 % (ref 0–5)
HCT: 35.7 % — ABNORMAL LOW (ref 36.0–46.0)
Hemoglobin: 11.4 g/dL — ABNORMAL LOW (ref 12.0–15.0)
Lymphocytes Relative: 21 % (ref 12–46)
Lymphs Abs: 0.9 10*3/uL (ref 0.7–4.0)
MCH: 27.6 pg (ref 26.0–34.0)
MCHC: 31.9 g/dL (ref 30.0–36.0)
MCV: 86.4 fL (ref 78.0–100.0)
Monocytes Absolute: 0.8 10*3/uL (ref 0.1–1.0)
Monocytes Relative: 18 % — ABNORMAL HIGH (ref 3–12)
Neutro Abs: 2.6 10*3/uL (ref 1.7–7.7)
Neutrophils Relative %: 61 % (ref 43–77)
Platelets: 213 10*3/uL (ref 150–400)
RBC: 4.13 MIL/uL (ref 3.87–5.11)
RDW: 18.1 % — ABNORMAL HIGH (ref 11.5–15.5)
WBC: 4.3 10*3/uL (ref 4.0–10.5)

## 2014-08-09 LAB — LIPASE, BLOOD: Lipase: 25 U/L (ref 11–59)

## 2014-08-09 LAB — TROPONIN I
TROPONIN I: 0.71 ng/mL — AB (ref <0.031)
Troponin I: 0.72 ng/mL (ref <0.031)
Troponin I: 0.66 ng/mL (ref <0.031)

## 2014-08-09 LAB — MAGNESIUM: Magnesium: 1.2 mg/dL — ABNORMAL LOW (ref 1.5–2.5)

## 2014-08-09 MED ORDER — VITAMIN D 1000 UNITS PO TABS
1000.0000 [IU] | ORAL_TABLET | Freq: Every day | ORAL | Status: DC
  Administered 2014-08-09 – 2014-08-10 (×2): 1000 [IU] via ORAL
  Filled 2014-08-09 (×2): qty 1

## 2014-08-09 MED ORDER — SODIUM CHLORIDE 0.9 % IJ SOLN: 3.0000 mL | Freq: Two times a day (BID) | INTRAMUSCULAR | Status: DC

## 2014-08-09 MED ORDER — ONDANSETRON HCL 4 MG/2ML IJ SOLN
4.0000 mg | Freq: Once | INTRAMUSCULAR | Status: AC
  Administered 2014-08-09: 4 mg via INTRAVENOUS
  Filled 2014-08-09: qty 2

## 2014-08-09 MED ORDER — POTASSIUM CHLORIDE ER 8 MEQ PO CPCR: 30.0000 meq | ORAL_CAPSULE | Freq: Every day | ORAL | Status: DC

## 2014-08-09 MED ORDER — SODIUM CHLORIDE 0.9 % IJ SOLN: 3.0000 mL | INTRAMUSCULAR | Status: DC | PRN

## 2014-08-09 MED ORDER — NITROGLYCERIN 0.4 MG SL SUBL: 0.4000 mg | SUBLINGUAL_TABLET | SUBLINGUAL | Status: DC | PRN

## 2014-08-09 MED ORDER — POTASSIUM CHLORIDE CRYS ER 20 MEQ PO TBCR
40.0000 meq | EXTENDED_RELEASE_TABLET | Freq: Once | ORAL | Status: AC
  Administered 2014-08-09: 40 meq via ORAL
  Filled 2014-08-09 (×2): qty 2

## 2014-08-09 MED ORDER — ASPIRIN 300 MG RE SUPP: 300.0000 mg | RECTAL | Status: AC

## 2014-08-09 MED ORDER — ASPIRIN 81 MG PO CHEW
324.0000 mg | CHEWABLE_TABLET | Freq: Once | ORAL | Status: AC
  Administered 2014-08-09: 324 mg via ORAL
  Filled 2014-08-09: qty 4

## 2014-08-09 MED ORDER — FLUOXETINE HCL 10 MG PO CAPS
10.0000 mg | ORAL_CAPSULE | Freq: Every day | ORAL | Status: DC
  Administered 2014-08-10: 10 mg via ORAL
  Filled 2014-08-09: qty 1

## 2014-08-09 MED ORDER — ASPIRIN 81 MG PO CHEW: 324.0000 mg | CHEWABLE_TABLET | ORAL | Status: AC

## 2014-08-09 MED ORDER — METRONIDAZOLE 500 MG PO TABS
500.0000 mg | ORAL_TABLET | Freq: Three times a day (TID) | ORAL | Status: DC
  Administered 2014-08-09 – 2014-08-10 (×3): 500 mg via ORAL
  Filled 2014-08-09 (×3): qty 1

## 2014-08-09 MED ORDER — SODIUM CHLORIDE 0.9 % IJ SOLN
10.0000 mL | INTRAMUSCULAR | Status: DC | PRN
  Administered 2014-08-09 – 2014-08-10 (×3): 10 mL
  Filled 2014-08-09 (×3): qty 40

## 2014-08-09 MED ORDER — LOPERAMIDE HCL 2 MG PO CAPS: 2.0000 mg | ORAL_CAPSULE | Freq: Every day | ORAL | Status: DC | PRN

## 2014-08-09 MED ORDER — SODIUM CHLORIDE 0.9 % IV BOLUS (SEPSIS)
2000.0000 mL | Freq: Once | INTRAVENOUS | Status: AC
  Administered 2014-08-09: 2000 mL via INTRAVENOUS

## 2014-08-09 MED ORDER — ACETAMINOPHEN 325 MG PO TABS: 650.0000 mg | ORAL_TABLET | Freq: Four times a day (QID) | ORAL | Status: DC | PRN

## 2014-08-09 MED ORDER — SODIUM CHLORIDE 0.9 % IV SOLN
INTRAVENOUS | Status: AC
  Filled 2014-08-09: qty 500

## 2014-08-09 MED ORDER — ONDANSETRON 8 MG/NS 50 ML IVPB
8.0000 mg | Freq: Four times a day (QID) | INTRAVENOUS | Status: DC | PRN
  Filled 2014-08-09: qty 8

## 2014-08-09 MED ORDER — SODIUM CHLORIDE 0.9 % IV SOLN: 250.0000 mL | INTRAVENOUS | Status: DC | PRN

## 2014-08-09 MED ORDER — ONDANSETRON HCL 4 MG/2ML IJ SOLN
4.0000 mg | Freq: Four times a day (QID) | INTRAMUSCULAR | Status: DC | PRN
  Filled 2014-08-09: qty 2

## 2014-08-09 MED ORDER — POTASSIUM CHLORIDE CRYS ER 10 MEQ PO TBCR: 10.0000 meq | EXTENDED_RELEASE_TABLET | Freq: Every day | ORAL | Status: DC

## 2014-08-09 MED ORDER — ASPIRIN EC 81 MG PO TBEC
81.0000 mg | DELAYED_RELEASE_TABLET | Freq: Every day | ORAL | Status: DC
  Administered 2014-08-10: 81 mg via ORAL
  Filled 2014-08-09: qty 1

## 2014-08-09 MED ORDER — THIAMINE HCL 100 MG/ML IJ SOLN
100.0000 mg | Freq: Once | INTRAMUSCULAR | Status: AC
  Administered 2014-08-09: 100 mg via INTRAVENOUS
  Filled 2014-08-09: qty 2

## 2014-08-09 MED ORDER — MAGNESIUM SULFATE 2 GM/50ML IV SOLN
2.0000 g | Freq: Once | INTRAVENOUS | Status: AC
  Administered 2014-08-09: 2 g via INTRAVENOUS
  Filled 2014-08-09: qty 50

## 2014-08-09 MED ORDER — SODIUM CHLORIDE 0.9 % IV SOLN: INTRAVENOUS | Status: DC

## 2014-08-09 MED ORDER — HEPARIN SODIUM (PORCINE) 5000 UNIT/ML IJ SOLN
5000.0000 [IU] | Freq: Three times a day (TID) | INTRAMUSCULAR | Status: DC
  Administered 2014-08-09 – 2014-08-10 (×2): 5000 [IU] via SUBCUTANEOUS
  Filled 2014-08-09 (×4): qty 1

## 2014-08-09 MED ORDER — POTASSIUM CHLORIDE CRYS ER 20 MEQ PO TBCR
40.0000 meq | EXTENDED_RELEASE_TABLET | Freq: Four times a day (QID) | ORAL | Status: AC
  Administered 2014-08-09: 40 meq via ORAL

## 2014-08-09 MED ORDER — LORAZEPAM 0.5 MG PO TABS
0.5000 mg | ORAL_TABLET | Freq: Three times a day (TID) | ORAL | Status: DC | PRN
  Administered 2014-08-09 – 2014-08-10 (×2): 0.5 mg via ORAL
  Filled 2014-08-09 (×2): qty 1

## 2014-08-09 MED ORDER — ACETAMINOPHEN 325 MG PO TABS: 650.0000 mg | ORAL_TABLET | ORAL | Status: DC | PRN

## 2014-08-09 NOTE — Consult Note (Addendum)
CONSULTATION NOTE  Reason for Consult: Elevated troponin  Requesting Physician: Dr. Butler Denmark  Cardiologist: Dr. Elease Hashimoto  HPI: This is a 69 y.o. female with a past medical history significant for HTN, history of breast cancer on chemotherapy last chemotherapy received was today presents to the ER because of shortness of breath. She was seen in February of 2016 by Dr. Elease Hashimoto for dyspnea the day prior to admission. CT angio showed no pulmonary embolus. She did have bilateral pleural effusions. An echo was performed that showed an LVEF of 55-60% with mild AI and MR. Global strain parameters were low normal.    She has been receiving chemotherapy and has been chronically nauseated. She was recently found to have C. Difficile colitis and has been having vomiting and diarrhea for a couple of weeks. Recently received IV fluids at the cancer center and was thought to be volume overloaded. Workup in the ER demonstrated an elevated troponin of unknown etiology at 0.72. Cardiology is asked to evaluate regarding her troponin elevation.  PMHx:   Past Medical History  Diagnosis Date  . Anxiety   . Back pain   . H/O bladder problems   . Cholelithiasis   . Depression   . High blood pressure   . Hypercholesteremia   . Kidney stones   . Gum disease   . Complication of anesthesia     bp goes up  . Wears glasses   . Wears dentures     top   Past Surgical History  Procedure Laterality Date  . Colon biopsy    . Mouth surgery    . Spine surgery    . Colonoscopy    . Tubal ligation    . Lithotripsy    . Rhinoplasty    . Back surgery  1989    lumb lam  . Breast biopsy Right 02/05/2014    invasive ductal cncer  . Abdominal hysterectomy  1982  . Cholecystectomy  2011    lap choli  . Radioactive seed guided mastectomy with axillary sentinel lymph node biopsy Right 02/27/2014    Procedure: RADIOACTIVE SEED GUIDED RIGHT PARTIAL MASTECTOMY WITH AXILLARY SENTINEL LYMPH NODE BIOPSY;  Surgeon:  Almond Lint, MD;  Location: [facility];  Service: General;  Laterality: Right;  . Portacath placement N/A 02/27/2014    Procedure: INSERTION PORT-A-CATH;  Surgeon: Almond Lint, MD;  Location: [facility];  Service: General;  Laterality: N/A;    FAMHx: Family History  Problem Relation Age of Onset  . Stroke Mother     onset in her 73's  . Cancer - Other Father     eye cancer, black lung diseae  . Heart failure Sister   . Hypertension Brother      SOCHx:  reports that she quit smoking about 2 months ago. She does not have any smokeless tobacco history on file. She reports that she does not drink alcohol or use illicit drugs.  ALLERGIES: Allergies  Allergen Reactions  . Ciprofloxacin Nausea And Vomiting  . Demerol [Meperidine] Hives  . Lidocaine     palpatations  . Other Swelling    Eye ointments  . Septra [Sulfamethoxazole-Trimethoprim] Nausea And Vomiting  . Codeine Rash  . Novocain [Procaine] Palpitations    ROS: A comprehensive review of systems was negative except for: Constitutional: positive for anorexia and fatigue Gastrointestinal: positive for diarrhea, nausea, reflux symptoms and vomiting  HOME MEDICATIONS: Prescriptions prior to admission  Medication Sig Dispense Refill Last Dose  .  acetaminophen (TYLENOL) 325 MG tablet Take 650 mg by mouth every 6 (six) hours as needed for moderate pain (pain).    1 week at Unknown time  . diphenhydrAMINE (BENADRYL) 25 MG tablet Take 25 mg by mouth daily as needed for allergies (allergies).    Past Week at Unknown time  . FLUoxetine (PROZAC) 10 MG capsule Take 1 capsule (10 mg total) by mouth daily. 30 capsule 1 08/09/2014 at Unknown time  . furosemide (LASIX) 40 MG tablet Take 1 tablet (40 mg total) by mouth daily as needed for fluid (Take a lasix 40 mg and Kdur 20 meq if your weight goes up 5 lbs). 30 tablet 0 08/07/2014 at Unknown time  . lidocaine-prilocaine (EMLA) cream Apply 1 application  topically once a week. On Chemo Day (Monday).  0 08/07/2014  . loperamide (IMODIUM) 2 MG capsule Take 2 mg by mouth daily as needed for diarrhea or loose stools.   Past Week at Unknown time  . LORazepam (ATIVAN) 0.5 MG tablet Take 1 tablet (0.5 mg total) by mouth every 8 (eight) hours as needed. Nausea 30 tablet 0 08/08/2014 at Unknown time  . metroNIDAZOLE (FLAGYL) 500 MG tablet Take 1 tablet (500 mg total) by mouth 3 (three) times daily. One po bid x 15 days 45 tablet 0 08/09/2014 at Unknown time  . ondansetron (ZOFRAN) 8 MG tablet Take 8 mg by mouth daily as needed for nausea (nausea).   1 08/09/2014 at Unknown time  . potassium chloride (MICRO-K) 10 MEQ CR capsule Take 3 capsules (30 mEq total) by mouth daily. 60 capsule 0 08/09/2014 at Unknown time  . Probiotic Product (PROBIOTIC DAILY PO) Take 1 tablet by mouth daily.    08/09/2014 at Unknown time  . cholecalciferol (VITAMIN D) 1000 UNITS tablet Take 1,000 Units by mouth daily.    4-5 days    HOSPITAL MEDICATIONS: Prior to Admission:  Prescriptions prior to admission  Medication Sig Dispense Refill Last Dose  . acetaminophen (TYLENOL) 325 MG tablet Take 650 mg by mouth every 6 (six) hours as needed for moderate pain (pain).    1 week at Unknown time  . diphenhydrAMINE (BENADRYL) 25 MG tablet Take 25 mg by mouth daily as needed for allergies (allergies).    Past Week at Unknown time  . FLUoxetine (PROZAC) 10 MG capsule Take 1 capsule (10 mg total) by mouth daily. 30 capsule 1 08/09/2014 at Unknown time  . furosemide (LASIX) 40 MG tablet Take 1 tablet (40 mg total) by mouth daily as needed for fluid (Take a lasix 40 mg and Kdur 20 meq if your weight goes up 5 lbs). 30 tablet 0 08/07/2014 at Unknown time  . lidocaine-prilocaine (EMLA) cream Apply 1 application topically once a week. On Chemo Day (Monday).  0 08/07/2014  . loperamide (IMODIUM) 2 MG capsule Take 2 mg by mouth daily as needed for diarrhea or loose stools.   Past Week at Unknown time  .  LORazepam (ATIVAN) 0.5 MG tablet Take 1 tablet (0.5 mg total) by mouth every 8 (eight) hours as needed. Nausea 30 tablet 0 08/08/2014 at Unknown time  . metroNIDAZOLE (FLAGYL) 500 MG tablet Take 1 tablet (500 mg total) by mouth 3 (three) times daily. One po bid x 15 days 45 tablet 0 08/09/2014 at Unknown time  . ondansetron (ZOFRAN) 8 MG tablet Take 8 mg by mouth daily as needed for nausea (nausea).   1 08/09/2014 at Unknown time  . potassium chloride (MICRO-K) 10 MEQ CR  capsule Take 3 capsules (30 mEq total) by mouth daily. 60 capsule 0 08/09/2014 at Unknown time  . Probiotic Product (PROBIOTIC DAILY PO) Take 1 tablet by mouth daily.    08/09/2014 at Unknown time  . cholecalciferol (VITAMIN D) 1000 UNITS tablet Take 1,000 Units by mouth daily.    4-5 days    VITALS: Blood pressure 121/82, pulse 91, temperature 98 F (36.7 C), temperature source Oral, resp. rate 18, height 5\' 3"  (1.6 m), weight 175 lb 1.6 oz (79.425 kg), SpO2 95 %.  PHYSICAL EXAM: General appearance: alert and no distress Neck: no carotid bruit and no JVD Lungs: clear to auscultation bilaterally Heart: regular rate and rhythm, S1, S2 normal, no murmur, click, rub or gallop Abdomen: soft, non-tender; bowel sounds normal; no masses,  no organomegaly Extremities: extremities normal, atraumatic, no cyanosis or edema Pulses: 2+ and symmetric Skin: Skin color, texture, turgor normal. No rashes or lesions Neurologic: Grossly normal Psych: Pleasant  LABS: Results for orders placed or performed during the hospital encounter of 08/09/14 (from the past 48 hour(s))  CBC with Differential     Status: Abnormal   Collection Time: 08/09/14  2:20 PM  Result Value Ref Range   WBC 4.3 4.0 - 10.5 K/uL   RBC 4.13 3.87 - 5.11 MIL/uL   Hemoglobin 11.4 (L) 12.0 - 15.0 g/dL   HCT 08/11/14 (L) 57.8 - 73.9 %   MCV 86.4 78.0 - 100.0 fL   MCH 27.6 26.0 - 34.0 pg   MCHC 31.9 30.0 - 36.0 g/dL   RDW 47.6 (H) 93.4 - 62.5 %   Platelets 213 150 - 400 K/uL     Neutrophils Relative % 61 43 - 77 %   Neutro Abs 2.6 1.7 - 7.7 K/uL   Lymphocytes Relative 21 12 - 46 %   Lymphs Abs 0.9 0.7 - 4.0 K/uL   Monocytes Relative 18 (H) 3 - 12 %   Monocytes Absolute 0.8 0.1 - 1.0 K/uL   Eosinophils Relative 0 0 - 5 %   Eosinophils Absolute 0.0 0.0 - 0.7 K/uL   Basophils Relative 0 0 - 1 %   Basophils Absolute 0.0 0.0 - 0.1 K/uL  Comprehensive metabolic panel     Status: Abnormal   Collection Time: 08/09/14  2:20 PM  Result Value Ref Range   Sodium 144 135 - 145 mmol/L   Potassium 2.6 (LL) 3.5 - 5.1 mmol/L    Comment: CRITICAL RESULT CALLED TO, READ BACK BY AND VERIFIED WITH: HAMILTON,J AT 5:50PM ON 08/09/14 BY FESTERMAN,C    Chloride 108 96 - 112 mmol/L   CO2 25 19 - 32 mmol/L   Glucose, Bld 125 (H) 70 - 99 mg/dL   BUN <5 (L) 6 - 23 mg/dL   Creatinine, Ser 08/11/14 (H) 0.50 - 1.10 mg/dL   Calcium 8.5 8.4 - 3.37 mg/dL   Total Protein 5.6 (L) 6.0 - 8.3 g/dL   Albumin 3.3 (L) 3.5 - 5.2 g/dL   AST 23 0 - 37 U/L   ALT 10 0 - 35 U/L   Alkaline Phosphatase 69 39 - 117 U/L   Total Bilirubin 0.8 0.3 - 1.2 mg/dL   GFR calc non Af Amer 50 (L) >90 mL/min   GFR calc Af Amer 58 (L) >90 mL/min    Comment: (NOTE) The eGFR has been calculated using the CKD EPI equation. This calculation has not been validated in all clinical situations. eGFR's persistently <90 mL/min signify possible Chronic Kidney Disease.  Anion gap 11 5 - 15  Lipase, blood     Status: None   Collection Time: 08/09/14  2:20 PM  Result Value Ref Range   Lipase 25 11 - 59 U/L  Troponin I     Status: Abnormal   Collection Time: 08/09/14  2:25 PM  Result Value Ref Range   Troponin I 0.72 (HH) <0.031 ng/mL    Comment: CRITICAL RESULT CALLED TO, READ BACK BY AND VERIFIED WITH: DAVIS,N AT 3:40PM ON 08/09/14 BY FESTERMAN,C        POSSIBLE MYOCARDIAL ISCHEMIA. SERIAL TESTING RECOMMENDED.   Magnesium     Status: Abnormal   Collection Time: 08/09/14  2:25 PM  Result Value Ref Range    Magnesium 1.2 (L) 1.5 - 2.5 mg/dL  Urinalysis, Routine w reflex microscopic     Status: None   Collection Time: 08/09/14  3:00 PM  Result Value Ref Range   Color, Urine YELLOW YELLOW   APPearance CLEAR CLEAR   Specific Gravity, Urine 1.005 1.005 - 1.030   pH 6.5 5.0 - 8.0   Glucose, UA NEGATIVE NEGATIVE mg/dL   Hgb urine dipstick NEGATIVE NEGATIVE   Bilirubin Urine NEGATIVE NEGATIVE   Ketones, ur NEGATIVE NEGATIVE mg/dL   Protein, ur NEGATIVE NEGATIVE mg/dL   Urobilinogen, UA 0.2 0.0 - 1.0 mg/dL   Nitrite NEGATIVE NEGATIVE   Leukocytes, UA NEGATIVE NEGATIVE    Comment: MICROSCOPIC NOT DONE ON URINES WITH NEGATIVE PROTEIN, BLOOD, LEUKOCYTES, NITRITE, OR GLUCOSE <1000 mg/dL.  Troponin I-(serum)     Status: Abnormal   Collection Time: 08/09/14  5:15 PM  Result Value Ref Range   Troponin I 0.71 (HH) <0.031 ng/mL    Comment: CRITICAL VALUE NOTED.  VALUE IS CONSISTENT WITH PREVIOUSLY REPORTED AND CALLED VALUE.        POSSIBLE MYOCARDIAL ISCHEMIA. SERIAL TESTING RECOMMENDED.     IMAGING: No results found.  HOSPITAL DIAGNOSES: Principal Problem:   Dehydration Active Problems:   Breast cancer of upper-outer quadrant of right female breast   Enteritis due to Clostridium difficile   Nausea with vomiting   Hypokalemia   Elevated troponin   Hypomagnesemia   IMPRESSION: 1. Elevated troponin 2. Diastolic dysfunction with possible history diastolic heart failure  RECOMMENDATION: 1. Mrs. Domanski has an elevated troponin of unknown significance. It may be related to GI infection and/or nausea and vomiting/chemotherapy. Her echocardiogram 1 month ago was reassuring showing normal systolic function with no wall motion abnormalities. She probably has some diastolic dysfunction and has had a couple episodes of shortness of breath related to volume overload. She's relates that she's had 2 L of normal saline since admission and I would be careful not to volume overload her. She has required  Lasix in the past with improvement in her breathing. I would recommend trending her troponins and if they show a typical rise and fall for acute coronary syndrome, I would then recommend starting anticoagulation such as heparin. She has had no chest pain or symptoms concerning for angina. I suspect that her troponin numbers may remain flat or decrease further. Based on troponins it may be reasonable to repeat an echocardiogram or once she has recovered from her nausea, vomiting and diarrhea, consider stress testing.  Cardiology will follow along with you. Thank you for consulting [place].  Time Spent Directly with Patient: 30 minutes  Pixie Casino, MD, [facility] Attending Cardiologist [facility]  HILTY,Kenneth C 08/09/2014, 6:03 PM

## 2014-08-09 NOTE — ED Notes (Signed)
MD at bedside. 

## 2014-08-09 NOTE — Progress Notes (Signed)
Paged infection prevention to see if patient needs to be on contact although they have not had loose stools in past two days. Last BM was three days and it was formed.

## 2014-08-09 NOTE — H&P (Addendum)
[facility] History and Physical  Cheyenne Riggs ZOX:[id number] DOB: [birth year] DOA: 08/09/2014   PCP: Gennette Pac, MD    Chief Complaint: vomiting  HPI: Cheyenne Riggs is a 69 y.o. female with breast cancer, receiving chemo, chronic nausea, recent c diff colitis who comes in for vomiting large amounts liquids x 2 today. She has been on a liquid diet for 2 wks. She was diagnosed with C diff colitis a little over a week ago. She states that her cancer doctor is aware that she is only on liquids. She received IVF at the cancer center 3 x last week on consecutive days then followed by a day of Lasix as she was thought to have gotten fluid overloaded- her shortness of breath did go away with Lasix.  She was given 1 L of fluid in the ER. K and Mg were low and replaced. She is on daily K replacements. She was found to have an elevated troponin and is being referred for admission. She had an elevated TNI on 2/17 during her admission for dehydration. An ECHO was done which showed a normal EF, she was diuresed and asked to f/u with Dr  Cathie Olden as an outpt. She has never had vomiting with her Chemo and now there is concern that she may have acute coronary syndrome.   General: + anorexia, 25 weight loss since last October, no fever Cardiac: Denies chest pain, syncope, palpitations, pedal edema  Respiratory: Denies, shortness of breath, wheezing- has allergies and has been coughing up some mucous GI: Denies severe indigestion/heartburn, abdominal pain, nausea, vomiting, diarrhea and constipation- has had a small amount of stool in past 2 days but no longer having diarrhea (c diff) GU: Denies hematuria, dysuria + incontinence  Due to over active bladder was doing physical therapy for it prior to chemo Musculoskeletal: Denies arthritis  Skin: has had a rash for 2 wks on her arms- applying cortisone to it Neurologic: Denies focal weakness or numbness, change in vision- chronic left foot  numbness Psychiatry: + mild - mod depression or anxiety. Hematologic: + bruising- no bleeding   Past Medical History  Diagnosis Date  . Anxiety   . Back pain   . H/O bladder problems   . Cholelithiasis   . Depression   . High blood pressure   . Hypercholesteremia   . Kidney stones   . Gum disease   . Complication of anesthesia     bp goes up  . Wears glasses   . Wears dentures     top    Past Surgical History  Procedure Laterality Date  . Colon biopsy    . Mouth surgery    . Spine surgery    . Colonoscopy    . Tubal ligation    . Lithotripsy    . Rhinoplasty    . Back surgery  1989    lumb lam  . Breast biopsy Right 02/05/2014    invasive ductal cncer  . Abdominal hysterectomy  1982  . Cholecystectomy  2011    lap choli  . Radioactive seed guided mastectomy with axillary sentinel lymph node biopsy Right 02/27/2014    Procedure: RADIOACTIVE SEED GUIDED RIGHT PARTIAL MASTECTOMY WITH AXILLARY SENTINEL LYMPH NODE BIOPSY;  Surgeon: Stark Klein, MD;  Location: [facility];  Service: General;  Laterality: Right;  . Portacath placement N/A 02/27/2014    Procedure: INSERTION PORT-A-CATH;  Surgeon: Stark Klein, MD;  Location: [facility];  Service: General;  Laterality:  N/A;    Social History: does not smoke or drink alcohol Lives at home alone with dog    Allergies  Allergen Reactions  . Ciprofloxacin Nausea And Vomiting  . Demerol [Meperidine] Hives  . Lidocaine     palpatations  . Other Swelling    Eye ointments  . Septra [Sulfamethoxazole-Trimethoprim] Nausea And Vomiting  . Codeine Rash  . Novocain [Procaine] Palpitations    Family history - mother had CVA after thyroid surgery-    Prior to Admission medications   Medication Sig Start Date End Date Taking? Authorizing Provider  acetaminophen (TYLENOL) 325 MG tablet Take 650 mg by mouth every 6 (six) hours as needed for moderate pain (pain).    Yes Historical Provider, MD   diphenhydrAMINE (BENADRYL) 25 MG tablet Take 25 mg by mouth daily as needed for allergies (allergies).    Yes Historical Provider, MD  FLUoxetine (PROZAC) 10 MG capsule Take 1 capsule (10 mg total) by mouth daily. 05/26/14  Yes Hosie Poisson, MD  furosemide (LASIX) 40 MG tablet Take 1 tablet (40 mg total) by mouth daily as needed for fluid (Take a lasix 40 mg and Kdur 20 meq if your weight goes up 5 lbs). 07/01/14  Yes Donne Hazel, MD  lidocaine-prilocaine (EMLA) cream Apply 1 application topically once a week. On Chemo Day (Monday). 03/27/14  Yes Historical Provider, MD  loperamide (IMODIUM) 2 MG capsule Take 2 mg by mouth daily as needed for diarrhea or loose stools.   Yes Historical Provider, MD  LORazepam (ATIVAN) 0.5 MG tablet Take 1 tablet (0.5 mg total) by mouth every 8 (eight) hours as needed. Nausea 07/27/14  Yes Nicholas Lose, MD  metroNIDAZOLE (FLAGYL) 500 MG tablet Take 1 tablet (500 mg total) by mouth 3 (three) times daily. One po bid x 15 days 08/01/14  Yes Malvin Johns, MD  ondansetron (ZOFRAN) 8 MG tablet Take 8 mg by mouth daily as needed for nausea (nausea).  03/30/14  Yes Historical Provider, MD  potassium chloride (MICRO-K) 10 MEQ CR capsule Take 3 capsules (30 mEq total) by mouth daily. 08/03/14  Yes Nicholas Lose, MD  Probiotic Product (PROBIOTIC DAILY PO) Take 1 tablet by mouth daily.    Yes Historical Provider, MD  cholecalciferol (VITAMIN D) 1000 UNITS tablet Take 1,000 Units by mouth daily.     Historical Provider, MD     Physical Exam: Filed Vitals:   08/09/14 1430 08/09/14 1515 08/09/14 1540 08/09/14 1713  BP:   131/77 150/63  Pulse: 84 105 85 94  Temp:      TempSrc:      Resp:    18  SpO2: 92% 96% 96% 98%     General: AAO x3, no distress HEENT: Normocephalic and Atraumatic, Mucous membranes pink                PERRLA; EOM intact; No scleral icterus,                 Nares: Patent, Oropharynx: Clear, Fair Dentition                 Neck: FROM, no cervical  lymphadenopathy, thyromegaly, carotid bruit or JVD;  Breasts: deferred CHEST WALL: No tenderness  CHEST: Normal respiration, clear to auscultation bilaterally  HEART: Regular rate and rhythm; no murmurs rubs or gallops  BACK: No kyphosis or scoliosis; no CVA tenderness  GI: Positive Bowel Sounds, soft, non-tender; no masses, no organomegaly Rectal Exam: deferred MSK: No cyanosis, clubbing, or edema Genitalia:  not examined  SKIN:  macular rash on dorsum of both arms CNS: Alert and Oriented x 4, Nonfocal exam, CN 2-12 intact  Labs on Admission:  Basic Metabolic Panel:  Recent Labs Lab 08/03/14 1022 08/09/14 1420 08/09/14 1425  NA 143 144  --   K 3.0* 2.6*  --   CL  --  108  --   CO2 20* 25  --   GLUCOSE 88 125*  --   BUN 3.4* <5*  --   CREATININE 1.0 1.11*  --   CALCIUM 8.8 8.5  --   MG  --   --  1.2*   Liver Function Tests:  Recent Labs Lab 08/03/14 1022 08/09/14 1420  AST 20 23  ALT 10 10  ALKPHOS 69 69  BILITOT 0.53 0.8  PROT 5.4* 5.6*  ALBUMIN 2.9* 3.3*    Recent Labs Lab 08/09/14 1420  LIPASE 25   No results for input(s): AMMONIA in the last 168 hours. CBC:  Recent Labs Lab 08/03/14 1022 08/09/14 1420  WBC 3.8* 4.3  NEUTROABS 2.7 2.6  HGB 8.2* 11.4*  HCT 25.5* 35.7*  MCV 87.6 86.4  PLT 246 213   Cardiac Enzymes:  Recent Labs Lab 08/09/14 1425  TROPONINI 0.72*    BNP (last 3 results)  Recent Labs  06/29/14 1740  BNP 534.8*    ProBNP (last 3 results) No results for input(s): PROBNP in the last 8760 hours.  CBG: No results for input(s): GLUCAP in the last 168 hours.  Radiological Exams on Admission: No results found.  EKG: Independently reviewed. Sinus rhythm at 90 with Qtc of 563  Assessment/Plan Principal Problem:   Acute coronary syndrome - elevated troponin and new onset vomiting - cardiology to be consulted by ER- follow 3 sets of troponin - follow on telemetry  Active Problems:   Nausea with vomiting/  Dehydration - Hydrating - receiving a 2nd liter of fluid ordered by ER- cont Zofran via IV - she suspect the symptoms are from chemo - full liquids for now    Hypokalemia - replacing oral and IV- will need continued close monitoring as outpt- on 10 meq of KCL daily- possibly should be increased to 40 on d/c    Hypomagnesemia - giving 2 gm of Mg now- as mentioned above, will need closer monitoring as outpatient  Prolonged Qtc - due to hypomagnesemia and hypokalemia- follow Qtc on telemetry    Breast cancer of upper-outer quadrant of right female breast - s/p lumpectomy  - ongoing chemo per oncology which she states she will be finishing soon    Enteritis due to Clostridium difficile -cont oral Flagyl    Consulted:   Code Status: full code  Family Communication:   DVT Prophylaxis:Heparin  Time spent: 50 min  [place], MD [facility]  If 7PM-7AM, please contact night-coverage [email or web address] 08/09/2014, 5:16 PM

## 2014-08-09 NOTE — ED Notes (Signed)
Hospitalist at bedside 

## 2014-08-09 NOTE — Progress Notes (Signed)
Patient states she has not had a BM in two days. Last BM was said to be formed. Will keep eye on BM's.. Patient is not currently on Eneteric precautions.   Patient has had several episodes of vomiting at home prior to admission. Patient states that it was green and clear in color. Will continue to monitor.

## 2014-08-09 NOTE — ED Notes (Signed)
MD Jacubowitz at bedside.  

## 2014-08-09 NOTE — ED Notes (Signed)
Pt states that she has been having NV since this morning.  States she gets dehydrated very easily.  Breast CA pt w/ last chemo is 2 wks ago.  Scheduled tomorrow.  No pain.

## 2014-08-09 NOTE — ED Provider Notes (Signed)
CSN: [id number]     Arrival date & time 08/09/14  1332 History   First MD Initiated Contact with Patient 08/09/14 1459     Chief Complaint  Patient presents with  . Chemo Card   . Nausea  . Emesis     (Consider location/radiation/quality/duration/timing/severity/associated sxs/prior Treatment) HPI ComPlains of nausea daily, stating "I have nausea every day"since being treated for breast cancer with chemotherapy. Last chemotherapy was 6 days ago.. she states this morning nausea became worse and she vomited twice. She feels dehydrated. She denies fever denies chest pain. No headache no abdominal pain denies shortness of breath denies other associated symptoms. Treated with Zofran, without relief. She received intravenous fluid at cancer Center twice last week, she was supposed to get additional intravenous fluids. He is ago however she had shortness of breath with exertion 2 days ago so fluids were withheld and she was treated with Lasix 20 mg orally. She denies shortness of breath today. She feels improved after treatment with Zofran here however still complains of no nausea and feels dehydrated. No other associated symptoms. Nothing makes symptoms better or worse Past Medical History  Diagnosis Date  . Anxiety   . Back pain   . H/O bladder problems   . Cholelithiasis   . Depression   . High blood pressure   . Hypercholesteremia   . Kidney stones   . Gum disease   . Complication of anesthesia     bp goes up  . Wears glasses   . Wears dentures     top   Past Surgical History  Procedure Laterality Date  . Colon biopsy    . Mouth surgery    . Spine surgery    . Colonoscopy    . Tubal ligation    . Lithotripsy    . Rhinoplasty    . Back surgery  1989    lumb lam  . Breast biopsy Right 02/05/2014    invasive ductal cncer  . Abdominal hysterectomy  1982  . Cholecystectomy  2011    lap choli  . Radioactive seed guided mastectomy with axillary sentinel lymph node biopsy Right  02/27/2014    Procedure: RADIOACTIVE SEED GUIDED RIGHT PARTIAL MASTECTOMY WITH AXILLARY SENTINEL LYMPH NODE BIOPSY;  Surgeon: Stark Klein, MD;  Location: [facility];  Service: General;  Laterality: Right;  . Portacath placement N/A 02/27/2014    Procedure: INSERTION PORT-A-CATH;  Surgeon: Stark Klein, MD;  Location: [facility];  Service: General;  Laterality: N/A;   No family history on file. History  Substance Use Topics  . Smoking status: Former Smoker -- 0.00 packs/day    Quit date: 05/16/2014  . Smokeless tobacco: Not on file  . Alcohol Use: No   OB History    No data available     Review of Systems  Constitutional: Negative.        Feels dehydrated  HENT: Negative.   Respiratory: Negative.   Cardiovascular: Negative.   Gastrointestinal: Positive for nausea and vomiting.  Musculoskeletal: Negative.   Skin: Negative.   Neurological: Negative.   Psychiatric/Behavioral: Negative.   All other systems reviewed and are negative.     Allergies  Ciprofloxacin; Demerol; Lidocaine; Other; Septra; Codeine; and Novocain  Home Medications   Prior to Admission medications   Medication Sig Start Date End Date Taking? Authorizing Provider  acetaminophen (TYLENOL) 325 MG tablet Take 650 mg by mouth every 6 (six) hours as needed for moderate pain (pain).  Yes Historical Provider, MD  diphenhydrAMINE (BENADRYL) 25 MG tablet Take 25 mg by mouth daily as needed for allergies (allergies).    Yes Historical Provider, MD  FLUoxetine (PROZAC) 10 MG capsule Take 1 capsule (10 mg total) by mouth daily. 05/26/14  Yes Hosie Poisson, MD  furosemide (LASIX) 40 MG tablet Take 1 tablet (40 mg total) by mouth daily as needed for fluid (Take a lasix 40 mg and Kdur 20 meq if your weight goes up 5 lbs). 07/01/14  Yes Donne Hazel, MD  lidocaine-prilocaine (EMLA) cream Apply 1 application topically once a week. On Chemo Day (Monday). 03/27/14  Yes Historical Provider, MD   loperamide (IMODIUM) 2 MG capsule Take 2 mg by mouth daily as needed for diarrhea or loose stools.   Yes Historical Provider, MD  LORazepam (ATIVAN) 0.5 MG tablet Take 1 tablet (0.5 mg total) by mouth every 8 (eight) hours as needed. Nausea 07/27/14  Yes Nicholas Lose, MD  metroNIDAZOLE (FLAGYL) 500 MG tablet Take 1 tablet (500 mg total) by mouth 3 (three) times daily. One po bid x 15 days 08/01/14  Yes Malvin Johns, MD  ondansetron (ZOFRAN) 8 MG tablet Take 8 mg by mouth daily as needed for nausea (nausea).  03/30/14  Yes Historical Provider, MD  potassium chloride (MICRO-K) 10 MEQ CR capsule Take 3 capsules (30 mEq total) by mouth daily. 08/03/14  Yes Nicholas Lose, MD  Probiotic Product (PROBIOTIC DAILY PO) Take 1 tablet by mouth daily.    Yes Historical Provider, MD  cholecalciferol (VITAMIN D) 1000 UNITS tablet Take 1,000 Units by mouth daily.     Historical Provider, MD   BP 123/73 mmHg  Pulse 102  Temp(Src) 97.6 F (36.4 C) (Oral)  Resp 20  SpO2 96% Physical Exam  Constitutional: She appears well-developed and well-nourished.  HENT:  Head: Normocephalic and atraumatic.  Mucous members dry  Eyes: Conjunctivae are normal. Pupils are equal, round, and reactive to light.  Neck: Neck supple. No tracheal deviation present. No thyromegaly present.  Cardiovascular: Normal rate and regular rhythm.   No murmur heard. Pulmonary/Chest: Effort normal and breath sounds normal.  Port-A-Cath in place right anterior chest. No redness swelling or tenderness at site  Abdominal: Soft. Bowel sounds are normal. She exhibits no distension. There is no tenderness.  Musculoskeletal: Normal range of motion. She exhibits no edema or tenderness.  Neurological: She is alert. No cranial nerve deficit. Coordination normal.  Gait normal  Skin: Skin is warm and dry. No rash noted.  Psychiatric: She has a normal mood and affect.  Nursing note and vitals reviewed.   ED Course  Procedures (including critical  care time) Labs Review Labs Reviewed  CBC WITH DIFFERENTIAL/PLATELET - Abnormal; Notable for the following:    Hemoglobin 11.4 (*)    HCT 35.7 (*)    RDW 18.1 (*)    Monocytes Relative 18 (*)    All other components within normal limits  COMPREHENSIVE METABOLIC PANEL - Abnormal; Notable for the following:    Potassium 2.6 (*)    Glucose, Bld 125 (*)    BUN <5 (*)    Creatinine, Ser 1.11 (*)    Total Protein 5.6 (*)    Albumin 3.3 (*)    GFR calc non Af Amer 50 (*)    GFR calc Af Amer 58 (*)    All other components within normal limits  LIPASE, BLOOD  URINALYSIS, ROUTINE W REFLEX MICROSCOPIC    Imaging Review No results found.  EKG Interpretation None     ED ECG REPORT   Date: 08/09/2014  Rate: 90  Rhythm: normal sinus rhythm  QRS Axis: normal  Intervals: normal  ST/T Wave abnormalities: nonspecific T wave changes  Conduction Disutrbances:none  Narrative Interpretation:   Old EKG Reviewed: Rate slower than tracing from 06/29/2014 otherwise no significant change interpreted by me  I have personally reviewed the EKG tracing and agree with the computerized printout as noted.  4 PM patient feels nausea improved after treatment with additional Zofran. Results for orders placed or performed during the hospital encounter of 08/09/14  CBC with Differential  Result Value Ref Range   WBC 4.3 4.0 - 10.5 K/uL   RBC 4.13 3.87 - 5.11 MIL/uL   Hemoglobin 11.4 (L) 12.0 - 15.0 g/dL   HCT 35.7 (L) 36.0 - 46.0 %   MCV 86.4 78.0 - 100.0 fL   MCH 27.6 26.0 - 34.0 pg   MCHC 31.9 30.0 - 36.0 g/dL   RDW 18.1 (H) 11.5 - 15.5 %   Platelets 213 150 - 400 K/uL   Neutrophils Relative % 61 43 - 77 %   Neutro Abs 2.6 1.7 - 7.7 K/uL   Lymphocytes Relative 21 12 - 46 %   Lymphs Abs 0.9 0.7 - 4.0 K/uL   Monocytes Relative 18 (H) 3 - 12 %   Monocytes Absolute 0.8 0.1 - 1.0 K/uL   Eosinophils Relative 0 0 - 5 %   Eosinophils Absolute 0.0 0.0 - 0.7 K/uL   Basophils Relative 0 0 - 1 %    Basophils Absolute 0.0 0.0 - 0.1 K/uL  Comprehensive metabolic panel  Result Value Ref Range   Sodium 144 135 - 145 mmol/L   Potassium 2.6 (LL) 3.5 - 5.1 mmol/L   Chloride 108 96 - 112 mmol/L   CO2 25 19 - 32 mmol/L   Glucose, Bld 125 (H) 70 - 99 mg/dL   BUN <5 (L) 6 - 23 mg/dL   Creatinine, Ser 1.11 (H) 0.50 - 1.10 mg/dL   Calcium 8.5 8.4 - 10.5 mg/dL   Total Protein 5.6 (L) 6.0 - 8.3 g/dL   Albumin 3.3 (L) 3.5 - 5.2 g/dL   AST 23 0 - 37 U/L   ALT 10 0 - 35 U/L   Alkaline Phosphatase 69 39 - 117 U/L   Total Bilirubin 0.8 0.3 - 1.2 mg/dL   GFR calc non Af Amer 50 (L) >90 mL/min   GFR calc Af Amer 58 (L) >90 mL/min   Anion gap 11 5 - 15  Lipase, blood  Result Value Ref Range   Lipase 25 11 - 59 U/L  Urinalysis, Routine w reflex microscopic  Result Value Ref Range   Color, Urine YELLOW YELLOW   APPearance CLEAR CLEAR   Specific Gravity, Urine 1.005 1.005 - 1.030   pH 6.5 5.0 - 8.0   Glucose, UA NEGATIVE NEGATIVE mg/dL   Hgb urine dipstick NEGATIVE NEGATIVE   Bilirubin Urine NEGATIVE NEGATIVE   Ketones, ur NEGATIVE NEGATIVE mg/dL   Protein, ur NEGATIVE NEGATIVE mg/dL   Urobilinogen, UA 0.2 0.0 - 1.0 mg/dL   Nitrite NEGATIVE NEGATIVE   Leukocytes, UA NEGATIVE NEGATIVE  Troponin I  Result Value Ref Range   Troponin I 0.72 (HH) <0.031 ng/mL  Magnesium  Result Value Ref Range   Magnesium 1.2 (L) 1.5 - 2.5 mg/dL   No results found.  MDM  With elevated troponin and dyspnea on exertion a few days ago possibility of  N STEMI exists. I spoke with Dr.Rizwan, plan admit telemetry, cardiology consult, aspirin, potassium and magnesium supplementation. Spoke with Dr. Debara Pickett from cardiology service who will see patient in consultation Final diagnoses:  None   Dx #1 Nausea and vomiting #2 dehydration #3hypokalemia #4 hypomagnesemia     Orlie Dakin, MD 08/09/14 [id number]

## 2014-08-09 NOTE — ED Notes (Signed)
Pt ambulated to restroom. 

## 2014-08-09 NOTE — ED Notes (Signed)
Pt dry heaving. Potassium held, will ask for IV K+

## 2014-08-10 ENCOUNTER — Ambulatory Visit: Payer: Medicare Other

## 2014-08-10 ENCOUNTER — Other Ambulatory Visit: Payer: Medicare Other

## 2014-08-10 ENCOUNTER — Ambulatory Visit: Payer: Medicare Other | Admitting: Hematology and Oncology

## 2014-08-10 ENCOUNTER — Telehealth: Payer: Self-pay | Admitting: *Deleted

## 2014-08-10 ENCOUNTER — Encounter: Payer: Self-pay | Admitting: Hematology and Oncology

## 2014-08-10 DIAGNOSIS — A047 Enterocolitis due to Clostridium difficile: Secondary | ICD-10-CM

## 2014-08-10 DIAGNOSIS — R7989 Other specified abnormal findings of blood chemistry: Secondary | ICD-10-CM

## 2014-08-10 DIAGNOSIS — E860 Dehydration: Secondary | ICD-10-CM | POA: Diagnosis not present

## 2014-08-10 DIAGNOSIS — E876 Hypokalemia: Secondary | ICD-10-CM | POA: Diagnosis not present

## 2014-08-10 LAB — CBC
HEMATOCRIT: 36.9 % (ref 36.0–46.0)
HEMOGLOBIN: 11.7 g/dL — AB (ref 12.0–15.0)
MCH: 27.7 pg (ref 26.0–34.0)
MCHC: 31.7 g/dL (ref 30.0–36.0)
MCV: 87.4 fL (ref 78.0–100.0)
Platelets: 232 10*3/uL (ref 150–400)
RBC: 4.22 MIL/uL (ref 3.87–5.11)
RDW: 18.4 % — ABNORMAL HIGH (ref 11.5–15.5)
WBC: 4 10*3/uL (ref 4.0–10.5)

## 2014-08-10 LAB — TROPONIN I: TROPONIN I: 0.64 ng/mL — AB (ref <0.031)

## 2014-08-10 LAB — BASIC METABOLIC PANEL
Anion gap: 9 (ref 5–15)
CHLORIDE: 110 mmol/L (ref 96–112)
CO2: 25 mmol/L (ref 19–32)
Calcium: 8.3 mg/dL — ABNORMAL LOW (ref 8.4–10.5)
Creatinine, Ser: 0.94 mg/dL (ref 0.50–1.10)
GFR calc Af Amer: 71 mL/min — ABNORMAL LOW (ref >90)
GFR, EST NON AFRICAN AMERICAN: 61 mL/min — AB (ref >90)
GLUCOSE: 97 mg/dL (ref 70–99)
Potassium: 3 mmol/L — ABNORMAL LOW (ref 3.5–5.1)
SODIUM: 144 mmol/L (ref 135–145)

## 2014-08-10 LAB — MAGNESIUM: Magnesium: 2 mg/dL (ref 1.5–2.5)

## 2014-08-10 MED ORDER — POTASSIUM CHLORIDE CRYS ER 20 MEQ PO TBCR
40.0000 meq | EXTENDED_RELEASE_TABLET | ORAL | Status: DC
  Administered 2014-08-10: 40 meq via ORAL
  Filled 2014-08-10: qty 2

## 2014-08-10 MED ORDER — HEPARIN SOD (PORK) LOCK FLUSH 100 UNIT/ML IV SOLN
500.0000 [IU] | INTRAVENOUS | Status: AC | PRN
  Administered 2014-08-10: 500 [IU]

## 2014-08-10 NOTE — Progress Notes (Signed)
    Subjective:  Denies CP or dyspnea   Objective:  Filed Vitals:   08/09/14 1744 08/09/14 1747 08/09/14 2147 08/10/14 0629  BP: 121/82  136/73 166/85  Pulse: 91  85 92  Temp: 98 F (36.7 C)  98.3 F (36.8 C) 98.3 F (36.8 C)  TempSrc:   Oral Oral  Resp: 18  18 20   Height:  5\' 3"  (1.6 m)    Weight:  175 lb 1.6 oz (79.425 kg)    SpO2: 95%  100% 96%    Intake/Output from previous day: No intake or output data in the 24 hours ending 08/10/14 0722  Physical Exam: Physical exam: Well-developed chronically ill appearing in no acute distress.  Skin is warm and dry.  HEENT is normal.  Neck is supple.  Chest is clear to auscultation with normal expansion.  Cardiovascular exam is regular rate and rhythm.  Abdominal exam nontender or distended. No masses palpated. Extremities show no edema. neuro grossly intact    Lab Results: Basic Metabolic Panel:  Recent Labs  08/09/14 1420 08/09/14 1425 08/10/14 0417  NA 144  --  144  K 2.6*  --  3.0*  CL 108  --  110  CO2 25  --  25  GLUCOSE 125*  --  97  BUN <5*  --  <5*  CREATININE 1.11*  --  0.94  CALCIUM 8.5  --  8.3*  MG  --  1.2* 2.0   CBC:  Recent Labs  08/09/14 1420 08/10/14 0417  WBC 4.3 4.0  NEUTROABS 2.6  --   HGB 11.4* 11.7*  HCT 35.7* 36.9  MCV 86.4 87.4  PLT 213 232   Cardiac Enzymes:  Recent Labs  08/09/14 1715 08/09/14 2234 08/10/14 0434  TROPONINI 0.71* 0.66* 0.64*     Assessment/Plan:  1 elevated troponin-this was noted in February as well. She is not having chest pain. There is no clear trend up suggesting acute coronary syndrome. This may be related to Adriamycin. Would not pursue further inpatient ischemia evaluation. 2 chronic diastolic congestive heart failure-she feels her volume status may be elevated this morning. Would discontinue IV fluids. She has a prescription for Lasix 40 mg as needed. 3 prolonged QT interval-most likely related to electrolyte abnormalities. Agree with  magnesium and potassium supplementation. 4 breast cancer-follow-up hematology oncology. Patient can be discharged from a cardiac standpoint and follow-up with Dr. Acie Fredrickson. Please call with questions.  Kirk Ruths 08/10/2014, 7:22 AM

## 2014-08-10 NOTE — Progress Notes (Signed)
I completed the D/C teaching. I answered all questions and the pt is being D/C in stable condition to home with family.

## 2014-08-10 NOTE — Progress Notes (Signed)
SATURATION QUALIFICATIONS: (This note is used to comply with regulatory documentation for home oxygen)  Patient Saturations on Room Air at Rest = 99%  Patient Saturations on Room Air while Ambulating = 98%  Patient Saturations on 2 Liters of oxygen while Ambulating = 97%  Please briefly explain why patient needs home oxygen:

## 2014-08-10 NOTE — Telephone Encounter (Signed)
Received report from [facility] that patient needed to cancel today's appt due to hospitalization. Sent to scan.

## 2014-08-10 NOTE — Discharge Summary (Addendum)
Physician Discharge Summary  HAIZLEE HENTON HKV:[id number] DOB: [birth year] DOA: 08/09/2014  PCP: Gennette Pac, MD  Admit date: 08/09/2014 Discharge date: 08/10/2014  Time spent: 45 minutes  Recommendations for Outpatient Follow-up:  1. Check K and Mg in 2 days at cancer center- discussed with patient   Discharge Condition: stable Diet recommendation: as tolerated  Discharge Diagnoses:  Principal Problem:   Dehydration Active Problems:   Nausea with vomiting   Hypokalemia   Hypomagnesemia   Breast cancer of upper-outer quadrant of right female breast   Enteritis due to Clostridium difficile   Elevated troponin   History of present illness:  Cheyenne Riggs is a 69 y.o. female with breast cancer, receiving chemo, chronic nausea, recent c diff colitis who comes in for vomiting large amounts liquids x 2 today. She has been on a liquid diet for 2 wks. She was diagnosed with C diff colitis a little over a week ago. She states that her cancer doctor is aware that she is only on liquids. She received IVF at the cancer center 3 x last week on consecutive days then followed by a day of Lasix as she was thought to have gotten fluid overloaded- her shortness of breath did go away with Lasix.  She was given 1 L of fluid in the ER. K and Mg were low and replaced. She is on daily K replacements. She was found to have an elevated troponin and is being referred for admission. She had an elevated TNI on 2/17 during her admission for dehydration. An ECHO was done which showed a normal EF, she was diuresed and asked to f/u with Dr Cathie Olden as an outpt. She has never had vomiting with her Chemo and now there is concern that she may have acute coronary syndrome.   Hospital Course:  Principal Problem:  Acute coronary syndrome - referred by ER for elevated troponin and new onset vomiting - cardiology  consulted by ER- followed 3 sets of troponin and troponin levels remained flat- cardiology evaluated her  again today and does not feel she need further work up   Active Problems:  Nausea with vomiting/ Dehydration - Hydrated agressively - received 2nd liter of fluid and then 150 cc of NS as ordered by ER-  - vomiting has resolved today she suspect the symptoms are from chemo - she is tolerating full liquids which she has been doing at home   Hypokalemia - replaced yesterday and today with oral and IV KCL- will need to continue home dose of KCL and have cancer center recheck in 2 days   Hypomagnesemia - giving 2 gm of Mg yesterday and now norma - as mentioned above, will need closer monitoring as outpatient  Prolonged Qtc - due to hypomagnesemia and hypokalemia-   Breast cancer of upper-outer quadrant of right female breast - s/p lumpectomy  - ongoing chemo per oncology which she states she will be finishing soon   Enteritis due to Clostridium difficile -cont oral Flagyl    Procedures:  none  Consultations:  cardiology  Discharge Exam: Filed Weights   08/09/14 1747  Weight: 79.425 kg (175 lb 1.6 oz)   Filed Vitals:   08/10/14 0830  BP: 141/78  Pulse: 97  Temp: 97.9 F (36.6 C)  Resp: 18    General: AAO x 3, no distress Cardiovascular: RRR, no murmurs  Respiratory: clear to auscultation bilaterally GI: soft, non-tender, non-distended, bowel sound positive  Discharge Instructions You were cared for by a hospitalist during  your hospital stay. If you have any questions about your discharge medications or the care you received while you were in the hospital after you are discharged, you can call the unit and asked to speak with the hospitalist on call if the hospitalist that took care of you is not available. Once you are discharged, your primary care physician will handle any further medical issues. Please note that NO REFILLS for any discharge medications will be authorized once you are discharged, as it is imperative that you return to your primary care  physician (or establish a relationship with a primary care physician if you do not have one) for your aftercare needs so that they can reassess your need for medications and monitor your lab values.  Discharge Instructions    Discharge instructions    Complete by:  As directed   Diet as tolerated- have potassium and magnesium checked in 2 days.     Increase activity slowly    Complete by:  As directed             Medication List    TAKE these medications        acetaminophen 325 MG tablet  Commonly known as:  TYLENOL  Take 650 mg by mouth every 6 (six) hours as needed for moderate pain (pain).     cholecalciferol 1000 UNITS tablet  Commonly known as:  VITAMIN D  Take 1,000 Units by mouth daily.     diphenhydrAMINE 25 MG tablet  Commonly known as:  BENADRYL  Take 25 mg by mouth daily as needed for allergies (allergies).     FLUoxetine 10 MG capsule  Commonly known as:  PROZAC  Take 1 capsule (10 mg total) by mouth daily.     furosemide 40 MG tablet  Commonly known as:  LASIX  Take 1 tablet (40 mg total) by mouth daily as needed for fluid (Take a lasix 40 mg and Kdur 20 meq if your weight goes up 5 lbs).     lidocaine-prilocaine cream  Commonly known as:  EMLA  Apply 1 application topically once a week. On Chemo Day (Monday).     loperamide 2 MG capsule  Commonly known as:  IMODIUM  Take 2 mg by mouth daily as needed for diarrhea or loose stools.     LORazepam 0.5 MG tablet  Commonly known as:  ATIVAN  Take 1 tablet (0.5 mg total) by mouth every 8 (eight) hours as needed. Nausea     metroNIDAZOLE 500 MG tablet  Commonly known as:  FLAGYL  Take 1 tablet (500 mg total) by mouth 3 (three) times daily. One po bid x 15 days     ondansetron 8 MG tablet  Commonly known as:  ZOFRAN  Take 8 mg by mouth daily as needed for nausea (nausea).     potassium chloride 10 MEQ CR capsule  Commonly known as:  MICRO-K  Take 3 capsules (30 mEq total) by mouth daily.     PROBIOTIC  DAILY PO  Take 1 tablet by mouth daily.       Allergies  Allergen Reactions  . Ciprofloxacin Nausea And Vomiting  . Demerol [Meperidine] Hives  . Lidocaine     palpatations  . Other Swelling    Eye ointments  . Septra [Sulfamethoxazole-Trimethoprim] Nausea And Vomiting  . Codeine Rash  . Novocain [Procaine] Palpitations      The results of significant diagnostics from this hospitalization (including imaging, microbiology, ancillary and laboratory) are listed below for  reference.    Significant Diagnostic Studies: No results found.  Microbiology: Recent Results (from the past 240 hour(s))  Clostridium Difficile by PCR     Status: Abnormal   Collection Time: 07/31/14  2:30 PM  Result Value Ref Range Status   C difficile by pcr POSITIVE (A) NEGATIVE Final    Comment: CRITICAL RESULT CALLED TO, READ BACK BY AND VERIFIED WITH: INFORMED LORIE EPPERSON (HOUSE COVERAGE) @2030  ON 3.18.16 BY MCCOY,N.   TECHNOLOGIST REVIEW     Status: None   Collection Time: 08/03/14 10:22 AM  Result Value Ref Range Status   Technologist Review Metas and Myelocytes present  Final     Labs: Basic Metabolic Panel:  Recent Labs Lab 08/03/14 1022 08/09/14 1420 08/09/14 1425 08/10/14 0417  NA 143 144  --  144  K 3.0* 2.6*  --  3.0*  CL  --  108  --  110  CO2 20* 25  --  25  GLUCOSE 88 125*  --  97  BUN 3.4* <5*  --  <5*  CREATININE 1.0 1.11*  --  0.94  CALCIUM 8.8 8.5  --  8.3*  MG  --   --  1.2* 2.0   Liver Function Tests:  Recent Labs Lab 08/03/14 1022 08/09/14 1420  AST 20 23  ALT 10 10  ALKPHOS 69 69  BILITOT 0.53 0.8  PROT 5.4* 5.6*  ALBUMIN 2.9* 3.3*    Recent Labs Lab 08/09/14 1420  LIPASE 25   No results for input(s): AMMONIA in the last 168 hours. CBC:  Recent Labs Lab 08/03/14 1022 08/09/14 1420 08/10/14 0417  WBC 3.8* 4.3 4.0  NEUTROABS 2.7 2.6  --   HGB 8.2* 11.4* 11.7*  HCT 25.5* 35.7* 36.9  MCV 87.6 86.4 87.4  PLT 246 213 232   Cardiac  Enzymes:  Recent Labs Lab 08/09/14 1425 08/09/14 1715 08/09/14 2234 08/10/14 0434  TROPONINI 0.72* 0.71* 0.66* 0.64*   BNP: BNP (last 3 results)  Recent Labs  06/29/14 1740  BNP 534.8*    ProBNP (last 3 results) No results for input(s): PROBNP in the last 8760 hours.  CBG: No results for input(s): GLUCAP in the last 168 hours.     SignedDebbe Odea, MD [facility] 08/10/2014, 9:49 AM

## 2014-08-10 NOTE — Progress Notes (Signed)
UR completed 

## 2014-08-11 ENCOUNTER — Other Ambulatory Visit: Payer: Self-pay | Admitting: Hematology and Oncology

## 2014-08-11 ENCOUNTER — Telehealth: Payer: Self-pay | Admitting: Hematology and Oncology

## 2014-08-11 ENCOUNTER — Other Ambulatory Visit: Payer: Self-pay | Admitting: *Deleted

## 2014-08-11 ENCOUNTER — Telehealth: Payer: Self-pay | Admitting: *Deleted

## 2014-08-11 ENCOUNTER — Encounter: Payer: Self-pay | Admitting: Nurse Practitioner

## 2014-08-11 DIAGNOSIS — C50411 Malignant neoplasm of upper-outer quadrant of right female breast: Secondary | ICD-10-CM

## 2014-08-11 MED ORDER — LORAZEPAM 0.5 MG PO TABS: 0.5000 mg | ORAL_TABLET | Freq: Three times a day (TID) | ORAL | Status: DC | PRN

## 2014-08-11 NOTE — Assessment & Plan Note (Signed)
Patient has been experiencing chronic nausea/vomiting and diarrhea since her last chemotherapy.  She was diagnosed with positive stool for C. difficile recently; and continues with Flagyl antibiotics.  She has been coming to the [facility] for additional IV fluid rehydration on a daily basis this entire week.  Patient states that her nausea/vomiting has greatly improved; and is reporting only one diarrhea episode within the past 24 hours since initiating the Flagyl.  However, patient does feel increasingly short of breath any type of exertion whatsoever.  She is also noted some mild bilateral lower extremity edema as well.  On exam-lungs clear bilaterally.  New acute respiratory distress noted while at rest.  However, patient did walk down the hall; and quickly became short of breath.  Advised the patient that she may be slightly fluid overloaded due to all of the IV fluid rehydration she has been given this past week.  He should states that she has Lasix at home to take for increasing shortness of breath and weight gain/edema.  Advised patient to hold IV fluid rehydration today; and to push fluids instead.  Advised her to take Lasix 20 mg on a one-time basis today to see if this helps.  Advised patient that she gave very well need to return either tomorrow 08/08/2014 or early next week for additional IV fluid rehydration.

## 2014-08-11 NOTE — Assessment & Plan Note (Signed)
Patient has recently tested positive for C. difficile colitis.  Patient is currently undergoing Flagyl antibiotics; and states that her diarrhea issues have greatly improved.

## 2014-08-11 NOTE — Assessment & Plan Note (Signed)
Patient received cycle 2, day 8 of her Abraxane chemotherapy on 07/27/2014.  Patient is scheduled for cycle 2, day 15 of the same regimen on 08/10/2014.Marland Kitchen

## 2014-08-11 NOTE — Telephone Encounter (Signed)
Spoke with patient and she is aware of her appointments °

## 2014-08-11 NOTE — Assessment & Plan Note (Signed)
Patient has been nauseous; with intermittent vomiting since her last chemotherapy cycle.  Patient states she's been taken Zofran at home with only minimal effectiveness.  She has become dehydrated; and has been receiving IV fluid rehydration here at the [facility] on a daily basis.  Patient is happy to report that her nausea/vomiting has greatly improved within the past few days.

## 2014-08-11 NOTE — Telephone Encounter (Signed)
Patient states that she is experiencing more diarrhea this morning after taking her flagyl and potassium medication. Patient took imodium with a little relief. Patient is very concerned and would like to know if MD would like for her to be seen.

## 2014-08-11 NOTE — Progress Notes (Signed)
SYMPTOM MANAGEMENT CLINIC   HPI: Cheyenne Riggs 69 y.o. female diagnosed with breast cancer.  Currently undergoing Abraxane chemotherapy regimen.  Patient received cycle 2, day 8 of her Abraxane chemotherapy on 07/27/2014.  Patient is complaining of chronic nausea/vomiting and diarrhea since her last cycle of chemotherapy.  She has been taken Zofran home with only minimal effectiveness.  She has recently been diagnosed with positive stool for C. difficile colitis.  She is currently taking Flagyl as prescribed.  She has been coming to the [facility] for IV fluid rehydration on a daily basis this past week.  She states her nausea/vomiting has greatly improved; as well as her diarrhea.  She has been able to drink more fluids at home with minimal difficulty.  However, patient is complaining of some increased shortness of breath with any type of exertion whatsoever.  She's also noted some bilateral lower extremity edema within the past few days as well.  She denies any recent fevers or chills.  HPI  ROS  Past Medical History  Diagnosis Date  . Anxiety   . Back pain   . H/O bladder problems   . Cholelithiasis   . Depression   . High blood pressure   . Hypercholesteremia   . Kidney stones   . Gum disease   . Complication of anesthesia     bp goes up  . Wears glasses   . Wears dentures     top    Past Surgical History  Procedure Laterality Date  . Colon biopsy    . Mouth surgery    . Spine surgery    . Colonoscopy    . Tubal ligation    . Lithotripsy    . Rhinoplasty    . Back surgery  1989    lumb lam  . Breast biopsy Right 02/05/2014    invasive ductal cncer  . Abdominal hysterectomy  1982  . Cholecystectomy  2011    lap choli  . Radioactive seed guided mastectomy with axillary sentinel lymph node biopsy Right 02/27/2014    Procedure: RADIOACTIVE SEED GUIDED RIGHT PARTIAL MASTECTOMY WITH AXILLARY SENTINEL LYMPH NODE BIOPSY;  Surgeon: Stark Klein, MD;  Location: [facility];  Service: General;  Laterality: Right;  . Portacath placement N/A 02/27/2014    Procedure: INSERTION PORT-A-CATH;  Surgeon: Stark Klein, MD;  Location: [facility];  Service: General;  Laterality: N/A;    has Breast cancer of upper-outer quadrant of right female breast; Essential hypertension, benign; Neutropenic fever; SIRS (systemic inflammatory response syndrome); Diarrhea; Enteritis due to Clostridium difficile; Atypical pneumonia; CHF (congestive heart failure); Antineoplastic chemotherapy induced anemia; Nausea with vomiting; Dehydration; Fatigue; Anorexia; Rash; Hand foot syndrome; Hypokalemia; Hypoalbuminemia; Elevated troponin; and Hypomagnesemia on her problem list.    is allergic to ciprofloxacin; demerol; lidocaine; other; septra; codeine; and novocain.    Medication List       This list is accurate as of: 08/07/14 11:59 PM.  Always use your most recent med list.               acetaminophen 325 MG tablet  Commonly known as:  TYLENOL  Take 650 mg by mouth every 6 (six) hours as needed for moderate pain (pain).     cholecalciferol 1000 UNITS tablet  Commonly known as:  VITAMIN D  Take 1,000 Units by mouth daily.     diphenhydrAMINE 25 MG tablet  Commonly known as:  BENADRYL  Take 25 mg by mouth daily  as needed for allergies (allergies).     FLUoxetine 10 MG capsule  Commonly known as:  PROZAC  Take 1 capsule (10 mg total) by mouth daily.     furosemide 40 MG tablet  Commonly known as:  LASIX  Take 1 tablet (40 mg total) by mouth daily as needed for fluid (Take a lasix 40 mg and Kdur 20 meq if your weight goes up 5 lbs).     lidocaine-prilocaine cream  Commonly known as:  EMLA  Apply 1 application topically once a week. On Chemo Day (Monday).     loperamide 2 MG capsule  Commonly known as:  IMODIUM  Take 2 mg by mouth daily as needed for diarrhea or loose stools.     LORazepam 0.5 MG tablet  Commonly known as:  ATIVAN  Take  1 tablet (0.5 mg total) by mouth every 8 (eight) hours as needed. Nausea     metroNIDAZOLE 500 MG tablet  Commonly known as:  FLAGYL  Take 1 tablet (500 mg total) by mouth 3 (three) times daily. One po bid x 15 days     ondansetron 8 MG tablet  Commonly known as:  ZOFRAN  Take 8 mg by mouth daily as needed for nausea (nausea).     potassium chloride 10 MEQ CR capsule  Commonly known as:  MICRO-K  Take 3 capsules (30 mEq total) by mouth daily.     PROBIOTIC DAILY PO  Take 1 tablet by mouth daily.         PHYSICAL EXAMINATION  Oncology Vitals 08/10/2014 08/10/2014 08/09/2014 08/09/2014 08/09/2014 08/09/2014 08/09/2014  Height - - - 160 cm - - -  Weight - - - 79.425 kg - - -  Weight (lbs) - - - 175 lbs 2 oz - - -  BMI (kg/m2) - - - 31.02 kg/m2 - - -  Temp 97.9 98.3 98.3 - 98 - -  Pulse 97 92 85 - 91 94 85  Resp 18 20 18  - 18 18 -  SpO2 99 96 100 - 95 98 96  BSA (m2) - - - 1.88 m2 - - -   BP Readings from Last 3 Encounters:  08/10/14 141/78  08/07/14 142/72  08/03/14 130/66    Physical Exam  Constitutional: She is oriented to person, place, and time. Vital signs are normal. She appears unhealthy.  HENT:  Head: Normocephalic and atraumatic.  Mouth/Throat: Oropharynx is clear and moist.  Eyes: Conjunctivae and EOM are normal. Pupils are equal, round, and reactive to light. Right eye exhibits no discharge. Left eye exhibits no discharge. No scleral icterus.  Neck: Normal range of motion. No JVD present. No tracheal deviation present. No thyromegaly present.  Cardiovascular: Normal rate, regular rhythm, normal heart sounds and intact distal pulses.   Pulmonary/Chest: Effort normal and breath sounds normal. No respiratory distress. She has no wheezes. She has no rales. She exhibits no tenderness.  Breath sounds clear bilaterally; with no wheezing or coughing.  However, patient did become quite short of breath with short walk down the hall while at the cancer center.  Abdominal: Soft.  Bowel sounds are normal. She exhibits no distension and no mass. There is no tenderness. There is no rebound and no guarding.  Musculoskeletal: Normal range of motion. She exhibits edema. She exhibits no tenderness.  Trace edema to bilateral lower extremities.  Lymphadenopathy:    She has no cervical adenopathy.  Neurological: She is alert and oriented to person, place, and time. Gait normal.  Skin: Skin is warm and dry. No rash noted. No erythema. There is pallor.  Psychiatric: Affect normal.  Nursing note and vitals reviewed.   LABORATORY DATA:. No visits with results within 3 Day(s) from this visit. Latest known visit with results is:  Hospital Outpatient Visit on 08/03/2014  Component Date Value Ref Range Status  . Order Confirmation 08/03/2014 ORDER PROCESSED BY BLOOD BANK   Final  . ABO/RH(D) 08/03/2014 A POS   Final  . Antibody Screen 08/03/2014 NEG   Final  . Sample Expiration 08/03/2014 08/06/2014   Final  . Unit Number 08/03/2014 [id number]   Final  . Blood Component Type 08/03/2014 RED CELLS,LR   Final  . Unit division 08/03/2014 00   Final  . Status of Unit 08/03/2014 ISSUED,FINAL   Final  . Transfusion Status 08/03/2014 OK TO TRANSFUSE   Final  . Crossmatch Result 08/03/2014 Compatible   Final     RADIOGRAPHIC STUDIES: No results found.  ASSESSMENT/PLAN:    Breast cancer of upper-outer quadrant of right female breast Patient received cycle 2, day 8 of her Abraxane chemotherapy on 07/27/2014.  Patient is scheduled for cycle 2, day 15 of the same regimen on 08/10/2014..       Dehydration Patient has been experiencing chronic nausea/vomiting and diarrhea since her last chemotherapy.  She was diagnosed with positive stool for C. difficile recently; and continues with Flagyl antibiotics.  She has been coming to the [facility] for additional IV fluid rehydration on a daily basis this entire week.  Patient states that her nausea/vomiting has greatly improved;  and is reporting only one diarrhea episode within the past 24 hours since initiating the Flagyl.  However, patient does feel increasingly short of breath any type of exertion whatsoever.  She is also noted some mild bilateral lower extremity edema as well.  On exam-lungs clear bilaterally.  New acute respiratory distress noted while at rest.  However, patient did walk down the hall; and quickly became short of breath.  Advised the patient that she may be slightly fluid overloaded due to all of the IV fluid rehydration she has been given this past week.  He should states that she has Lasix at home to take for increasing shortness of breath and weight gain/edema.  Advised patient to hold IV fluid rehydration today; and to push fluids instead.  Advised her to take Lasix 20 mg on a one-time basis today to see if this helps.  Advised patient that she gave very well need to return either tomorrow 08/08/2014 or early next week for additional IV fluid rehydration.     Diarrhea Patient has recently tested positive for C. difficile colitis.  Patient is currently undergoing Flagyl antibiotics; and states that her diarrhea issues have greatly improved.   Nausea with vomiting Patient has been nauseous; with intermittent vomiting since her last chemotherapy cycle.  Patient states she's been taken Zofran at home with only minimal effectiveness.  She has become dehydrated; and has been receiving IV fluid rehydration here at the [facility] on a daily basis.  Patient is happy to report that her nausea/vomiting has greatly improved within the past few days.     Patient stated understanding of all instructions; and was in agreement with this plan of care. The patient knows to call the clinic with any problems, questions or concerns.   Review/collaboration with Dr. Lindi Adie regarding all aspects of patient's visit today.   Total time spent with patient was 25 minutes;  with greater  than 75 percent of that time  spent in face to face counseling regarding patient's symptoms,  and coordination of care and follow up.  Disclaimer: This note was dictated with voice recognition software. Similar sounding words can inadvertently be transcribed and may not be corrected upon review.   Drue Second, NP 08/11/2014

## 2014-08-13 ENCOUNTER — Ambulatory Visit (HOSPITAL_BASED_OUTPATIENT_CLINIC_OR_DEPARTMENT_OTHER): Payer: Medicare Other | Admitting: Nurse Practitioner

## 2014-08-13 ENCOUNTER — Other Ambulatory Visit: Payer: Self-pay | Admitting: Nurse Practitioner

## 2014-08-13 ENCOUNTER — Ambulatory Visit (HOSPITAL_BASED_OUTPATIENT_CLINIC_OR_DEPARTMENT_OTHER): Payer: Medicare Other

## 2014-08-13 ENCOUNTER — Telehealth: Payer: Self-pay | Admitting: *Deleted

## 2014-08-13 ENCOUNTER — Other Ambulatory Visit (HOSPITAL_BASED_OUTPATIENT_CLINIC_OR_DEPARTMENT_OTHER): Payer: Medicare Other

## 2014-08-13 VITALS — BP 147/70 | HR 83 | Temp 97.5°F | Resp 19 | Wt 171.9 lb

## 2014-08-13 DIAGNOSIS — R197 Diarrhea, unspecified: Secondary | ICD-10-CM | POA: Diagnosis not present

## 2014-08-13 DIAGNOSIS — E860 Dehydration: Secondary | ICD-10-CM

## 2014-08-13 DIAGNOSIS — C50411 Malignant neoplasm of upper-outer quadrant of right female breast: Secondary | ICD-10-CM

## 2014-08-13 DIAGNOSIS — E876 Hypokalemia: Secondary | ICD-10-CM | POA: Diagnosis not present

## 2014-08-13 DIAGNOSIS — N289 Disorder of kidney and ureter, unspecified: Secondary | ICD-10-CM

## 2014-08-13 DIAGNOSIS — Z452 Encounter for adjustment and management of vascular access device: Secondary | ICD-10-CM | POA: Diagnosis not present

## 2014-08-13 LAB — COMPREHENSIVE METABOLIC PANEL (CC13)
ALT: 6 U/L (ref 0–55)
ANION GAP: 12 meq/L — AB (ref 3–11)
AST: 18 U/L (ref 5–34)
Albumin: 3.1 g/dL — ABNORMAL LOW (ref 3.5–5.0)
Alkaline Phosphatase: 70 U/L (ref 40–150)
BILIRUBIN TOTAL: 0.72 mg/dL (ref 0.20–1.20)
BUN: 4.7 mg/dL — AB (ref 7.0–26.0)
CHLORIDE: 105 meq/L (ref 98–109)
CO2: 24 meq/L (ref 22–29)
Calcium: 8.9 mg/dL (ref 8.4–10.4)
Creatinine: 1.2 mg/dL — ABNORMAL HIGH (ref 0.6–1.1)
EGFR: 48 mL/min/{1.73_m2} — AB (ref >90)
GLUCOSE: 127 mg/dL (ref 70–140)
POTASSIUM: 3.2 meq/L — AB (ref 3.5–5.1)
SODIUM: 142 meq/L (ref 136–145)
Total Protein: 5.8 g/dL — ABNORMAL LOW (ref 6.4–8.3)

## 2014-08-13 LAB — COMPREHENSIVE METABOLIC PANEL
ALK PHOS: 69 U/L (ref 39–117)
ALT: 10 U/L (ref 0–35)
AST: 23 U/L (ref 0–37)
Albumin: 3.3 g/dL — ABNORMAL LOW (ref 3.5–5.2)
Anion gap: 11 (ref 5–15)
CO2: 25 mmol/L (ref 19–32)
Calcium: 8.5 mg/dL (ref 8.4–10.5)
Chloride: 108 mmol/L (ref 96–112)
Creatinine, Ser: 1.11 mg/dL — ABNORMAL HIGH (ref 0.50–1.10)
GFR calc Af Amer: 58 mL/min — ABNORMAL LOW (ref >90)
GFR, EST NON AFRICAN AMERICAN: 50 mL/min — AB (ref >90)
Glucose, Bld: 125 mg/dL — ABNORMAL HIGH (ref 70–99)
Potassium: 2.6 mmol/L — CL (ref 3.5–5.1)
SODIUM: 144 mmol/L (ref 135–145)
Total Bilirubin: 0.8 mg/dL (ref 0.3–1.2)
Total Protein: 5.6 g/dL — ABNORMAL LOW (ref 6.0–8.3)

## 2014-08-13 LAB — CBC WITH DIFFERENTIAL/PLATELET
BASO%: 0.9 % (ref 0.0–2.0)
BASOS ABS: 0 10*3/uL (ref 0.0–0.1)
EOS%: 0.5 % (ref 0.0–7.0)
Eosinophils Absolute: 0 10*3/uL (ref 0.0–0.5)
HEMATOCRIT: 37.6 % (ref 34.8–46.6)
HEMOGLOBIN: 11.7 g/dL (ref 11.6–15.9)
LYMPH%: 13.2 % — ABNORMAL LOW (ref 14.0–49.7)
MCH: 27.1 pg (ref 25.1–34.0)
MCHC: 31.2 g/dL — ABNORMAL LOW (ref 31.5–36.0)
MCV: 86.7 fL (ref 79.5–101.0)
MONO#: 0.5 10*3/uL (ref 0.1–0.9)
MONO%: 11.1 % (ref 0.0–14.0)
NEUT#: 3.6 10*3/uL (ref 1.5–6.5)
NEUT%: 74.3 % (ref 38.4–76.8)
Platelets: 222 10*3/uL (ref 145–400)
RBC: 4.34 10*6/uL (ref 3.70–5.45)
RDW: 18.6 % — ABNORMAL HIGH (ref 11.2–14.5)
WBC: 4.9 10*3/uL (ref 3.9–10.3)
lymph#: 0.6 10*3/uL — ABNORMAL LOW (ref 0.9–3.3)

## 2014-08-13 MED ORDER — ALTEPLASE 2 MG IJ SOLR
2.0000 mg | Freq: Once | INTRAMUSCULAR | Status: AC
  Administered 2014-08-13: 2 mg
  Filled 2014-08-13: qty 2

## 2014-08-13 MED ORDER — HEPARIN SOD (PORK) LOCK FLUSH 100 UNIT/ML IV SOLN
500.0000 [IU] | Freq: Once | INTRAVENOUS | Status: AC
  Administered 2014-08-13: 500 [IU] via INTRAVENOUS
  Filled 2014-08-13: qty 5

## 2014-08-13 MED ORDER — SODIUM CHLORIDE 0.9 % IV SOLN
INTRAVENOUS | Status: AC
  Administered 2014-08-13: 12:00:00 via INTRAVENOUS

## 2014-08-13 MED ORDER — ONDANSETRON HCL 40 MG/20ML IJ SOLN
Freq: Once | INTRAMUSCULAR | Status: AC
  Administered 2014-08-13: 12:00:00 via INTRAVENOUS
  Filled 2014-08-13: qty 4

## 2014-08-13 MED ORDER — SODIUM CHLORIDE 0.9 % IJ SOLN
10.0000 mL | INTRAMUSCULAR | Status: DC | PRN
  Administered 2014-08-13: 10 mL via INTRAVENOUS
  Filled 2014-08-13: qty 10

## 2014-08-13 NOTE — Patient Instructions (Signed)

## 2014-08-13 NOTE — Telephone Encounter (Signed)
Patient called requesting fluids after labs today.  She continues not to eat or drink well, is feeling shakey and is having some nausea.  Spoke with Selena Lesser, NP who said that would be fine and she will add the orders.  POF to scheduling. Will call infusion.

## 2014-08-14 ENCOUNTER — Encounter: Payer: Self-pay | Admitting: Nurse Practitioner

## 2014-08-14 DIAGNOSIS — I499 Cardiac arrhythmia, unspecified: Secondary | ICD-10-CM

## 2014-08-14 DIAGNOSIS — D696 Thrombocytopenia, unspecified: Secondary | ICD-10-CM

## 2014-08-14 DIAGNOSIS — M6282 Rhabdomyolysis: Secondary | ICD-10-CM

## 2014-08-14 DIAGNOSIS — N289 Disorder of kidney and ureter, unspecified: Secondary | ICD-10-CM | POA: Insufficient documentation

## 2014-08-14 DIAGNOSIS — I471 Supraventricular tachycardia, unspecified: Secondary | ICD-10-CM

## 2014-08-14 DIAGNOSIS — N179 Acute kidney failure, unspecified: Secondary | ICD-10-CM

## 2014-08-14 HISTORY — DX: Acute kidney failure, unspecified: N17.9

## 2014-08-14 HISTORY — DX: Thrombocytopenia, unspecified: D69.6

## 2014-08-14 HISTORY — DX: Cardiac arrhythmia, unspecified: I49.9

## 2014-08-14 HISTORY — DX: Supraventricular tachycardia, unspecified: I47.10

## 2014-08-14 HISTORY — DX: Rhabdomyolysis: M62.82

## 2014-08-14 HISTORY — DX: Supraventricular tachycardia: I47.1

## 2014-08-14 NOTE — Assessment & Plan Note (Signed)
Creatinine elevated to 1.2 today.  Previously creatinine 0.94.  Most likely, elevated creatinine secondary to continued dehydration.  Will continue to monitor closely.

## 2014-08-14 NOTE — Progress Notes (Signed)
SYMPTOM MANAGEMENT CLINIC   HPI: Cheyenne Riggs 69 y.o. female diagnosed with breast cancer.  Currently undergoing Abraxane chemotherapy regimen.  Patient received cycle 2, day 8 of her Abraxane chemotherapy on 07/27/2014. She has recently been diagnosed with positive stool for C. difficile colitis.  She is currently taking Flagyl as prescribed.  She has been coming to the [facility] for IV fluid rehydration on an intermittent basis.  She denies any recent nausea/vomiting; and has not had diarrhea within the past 24 hours.  She continues to feel dehydrated; but  she has been able to drink more fluids at home with minimal difficulty. She is requesting IV fluid rehydration once again today. She denies any recent fevers or chills.  Diarrhea     Review of Systems  Gastrointestinal: Positive for diarrhea.    Past Medical History  Diagnosis Date  . Anxiety   . Back pain   . H/O bladder problems   . Cholelithiasis   . Depression   . High blood pressure   . Hypercholesteremia   . Kidney stones   . Gum disease   . Complication of anesthesia     bp goes up  . Wears glasses   . Wears dentures     top    Past Surgical History  Procedure Laterality Date  . Colon biopsy    . Mouth surgery    . Spine surgery    . Colonoscopy    . Tubal ligation    . Lithotripsy    . Rhinoplasty    . Back surgery  1989    lumb lam  . Breast biopsy Right 02/05/2014    invasive ductal cncer  . Abdominal hysterectomy  1982  . Cholecystectomy  2011    lap choli  . Radioactive seed guided mastectomy with axillary sentinel lymph node biopsy Right 02/27/2014    Procedure: RADIOACTIVE SEED GUIDED RIGHT PARTIAL MASTECTOMY WITH AXILLARY SENTINEL LYMPH NODE BIOPSY;  Surgeon: Stark Klein, MD;  Location: [facility];  Service: General;  Laterality: Right;  . Portacath placement N/A 02/27/2014    Procedure: INSERTION PORT-A-CATH;  Surgeon: Stark Klein, MD;  Location: [facility];  Service: General;  Laterality: N/A;    has Breast cancer of upper-outer quadrant of right female breast; Essential hypertension, benign; Neutropenic fever; SIRS (systemic inflammatory response syndrome); Diarrhea; Enteritis due to Clostridium difficile; Atypical pneumonia; CHF (congestive heart failure); Antineoplastic chemotherapy induced anemia; Nausea with vomiting; Dehydration; Fatigue; Anorexia; Rash; Hand foot syndrome; Hypokalemia; Hypoalbuminemia; Elevated troponin; Hypomagnesemia; and Renal insufficiency on her problem list.    is allergic to ciprofloxacin; demerol; lidocaine; other; septra; codeine; and novocain.    Medication List       This list is accurate as of: 08/13/14 11:59 PM.  Always use your most recent med list.               acetaminophen 325 MG tablet  Commonly known as:  TYLENOL  Take 650 mg by mouth every 6 (six) hours as needed for moderate pain (pain).     cholecalciferol 1000 UNITS tablet  Commonly known as:  VITAMIN D  Take 1,000 Units by mouth daily.     diphenhydrAMINE 25 MG tablet  Commonly known as:  BENADRYL  Take 25 mg by mouth daily as needed for allergies (allergies).     FLUoxetine 10 MG capsule  Commonly known as:  PROZAC  Take 1 capsule (10 mg total) by mouth daily.  furosemide 40 MG tablet  Commonly known as:  LASIX  Take 1 tablet (40 mg total) by mouth daily as needed for fluid (Take a lasix 40 mg and Kdur 20 meq if your weight goes up 5 lbs).     lidocaine-prilocaine cream  Commonly known as:  EMLA  Apply 1 application topically once a week. On Chemo Day (Monday).     loperamide 2 MG capsule  Commonly known as:  IMODIUM  Take 2 mg by mouth daily as needed for diarrhea or loose stools.     LORazepam 0.5 MG tablet  Commonly known as:  ATIVAN  Take 1 tablet (0.5 mg total) by mouth every 8 (eight) hours as needed. Nausea     metroNIDAZOLE 500 MG tablet  Commonly known as:  FLAGYL  Take 1 tablet (500 mg total) by  mouth 3 (three) times daily. One po bid x 15 days     ondansetron 8 MG tablet  Commonly known as:  ZOFRAN  Take 8 mg by mouth daily as needed for nausea (nausea).     potassium chloride 10 MEQ CR capsule  Commonly known as:  MICRO-K  Take 3 capsules (30 mEq total) by mouth daily.     PROBIOTIC DAILY PO  Take 1 tablet by mouth daily.         PHYSICAL EXAMINATION  Oncology Vitals 08/13/2014 08/13/2014 08/10/2014 08/10/2014 08/09/2014 08/09/2014 08/09/2014  Height - - - - - 160 cm -  Weight - 77.973 kg - - - 79.425 kg -  Weight (lbs) - 171 lbs 14 oz - - - 175 lbs 2 oz -  BMI (kg/m2) - - - - - 31.02 kg/m2 -  Temp - 97.5 97.9 98.3 98.3 - 98  Pulse 83 93 97 92 85 - 91  Resp - _0 - 18  SpO2 - 97 99 96 100 - 95  BSA (m2) - - - - - 1.88 m2 -   BP Readings from Last 3 Encounters:  08/13/14 147/70  08/10/14 141/78  08/07/14 142/72    Physical Exam  Constitutional: She is oriented to person, place, and time. Vital signs are normal. She appears unhealthy.  HENT:  Head: Normocephalic and atraumatic.  Mouth/Throat: Oropharynx is clear and moist.  Eyes: Conjunctivae and EOM are normal. Pupils are equal, round, and reactive to light. Right eye exhibits no discharge. Left eye exhibits no discharge. No scleral icterus.  Neck: Normal range of motion. No JVD present. No tracheal deviation present. No thyromegaly present.  Cardiovascular: Normal rate, regular rhythm, normal heart sounds and intact distal pulses.   Pulmonary/Chest: Effort normal and breath sounds normal. No respiratory distress. She has no wheezes. She has no rales. She exhibits no tenderness.  Abdominal: Soft. Bowel sounds are normal. She exhibits no distension and no mass. There is no tenderness. There is no rebound and no guarding.  Musculoskeletal: Normal range of motion. She exhibits no edema or tenderness.  Trace edema to bilateral lower extremities.  Lymphadenopathy:    She has no cervical adenopathy.    Neurological: She is alert and oriented to person, place, and time. Gait normal.  Skin: Skin is warm and dry. No rash noted. No erythema. No pallor.  Psychiatric: Affect normal.  Nursing note and vitals reviewed.   LABORATORY DATA:. Appointment on 08/13/2014  Component Date Value Ref Range Status  . WBC 08/13/2014 4.9  3.9 - 10.3 10e3/uL Final  . NEUT# 08/13/2014 3.6  1.5 - 6.5 10e3/uL  Final  . HGB 08/13/2014 11.7  11.6 - 15.9 g/dL Final  . HCT 08/13/2014 37.6  34.8 - 46.6 % Final  . Platelets 08/13/2014 222  145 - 400 10e3/uL Final  . MCV 08/13/2014 86.7  79.5 - 101.0 fL Final  . MCH 08/13/2014 27.1  25.1 - 34.0 pg Final  . MCHC 08/13/2014 31.2* 31.5 - 36.0 g/dL Final  . RBC 08/13/2014 4.34  3.70 - 5.45 10e6/uL Final  . RDW 08/13/2014 18.6* 11.2 - 14.5 % Final  . lymph# 08/13/2014 0.6* 0.9 - 3.3 10e3/uL Final  . MONO# 08/13/2014 0.5  0.1 - 0.9 10e3/uL Final  . Eosinophils Absolute 08/13/2014 0.0  0.0 - 0.5 10e3/uL Final  . Basophils Absolute 08/13/2014 0.0  0.0 - 0.1 10e3/uL Final  . NEUT% 08/13/2014 74.3  38.4 - 76.8 % Final  . LYMPH% 08/13/2014 13.2* 14.0 - 49.7 % Final  . MONO% 08/13/2014 11.1  0.0 - 14.0 % Final  . EOS% 08/13/2014 0.5  0.0 - 7.0 % Final  . BASO% 08/13/2014 0.9  0.0 - 2.0 % Final  . Sodium 08/13/2014 142  136 - 145 mEq/L Final  . Potassium 08/13/2014 3.2* 3.5 - 5.1 mEq/L Final  . Chloride 08/13/2014 105  98 - 109 mEq/L Final  . CO2 08/13/2014 24  22 - 29 mEq/L Final  . Glucose 08/13/2014 127  70 - 140 mg/dl Final  . BUN 08/13/2014 4.7* 7.0 - 26.0 mg/dL Final  . Creatinine 08/13/2014 1.2* 0.6 - 1.1 mg/dL Final  . Total Bilirubin 08/13/2014 0.72  0.20 - 1.20 mg/dL Final  . Alkaline Phosphatase 08/13/2014 70  40 - 150 U/L Final  . AST 08/13/2014 18  5 - 34 U/L Final  . ALT 08/13/2014 6  0 - 55 U/L Final  . Total Protein 08/13/2014 5.8* 6.4 - 8.3 g/dL Final  . Albumin 08/13/2014 3.1* 3.5 - 5.0 g/dL Final  . Calcium 08/13/2014 8.9  8.4 - 10.4 mg/dL Final  .  Anion Gap 08/13/2014 12* 3 - 11 mEq/L Final  . EGFR 08/13/2014 48* >90 ml/min/1.73 m2 Final   eGFR is calculated using the CKD-EPI Creatinine Equation (2009)     RADIOGRAPHIC STUDIES: No results found.  ASSESSMENT/PLAN:    Renal insufficiency Creatinine elevated to 1.2 today.  Previously creatinine 0.94.  Most likely, elevated creatinine secondary to continued dehydration.  Will continue to monitor closely.   Hypokalemia Potassium is 3.2 today.  Patient has not had diarrhea within the past 24 hours; but does remain fairly dehydrated.  Patient already has potassium 30 meq per day prescribed; but the patient has only been taking 20 mEq of potassium per day.  Patient was encouraged to return home this afternoon; and take potassium 30 mEq today as previously prescribed.Pt also encouraged to push a potassium rich diet.    Diarrhea Patient has been diagnosed with positive C. Difficile in the past; and continues to take Flagyl as previously prescribed.  Patient states she has not had a diarrhea episode within the past 24 hours; and feels that her diarrhea issues are slowly resolving.  She does however continue to feel dehydrated; and will receive a rehydration while at the [facility] today.  She denies any recent fevers or chills.   Dehydration She was diagnosed with positive stool for C. difficile recently; and continues with Flagyl antibiotics.  She has been coming to the [facility] for additional IV fluid rehydration several times per week.   Pt denies any further vomiting; and  is reporting only one diarrhea episode within the past 24 hours since initiating the Flagyl.  Patient will receive one maternal saline IV fluid rehydration while at the cancer Center today.  She was also encouraged to push fluids is much as possible.  She was advised to call if she feels she needs further IV fluid rehydration.    Breast cancer of upper-outer quadrant of right female breast Patient received  cycle 2, day 8 of her Abraxane chemotherapy on 07/27/2014.  Patient was scheduled for cycle 2, day 15 of the same regimen on 08/10/2014; but missed this chemotherapy since she was admitted to the hospital for dehydration.  Patient has plans to return for a followup visit and her next chemotherapy on 08/17/2014.          Patient stated understanding of all instructions; and was in agreement with this plan of care. The patient knows to call the clinic with any problems, questions or concerns.   Review/collaboration with Dr. Lindi Adie regarding all aspects of patient's visit today.   Total time spent with patient was 25 minutes;  with greater than 75 percent of that time spent in face to face counseling regarding patient's symptoms,  and coordination of care and follow up.  Disclaimer: This note was dictated with voice recognition software. Similar sounding words can inadvertently be transcribed and may not be corrected upon review.   Drue Second, NP 08/14/2014

## 2014-08-14 NOTE — Assessment & Plan Note (Signed)
Patient has been diagnosed with positive C. Difficile in the past; and continues to take Flagyl as previously prescribed.  Patient states she has not had a diarrhea episode within the past 24 hours; and feels that her diarrhea issues are slowly resolving.  She does however continue to feel dehydrated; and will receive a rehydration while at the [facility] today.  She denies any recent fevers or chills.

## 2014-08-14 NOTE — Assessment & Plan Note (Signed)
Patient received cycle 2, day 8 of her Abraxane chemotherapy on 07/27/2014.  Patient was scheduled for cycle 2, day 15 of the same regimen on 08/10/2014; but missed this chemotherapy since she was admitted to the hospital for dehydration.  Patient has plans to return for a followup visit and her next chemotherapy on 08/17/2014.

## 2014-08-14 NOTE — Assessment & Plan Note (Addendum)
Potassium is 3.2 today.  Patient has not had diarrhea within the past 24 hours; but does remain fairly dehydrated.  Patient already has potassium 30 meq per day prescribed; but the patient has only been taking 20 mEq of potassium per day.  Patient was encouraged to return home this afternoon; and take potassium 30 mEq today as previously prescribed.Pt also encouraged to push a potassium rich diet.

## 2014-08-14 NOTE — Assessment & Plan Note (Signed)
She was diagnosed with positive stool for C. difficile recently; and continues with Flagyl antibiotics.  She has been coming to the [facility] for additional IV fluid rehydration several times per week.   Pt denies any further vomiting; and is reporting only one diarrhea episode within the past 24 hours since initiating the Flagyl.  Patient will receive one maternal saline IV fluid rehydration while at the cancer Center today.  She was also encouraged to push fluids is much as possible.  She was advised to call if she feels she needs further IV fluid rehydration.

## 2014-08-17 ENCOUNTER — Emergency Department (HOSPITAL_COMMUNITY): Payer: Medicare Other

## 2014-08-17 ENCOUNTER — Other Ambulatory Visit: Payer: Self-pay

## 2014-08-17 ENCOUNTER — Inpatient Hospital Stay (HOSPITAL_COMMUNITY)
Admission: EM | Admit: 2014-08-17 | Discharge: 2014-08-19 | DRG: 280 | Disposition: A | Discharge status: Home / Self Care | Payer: Medicare Other | Attending: Internal Medicine | Admitting: Internal Medicine

## 2014-08-17 ENCOUNTER — Ambulatory Visit: Payer: Medicare Other

## 2014-08-17 ENCOUNTER — Other Ambulatory Visit (HOSPITAL_COMMUNITY): Payer: Self-pay

## 2014-08-17 ENCOUNTER — Ambulatory Visit: Payer: Medicare Other | Admitting: Hematology and Oncology

## 2014-08-17 ENCOUNTER — Encounter (HOSPITAL_COMMUNITY): Payer: Self-pay | Admitting: *Deleted

## 2014-08-17 DIAGNOSIS — I340 Nonrheumatic mitral (valve) insufficiency: Secondary | ICD-10-CM | POA: Diagnosis present

## 2014-08-17 DIAGNOSIS — I129 Hypertensive chronic kidney disease with stage 1 through stage 4 chronic kidney disease, or unspecified chronic kidney disease: Secondary | ICD-10-CM | POA: Diagnosis present

## 2014-08-17 DIAGNOSIS — E43 Unspecified severe protein-calorie malnutrition: Secondary | ICD-10-CM | POA: Diagnosis present

## 2014-08-17 DIAGNOSIS — I248 Other forms of acute ischemic heart disease: Secondary | ICD-10-CM | POA: Diagnosis present

## 2014-08-17 DIAGNOSIS — R7402 Elevation of levels of lactic acid dehydrogenase (LDH): Secondary | ICD-10-CM

## 2014-08-17 DIAGNOSIS — R404 Transient alteration of awareness: Secondary | ICD-10-CM | POA: Diagnosis not present

## 2014-08-17 DIAGNOSIS — I10 Essential (primary) hypertension: Secondary | ICD-10-CM | POA: Diagnosis not present

## 2014-08-17 DIAGNOSIS — I4581 Long QT syndrome: Secondary | ICD-10-CM | POA: Diagnosis present

## 2014-08-17 DIAGNOSIS — Z823 Family history of stroke: Secondary | ICD-10-CM | POA: Diagnosis not present

## 2014-08-17 DIAGNOSIS — Z90710 Acquired absence of both cervix and uterus: Secondary | ICD-10-CM

## 2014-08-17 DIAGNOSIS — R197 Diarrhea, unspecified: Secondary | ICD-10-CM | POA: Diagnosis present

## 2014-08-17 DIAGNOSIS — I214 Non-ST elevation (NSTEMI) myocardial infarction: Secondary | ICD-10-CM | POA: Diagnosis present

## 2014-08-17 DIAGNOSIS — I5043 Acute on chronic combined systolic (congestive) and diastolic (congestive) heart failure: Secondary | ICD-10-CM | POA: Diagnosis present

## 2014-08-17 DIAGNOSIS — A419 Sepsis, unspecified organism: Secondary | ICD-10-CM | POA: Diagnosis not present

## 2014-08-17 DIAGNOSIS — R7989 Other specified abnormal findings of blood chemistry: Secondary | ICD-10-CM | POA: Diagnosis not present

## 2014-08-17 DIAGNOSIS — Z79899 Other long term (current) drug therapy: Secondary | ICD-10-CM | POA: Diagnosis not present

## 2014-08-17 DIAGNOSIS — F329 Major depressive disorder, single episode, unspecified: Secondary | ICD-10-CM | POA: Diagnosis present

## 2014-08-17 DIAGNOSIS — A047 Enterocolitis due to Clostridium difficile: Secondary | ICD-10-CM | POA: Diagnosis present

## 2014-08-17 DIAGNOSIS — Z6829 Body mass index (BMI) 29.0-29.9, adult: Secondary | ICD-10-CM

## 2014-08-17 DIAGNOSIS — I5021 Acute systolic (congestive) heart failure: Secondary | ICD-10-CM

## 2014-08-17 DIAGNOSIS — T451X5A Adverse effect of antineoplastic and immunosuppressive drugs, initial encounter: Secondary | ICD-10-CM | POA: Diagnosis present

## 2014-08-17 DIAGNOSIS — Z885 Allergy status to narcotic agent status: Secondary | ICD-10-CM

## 2014-08-17 DIAGNOSIS — I959 Hypotension, unspecified: Secondary | ICD-10-CM | POA: Diagnosis not present

## 2014-08-17 DIAGNOSIS — Z87891 Personal history of nicotine dependence: Secondary | ICD-10-CM

## 2014-08-17 DIAGNOSIS — R000 Tachycardia, unspecified: Secondary | ICD-10-CM | POA: Diagnosis not present

## 2014-08-17 DIAGNOSIS — I471 Supraventricular tachycardia, unspecified: Secondary | ICD-10-CM

## 2014-08-17 DIAGNOSIS — E785 Hyperlipidemia, unspecified: Secondary | ICD-10-CM | POA: Diagnosis present

## 2014-08-17 DIAGNOSIS — N390 Urinary tract infection, site not specified: Secondary | ICD-10-CM | POA: Diagnosis present

## 2014-08-17 DIAGNOSIS — Z881 Allergy status to other antibiotic agents status: Secondary | ICD-10-CM | POA: Diagnosis not present

## 2014-08-17 DIAGNOSIS — E780 Pure hypercholesterolemia: Secondary | ICD-10-CM | POA: Diagnosis present

## 2014-08-17 DIAGNOSIS — D638 Anemia in other chronic diseases classified elsewhere: Secondary | ICD-10-CM | POA: Diagnosis present

## 2014-08-17 DIAGNOSIS — Z882 Allergy status to sulfonamides status: Secondary | ICD-10-CM | POA: Diagnosis not present

## 2014-08-17 DIAGNOSIS — R778 Other specified abnormalities of plasma proteins: Secondary | ICD-10-CM | POA: Diagnosis present

## 2014-08-17 DIAGNOSIS — I4510 Unspecified right bundle-branch block: Secondary | ICD-10-CM | POA: Diagnosis present

## 2014-08-17 DIAGNOSIS — A0472 Enterocolitis due to Clostridium difficile, not specified as recurrent: Secondary | ICD-10-CM | POA: Diagnosis present

## 2014-08-17 DIAGNOSIS — D6481 Anemia due to antineoplastic chemotherapy: Secondary | ICD-10-CM | POA: Diagnosis not present

## 2014-08-17 DIAGNOSIS — I517 Cardiomegaly: Secondary | ICD-10-CM | POA: Diagnosis not present

## 2014-08-17 DIAGNOSIS — I472 Ventricular tachycardia: Secondary | ICD-10-CM

## 2014-08-17 DIAGNOSIS — Z888 Allergy status to other drugs, medicaments and biological substances status: Secondary | ICD-10-CM

## 2014-08-17 DIAGNOSIS — F419 Anxiety disorder, unspecified: Secondary | ICD-10-CM | POA: Diagnosis present

## 2014-08-17 DIAGNOSIS — N183 Chronic kidney disease, stage 3 unspecified: Secondary | ICD-10-CM | POA: Diagnosis present

## 2014-08-17 DIAGNOSIS — B961 Klebsiella pneumoniae [K. pneumoniae] as the cause of diseases classified elsewhere: Secondary | ICD-10-CM | POA: Diagnosis present

## 2014-08-17 DIAGNOSIS — E860 Dehydration: Secondary | ICD-10-CM | POA: Diagnosis present

## 2014-08-17 DIAGNOSIS — C50411 Malignant neoplasm of upper-outer quadrant of right female breast: Secondary | ICD-10-CM | POA: Diagnosis not present

## 2014-08-17 DIAGNOSIS — R740 Nonspecific elevation of levels of transaminase and lactic acid dehydrogenase [LDH]: Secondary | ICD-10-CM

## 2014-08-17 DIAGNOSIS — R112 Nausea with vomiting, unspecified: Secondary | ICD-10-CM | POA: Diagnosis present

## 2014-08-17 DIAGNOSIS — R531 Weakness: Secondary | ICD-10-CM | POA: Diagnosis not present

## 2014-08-17 DIAGNOSIS — Z87442 Personal history of urinary calculi: Secondary | ICD-10-CM | POA: Diagnosis not present

## 2014-08-17 DIAGNOSIS — Z8249 Family history of ischemic heart disease and other diseases of the circulatory system: Secondary | ICD-10-CM

## 2014-08-17 DIAGNOSIS — R42 Dizziness and giddiness: Secondary | ICD-10-CM | POA: Diagnosis not present

## 2014-08-17 DIAGNOSIS — J811 Chronic pulmonary edema: Secondary | ICD-10-CM | POA: Diagnosis not present

## 2014-08-17 DIAGNOSIS — I5033 Acute on chronic diastolic (congestive) heart failure: Secondary | ICD-10-CM

## 2014-08-17 LAB — CBC WITH DIFFERENTIAL/PLATELET
BASOS ABS: 0 10*3/uL (ref 0.0–0.1)
BASOS PCT: 1 % (ref 0–1)
EOS ABS: 0 10*3/uL (ref 0.0–0.7)
Eosinophils Relative: 1 % (ref 0–5)
HCT: 38.7 % (ref 36.0–46.0)
HEMOGLOBIN: 12.3 g/dL (ref 12.0–15.0)
Lymphocytes Relative: 26 % (ref 12–46)
Lymphs Abs: 1.1 10*3/uL (ref 0.7–4.0)
MCH: 28 pg (ref 26.0–34.0)
MCHC: 31.8 g/dL (ref 30.0–36.0)
MCV: 88.2 fL (ref 78.0–100.0)
Monocytes Absolute: 0.3 10*3/uL (ref 0.1–1.0)
Monocytes Relative: 8 % (ref 3–12)
NEUTROS PCT: 64 % (ref 43–77)
Neutro Abs: 2.8 10*3/uL (ref 1.7–7.7)
PLATELETS: 176 10*3/uL (ref 150–400)
RBC: 4.39 MIL/uL (ref 3.87–5.11)
RDW: 17.4 % — AB (ref 11.5–15.5)
WBC: 4.3 10*3/uL (ref 4.0–10.5)

## 2014-08-17 LAB — I-STAT TROPONIN, ED: Troponin i, poc: 0.32 ng/mL (ref 0.00–0.08)

## 2014-08-17 LAB — COMPREHENSIVE METABOLIC PANEL
ALT: 8 U/L (ref 0–35)
AST: 23 U/L (ref 0–37)
Albumin: 3.3 g/dL — ABNORMAL LOW (ref 3.5–5.2)
Alkaline Phosphatase: 57 U/L (ref 39–117)
Anion gap: 14 (ref 5–15)
BILIRUBIN TOTAL: 0.8 mg/dL (ref 0.3–1.2)
BUN: 6 mg/dL (ref 6–23)
CO2: 21 mmol/L (ref 19–32)
CREATININE: 1.33 mg/dL — AB (ref 0.50–1.10)
Calcium: 8.9 mg/dL (ref 8.4–10.5)
Chloride: 106 mmol/L (ref 96–112)
GFR calc non Af Amer: 40 mL/min — ABNORMAL LOW (ref >90)
GFR, EST AFRICAN AMERICAN: 46 mL/min — AB (ref >90)
Glucose, Bld: 174 mg/dL — ABNORMAL HIGH (ref 70–99)
Potassium: 3.5 mmol/L (ref 3.5–5.1)
Sodium: 141 mmol/L (ref 135–145)
Total Protein: 5.8 g/dL — ABNORMAL LOW (ref 6.0–8.3)

## 2014-08-17 LAB — TSH: TSH: 1.792 u[IU]/mL (ref 0.350–4.500)

## 2014-08-17 LAB — URINALYSIS, ROUTINE W REFLEX MICROSCOPIC
BILIRUBIN URINE: NEGATIVE
GLUCOSE, UA: NEGATIVE mg/dL
Ketones, ur: NEGATIVE mg/dL
Leukocytes, UA: NEGATIVE
Nitrite: NEGATIVE
PH: 7 (ref 5.0–8.0)
Protein, ur: NEGATIVE mg/dL
SPECIFIC GRAVITY, URINE: 1.006 (ref 1.005–1.030)
Urobilinogen, UA: 0.2 mg/dL (ref 0.0–1.0)

## 2014-08-17 LAB — MAGNESIUM: Magnesium: 1.5 mg/dL (ref 1.5–2.5)

## 2014-08-17 LAB — BRAIN NATRIURETIC PEPTIDE: B Natriuretic Peptide: 1980.9 pg/mL — ABNORMAL HIGH (ref 0.0–100.0)

## 2014-08-17 LAB — URINE MICROSCOPIC-ADD ON

## 2014-08-17 LAB — I-STAT CG4 LACTIC ACID, ED: LACTIC ACID, VENOUS: 4.27 mmol/L — AB (ref 0.5–2.0)

## 2014-08-17 LAB — PHOSPHORUS: PHOSPHORUS: 3.3 mg/dL (ref 2.3–4.6)

## 2014-08-17 LAB — TROPONIN I
TROPONIN I: 6.39 ng/mL — AB (ref <0.031)
Troponin I: 8.82 ng/mL (ref <0.031)
Troponin I: 8.02 ng/mL (ref <0.031)

## 2014-08-17 LAB — MRSA PCR SCREENING: MRSA by PCR: NEGATIVE

## 2014-08-17 LAB — CLOSTRIDIUM DIFFICILE BY PCR: Toxigenic C. Difficile by PCR: POSITIVE — AB

## 2014-08-17 LAB — LACTIC ACID, PLASMA: LACTIC ACID, VENOUS: 1.7 mmol/L (ref 0.5–2.0)

## 2014-08-17 MED ORDER — ONDANSETRON HCL 4 MG/2ML IJ SOLN
4.0000 mg | Freq: Four times a day (QID) | INTRAMUSCULAR | Status: DC | PRN
  Administered 2014-08-17 – 2014-08-19 (×5): 4 mg via INTRAVENOUS
  Filled 2014-08-17 (×5): qty 2

## 2014-08-17 MED ORDER — METRONIDAZOLE IN NACL 5-0.79 MG/ML-% IV SOLN
500.0000 mg | Freq: Three times a day (TID) | INTRAVENOUS | Status: DC
  Administered 2014-08-17 – 2014-08-19 (×7): 500 mg via INTRAVENOUS
  Filled 2014-08-17 (×7): qty 100

## 2014-08-17 MED ORDER — MAGNESIUM OXIDE 400 (241.3 MG) MG PO TABS
400.0000 mg | ORAL_TABLET | Freq: Once | ORAL | Status: AC
  Administered 2014-08-17: 400 mg via ORAL
  Filled 2014-08-17: qty 1

## 2014-08-17 MED ORDER — ETOMIDATE 2 MG/ML IV SOLN
INTRAVENOUS | Status: AC
  Administered 2014-08-17: 11 mg
  Filled 2014-08-17: qty 20

## 2014-08-17 MED ORDER — PROBIOTIC DAILY PO CAPS: ORAL_CAPSULE | Freq: Every day | ORAL | Status: DC

## 2014-08-17 MED ORDER — ENOXAPARIN SODIUM 40 MG/0.4ML ~~LOC~~ SOLN
40.0000 mg | SUBCUTANEOUS | Status: DC
  Administered 2014-08-17 – 2014-08-19 (×3): 40 mg via SUBCUTANEOUS
  Filled 2014-08-17 (×3): qty 0.4

## 2014-08-17 MED ORDER — CETYLPYRIDINIUM CHLORIDE 0.05 % MT LIQD
7.0000 mL | Freq: Two times a day (BID) | OROMUCOSAL | Status: DC
  Administered 2014-08-17 – 2014-08-18 (×4): 7 mL via OROMUCOSAL

## 2014-08-17 MED ORDER — VITAMIN D 1000 UNITS PO TABS
1000.0000 [IU] | ORAL_TABLET | Freq: Every day | ORAL | Status: DC
  Administered 2014-08-17 – 2014-08-19 (×3): 1000 [IU] via ORAL
  Filled 2014-08-17 (×3): qty 1

## 2014-08-17 MED ORDER — SODIUM CHLORIDE 0.9 % IJ SOLN
3.0000 mL | Freq: Two times a day (BID) | INTRAMUSCULAR | Status: DC
  Administered 2014-08-17 – 2014-08-19 (×5): 3 mL via INTRAVENOUS

## 2014-08-17 MED ORDER — ACETAMINOPHEN 650 MG RE SUPP: 650.0000 mg | Freq: Four times a day (QID) | RECTAL | Status: DC | PRN

## 2014-08-17 MED ORDER — ACETAMINOPHEN 325 MG PO TABS
650.0000 mg | ORAL_TABLET | Freq: Four times a day (QID) | ORAL | Status: DC | PRN
  Administered 2014-08-18: 325 mg via ORAL
  Filled 2014-08-17: qty 2

## 2014-08-17 MED ORDER — SUCCINYLCHOLINE CHLORIDE 20 MG/ML IJ SOLN
INTRAMUSCULAR | Status: AC
  Filled 2014-08-17: qty 1

## 2014-08-17 MED ORDER — ONDANSETRON HCL 4 MG/2ML IJ SOLN
INTRAMUSCULAR | Status: AC
  Administered 2014-08-17: 06:00:00
  Filled 2014-08-17: qty 2

## 2014-08-17 MED ORDER — SIMETHICONE 80 MG PO CHEW
80.0000 mg | CHEWABLE_TABLET | Freq: Four times a day (QID) | ORAL | Status: DC | PRN
  Filled 2014-08-17: qty 1

## 2014-08-17 MED ORDER — METOPROLOL TARTRATE 25 MG PO TABS
25.0000 mg | ORAL_TABLET | Freq: Two times a day (BID) | ORAL | Status: DC
  Administered 2014-08-17 – 2014-08-19 (×5): 25 mg via ORAL
  Filled 2014-08-17 (×5): qty 1

## 2014-08-17 MED ORDER — FLUOXETINE HCL 10 MG PO CAPS
10.0000 mg | ORAL_CAPSULE | Freq: Every day | ORAL | Status: DC
  Administered 2014-08-17 – 2014-08-19 (×3): 10 mg via ORAL
  Filled 2014-08-17 (×4): qty 1

## 2014-08-17 MED ORDER — SODIUM CHLORIDE 0.9 % IV BOLUS (SEPSIS)
1000.0000 mL | Freq: Once | INTRAVENOUS | Status: AC
  Administered 2014-08-17: 1000 mL via INTRAVENOUS

## 2014-08-17 MED ORDER — ONDANSETRON HCL 4 MG/2ML IJ SOLN
4.0000 mg | Freq: Once | INTRAMUSCULAR | Status: AC
  Administered 2014-08-17: 4 mg via INTRAVENOUS
  Filled 2014-08-17: qty 2

## 2014-08-17 MED ORDER — METOPROLOL TARTRATE 1 MG/ML IV SOLN
INTRAVENOUS | Status: AC
  Administered 2014-08-17: 2.5 mg
  Filled 2014-08-17: qty 5

## 2014-08-17 MED ORDER — ONDANSETRON HCL 4 MG PO TABS: 4.0000 mg | ORAL_TABLET | Freq: Four times a day (QID) | ORAL | Status: DC | PRN

## 2014-08-17 MED ORDER — RISAQUAD PO CAPS
1.0000 | ORAL_CAPSULE | Freq: Every day | ORAL | Status: DC
  Administered 2014-08-17 – 2014-08-19 (×3): 1 via ORAL
  Filled 2014-08-17 (×3): qty 1

## 2014-08-17 MED ORDER — LIDOCAINE HCL (CARDIAC) 20 MG/ML IV SOLN
INTRAVENOUS | Status: AC
  Filled 2014-08-17: qty 5

## 2014-08-17 MED ORDER — LORAZEPAM 0.5 MG PO TABS
0.5000 mg | ORAL_TABLET | Freq: Three times a day (TID) | ORAL | Status: DC | PRN
  Administered 2014-08-17 – 2014-08-19 (×5): 0.5 mg via ORAL
  Filled 2014-08-17 (×6): qty 1

## 2014-08-17 MED ORDER — FUROSEMIDE 10 MG/ML IJ SOLN
40.0000 mg | Freq: Two times a day (BID) | INTRAMUSCULAR | Status: DC
  Administered 2014-08-17 – 2014-08-19 (×5): 40 mg via INTRAVENOUS
  Filled 2014-08-17 (×5): qty 4

## 2014-08-17 MED ORDER — ROCURONIUM BROMIDE 50 MG/5ML IV SOLN
INTRAVENOUS | Status: AC
  Filled 2014-08-17: qty 2

## 2014-08-17 MED ORDER — SODIUM CHLORIDE 0.9 % IV SOLN
INTRAVENOUS | Status: DC
  Administered 2014-08-17: 09:00:00 via INTRAVENOUS

## 2014-08-17 MED ORDER — SODIUM CHLORIDE 0.9 % IV BOLUS (SEPSIS)
2000.0000 mL | Freq: Once | INTRAVENOUS | Status: AC
  Administered 2014-08-17: 2000 mL via INTRAVENOUS

## 2014-08-17 MED ORDER — POTASSIUM CHLORIDE 20 MEQ/15ML (10%) PO SOLN
60.0000 meq | Freq: Once | ORAL | Status: DC
  Filled 2014-08-17: qty 45

## 2014-08-17 MED ORDER — POTASSIUM CHLORIDE CRYS ER 20 MEQ PO TBCR
60.0000 meq | EXTENDED_RELEASE_TABLET | Freq: Once | ORAL | Status: AC
  Administered 2014-08-17: 60 meq via ORAL
  Filled 2014-08-17: qty 3

## 2014-08-17 MED ORDER — DEXTROSE 5 % IV SOLN
1.0000 g | INTRAVENOUS | Status: DC
  Administered 2014-08-17: 1 g via INTRAVENOUS
  Filled 2014-08-17: qty 10

## 2014-08-17 NOTE — Progress Notes (Addendum)
CRITICAL VALUE ALERT  Critical value received:  6067 Troponin 6.39  Date of notification:  08/17/14  Time of notification:  0956  Critical value read back: yes  Nurse who received alert:  Marnee Guarneri, RN  MD notified (1st page):  Dr. Angelena Form  (Cardiology)- called back immediately and updated.

## 2014-08-17 NOTE — Progress Notes (Signed)
INITIAL NUTRITION ASSESSMENT  DOCUMENTATION CODES Per approved criteria  -Obesity Unspecified -Severe Malnutrition in acute illness.   Pt meets criteria for severe MALNUTRITION in the context of acute illness as evidenced by po intake <50% of needs for >5 days and >2% wt loss in 1 week.  INTERVENTION: - Once diet advanced, add Resource Breeze po TID, each supplement provides 250 kcal and 9 grams of protein - RD will continue to monitor  NUTRITION DIAGNOSIS: Inadequate oral intake related to inability to eat as evidenced by NPO.   Goal: Pt to meet >/= 90% of their estimated nutrition needs   Monitor:  Weight trend, NPO, labs  Reason for Assessment: Malnutrition Screening Tool  69 y.o. female  Admitting Dx: Diarrhea  ASSESSMENT: 69 y.o. female with breast cancer, receiving chemo, chronic nausea, recent c diff colitis who comes in for vomiting large amounts liquids x 2 today. She has been on a liquid diet for 2 wks. She was diagnosed with C diff colitis a little over a week ago. She states that her cancer doctor is aware that she is only on liquids. She received IVF at the cancer center 3 x last week on consecutive days then followed by a day of Lasix as she was thought to have gotten fluid overloaded- her shortness of breath did go away with Lasix.   - Pt in room using bedside commode during RD visit. Nutritional history obtained from RN who was assisting pt.  - Pt reports >20 lb wt loss since the start of cancer treatment. Unknown usual body weight.  - Currently NPO. C Diff + - Pt reports that she has been on clear liquids for >2 weeks. She likes [place] ice.  - Has tried Ensure and Boost supplements, but reports that they make her vomit. Will try clear liquid supplements once diet advanced.  - Unable to perform nutrition focused physical exam at this time. Will attempt at follow-up.  - Labs and medications reviewed  Height: Ht Readings from Last 1 Encounters:  08/17/14 5\' 3"   (1.6 m)    Weight: Wt Readings from Last 1 Encounters:  08/17/14 170 lb (77.111 kg)    Ideal Body Weight: 52.4 kg  % Ideal Body Weight: 147%  Wt Readings from Last 10 Encounters:  08/17/14 170 lb (77.111 kg)  08/13/14 171 lb 14.4 oz (77.973 kg)  08/09/14 175 lb 1.6 oz (79.425 kg)  07/30/14 175 lb 1.6 oz (79.425 kg)  07/27/14 177 lb 9.6 oz (80.559 kg)  07/20/14 176 lb 11.2 oz (80.151 kg)  07/06/14 180 lb 3.2 oz (81.738 kg)  07/01/14 169 lb 4.8 oz (76.794 kg)  06/22/14 184 lb 1.6 oz (83.507 kg)  06/08/14 183 lb 6.4 oz (83.19 kg)    BMI:  Body mass index is 30.12 kg/(m^2).  Estimated Nutritional Needs: Kcal: 1700-1900 Protein: 100-115 g Fluid: >1.9 L/day  Skin: Intact  Diet Order: Diet NPO time specified  EDUCATION NEEDS: -No education needs identified at this time   Intake/Output Summary (Last 24 hours) at 08/17/14 1358 Last data filed at 08/17/14 1300  Gross per 24 hour  Intake   5213 ml  Output    851 ml  Net   4362 ml    Last BM: 4/4   Labs:   Recent Labs Lab 08/13/14 1012 08/17/14 0439 08/17/14 0444  NA 142 141  --   K 3.2* 3.5  --   CL  --  106  --   CO2 24 21  --  BUN 4.7* 6  --   CREATININE 1.2* 1.33*  --   CALCIUM 8.9 8.9  --   MG  --   --  1.5  PHOS  --   --  3.3  GLUCOSE 127 174*  --     CBG (last 3)  No results for input(s): GLUCAP in the last 72 hours.  Scheduled Meds: . acidophilus  1 capsule Oral Daily  . antiseptic oral rinse  7 mL Mouth Rinse BID  . cholecalciferol  1,000 Units Oral Daily  . enoxaparin (LOVENOX) injection  40 mg Subcutaneous Q24H  . FLUoxetine  10 mg Oral Daily  . furosemide  40 mg Intravenous Q12H  . lidocaine (cardiac) 100 mg/61ml      . metoprolol tartrate  25 mg Oral BID  . metronidazole  500 mg Intravenous Q8H  . rocuronium      . sodium chloride  3 mL Intravenous Q12H  . succinylcholine        Continuous Infusions: . sodium chloride 10 mL/hr at 08/17/14 2440    Past Medical History   Diagnosis Date  . Anxiety   . Back pain   . H/O bladder problems   . Cholelithiasis   . Depression   . High blood pressure   . Hypercholesteremia   . Kidney stones   . Gum disease   . Complication of anesthesia     bp goes up  . Wears glasses   . Wears dentures     top    Past Surgical History  Procedure Laterality Date  . Colon biopsy    . Mouth surgery    . Spine surgery    . Colonoscopy    . Tubal ligation    . Lithotripsy    . Rhinoplasty    . Back surgery  1989    lumb lam  . Breast biopsy Right 02/05/2014    invasive ductal cncer  . Abdominal hysterectomy  1982  . Cholecystectomy  2011    lap choli  . Radioactive seed guided mastectomy with axillary sentinel lymph node biopsy Right 02/27/2014    Procedure: RADIOACTIVE SEED GUIDED RIGHT PARTIAL MASTECTOMY WITH AXILLARY SENTINEL LYMPH NODE BIOPSY;  Surgeon: Stark Klein, MD;  Location: [facility];  Service: General;  Laterality: Right;  . Portacath placement N/A 02/27/2014    Procedure: INSERTION PORT-A-CATH;  Surgeon: Stark Klein, MD;  Location: [facility];  Service: General;  Laterality: N/A;    Laurette Schimke [place], [place], [place]

## 2014-08-17 NOTE — Plan of Care (Signed)
Problem: Consults Goal: Nutrition Consult-if indicated Outcome: Completed/Met Date Met:  08/17/14 Pt cannot tolerate anything more than clear liquids at this time.  Pt has been taking only clear liquids for over 2 weeks. See dietitian note.  Problem: Phase I Progression Outcomes Goal: Voiding-avoid urinary catheter unless indicated Outcome: Completed/Met Date Met:  08/17/14 Pt refuses catheter, did have one in ED and had removed due to discomfort.  Problem: Phase II Progression Outcomes Goal: Discharge plan established Outcome: Progressing Pt verbalizes that she feels that she might need more assistance and may not be able to live alone anymore.

## 2014-08-17 NOTE — H&P (Addendum)
[facility] History and Physical  Cheyenne Riggs XIH:[id number] DOB: 09/02/1945 DOA: 08/17/2014  Referring physician: ER physician PCP: Gennette Pac, MD   Chief Complaint: weakness  HPI:  69 -year-old female with past medical history of breast cancer on chemotherapy (follows with Dr. Lindi Adie), hypertension, dyslipidemia, diastolic CHF (last 2 D ECHO in 06/2014 with normal LV function), recent diagnosis of C. Difficile (has not fully completed Flagyl, had 2 days left to complete). Patient presented to [facility] long ED with complaints of worsening weakness, ongoing diarrhea/loose stools. No reports of fevers at home. No reports of nausea or vomiting. No other complaints such as chest pain or shortness of breath or palpitations. No loss of consciousness.  On admission, patient was hypotensive with blood pressure 72/57 which has subsequently improved 235/71. Heart rate was as high as 194, respiratory rate 28. She was afebrile and with oxygen saturation 94% on room air. Blood work was unremarkable except for mild elevation in creatinine, 1.33. The 12-lead EKG in ED showed a wide-complex tachycardia. She was cardioverted in emergency room.  Currently she is hemodynamically stable. She is admitted to stepdown unit and she was started on IV Flagyl for C. difficile.  Assessment & Plan    Principal Problem: Enteritis due to Clostridium difficile - Patient with recent diagnosis of C. difficile, she was on Flagyl prior to the admission. Her C. difficile by PCR is still positive on this admission. - Continue treatment with IV Flagyl.  - Enteric precautions - Continue acidophilus supplementation   Active Problems: Weakness / wide-complex tachycardia - Patient cardioverted in ED. Now hemodynamically stable. - Appreciate cardiology consult and recommendations. Per cardio, this is most likely SVT with aberrancy. Per cardio, start low dose beta blocker - Follow up 2 D ECHO on this admission    Breast cancer of upper-outer quadrant of right female breast - Follows with Dr. Lindi Adie outpatient - Dr. Lindi Adie informed of patient's admission.  Acute on chronic diastolic CHF - Last 2 D ECHO in 06/2014 with preserved LV function - Follow up 2 D ECHO on this admission - BNP on this admission 1981. - Per cardio, started lasix 40 mg IV Q 12 hours - Daily weight and replete electrolytes as needed, strict intake and output  Troponin elevation - Likely demand ischemia form wide complex tachycardia - No complaints of chest pain  - Cycle cardiac enzymes   Prolonged QTc - Replace potassium and magnesium.  Severe protein calorie malnutrition - Nutrition consulted  DVT prophylaxis - Lovenox subQ    Radiological Exams on Admission: Dg Chest Port 1 View  08/17/2014   Worsening moderate cardiomegaly with pulmonary vascular congestion interstitial prominence most consistent with pulmonary edema, confluent in the LEFT lung base with small LEFT pleural effusion.   Electronically Signed   By: Elon Alas   On: 08/17/2014 06:10    EKG: wide complex tachycardia   Code Status: Full Family Communication: Plan of care discussed with the patient  Disposition Plan: Admit for further evaluation  Leisa Lenz, MD  [facility] Pager [phone]  Review of Systems:  Constitutional: Negative for fever, chills and malaise/fatigue. Negative for diaphoresis.  HENT: Negative for hearing loss, ear pain, nosebleeds, congestion, sore throat, neck pain, tinnitus and ear discharge.   Eyes: Negative for blurred vision, double vision, photophobia, pain, discharge and redness.  Respiratory: Negative for cough, hemoptysis, sputum production, shortness of breath, wheezing and stridor.   Cardiovascular: Negative for chest pain, palpitations, orthopnea, claudication and leg swelling.  Gastrointestinal:  per HPI.  Genitourinary: Negative for dysuria, urgency, frequency, hematuria and flank pain.   Musculoskeletal: Negative for myalgias, back pain, joint pain and falls.  Skin: Negative for itching and rash.  Neurological: Negative for dizziness and positive for weakness. Negative for tingling, tremors, sensory change, speech change, focal weakness, loss of consciousness and headaches.  Endo/Heme/Allergies: Negative for environmental allergies and polydipsia. Does not bruise/bleed easily.  Psychiatric/Behavioral: Negative for suicidal ideas. The patient is not nervous/anxious.      Past Medical History  Diagnosis Date  . Anxiety   . Back pain   . H/O bladder problems   . Cholelithiasis   . Depression   . High blood pressure   . Hypercholesteremia   . Kidney stones   . Gum disease   . Complication of anesthesia     bp goes up  . Wears glasses   . Wears dentures     top   Past Surgical History  Procedure Laterality Date  . Colon biopsy    . Mouth surgery    . Spine surgery    . Colonoscopy    . Tubal ligation    . Lithotripsy    . Rhinoplasty    . Back surgery  1989    lumb lam  . Breast biopsy Right 02/05/2014    invasive ductal cncer  . Abdominal hysterectomy  1982  . Cholecystectomy  2011    lap choli  . Radioactive seed guided mastectomy with axillary sentinel lymph node biopsy Right 02/27/2014    Procedure: RADIOACTIVE SEED GUIDED RIGHT PARTIAL MASTECTOMY WITH AXILLARY SENTINEL LYMPH NODE BIOPSY;  Surgeon: Stark Klein, MD;  Location: [facility];  Service: General;  Laterality: Right;  . Portacath placement N/A 02/27/2014    Procedure: INSERTION PORT-A-CATH;  Surgeon: Stark Klein, MD;  Location: [facility];  Service: General;  Laterality: N/A;   Social History:  reports that she quit smoking about 3 months ago. She does not have any smokeless tobacco history on file. She reports that she does not drink alcohol or use illicit drugs.  Allergies  Allergen Reactions  . Ciprofloxacin Nausea And Vomiting  . Demerol [Meperidine] Hives   . Lidocaine     palpatations  . Other Swelling    Eye ointments  . Septra [Sulfamethoxazole-Trimethoprim] Nausea And Vomiting  . Codeine Rash  . Novocain [Procaine] Palpitations    Family History:  Family History  Problem Relation Age of Onset  . Stroke Mother     onset in her 74's  . Cancer - Other Father     eye cancer, black lung diseae  . Heart failure Sister   . Hypertension Brother      Prior to Admission medications   Medication Sig Start Date End Date Taking? Authorizing Provider  acetaminophen (TYLENOL) 325 MG tablet Take 650 mg by mouth every 6 (six) hours as needed for moderate pain (pain).    Yes Historical Provider, MD  cholecalciferol (VITAMIN D) 1000 UNITS tablet Take 1,000 Units by mouth daily.    Yes Historical Provider, MD  FLUoxetine (PROZAC) 10 MG capsule Take 1 capsule (10 mg total) by mouth daily. 05/26/14  Yes Hosie Poisson, MD  furosemide (LASIX) 40 MG tablet Take 1 tablet (40 mg total) by mouth daily as needed for fluid (Take a lasix 40 mg and Kdur 20 meq if your weight goes up 5 lbs). 07/01/14  Yes Donne Hazel, MD  lidocaine-prilocaine (EMLA) cream Apply 1 application topically once a  week. On Chemo Day (Monday). 03/27/14  Yes Historical Provider, MD  LORazepam (ATIVAN) 0.5 MG tablet Take 1 tablet (0.5 mg total) by mouth every 8 (eight) hours as needed. Nausea 08/11/14  Yes [facility] Lose, MD  ondansetron (ZOFRAN) 8 MG tablet Take 8 mg by mouth daily as needed for nausea (nausea).  03/30/14  Yes Historical Provider, MD  potassium chloride (MICRO-K) 10 MEQ CR capsule Take 3 capsules (30 mEq total) by mouth daily. 08/03/14  Yes [facility] Lose, MD  Probiotic Product (PROBIOTIC DAILY PO) Take 1 tablet by mouth daily.    Yes Historical Provider, MD  diphenhydrAMINE (BENADRYL) 25 MG tablet Take 25 mg by mouth daily as needed for allergies (allergies).     Historical Provider, MD  loperamide (IMODIUM) 2 MG capsule Take 2 mg by mouth daily as needed for diarrhea or  loose stools.    Historical Provider, MD  metroNIDAZOLE (FLAGYL) 500 MG tablet Take 1 tablet (500 mg total) by mouth 3 (three) times daily. One po bid x 15 days Patient not taking: Reported on 08/17/2014 08/01/14   Malvin Johns, MD   Physical Exam: Filed Vitals:   08/17/14 0645 08/17/14 0700 08/17/14 0800 08/17/14 1100  BP: 135/71 125/92 118/90 173/95  Pulse: 88 88 89 98  Temp:   97.5 F (36.4 C)   TempSrc:   Oral   Resp: 19 15 21 24   Height:      Weight:      SpO2: 99% 98% 97% 99%    Physical Exam  Constitutional: Appears well-developed and well-nourished. No distress.  HENT: Normocephalic. No tonsillar erythema or exudates Eyes: Conjunctivae and EOM are normal. PERRLA, no scleral icterus.  Neck: Normal ROM. Neck supple. No JVD. No tracheal deviation. No thyromegaly.  CVS: RRR, S1/S2 +, no murmurs, no gallops, no carotid bruit.  Pulmonary: Effort and breath sounds normal, no stridor, rhonchi, wheezes, rales.  Abdominal: Soft. BS +,  no distension, tenderness, rebound or guarding.  Musculoskeletal: Normal range of motion. No edema and no tenderness.  Lymphadenopathy: No lymphadenopathy noted, cervical, inguinal. Neuro: Alert. Normal reflexes, muscle tone coordination. No focal neurologic deficits. Skin: Skin is warm and dry. No rash noted.  No erythema. No pallor.  Psychiatric: Normal mood and affect. Behavior, judgment, thought content normal.   Labs on Admission:  Basic Metabolic Panel:  Recent Labs Lab 08/13/14 1012 08/17/14 0439 08/17/14 0444  NA 142 141  --   K 3.2* 3.5  --   CL  --  106  --   CO2 24 21  --   GLUCOSE 127 174*  --   BUN 4.7* 6  --   CREATININE 1.2* 1.33*  --   CALCIUM 8.9 8.9  --   MG  --   --  1.5  PHOS  --   --  3.3   Liver Function Tests:  Recent Labs Lab 08/13/14 1012 08/17/14 0439  AST 18 23  ALT 6 8  ALKPHOS 70 57  BILITOT 0.72 0.8  PROT 5.8* 5.8*  ALBUMIN 3.1* 3.3*   No results for input(s): LIPASE, AMYLASE in the last 168  hours. No results for input(s): AMMONIA in the last 168 hours. CBC:  Recent Labs Lab 08/13/14 1012 08/17/14 0439  WBC 4.9 4.3  NEUTROABS 3.6 2.8  HGB 11.7 12.3  HCT 37.6 38.7  MCV 86.7 88.2  PLT 222 176   Cardiac Enzymes:  Recent Labs Lab 08/17/14 0955  TROPONINI 6.39*   BNP: Invalid input(s): POCBNP CBG:  No results for input(s): GLUCAP in the last 168 hours.  If 7PM-7AM, please contact night-coverage [email or web address] Password [email or web address] 08/17/2014, 11:40 AM

## 2014-08-17 NOTE — Assessment & Plan Note (Signed)
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 Chemotherapy summary: Patient started systemic adjuvant chemotherapy with dose dense Adriamycin Cytoxan followed by Abraxane on 03/30/2014. Discontinued chemotherapy Cycle 6/12 of Abraxane (cycle 1 delayed due to hospitalization with dental infection, neutropenic fever and C. difficile colitis.) Due to recurrent C. difficile.  Chemotoxicities: 1. Neutropenia grade 4: Required dose reduction for cycle 2 of chemotherapy and for cycle 4 AC, hospitalization for neutropenic fever after fourth cycle of AC, dose reduction after cycle 1 of Abraxane. 2. Alopecia 3. Chemotherapy-induced nausea : Resolved 4. Chemotherapy-induced diarrhea: Patient went on to get C. difficile enterocolitis and was treated with Flagyl. 5. Chemotherapy-induced severe fatigue: Much improved with treatment break 6. Chemotherapy-induced anemia: Hemoglobin being monitored closely today it is 9.3 7. Hospitalization for neutropenic fever, dental infection with C. difficile colitis: After 4 cycles of Adriamycin and Cytoxan. She is currently off Flagyl and diarrhea has resolved. Currently takes probiotics 8. Hypokalemia: Patient might need oral potassium supplementation long-term. I attribute her hypokalemia to diarrhea.  9. Hospitalization for shortness of breath 07/01/2014 diagnosed with CHF treated with Lasix but EF was normal.  10 Rash on her arms left greater than right colon related to Abraxane fixed drug eruption. I recommended that she apply 1% hydrocortisone ointment twice a day.  11. Recurrent C. difficile: Hospitalization with diarrhea and nausea and vomiting 08/09/2014 2 08/10/2014   I discussed with the patient that we could stop her treatment early because of her recurrent C. difficile infections and profound nausea vomiting. She has been hospitalized several times for many of  these complaints. I believe that her general physical condition has weakened over the past month. Patient is agreeable about this. We will set her up for a breast MRI and follow surgery.

## 2014-08-17 NOTE — ED Provider Notes (Signed)
CSN: [id number]     Arrival date & time 08/17/14  0355 History   First MD Initiated Contact with Patient 08/17/14 0407     Chief Complaint  Patient presents with  . Weakness  . Diarrhea     The history is provided by the patient.   Patient presents emergency department with complaints of weakness.  She is a cancer patient getting chemotherapy.  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  Said diarrhea over the past 24 hours and this morning fell when transferring and was unable to get out.  She called EMS.  She denies weakness in her arms or legs at this time.  She denies chest pain.  She states that she does feel weak.  No shortness of breath.  No recent fevers or chills.  She denies blood in her stool but reports watery stools today.  She does have a history of congestive heart failure but no history of atrial fibrillation or atrial flutter.  She presents to the emergency department with hypotension with blood pressure in the 60s and a wide-complex tachycardia of 190.  She denies any palpitations at this time.  She denies head injury.  She denies pain in her hips.  She denies any pain from the fall itself.   Past Medical History  Diagnosis Date  . Anxiety   . Back pain   . H/O bladder problems   . Cholelithiasis   . Depression   . High blood pressure   . Hypercholesteremia   . Kidney stones   . Gum disease   . Complication of anesthesia     bp goes up  . Wears glasses   . Wears dentures     top   Past Surgical History  Procedure Laterality Date  . Colon biopsy    . Mouth surgery    . Spine surgery    . Colonoscopy    . Tubal ligation    . Lithotripsy    . Rhinoplasty    . Back surgery  1989    lumb lam  . Breast biopsy Right 02/05/2014    invasive ductal cncer  . Abdominal hysterectomy  1982  . Cholecystectomy  2011    lap choli  . Radioactive seed guided mastectomy with axillary sentinel lymph node biopsy Right 02/27/2014    Procedure: RADIOACTIVE SEED GUIDED RIGHT PARTIAL  MASTECTOMY WITH AXILLARY SENTINEL LYMPH NODE BIOPSY;  Surgeon: Stark Klein, MD;  Location: [facility];  Service: General;  Laterality: Right;  . Portacath placement N/A 02/27/2014    Procedure: INSERTION PORT-A-CATH;  Surgeon: Stark Klein, MD;  Location: [facility];  Service: General;  Laterality: N/A;   Family History  Problem Relation Age of Onset  . Stroke Mother     onset in her 26's  . Cancer - Other Father     eye cancer, black lung diseae  . Heart failure Sister   . Hypertension Brother    History  Substance Use Topics  . Smoking status: Former Smoker -- 0.00 packs/day    Quit date: 05/16/2014  . Smokeless tobacco: Not on file  . Alcohol Use: No   OB History    No data available     Review of Systems  All other systems reviewed and are negative.     Allergies  Ciprofloxacin; Demerol; Lidocaine; Other; Septra; Codeine; and Novocain  Home Medications   Prior to Admission medications   Medication Sig Start Date End Date Taking? Authorizing Provider  acetaminophen (  TYLENOL) 325 MG tablet Take 650 mg by mouth every 6 (six) hours as needed for moderate pain (pain).    Yes Historical Provider, MD  cholecalciferol (VITAMIN D) 1000 UNITS tablet Take 1,000 Units by mouth daily.    Yes Historical Provider, MD  FLUoxetine (PROZAC) 10 MG capsule Take 1 capsule (10 mg total) by mouth daily. 05/26/14  Yes Hosie Poisson, MD  furosemide (LASIX) 40 MG tablet Take 1 tablet (40 mg total) by mouth daily as needed for fluid (Take a lasix 40 mg and Kdur 20 meq if your weight goes up 5 lbs). 07/01/14  Yes Donne Hazel, MD  lidocaine-prilocaine (EMLA) cream Apply 1 application topically once a week. On Chemo Day (Monday). 03/27/14  Yes Historical Provider, MD  LORazepam (ATIVAN) 0.5 MG tablet Take 1 tablet (0.5 mg total) by mouth every 8 (eight) hours as needed. Nausea 08/11/14  Yes Nicholas Lose, MD  ondansetron (ZOFRAN) 8 MG tablet Take 8 mg by mouth daily as  needed for nausea (nausea).  03/30/14  Yes Historical Provider, MD  potassium chloride (MICRO-K) 10 MEQ CR capsule Take 3 capsules (30 mEq total) by mouth daily. 08/03/14  Yes Nicholas Lose, MD  Probiotic Product (PROBIOTIC DAILY PO) Take 1 tablet by mouth daily.    Yes Historical Provider, MD  diphenhydrAMINE (BENADRYL) 25 MG tablet Take 25 mg by mouth daily as needed for allergies (allergies).     Historical Provider, MD  loperamide (IMODIUM) 2 MG capsule Take 2 mg by mouth daily as needed for diarrhea or loose stools.    Historical Provider, MD  metroNIDAZOLE (FLAGYL) 500 MG tablet Take 1 tablet (500 mg total) by mouth 3 (three) times daily. One po bid x 15 days Patient not taking: Reported on 08/17/2014 08/01/14   Malvin Johns, MD   BP 117/61 mmHg  Pulse 86  Temp(Src) 97.5 F (36.4 C) (Oral)  Resp 19  Ht 5\' 3"  (1.6 m)  Wt 170 lb (77.111 kg)  BMI 30.12 kg/m2  SpO2 99% Physical Exam  Constitutional: She is oriented to person, place, and time. She appears well-developed and well-nourished. No distress.  HENT:  Head: Normocephalic and atraumatic.  Eyes: EOM are normal.  Neck: Normal range of motion.  Cardiovascular:  Tachycardia.  No murmur  Pulmonary/Chest: Effort normal and breath sounds normal.  Abdominal: Soft. She exhibits no distension. There is no tenderness.  Musculoskeletal: Normal range of motion.  Range of motion bilateral hips  Neurological: She is alert and oriented to person, place, and time.  Skin: Skin is warm and dry.  Psychiatric: She has a normal mood and affect. Judgment normal.  Nursing note and vitals reviewed.   ED Course  CARDIOVERSION Performed by: Jola Schmidt Authorized by: Jola Schmidt Consent: The procedure was performed in an emergent situation. Verbal consent obtained. Risks and benefits: risks, benefits and alternatives were discussed Consent given by: patient Required items: required blood products, implants, devices, and special equipment  available Patient identity confirmed: verbally with patient Time out: Immediately prior to procedure a "time out" was called to verify the correct patient, procedure, equipment, support staff and site/side marked as required. Patient sedated: yes Sedatives: etomidate Cardioversion basis: emergent Pre-procedure rhythm: wide complex tachycardia. Patient position: patient was placed in a supine position Chest area: chest area exposed Electrodes: pads Electrodes placed: anterior-posterior Number of attempts: 2 Attempt 1 mode: synchronous Attempt 1 waveform: biphasic Attempt 1 shock (in Joules): 120 Attempt 1 outcome: conversion to normal sinus rhythm (returned to wide  complex tachycardia after approx 5 beats of NSR) Attempt 2 mode: synchronous Attempt 2 waveform: biphasic Attempt 2 shock (in Joules): 150 Attempt 2 outcome: conversion to normal sinus rhythm Post-procedure rhythm: normal sinus rhythm Complications: no complications Patient tolerance: Patient tolerated the procedure well with no immediate complications    CRITICAL CARE Performed by: Hoy Morn Total critical care time: 75 Critical care time was exclusive of separately billable procedures and treating other patients. Critical care was necessary to treat or prevent imminent or life-threatening deterioration. Critical care was time spent personally by me on the following activities: development of treatment plan with patient and/or surrogate as well as nursing, discussions with consultants, evaluation of patient's response to treatment, examination of patient, obtaining history from patient or surrogate, ordering and performing treatments and interventions, ordering and review of laboratory studies, ordering and review of radiographic studies, pulse oximetry and re-evaluation of patient's condition.  Procedural sedation Performed by: Hoy Morn Consent: Verbal consent obtained. Risks and benefits: risks, benefits  and alternatives were discussed Required items: required blood products, implants, devices, and special equipment available Patient identity confirmed: arm band and provided demographic data Time out: Immediately prior to procedure a "time out" was called to verify the correct patient, procedure, equipment, support staff and site/side marked as required. Sedation type: moderate (conscious) sedation NPO time confirmed and considedered Sedatives: ETOMIDATE Physician Time at Bedside: 15 Vitals: Vital signs were monitored during sedation. Cardiac Monitor, pulse oximeter Patient tolerance: Patient tolerated the procedure well with no immediate complications. Comments: Pt with uneventful recovered. Returned to pre-procedural sedation baseline   Labs Review Labs Reviewed  CBC WITH DIFFERENTIAL/PLATELET - Abnormal; Notable for the following:    RDW 17.4 (*)    All other components within normal limits  COMPREHENSIVE METABOLIC PANEL - Abnormal; Notable for the following:    Glucose, Bld 174 (*)    Creatinine, Ser 1.33 (*)    Total Protein 5.8 (*)    Albumin 3.3 (*)    GFR calc non Af Amer 40 (*)    GFR calc Af Amer 46 (*)    All other components within normal limits  I-STAT CG4 LACTIC ACID, ED - Abnormal; Notable for the following:    Lactic Acid, Venous 4.27 (*)    All other components within normal limits  I-STAT TROPOININ, ED - Abnormal; Notable for the following:    Troponin i, poc 0.32 (*)    All other components within normal limits  CLOSTRIDIUM DIFFICILE BY PCR  URINE CULTURE  CULTURE, BLOOD (ROUTINE X 2)  CULTURE, BLOOD (ROUTINE X 2)  URINALYSIS, ROUTINE W REFLEX MICROSCOPIC  TSH  MAGNESIUM  PHOSPHORUS    Imaging Review No results found.  ECG interpretation #1  Date: 08/17/2014  Rate: 194  Rhythm: Wide-complex tachycardia PR Interval:  81 QRS Duration: 126 QT Interval:  287 QTC Calculation: 516 R Axis:   -81  Narrative Interpretation:   Old EKG Reviewed: changed  from prior ecg   ECG interpretation #2  Date: 08/17/2014  Rate: 189  Rhythm: Wide-complex tachycardia PR Interval:  85 QRS Duration: 121 QT Interval:  291 QTC Calculation: 564 R Axis:   -81  Narrative Interpretation:   Old EKG Reviewed: No significant changes noted   ECG interpretation #3  Date: 08/17/2014  Rate: 97  Rhythm: normal sinus rhythm  QRS Axis: normal  Intervals: normal  ST/T Wave abnormalities: nonspecific ST changes  Conduction Disutrbances: none  Narrative Interpretation:   Old EKG Reviewed: changed from earlier ecg  ECG interpretation #4 Date/Time:  Monday August 17 2014 05:23:22 EDT Ventricular Rate:  102 PR Interval:  64 QRS Duration: 149 QT Interval:  433 QTC Calculation: 564 R Axis:   -81 Text Interpretation:  Sinus or ectopic atrial tachycardia RBBB and LAFB  Abnormal T, consider ischemia, lateral leads RBBB new since earlier  tracing Confirmed by Kishan Wachsmuth  MD, Lennette Bihari ([id number]) on 08/17/2014 6:42:14 AM      MDM   Final diagnoses:  Diarrhea  Wide-complex tachycardia  Hypotension, unspecified hypotension type  Elevated troponin level  Elevated serum lactate dehydrogenase  Breast 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  Initially this was felt to be possibly SVT with aberrancy versus atrial flutter.  Given her hypotension is difficult to give her medications.  We initiated bolus IV fluids and got her heart rate up to 97 systolic.  However at this time she began to have morphology changes in her wide-complex tachycardia concerning for the possibility of DVT and therefore decision was made for cigarettes cardioversion as this could represent SVT with aberrancy or atrial flutter with block.  She required etomidate for sedation and underwent cardioversion twice.  Initially she converted to a sinus rhythm and then went back and no whites, looks tachycardia.  I cardioverted her  again with 150 J and she maintained normal sinus rhythm with this.  She was given 2.5 mg of metoprolol as her blood pressure came up to 833 systolic.  After cardioversion the patient is now in sinus rhythm and feels much better.  Her blood pressure is 3:83 systolic.  She does have elevated troponin and elevated lactate.  She has no active chest pain at this time.  Initial EKG after cardioversion demonstrated very mild ST depression anteriorly but she was without active chest pain at that time.  That she's been given additional IV fluids a repeat lactate will be obtained.  I suspect that the majority of her elevated lactate secondary to hypoperfusion.  Rectal temperature is 98.7.  We will hold antibiotics at this time.  She has had blood and urine cultures.  Mag, Foss, thyroid studies added in addition.   6:06 AM Spoke with Dr Jules Husbands of cardiology, who will have the morning cardiology team evaluate the patient in the hospital. Admit to stepdown at this time. hospitalist admission  Jola Schmidt, MD 08/17/14 [phone]

## 2014-08-17 NOTE — ED Notes (Signed)
Bed: YT11 Expected date:  Expected time:  Means of arrival:  Comments: 69 yo F Cancer patient Diarrhea, dizziness, weakness VSS A&O No IV -*--needs port accessed

## 2014-08-17 NOTE — Progress Notes (Signed)
CRITICAL VALUE ALERT  Critical value received:  Troponin 8.82  Date of notification:  Noted on labs  Time of notification:  1600   Nurse who received alert: Marnee Guarneri, RN  MD notified (1st page):  Notified Dr. Charlies Silvers.

## 2014-08-17 NOTE — ED Notes (Signed)
Per EMS pt from home, ca pt, was here couple days ago for dehydration, here for same thing, states has diarrhea/c diff, tonight got dizzy and fell, CBG 127, HR 66, BP 141.

## 2014-08-17 NOTE — ED Notes (Addendum)
Pt was cardioverted starting at 0514, first time at 120 joules, then at 0515 shocked at 150 joules, 11mg  Etomidate given at 0514 before started, at a point pt had 24mm nasal trumpet placed, now removed d/t pt gagging, pt vomited small amount of green material, 4mg  Zofran given. Pt now a/o x 3, in no distress.

## 2014-08-17 NOTE — Progress Notes (Signed)
CARE MANAGEMENT NOTE 08/17/2014  Patient:  AMAR, KEENUM   Account Number:  [id number]  Date Initiated:  08/17/2014  Documentation initiated by:  Mackenize Delgadillo  Subjective/Objective Assessment:   headache and hypotension/wide qrs complexes     Action/Plan:   home went stable   Anticipated DC Date:  08/20/2014   Anticipated DC Plan:  HOME/SELF CARE  In-house referral  NA      DC Planning Services  NA      [facility] Choice  NA   Choice offered to / List presented to:  NA   DME arranged  NA           Status of service:  In process, will continue to follow Medicare Important Message given?   (If response is "NO", the following Medicare IM given date fields will be blank) Date Medicare IM given:   Medicare IM given by:   Date Additional Medicare IM given:   Additional Medicare IM given by:    Discharge Disposition:    Per UR Regulation:  Reviewed for med. necessity/level of care/duration of stay  If discussed at [facility] of Stay Meetings, dates discussed:    Comments:  [id number]/Adell Panek Rosana Hoes RN, BSN, CCN: [phone] Case management. Chart reviewed for discharge planning and present needs. Discharge needs: none present at time of review

## 2014-08-17 NOTE — Consult Note (Addendum)
Patient ID: Cheyenne Riggs MRN: [id number] DOB/AGE: 69-May-1947 69 y.o.  Admit date: 08/17/2014 Referring Physician: Venora Maples Primary Cardiologist: Nahser Reason for Consultation: Wide complex tachycardia  HPI: 69 yo female with history of HTN, HLD, breast cancer on chemotherapy admitted with diarrhea, weakness, hypotension and found to be in wide complex tachycardia. She was hypotensive and cardioverted in the ED to sinus. Presenting BP was 60 systolic. Post cardioversion EKG shows sinus with RBBB. Her baseline EKG did not show RBBB. She is now normotensive and in sinus with no complaints. Denies SOB, chest pain, LE edema, orthopnea, PND. Chest x-ray with evidence of volume overload. Of note, she was seen in the hospital by our team in February 2016, Dr. Acie Fredrickson. At that time she had volume overload felt to be consisent with diastolic CHF. Echo 06/30/14 with normal LV function, mild MR, mild AI. Her chemotherapy regimen has included Adriamycin per records. She has managed volume overload with prn Lasix at home.    Past Medical History  Diagnosis Date  . Anxiety   . Back pain   . H/O bladder problems   . Cholelithiasis   . Depression   . High blood pressure   . Hypercholesteremia   . Kidney stones   . Gum disease   . Complication of anesthesia     bp goes up  . Wears glasses   . Wears dentures     top    Family History  Problem Relation Age of Onset  . Stroke Mother     onset in her 14's  . Cancer - Other Father     eye cancer, black lung diseae  . Heart failure Sister   . Hypertension Brother     History   Social History  . Marital Status: Divorced    Spouse Name: N/A  . Number of Children: N/A  . Years of Education: N/A   Occupational History  . Not on file.   Social History Main Topics  . Smoking status: Former Smoker -- 0.00 packs/day    Quit date: 05/16/2014  . Smokeless tobacco: Not on file  . Alcohol Use: No  . Drug Use: No  . Sexual Activity: Not on file     Other Topics Concern  . Not on file   Social History Narrative    Past Surgical History  Procedure Laterality Date  . Colon biopsy    . Mouth surgery    . Spine surgery    . Colonoscopy    . Tubal ligation    . Lithotripsy    . Rhinoplasty    . Back surgery  1989    lumb lam  . Breast biopsy Right 02/05/2014    invasive ductal cncer  . Abdominal hysterectomy  1982  . Cholecystectomy  2011    lap choli  . Radioactive seed guided mastectomy with axillary sentinel lymph node biopsy Right 02/27/2014    Procedure: RADIOACTIVE SEED GUIDED RIGHT PARTIAL MASTECTOMY WITH AXILLARY SENTINEL LYMPH NODE BIOPSY;  Surgeon: Stark Klein, MD;  Location: [facility];  Service: General;  Laterality: Right;  . Portacath placement N/A 02/27/2014    Procedure: INSERTION PORT-A-CATH;  Surgeon: Stark Klein, MD;  Location: [place] [facility];  Service: General;  Laterality: N/A;    Allergies  Allergen Reactions  . Ciprofloxacin Nausea And Vomiting  . Demerol [Meperidine] Hives  . Lidocaine     palpatations  . Other Swelling    Eye ointments  . Septra [  Sulfamethoxazole-Trimethoprim] Nausea And Vomiting  . Codeine Rash  . Novocain [Procaine] Palpitations    Prior to Admission medications   Medication Sig Start Date End Date Taking? Authorizing Provider  acetaminophen (TYLENOL) 325 MG tablet Take 650 mg by mouth every 6 (six) hours as needed for moderate pain (pain).    Yes Historical Provider, MD  cholecalciferol (VITAMIN D) 1000 UNITS tablet Take 1,000 Units by mouth daily.    Yes Historical Provider, MD  FLUoxetine (PROZAC) 10 MG capsule Take 1 capsule (10 mg total) by mouth daily. 05/26/14  Yes Hosie Poisson, MD  furosemide (LASIX) 40 MG tablet Take 1 tablet (40 mg total) by mouth daily as needed for fluid (Take a lasix 40 mg and Kdur 20 meq if your weight goes up 5 lbs). 07/01/14  Yes Donne Hazel, MD  lidocaine-prilocaine (EMLA) cream Apply 1 application topically once  a week. On Chemo Day (Monday). 03/27/14  Yes Historical Provider, MD  LORazepam (ATIVAN) 0.5 MG tablet Take 1 tablet (0.5 mg total) by mouth every 8 (eight) hours as needed. Nausea 08/11/14  Yes Nicholas Lose, MD  ondansetron (ZOFRAN) 8 MG tablet Take 8 mg by mouth daily as needed for nausea (nausea).  03/30/14  Yes Historical Provider, MD  potassium chloride (MICRO-K) 10 MEQ CR capsule Take 3 capsules (30 mEq total) by mouth daily. 08/03/14  Yes Nicholas Lose, MD  Probiotic Product (PROBIOTIC DAILY PO) Take 1 tablet by mouth daily.    Yes Historical Provider, MD  diphenhydrAMINE (BENADRYL) 25 MG tablet Take 25 mg by mouth daily as needed for allergies (allergies).     Historical Provider, MD  loperamide (IMODIUM) 2 MG capsule Take 2 mg by mouth daily as needed for diarrhea or loose stools.    Historical Provider, MD  metroNIDAZOLE (FLAGYL) 500 MG tablet Take 1 tablet (500 mg total) by mouth 3 (three) times daily. One po bid x 15 days Patient not taking: Reported on 08/17/2014 08/01/14   Malvin Johns, MD    Review of systems complete and found to be negative unless listed above    Physical Exam: Blood pressure 118/90, pulse 89, temperature 97.5 F (36.4 C), temperature source Oral, resp. rate 21, height 5\' 3"  (1.6 m), weight 170 lb (77.111 kg), SpO2 97 %.    General: Well developed, well nourished, NAD  HEENT: OP clear, mucus membranes moist  SKIN: warm, dry. No rashes.  Neuro: No focal deficits  Musculoskeletal: Muscle strength 5/5 all ext  Psychiatric: Mood and affect normal  Neck: No JVD, no carotid bruits, no thyromegaly, no lymphadenopathy.  Lungs:Clear bilaterally, no wheezes, rhonci, crackles  Cardiovascular: Regular rate and rhythm. No murmurs, gallops or rubs.  Abdomen:Soft. Bowel sounds present. Non-tender.  Extremities: No lower extremity edema. Pulses are 2 + in the bilateral DP/PT.  Labs:   Lab Results  Component Value Date   WBC 4.3 08/17/2014   HGB 12.3 08/17/2014   HCT  38.7 08/17/2014   MCV 88.2 08/17/2014   PLT 176 08/17/2014     Recent Labs Lab 08/17/14 0439  NA 141  K 3.5  CL 106  CO2 21  BUN 6  CREATININE 1.33*  CALCIUM 8.9  PROT 5.8*  BILITOT 0.8  ALKPHOS 57  ALT 8  AST 23  GLUCOSE 174*    Chest x-ray 08/17/14: FINDINGS: The cardiac silhouette is moderately enlarged, increased from prior examination. Pulmonary vascular congestion mild interstitial prominence with small LEFT pleural effusion. Patchy retrocardiac airspace opacity suggested. No pneumothorax. Single  lumen RIGHT chest Port-A-Cath with distal tip projecting in proximal superior vena cava. Surgical clips in RIGHT chest.  IMPRESSION: Worsening moderate cardiomegaly with pulmonary vascular congestion interstitial prominence most consistent with pulmonary edema, confluent in the LEFT lung base with small LEFT pleural effusion.  EKG: Sinus, RBBB, Non-specific T wave abnormalities, QTc 564  Echo 06/30/14: Left ventricle: The cavity size was normal. Wall thickness was normal. Systolic function was normal. The estimated ejection fraction was in the range of 55% to 60%. Wall motion was normal; there were no regional wall motion abnormalities. - Aortic valve: There was mild regurgitation. - Mitral valve: There was mild regurgitation.   ASSESSMENT AND PLAN:   1. Wide complex tachycardia: This is most likely SVT with aberrancy as she has a RBBB noted on EKG post cardioversion. She is known to have normal LV function so VT is less likely but she does have prolonged QTc at baseline. For now, would start low dose beta blocker. I will review the serial EKGs with EP today. Will repeat echo to assess LVEF given ongoing chemotherapy. Follow on telemetry.   2. Acute on chronic diastolic CHF: Would diurese with IV Lasix and adjust dosing based on clinical response.   3. Prolonged QTc: at baseline. Avoid QT prolonging drugs (www.crediblemeds.org). Replace potassium and  magnesium.  4. Breast cancer: Per primary team, oncology.      Signed: Lauree Chandler, MD 08/17/2014, 8:35 AM

## 2014-08-17 NOTE — ED Notes (Signed)
Pt states was recently diagnosed w/ cdiff and was treated for dehydration, states thought she was getting over it then today started having n/v/d, states became weak and dizzy and fell in her living room this evening, denies any injuries.

## 2014-08-18 ENCOUNTER — Telehealth: Payer: Self-pay

## 2014-08-18 DIAGNOSIS — N183 Chronic kidney disease, stage 3 unspecified: Secondary | ICD-10-CM | POA: Diagnosis present

## 2014-08-18 DIAGNOSIS — I10 Essential (primary) hypertension: Secondary | ICD-10-CM

## 2014-08-18 DIAGNOSIS — I5031 Acute diastolic (congestive) heart failure: Secondary | ICD-10-CM

## 2014-08-18 DIAGNOSIS — T451X5A Adverse effect of antineoplastic and immunosuppressive drugs, initial encounter: Secondary | ICD-10-CM

## 2014-08-18 DIAGNOSIS — R7989 Other specified abnormal findings of blood chemistry: Secondary | ICD-10-CM

## 2014-08-18 DIAGNOSIS — R000 Tachycardia, unspecified: Secondary | ICD-10-CM

## 2014-08-18 DIAGNOSIS — R197 Diarrhea, unspecified: Secondary | ICD-10-CM

## 2014-08-18 DIAGNOSIS — D6481 Anemia due to antineoplastic chemotherapy: Secondary | ICD-10-CM

## 2014-08-18 DIAGNOSIS — I5021 Acute systolic (congestive) heart failure: Secondary | ICD-10-CM

## 2014-08-18 LAB — CBC
HCT: 35.2 % — ABNORMAL LOW (ref 36.0–46.0)
Hemoglobin: 10.7 g/dL — ABNORMAL LOW (ref 12.0–15.0)
MCH: 27.4 pg (ref 26.0–34.0)
MCHC: 30.4 g/dL (ref 30.0–36.0)
MCV: 90.3 fL (ref 78.0–100.0)
Platelets: 152 10*3/uL (ref 150–400)
RBC: 3.9 MIL/uL (ref 3.87–5.11)
RDW: 17.7 % — AB (ref 11.5–15.5)
WBC: 4.7 10*3/uL (ref 4.0–10.5)

## 2014-08-18 LAB — COMPREHENSIVE METABOLIC PANEL
ALK PHOS: 51 U/L (ref 39–117)
ALT: 8 U/L (ref 0–35)
AST: 39 U/L — AB (ref 0–37)
Albumin: 3 g/dL — ABNORMAL LOW (ref 3.5–5.2)
Anion gap: 8 (ref 5–15)
BUN: 7 mg/dL (ref 6–23)
CO2: 24 mmol/L (ref 19–32)
CREATININE: 1.39 mg/dL — AB (ref 0.50–1.10)
Calcium: 8.3 mg/dL — ABNORMAL LOW (ref 8.4–10.5)
Chloride: 112 mmol/L (ref 96–112)
GFR calc Af Amer: 44 mL/min — ABNORMAL LOW (ref >90)
GFR, EST NON AFRICAN AMERICAN: 38 mL/min — AB (ref >90)
Glucose, Bld: 139 mg/dL — ABNORMAL HIGH (ref 70–99)
Potassium: 3.6 mmol/L (ref 3.5–5.1)
Sodium: 144 mmol/L (ref 135–145)
Total Bilirubin: 0.4 mg/dL (ref 0.3–1.2)
Total Protein: 5.5 g/dL — ABNORMAL LOW (ref 6.0–8.3)

## 2014-08-18 LAB — GLUCOSE, CAPILLARY: Glucose-Capillary: 127 mg/dL — ABNORMAL HIGH (ref 70–99)

## 2014-08-18 LAB — TROPONIN I: TROPONIN I: 5.95 ng/mL — AB (ref <0.031)

## 2014-08-18 MED ORDER — CEFTRIAXONE SODIUM 1 G IJ SOLR
1.0000 g | INTRAMUSCULAR | Status: DC
  Administered 2014-08-18 – 2014-08-19 (×2): 1 g via INTRAVENOUS
  Filled 2014-08-18 (×2): qty 10

## 2014-08-18 MED ORDER — LORAZEPAM 0.5 MG PO TABS
0.5000 mg | ORAL_TABLET | Freq: Once | ORAL | Status: AC
  Administered 2014-08-18: 0.5 mg via ORAL
  Filled 2014-08-18: qty 1

## 2014-08-18 MED ORDER — BOOST / RESOURCE BREEZE PO LIQD
1.0000 | Freq: Three times a day (TID) | ORAL | Status: DC
  Administered 2014-08-19: 1 via ORAL

## 2014-08-18 MED ORDER — LORAZEPAM 2 MG/ML IJ SOLN: 1.0000 mg | Freq: Once | INTRAMUSCULAR | Status: DC

## 2014-08-18 NOTE — Progress Notes (Addendum)
Patient ID: Cheyenne Riggs, female   DOB: 1945/08/26, 69 y.o.   MRN: [id number] [facility] PROGRESS NOTE  Cheyenne Riggs KGS:[id number] DOB: 1945/07/09 DOA: 09/01/14 PCP: Gennette Pac, MD  Brief narrative:    38 -year-old female with past medical history of breast cancer on chemotherapy (follows with Dr. Lindi Adie), hypertension, dyslipidemia, diastolic CHF (last 2 D ECHO in 06/2014 with normal LV function), recent diagnosis of C. Difficile (has not fully completed Flagyl, had 2 days left to complete). Patient presented to [facility] long ED with complaints of worsening weakness, ongoing diarrhea/loose stools. No reports of fevers at home. No reports of nausea or vomiting.  On admission, patient was hypotensive with blood pressure 72/57 which has subsequently improved 235/71. Heart rate was as high as 194, respiratory rate 28. She was afebrile and with oxygen saturation 94% on room air. Blood work was unremarkable except for mild elevation in creatinine, 1.33. The 12-lead EKG in ED showed a wide-complex tachycardia. She was cardioverted in emergency room.Additionally, her stool for C. difficile positive for C. difficile and she is on IV Flagyl.   Assessment/Plan:    Principal Problem: Enteritis due to Clostridium difficile - Patient with recent diagnosis of C. difficile, she was on Flagyl prior to the admission. Her C. difficile by PCR is still positive on this admission. We will continue Flagyl. - Continue enteric precautions. - Continue acidophilus supplementation. - Stable to transfer to telemetry floor   Active Problems: Weakness / wide-complex tachycardia - Patient cardioverted in ED. patient remains hemodynamically stable. - Per cardiology, low-dose beta blocker started. - 2-D echo is pending.  UTI - Urinalysis on admission showed few bacteria. Patient received 1 dose of Rocephin on the admission. Will continue Rocephin since urine culture is growing gram-negative rods. -  Follow up urine culture results.  Breast cancer of upper-outer quadrant of right female breast - Follows with Dr. Lindi Adie outpatient - Dr. Lindi Adie informed of patient's admission.  Acute on chronic diastolic CHF - Last 2 D ECHO in 06/2014 with preserved LV function - 2-D echo on this admission is pending. - BNP on this admission 1981. - Started Lasix 40 mg IV every 12 hours per cardiology recommendations. - Continue daily weight, strict intake and output and replete electrolytes as needed. - Daily weight and replete electrolytes as needed, strict intake and output  Essential hypertension - Blood pressure 164/76 this morning. - Continue Lasix and metoprolol  Troponin elevation - Likely demand ischemia form wide complex tachycardia - Troponin level 8.82 the highest and has trended down to 5.95. - Follow-up with cardiology and their recommendations.  Normocytic anemia / anemia of chronic disease - Anemia secondary to history of malignancy - Hemoglobin stable, 10.7. - No current indications for transfusion.  Chronic kidney disease, stage III - Patient had a creatinine as high as 1.6 back in December 2015. - Creatinine on this admission 1.3 and remains relatively stable while patient is on IV Lasix.  Severe protein calorie malnutrition - Nutrition consulted - Order placed for regular diet.  DVT prophylaxis - Lovenox subQ   Code Status: Full.  Family Communication:  plan of care discussed with the patient Disposition Plan: home once results of urine culture are back.  IV access:  Peripheral IV  Procedures and diagnostic studies:    Dg Chest Port 1 View 2014-09-01   Worsening moderate cardiomegaly with pulmonary vascular congestion interstitial prominence most consistent with pulmonary edema, confluent in the LEFT lung base with small LEFT pleural effusion.  Medical Consultants:  Cardiology  Other Consultants:  None   IAnti-Infectives:   Flagyl 08/17/2014 --> Rocephin  08/17/2014 -->   Leisa Lenz, MD  [facility] Pager [phone]  If 7PM-7AM, please contact night-coverage [email or web address] Password [facility] 08/18/2014, 8:47 AM   LOS: 1 day    HPI/Subjective: No acute overnight events.  Objective: Filed Vitals:   08/18/14 0128 08/18/14 0200 08/18/14 0414 08/18/14 0500  BP: 136/80  164/76   Pulse:  75 76   Temp:   98.1 F (36.7 C)   TempSrc:   Oral   Resp:  16 21   Height:      Weight:    79.5 kg (175 lb 4.3 oz)  SpO2:  98% 98%     Intake/Output Summary (Last 24 hours) at 08/18/14 0847 Last data filed at 08/18/14 0600  Gross per 24 hour  Intake    683 ml  Output   1776 ml  Net  -1093 ml    Exam:   General:  Pt is alert, follows commands appropriately, not in acute distress  Cardiovascular: Regular rate and rhythm, S1/S2, no murmurs  Respiratory: Clear to auscultation bilaterally, no wheezing, no crackles, no rhonchi  Abdomen: Soft, non tender, non distended, bowel sounds present  Extremities: No edema, pulses DP and PT palpable bilaterally  Neuro: Grossly nonfocal  Data Reviewed: Basic Metabolic Panel:  Recent Labs Lab 08/13/14 1012 08/17/14 0439 08/17/14 0444 08/18/14 0410  NA 142 141  --  144  K 3.2* 3.5  --  3.6  CL  --  106  --  112  CO2 24 21  --  24  GLUCOSE 127 174*  --  139*  BUN 4.7* 6  --  7  CREATININE 1.2* 1.33*  --  1.39*  CALCIUM 8.9 8.9  --  8.3*  MG  --   --  1.5  --   PHOS  --   --  3.3  --    Liver Function Tests:  Recent Labs Lab 08/13/14 1012 08/17/14 0439 08/18/14 0410  AST 18 23 39*  ALT 6 8 8   ALKPHOS 70 57 51  BILITOT 0.72 0.8 0.4  PROT 5.8* 5.8* 5.5*  ALBUMIN 3.1* 3.3* 3.0*   No results for input(s): LIPASE, AMYLASE in the last 168 hours. No results for input(s): AMMONIA in the last 168 hours. CBC:  Recent Labs Lab 08/13/14 1012 08/17/14 0439 08/18/14 0410  WBC 4.9 4.3 4.7  NEUTROABS 3.6 2.8  --   HGB 11.7 12.3 10.7*  HCT 37.6 38.7 35.2*  MCV 86.7 88.2 90.3  PLT  222 176 152   Cardiac Enzymes:  Recent Labs Lab 08/17/14 0955 08/17/14 1452 08/17/14 2115 08/18/14 0410  TROPONINI 6.39* 8.82* 8.02* 5.95*   BNP: Invalid input(s): POCBNP CBG:  Recent Labs Lab 08/18/14 0836  GLUCAP 127*    Urine culture     Status: None (Preliminary result)   Collection Time: 08/17/14  6:00 AM  Result Value Ref Range Status   Specimen Description URINE, CATHETERIZED  Final   Special Requests NONE  Final   Colony Count   Final   Culture   Final    GRAM NEGATIVE RODS   Report Status PENDING  Incomplete  Blood Culture (routine x 2)     Status: None (Preliminary result)   Collection Time: 08/17/14  6:04 AM  Result Value Ref Range Status   Specimen Description BLOOD RIGHT HAND  Final   Special Requests BOTTLES DRAWN AEROBIC ONLY  4CC  Final   Culture   Final           BLOOD CULTURE RECEIVED NO GROWTH TO DATE    Report Status PENDING  Incomplete  Blood Culture (routine x 2)     Status: None (Preliminary result)   Collection Time: 08/17/14  6:04 AM  Result Value Ref Range Status   Specimen Description BLOOD RIGHT PORT  Final   Special Requests BOTTLES DRAWN AEROBIC AND ANAEROBIC 5CC  Final   Culture   Final           BLOOD CULTURE RECEIVED NO GROWTH TO DATE    Report Status PENDING  Incomplete  MRSA PCR Screening     Status: None   Collection Time: 08/17/14  8:45 AM  Result Value Ref Range Status   MRSA by PCR NEGATIVE NEGATIVE Final  Clostridium Difficile by PCR     Status: Abnormal   Collection Time: 08/17/14  8:50 AM  Result Value Ref Range Status   C difficile by pcr POSITIVE (A) NEGATIVE Final     Scheduled Meds: . acidophilus  1 capsule Oral Daily  . antiseptic oral rinse  7 mL Mouth Rinse BID  . cholecalciferol  1,000 Units Oral Daily  . enoxaparin (LOVENOX) injection  40 mg Subcutaneous Q24H  . FLUoxetine  10 mg Oral Daily  . furosemide  40 mg Intravenous Q12H  . metoprolol tartrate  25 mg Oral BID  . metronidazole  500 mg Intravenous  Q8H   Continuous Infusions: . sodium chloride 10 mL/hr at 08/17/14 [phone]

## 2014-08-18 NOTE — Progress Notes (Signed)
LV function on echo is normal. Therefore the elevated Troponin in setting of tachycardia and hypotension is likely demand and not ACS. Could have ischemic w/u as OP when feels better. Continue beta blocker to prevent arrhythmia recurrence.

## 2014-08-18 NOTE — Progress Notes (Signed)
Patient transferred from ICU to 1444, alert and oriented, denies any pain/distress. Oriented patient to room/unit and reviewed plan of care with patient. No wound noted, skin intact. Will continue to assess patient.

## 2014-08-18 NOTE — Progress Notes (Signed)
Echocardiogram 2D Echocardiogram has been performed.  Cheyenne Riggs 08/18/2014, 10:59 AM

## 2014-08-18 NOTE — Telephone Encounter (Signed)
Returned call to [place] at [employer] with verbal order for social work to see the patient.

## 2014-08-18 NOTE — Progress Notes (Signed)
       Patient Name: Cheyenne Riggs Date of Encounter: 08/18/2014    SUBJECTIVE: Feels weak. No chest pain. Diarrhea improved. Wants to go home.  TELEMETRY:  NSR with last ECG showing RBBB with QT prolongation Filed Vitals:   08/18/14 0128 08/18/14 0200 08/18/14 0414 08/18/14 0500  BP: 136/80  164/76   Pulse:  75 76   Temp:   98.1 F (36.7 C)   TempSrc:   Oral   Resp:  16 21   Height:      Weight:    175 lb 4.3 oz (79.5 kg)  SpO2:  98% 98%     Intake/Output Summary (Last 24 hours) at 08/18/14 0821 Last data filed at 08/18/14 0600  Gross per 24 hour  Intake    693 ml  Output   1776 ml  Net  -1083 ml   LABS: Basic Metabolic Panel:  Recent Labs  08/17/14 0439 08/17/14 0444 08/18/14 0410  NA 141  --  144  K 3.5  --  3.6  CL 106  --  112  CO2 21  --  24  GLUCOSE 174*  --  139*  BUN 6  --  7  CREATININE 1.33*  --  1.39*  CALCIUM 8.9  --  8.3*  MG  --  1.5  --   PHOS  --  3.3  --    CBC:  Recent Labs  08/17/14 0439 08/18/14 0410  WBC 4.3 4.7  NEUTROABS 2.8  --   HGB 12.3 10.7*  HCT 38.7 35.2*  MCV 88.2 90.3  PLT 176 152   Cardiac Enzymes:  Recent Labs  08/17/14 1452 08/17/14 2115 08/18/14 0410  TROPONINI 8.82* 8.02* 5.95*   BNP (last 3 results)  Recent Labs  06/29/14 1740 08/17/14 0955  BNP 534.8* 1980.9*   ECG: NSR wit NSTWA. QT interval improved but still mildly prolonged. RBBB has resolved.  Radiology/Studies:  CE with CHF  Physical Exam: Blood pressure 164/76, pulse 76, temperature 98.1 F (36.7 C), temperature source Oral, resp. rate 21, height 5\' 3"  (1.6 m), weight 175 lb 4.3 oz (79.5 kg), SpO2 98 %. Weight change: 5 lb 4.3 oz (2.388 kg)  Wt Readings from Last 3 Encounters:  08/18/14 175 lb 4.3 oz (79.5 kg)  08/13/14 171 lb 14.4 oz (77.973 kg)  08/09/14 175 lb 1.6 oz (79.425 kg)   No rub or gallop on exam. Chest is clear   ASSESSMENT:  1. Elevated troponin consistent with NSTEMI. R/O Stress cardiomyopathy vs demand related  due to tachycardia and profound hypotension vs Cardiotoxicity from adriamycin 2. Acute heart failure, suspect diastolic but r/o systolic if cardiotoxicity from chemo(Adria and Taxol) or stress CM. Elevated BNP. 3. C. diff enterocolitis 4. 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 5. Long QT, electrolyte related, improved with repletion of K+ 6. Breast cancer   Plan:  1. Echocardiogram done but not read. If decreased LV systolic function and or regional WMA, will require further in hospital ischemic w/u when strong enough 2. Beta blocker therapy is essential. 3. Ambulate with assistance to insure BP and HR/rhythm support activity  Signed, Sinclair Grooms 08/18/2014, 8:21 AM

## 2014-08-19 DIAGNOSIS — C50411 Malignant neoplasm of upper-outer quadrant of right female breast: Secondary | ICD-10-CM

## 2014-08-19 DIAGNOSIS — A047 Enterocolitis due to Clostridium difficile: Secondary | ICD-10-CM

## 2014-08-19 DIAGNOSIS — I5021 Acute systolic (congestive) heart failure: Secondary | ICD-10-CM

## 2014-08-19 LAB — TROPONIN I: TROPONIN I: 2.85 ng/mL — AB (ref <0.031)

## 2014-08-19 LAB — URINE CULTURE

## 2014-08-19 LAB — GLUCOSE, CAPILLARY: GLUCOSE-CAPILLARY: 107 mg/dL — AB (ref 70–99)

## 2014-08-19 MED ORDER — HEPARIN SOD (PORK) LOCK FLUSH 100 UNIT/ML IV SOLN: 500.0000 [IU] | INTRAVENOUS | Status: DC | PRN

## 2014-08-19 MED ORDER — SODIUM CHLORIDE 0.9 % IJ SOLN
10.0000 mL | INTRAMUSCULAR | Status: DC | PRN
  Administered 2014-08-19: 20 mL
  Administered 2014-08-19: 10 mL

## 2014-08-19 MED ORDER — METOPROLOL TARTRATE 25 MG PO TABS: 25.0000 mg | ORAL_TABLET | Freq: Two times a day (BID) | ORAL | Status: DC

## 2014-08-19 MED ORDER — HEPARIN SOD (PORK) LOCK FLUSH 100 UNIT/ML IV SOLN
500.0000 [IU] | INTRAVENOUS | Status: AC | PRN
  Administered 2014-08-19: 500 [IU]

## 2014-08-19 NOTE — Discharge Summary (Signed)
Physician Discharge Summary  STELLA BORTLE LOV:[id number] DOB: 05-10-1946 DOA: 08/17/2014  PCP: Gennette Pac, MD  Admit date: 08/17/2014 Discharge date: 08/19/2014  Time spent: 45 minutes  Recommendations for Outpatient Follow-up:  1. Refer to Dr. Cathie Olden for outpatient Myoview when strong enough to tolerate 2. Once Flagyl complete, follow for recurrent diarrhea 3. Note patient has a new finding of acute systolic heart failure-need to be careful with IV fluid boluses that she is receiving at the cancer center 4. B met in 4 days- - consider adding ACE inhibitor as outpatient if BP is stable and creatinine stable  Discharge Condition: Stable Diet recommendation:  As Tolerated  Discharge Diagnoses:  Principal Problem:   Wide-complex tachycardia Active Problems:   Elevated troponin   Acute systolic heart failure   Breast cancer of upper-outer quadrant of right female breast   Essential hypertension, benign   Enteritis due to Clostridium difficile   Antineoplastic chemotherapy induced anemia   UTI (lower urinary tract infection)   Protein-calorie malnutrition, severe   CKD (chronic kidney disease), stage III   History of present illness:  69 -year-old female with past medical history of breast cancer on chemotherapy (follows with Dr. Lindi Adie), hypertension, dyslipidemia, diastolic CHF (last 2 D ECHO in 06/2014 with normal LV function), recent diagnosis of C. Difficile (has not fully completed Flagyl, had 2 days left to complete). She was recently in the hospital admitted on 3/27 when she presented with vomiting and was found to have mildly elevated troponins. She was monitored overnight-troponins remained flat and cardiology did not recommend any further workup. Her vomiting resolved and she received IV fluids and was discharged home on 3/28  Patient presented to [facility] long ED again on 4/4 with recurrent loose stool and complaints of worsening weakness- he had just been seen on 4/1 at the  cancer center and was reported to have had one episode of loose stool in the past 24 hours. Is important to note that the patient is not on solid food and has been mostly taking liquids at home.  On admission, patient was hypotensive with blood pressure 72/57. 12-lead EKG in ED showed a wide-complex tachycardia. She was cardioverted in emergency room.After cardioversion EKG showed a right bundle branch block which was new. He was also suspected to have acute on chronic diastolic heart failure. Of note she had received IV fluids at the cancer center on 4/1. Troponins were noted to be elevated after admission.   Hospital Course:  Wide-complex tachycardia -As mentioned above, cardioverted in the ER. Echocardiogram done- EF 35% with diffuse hypokinesis and mild to moderate mitral regurgitation. Mention of diastolic dysfunction. Cardiology feels the wide-complex tachycardia was PSVT with aberration. There recommend to continue beta blocker-she is on metoprolol 25 twice a day which I have continued.  Elevated troponin/ NSTEMI -Per cardiology note this could be related to tachycardia and hypotension that occurred in the ER - It could also be secondary to the 3 shocks that she received for cardioversion in the ER -They also recommended an outpatient pharmacological nuclear stress test when she is able to tolerated  Acute on chronic systolic CHF - Last 2 D ECHO in 06/2014 with preserved LV function - 2-D echo on this admission is mentioned above with an EF of 30-35% - she is already on when necessary Lasix at home which I will continue this way as her by mouth intake is quite poor  - Weight today is 76.3 kg --hold off on ACE inhibitor at this  time as her creatinine is running higher than her baseline which is usually less than 1  C. difficile colitis -No further diarrhea in the hospital-she is chronically nauseated which is unchanged-I recommended that she finish up her course which includes one more  day  UTI? -UN admission only showed a few bacteria but culture has grown out Klebsiella pneumonia-today's day 3 of Rocephin to which it is sensitive- no need to further continue Rocephin after today  Breast cancer of upper-outer quadrant of right female breast - Follows with Dr. Lindi Adie outpatient  Procedures:  2-D echo  Consultations:  Cardiology  Discharge Exam: Filed Weights   08/17/14 0538 08/18/14 0500 08/19/14 0515  Weight: 77.111 kg (170 lb) 79.5 kg (175 lb 4.3 oz) 76.34 kg (168 lb 4.8 oz)   Filed Vitals:   08/19/14 0515  BP: 147/77  Pulse: 70  Temp: 97.6 F (36.4 C)  Resp: 18    General: AAO x 3, no distress Cardiovascular: RRR, no murmurs  Respiratory: clear to auscultation bilaterally GI: soft, non-tender, non-distended, bowel sound positive  Discharge Instructions You were cared for by a hospitalist during your hospital stay. If you have any questions about your discharge medications or the care you received while you were in the hospital after you are discharged, you can call the unit and asked to speak with the hospitalist on call if the hospitalist that took care of you is not available. Once you are discharged, your primary care physician will handle any further medical issues. Please note that NO REFILLS for any discharge medications will be authorized once you are discharged, as it is imperative that you return to your primary care physician (or establish a relationship with a primary care physician if you do not have one) for your aftercare needs so that they can reassess your need for medications and monitor your lab values.      Discharge Instructions    Discharge instructions    Complete by:  As directed   Regular diet     Increase activity slowly    Complete by:  As directed             Medication List    TAKE these medications        acetaminophen 325 MG tablet  Commonly known as:  TYLENOL  Take 650 mg by mouth every 6 (six) hours as  needed for moderate pain (pain).     cholecalciferol 1000 UNITS tablet  Commonly known as:  VITAMIN D  Take 1,000 Units by mouth daily.     diphenhydrAMINE 25 MG tablet  Commonly known as:  BENADRYL  Take 25 mg by mouth daily as needed for allergies (allergies).     FLUoxetine 10 MG capsule  Commonly known as:  PROZAC  Take 1 capsule (10 mg total) by mouth daily.     furosemide 40 MG tablet  Commonly known as:  LASIX  Take 1 tablet (40 mg total) by mouth daily as needed for fluid (Take a lasix 40 mg and Kdur 20 meq if your weight goes up 5 lbs).     lidocaine-prilocaine cream  Commonly known as:  EMLA  Apply 1 application topically once a week. On Chemo Day (Monday).     loperamide 2 MG capsule  Commonly known as:  IMODIUM  Take 2 mg by mouth daily as needed for diarrhea or loose stools.     LORazepam 0.5 MG tablet  Commonly known as:  ATIVAN  Take 1 tablet (  0.5 mg total) by mouth every 8 (eight) hours as needed. Nausea     metoprolol tartrate 25 MG tablet  Commonly known as:  LOPRESSOR  Take 1 tablet (25 mg total) by mouth 2 (two) times daily.     metroNIDAZOLE 500 MG tablet  Commonly known as:  FLAGYL  Take 1 tablet (500 mg total) by mouth 3 (three) times daily. One po bid x 15 days     ondansetron 8 MG tablet  Commonly known as:  ZOFRAN  Take 8 mg by mouth daily as needed for nausea (nausea).     potassium chloride 10 MEQ CR capsule  Commonly known as:  MICRO-K  Take 3 capsules (30 mEq total) by mouth daily.     PROBIOTIC DAILY PO  Take 1 tablet by mouth daily.       Allergies  Allergen Reactions  . Ciprofloxacin Nausea And Vomiting  . Demerol [Meperidine] Hives  . Lidocaine     palpatations  . Other Swelling    Eye ointments  . Septra [Sulfamethoxazole-Trimethoprim] Nausea And Vomiting  . Codeine Rash  . Novocain [Procaine] Palpitations      The results of significant diagnostics from this hospitalization (including imaging, microbiology,  ancillary and laboratory) are listed below for reference.    Significant Diagnostic Studies: Dg Chest Port 1 View  08/17/2014   CLINICAL DATA:  Nausea, vomiting, weakness and diarrhea. History of breast cancer, on chemotherapy.  EXAM: PORTABLE CHEST - 1 VIEW  COMPARISON:  Chest radiograph June 30, 2014  FINDINGS: The cardiac silhouette is moderately enlarged, increased from prior examination. Pulmonary vascular congestion mild interstitial prominence with small LEFT pleural effusion. Patchy retrocardiac airspace opacity suggested. No pneumothorax. Single lumen RIGHT chest Port-A-Cath with distal tip projecting in proximal superior vena cava. Surgical clips in RIGHT chest.  IMPRESSION: Worsening moderate cardiomegaly with pulmonary vascular congestion interstitial prominence most consistent with pulmonary edema, confluent in the LEFT lung base with small LEFT pleural effusion.   Electronically Signed   By: Elon Alas   On: 08/17/2014 06:10    Microbiology: Recent Results (from the past 240 hour(s))  Urine culture     Status: None   Collection Time: 08/17/14  6:00 AM  Result Value Ref Range Status   Specimen Description URINE, CATHETERIZED  Final   Special Requests NONE  Final   Colony Count   Final    80,000 COLONIES/ML Performed at [facility]    Culture   Final    KLEBSIELLA PNEUMONIAE Performed at [facility]    Report Status 08/19/2014 FINAL  Final   Organism ID, Bacteria KLEBSIELLA PNEUMONIAE  Final      Susceptibility   Klebsiella pneumoniae - MIC*    AMPICILLIN >=32 RESISTANT Resistant     CEFAZOLIN <=4 SENSITIVE Sensitive     CEFTRIAXONE <=1 SENSITIVE Sensitive     CIPROFLOXACIN <=0.25 SENSITIVE Sensitive     GENTAMICIN <=1 SENSITIVE Sensitive     LEVOFLOXACIN <=0.12 SENSITIVE Sensitive     NITROFURANTOIN 64 INTERMEDIATE Intermediate     TOBRAMYCIN <=1 SENSITIVE Sensitive     TRIMETH/SULFA <=20 SENSITIVE Sensitive     PIP/TAZO <=4 SENSITIVE  Sensitive     * KLEBSIELLA PNEUMONIAE  Blood Culture (routine x 2)     Status: None (Preliminary result)   Collection Time: 08/17/14  6:04 AM  Result Value Ref Range Status   Specimen Description BLOOD RIGHT HAND  Final   Special Requests BOTTLES DRAWN AEROBIC ONLY 4CC  Final   Culture   Final           BLOOD CULTURE RECEIVED NO GROWTH TO DATE CULTURE WILL BE HELD FOR 5 DAYS BEFORE ISSUING A FINAL NEGATIVE REPORT Performed at [facility]    Report Status PENDING  Incomplete  Blood Culture (routine x 2)     Status: None (Preliminary result)   Collection Time: 08/17/14  6:04 AM  Result Value Ref Range Status   Specimen Description BLOOD RIGHT PORT  Final   Special Requests BOTTLES DRAWN AEROBIC AND ANAEROBIC 5CC  Final   Culture   Final           BLOOD CULTURE RECEIVED NO GROWTH TO DATE CULTURE WILL BE HELD FOR 5 DAYS BEFORE ISSUING A FINAL NEGATIVE REPORT Performed at [facility]    Report Status PENDING  Incomplete  MRSA PCR Screening     Status: None   Collection Time: 08/17/14  8:45 AM  Result Value Ref Range Status   MRSA by PCR NEGATIVE NEGATIVE Final    Comment:        The GeneXpert MRSA Assay (FDA approved for NASAL specimens only), is one component of a comprehensive MRSA colonization surveillance program. It is not intended to diagnose MRSA infection nor to guide or monitor treatment for MRSA infections.   Clostridium Difficile by PCR     Status: Abnormal   Collection Time: 08/17/14  8:50 AM  Result Value Ref Range Status   C difficile by pcr POSITIVE (A) NEGATIVE Final    Comment: CRITICAL RESULT CALLED TO, READ BACK BY AND VERIFIED WITH: S. MAIN RN 11:05 08/17/14 ([email or web address]) Performed at [facility]: Basic Metabolic Panel:  Recent Labs Lab 08/13/14 1012 08/17/14 0439 08/17/14 0444 08/18/14 0410  NA 142 141  --  144  K 3.2* 3.5  --  3.6  CL  --  106  --  112  CO2 24 21  --  24  GLUCOSE 127 174*  --  139*  BUN 4.7* 6   --  7  CREATININE 1.2* 1.33*  --  1.39*  CALCIUM 8.9 8.9  --  8.3*  MG  --   --  1.5  --   PHOS  --   --  3.3  --    Liver Function Tests:  Recent Labs Lab 08/13/14 1012 08/17/14 0439 08/18/14 0410  AST 18 23 39*  ALT $Re'6 8 8  'dWY$ ALKPHOS 70 57 51  BILITOT 0.72 0.8 0.4  PROT 5.8* 5.8* 5.5*  ALBUMIN 3.1* 3.3* 3.0*   No results for input(s): LIPASE, AMYLASE in the last 168 hours. No results for input(s): AMMONIA in the last 168 hours. CBC:  Recent Labs Lab 08/13/14 1012 08/17/14 0439 08/18/14 0410  WBC 4.9 4.3 4.7  NEUTROABS 3.6 2.8  --   HGB 11.7 12.3 10.7*  HCT 37.6 38.7 35.2*  MCV 86.7 88.2 90.3  PLT 222 176 152   Cardiac Enzymes:  Recent Labs Lab 08/17/14 0955 08/17/14 1452 08/17/14 2115 08/18/14 0410 08/19/14 1030  TROPONINI 6.39* 8.82* 8.02* 5.95* 2.85*   BNP: BNP (last 3 results)  Recent Labs  06/29/14 1740 08/17/14 0955  BNP 534.8* 1980.9*    ProBNP (last 3 results) No results for input(s): PROBNP in the last 8760 hours.  CBG:  Recent Labs Lab 08/18/14 0836 08/19/14 0747  GLUCAP 127* 107*       SignedDebbe Odea, MD [facility] 08/19/2014,  3:49 PM

## 2014-08-19 NOTE — Progress Notes (Addendum)
       Patient Name: Cheyenne Riggs Date of Encounter: 08/19/2014    SUBJECTIVE: Quiet 24 hours after tachycardia resolved. Diarrhea is better. Echo shows normal LVF without RWMA>  TELEMETRY:  NSR/SB Filed Vitals:   08/18/14 1131 08/18/14 2047 08/18/14 2048 08/19/14 0515  BP: 150/77 111/79  147/77  Pulse: 74 75  70  Temp: 98 F (36.7 C) 97.7 F (36.5 C)  97.6 F (36.4 C)  TempSrc: Axillary Oral  Oral  Resp: 19 18  18   Height:      Weight:    168 lb 4.8 oz (76.34 kg)  SpO2: 98% 92% 96% 98%    Intake/Output Summary (Last 24 hours) at 08/19/14 0759 Last data filed at 08/19/14 0755  Gross per 24 hour  Intake    860 ml  Output   2475 ml  Net  -1615 ml   LABS: Basic Metabolic Panel:  Recent Labs  08/17/14 0439 08/17/14 0444 08/18/14 0410  NA 141  --  144  K 3.5  --  3.6  CL 106  --  112  CO2 21  --  24  GLUCOSE 174*  --  139*  BUN 6  --  7  CREATININE 1.33*  --  1.39*  CALCIUM 8.9  --  8.3*  MG  --  1.5  --   PHOS  --  3.3  --    CBC:  Recent Labs  08/17/14 0439 08/18/14 0410  WBC 4.3 4.7  NEUTROABS 2.8  --   HGB 12.3 10.7*  HCT 38.7 35.2*  MCV 88.2 90.3  PLT 176 152   Cardiac Enzymes:  Recent Labs  08/17/14 1452 08/17/14 2115 08/18/14 0410  TROPONINI 8.82* 8.02* 5.95*   Radiology/Studies:  No new data  Physical Exam: Blood pressure 147/77, pulse 70, temperature 97.6 F (36.4 C), temperature source Oral, resp. rate 18, height 5\' 3"  (1.6 m), weight 168 lb 4.8 oz (76.34 kg), SpO2 98 %. Weight change: -6 lb 15.5 oz (-3.16 kg)  Wt Readings from Last 3 Encounters:  08/19/14 168 lb 4.8 oz (76.34 kg)  08/13/14 171 lb 14.4 oz (77.973 kg)  08/09/14 175 lb 1.6 oz (79.425 kg)    Chest is clear and no murmur or rub is heard  ASSESSMENT:  1. 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 Elevated troponin related to NSTEMI induced by tachycardia and hypotension related to C diff. LVEF is normal with no RWMA. 3. Breast cancer on  chemo 4. C. Diff enterocolitis  Plan:  1. Needs OP pharmacologic nuclear when stronger. Nahser is primary cardiologist. 2. Continue beta blocker to prevent PSVT 3. Home when strong enough  Signed, Sinclair Grooms 08/19/2014, 7:59 AM

## 2014-08-19 NOTE — Progress Notes (Signed)
Pharmacist Heart Failure Core Measure Documentation  Assessment: Cheyenne Riggs has an EF documented as 35% on 08/18/14 by ECHO.  Rationale: Heart failure patients with left ventricular systolic dysfunction (LVSD) and an EF < 40% should be prescribed an angiotensin converting enzyme inhibitor (ACEI) or angiotensin receptor blocker (ARB) at discharge unless a contraindication is documented in the medical record.  This patient is not currently on an ACEI or ARB for HF.  This note is being placed in the record in order to provide documentation that a contraindication to the use of these agents is present for this encounter.  ACE Inhibitor or Angiotensin Receptor Blocker is contraindicated (specify all that apply)  []   ACEI allergy AND ARB allergy []   Angioedema []   Moderate or severe aortic stenosis []   Hyperkalemia []   Hypotension []   Renal artery stenosis [x]   Worsening renal function, preexisting renal disease or dysfunction   Clovis Riley 08/19/2014 1:42 PM

## 2014-08-19 NOTE — Progress Notes (Signed)
Porta cath deaccessed. Flushed with 10cc NS followed by Heparin 5ml (100u/ml). No bleeding to site, band aid to site for comfort. Sylvia Kondracki M 

## 2014-08-20 ENCOUNTER — Other Ambulatory Visit: Payer: Self-pay | Admitting: Hematology and Oncology

## 2014-08-20 ENCOUNTER — Telehealth: Payer: Self-pay | Admitting: Hematology and Oncology

## 2014-08-20 ENCOUNTER — Other Ambulatory Visit: Payer: Self-pay | Admitting: *Deleted

## 2014-08-20 ENCOUNTER — Telehealth: Payer: Self-pay | Admitting: Cardiovascular Disease

## 2014-08-20 DIAGNOSIS — C50411 Malignant neoplasm of upper-outer quadrant of right female breast: Secondary | ICD-10-CM

## 2014-08-20 NOTE — Telephone Encounter (Signed)
Patient's BP is 132/81 and HR 74. Denies palpitations or any new or worsening sx. States she is aware dizziness could be coming from chemo tx too but just wanted to make MD aware of the dizziness and her not taking any more Metoprolol. Reviewed by Dr. Burt Knack. Stated patient has option of taking Metoprolol half dose or holding it until Dr. Acie Fredrickson reviews and advises her regarding sx/metoprolol. Called patient to advise her of Dr. Antionette Char recommendations and to schedule her an appt with Dr. Acie Fredrickson for May 12th at 4:30 pm (next available). Will further route to Dr. Acie Fredrickson and Christen Bame, RN.

## 2014-08-20 NOTE — Telephone Encounter (Signed)
S/w pt confirming labs/ov and cancelling all chemo apts per 04/07 POF..... KJ

## 2014-08-20 NOTE — Telephone Encounter (Signed)
Pt c/o medication issue:  1. Name of Medication: Metoprolol   2. How are you currently taking this medication (dosage and times per day)? 25mg  2x per day  3. Are you having a reaction (difficulty breathing--STAT)? No  4. What is your medication issue? Dizziness  Pt was prescribed medication by Dr. Angelena Form while in hospital and wants to know if there is something else she could take that wouldn't cause her to be so dizzy all the time.

## 2014-08-20 NOTE — Telephone Encounter (Signed)
Patient reports that since she started the Metoprolol yesterday at the hospital and then took it again last night, she has developed significant dizziness and slight headache. She is currently lying on the couch. She uses a cane and states she is "leery of walking because I don't want to fall". She has a friend that is coming to visit/stay with her at 3:30 pm today that can check her BP. She states her HR is "okay". Denies CP, SOB or other new medications. Is taking in fluids, but not eating due to loss of appetite (secondary to recent chemo). Nurse will call her back around 3:30 pm to check on her and get BP result.

## 2014-08-21 NOTE — Telephone Encounter (Signed)
Left message for patient to call back to discuss Dr. Elmarie Shiley advice

## 2014-08-21 NOTE — Telephone Encounter (Signed)
Received call from patient who states she is not feeling as dizzy today.  Patient reports she has not taken Metoprolol since yesterday morning.  I advised patient that Dr. Acie Fredrickson wants her to resume Metoprolol as soon as possible as this will help improve her heart function; pt's last reported EF was 35%.  Patient states she is not certain as to whether the dizziness is result of Metoprolol and/or chemo but she does feel better today.  I advised patient to resume Metoprolol 25 mg BID and if she can't tolerate to decrease to 1/2 dose and to follow-up on Monday with Dr. Acie Fredrickson.  Patient verbalized understanding and agreement with plan of care.

## 2014-08-21 NOTE — Telephone Encounter (Signed)
Will try to work her in on Monday.

## 2014-08-23 ENCOUNTER — Encounter (HOSPITAL_COMMUNITY): Payer: Self-pay | Admitting: *Deleted

## 2014-08-23 ENCOUNTER — Emergency Department (HOSPITAL_COMMUNITY)
Admission: EM | Admit: 2014-08-23 | Discharge: 2014-08-24 | Disposition: A | Discharge status: Home / Self Care | Payer: Medicare Other | Source: Home / Self Care | Attending: Emergency Medicine | Admitting: Emergency Medicine

## 2014-08-23 ENCOUNTER — Other Ambulatory Visit: Payer: Self-pay | Admitting: Hematology and Oncology

## 2014-08-23 DIAGNOSIS — E876 Hypokalemia: Secondary | ICD-10-CM

## 2014-08-23 DIAGNOSIS — I471 Supraventricular tachycardia: Secondary | ICD-10-CM | POA: Diagnosis not present

## 2014-08-23 DIAGNOSIS — Z515 Encounter for palliative care: Secondary | ICD-10-CM

## 2014-08-23 DIAGNOSIS — R945 Abnormal results of liver function studies: Secondary | ICD-10-CM | POA: Diagnosis not present

## 2014-08-23 DIAGNOSIS — I499 Cardiac arrhythmia, unspecified: Secondary | ICD-10-CM | POA: Diagnosis not present

## 2014-08-23 DIAGNOSIS — Z792 Long term (current) use of antibiotics: Secondary | ICD-10-CM | POA: Insufficient documentation

## 2014-08-23 DIAGNOSIS — F419 Anxiety disorder, unspecified: Secondary | ICD-10-CM | POA: Insufficient documentation

## 2014-08-23 DIAGNOSIS — I5043 Acute on chronic combined systolic (congestive) and diastolic (congestive) heart failure: Secondary | ICD-10-CM | POA: Diagnosis present

## 2014-08-23 DIAGNOSIS — D6959 Other secondary thrombocytopenia: Secondary | ICD-10-CM | POA: Diagnosis not present

## 2014-08-23 DIAGNOSIS — F329 Major depressive disorder, single episode, unspecified: Secondary | ICD-10-CM | POA: Insufficient documentation

## 2014-08-23 DIAGNOSIS — C50419 Malignant neoplasm of upper-outer quadrant of unspecified female breast: Secondary | ICD-10-CM | POA: Diagnosis present

## 2014-08-23 DIAGNOSIS — N390 Urinary tract infection, site not specified: Secondary | ICD-10-CM

## 2014-08-23 DIAGNOSIS — Z90710 Acquired absence of both cervix and uterus: Secondary | ICD-10-CM | POA: Diagnosis not present

## 2014-08-23 DIAGNOSIS — I429 Cardiomyopathy, unspecified: Secondary | ICD-10-CM | POA: Diagnosis present

## 2014-08-23 DIAGNOSIS — M6281 Muscle weakness (generalized): Secondary | ICD-10-CM | POA: Diagnosis not present

## 2014-08-23 DIAGNOSIS — T508X5A Adverse effect of diagnostic agents, initial encounter: Secondary | ICD-10-CM | POA: Diagnosis not present

## 2014-08-23 DIAGNOSIS — N183 Chronic kidney disease, stage 3 unspecified: Secondary | ICD-10-CM

## 2014-08-23 DIAGNOSIS — Z9119 Patient's noncompliance with other medical treatment and regimen: Secondary | ICD-10-CM | POA: Diagnosis present

## 2014-08-23 DIAGNOSIS — Z87442 Personal history of urinary calculi: Secondary | ICD-10-CM

## 2014-08-23 DIAGNOSIS — I495 Sick sinus syndrome: Secondary | ICD-10-CM | POA: Diagnosis present

## 2014-08-23 DIAGNOSIS — N170 Acute kidney failure with tubular necrosis: Principal | ICD-10-CM | POA: Diagnosis present

## 2014-08-23 DIAGNOSIS — N261 Atrophy of kidney (terminal): Secondary | ICD-10-CM | POA: Diagnosis not present

## 2014-08-23 DIAGNOSIS — Z823 Family history of stroke: Secondary | ICD-10-CM | POA: Diagnosis not present

## 2014-08-23 DIAGNOSIS — R2681 Unsteadiness on feet: Secondary | ICD-10-CM | POA: Diagnosis not present

## 2014-08-23 DIAGNOSIS — E43 Unspecified severe protein-calorie malnutrition: Secondary | ICD-10-CM | POA: Diagnosis present

## 2014-08-23 DIAGNOSIS — Z79899 Other long term (current) drug therapy: Secondary | ICD-10-CM | POA: Insufficient documentation

## 2014-08-23 DIAGNOSIS — I480 Paroxysmal atrial fibrillation: Secondary | ICD-10-CM | POA: Diagnosis present

## 2014-08-23 DIAGNOSIS — J9600 Acute respiratory failure, unspecified whether with hypoxia or hypercapnia: Secondary | ICD-10-CM | POA: Diagnosis not present

## 2014-08-23 DIAGNOSIS — M6282 Rhabdomyolysis: Secondary | ICD-10-CM | POA: Diagnosis present

## 2014-08-23 DIAGNOSIS — J90 Pleural effusion, not elsewhere classified: Secondary | ICD-10-CM | POA: Diagnosis not present

## 2014-08-23 DIAGNOSIS — Z9221 Personal history of antineoplastic chemotherapy: Secondary | ICD-10-CM

## 2014-08-23 DIAGNOSIS — N141 Nephropathy induced by other drugs, medicaments and biological substances: Secondary | ICD-10-CM | POA: Diagnosis not present

## 2014-08-23 DIAGNOSIS — Z8639 Personal history of other endocrine, nutritional and metabolic disease: Secondary | ICD-10-CM | POA: Insufficient documentation

## 2014-08-23 DIAGNOSIS — J9621 Acute and chronic respiratory failure with hypoxia: Secondary | ICD-10-CM | POA: Diagnosis not present

## 2014-08-23 DIAGNOSIS — T424X5A Adverse effect of benzodiazepines, initial encounter: Secondary | ICD-10-CM | POA: Diagnosis not present

## 2014-08-23 DIAGNOSIS — R3915 Urgency of urination: Secondary | ICD-10-CM | POA: Diagnosis present

## 2014-08-23 DIAGNOSIS — I252 Old myocardial infarction: Secondary | ICD-10-CM | POA: Diagnosis not present

## 2014-08-23 DIAGNOSIS — Z87891 Personal history of nicotine dependence: Secondary | ICD-10-CM

## 2014-08-23 DIAGNOSIS — L819 Disorder of pigmentation, unspecified: Secondary | ICD-10-CM | POA: Diagnosis not present

## 2014-08-23 DIAGNOSIS — D696 Thrombocytopenia, unspecified: Secondary | ICD-10-CM | POA: Diagnosis not present

## 2014-08-23 DIAGNOSIS — R531 Weakness: Secondary | ICD-10-CM | POA: Diagnosis not present

## 2014-08-23 DIAGNOSIS — K7110 Toxic liver disease with hepatic necrosis, without coma: Secondary | ICD-10-CM | POA: Diagnosis not present

## 2014-08-23 DIAGNOSIS — N189 Chronic kidney disease, unspecified: Secondary | ICD-10-CM | POA: Diagnosis not present

## 2014-08-23 DIAGNOSIS — E872 Acidosis: Secondary | ICD-10-CM | POA: Diagnosis present

## 2014-08-23 DIAGNOSIS — C50411 Malignant neoplasm of upper-outer quadrant of right female breast: Secondary | ICD-10-CM | POA: Diagnosis not present

## 2014-08-23 DIAGNOSIS — R740 Nonspecific elevation of levels of transaminase and lactic acid dehydrogenase [LDH]: Secondary | ICD-10-CM | POA: Diagnosis not present

## 2014-08-23 DIAGNOSIS — R7989 Other specified abnormal findings of blood chemistry: Secondary | ICD-10-CM | POA: Diagnosis not present

## 2014-08-23 DIAGNOSIS — Z8249 Family history of ischemic heart disease and other diseases of the circulatory system: Secondary | ICD-10-CM

## 2014-08-23 DIAGNOSIS — K5730 Diverticulosis of large intestine without perforation or abscess without bleeding: Secondary | ICD-10-CM | POA: Diagnosis not present

## 2014-08-23 DIAGNOSIS — I998 Other disorder of circulatory system: Secondary | ICD-10-CM | POA: Diagnosis not present

## 2014-08-23 DIAGNOSIS — I129 Hypertensive chronic kidney disease with stage 1 through stage 4 chronic kidney disease, or unspecified chronic kidney disease: Secondary | ICD-10-CM | POA: Diagnosis present

## 2014-08-23 DIAGNOSIS — N200 Calculus of kidney: Secondary | ICD-10-CM | POA: Diagnosis not present

## 2014-08-23 DIAGNOSIS — Z9049 Acquired absence of other specified parts of digestive tract: Secondary | ICD-10-CM | POA: Diagnosis present

## 2014-08-23 DIAGNOSIS — N179 Acute kidney failure, unspecified: Secondary | ICD-10-CM | POA: Diagnosis not present

## 2014-08-23 DIAGNOSIS — Z87448 Personal history of other diseases of urinary system: Secondary | ICD-10-CM

## 2014-08-23 DIAGNOSIS — Z853 Personal history of malignant neoplasm of breast: Secondary | ICD-10-CM

## 2014-08-23 DIAGNOSIS — R112 Nausea with vomiting, unspecified: Secondary | ICD-10-CM

## 2014-08-23 DIAGNOSIS — N19 Unspecified kidney failure: Secondary | ICD-10-CM | POA: Diagnosis not present

## 2014-08-23 DIAGNOSIS — Z452 Encounter for adjustment and management of vascular access device: Secondary | ICD-10-CM | POA: Diagnosis not present

## 2014-08-23 DIAGNOSIS — Z8719 Personal history of other diseases of the digestive system: Secondary | ICD-10-CM | POA: Insufficient documentation

## 2014-08-23 DIAGNOSIS — T50995A Adverse effect of other drugs, medicaments and biological substances, initial encounter: Secondary | ICD-10-CM | POA: Diagnosis not present

## 2014-08-23 DIAGNOSIS — E869 Volume depletion, unspecified: Secondary | ICD-10-CM | POA: Diagnosis not present

## 2014-08-23 DIAGNOSIS — K760 Fatty (change of) liver, not elsewhere classified: Secondary | ICD-10-CM | POA: Diagnosis not present

## 2014-08-23 DIAGNOSIS — Z9181 History of falling: Secondary | ICD-10-CM | POA: Diagnosis not present

## 2014-08-23 DIAGNOSIS — R488 Other symbolic dysfunctions: Secondary | ICD-10-CM | POA: Diagnosis not present

## 2014-08-23 DIAGNOSIS — G629 Polyneuropathy, unspecified: Secondary | ICD-10-CM | POA: Diagnosis not present

## 2014-08-23 DIAGNOSIS — I517 Cardiomegaly: Secondary | ICD-10-CM | POA: Diagnosis not present

## 2014-08-23 DIAGNOSIS — I4891 Unspecified atrial fibrillation: Secondary | ICD-10-CM | POA: Diagnosis not present

## 2014-08-23 HISTORY — DX: Malignant neoplasm of unspecified site of unspecified female breast: C50.919

## 2014-08-23 HISTORY — DX: Malignant (primary) neoplasm, unspecified: C80.1

## 2014-08-23 LAB — COMPREHENSIVE METABOLIC PANEL
ALT: 8 U/L (ref 0–35)
ANION GAP: 12 (ref 5–15)
AST: 23 U/L (ref 0–37)
Albumin: 3.3 g/dL — ABNORMAL LOW (ref 3.5–5.2)
Alkaline Phosphatase: 52 U/L (ref 39–117)
BUN: 9 mg/dL (ref 6–23)
CALCIUM: 8.9 mg/dL (ref 8.4–10.5)
CO2: 26 mmol/L (ref 19–32)
CREATININE: 1.13 mg/dL — AB (ref 0.50–1.10)
Chloride: 99 mmol/L (ref 96–112)
GFR calc Af Amer: 57 mL/min — ABNORMAL LOW (ref >90)
GFR calc non Af Amer: 49 mL/min — ABNORMAL LOW (ref >90)
Glucose, Bld: 116 mg/dL — ABNORMAL HIGH (ref 70–99)
Potassium: 2.7 mmol/L — CL (ref 3.5–5.1)
SODIUM: 137 mmol/L (ref 135–145)
Total Bilirubin: 0.8 mg/dL (ref 0.3–1.2)
Total Protein: 6 g/dL (ref 6.0–8.3)

## 2014-08-23 LAB — CULTURE, BLOOD (ROUTINE X 2)
CULTURE: NO GROWTH
CULTURE: NO GROWTH

## 2014-08-23 LAB — LIPASE, BLOOD: Lipase: 23 U/L (ref 11–59)

## 2014-08-23 LAB — CBC WITH DIFFERENTIAL/PLATELET
Basophils Absolute: 0 10*3/uL (ref 0.0–0.1)
Basophils Relative: 0 % (ref 0–1)
EOS PCT: 0 % (ref 0–5)
Eosinophils Absolute: 0 10*3/uL (ref 0.0–0.7)
HCT: 37.2 % (ref 36.0–46.0)
Hemoglobin: 11.8 g/dL — ABNORMAL LOW (ref 12.0–15.0)
LYMPHS ABS: 0.6 10*3/uL — AB (ref 0.7–4.0)
LYMPHS PCT: 13 % (ref 12–46)
MCH: 27.5 pg (ref 26.0–34.0)
MCHC: 31.7 g/dL (ref 30.0–36.0)
MCV: 86.7 fL (ref 78.0–100.0)
MONOS PCT: 11 % (ref 3–12)
Monocytes Absolute: 0.5 10*3/uL (ref 0.1–1.0)
NEUTROS ABS: 3.7 10*3/uL (ref 1.7–7.7)
Neutrophils Relative %: 76 % (ref 43–77)
PLATELETS: 129 10*3/uL — AB (ref 150–400)
RBC: 4.29 MIL/uL (ref 3.87–5.11)
RDW: 17.2 % — AB (ref 11.5–15.5)
WBC: 4.8 10*3/uL (ref 4.0–10.5)

## 2014-08-23 LAB — URINALYSIS, ROUTINE W REFLEX MICROSCOPIC
Bilirubin Urine: NEGATIVE
GLUCOSE, UA: NEGATIVE mg/dL
Hgb urine dipstick: NEGATIVE
KETONES UR: NEGATIVE mg/dL
Leukocytes, UA: NEGATIVE
Nitrite: NEGATIVE
PH: 6 (ref 5.0–8.0)
Protein, ur: NEGATIVE mg/dL
Specific Gravity, Urine: 1.009 (ref 1.005–1.030)
UROBILINOGEN UA: 0.2 mg/dL (ref 0.0–1.0)

## 2014-08-23 LAB — MAGNESIUM: Magnesium: 1.5 mg/dL (ref 1.5–2.5)

## 2014-08-23 MED ORDER — SODIUM CHLORIDE 0.9 % IV BOLUS (SEPSIS)
500.0000 mL | Freq: Once | INTRAVENOUS | Status: AC
  Administered 2014-08-23: 500 mL via INTRAVENOUS

## 2014-08-23 MED ORDER — SODIUM CHLORIDE 0.9 % IV BOLUS (SEPSIS)
500.0000 mL | Freq: Once | INTRAVENOUS | Status: AC
Start: 2014-08-23 — End: 2014-08-23
  Administered 2014-08-23: 500 mL via INTRAVENOUS

## 2014-08-23 MED ORDER — POTASSIUM CHLORIDE CRYS ER 20 MEQ PO TBCR
40.0000 meq | EXTENDED_RELEASE_TABLET | Freq: Once | ORAL | Status: AC
  Administered 2014-08-23: 40 meq via ORAL
  Filled 2014-08-23: qty 2

## 2014-08-23 MED ORDER — POTASSIUM CHLORIDE 10 MEQ/100ML IV SOLN
10.0000 meq | Freq: Once | INTRAVENOUS | Status: AC
  Administered 2014-08-23: 10 meq via INTRAVENOUS
  Filled 2014-08-23: qty 100

## 2014-08-23 MED ORDER — ONDANSETRON HCL 4 MG/2ML IJ SOLN
4.0000 mg | Freq: Once | INTRAMUSCULAR | Status: AC
  Administered 2014-08-23: 4 mg via INTRAVENOUS
  Filled 2014-08-23: qty 2

## 2014-08-23 MED ORDER — POTASSIUM CHLORIDE IN NACL 20-0.9 MEQ/L-% IV SOLN
Freq: Once | INTRAVENOUS | Status: AC
  Administered 2014-08-23: via INTRAVENOUS
  Filled 2014-08-23: qty 1000

## 2014-08-23 MED ORDER — ONDANSETRON 4 MG PO TBDP
4.0000 mg | ORAL_TABLET | Freq: Once | ORAL | Status: AC
  Filled 2014-08-23: qty 1

## 2014-08-23 NOTE — ED Notes (Signed)
Provided ginger ale for fluid challenge.

## 2014-08-23 NOTE — ED Notes (Signed)
Patient requesting blood draw from the [facility]. RN made aware.

## 2014-08-23 NOTE — ED Notes (Addendum)
Pt currently being treated for breast cancer, reports she has chemo 2 weeks ago, which was stopped because she got so nauseas. Pt reports she started vomiting today. Denies pain.  Pt currently being treated for c diff with flagyl. Reports she is allergic to vancomycin. Last diarrhea 2 days ago.

## 2014-08-23 NOTE — ED Notes (Signed)
Patient is aware that a urine sample is needed. Pt reports she does not need to urinate at this time.

## 2014-08-23 NOTE — ED Provider Notes (Signed)
CSN: [id number]     Arrival date & time 08/23/14  1847 History   First MD Initiated Contact with Patient 08/23/14 1915     Chief Complaint  Patient presents with  . ca pt, vomiting   . Weakness     (Consider location/radiation/quality/duration/timing/severity/associated sxs/prior Treatment) HPI  Cheyenne Riggs is a 69 y.o. female with breast cancer, recently admitted for wide complex tachycardia, has C. difficile, is taking Flagyl orally. Has not had chemotherapy in 2 weeks secondary to side effects of nausea complaining of acute onset of nausea and vomiting at 2 AM. Patient is not able to tolerate any fluids. Patient has non-dissolving Zofran which she's been taking with little relief. She denies abdominal pain, fever, chills, cough, chest pain, shortness of breath, increasing peripheral edema, headache, cervicalgia, dysuria, hematuria, decreased urine output, diarrhea or constipation. States that after mastectomy she had good margins, chemotherapy is out of an abundance of caution. She's not taking any dexamethasone at home.   Past Medical History  Diagnosis Date  . Anxiety   . Back pain   . H/O bladder problems   . Cholelithiasis   . Depression   . High blood pressure   . Hypercholesteremia   . Kidney stones   . Gum disease   . Complication of anesthesia     bp goes up  . Wears glasses   . Wears dentures     top  . Cancer   . Breast cancer    Past Surgical History  Procedure Laterality Date  . Colon biopsy    . Mouth surgery    . Spine surgery    . Colonoscopy    . Tubal ligation    . Lithotripsy    . Rhinoplasty    . Back surgery  1989    lumb lam  . Breast biopsy Right 02/05/2014    invasive ductal cncer  . Abdominal hysterectomy  1982  . Cholecystectomy  2011    lap choli  . Radioactive seed guided mastectomy with axillary sentinel lymph node biopsy Right 02/27/2014    Procedure: RADIOACTIVE SEED GUIDED RIGHT PARTIAL MASTECTOMY WITH AXILLARY SENTINEL LYMPH NODE  BIOPSY;  Surgeon: Stark Klein, MD;  Location: [facility];  Service: General;  Laterality: Right;  . Portacath placement N/A 02/27/2014    Procedure: INSERTION PORT-A-CATH;  Surgeon: Stark Klein, MD;  Location: [place] [facility];  Service: General;  Laterality: N/A;   Family History  Problem Relation Age of Onset  . Stroke Mother     onset in her 6's  . Cancer - Other Father     eye cancer, black lung diseae  . Heart failure Sister   . Hypertension Brother    History  Substance Use Topics  . Smoking status: Former Smoker -- 0.00 packs/day    Quit date: 05/16/2014  . Smokeless tobacco: Not on file  . Alcohol Use: No   OB History    No data available     Review of Systems  10 systems reviewed and found to be negative, except as noted in the HPI.   Allergies  Ciprofloxacin; Demerol; Lidocaine; Other; Septra; Codeine; Novocain; and Vancomycin  Home Medications   Prior to Admission medications   Medication Sig Start Date End Date Taking? Authorizing Provider  acetaminophen (TYLENOL) 325 MG tablet Take 650 mg by mouth every 6 (six) hours as needed for headache (headache).    Yes Historical Provider, MD  cholecalciferol (VITAMIN D) 1000 UNITS tablet Take  1,000 Units by mouth daily.    Yes Historical Provider, MD  diphenhydrAMINE (BENADRYL) 25 MG tablet Take 25 mg by mouth daily as needed for allergies (allergies).    Yes Historical Provider, MD  FLUoxetine (PROZAC) 10 MG capsule Take 1 capsule (10 mg total) by mouth daily. 05/26/14  Yes Hosie Poisson, MD  furosemide (LASIX) 40 MG tablet Take 1 tablet (40 mg total) by mouth daily as needed for fluid (Take a lasix 40 mg and Kdur 20 meq if your weight goes up 5 lbs). 07/01/14  Yes Donne Hazel, MD  lidocaine-prilocaine (EMLA) cream Apply 1 application topically once a week. On Chemo Day (Monday). 03/27/14  Yes Historical Provider, MD  loperamide (IMODIUM) 2 MG capsule Take 2 mg by mouth daily as needed for  diarrhea or loose stools (diarrhea).    Yes Historical Provider, MD  LORazepam (ATIVAN) 0.5 MG tablet Take 1 tablet (0.5 mg total) by mouth every 8 (eight) hours as needed. Nausea 08/11/14  Yes Nicholas Lose, MD  metoprolol tartrate (LOPRESSOR) 25 MG tablet Take 1 tablet (25 mg total) by mouth 2 (two) times daily. 08/19/14  Yes Debbe Odea, MD  metroNIDAZOLE (FLAGYL) 500 MG tablet Take 1 tablet (500 mg total) by mouth 3 (three) times daily. One po bid x 15 days 08/01/14  Yes Malvin Johns, MD  ondansetron (ZOFRAN) 8 MG tablet Take 8 mg by mouth daily as needed for nausea or vomiting (nausea and vomiting).  03/30/14  Yes Historical Provider, MD  potassium chloride (MICRO-K) 10 MEQ CR capsule Take 3 capsules (30 mEq total) by mouth daily. 08/03/14  Yes Nicholas Lose, MD  Probiotic Product (PROBIOTIC DAILY PO) Take 1 tablet by mouth daily.    Yes Historical Provider, MD  KLOR-CON M10 10 MEQ tablet TAKE 2 CAPSULES (20 MEQ TOTAL) BY MOUTH DAILY. Patient not taking: Reported on 08/23/2014 08/20/14   Nicholas Lose, MD  ondansetron (ZOFRAN ODT) 4 MG disintegrating tablet 4mg  ODT q4 hours prn nausea/vomit 08/24/14   Devetta Hagenow, PA-C   BP 170/75 mmHg  Pulse 77  Temp(Src) 97.4 F (36.3 C) (Oral)  Resp 17  SpO2 99% Physical Exam  Constitutional: She is oriented to person, place, and time. She appears well-developed and well-nourished. No distress.  Pale  HENT:  Head: Normocephalic and atraumatic.  Dry mucous membranes  Eyes: Conjunctivae and EOM are normal. Pupils are equal, round, and reactive to light.  Neck: Normal range of motion. Neck supple.  Cardiovascular: Normal rate, regular rhythm and intact distal pulses.   Pulmonary/Chest: Effort normal and breath sounds normal. No stridor. No respiratory distress. She has no wheezes. She has no rales. She exhibits no tenderness.  Abdominal: Soft. Bowel sounds are normal. She exhibits no distension and no mass. There is no tenderness. There is no rebound and  no guarding.  Musculoskeletal: Normal range of motion.  Neurological: She is alert and oriented to person, place, and time.  Psychiatric: She has a normal mood and affect.  Nursing note and vitals reviewed.   ED Course  Procedures (including critical care time) Labs Review Labs Reviewed  CBC WITH DIFFERENTIAL/PLATELET - Abnormal; Notable for the following:    Hemoglobin 11.8 (*)    RDW 17.2 (*)    Platelets 129 (*)    Lymphs Abs 0.6 (*)    All other components within normal limits  COMPREHENSIVE METABOLIC PANEL - Abnormal; Notable for the following:    Potassium 2.7 (*)    Glucose, Bld 116 (*)  Creatinine, Ser 1.13 (*)    Albumin 3.3 (*)    GFR calc non Af Amer 49 (*)    GFR calc Af Amer 57 (*)    All other components within normal limits  URINALYSIS, ROUTINE W REFLEX MICROSCOPIC - Abnormal; Notable for the following:    Color, Urine AMBER (*)    All other components within normal limits  LIPASE, BLOOD  MAGNESIUM    Imaging Review No results found.   EKG Interpretation None      MDM   Final diagnoses:  Non-intractable vomiting with nausea, vomiting of unspecified type  Hypokalemia  CKD (chronic kidney disease), stage III    Filed Vitals:   08/23/14 2108 08/23/14 2230 08/23/14 2300 08/23/14 2354  BP: 145/77 172/77 152/73 170/75  Pulse: 68 72 71 77  Temp: 97.9 F (36.6 C)   97.4 F (36.3 C)  TempSrc: Oral   Oral  Resp: 18 20 15 17   SpO2: 95% 96% 96% 99%    Medications  heparin lock flush 100 unit/mL (not administered)  sodium chloride 0.9 % bolus 500 mL (0 mLs Intravenous Stopped 08/23/14 2106)  ondansetron (ZOFRAN-ODT) disintegrating tablet 4 mg (0 mg Oral Return to [facility] 08/23/14 1942)  ondansetron (ZOFRAN) injection 4 mg (4 mg Intravenous Given 08/23/14 2033)  sodium chloride 0.9 % bolus 500 mL (0 mLs Intravenous Stopped 08/23/14 2211)  potassium chloride 10 mEq in 100 mL IVPB (0 mEq Intravenous Stopped 08/23/14 2317)  potassium chloride SA  (K-DUR,KLOR-CON) CR tablet 40 mEq (40 mEq Oral Given 08/23/14 2218)  sodium chloride 0.9 % bolus 500 mL (0 mLs Intravenous Stopped 08/23/14 2351)  0.9 % NaCl with KCl 20 mEq/ L  infusion ( Intravenous New Bag/Given 08/23/14 2353)    Cheyenne Riggs is a pleasant 68 y.o. female with breast cancer, actively undergoing chemotherapy, has not been able to have her chemotherapy in 2 weeks secondary to side effects complaining of acute onset of nausea vomiting, she is not tolerating by mouth appears very dehydrated. Abdominal exam is benign. No signs of systemic infection. Will check basic blood work, hydrate and by mouth challenge.  Blood work with no leukocytosis, potassium is low at 2.7. Creatinine is 1.13 which is not atypical for her recent readings. Patient reports significant improvement in her nausea weakness. He is still not able to give a urine sample.   Patient continues to report improvement. She is able to give a urine sample, there is no signs of infection. Patient is tolerating by mouth. She states that she is taking potassium supplements.  Oncology consult from Dr. Earlie Server appreciated: He's been apprised of the saturation, understands her potassium is low and agrees with patient's stability to discharge to home. He recommends that she follow closely with her oncologist tomorrow. I've offered the patient admission and she has declined. She's tolerating by mouth, will restart taking her Decadron, I have also written her prescription for Zofran ODT.  Discussed case with attending MD who agrees with plan and stability to d/c to home.   Evaluation does not show pathology that would require ongoing emergent intervention or inpatient treatment. Pt is hemodynamically stable and mentating appropriately. Discussed findings and plan with patient/guardian, who agrees with care plan. All questions answered. Return precautions discussed and outpatient follow up given.   New Prescriptions   ONDANSETRON  (ZOFRAN ODT) 4 MG DISINTEGRATING TABLET    4mg  ODT q4 hours prn nausea/vomit         Monico Blitz, PA-C 08/24/14  [place], MD 08/28/14 [phone]

## 2014-08-24 ENCOUNTER — Telehealth: Payer: Self-pay | Admitting: Hematology and Oncology

## 2014-08-24 ENCOUNTER — Other Ambulatory Visit (HOSPITAL_BASED_OUTPATIENT_CLINIC_OR_DEPARTMENT_OTHER): Payer: Medicare Other

## 2014-08-24 ENCOUNTER — Other Ambulatory Visit: Payer: Self-pay | Admitting: *Deleted

## 2014-08-24 ENCOUNTER — Ambulatory Visit: Payer: Medicare Other | Admitting: Cardiovascular Disease

## 2014-08-24 ENCOUNTER — Ambulatory Visit (HOSPITAL_BASED_OUTPATIENT_CLINIC_OR_DEPARTMENT_OTHER): Payer: Medicare Other | Admitting: Nurse Practitioner

## 2014-08-24 ENCOUNTER — Ambulatory Visit: Payer: Medicare Other

## 2014-08-24 ENCOUNTER — Encounter: Payer: Self-pay | Admitting: Nurse Practitioner

## 2014-08-24 ENCOUNTER — Telehealth: Payer: Self-pay | Admitting: *Deleted

## 2014-08-24 VITALS — BP 114/81 | HR 73 | Temp 97.9°F | Resp 16

## 2014-08-24 DIAGNOSIS — C50411 Malignant neoplasm of upper-outer quadrant of right female breast: Secondary | ICD-10-CM

## 2014-08-24 DIAGNOSIS — E860 Dehydration: Secondary | ICD-10-CM | POA: Diagnosis not present

## 2014-08-24 DIAGNOSIS — A0472 Enterocolitis due to Clostridium difficile, not specified as recurrent: Secondary | ICD-10-CM | POA: Insufficient documentation

## 2014-08-24 DIAGNOSIS — R110 Nausea: Secondary | ICD-10-CM | POA: Diagnosis not present

## 2014-08-24 DIAGNOSIS — E876 Hypokalemia: Secondary | ICD-10-CM | POA: Diagnosis not present

## 2014-08-24 DIAGNOSIS — A047 Enterocolitis due to Clostridium difficile: Secondary | ICD-10-CM

## 2014-08-24 DIAGNOSIS — Z7409 Other reduced mobility: Secondary | ICD-10-CM

## 2014-08-24 LAB — BASIC METABOLIC PANEL (CC13)
ANION GAP: 13 meq/L — AB (ref 3–11)
BUN: 8.9 mg/dL (ref 7.0–26.0)
CHLORIDE: 106 meq/L (ref 98–109)
CO2: 24 mEq/L (ref 22–29)
Calcium: 8.9 mg/dL (ref 8.4–10.4)
Creatinine: 1.2 mg/dL — ABNORMAL HIGH (ref 0.6–1.1)
EGFR: 49 mL/min/{1.73_m2} — ABNORMAL LOW (ref >90)
Glucose: 114 mg/dl (ref 70–140)
Potassium: 3.4 mEq/L — ABNORMAL LOW (ref 3.5–5.1)
SODIUM: 143 meq/L (ref 136–145)

## 2014-08-24 MED ORDER — METRONIDAZOLE 500 MG PO TABS: 500.0000 mg | ORAL_TABLET | Freq: Three times a day (TID) | ORAL | Status: DC

## 2014-08-24 MED ORDER — POTASSIUM CHLORIDE ER 10 MEQ PO CPCR: 20.0000 meq | ORAL_CAPSULE | Freq: Two times a day (BID) | ORAL | Status: DC

## 2014-08-24 MED ORDER — SODIUM CHLORIDE 0.9 % IV SOLN
Freq: Once | INTRAVENOUS | Status: AC
  Administered 2014-08-24: 12:00:00 via INTRAVENOUS
  Filled 2014-08-24: qty 1000

## 2014-08-24 MED ORDER — POTASSIUM CHLORIDE ER 10 MEQ PO CPCR: 20.0000 meq | ORAL_CAPSULE | Freq: Every day | ORAL | Status: DC

## 2014-08-24 MED ORDER — ONDANSETRON HCL 40 MG/20ML IJ SOLN: Freq: Once | INTRAMUSCULAR | Status: DC

## 2014-08-24 MED ORDER — SODIUM CHLORIDE 0.9 % IV SOLN: INTRAVENOUS | Status: DC

## 2014-08-24 MED ORDER — ONDANSETRON 4 MG PO TBDP: ORAL_TABLET | ORAL | Status: DC

## 2014-08-24 MED ORDER — SODIUM CHLORIDE 0.9 % IJ SOLN
10.0000 mL | INTRAMUSCULAR | Status: DC | PRN
  Administered 2014-08-24: 10 mL via INTRAVENOUS
  Filled 2014-08-24: qty 10

## 2014-08-24 MED ORDER — HEPARIN SOD (PORK) LOCK FLUSH 100 UNIT/ML IV SOLN
500.0000 [IU] | Freq: Once | INTRAVENOUS | Status: AC
  Administered 2014-08-24: 500 [IU]
  Filled 2014-08-24: qty 5

## 2014-08-24 MED ORDER — SODIUM CHLORIDE 0.9 % IV SOLN: Freq: Once | INTRAVENOUS | Status: DC

## 2014-08-24 MED ORDER — LORAZEPAM 2 MG/ML IJ SOLN: 0.5000 mg | INTRAMUSCULAR | Status: DC | PRN

## 2014-08-24 MED ORDER — HEPARIN SOD (PORK) LOCK FLUSH 100 UNIT/ML IV SOLN
500.0000 [IU] | Freq: Once | INTRAVENOUS | Status: AC
  Administered 2014-08-24: 500 [IU] via INTRAVENOUS
  Filled 2014-08-24: qty 5

## 2014-08-24 MED ORDER — SODIUM CHLORIDE 0.9 % IV SOLN
Freq: Once | INTRAVENOUS | Status: AC
  Administered 2014-08-24: 12:00:00 via INTRAVENOUS
  Filled 2014-08-24: qty 4

## 2014-08-24 NOTE — Discharge Instructions (Signed)
Start taking her dexamethasone and continue to take your potassium supplements (K Dur 3 tabs daily)  You can try the Zofran which dissolves underneath her time, please do not combine it with the pill form of Zofran.  Push fluids. Do not hesitate to return to the emergency room for any new, worsening or concerning symptoms. Nausea and Vomiting Nausea is a sick feeling that often comes before throwing up (vomiting). Vomiting is a reflex where stomach contents come out of your mouth. Vomiting can cause severe loss of body fluids (dehydration). Children and elderly adults can become dehydrated quickly, especially if they also have diarrhea. Nausea and vomiting are symptoms of a condition or disease. It is important to find the cause of your symptoms. CAUSES   Direct irritation of the stomach lining. This irritation can result from increased acid production (gastroesophageal reflux disease), infection, food poisoning, taking certain medicines (such as nonsteroidal anti-inflammatory drugs), alcohol use, or tobacco use.  Signals from the brain.These signals could be caused by a headache, heat exposure, an inner ear disturbance, increased pressure in the brain from injury, infection, a tumor, or a concussion, pain, emotional stimulus, or metabolic problems.  An obstruction in the gastrointestinal tract (bowel obstruction).  Illnesses such as diabetes, hepatitis, gallbladder problems, appendicitis, kidney problems, cancer, sepsis, atypical symptoms of a heart attack, or eating disorders.  Medical treatments such as chemotherapy and radiation.  Receiving medicine that makes you sleep (general anesthetic) during surgery. DIAGNOSIS Your caregiver may ask for tests to be done if the problems do not improve after a few days. Tests may also be done if symptoms are severe or if the reason for the nausea and vomiting is not clear. Tests may include:  Urine tests.  Blood tests.  Stool tests.  Cultures  (to look for evidence of infection).  X-rays or other imaging studies. Test results can help your caregiver make decisions about treatment or the need for additional tests. TREATMENT You need to stay well hydrated. Drink frequently but in small amounts.You may wish to drink water, sports drinks, clear broth, or eat frozen ice pops or gelatin dessert to help stay hydrated.When you eat, eating slowly may help prevent nausea.There are also some antinausea medicines that may help prevent nausea. HOME CARE INSTRUCTIONS   Take all medicine as directed by your caregiver.  If you do not have an appetite, do not force yourself to eat. However, you must continue to drink fluids.  If you have an appetite, eat a normal diet unless your caregiver tells you differently.  Eat a variety of complex carbohydrates (rice, wheat, potatoes, bread), lean meats, yogurt, fruits, and vegetables.  Avoid high-fat foods because they are more difficult to digest.  Drink enough water and fluids to keep your urine clear or pale yellow.  If you are dehydrated, ask your caregiver for specific rehydration instructions. Signs of dehydration may include:  Severe thirst.  Dry lips and mouth.  Dizziness.  Dark urine.  Decreasing urine frequency and amount.  Confusion.  Rapid breathing or pulse. SEEK IMMEDIATE MEDICAL CARE IF:   You have blood or brown flecks (like coffee grounds) in your vomit.  You have black or bloody stools.  You have a severe headache or stiff neck.  You are confused.  You have severe abdominal pain.  You have chest pain or trouble breathing.  You do not urinate at least once every 8 hours.  You develop cold or clammy skin.  You continue to vomit for longer than  24 to 48 hours.  You have a fever. MAKE SURE YOU:   Understand these instructions.  Will watch your condition.  Will get help right away if you are not doing well or get worse. Document Released: 05/01/2005  Document Revised: 07/24/2011 Document Reviewed: 09/28/2010 [facility] Patient Information 2015 [place], [place]. This information is not intended to replace advice given to you by your health care provider. Make sure you discuss any questions you have with your health care provider.

## 2014-08-24 NOTE — Assessment & Plan Note (Signed)
Potassium while in the emergency department last night was 2.7.  Patient did receive IV fluids with potassium while in the emergency department.  Patient states that she has potassium capsules at home she is prescribed as well; but she has not been able to take them due to chronic nausea.  Patient received potassium 20 mEq in her IV fluid rehydration today.  On recheck of potassium later today-potassium was 3.4.  Refilled patient's potassium capsules today per her request.  Instructed patient to take potassium 20 mEq only once per day for the time being.

## 2014-08-24 NOTE — Progress Notes (Signed)
SYMPTOM MANAGEMENT CLINIC   HPI: Cheyenne Riggs 69 y.o. female diagnosed with breast cancer.  Currently undergoing Abraxane chemotherapy regimen.  Patient has been suffering with chronic nausea since initiating her chemotherapy.  She has been trying Zofran and Ativan at home with only minimal effectiveness.  She admits to poor oral intake; and feels very dehydrated today.  She actually presented to the emergency department just last night for same complaint; and received IV fluid rehydration while in the emergency department.  She also was diagnosed with hypokalemia; with a potassium of only 2.7.  She was given potassium the IV while in the emergency department last night.  Patient has potassium capsules at home; this been too nauseous to take them recently.  Also, patient has been diagnosed with the past several weeks with C. difficile as well.  She states she completed 15 days of Flagyl therapy just yesterday.  She has had 3 episodes of diarrhea again today.  Patient denies any recent fevers or chills.  Also, patients family are requesting some home health and transportation assistance if at all possible.  Patient lives alone; and needs assistance with putting her medications in her pill management box, assistance with ADLs, light cooking and cleaning, as well as ambulation.  HPI  ROS  Past Medical History  Diagnosis Date  . Anxiety   . Back pain   . H/O bladder problems   . Cholelithiasis   . Depression   . High blood pressure   . Hypercholesteremia   . Kidney stones   . Gum disease   . Complication of anesthesia     bp goes up  . Wears glasses   . Wears dentures     top  . Cancer   . Breast cancer     Past Surgical History  Procedure Laterality Date  . Colon biopsy    . Mouth surgery    . Spine surgery    . Colonoscopy    . Tubal ligation    . Lithotripsy    . Rhinoplasty    . Back surgery  1989    lumb lam  . Breast biopsy Right 02/05/2014    invasive ductal  cncer  . Abdominal hysterectomy  1982  . Cholecystectomy  2011    lap choli  . Radioactive seed guided mastectomy with axillary sentinel lymph node biopsy Right 02/27/2014    Procedure: RADIOACTIVE SEED GUIDED RIGHT PARTIAL MASTECTOMY WITH AXILLARY SENTINEL LYMPH NODE BIOPSY;  Surgeon: Stark Klein, MD;  Location: [facility];  Service: General;  Laterality: Right;  . Portacath placement N/A 02/27/2014    Procedure: INSERTION PORT-A-CATH;  Surgeon: Stark Klein, MD;  Location: [facility];  Service: General;  Laterality: N/A;    has Breast cancer of upper-outer quadrant of right female breast; Essential hypertension, benign; Enteritis due to Clostridium difficile; Antineoplastic chemotherapy induced anemia; Elevated troponin; Wide-complex tachycardia; UTI (lower urinary tract infection); Protein-calorie malnutrition, severe; CKD (chronic kidney disease), stage III; Acute systolic heart failure; Clostridium difficile colitis; Nausea without vomiting; Dehydration; Hypokalemia; and Immobility on her problem list.    is allergic to ciprofloxacin; demerol; lidocaine; other; septra; codeine; novocain; and vancomycin.    Medication List       This list is accurate as of: 08/24/14  5:45 PM.  Always use your most recent med list.               acetaminophen 325 MG tablet  Commonly known as:  TYLENOL  Take 650 mg by mouth every 6 (six) hours as needed for headache (headache).     cholecalciferol 1000 UNITS tablet  Commonly known as:  VITAMIN D  Take 1,000 Units by mouth daily.     diphenhydrAMINE 25 MG tablet  Commonly known as:  BENADRYL  Take 25 mg by mouth daily as needed for allergies (allergies).     FLUoxetine 10 MG capsule  Commonly known as:  PROZAC  Take 1 capsule (10 mg total) by mouth daily.     lidocaine-prilocaine cream  Commonly known as:  EMLA  Apply 1 application topically once a week. On Chemo Day (Monday).     loperamide 2 MG capsule    Commonly known as:  IMODIUM  Take 2 mg by mouth daily as needed for diarrhea or loose stools (diarrhea).     LORazepam 0.5 MG tablet  Commonly known as:  ATIVAN  Take 1 tablet (0.5 mg total) by mouth every 8 (eight) hours as needed. Nausea     metoprolol tartrate 25 MG tablet  Commonly known as:  LOPRESSOR  Take 1 tablet (25 mg total) by mouth 2 (two) times daily.     metroNIDAZOLE 500 MG tablet  Commonly known as:  FLAGYL  Take 1 tablet (500 mg total) by mouth 3 (three) times daily. One po bid x 15 days     ondansetron 4 MG disintegrating tablet  Commonly known as:  ZOFRAN ODT  67m ODT q4 hours prn nausea/vomit     ondansetron 8 MG tablet  Commonly known as:  ZOFRAN  Take 8 mg by mouth daily as needed for nausea or vomiting (nausea and vomiting).     potassium chloride 10 MEQ CR capsule  Commonly known as:  MICRO-K  Take 2 capsules (20 mEq total) by mouth daily.     PROBIOTIC DAILY PO  Take 1 tablet by mouth daily.         PHYSICAL EXAMINATION  Oncology Vitals 08/24/2014 08/24/2014 08/24/2014 08/24/2014 08/24/2014 08/23/2014 08/23/2014  Height - - - - - - -  Weight - - - - - - -  Weight (lbs) - - - - - - -  BMI (kg/m2) - - - - - - -  Temp - - - 97.9 97.6 97.4 -  Pulse 73 71 70 - 72 77 71  Resp - - - _0 SpO2 - - - - 99 99 96  BSA (m2) - - - - - - -   BP Readings from Last 3 Encounters:  08/24/14 114/81  08/19/14 147/77  08/13/14 147/70    Physical Exam  Constitutional: She is oriented to person, place, and time.  Patient appears fatigued, weak, pale, and chronically ill.  HENT:  Head: Normocephalic and atraumatic.  Mouth/Throat: Oropharynx is clear and moist.  Eyes: Conjunctivae and EOM are normal. Pupils are equal, round, and reactive to light. Right eye exhibits no discharge. Left eye exhibits no discharge. No scleral icterus.  Neck: Normal range of motion. Neck supple. No JVD present. No tracheal deviation present. No thyromegaly present.   Cardiovascular: Normal rate, regular rhythm, normal heart sounds and intact distal pulses.   Pulmonary/Chest: Effort normal and breath sounds normal. No respiratory distress. She has no wheezes. She has no rales. She exhibits no tenderness.  Abdominal: Soft. Bowel sounds are normal. She exhibits no distension and no mass. There is no tenderness. There is no rebound and no guarding.  Musculoskeletal: Normal range of motion. She  exhibits no edema or tenderness.  Lymphadenopathy:    She has no cervical adenopathy.  Neurological: She is alert and oriented to person, place, and time.  Patient was in a wheelchair initially; and required assistance to transfer to the exam chair.  Skin: Skin is warm and dry. No rash noted. No erythema. There is pallor.  Psychiatric: Affect normal.  Nursing note and vitals reviewed.   LABORATORY DATA:. Appointment on 08/24/2014  Component Date Value Ref Range Status  . Sodium 08/24/2014 143  136 - 145 mEq/L Final  . Potassium 08/24/2014 3.4* 3.5 - 5.1 mEq/L Final  . Chloride 08/24/2014 106  98 - 109 mEq/L Final  . CO2 08/24/2014 24  22 - 29 mEq/L Final  . Glucose 08/24/2014 114  70 - 140 mg/dl Final  . BUN 08/24/2014 8.9  7.0 - 26.0 mg/dL Final  . Creatinine 08/24/2014 1.2* 0.6 - 1.1 mg/dL Final  . Calcium 08/24/2014 8.9  8.4 - 10.4 mg/dL Final  . Anion Gap 08/24/2014 13* 3 - 11 mEq/L Final  . EGFR 08/24/2014 49* >90 ml/min/1.73 m2 Final   eGFR is calculated using the CKD-EPI Creatinine Equation (2009)  Admission on 08/23/2014, Discharged on 08/24/2014  Component Date Value Ref Range Status  . WBC 08/23/2014 4.8  4.0 - 10.5 K/uL Final  . RBC 08/23/2014 4.29  3.87 - 5.11 MIL/uL Final  . Hemoglobin 08/23/2014 11.8* 12.0 - 15.0 g/dL Final  . HCT 08/23/2014 37.2  36.0 - 46.0 % Final  . MCV 08/23/2014 86.7  78.0 - 100.0 fL Final  . MCH 08/23/2014 27.5  26.0 - 34.0 pg Final  . MCHC 08/23/2014 31.7  30.0 - 36.0 g/dL Final  . RDW 08/23/2014 17.2* 11.5 - 15.5 %  Final  . Platelets 08/23/2014 129* 150 - 400 K/uL Final   Comment: SPECIMEN CHECKED FOR CLOTS REPEATED TO VERIFY PLATELET COUNT CONFIRMED BY SMEAR LARGE PLATELETS PRESENT   . Neutrophils Relative % 08/23/2014 76  43 - 77 % Final  . Lymphocytes Relative 08/23/2014 13  12 - 46 % Final  . Monocytes Relative 08/23/2014 11  3 - 12 % Final  . Eosinophils Relative 08/23/2014 0  0 - 5 % Final  . Basophils Relative 08/23/2014 0  0 - 1 % Final  . Neutro Abs 08/23/2014 3.7  1.7 - 7.7 K/uL Final  . Lymphs Abs 08/23/2014 0.6* 0.7 - 4.0 K/uL Final  . Monocytes Absolute 08/23/2014 0.5  0.1 - 1.0 K/uL Final  . Eosinophils Absolute 08/23/2014 0.0  0.0 - 0.7 K/uL Final  . Basophils Absolute 08/23/2014 0.0  0.0 - 0.1 K/uL Final  . RBC Morphology 08/23/2014 TEARDROP CELLS   Final  . Smear Review 08/23/2014 LARGE PLATELETS PRESENT   Final  . Sodium 08/23/2014 137  135 - 145 mmol/L Final  . Potassium 08/23/2014 2.7* 3.5 - 5.1 mmol/L Final   Comment: CRITICAL RESULT CALLED TO, READ BACK BY AND VERIFIED WITH: SPOKE WITH DENNIS,A [id number] COVINGTON,N   . Chloride 08/23/2014 99  96 - 112 mmol/L Final  . CO2 08/23/2014 26  19 - 32 mmol/L Final  . Glucose, Bld 08/23/2014 116* 70 - 99 mg/dL Final  . BUN 08/23/2014 9  6 - 23 mg/dL Final  . Creatinine, Ser 08/23/2014 1.13* 0.50 - 1.10 mg/dL Final  . Calcium 08/23/2014 8.9  8.4 - 10.5 mg/dL Final  . Total Protein 08/23/2014 6.0  6.0 - 8.3 g/dL Final  . Albumin 08/23/2014 3.3* 3.5 - 5.2 g/dL Final  .  AST 08/23/2014 23  0 - 37 U/L Final  . ALT 08/23/2014 8  0 - 35 U/L Final  . Alkaline Phosphatase 08/23/2014 52  39 - 117 U/L Final  . Total Bilirubin 08/23/2014 0.8  0.3 - 1.2 mg/dL Final  . GFR calc non Af Amer 08/23/2014 49* >90 mL/min Final  . GFR calc Af Amer 08/23/2014 57* >90 mL/min Final   Comment: (NOTE) The eGFR has been calculated using the CKD EPI equation. This calculation has not been validated in all clinical situations. eGFR's persistently <90  mL/min signify possible Chronic Kidney Disease.   . Anion gap 08/23/2014 12  5 - 15 Final  . Lipase 08/23/2014 23  11 - 59 U/L Final  . Color, Urine 08/23/2014 AMBER* YELLOW Final   BIOCHEMICALS MAY BE AFFECTED BY COLOR  . APPearance 08/23/2014 CLEAR  CLEAR Final  . Specific Gravity, Urine 08/23/2014 1.009  1.005 - 1.030 Final  . pH 08/23/2014 6.0  5.0 - 8.0 Final  . Glucose, UA 08/23/2014 NEGATIVE  NEGATIVE mg/dL Final  . Hgb urine dipstick 08/23/2014 NEGATIVE  NEGATIVE Final  . Bilirubin Urine 08/23/2014 NEGATIVE  NEGATIVE Final  . Ketones, ur 08/23/2014 NEGATIVE  NEGATIVE mg/dL Final  . Protein, ur 08/23/2014 NEGATIVE  NEGATIVE mg/dL Final  . Urobilinogen, UA 08/23/2014 0.2  0.0 - 1.0 mg/dL Final  . Nitrite 08/23/2014 NEGATIVE  NEGATIVE Final  . Leukocytes, UA 08/23/2014 NEGATIVE  NEGATIVE Final   MICROSCOPIC NOT DONE ON URINES WITH NEGATIVE PROTEIN, BLOOD, LEUKOCYTES, NITRITE, OR GLUCOSE <1000 mg/dL.  . Magnesium 08/23/2014 1.5  1.5 - 2.5 mg/dL Final     RADIOGRAPHIC STUDIES: No results found.  ASSESSMENT/PLAN:    Breast cancer of upper-outer quadrant of right female breast Patient received her last Abraxane chemotherapy on 07/27/2014.  Since patient very poorly tolerates chemotherapy-decision was made to hold any further chemotherapy.  Will obtain a restaging breast MRI as soon as possible; and patient will then follow back up with Dr. Barry Dienes general surgeon to arrange for surgery.  Patient will return this coming Wednesday, 08/26/2014 and Friday, 08/28/2014 for additional IV fluid rehydration.  Patient will also require a follow-up visit post-surgery.    Clostridium difficile colitis Patient was previously diagnosed with C. difficile.  She completed 2 weeks of Flagyl just yesterday.  She reports having 3 episodes of diarrhea earlier this morning.  Since patient is allergic to vancomycin-will refill the Flagyl antibiotics for further treatment of patient's symptomatic C.  difficile.   Nausea without vomiting Patient continues with chronic nausea.  She presented to the emergency department just yesterday evening for complaints of nausea, poor oral intake, and dehydration.  She was given IV fluid rehydration while in the emergency department.  Patient continues to complain of nausea again today; despite using both Zofran and Ativan at home.  Most likely, the nausea is a chemotherapy side effect.  On exam-patient does appear fairly dehydrated.  Creatinine has increased to 1.2.  Patient was given IV fluid rehydration while at the [facility] today.  She was also given Zofran IV while at the cancer center.   Dehydration Patient continues with chronic nausea.  She presented to the emergency department just yesterday evening for complaints of nausea, poor oral intake, and dehydration.  She was given IV fluid rehydration while in the emergency department.  Patient continues to complain of nausea again today; despite using both Zofran and Ativan at home.  Most likely, the nausea is a chemotherapy side effect.  On  exam-patient does appear fairly dehydrated.  Creatinine has increased to 1.2.  Patient was given IV fluid rehydration while at the [facility] today.  She was also given Zofran IV while at the cancer center.  Patient will return this coming Wednesday, 08/26/2014 for additional IV fluid rehydration.  Patient will also return on Friday, 08/28/2014 for labs, follow up visit, and additional IV fluid rehydration.  She was also encouraged to push fluids is much as possible.    Hypokalemia Potassium while in the emergency department last night was 2.7.  Patient did receive IV fluids with potassium while in the emergency department.  Patient states that she has potassium capsules at home she is prescribed as well; but she has not been able to take them due to chronic nausea.  Patient received potassium 20 mEq in her IV fluid rehydration today.  On recheck  of potassium later today-potassium was 3.4.  Refilled patient's potassium capsules today per her request.  Instructed patient to take potassium 20 mEq only once per day for the time being.   Immobility Patient has been suffering with chemotherapy side effects which include nausea, fatigue, and some generalized weakness.  Patient is suffering with chronic deconditioning and worsening mobility issues.  She requires assistance with all ambulation.  She was in a wheelchair when she arrived to the exam room today.  She is able to transfer from wheelchair to a chair only with assistance.  Per patient and family's request-ordered a home health referral; as well as an [facility] referral for transportation needs.   Patient stated understanding of all instructions; and was in agreement with this plan of care. The patient knows to call the clinic with any problems, questions or concerns.   Review/collaboration with Dr. Lindi Adie regarding all aspects of patient's visit today.   Total time spent with patient was 40 minutes;  with greater than 75 percent of that time spent in face to face counseling regarding patient's symptoms,  and coordination of care and follow up.  Disclaimer: This note was dictated with voice recognition software. Similar sounding words can inadvertently be transcribed and may not be corrected upon review.   Drue Second, NP 08/24/2014

## 2014-08-24 NOTE — Assessment & Plan Note (Signed)
Patient was previously diagnosed with C. difficile.  She completed 2 weeks of Flagyl just yesterday.  She reports having 3 episodes of diarrhea earlier this morning.  Since patient is allergic to vancomycin-will refill the Flagyl antibiotics for further treatment of patient's symptomatic C. difficile.

## 2014-08-24 NOTE — Patient Instructions (Signed)
Hypokalemia Hypokalemia means that the amount of potassium in the blood is lower than normal.Potassium is a chemical, called an electrolyte, that helps regulate the amount of fluid in the body. It also stimulates muscle contraction and helps nerves function properly.Most of the body's potassium is inside of cells, and only a very small amount is in the blood. Because the amount in the blood is so small, minor changes can be life-threatening. CAUSES  Antibiotics.  Diarrhea or vomiting.  Using laxatives too much, which can cause diarrhea.  Chronic kidney disease.  Water pills (diuretics).  Eating disorders (bulimia).  Low magnesium level.  Sweating a lot. SIGNS AND SYMPTOMS  Weakness.  Constipation.  Fatigue.  Muscle cramps.  Mental confusion.  Skipped heartbeats or irregular heartbeat (palpitations).  Tingling or numbness. DIAGNOSIS  Your health care provider can diagnose hypokalemia with blood tests. In addition to checking your potassium level, your health care provider may also check other lab tests. TREATMENT Hypokalemia can be treated with potassium supplements taken by mouth or adjustments in your current medicines. If your potassium level is very low, you may need to get potassium through a vein (IV) and be monitored in the hospital. A diet high in potassium is also helpful. Foods high in potassium are:  Nuts, such as peanuts and pistachios.  Seeds, such as sunflower seeds and pumpkin seeds.  Peas, lentils, and lima beans.  Whole grain and bran cereals and breads.  Fresh fruit and vegetables, such as apricots, avocado, bananas, cantaloupe, kiwi, oranges, tomatoes, asparagus, and potatoes.  Orange and tomato juices.  Red meats.  Fruit yogurt. HOME CARE INSTRUCTIONS  Take all medicines as prescribed by your health care provider.  Maintain a healthy diet by including nutritious food, such as fruits, vegetables, nuts, whole grains, and lean meats.  If  you are taking a laxative, be sure to follow the directions on the label. SEEK MEDICAL CARE IF:  Your weakness gets worse.  You feel your heart pounding or racing.  You are vomiting or having diarrhea.  You are diabetic and having trouble keeping your blood glucose in the normal range. SEEK IMMEDIATE MEDICAL CARE IF:  You have chest pain, shortness of breath, or dizziness.  You are vomiting or having diarrhea for more than 2 days.  You faint. MAKE SURE YOU:   Understand these instructions.  Will watch your condition.  Will get help right away if you are not doing well or get worse. Document Released: 05/01/2005 Document Revised: 02/19/2013 Document Reviewed: 11/01/2012 [employer] [facility] Patient Information 2015 [place], [place]. This information is not intended to replace advice given to you by your health care provider. Make sure you discuss any questions you have with your health care provider.  Dehydration, Adult Dehydration means your body does not have as much fluid as it needs. Your kidneys, brain, and heart will not work properly without the right amount of fluids and salt.  HOME CARE  Ask your doctor how to replace body fluid losses (rehydrate).  Drink enough fluids to keep your pee (urine) clear or pale yellow.  Drink small amounts of fluids often if you feel sick to your stomach (nauseous) or throw up (vomit).  Eat like you normally do.  Avoid:  Foods or drinks high in sugar.  Bubbly (carbonated) drinks.  Juice.  Very hot or cold fluids.  Drinks with caffeine.  Fatty, greasy foods.  Alcohol.  Tobacco.  Eating too much.  Gelatin desserts.  Wash your hands to avoid spreading germs (bacteria,  viruses).  Only take medicine as told by your doctor.  Keep all doctor visits as told. GET HELP RIGHT AWAY IF:   You cannot drink something without throwing up.  You get worse even with treatment.  Your vomit has blood in it or looks greenish.  Your poop  (stool) has blood in it or looks black and tarry.  You have not peed in 6 to 8 hours.  You pee a small amount of very dark pee.  You have a fever.  You pass out (faint).  You have belly (abdominal) pain that gets worse or stays in one spot (localizes).  You have a rash, stiff neck, or bad headache.  You get easily annoyed, sleepy, or are hard to wake up.  You feel weak, dizzy, or very thirsty. MAKE SURE YOU:   Understand these instructions.  Will watch your condition.  Will get help right away if you are not doing well or get worse. Document Released: 02/25/2009 Document Revised: 07/24/2011 Document Reviewed: 12/19/2010 [facility] Patient Information 2015 [place], [place]. This information is not intended to replace advice given to you by your health care provider. Make sure you discuss any questions you have with your health care provider.

## 2014-08-24 NOTE — Assessment & Plan Note (Signed)
Patient continues with chronic nausea.  She presented to the emergency department just yesterday evening for complaints of nausea, poor oral intake, and dehydration.  She was given IV fluid rehydration while in the emergency department.  Patient continues to complain of nausea again today; despite using both Zofran and Ativan at home.  Most likely, the nausea is a chemotherapy side effect.  On exam-patient does appear fairly dehydrated.  Creatinine has increased to 1.2.  Patient was given IV fluid rehydration while at the [facility] today.  She was also given Zofran IV while at the cancer center.  Patient will return this coming Wednesday, 08/26/2014 for additional IV fluid rehydration.  Patient will also return on Friday, 08/28/2014 for labs, follow up visit, and additional IV fluid rehydration.  She was also encouraged to push fluids is much as possible.

## 2014-08-24 NOTE — Assessment & Plan Note (Signed)
Patient has been suffering with chemotherapy side effects which include nausea, fatigue, and some generalized weakness.  Patient is suffering with chronic deconditioning and worsening mobility issues.  She requires assistance with all ambulation.  She was in a wheelchair when she arrived to the exam room today.  She is able to transfer from wheelchair to a chair only with assistance.  Per patient and family's request-ordered a home health referral; as well as an [facility] referral for transportation needs.

## 2014-08-24 NOTE — Telephone Encounter (Signed)
Added IVF's 4/13 and 4/15. Other appointments already on schedule. Gave patient avs report and appointments for April

## 2014-08-24 NOTE — Telephone Encounter (Signed)
Confirm appointments for 04/13-04/15 °

## 2014-08-24 NOTE — Assessment & Plan Note (Signed)
Patient continues with chronic nausea.  She presented to the emergency department just yesterday evening for complaints of nausea, poor oral intake, and dehydration.  She was given IV fluid rehydration while in the emergency department.  Patient continues to complain of nausea again today; despite using both Zofran and Ativan at home.  Most likely, the nausea is a chemotherapy side effect.  On exam-patient does appear fairly dehydrated.  Creatinine has increased to 1.2.  Patient was given IV fluid rehydration while at the [facility] today.  She was also given Zofran IV while at the cancer center.

## 2014-08-24 NOTE — Telephone Encounter (Signed)
PT. HAD IV ZOFRAN IN THE EMERGENCY DEPARTMENT AND WAS DISCHARGED AT 1:30AM. SHE TOOK ZOFRAN AT 2:00AM AND LORAZEPAM AT 3:30AM AND IS STILL NAUSEATED BUT NO VOMITING. PT. IS TAKING JUST SIPS OF GINGER ALE. SHE HAS HAD THREE DIARRHEA STOOLS SINCE RETURNING HOME. PT. HAS TAKEN TWO IMODIUM. PT. MENTIONED SHE HAD TAKEN THE LAST DOSE OF A FIFTEEN DAY COURSE OF FLAGYL ON 08/23/14 FOR C DIFF.

## 2014-08-24 NOTE — Assessment & Plan Note (Signed)
Patient received her last Abraxane chemotherapy on 07/27/2014.  Since patient very poorly tolerates chemotherapy-decision was made to hold any further chemotherapy.  Will obtain a restaging breast MRI as soon as possible; and patient will then follow back up with Dr. Barry Dienes general surgeon to arrange for surgery.  Patient will return this coming Wednesday, 08/26/2014 and Friday, 08/28/2014 for additional IV fluid rehydration.  Patient will also require a follow-up visit post-surgery.

## 2014-08-25 ENCOUNTER — Encounter (HOSPITAL_COMMUNITY): Payer: Self-pay | Admitting: Family Medicine

## 2014-08-25 ENCOUNTER — Other Ambulatory Visit: Payer: Self-pay | Admitting: *Deleted

## 2014-08-25 ENCOUNTER — Emergency Department (HOSPITAL_COMMUNITY)
Admission: EM | Admit: 2014-08-25 | Discharge: 2014-08-25 | Disposition: A | Discharge status: Home / Self Care | Payer: Medicare Other | Source: Home / Self Care | Attending: Emergency Medicine | Admitting: Emergency Medicine

## 2014-08-25 ENCOUNTER — Telehealth: Payer: Self-pay | Admitting: Nurse Practitioner

## 2014-08-25 ENCOUNTER — Other Ambulatory Visit: Payer: Self-pay | Admitting: Nurse Practitioner

## 2014-08-25 ENCOUNTER — Telehealth: Payer: Self-pay | Admitting: *Deleted

## 2014-08-25 DIAGNOSIS — R0602 Shortness of breath: Secondary | ICD-10-CM

## 2014-08-25 DIAGNOSIS — Z8742 Personal history of other diseases of the female genital tract: Secondary | ICD-10-CM | POA: Insufficient documentation

## 2014-08-25 DIAGNOSIS — R531 Weakness: Secondary | ICD-10-CM | POA: Diagnosis not present

## 2014-08-25 DIAGNOSIS — C50411 Malignant neoplasm of upper-outer quadrant of right female breast: Secondary | ICD-10-CM

## 2014-08-25 DIAGNOSIS — Z853 Personal history of malignant neoplasm of breast: Secondary | ICD-10-CM

## 2014-08-25 DIAGNOSIS — R404 Transient alteration of awareness: Secondary | ICD-10-CM | POA: Diagnosis not present

## 2014-08-25 DIAGNOSIS — F329 Major depressive disorder, single episode, unspecified: Secondary | ICD-10-CM

## 2014-08-25 DIAGNOSIS — Z8719 Personal history of other diseases of the digestive system: Secondary | ICD-10-CM | POA: Insufficient documentation

## 2014-08-25 DIAGNOSIS — Z972 Presence of dental prosthetic device (complete) (partial): Secondary | ICD-10-CM

## 2014-08-25 DIAGNOSIS — F419 Anxiety disorder, unspecified: Secondary | ICD-10-CM

## 2014-08-25 DIAGNOSIS — Z792 Long term (current) use of antibiotics: Secondary | ICD-10-CM

## 2014-08-25 DIAGNOSIS — Z79899 Other long term (current) drug therapy: Secondary | ICD-10-CM | POA: Insufficient documentation

## 2014-08-25 DIAGNOSIS — E780 Pure hypercholesterolemia: Secondary | ICD-10-CM

## 2014-08-25 DIAGNOSIS — T424X5A Adverse effect of benzodiazepines, initial encounter: Secondary | ICD-10-CM | POA: Insufficient documentation

## 2014-08-25 DIAGNOSIS — Z87442 Personal history of urinary calculi: Secondary | ICD-10-CM

## 2014-08-25 DIAGNOSIS — Z87891 Personal history of nicotine dependence: Secondary | ICD-10-CM

## 2014-08-25 DIAGNOSIS — T50905A Adverse effect of unspecified drugs, medicaments and biological substances, initial encounter: Secondary | ICD-10-CM

## 2014-08-25 MED ORDER — HEPARIN SOD (PORK) LOCK FLUSH 100 UNIT/ML IV SOLN
500.0000 [IU] | Freq: Once | INTRAVENOUS | Status: AC
  Administered 2014-08-25: 500 [IU]
  Filled 2014-08-25: qty 5

## 2014-08-25 NOTE — Telephone Encounter (Signed)
Cheyenne Riggs is not a hospice candidate. Can you please have social work talk to her about what her needs specifically are? Thanks

## 2014-08-25 NOTE — ED Provider Notes (Signed)
CSN: [id number]     Arrival date & time 08/25/14  1953 History   First MD Initiated Contact with Patient 08/25/14 2152     Chief Complaint  Patient presents with  . Weakness     (Consider location/radiation/quality/duration/timing/severity/associated sxs/prior Treatment) HPI Comments: 69 yo female with generalized weakness which began shortly after taking a double dose of her ativan.  At time of my initial evaluation, she stated that she felt completely better.  She denied any symptoms and was convinced that her generalized weakness was secondary to ativan use.    Patient is a 69 y.o. female presenting with weakness.  Weakness This is a new problem. The current episode started 3 to 5 hours ago. The problem occurs constantly. The problem has been resolved. Associated symptoms include shortness of breath (progressive for several weeks). Pertinent negatives include no chest pain and no abdominal pain. Nothing aggravates the symptoms. Nothing relieves the symptoms. Treatments tried: rest.    Past Medical History  Diagnosis Date  . Anxiety   . Back pain   . H/O bladder problems   . Cholelithiasis   . Depression   . High blood pressure   . Hypercholesteremia   . Kidney stones   . Gum disease   . Complication of anesthesia     bp goes up  . Wears glasses   . Wears dentures     top  . Cancer   . Breast cancer   . Kidney stones    Past Surgical History  Procedure Laterality Date  . Colon biopsy    . Mouth surgery    . Spine surgery    . Colonoscopy    . Tubal ligation    . Lithotripsy    . Rhinoplasty    . Back surgery  1989    lumb lam  . Breast biopsy Right 02/05/2014    invasive ductal cncer  . Abdominal hysterectomy  1982  . Cholecystectomy  2011    lap choli  . Radioactive seed guided mastectomy with axillary sentinel lymph node biopsy Right 02/27/2014    Procedure: RADIOACTIVE SEED GUIDED RIGHT PARTIAL MASTECTOMY WITH AXILLARY SENTINEL LYMPH NODE BIOPSY;  Surgeon:  Stark Klein, MD;  Location: [place];  Service: General;  Laterality: Right;  . Portacath placement N/A 02/27/2014    Procedure: INSERTION PORT-A-CATH;  Surgeon: Stark Klein, MD;  Location: [place];  Service: General;  Laterality: N/A;   Family History  Problem Relation Age of Onset  . Stroke Mother     onset in her 49's  . Cancer - Other Father     eye cancer, black lung diseae  . Heart failure Sister   . Hypertension Brother    History  Substance Use Topics  . Smoking status: Former Smoker -- 0.00 packs/day    Quit date: 05/16/2014  . Smokeless tobacco: Not on file  . Alcohol Use: No   OB History    No data available     Review of Systems  Respiratory: Positive for shortness of breath (progressive for several weeks).   Cardiovascular: Negative for chest pain.  Gastrointestinal: Negative for abdominal pain.  Neurological: Positive for weakness.  All other systems reviewed and are negative.     Allergies  Ciprofloxacin; Demerol; Lidocaine; Other; Septra; Codeine; Novocain; and Vancomycin  Home Medications   Prior to Admission medications   Medication Sig Start Date End Date Taking? Authorizing Provider  acetaminophen (TYLENOL) 325 MG tablet Take 650 mg by  mouth every 6 (six) hours as needed for headache (headache).    Yes Historical Provider, MD  cholecalciferol (VITAMIN D) 1000 UNITS tablet Take 1,000 Units by mouth daily.    Yes Historical Provider, MD  diphenhydrAMINE (BENADRYL) 25 MG tablet Take 25 mg by mouth daily as needed for allergies (allergies).    Yes Historical Provider, MD  FLUoxetine (PROZAC) 10 MG capsule Take 1 capsule (10 mg total) by mouth daily. 05/26/14  Yes Hosie Poisson, MD  furosemide (LASIX) 40 MG tablet Take 40 mg by mouth as needed for fluid (take with klor con for 5lb weight gain).  07/01/14  Yes Historical Provider, MD  lidocaine-prilocaine (EMLA) cream Apply 1 application topically once a week. On Chemo Day  (Monday). 03/27/14  Yes Historical Provider, MD  loperamide (IMODIUM) 2 MG capsule Take 2 mg by mouth daily as needed for diarrhea or loose stools (diarrhea).    Yes Historical Provider, MD  LORazepam (ATIVAN) 0.5 MG tablet Take 1 tablet (0.5 mg total) by mouth every 8 (eight) hours as needed. Nausea 08/11/14  Yes Nicholas Lose, MD  metoprolol tartrate (LOPRESSOR) 25 MG tablet Take 1 tablet (25 mg total) by mouth 2 (two) times daily. 08/19/14  Yes Debbe Odea, MD  metroNIDAZOLE (FLAGYL) 500 MG tablet Take 1 tablet (500 mg total) by mouth 3 (three) times daily. One po bid x 15 days 08/24/14  Yes Susanne Borders, NP  ondansetron (ZOFRAN ODT) 4 MG disintegrating tablet 4mg  ODT q4 hours prn nausea/vomit Patient taking differently: Take 4 mg by mouth every 4 (four) hours as needed for nausea or vomiting.  08/24/14  Yes Nicole Pisciotta, PA-C  ondansetron (ZOFRAN) 8 MG tablet Take 8 mg by mouth daily as needed for nausea or vomiting (nausea and vomiting).  03/30/14  Yes Historical Provider, MD  potassium chloride (MICRO-K) 10 MEQ CR capsule Take 2 capsules (20 mEq total) by mouth daily. 08/24/14  Yes Susanne Borders, NP  PRESCRIPTION MEDICATION Chemo [place]   Yes Historical Provider, MD  Probiotic Product (PROBIOTIC DAILY PO) Take 1 tablet by mouth daily.    Yes Historical Provider, MD   BP 125/56 mmHg  Pulse 64  Temp(Src) 97.6 F (36.4 C) (Oral)  Resp 24  Ht 5\' 3"  (1.6 m)  Wt 168 lb 4 oz (76.318 kg)  BMI 29.81 kg/m2  SpO2 98% Physical Exam  Constitutional: She is oriented to person, place, and time. She appears well-developed and well-nourished. No distress.  HENT:  Head: Normocephalic and atraumatic.  Mouth/Throat: Oropharynx is clear and moist.  Eyes: Conjunctivae are normal. Pupils are equal, round, and reactive to light. No scleral icterus.  Neck: Neck supple.  Cardiovascular: Normal rate, regular rhythm, normal heart sounds and intact distal pulses.   No murmur heard. Pulmonary/Chest: Effort  normal and breath sounds normal. No stridor. No respiratory distress. She has no rales.  Abdominal: Soft. Bowel sounds are normal. She exhibits no distension. There is no tenderness.  Musculoskeletal: Normal range of motion.  Neurological: She is alert and oriented to person, place, and time. She has normal strength. No cranial nerve deficit or sensory deficit.  Skin: Skin is warm and dry. No rash noted.  Psychiatric: She has a normal mood and affect. Her behavior is normal.  Nursing note and vitals reviewed.   ED Course  Procedures (including critical care time) Labs Review Labs Reviewed - No data to display  Imaging Review No results found.   EKG Interpretation None  MDM   Final diagnoses:  Weakness  Medication adverse effect, initial encounter    Up and ambulated with a walker which she described as baseline.  She said she was feeling better and didn't want to have any tests run.  She will see her oncologist tomorrow.  Given return precautions.     Serita Grit, MD 08/26/14 [phone]

## 2014-08-25 NOTE — Telephone Encounter (Signed)
Called pt to follow up from [facility] appt on  08/24/14. Pt stated weak but no diarrhea continues to take zofran for nausea and still taking  antibiotic. Reminded pt of next office visit. Pt verbalized understanding.

## 2014-08-25 NOTE — ED Notes (Signed)
Pt called EMS for generalized weakness  Pt states she took her ativan too close together and is feeling weak  Pt states she tried to walk from chair to bed approximately 10 feet and could not make it  Pt did not fall  No other complaints

## 2014-08-25 NOTE — ED Notes (Signed)
Pt ambulated with assistance of walker. Pt states she ambulated as she normally does. Denies any dizziness and improved weakness.

## 2014-08-25 NOTE — Telephone Encounter (Signed)
Home health referral and [place] referral for transportation placed by [facility] yesterday. I called and spoke with Vernie Shanks about seeing the patient tomorrow while in infusion to assess for further needs. Abby Potash will meet with patient tomorrow.

## 2014-08-25 NOTE — Telephone Encounter (Signed)
PT. STATES SHE NEEDS MORE HELP. THIS NOTE WAS ROUTED TO [place]

## 2014-08-26 ENCOUNTER — Ambulatory Visit: Payer: Medicare Other | Admitting: Hematology and Oncology

## 2014-08-26 ENCOUNTER — Ambulatory Visit: Payer: Medicare Other

## 2014-08-26 ENCOUNTER — Encounter: Payer: Self-pay | Admitting: *Deleted

## 2014-08-26 ENCOUNTER — Encounter (HOSPITAL_COMMUNITY): Payer: Self-pay | Admitting: *Deleted

## 2014-08-26 ENCOUNTER — Telehealth: Payer: Self-pay | Admitting: *Deleted

## 2014-08-26 ENCOUNTER — Emergency Department (HOSPITAL_COMMUNITY): Payer: Medicare Other

## 2014-08-26 ENCOUNTER — Inpatient Hospital Stay (HOSPITAL_COMMUNITY)
Admission: EM | Admit: 2014-08-26 | Discharge: 2014-09-04 | DRG: 682 | Disposition: A | Discharge status: Skilled Nursing Facility | Payer: Medicare Other | Attending: Internal Medicine | Admitting: Internal Medicine

## 2014-08-26 ENCOUNTER — Other Ambulatory Visit: Payer: Medicare Other

## 2014-08-26 DIAGNOSIS — D696 Thrombocytopenia, unspecified: Secondary | ICD-10-CM | POA: Insufficient documentation

## 2014-08-26 DIAGNOSIS — N390 Urinary tract infection, site not specified: Secondary | ICD-10-CM | POA: Diagnosis not present

## 2014-08-26 DIAGNOSIS — I495 Sick sinus syndrome: Secondary | ICD-10-CM | POA: Diagnosis present

## 2014-08-26 DIAGNOSIS — R531 Weakness: Secondary | ICD-10-CM | POA: Diagnosis not present

## 2014-08-26 DIAGNOSIS — C50411 Malignant neoplasm of upper-outer quadrant of right female breast: Secondary | ICD-10-CM | POA: Diagnosis not present

## 2014-08-26 DIAGNOSIS — Z79899 Other long term (current) drug therapy: Secondary | ICD-10-CM | POA: Diagnosis not present

## 2014-08-26 DIAGNOSIS — I4891 Unspecified atrial fibrillation: Secondary | ICD-10-CM | POA: Diagnosis not present

## 2014-08-26 DIAGNOSIS — M6281 Muscle weakness (generalized): Secondary | ICD-10-CM | POA: Diagnosis not present

## 2014-08-26 DIAGNOSIS — Z823 Family history of stroke: Secondary | ICD-10-CM | POA: Diagnosis not present

## 2014-08-26 DIAGNOSIS — N179 Acute kidney failure, unspecified: Secondary | ICD-10-CM | POA: Diagnosis not present

## 2014-08-26 DIAGNOSIS — D6959 Other secondary thrombocytopenia: Secondary | ICD-10-CM | POA: Diagnosis not present

## 2014-08-26 DIAGNOSIS — Z87891 Personal history of nicotine dependence: Secondary | ICD-10-CM | POA: Diagnosis not present

## 2014-08-26 DIAGNOSIS — E43 Unspecified severe protein-calorie malnutrition: Secondary | ICD-10-CM | POA: Diagnosis present

## 2014-08-26 DIAGNOSIS — R7989 Other specified abnormal findings of blood chemistry: Secondary | ICD-10-CM | POA: Diagnosis present

## 2014-08-26 DIAGNOSIS — T508X5A Adverse effect of diagnostic agents, initial encounter: Secondary | ICD-10-CM | POA: Diagnosis not present

## 2014-08-26 DIAGNOSIS — K7110 Toxic liver disease with hepatic necrosis, without coma: Secondary | ICD-10-CM | POA: Diagnosis not present

## 2014-08-26 DIAGNOSIS — K5730 Diverticulosis of large intestine without perforation or abscess without bleeding: Secondary | ICD-10-CM | POA: Diagnosis not present

## 2014-08-26 DIAGNOSIS — Z9119 Patient's noncompliance with other medical treatment and regimen: Secondary | ICD-10-CM | POA: Diagnosis present

## 2014-08-26 DIAGNOSIS — R945 Abnormal results of liver function studies: Secondary | ICD-10-CM | POA: Diagnosis not present

## 2014-08-26 DIAGNOSIS — I252 Old myocardial infarction: Secondary | ICD-10-CM | POA: Diagnosis not present

## 2014-08-26 DIAGNOSIS — R2681 Unsteadiness on feet: Secondary | ICD-10-CM | POA: Diagnosis not present

## 2014-08-26 DIAGNOSIS — I499 Cardiac arrhythmia, unspecified: Secondary | ICD-10-CM | POA: Diagnosis not present

## 2014-08-26 DIAGNOSIS — Z515 Encounter for palliative care: Secondary | ICD-10-CM | POA: Diagnosis not present

## 2014-08-26 DIAGNOSIS — K7290 Hepatic failure, unspecified without coma: Secondary | ICD-10-CM

## 2014-08-26 DIAGNOSIS — F419 Anxiety disorder, unspecified: Secondary | ICD-10-CM | POA: Diagnosis present

## 2014-08-26 DIAGNOSIS — N141 Nephropathy induced by other drugs, medicaments and biological substances: Secondary | ICD-10-CM | POA: Diagnosis not present

## 2014-08-26 DIAGNOSIS — J90 Pleural effusion, not elsewhere classified: Secondary | ICD-10-CM | POA: Diagnosis not present

## 2014-08-26 DIAGNOSIS — M6282 Rhabdomyolysis: Secondary | ICD-10-CM | POA: Diagnosis present

## 2014-08-26 DIAGNOSIS — I5043 Acute on chronic combined systolic (congestive) and diastolic (congestive) heart failure: Secondary | ICD-10-CM | POA: Diagnosis present

## 2014-08-26 DIAGNOSIS — N189 Chronic kidney disease, unspecified: Secondary | ICD-10-CM

## 2014-08-26 DIAGNOSIS — I480 Paroxysmal atrial fibrillation: Secondary | ICD-10-CM | POA: Diagnosis not present

## 2014-08-26 DIAGNOSIS — J9621 Acute and chronic respiratory failure with hypoxia: Secondary | ICD-10-CM | POA: Diagnosis not present

## 2014-08-26 DIAGNOSIS — N19 Unspecified kidney failure: Secondary | ICD-10-CM | POA: Diagnosis present

## 2014-08-26 DIAGNOSIS — N183 Chronic kidney disease, stage 3 (moderate): Secondary | ICD-10-CM | POA: Diagnosis present

## 2014-08-26 DIAGNOSIS — N261 Atrophy of kidney (terminal): Secondary | ICD-10-CM | POA: Diagnosis not present

## 2014-08-26 DIAGNOSIS — I998 Other disorder of circulatory system: Secondary | ICD-10-CM | POA: Diagnosis not present

## 2014-08-26 DIAGNOSIS — N200 Calculus of kidney: Secondary | ICD-10-CM | POA: Diagnosis not present

## 2014-08-26 DIAGNOSIS — Z8249 Family history of ischemic heart disease and other diseases of the circulatory system: Secondary | ICD-10-CM | POA: Diagnosis not present

## 2014-08-26 DIAGNOSIS — R3915 Urgency of urination: Secondary | ICD-10-CM | POA: Diagnosis present

## 2014-08-26 DIAGNOSIS — C50419 Malignant neoplasm of upper-outer quadrant of unspecified female breast: Secondary | ICD-10-CM | POA: Diagnosis present

## 2014-08-26 DIAGNOSIS — Z87442 Personal history of urinary calculi: Secondary | ICD-10-CM | POA: Diagnosis not present

## 2014-08-26 DIAGNOSIS — Z9181 History of falling: Secondary | ICD-10-CM | POA: Diagnosis not present

## 2014-08-26 DIAGNOSIS — L819 Disorder of pigmentation, unspecified: Secondary | ICD-10-CM | POA: Diagnosis not present

## 2014-08-26 DIAGNOSIS — J9600 Acute respiratory failure, unspecified whether with hypoxia or hypercapnia: Secondary | ICD-10-CM | POA: Diagnosis not present

## 2014-08-26 DIAGNOSIS — Z9221 Personal history of antineoplastic chemotherapy: Secondary | ICD-10-CM | POA: Diagnosis not present

## 2014-08-26 DIAGNOSIS — R7401 Elevation of levels of liver transaminase levels: Secondary | ICD-10-CM | POA: Diagnosis present

## 2014-08-26 DIAGNOSIS — Z9049 Acquired absence of other specified parts of digestive tract: Secondary | ICD-10-CM | POA: Diagnosis present

## 2014-08-26 DIAGNOSIS — R488 Other symbolic dysfunctions: Secondary | ICD-10-CM | POA: Diagnosis not present

## 2014-08-26 DIAGNOSIS — R740 Nonspecific elevation of levels of transaminase and lactic acid dehydrogenase [LDH]: Secondary | ICD-10-CM | POA: Diagnosis not present

## 2014-08-26 DIAGNOSIS — T50995A Adverse effect of other drugs, medicaments and biological substances, initial encounter: Secondary | ICD-10-CM | POA: Diagnosis not present

## 2014-08-26 DIAGNOSIS — I471 Supraventricular tachycardia: Secondary | ICD-10-CM | POA: Diagnosis not present

## 2014-08-26 DIAGNOSIS — G629 Polyneuropathy, unspecified: Secondary | ICD-10-CM | POA: Diagnosis not present

## 2014-08-26 DIAGNOSIS — I129 Hypertensive chronic kidney disease with stage 1 through stage 4 chronic kidney disease, or unspecified chronic kidney disease: Secondary | ICD-10-CM | POA: Diagnosis present

## 2014-08-26 DIAGNOSIS — I517 Cardiomegaly: Secondary | ICD-10-CM | POA: Diagnosis not present

## 2014-08-26 DIAGNOSIS — G5793 Unspecified mononeuropathy of bilateral lower limbs: Secondary | ICD-10-CM | POA: Insufficient documentation

## 2014-08-26 DIAGNOSIS — K760 Fatty (change of) liver, not elsewhere classified: Secondary | ICD-10-CM | POA: Diagnosis not present

## 2014-08-26 DIAGNOSIS — Z90710 Acquired absence of both cervix and uterus: Secondary | ICD-10-CM | POA: Diagnosis not present

## 2014-08-26 DIAGNOSIS — Z452 Encounter for adjustment and management of vascular access device: Secondary | ICD-10-CM | POA: Diagnosis not present

## 2014-08-26 DIAGNOSIS — L97519 Non-pressure chronic ulcer of other part of right foot with unspecified severity: Secondary | ICD-10-CM | POA: Insufficient documentation

## 2014-08-26 DIAGNOSIS — E869 Volume depletion, unspecified: Secondary | ICD-10-CM | POA: Diagnosis not present

## 2014-08-26 DIAGNOSIS — L97529 Non-pressure chronic ulcer of other part of left foot with unspecified severity: Secondary | ICD-10-CM

## 2014-08-26 DIAGNOSIS — E872 Acidosis: Secondary | ICD-10-CM | POA: Diagnosis present

## 2014-08-26 DIAGNOSIS — I429 Cardiomyopathy, unspecified: Secondary | ICD-10-CM | POA: Diagnosis present

## 2014-08-26 DIAGNOSIS — N170 Acute kidney failure with tubular necrosis: Secondary | ICD-10-CM | POA: Diagnosis present

## 2014-08-26 LAB — CBC WITH DIFFERENTIAL/PLATELET
Basophils Absolute: 0 10*3/uL (ref 0.0–0.1)
Basophils Relative: 0 % (ref 0–1)
EOS ABS: 0 10*3/uL (ref 0.0–0.7)
EOS PCT: 0 % (ref 0–5)
HEMATOCRIT: 37.5 % (ref 36.0–46.0)
Hemoglobin: 12.1 g/dL (ref 12.0–15.0)
LYMPHS ABS: 1 10*3/uL (ref 0.7–4.0)
Lymphocytes Relative: 9 % — ABNORMAL LOW (ref 12–46)
MCH: 28.2 pg (ref 26.0–34.0)
MCHC: 32.3 g/dL (ref 30.0–36.0)
MCV: 87.4 fL (ref 78.0–100.0)
MONOS PCT: 8 % (ref 3–12)
Monocytes Absolute: 0.9 10*3/uL (ref 0.1–1.0)
NEUTROS PCT: 83 % — AB (ref 43–77)
Neutro Abs: 8.7 10*3/uL — ABNORMAL HIGH (ref 1.7–7.7)
Platelets: 68 10*3/uL — ABNORMAL LOW (ref 150–400)
RBC: 4.29 MIL/uL (ref 3.87–5.11)
RDW: 18.3 % — ABNORMAL HIGH (ref 11.5–15.5)
WBC: 10.6 10*3/uL — AB (ref 4.0–10.5)

## 2014-08-26 LAB — COMPREHENSIVE METABOLIC PANEL
ALBUMIN: 3.1 g/dL — AB (ref 3.5–5.2)
ALK PHOS: 307 U/L — AB (ref 39–117)
ALT: 215 U/L — ABNORMAL HIGH (ref 0–35)
ALT: 221 U/L — AB (ref 0–35)
ANION GAP: 14 (ref 5–15)
AST: 1113 U/L — ABNORMAL HIGH (ref 0–37)
AST: 1027 U/L — ABNORMAL HIGH (ref 0–37)
Albumin: 3.3 g/dL — ABNORMAL LOW (ref 3.5–5.2)
Alkaline Phosphatase: 303 U/L — ABNORMAL HIGH (ref 39–117)
Anion gap: 14 (ref 5–15)
BUN: 16 mg/dL (ref 6–23)
BUN: 17 mg/dL (ref 6–23)
CO2: 21 mmol/L (ref 19–32)
CO2: 18 mmol/L — ABNORMAL LOW (ref 19–32)
CREATININE: 2.31 mg/dL — AB (ref 0.50–1.10)
CREATININE: 2.32 mg/dL — AB (ref 0.50–1.10)
Calcium: 9.4 mg/dL (ref 8.4–10.5)
Calcium: 9 mg/dL (ref 8.4–10.5)
Chloride: 103 mmol/L (ref 96–112)
Chloride: 106 mmol/L (ref 96–112)
GFR calc Af Amer: 24 mL/min — ABNORMAL LOW (ref >90)
GFR calc non Af Amer: 21 mL/min — ABNORMAL LOW (ref >90)
GFR, EST AFRICAN AMERICAN: 24 mL/min — AB (ref >90)
GFR, EST NON AFRICAN AMERICAN: 20 mL/min — AB (ref >90)
GLUCOSE: 101 mg/dL — AB (ref 70–99)
Glucose, Bld: 85 mg/dL (ref 70–99)
POTASSIUM: 4.4 mmol/L (ref 3.5–5.1)
Potassium: 4.5 mmol/L (ref 3.5–5.1)
Sodium: 138 mmol/L (ref 135–145)
Sodium: 138 mmol/L (ref 135–145)
TOTAL PROTEIN: 6 g/dL (ref 6.0–8.3)
TOTAL PROTEIN: 5.7 g/dL — AB (ref 6.0–8.3)
Total Bilirubin: 3.2 mg/dL — ABNORMAL HIGH (ref 0.3–1.2)
Total Bilirubin: 3.6 mg/dL — ABNORMAL HIGH (ref 0.3–1.2)

## 2014-08-26 LAB — URINALYSIS, ROUTINE W REFLEX MICROSCOPIC
GLUCOSE, UA: NEGATIVE mg/dL
Ketones, ur: NEGATIVE mg/dL
Nitrite: POSITIVE — AB
PROTEIN: 30 mg/dL — AB
Specific Gravity, Urine: 1.018 (ref 1.005–1.030)
Urobilinogen, UA: 1 mg/dL (ref 0.0–1.0)
pH: 5 (ref 5.0–8.0)

## 2014-08-26 LAB — LACTIC ACID, PLASMA: LACTIC ACID, VENOUS: 3.7 mmol/L — AB (ref 0.5–2.0)

## 2014-08-26 LAB — HEPATITIS PANEL, ACUTE
HCV Ab: NEGATIVE
HEP A IGM: NONREACTIVE
HEP B C IGM: NONREACTIVE
Hepatitis B Surface Ag: NEGATIVE

## 2014-08-26 LAB — URINE MICROSCOPIC-ADD ON

## 2014-08-26 LAB — PHOSPHORUS: Phosphorus: 6.9 mg/dL — ABNORMAL HIGH (ref 2.3–4.6)

## 2014-08-26 LAB — LIPASE, BLOOD: Lipase: 16 U/L (ref 11–59)

## 2014-08-26 LAB — LACTATE DEHYDROGENASE: LDH: 1962 U/L — AB (ref 94–250)

## 2014-08-26 LAB — MAGNESIUM: Magnesium: 1.8 mg/dL (ref 1.5–2.5)

## 2014-08-26 MED ORDER — LORAZEPAM 0.5 MG PO TABS
0.5000 mg | ORAL_TABLET | Freq: Three times a day (TID) | ORAL | Status: DC | PRN
  Administered 2014-08-27: 0.5 mg via ORAL
  Filled 2014-08-26: qty 1

## 2014-08-26 MED ORDER — MORPHINE SULFATE 4 MG/ML IJ SOLN
4.0000 mg | Freq: Once | INTRAMUSCULAR | Status: AC
  Administered 2014-08-26: 4 mg via INTRAVENOUS
  Filled 2014-08-26: qty 1

## 2014-08-26 MED ORDER — DIPHENHYDRAMINE HCL 25 MG PO TABS
25.0000 mg | ORAL_TABLET | Freq: Every day | ORAL | Status: DC | PRN
  Filled 2014-08-26: qty 1

## 2014-08-26 MED ORDER — POTASSIUM CHLORIDE ER 10 MEQ PO CPCR: 20.0000 meq | ORAL_CAPSULE | Freq: Every day | ORAL | Status: DC

## 2014-08-26 MED ORDER — SODIUM CHLORIDE 0.9 % IV SOLN: INTRAVENOUS | Status: DC

## 2014-08-26 MED ORDER — ONDANSETRON HCL 4 MG PO TABS: 4.0000 mg | ORAL_TABLET | Freq: Four times a day (QID) | ORAL | Status: DC | PRN

## 2014-08-26 MED ORDER — FLUOXETINE HCL 10 MG PO CAPS
10.0000 mg | ORAL_CAPSULE | Freq: Every day | ORAL | Status: DC
  Administered 2014-08-27: 10 mg via ORAL
  Filled 2014-08-26 (×2): qty 1

## 2014-08-26 MED ORDER — ONDANSETRON 4 MG PO TBDP: 4.0000 mg | ORAL_TABLET | ORAL | Status: DC | PRN

## 2014-08-26 MED ORDER — ACETAMINOPHEN 325 MG PO TABS
650.0000 mg | ORAL_TABLET | Freq: Four times a day (QID) | ORAL | Status: DC | PRN
  Administered 2014-08-27: 650 mg via ORAL
  Filled 2014-08-26: qty 2

## 2014-08-26 MED ORDER — SODIUM CHLORIDE 0.9 % IJ SOLN
3.0000 mL | Freq: Two times a day (BID) | INTRAMUSCULAR | Status: DC
  Administered 2014-08-28 – 2014-09-01 (×6): 3 mL via INTRAVENOUS
  Administered 2014-09-01: 10 mL via INTRAVENOUS

## 2014-08-26 MED ORDER — METRONIDAZOLE 500 MG PO TABS: 500.0000 mg | ORAL_TABLET | Freq: Three times a day (TID) | ORAL | Status: DC

## 2014-08-26 MED ORDER — FUROSEMIDE 10 MG/ML IJ SOLN
20.0000 mg | Freq: Two times a day (BID) | INTRAMUSCULAR | Status: DC
  Administered 2014-08-27: 20 mg via INTRAVENOUS
  Filled 2014-08-26: qty 2

## 2014-08-26 MED ORDER — METOPROLOL TARTRATE 25 MG PO TABS
25.0000 mg | ORAL_TABLET | Freq: Two times a day (BID) | ORAL | Status: DC
  Administered 2014-08-26 – 2014-08-27 (×2): 25 mg via ORAL
  Filled 2014-08-26 (×2): qty 1

## 2014-08-26 MED ORDER — ENSURE ENLIVE PO LIQD: 237.0000 mL | Freq: Two times a day (BID) | ORAL | Status: DC

## 2014-08-26 MED ORDER — ENOXAPARIN SODIUM 30 MG/0.3ML ~~LOC~~ SOLN: 30.0000 mg | SUBCUTANEOUS | Status: DC

## 2014-08-26 MED ORDER — ONDANSETRON HCL 4 MG/2ML IJ SOLN
4.0000 mg | Freq: Four times a day (QID) | INTRAMUSCULAR | Status: DC | PRN
  Administered 2014-08-28: 4 mg via INTRAVENOUS
  Filled 2014-08-26: qty 2

## 2014-08-26 MED ORDER — SODIUM CHLORIDE 0.9 % IV BOLUS (SEPSIS)
2000.0000 mL | Freq: Once | INTRAVENOUS | Status: AC
  Administered 2014-08-26: 2000 mL via INTRAVENOUS

## 2014-08-26 MED ORDER — POTASSIUM CHLORIDE CRYS ER 20 MEQ PO TBCR
20.0000 meq | EXTENDED_RELEASE_TABLET | Freq: Every day | ORAL | Status: DC
  Administered 2014-08-27 – 2014-08-28 (×2): 20 meq via ORAL
  Filled 2014-08-26 (×2): qty 1

## 2014-08-26 MED ORDER — IOHEXOL 300 MG/ML  SOLN
50.0000 mL | Freq: Once | INTRAMUSCULAR | Status: AC | PRN
  Administered 2014-08-26: 50 mL via ORAL

## 2014-08-26 MED ORDER — IOHEXOL 300 MG/ML  SOLN: 50.0000 mL | Freq: Once | INTRAMUSCULAR | Status: DC | PRN

## 2014-08-26 MED ORDER — ONDANSETRON HCL 4 MG/2ML IJ SOLN
4.0000 mg | Freq: Once | INTRAMUSCULAR | Status: AC
  Administered 2014-08-26: 4 mg via INTRAVENOUS
  Filled 2014-08-26: qty 2

## 2014-08-26 NOTE — ED Notes (Signed)
Bed: WA08 Expected date: 08/26/14 Expected time: 10:18 AM Means of arrival:  Comments: Cancer pt dizziness

## 2014-08-26 NOTE — H&P (Signed)
[facility] History and Physical  Cheyenne Riggs CLE:[id number] DOB: Nov 10, 1945 DOA: 08/26/2014  Referring physician: ED physician PCP: Gennette Pac, MD   Chief Complaint: generalized weakness   HPI:  Pt is 69 yo female with known breast cancer and currently undergoing Abraxane chemotherapy, follows with Dr. Lindi Adie, with chronic nausea since starting chemo, recent diagnosis of C. Diff and completed treatment with flagyl, discharged on April 6th, 2016 after being hospitalized for evaluation of wide complex tachycardia, elevated troponins, treated with beta blocker, acute on chronic combined systolic and diastolic CHF (last 2 D ECHO EF 35%) presented to [facility] ED with main concern of generalized weakness after taking dose of Ativan at home. Please note that pt is rather sleepy on exam and vary slow to respond and therefore difficult to provide history. She denies chest pain or shortness of breath, no specific abd or urinary concerns.   In ED, pt noted to be hemodynamically stable, VSS, blood work notable for Cr 2.31 (up from recent value 4/10 Cr 1.13), Plt 68 (down from 129 on 08/23/14), severe transaminitis significantly above the last blood work on 4/10. CT abd with no clear acute finding to explain transaminitis.   Assessment and Plan: Active Problems:  Acute transaminitis - unclear etiology at this time, CT abd with specific acute finding to explain transaminitis  - GI team consulted by ED doctor, spoke with Cheyenne Riggs [facility] - will follow up on recommendations  Acute on chronic renal failure stage III - last known Cr 1/13 on last admission - now up and not very clear what the etiology is - given significant pleural effusions and weight on discharge was 76 kg 08/19/14, now up to 79 kg - will place on Lasix 20 mg IV BID and will monitor clinical response  - repeat BMP in AM - monitor daily weights, strict I/O Acute respiratory failure secondary to acute on chronic systolic and  diastolic CHF - last 2 D ECHO with EF 35% - pt reports not taking lasix regularly at home - her weight is up by 3 kg if the scale is correct - will place on lasix 20 mg IV BID as noted above and will monitor clinical response - daily weights, strict I/O Leukocytosis - mild, no clear signs of an infectious etiology, pt afebrile - will repeat CBC in AM Essential HTN - continue home medical regimen  Breast cancer - notify Dr. 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 Dependent pulmonary atelectasis.  Previous cholecystectomy and hysterectomy.  Left renal atrophy with nonobstructing renal stones in the lower pole.  Atherosclerosis of the aorta and its branch vessels.  Diverticulosis. Small amount of free fluid in the pelvis. Diverticulitis is not confidently demonstrated, though low level diverticulitis could be in apparent by imaging.   Dg Chest 2 View  08/26/2014  Resolving diffuse interstitial infiltrates. Stable cardiomegaly and persistent small layering bilateral pleural effusions.     Code Status: Full Family Communication: Pt at bedside Disposition Plan: Admit for further evaluation    Cheyenne Riggs [facility] [phone]   Review of Systems:  Constitutional: Negative for diaphoresis.  HENT: Negative for hearing loss, ear pain, nosebleeds,  congestion, sore throat, neck pain, tinnitus and ear discharge.   Eyes: Negative for blurred vision, double vision, photophobia, pain, discharge and redness.  Respiratory: Negative for cough, hemoptysis, sputum production, shortness of breath, wheezing and stridor.   Cardiovascular: Negative for chest pain, palpitations,  orthopnea.  Gastrointestinal: Negative for heartburn, constipation, blood in stool and melena.  Genitourinary: Negative for dysuria, urgency, frequency, hematuria and flank pain.  Musculoskeletal: Negative for myalgias, back pain, joint pain and falls.  Skin: Negative for itching and rash.  Neurological: Negative for dizziness Endo/Heme/Allergies: Negative for environmental allergies and polydipsia. Does not bruise/bleed easily.  Psychiatric/Behavioral: Negative for suicidal ideas. The patient is not nervous/anxious.      Past Medical History  Diagnosis Date  . Anxiety   . Back pain   . H/O bladder problems   . Cholelithiasis   . Depression   . High blood pressure   . Hypercholesteremia   . Kidney stones   . Gum disease   . Complication of anesthesia     bp goes up  . Wears glasses   . Wears dentures     top  . Kidney stones   . Cancer   . Breast cancer     Past Surgical History  Procedure Laterality Date  . Colon biopsy    . Mouth surgery    . Spine surgery    . Colonoscopy    . Tubal ligation    . Lithotripsy    . Rhinoplasty    . Back surgery  1989    lumb lam  . Breast biopsy Right 02/05/2014    invasive ductal cncer  . Abdominal hysterectomy  1982  . Cholecystectomy  2011    lap choli  . Radioactive seed guided mastectomy with axillary sentinel lymph node biopsy Right 02/27/2014    Procedure: RADIOACTIVE SEED GUIDED RIGHT PARTIAL MASTECTOMY WITH AXILLARY SENTINEL LYMPH NODE BIOPSY;  Surgeon: Stark Klein, MD;  Location: [place];  Service: General;  Laterality: Right;  . Portacath placement N/A 02/27/2014    Procedure: INSERTION PORT-A-CATH;  Surgeon: Stark Klein, MD;  Location: [place];  Service: General;  Laterality: N/A;    Social History:  reports that she quit smoking about 3 months ago. She does not have any smokeless tobacco history on file. She reports that she does not drink alcohol or use illicit  drugs.  Allergies  Allergen Reactions  . Ciprofloxacin Nausea And Vomiting  . Demerol [Meperidine] Hives  . Lidocaine     palpatations  . Other Swelling    Eye ointments  . Septra [Sulfamethoxazole-Trimethoprim] Nausea And Vomiting  . Codeine Rash  . Novocain [Procaine] Palpitations  . Vancomycin Rash    Family History  Problem Relation Age of Onset  . Stroke Mother     onset in her 91's  . Cancer - Other Father     eye cancer, black lung diseae  . Heart failure Sister   . Hypertension Brother     Prior to Admission medications   Medication Sig Start Date End Date Taking? Authorizing Provider  acetaminophen (TYLENOL) 325 MG tablet Take 650 mg by mouth every 6 (six) hours as needed for headache (headache).    Yes Historical Provider, MD  cholecalciferol (VITAMIN D) 1000 UNITS tablet Take 1,000 Units by mouth daily.    Yes Historical Provider, MD  diphenhydrAMINE (BENADRYL) 25 MG tablet Take 25 mg by mouth daily as needed for allergies (allergies).    Yes Historical Provider,  MD  FLUoxetine (PROZAC) 10 MG capsule Take 1 capsule (10 mg total) by mouth daily. 05/26/14  Yes Hosie Poisson, MD  furosemide (LASIX) 40 MG tablet Take 40 mg by mouth as needed for fluid (take with klor con for 5lb weight gain).  07/01/14  Yes Historical Provider, MD  lidocaine-prilocaine (EMLA) cream Apply 1 application topically once a week. On Chemo Day (Monday). 03/27/14  Yes Historical Provider, MD  loperamide (IMODIUM) 2 MG capsule Take 2 mg by mouth daily as needed for diarrhea or loose stools (diarrhea).    Yes Historical Provider, MD  LORazepam (ATIVAN) 0.5 MG tablet Take 1 tablet (0.5 mg total) by mouth every 8 (eight) hours as needed. Nausea 08/11/14  Yes Nicholas Lose, MD  metoprolol tartrate (LOPRESSOR) 25 MG tablet Take 1 tablet (25 mg total) by mouth 2 (two) times daily. 08/19/14  Yes Debbe Odea, MD  metroNIDAZOLE (FLAGYL) 500 MG tablet Take 1 tablet (500 mg total) by mouth 3 (three) times daily.  One po bid x 15 days 08/24/14  Yes Susanne Borders, NP  ondansetron (ZOFRAN ODT) 4 MG disintegrating tablet 4mg  ODT q4 hours prn nausea/vomit Patient taking differently: Take 4 mg by mouth every 4 (four) hours as needed for nausea or vomiting.  08/24/14  Yes Nicole Pisciotta, PA-C  ondansetron (ZOFRAN) 8 MG tablet Take 8 mg by mouth daily as needed for nausea or vomiting (nausea and vomiting).  03/30/14  Yes Historical Provider, MD  potassium chloride (MICRO-K) 10 MEQ CR capsule Take 2 capsules (20 mEq total) by mouth daily. 08/24/14  Yes Susanne Borders, NP  PRESCRIPTION MEDICATION Chemo [place]   Yes Historical Provider, MD  Probiotic Product (PROBIOTIC DAILY PO) Take 1 tablet by mouth daily.    Yes Historical Provider, MD    Physical Exam: Filed Vitals:   08/26/14 1028 08/26/14 1215 08/26/14 1428  BP: 133/71 125/76 135/58  Pulse: 66 64 62  Temp: 98.7 F (37.1 C)    TempSrc: Axillary    Resp: 16 18 20   SpO2: 99% 97% 100%    Physical Exam  Constitutional: Appears well-developed and well-nourished. No distress.  HENT: Normocephalic. External right and left ear normal. Oropharynx is clear and moist.  Eyes: Conjunctivae and EOM are normal. PERRLA, no scleral icterus.  Neck: Normal ROM. Neck supple. No JVD. No tracheal deviation. No thyromegaly.  CVS: RRR, S1/S2 +, no gallops, no carotid bruit.  Pulmonary: Bilateral crackles, no wheezing   Abdominal: Soft. BS +,  no distension, tenderness, rebound or guarding.  Musculoskeletal: Normal range of motion.  Lymphadenopathy: No lymphadenopathy noted, cervical, inguinal. Neuro: Alert. Normal reflexes, muscle tone coordination. No cranial nerve deficit. Skin: Skin is warm and dry. No rash noted. Not diaphoretic. No erythema. No pallor.  Psychiatric: Normal mood and affect. Behavior, judgment, thought content normal.   Labs on Admission:  Basic Metabolic Panel:  Recent Labs Lab 08/23/14 2038 08/24/14 1223 08/26/14 1213  NA 137 143 138  K  2.7* 3.4* 4.4  CL 99  --  103  CO2 26 24 21   GLUCOSE 116* 114 101*  BUN 9 8.9 16  CREATININE 1.13* 1.2* 2.31*  CALCIUM 8.9 8.9 9.4  MG 1.5  --   --    Liver Function Tests:  Recent Labs Lab 08/23/14 2038 08/26/14 1213  AST 23 1113*  ALT 8 215*  ALKPHOS 52 307*  BILITOT 0.8 3.2*  PROT 6.0 6.0  ALBUMIN 3.3* 3.3*    Recent Labs Lab 08/23/14 2038 08/26/14  1226  LIPASE 23 16   CBC:  Recent Labs Lab 08/23/14 2038 08/26/14 1213  WBC 4.8 10.6*  NEUTROABS 3.7 8.7*  HGB 11.8* 12.1  HCT 37.2 37.5  MCV 86.7 87.4  PLT 129* 68*    EKG: pending    If 7PM-7AM, please contact night-coverage [email or web address] Password [facility] 08/26/2014, 4:54 PM

## 2014-08-26 NOTE — ED Notes (Signed)
Cheyenne Riggs can be reached at [phone]

## 2014-08-26 NOTE — ED Provider Notes (Addendum)
CSN: [id number]     Arrival date & time 08/26/14  1022 History   First MD Initiated Contact with Patient 08/26/14 1048     Chief Complaint  Patient presents with  . Weakness  . Dizziness     The history is provided by the patient and medical records.   Patient has a history of breast cancer currently receiving chemotherapy.  She presents emergency department with increasing weakness as well as decreased oral intake and nausea.  She reports nausea when she attempts to eat.  She has already had a cholecystectomy.  She denies significant abdominal pain at this time.  She denies dysuria or urinary frequency.  No chest pain or shortness of breath.  Her symptoms are mild in severity.  Today she felt too weak to get out of bed.  She reports her nausea is been difficult to control her home.   Past Medical History  Diagnosis Date  . Anxiety   . Back pain   . H/O bladder problems   . Cholelithiasis   . Depression   . High blood pressure   . Hypercholesteremia   . Kidney stones   . Gum disease   . Complication of anesthesia     bp goes up  . Wears glasses   . Wears dentures     top  . Kidney stones   . Cancer   . Breast cancer    Past Surgical History  Procedure Laterality Date  . Colon biopsy    . Mouth surgery    . Spine surgery    . Colonoscopy    . Tubal ligation    . Lithotripsy    . Rhinoplasty    . Back surgery  1989    lumb lam  . Breast biopsy Right 02/05/2014    invasive ductal cncer  . Abdominal hysterectomy  1982  . Cholecystectomy  2011    lap choli  . Radioactive seed guided mastectomy with axillary sentinel lymph node biopsy Right 02/27/2014    Procedure: RADIOACTIVE SEED GUIDED RIGHT PARTIAL MASTECTOMY WITH AXILLARY SENTINEL LYMPH NODE BIOPSY;  Surgeon: Stark Klein, MD;  Location: [facility];  Service: General;  Laterality: Right;  . Portacath placement N/A 02/27/2014    Procedure: INSERTION PORT-A-CATH;  Surgeon: Stark Klein, MD;  Location:  [facility];  Service: General;  Laterality: N/A;   Family History  Problem Relation Age of Onset  . Stroke Mother     onset in her 12's  . Cancer - Other Father     eye cancer, black lung diseae  . Heart failure Sister   . Hypertension Brother    History  Substance Use Topics  . Smoking status: Former Smoker -- 0.00 packs/day    Quit date: 05/16/2014  . Smokeless tobacco: Not on file  . Alcohol Use: No   OB History    No data available     Review of Systems  All other systems reviewed and are negative.     Allergies  Ciprofloxacin; Demerol; Lidocaine; Other; Septra; Codeine; Novocain; and Vancomycin  Home Medications   Prior to Admission medications   Medication Sig Start Date End Date Taking? Authorizing Provider  acetaminophen (TYLENOL) 325 MG tablet Take 650 mg by mouth every 6 (six) hours as needed for headache (headache).    Yes Historical Provider, MD  cholecalciferol (VITAMIN D) 1000 UNITS tablet Take 1,000 Units by mouth daily.    Yes Historical Provider, MD  diphenhydrAMINE (BENADRYL) 25  MG tablet Take 25 mg by mouth daily as needed for allergies (allergies).    Yes Historical Provider, MD  FLUoxetine (PROZAC) 10 MG capsule Take 1 capsule (10 mg total) by mouth daily. 05/26/14  Yes Hosie Poisson, MD  furosemide (LASIX) 40 MG tablet Take 40 mg by mouth as needed for fluid (take with klor con for 5lb weight gain).  07/01/14  Yes Historical Provider, MD  lidocaine-prilocaine (EMLA) cream Apply 1 application topically once a week. On Chemo Day (Monday). 03/27/14  Yes Historical Provider, MD  loperamide (IMODIUM) 2 MG capsule Take 2 mg by mouth daily as needed for diarrhea or loose stools (diarrhea).    Yes Historical Provider, MD  LORazepam (ATIVAN) 0.5 MG tablet Take 1 tablet (0.5 mg total) by mouth every 8 (eight) hours as needed. Nausea 08/11/14  Yes Nicholas Lose, MD  metoprolol tartrate (LOPRESSOR) 25 MG tablet Take 1 tablet (25 mg total) by mouth 2  (two) times daily. 08/19/14  Yes Debbe Odea, MD  metroNIDAZOLE (FLAGYL) 500 MG tablet Take 1 tablet (500 mg total) by mouth 3 (three) times daily. One po bid x 15 days 08/24/14  Yes Susanne Borders, NP  ondansetron (ZOFRAN ODT) 4 MG disintegrating tablet 4mg  ODT q4 hours prn nausea/vomit Patient taking differently: Take 4 mg by mouth every 4 (four) hours as needed for nausea or vomiting.  08/24/14  Yes Nicole Pisciotta, PA-C  ondansetron (ZOFRAN) 8 MG tablet Take 8 mg by mouth daily as needed for nausea or vomiting (nausea and vomiting).  03/30/14  Yes Historical Provider, MD  potassium chloride (MICRO-K) 10 MEQ CR capsule Take 2 capsules (20 mEq total) by mouth daily. 08/24/14  Yes Susanne Borders, NP  PRESCRIPTION MEDICATION Chemo [place]   Yes Historical Provider, MD  Probiotic Product (PROBIOTIC DAILY PO) Take 1 tablet by mouth daily.    Yes Historical Provider, MD   BP 135/58 mmHg  Pulse 62  Temp(Src) 98.7 F (37.1 C) (Axillary)  Resp 20  SpO2 100% Physical Exam  Constitutional: She is oriented to person, place, and time. She appears well-developed and well-nourished. No distress.  HENT:  Head: Normocephalic and atraumatic.  Dry mucous membranes.  Eyes: EOM are normal.  Neck: Normal range of motion.  Cardiovascular: Normal rate, regular rhythm and normal heart sounds.   Pulmonary/Chest: Effort normal and breath sounds normal.  Abdominal: Soft. She exhibits no distension.  Mild upper abdominal tenderness without guarding or rebound.  Musculoskeletal: Normal range of motion.  Neurological: She is alert and oriented to person, place, and time.  Skin: Skin is warm and dry.  Psychiatric: She has a normal mood and affect. Judgment normal.  Nursing note and vitals reviewed.   ED Course  Procedures (including critical care time) Labs Review Labs Reviewed  CBC WITH DIFFERENTIAL/PLATELET - Abnormal; Notable for the following:    WBC 10.6 (*)    RDW 18.3 (*)    Platelets 68 (*)     Neutrophils Relative % 83 (*)    Neutro Abs 8.7 (*)    Lymphocytes Relative 9 (*)    All other components within normal limits  COMPREHENSIVE METABOLIC PANEL - Abnormal; Notable for the following:    Glucose, Bld 101 (*)    Creatinine, Ser 2.31 (*)    Albumin 3.3 (*)    AST 1113 (*)    ALT 215 (*)    Alkaline Phosphatase 307 (*)    Total Bilirubin 3.2 (*)    GFR calc non Af  Amer 21 (*)    GFR calc Af Amer 24 (*)    All other components within normal limits  LACTIC ACID, PLASMA - Abnormal; Notable for the following:    Lactic Acid, Venous 3.7 (*)    All other components within normal limits  LIPASE, BLOOD  URINALYSIS, ROUTINE W REFLEX MICROSCOPIC  HEPATITIS PANEL, ACUTE   ALT  Date Value Ref Range Status  08/26/2014 215* 0 - 35 U/L Final  08/23/2014 8 0 - 35 U/L Final  08/18/2014 8 0 - 35 U/L Final  08/17/2014 8 0 - 35 U/L Final  08/13/2014 6 0 - 55 U/L Final  08/03/2014 10 0 - 55 U/L Final  07/30/2014 11 0 - 55 U/L Final  07/27/2014 12 0 - 55 U/L Final    AST  Date Value Ref Range Status  08/26/2014 1113* 0 - 37 U/L Final  08/23/2014 23 0 - 37 U/L Final  08/18/2014 39* 0 - 37 U/L Final  08/17/2014 23 0 - 37 U/L Final  08/13/2014 18 5 - 34 U/L Final  08/03/2014 20 5 - 34 U/L Final  07/30/2014 18 5 - 34 U/L Final  07/27/2014 18 5 - 34 U/L Final    BUN  Date Value Ref Range Status  08/26/2014 16 6 - 23 mg/dL Final  08/24/2014 8.9 7.0 - 26.0 mg/dL Final  08/23/2014 9 6 - 23 mg/dL Final  08/18/2014 7 6 - 23 mg/dL Final  08/17/2014 6 6 - 23 mg/dL Final  08/13/2014 4.7* 7.0 - 26.0 mg/dL Final  08/03/2014 3.4* 7.0 - 26.0 mg/dL Final  07/30/2014 8.6 7.0 - 26.0 mg/dL Final   CREATININE  Date Value Ref Range Status  08/24/2014 1.2* 0.6 - 1.1 mg/dL Final  08/13/2014 1.2* 0.6 - 1.1 mg/dL Final  08/03/2014 1.0 0.6 - 1.1 mg/dL Final  07/30/2014 0.9 0.6 - 1.1 mg/dL Final   CREATININE, SER  Date Value Ref Range Status  08/26/2014 2.31* 0.50 - 1.10 mg/dL Final   08/23/2014 1.13* 0.50 - 1.10 mg/dL Final  08/18/2014 1.39* 0.50 - 1.10 mg/dL Final  08/17/2014 1.33* 0.50 - 1.10 mg/dL Final       Imaging Review Ct Abdomen Pelvis Wo Contrast  08/26/2014   CLINICAL DATA:  Weakness and dizziness.  EXAM: CT ABDOMEN AND PELVIS WITHOUT CONTRAST  TECHNIQUE: Multidetector CT imaging of the abdomen and pelvis was performed following the standard protocol without IV contrast.  COMPARISON:  09/22/2009  FINDINGS: There are large bilateral pleural effusions, right larger than left. These later dependently an or associated with dependent pulmonary atelectasis. The heart is enlarged. No significant pericardial fluid is seen.  The liver has a normal appearance without contrast. There has been previous cholecystectomy. The spleen is normal. The pancreas is normal. The adrenal glands show hypertrophic change. The right kidney is normal. The left kidney is atrophic and contains several stones in the lower pole. No sign of active obstruction. There is atherosclerosis of the aorta. The IVC is normal. No retroperitoneal mass or lymphadenopathy. No ascites. No bowel obstruction. Tiny amount of free fluid in the pelvis. There is diverticulosis of the sigmoid colon. Low level diverticulitis could not be excluded but there is no pronounced diverticulitis. There has been previous hysterectomy. There chronic degenerative changes of the spine.  IMPRESSION: Large pleural effusions, right larger than left. Dependent pulmonary atelectasis.  Previous cholecystectomy and hysterectomy.  Left renal atrophy with nonobstructing renal stones in the lower pole.  Atherosclerosis of the aorta and its branch  vessels.  Diverticulosis. Small amount of free fluid in the pelvis. Diverticulitis is not confidently demonstrated, though low level diverticulitis could be in apparent by imaging.   Electronically Signed   By: Nelson Chimes M.D.   On: 08/26/2014 16:17   Dg Chest 2 View  08/26/2014   CLINICAL DATA:   Weakness and dizziness. Currently undergoing chemotherapy for right upper outer quadrant breast carcinoma.  EXAM: CHEST  2 VIEW  COMPARISON:  08/17/2014  FINDINGS: Moderate cardiomegaly remains stable. Right-sided Port-A-Cath remains in appropriate position.  Decrease in diffuse interstitial infiltrates seen since previous study, consistent with decreased interstitial edema or pneumonitis. No evidence of pulmonary consolidation. Probable tiny layering bilateral pleural effusions noted on lateral projection.  IMPRESSION: Resolving diffuse interstitial infiltrates. Stable cardiomegaly and persistent small layering bilateral pleural effusions.   Electronically Signed   By: Earle Gell M.D.   On: 08/26/2014 13:42  I personally reviewed the imaging tests through PACS system I reviewed available ER/hospitalization records through the EMR    EKG Interpretation None      MDM   Final diagnoses:  None    Generally weak.  she appears to be in renal failure.  These are new elevated liver function tests as well.  She has been status post cholecystectomy.  CT scan obtained without significant abnormalities to explain elevated liver function tests.  Acute hepatitis panel ordered.    Jola Schmidt, MD 08/26/14 1625  4:40 PM Spoke with Edward Jolly, [facility]. They will see the pt in consultation during her hospital stay  Jola Schmidt, MD 08/26/14 1640

## 2014-08-26 NOTE — Progress Notes (Addendum)
Clinical Social Work Department CLINICAL SOCIAL WORK PLACEMENT NOTE 08/26/2014  Patient:  Cheyenne Riggs, Cheyenne Riggs  Account Number:  [id number] Admit date:  08/26/2014  Clinical Social Worker:  Loletta Specter  Date/time:  08/26/2014 03:00 PM  Clinical Social Work is seeking post-discharge placement for this patient at the following level of care:   [place]   (*CSW will update this form in Epic as items are completed)   08/26/2014  Patient/family provided with [place] Department of Clinical Social Work's list of facilities offering this level of care within the geographic area requested by the patient (or if unable, by the patient's family).  08/26/2014  Patient/family informed of their freedom to choose among providers that offer the needed level of care, that participate in Medicare, Medicaid or managed care program needed by the patient, have an available bed and are willing to accept the patient.  08/26/2014  Patient/family informed of [facility]' ownership interest in [facility], as well as of the fact that they are under no obligation to receive care at this facility.  PASARR submitted to EDS on 08/26/2014 PASARR number received on 08/26/2014 exsisting  FL2 transmitted to all facilities in geographic area requested by pt/family on  08/26/2014 and re-initated 08/31/2014 FL2 transmitted to all facilities within larger geographic area on   Patient informed that his/her managed care company has contracts with or will negotiate with  certain facilities, including the following:     Patient/family informed of bed offers received:   Patient chooses bed at  Physician recommends and patient chooses bed at    Patient to be transferred to  on   Patient to be transferred to facility by  Patient and family notified of transfer on  Name of family member notified:    The following physician request were entered in Epic:   Additional Comments: Belia Heman, [place] Work  [facility] [place], MSW, [place] Work [phone]

## 2014-08-26 NOTE — Telephone Encounter (Signed)
Patient in ED at present, Dr. Lindi Adie aware.

## 2014-08-26 NOTE — ED Notes (Signed)
Per Ulice Dash in the lab, Hepatitis panel is a send out, results will be 1 day turn around

## 2014-08-26 NOTE — Progress Notes (Signed)
CSW completed FL2 for pt.  Willette Brace [phone] ED CSW 08/26/2014 11:27 PM

## 2014-08-26 NOTE — ED Notes (Signed)
CrItical reported to EDP

## 2014-08-26 NOTE — ED Notes (Signed)
i attempted to stick patient and was unsuccessful.

## 2014-08-26 NOTE — ED Notes (Signed)
Will stop fluids once bolus is completed and will pull blood work off of port due to unsuccessful peripheral attempts x2.

## 2014-08-26 NOTE — ED Notes (Signed)
Pt in from home. C/o weakness and dizziness. Pt just d/c from ED earlier this am. Was supposed to go to [facility] for fluids today but son was unable to get pt in the car and wanted pt transported back here. Dizziness has resolved since being seen earlier but weakness has not.

## 2014-08-26 NOTE — Telephone Encounter (Signed)
Patient called reporting she "Needs help.  I am so weak I can't walk.  I'm scheduled to come in today for infusion but I need to come in earlier because I need help now."  States she is waiting for her son to arrive.  Is not dressed.  "I can't get to the bathroom.  I went to the ER yesterday.  When I got home I fell.  EMT came to get me back in the bed.  I did not have any injuries."  Denies being able to eat or drink due to nausea.  Denies emesis.  Consulted Production designer, theatre/television/film.  Patient needs to come in today to speak with social worker to determine needs.  Referral was made to [facility] but patient declined home health.   8:50 called patient urging her to come in as scheduled.  Asked if son could help her get dressed.  Encouraged to avoid ativan as ER note indicates she took doses too close together. Take zofran for nausea, FF even if she is just sucking on ice, and try to eat small meals to build up her strength. 8:55 Called infusion room.  Charge nurse can work patient in at 10:00 this morning.  Called patient instructing to come in at 10:00.

## 2014-08-26 NOTE — Progress Notes (Addendum)
ED CM consulted by Dr Venora Maples for this 69 yr old medicare pt seen this week on 08/23/14, 08/25/14 at [place] (total of 4 [facility] ED visits and 4 [facility] admissions in the last 6 months) CM reviewed EPIC to find pt was offered home health services from [facility] by refused home health services per Related encounter: Telephone from 08/26/2014 in [place] ED CM attempted to see pt but procedure is being complete Cm reviewed case with ED SW 1315 CM finished speaking with pt and son patrick at bedside. Called EDP to update him.  CM made pt and son aware of notes reviewed in EPIC r/t refusal of advanced home care and not a hospice candidate. Pt and son shared that prior to coming to [facility] ED they had a home visit from [facility] but came to ED instead. Cm discussed with them that Visiting Prudencio Pair is a Psychologist, educational (PDN) agency CM reviewed in details medicare guidelines, home health ([place]) (length of stay in home, types of [facility] staff available, coverage, primary caregiver, up to 24 hrs before services may be started), Private duty nursing (PDN-coverage, general cost per hour, length of stay in the home types of staff available), assisted living (ASL- coverage, services offered) and Skilled nursing facilities (snf- coverage and services offered)  CM reviewed availability of HH SW to assist pcp to get pt to snf (if desired disposition) from the community level. Discussed a skilled need has be found for facility placement Pt states she can not bathe, dress, feed, stand or walk.   Pt confirms she has [address] in her home providing her with HHPT only CM discuss the possible need to increase the care south services in the home to include a RN, OT, aide and SW but pt refuses because she states "they will not be able to stay with me around the clock and especially at night" After reviewing all options, pt insisting that she needs facility placement in snf vs alf (because she states  she can not do any ADLs) The pt informed Cm she has a "long term care policy" and would "need to call" the company to see what is available to her.  Reports she has been in "[facility]" before "but prefer not to go back" CM discussed the faciilty bed search process and discussed that [facility] may be the facility to be able to provide a bed because they are already familiar with her and her coverage. Cm discussed the out of pocket expense if no skilled need and qualifying medicare stay as possibly $2200. Pt an son states she can not afford Cm discussed that PDN services can stay with pt as she is requesting "around the clock and especially at night" and her long term coverage will assist  Son states "If she goes home she will be right back here. We have been here four times." Son states EDP was suppose to speak with oncologist.  Son stated if I need to I will go to the cancer center to have the staff there get the oncologist.  CM explained to the son that he could do so if he liked but the EDP is the attending MD while the pt is in the ED.  CM inquired if he and pt had been speaking with a cancer center SW at any point and time and the answer was "no" 1320 ED SW updated on pt long term care coverage and stating she wants snf facility  placement not home health services or ALF Discussed previously at [facility], Cm discussed cost out of pocket and pt not being able to afford per pt and encourage pt to check on her long term care coverage.

## 2014-08-26 NOTE — ED Notes (Signed)
Unsuccessful lab draw x2 RN made aware

## 2014-08-26 NOTE — Progress Notes (Addendum)
Clinical Social Work Department BRIEF PSYCHOSOCIAL ASSESSMENT 08/26/2014  Patient:  Hilgeman,Briceida P     Account Number:  [id number]     Admit date:  08/26/2014  Clinical Social Worker:  Reed,Kristen Stewart, LCSWA  Date/Time:  08/26/2014 02:24 PM  Referred by:  CSW  Date Referred:  08/26/2014 Referred for  SNF Placement   Other Referral:   Interview type:  Patient Other interview type:   and pt son Patrick    PSYCHOSOCIAL DATA Living Status:  ALONE Admitted from facility:   Level of care:   Primary support name:  Patrick Helfand Primary support relationship to patient:  CHILD, ADULT Degree of support available:   strong, at bedside, assisting with long term care policy    CURRENT CONCERNS Current Concerns  Post-Acute Placement   Other Concerns:    SOCIAL WORK ASSESSMENT / PLAN CSW met with pt at bedside to complete psychosocial assessment. Pt allowed pt son to be present during assessment and contribute to assessment. Pt and son shared that currently patient is living at home and patient is interested in skilled nursing placement. Patient states that she does not wish to recieve services in home and would prefer to be in a facility.    Pt shared she was at [facility] [place] before and felt very supportive by staff. Pt stated that she did find the beds uncomfortable though. CSW and pt discussed this barrier and concern. Patient is open to other facilities as well as [facility] and understand its hard to judge the bed comfort until patient is present in the facility.    pt hopes to be able to remain in the skilled nursing facility and use long term care plan if needed.   Assessment/plan status:  Psychosocial Support/Ongoing Assessment of Needs Other assessment/ plan:   Information/referral to community resources:   none identified at thtis time    PATIENT'S/FAMILY'S RESPONSE TO PLAN OF CARE: Patient and pt son thanked csw for concern and support. Pt plans to go to skilled  nursing once medicallys table. Pt feels that she can not continue to manage needs while also recieving cancer treatment. Patient hopeful for medicare coverage for short term rehab.     Kristen Reed, LCSW  Clinical Social Work  [facility] [phone]       

## 2014-08-26 NOTE — ED Notes (Signed)
Labs drawn from [place], pt to CT via stretcher. Family at bedside

## 2014-08-26 NOTE — Progress Notes (Signed)
[place] Work  Clinical Social Work was referred by Medical sales representative for assessment of psychosocial needs due to increased care needs.  Clinical Social Worker was to meet with patient at [facility] during infusion today to offer support and assess for needs. CSW made aware from RN that pt now in ED for evaluation. Per [facility] RN, orders for [facility] were placed and were to include RN, PT and SW. RN also stated that pt fell last night and could not transfer without assistance from wheelchair to chair/exam table. Pt most likely needs PT eval to assess need for rehab placement, which is a skilled need. Per chart review, PT has not seen Pt or completed PT eval on last three admissions in the last three months. [place] available to assist at future appointments.   Loren Racer, [place] Worker [place]  [place] Phone: [phone] Fax: [phone]

## 2014-08-27 LAB — CBC
HCT: 39.3 % (ref 36.0–46.0)
HEMOGLOBIN: 12.3 g/dL (ref 12.0–15.0)
MCH: 27.8 pg (ref 26.0–34.0)
MCHC: 31.3 g/dL (ref 30.0–36.0)
MCV: 88.7 fL (ref 78.0–100.0)
PLATELETS: 57 10*3/uL — AB (ref 150–400)
RBC: 4.43 MIL/uL (ref 3.87–5.11)
RDW: 18.6 % — ABNORMAL HIGH (ref 11.5–15.5)
WBC: 13.5 10*3/uL — AB (ref 4.0–10.5)

## 2014-08-27 LAB — COMPREHENSIVE METABOLIC PANEL
ALBUMIN: 3.2 g/dL — AB (ref 3.5–5.2)
ALK PHOS: 336 U/L — AB (ref 39–117)
ALT: 246 U/L — ABNORMAL HIGH (ref 0–35)
AST: 1213 U/L — AB (ref 0–37)
Anion gap: 11 (ref 5–15)
BILIRUBIN TOTAL: 3.7 mg/dL — AB (ref 0.3–1.2)
BUN: 21 mg/dL (ref 6–23)
CHLORIDE: 107 mmol/L (ref 96–112)
CO2: 19 mmol/L (ref 19–32)
CREATININE: 2.61 mg/dL — AB (ref 0.50–1.10)
Calcium: 8.8 mg/dL (ref 8.4–10.5)
GFR calc Af Amer: 21 mL/min — ABNORMAL LOW (ref >90)
GFR, EST NON AFRICAN AMERICAN: 18 mL/min — AB (ref >90)
Glucose, Bld: 69 mg/dL — ABNORMAL LOW (ref 70–99)
POTASSIUM: 4.8 mmol/L (ref 3.5–5.1)
Sodium: 137 mmol/L (ref 135–145)
Total Protein: 5.7 g/dL — ABNORMAL LOW (ref 6.0–8.3)

## 2014-08-27 LAB — ACETAMINOPHEN LEVEL

## 2014-08-27 LAB — CK
CK TOTAL: 2709 U/L — AB (ref 7–177)
Total CK: 2202 U/L — ABNORMAL HIGH (ref 7–177)

## 2014-08-27 MED ORDER — SIMETHICONE 80 MG PO CHEW
80.0000 mg | CHEWABLE_TABLET | Freq: Once | ORAL | Status: AC
  Administered 2014-08-27: 80 mg via ORAL

## 2014-08-27 MED ORDER — LOPERAMIDE HCL 2 MG PO CAPS
2.0000 mg | ORAL_CAPSULE | ORAL | Status: DC | PRN
  Administered 2014-08-27: 2 mg via ORAL
  Filled 2014-08-27: qty 1

## 2014-08-27 MED ORDER — LORAZEPAM 1 MG PO TABS
1.0000 mg | ORAL_TABLET | Freq: Four times a day (QID) | ORAL | Status: DC | PRN
  Administered 2014-08-27: 1 mg via ORAL
  Filled 2014-08-27: qty 1

## 2014-08-27 MED ORDER — TRAMADOL HCL 50 MG PO TABS
50.0000 mg | ORAL_TABLET | Freq: Four times a day (QID) | ORAL | Status: DC | PRN
Start: 2014-08-27 — End: 2014-08-28
  Filled 2014-08-27: qty 1

## 2014-08-27 MED ORDER — MORPHINE SULFATE 2 MG/ML IJ SOLN: 1.0000 mg | INTRAMUSCULAR | Status: DC | PRN

## 2014-08-27 MED ORDER — SODIUM CHLORIDE 0.9 % IV SOLN
INTRAVENOUS | Status: DC
  Administered 2014-08-27 – 2014-08-31 (×4): via INTRAVENOUS

## 2014-08-27 MED ORDER — CEFTRIAXONE SODIUM IN DEXTROSE 20 MG/ML IV SOLN
1.0000 g | INTRAVENOUS | Status: AC
  Administered 2014-08-27 – 2014-09-02 (×6): 1 g via INTRAVENOUS
  Filled 2014-08-27 (×7): qty 50

## 2014-08-27 NOTE — Progress Notes (Signed)
Pt refused tramadol for pain. Pt refused to be pulled up in the bed and repositioned.  Pt unable to stand to be assisted to [facility]. Offered bedpan and refused. MD notified and informed. Educated and refused SCDs.

## 2014-08-27 NOTE — Consult Note (Signed)
   [facility] Inpatient Consult   08/27/2014  Cheyenne Riggs [birth year] [id number]   [facility] Care Management evaluation for 5 admissions in past 6 months. Came to bedside to speak witih patient about services. However, she was resting at the time and nursing was at bedside. Asked that Probation officer come back later. Inpatient RNCM aware that [facility] attempting to engage patient for services.  Marthenia Rolling, MSN-Ed, RN,BSN [facility] Liaison [phone]

## 2014-08-27 NOTE — Progress Notes (Signed)
Patient ID: Cheyenne Riggs, female   DOB: 11/25/1945, 69 y.o.   MRN: [id number]  [facility] PROGRESS NOTE  SHALINI MAIR VZC:[id number] DOB: [birth year] DOA: 08/26/2014 PCP: Gennette Pac, MD   Brief narrative:    Pt is 69 yo female with known breast cancer and currently undergoing Abraxane chemotherapy, follows with Dr. Lindi Adie, with chronic nausea since starting chemo, recent diagnosis of C. Diff and completed treatment with flagyl, discharged on April 6th, 2016 after being hospitalized for evaluation of wide complex tachycardia, elevated troponins, treated with beta blocker, acute on chronic combined systolic and diastolic CHF (last 2 D ECHO EF 35%) presented to [facility] ED with main concern of generalized weakness after taking dose of Ativan at home. Please note that pt is rather sleepy on exam and vary slow to respond and therefore difficult to provide history. She denies chest pain or shortness of breath, no specific abd or urinary concerns.   In ED, pt noted to be hemodynamically stable, VSS, blood work notable for Cr 2.31 (up from recent value 4/10 Cr 1.13), Plt 68 (down from 129 on 08/23/14), severe transaminitis significantly above the last blood work on 4/10. CT abd with no clear acute finding to explain transaminitis.   Assessment/Plan:    Active Problems: Acute transaminitis secondary to drug induced hepatotoxicity, ? Ischemic hepatitis  - CT abd with specific acute finding to explain transaminitis  - per Dr. Watt Climes, elevated CPK, LDH, with elevated LTF's point to drug induced hepatotoxicity, pt was taking tylenol at home but denies taking more than usual 3-4 times per day - will check tylenol level now just in case - will also stop tylenol for pain and place on tramadol instead - will provide IVF and repeat CK and CMET in AM  Acute on chronic renal failure stage III - last known Cr 1.13 on last admission, appears to be now pre renal in etiology  - with elevated CK level  pointing in the direction of rhabdomyolysis, will stop Lasix and place on IVF for now - gentle hydration given systolic and diastolic CHF with EF 12%, pt is euvolemic on exam this AM - weight is stable at 79 kg over the past 24 hours - monitor daily weights, strict I/O - repeat BMP in AM  Rhabdomyolysis - place on IVF today and repeat CK level in AM - stop tylenol   Metabolic acidosis - CO2 18 on 08/26/14 - secondary to rhabdomyolysis and ischemic hepatitis  - place on IVF as noted above and repeat BMP in AM  Acute respiratory failure secondary to acute on chronic systolic and diastolic CHF - last 2 D ECHO with EF 35% - her weight is up by 3 kg if the scale is correct based on last admission  - currently euvolemic on exam and with rhabdomyolysis, will place on IVF gentle hydration to avoid vascular congestion  - monitor daily weights, I's and O's  Leukocytosis - UA suggestive of UTI - place on IV Rocephin and follow up on urine culture   Acute urinary retention - place foley for now  Essential HTN - hold Metoprolol due to bradycardia  Bradycardia - hold metoprolol and continue to monitor on telemetry   Breast cancer - notified Dr. Lindi Adie of pt's admission   Severe PCM - in the context of chronic illness - pt tolerating current diet well   Thrombocytopenia - will avoid heparin products - no signs of bleeding   DVT prophylaxis - SCD's   Code Status:  Full.  Family Communication:  plan of care discussed with the patient Disposition Plan: Home when stable.   IV access:  Peripheral IV  Procedures and diagnostic studies:    Ct Abdomen Pelvis Wo Contrast  08/26/2014   Large pleural effusions, right larger than left. Dependent pulmonary atelectasis.  Previous cholecystectomy and hysterectomy.  Left renal atrophy with nonobstructing renal stones in the lower pole.  Atherosclerosis of the aorta and its branch vessels.  Diverticulosis. Small amount of free fluid in the  pelvis. Diverticulitis is not confidently demonstrated, though low level diverticulitis could be in apparent by imaging.     Dg Chest 2 View  08/26/2014   Resolving diffuse interstitial infiltrates. Stable cardiomegaly and persistent small layering bilateral pleural effusions.     Medical Consultants:  GI  Other Consultants:  None   IAnti-Infectives:   Rocephin 4/14 -->  Faye Ramsay, MD  [facility] Pager [phone]  If 7PM-7AM, please contact night-coverage [email or web address] Password [email or web address] 08/27/2014, 3:31 PM   LOS: 1 day   HPI/Subjective: Pt with urinary urgency and urinary retention, dysuria.   Objective: Filed Vitals:   08/27/14 0543 08/27/14 1006 08/27/14 1213 08/27/14 1346  BP: 127/63 118/63 144/69 110/56  Pulse: 58 57 57 56  Temp: 97.4 F (36.3 C)  97.6 F (36.4 C) 98.1 F (36.7 C)  TempSrc: Oral  Axillary Axillary  Resp: 18   16  Height:      Weight: 79.788 kg (175 lb 14.4 oz)     SpO2: 100%  100% 98%    Intake/Output Summary (Last 24 hours) at 08/27/14 1531 Last data filed at 08/26/14 1700  Gross per 24 hour  Intake      0 ml  Output    100 ml  Net   -100 ml    Exam:   General:  Pt is alert, follows commands appropriately, not in acute distress  Cardiovascular: Regular rhythm, bradycardia, S1/S2, no rubs, no gallops  Respiratory: No wheezing, no crackles, no rhonchi  Abdomen: Soft, non tender, non distended, bowel sounds present, no guarding  Extremities: pulses DP and PT palpable bilaterally  Neuro: Grossly nonfocal  Data Reviewed: Basic Metabolic Panel:  Recent Labs Lab 08/23/14 2038 08/24/14 1223 08/26/14 1213 08/26/14 1945 08/27/14 0550  NA 137 143 138 138 137  K 2.7* 3.4* 4.4 4.5 4.8  CL 99  --  103 106 107  CO2 26 24 21  18* 19  GLUCOSE 116* 114 101* 85 69*  BUN 9 8.9 16 17 21   CREATININE 1.13* 1.2* 2.31* 2.32* 2.61*  CALCIUM 8.9 8.9 9.4 9.0 8.8  MG 1.5  --   --  1.8  --   PHOS  --   --   --  6.9*  --    Liver Function  Tests:  Recent Labs Lab 08/23/14 2038 08/26/14 1213 08/26/14 1945 08/27/14 0550  AST 23 1113* 1027* 1213*  ALT 8 215* 221* 246*  ALKPHOS 52 307* 303* 336*  BILITOT 0.8 3.2* 3.6* 3.7*  PROT 6.0 6.0 5.7* 5.7*  ALBUMIN 3.3* 3.3* 3.1* 3.2*    Recent Labs Lab 08/23/14 2038 08/26/14 1226  LIPASE 23 16   CBC:  Recent Labs Lab 08/23/14 2038 08/26/14 1213 08/27/14 0550  WBC 4.8 10.6* 13.5*  NEUTROABS 3.7 8.7*  --   HGB 11.8* 12.1 12.3  HCT 37.2 37.5 39.3  MCV 86.7 87.4 88.7  PLT 129* 68* 57*   Cardiac Enzymes:  Recent Labs Lab 08/26/14 1226 08/27/14 0550  CKTOTAL 2202*  2709*   Scheduled Meds: . cefTRIAXone (ROCEPHIN)  IV  1 g Intravenous Q24H  . feeding supplement (ENSURE ENLIVE)  237 mL Oral BID BM  . FLUoxetine  10 mg Oral Daily  . furosemide  20 mg Intravenous BID  . potassium chloride  20 mEq Oral Daily  . sodium chloride  3 mL Intravenous Q12H   Continuous Infusions:

## 2014-08-27 NOTE — Progress Notes (Signed)
Pt has not voided during the shift. Attempted several times on bed pan. Md notified. SRP, RN

## 2014-08-27 NOTE — Progress Notes (Signed)
Pt HR 36. Pt responsive.With stimulatation HR 57, BP 144/69, oxygen saturation 100% on RA. MD notified.  Will continue to monitor.

## 2014-08-27 NOTE — Progress Notes (Signed)
Asked by the other groups PA to assist with patient care based on having a [facility] primary doctor and in reviewing the hospital computer with a normal CT and no abdominal pain and normal liver tests a few days ago and with her atypical liver tests I asked them to run a CPK which was highly positive which goes along with rhabdomyolysis which is probably the cause of her lab abnormalities and with her acute hepatitis panel negative a drug-induced cause would be the most likely etiology and I would recommend stopping all unnecessary medicine and please call me if I can  be of any further assistance or if any other GI question or problem I could help with

## 2014-08-27 NOTE — Progress Notes (Signed)
INITIAL NUTRITION ASSESSMENT  DOCUMENTATION CODES Per approved criteria  -Obesity Unspecified   INTERVENTION: RD available to speak with patient if pt desires. Encourage PO intake  NUTRITION DIAGNOSIS: Inadequate oral intake related to poor appetite as evidenced by poor PO intake <25%.   Goal: Pt to meet >/= 90% of their estimated nutrition needs   Monitor:  PO and supplemental intake, weight, labs, I/O's  Reason for Assessment: Consult for nutritional assessment  Admitting Dx: generalized weakness   ASSESSMENT: 69 yo female with known breast cancer and currently undergoing Abraxane chemotherapy with chronic nausea since starting chemo, recent diagnosis of C. Diff and completed treatment with flagyl, discharged on April 6th, 2016 after being hospitalized for evaluation of wide complex tachycardia, elevated troponins, treated with beta blocker, acute on chronic combined systolic and diastolic CHF (last 2 D ECHO EF 35%) presented to [facility] ED with main concern of generalized weakness after taking dose of Ativan at home.  RD visited pt in room. RN present in room. Pt states that she "does not want a dietitian" at this time. Offered to come back at a later time if pt desired. Pt stated that she was going to transfer to "[facility]". RN unaware of any plans for pt to transfer.   RN states that pt has not been eating much today. Pt ate one orange on her breakfast tray.  Unable to perform nutrition focused physical exam. Noticed some depletion in facial areas.  Labs reviewed: Elevated Creatinine Low Glucose 69  Height: Ht Readings from Last 1 Encounters:  08/26/14 5\' 3"  (1.6 m)    Weight: Wt Readings from Last 1 Encounters:  08/27/14 175 lb 14.4 oz (79.788 kg)    Ideal Body Weight: 115 lb  % Ideal Body Weight: 152%  Wt Readings from Last 10 Encounters:  08/27/14 175 lb 14.4 oz (79.788 kg)  08/25/14 168 lb 4 oz (76.318 kg)  08/19/14 168 lb 4.8 oz (76.34 kg)  08/13/14  171 lb 14.4 oz (77.973 kg)  08/09/14 175 lb 1.6 oz (79.425 kg)  07/30/14 175 lb 1.6 oz (79.425 kg)  07/27/14 177 lb 9.6 oz (80.559 kg)  07/20/14 176 lb 11.2 oz (80.151 kg)  07/06/14 180 lb 3.2 oz (81.738 kg)  07/01/14 169 lb 4.8 oz (76.794 kg)    Usual Body Weight: 200 lb  % Usual Body Weight: 88%  BMI:  Body mass index is 31.17 kg/(m^2).  Estimated Nutritional Needs: Kcal: 1800-2000 Protein: 80-90g Fluid: 1.8L/day  Skin: intact  Diet Order: Diet regular Room service appropriate?: Yes; Fluid consistency:: Thin  EDUCATION NEEDS: -No education needs identified at this time   Intake/Output Summary (Last 24 hours) at 08/27/14 1012 Last data filed at 08/26/14 1700  Gross per 24 hour  Intake      0 ml  Output    100 ml  Net   -100 ml    Last BM: 4/13  Labs:   Recent Labs Lab 08/23/14 2038  08/26/14 1213 08/26/14 1945 08/27/14 0550  NA 137  < > 138 138 137  K 2.7*  < > 4.4 4.5 4.8  CL 99  --  103 106 107  CO2 26  < > 21 18* 19  BUN 9  < > 16 17 21   CREATININE 1.13*  < > 2.31* 2.32* 2.61*  CALCIUM 8.9  < > 9.4 9.0 8.8  MG 1.5  --   --  1.8  --   PHOS  --   --   --  6.9*  --   GLUCOSE 116*  < > 101* 85 69*  < > = values in this interval not displayed.  CBG (last 3)  No results for input(s): GLUCAP in the last 72 hours.  Scheduled Meds: . feeding supplement (ENSURE ENLIVE)  237 mL Oral BID BM  . FLUoxetine  10 mg Oral Daily  . furosemide  20 mg Intravenous BID  . metoprolol tartrate  25 mg Oral BID  . potassium chloride  20 mEq Oral Daily  . sodium chloride  3 mL Intravenous Q12H    Continuous Infusions:   Past Medical History  Diagnosis Date  . Anxiety   . Back pain   . H/O bladder problems   . Cholelithiasis   . Depression   . High blood pressure   . Hypercholesteremia   . Kidney stones   . Gum disease   . Complication of anesthesia     bp goes up  . Wears glasses   . Wears dentures     top  . Kidney stones   . Cancer   . Breast  cancer     Past Surgical History  Procedure Laterality Date  . Colon biopsy    . Mouth surgery    . Spine surgery    . Colonoscopy    . Tubal ligation    . Lithotripsy    . Rhinoplasty    . Back surgery  1989    lumb lam  . Breast biopsy Right 02/05/2014    invasive ductal cncer  . Abdominal hysterectomy  1982  . Cholecystectomy  2011    lap choli  . Radioactive seed guided mastectomy with axillary sentinel lymph node biopsy Right 02/27/2014    Procedure: RADIOACTIVE SEED GUIDED RIGHT PARTIAL MASTECTOMY WITH AXILLARY SENTINEL LYMPH NODE BIOPSY;  Surgeon: Stark Klein, MD;  Location: [place];  Service: General;  Laterality: Right;  . Portacath placement N/A 02/27/2014    Procedure: INSERTION PORT-A-CATH;  Surgeon: Stark Klein, MD;  Location: [place];  Service: General;  Laterality: N/A;    Clayton Bibles, MS, RD, LDN Pager: [phone] After Hours Pager: [phone]

## 2014-08-27 NOTE — Progress Notes (Signed)
ED CM left voice message for [facility] to inform of pt admission, to be followed while inpateint for d/c needs

## 2014-08-27 NOTE — Progress Notes (Signed)
Clinical Social Work  CSW met with pt at bedside to discuss DC plans. Pt and son agreeable to SNF at DC and may be even long term care after short term stay is up. CSW gave pt a list of SNF in [facility] and highlighted the SNF that made a bed offer for pt. Pt wants to discuss a little more with son before choosing a facility but is leaning towards [facility] for placement. CSW informed pt and son to contact CSW when they have confirmed a SNF. Pt and son agreeable to do that. CSW will continue to follow for further DC plans.  Glorious Peach BSW Intern

## 2014-08-28 ENCOUNTER — Other Ambulatory Visit: Payer: Medicare Other

## 2014-08-28 ENCOUNTER — Inpatient Hospital Stay (HOSPITAL_COMMUNITY): Payer: Medicare Other

## 2014-08-28 ENCOUNTER — Telehealth: Payer: Self-pay | Admitting: Hematology and Oncology

## 2014-08-28 ENCOUNTER — Encounter: Payer: Medicare Other | Admitting: Nurse Practitioner

## 2014-08-28 ENCOUNTER — Other Ambulatory Visit: Payer: Self-pay

## 2014-08-28 ENCOUNTER — Ambulatory Visit: Payer: Medicare Other

## 2014-08-28 DIAGNOSIS — R531 Weakness: Secondary | ICD-10-CM | POA: Diagnosis present

## 2014-08-28 DIAGNOSIS — J9600 Acute respiratory failure, unspecified whether with hypoxia or hypercapnia: Secondary | ICD-10-CM

## 2014-08-28 DIAGNOSIS — R7989 Other specified abnormal findings of blood chemistry: Secondary | ICD-10-CM | POA: Diagnosis present

## 2014-08-28 DIAGNOSIS — N179 Acute kidney failure, unspecified: Secondary | ICD-10-CM

## 2014-08-28 DIAGNOSIS — R945 Abnormal results of liver function studies: Secondary | ICD-10-CM

## 2014-08-28 LAB — COMPREHENSIVE METABOLIC PANEL
ALBUMIN: 2.8 g/dL — AB (ref 3.5–5.2)
ALT: 242 U/L — AB (ref 0–35)
ANION GAP: 10 (ref 5–15)
AST: 1153 U/L — AB (ref 0–37)
Alkaline Phosphatase: 299 U/L — ABNORMAL HIGH (ref 39–117)
BILIRUBIN TOTAL: 2.8 mg/dL — AB (ref 0.3–1.2)
BUN: 32 mg/dL — ABNORMAL HIGH (ref 6–23)
CALCIUM: 8.4 mg/dL (ref 8.4–10.5)
CO2: 20 mmol/L (ref 19–32)
CREATININE: 3.18 mg/dL — AB (ref 0.50–1.10)
Chloride: 106 mmol/L (ref 96–112)
GFR calc Af Amer: 16 mL/min — ABNORMAL LOW (ref >90)
GFR calc non Af Amer: 14 mL/min — ABNORMAL LOW (ref >90)
Glucose, Bld: 92 mg/dL (ref 70–99)
Potassium: 4.5 mmol/L (ref 3.5–5.1)
SODIUM: 136 mmol/L (ref 135–145)
TOTAL PROTEIN: 5.2 g/dL — AB (ref 6.0–8.3)

## 2014-08-28 LAB — BASIC METABOLIC PANEL
Anion gap: 15 (ref 5–15)
BUN: 36 mg/dL — AB (ref 6–23)
CALCIUM: 8.8 mg/dL (ref 8.4–10.5)
CO2: 15 mmol/L — ABNORMAL LOW (ref 19–32)
CREATININE: 3.05 mg/dL — AB (ref 0.50–1.10)
Chloride: 109 mmol/L (ref 96–112)
GFR, EST AFRICAN AMERICAN: 17 mL/min — AB (ref >90)
GFR, EST NON AFRICAN AMERICAN: 15 mL/min — AB (ref >90)
Glucose, Bld: 98 mg/dL (ref 70–99)
Potassium: 4.9 mmol/L (ref 3.5–5.1)
Sodium: 139 mmol/L (ref 135–145)

## 2014-08-28 LAB — CBC
HCT: 40.5 % (ref 36.0–46.0)
Hemoglobin: 12.7 g/dL (ref 12.0–15.0)
MCH: 27.9 pg (ref 26.0–34.0)
MCHC: 31.4 g/dL (ref 30.0–36.0)
MCV: 89 fL (ref 78.0–100.0)
Platelets: 45 10*3/uL — ABNORMAL LOW (ref 150–400)
RBC: 4.55 MIL/uL (ref 3.87–5.11)
RDW: 19.1 % — ABNORMAL HIGH (ref 11.5–15.5)
WBC: 12.2 10*3/uL — ABNORMAL HIGH (ref 4.0–10.5)

## 2014-08-28 LAB — BLOOD GAS, ARTERIAL
Acid-base deficit: 10.2 mmol/L — ABNORMAL HIGH (ref 0.0–2.0)
Bicarbonate: 12.9 mEq/L — ABNORMAL LOW (ref 20.0–24.0)
Drawn by: 276051
FIO2: 0.21 %
O2 Saturation: 93 %
PCO2 ART: 22.5 mmHg — AB (ref 35.0–45.0)
Patient temperature: 98.6
TCO2: 11.5 mmol/L (ref 0–100)
pH, Arterial: 7.378 (ref 7.350–7.450)
pO2, Arterial: 73.3 mmHg — ABNORMAL LOW (ref 80.0–100.0)

## 2014-08-28 LAB — CK TOTAL AND CKMB (NOT AT ARMC)
CK, MB: 49.6 ng/mL — AB (ref 0.3–4.0)
RELATIVE INDEX: 1.5 (ref 0.0–2.5)
Total CK: 3297 U/L — ABNORMAL HIGH (ref 7–177)

## 2014-08-28 LAB — HAPTOGLOBIN: Haptoglobin: 72 mg/dL (ref 34–200)

## 2014-08-28 LAB — MRSA PCR SCREENING: MRSA BY PCR: NEGATIVE

## 2014-08-28 LAB — LACTIC ACID, PLASMA: LACTIC ACID, VENOUS: 3.6 mmol/L — AB (ref 0.5–2.0)

## 2014-08-28 MED ORDER — SODIUM CHLORIDE 0.9 % IV BOLUS (SEPSIS)
750.0000 mL | Freq: Once | INTRAVENOUS | Status: AC
  Administered 2014-08-28: 750 mL via INTRAVENOUS
  Administered 2014-08-28: 18:00:00 via INTRAVENOUS

## 2014-08-28 MED ORDER — DEXTROSE 5 % IV SOLN
30.0000 ug/min | INTRAVENOUS | Status: DC
  Administered 2014-08-30: 30 ug/min via INTRAVENOUS
  Filled 2014-08-28: qty 1

## 2014-08-28 MED ORDER — AMIODARONE HCL IN DEXTROSE 360-4.14 MG/200ML-% IV SOLN
30.0000 mg/h | INTRAVENOUS | Status: DC
  Filled 2014-08-28: qty 200

## 2014-08-28 MED ORDER — METHOCARBAMOL 500 MG PO TABS: 500.0000 mg | ORAL_TABLET | Freq: Three times a day (TID) | ORAL | Status: DC | PRN

## 2014-08-28 MED ORDER — HEPARIN SODIUM (PORCINE) 5000 UNIT/ML IJ SOLN
5000.0000 [IU] | Freq: Three times a day (TID) | INTRAMUSCULAR | Status: DC
Start: 2014-08-28 — End: 2014-08-30
  Administered 2014-08-28 – 2014-08-30 (×5): 5000 [IU] via SUBCUTANEOUS
  Filled 2014-08-28 (×4): qty 1

## 2014-08-28 MED ORDER — SODIUM BICARBONATE 8.4 % IV SOLN
INTRAVENOUS | Status: DC
  Administered 2014-08-28 – 2014-08-31 (×5): via INTRAVENOUS
  Filled 2014-08-28 (×10): qty 150

## 2014-08-28 MED ORDER — METOPROLOL TARTRATE 1 MG/ML IV SOLN
5.0000 mg | INTRAVENOUS | Status: DC | PRN
  Administered 2014-08-28 – 2014-08-29 (×2): 5 mg via INTRAVENOUS
  Filled 2014-08-28: qty 5

## 2014-08-28 MED ORDER — AMIODARONE LOAD VIA INFUSION
150.0000 mg | Freq: Once | INTRAVENOUS | Status: AC
  Administered 2014-08-28: 150 mg via INTRAVENOUS
  Filled 2014-08-28: qty 83.34

## 2014-08-28 MED ORDER — AMIODARONE IV BOLUS ONLY 150 MG/100ML
INTRAVENOUS | Status: AC
  Filled 2014-08-28: qty 100

## 2014-08-28 MED ORDER — AMIODARONE HCL IN DEXTROSE 360-4.14 MG/200ML-% IV SOLN
60.0000 mg/h | INTRAVENOUS | Status: DC
  Administered 2014-08-28: 60 mg/h via INTRAVENOUS

## 2014-08-28 NOTE — Progress Notes (Signed)
  Amiodarone Drug - Drug Interaction Consult Note  Recommendations: No significant drug-drug intxn noted at this time.  Amiodarone is metabolized by the cytochrome P450 system and therefore has the potential to cause many drug interactions. Amiodarone has an average plasma half-life of 50 days (range 20 to 100 days).   There is potential for drug interactions to occur several weeks or months after stopping treatment and the onset of drug interactions may be slow after initiating amiodarone.   []  Statins: Increased risk of myopathy. Simvastatin- restrict dose to 20mg  daily. Other statins: counsel patients to report any muscle pain or weakness immediately.  []  Anticoagulants: Amiodarone can increase anticoagulant effect. Consider warfarin dose reduction. Patients should be monitored closely and the dose of anticoagulant altered accordingly, remembering that amiodarone levels take several weeks to stabilize.  []  Antiepileptics: Amiodarone can increase plasma concentration of phenytoin, the dose should be reduced. Note that small changes in phenytoin dose can result in large changes in levels. Monitor patient and counsel on signs of toxicity.  []  Beta blockers: increased risk of bradycardia, AV block and myocardial depression. Sotalol - avoid concomitant use.  []   Calcium channel blockers (diltiazem and verapamil): increased risk of bradycardia, AV block and myocardial depression.  []   Cyclosporine: Amiodarone increases levels of cyclosporine. Reduced dose of cyclosporine is recommended.  []  Digoxin dose should be halved when amiodarone is started.  []  Diuretics: increased risk of cardiotoxicity if hypokalemia occurs.  []  Oral hypoglycemic agents (glyburide, glipizide, glimepiride): increased risk of hypoglycemia. Patient's glucose levels should be monitored closely when initiating amiodarone therapy.   []  Drugs that prolong the QT interval:  Torsades de pointes risk may be increased with  concurrent use - avoid if possible.  Monitor QTc, also keep magnesium/potassium WNL if concurrent therapy can't be avoided. Marland Kitchen Antibiotics: e.g. fluoroquinolones, erythromycin. . Antiarrhythmics: e.g. quinidine, procainamide, disopyramide, sotalol. . Antipsychotics: e.g. phenothiazines, haloperidol.  . Lithium, tricyclic antidepressants, and methadone.  Thank You,  Lynelle Doctor  08/28/2014 4:14 PM

## 2014-08-28 NOTE — Telephone Encounter (Signed)
Patients son called to cancel todays appointments as she is in the hospital   Cheyenne Riggs

## 2014-08-28 NOTE — Progress Notes (Addendum)
Patient ID: Cheyenne Riggs, female   DOB: 1945/06/05, 69 y.o.   MRN: [id number]  [facility] PROGRESS NOTE  Cheyenne Riggs QZR:[id number] DOB: [birth year] DOA: 08/26/2014 PCP: Gennette Pac, MD   Brief narrative:    Pt is 69 yo female with known breast cancer and currently undergoing Abraxane chemotherapy, follows with Dr. Lindi Adie, with chronic nausea since starting chemo, recent diagnosis of C. Diff and completed treatment with flagyl, discharged on April 6th, 2016 after being hospitalized for evaluation of wide complex tachycardia, elevated troponins, treated with beta blocker, acute on chronic combined systolic and diastolic CHF (last 2 D ECHO EF 35%) presented to [facility] ED with main concern of generalized weakness after taking dose of Ativan at home. Please note that pt is rather sleepy on exam and vary slow to respond and therefore difficult to provide history. She denies chest pain or shortness of breath, no specific abd or urinary concerns.   In ED, pt noted to be hemodynamically stable, VSS, blood work notable for Cr 2.31 (up from recent value 4/10 Cr 1.13), Plt 68 (down from 129 on 08/23/14), severe transaminitis significantly above the last blood work on 4/10. CT abd with no clear acute finding to explain transaminitis.   Assessment/Plan:    Active Problems: Acute transaminitis secondary to drug induced hepatotoxicity, ? Ischemic hepatitis  - CT abd with specific acute finding to explain transaminitis  - per Dr. Watt Climes, elevated CPK, LDH, with elevated LTF's point to drug induced hepatotoxicity - Please note that patient initially denied taking Tylenol more than prescribed, upon further questioning this morning, patient admits to taking double to triple doses of tylenol for back pain and headaches, says she completed one bottle in 1 week but is not sure how many tablets she took total - Tylenol has been discontinued - Patient was educated on effects of Tylenol on liver toxicity,  denies any suicidal intent - Liver tests still elevated but slowly improving - Tylenol level undetectable - Plan on repeating liver function tests in the morning  Acute on chronic renal failure stage III - last known Cr 1.13 on last admission, creatinine continues trending up - with elevated CK level pointing in the direction of rhabdomyolysis, progression from prerenal etiology to ATN, ? CIN (pt has received contrast for recent CT abd) - Systolic CHF with EF 35% precludes use of aggressive IV fluid hydration - We'll monitor volume status closely to avoid pulmonary vascular congestion, continue normal saline but increase rate to 100 mL an hour - Ask for renal ultrasound - monitor daily weights, strict I/O - repeat BMP in AM - will ask pharmacy to investigate ? Chemo renal toxicity   Metabolic acidosis - in the setting of acute on chronic renal failure and rhabdomyolysis  - improving - repeat BMP in AM  Rhabdomyolysis - CK level trending up, we'll increase IV fluid rate from 75 to 100 mL an hour  - repeat CK level in AM  Acute respiratory failure secondary to acute on chronic systolic and diastolic CHF - last 2 D ECHO with EF 35% - weight on admission 79 kg, now trending down to 75 kg - lasix still on hold, continuing IVF as noted above with close monitoring of volume status to avoid pulmonary vascular congestion  - monitor daily weights, I's and O's  Leukocytosis secondary to UTI - continue Rocephin day #2 - follow up on urine culture   Acute urinary retention - placed foley for now - renal US requested  Essential HTN - reasonable inpatient control   Bradycardia - Metoprolol held and HR now stable in 60's  Breast cancer - notified Dr. Lindi Adie of pt's admission  - pt wants to stop chemotherapy, to think about PCT   Severe PCM - in the context of chronic illness - pt tolerating current diet well   Thrombocytopenia - will avoid heparin products - ? Chemo side  effect  - no signs of bleeding   DVT prophylaxis - SCD's   Code Status: Full.  Family Communication:  plan of care discussed with the patient Disposition Plan: Home when stable.   IV access:  Peripheral IV  Procedures and diagnostic studies:    Ct Abdomen Pelvis Wo Contrast  08/26/2014   Large pleural effusions, right larger than left. Dependent pulmonary atelectasis.  Previous cholecystectomy and hysterectomy.  Left renal atrophy with nonobstructing renal stones in the lower pole.  Atherosclerosis of the aorta and its branch vessels.  Diverticulosis. Small amount of free fluid in the pelvis. Diverticulitis is not confidently demonstrated, though low level diverticulitis could be in apparent by imaging.     Dg Chest 2 View  08/26/2014   Resolving diffuse interstitial infiltrates. Stable cardiomegaly and persistent small layering bilateral pleural effusions.     Medical Consultants:  GI  Other Consultants:  None   IAnti-Infectives:   Rocephin 4/14 -->  Faye Ramsay, MD  [facility] Pager [phone]  If 7PM-7AM, please contact night-coverage [email or web address] Password [facility] 08/28/2014, 1:49 PM   LOS: 2 days   HPI/Subjective: Pt with back pain and stomach ache this AM.   Objective: Filed Vitals:   08/27/14 1213 08/27/14 1346 08/27/14 2200 08/28/14 0706  BP: 144/69 110/56 110/64 129/60  Pulse: 57 56 64 65  Temp: 97.6 F (36.4 C) 98.1 F (36.7 C) 97.5 F (36.4 C) 97.9 F (36.6 C)  TempSrc: Axillary Axillary Oral Axillary  Resp:  16 16 18   Height:      Weight:    74.9 kg (165 lb 2 oz)  SpO2: 100% 98% 97% 94%    Intake/Output Summary (Last 24 hours) at 08/28/14 1349 Last data filed at 08/28/14 0600  Gross per 24 hour  Intake    855 ml  Output      0 ml  Net    855 ml    Exam:   General:  Pt is alert, follows commands appropriately, not in acute distress  Cardiovascular: Regular rhythm, bradycardia, S1/S2, no rubs, no gallops  Respiratory: No wheezing, no  crackles, no rhonchi  Abdomen: Soft, tender in epigastric area, non distended, bowel sounds present, no guarding  Extremities: pulses DP and PT palpable bilaterally  Neuro: Grossly nonfocal  Data Reviewed: Basic Metabolic Panel:  Recent Labs Lab 08/23/14 2038 08/24/14 1223 08/26/14 1213 08/26/14 1945 08/27/14 0550 08/28/14 0450  NA 137 143 138 138 137 136  K 2.7* 3.4* 4.4 4.5 4.8 4.5  CL 99  --  103 106 107 106  CO2 26 24 21  18* 19 20  GLUCOSE 116* 114 101* 85 69* 92  BUN 9 8.9 16 17 21  32*  CREATININE 1.13* 1.2* 2.31* 2.32* 2.61* 3.18*  CALCIUM 8.9 8.9 9.4 9.0 8.8 8.4  MG 1.5  --   --  1.8  --   --   PHOS  --   --   --  6.9*  --   --    Liver Function Tests:  Recent Labs Lab 08/23/14 2038 08/26/14 1213 08/26/14 1945 08/27/14 0550 08/28/14 0450  AST 23 1113* 1027* 1213* 1153*  ALT 8 215* 221* 246* 242*  ALKPHOS 52 307* 303* 336* 299*  BILITOT 0.8 3.2* 3.6* 3.7* 2.8*  PROT 6.0 6.0 5.7* 5.7* 5.2*  ALBUMIN 3.3* 3.3* 3.1* 3.2* 2.8*    Recent Labs Lab 08/23/14 2038 08/26/14 1226  LIPASE 23 16   CBC:  Recent Labs Lab 08/23/14 2038 08/26/14 1213 08/27/14 0550 08/28/14 0450  WBC 4.8 10.6* 13.5* 12.2*  NEUTROABS 3.7 8.7*  --   --   HGB 11.8* 12.1 12.3 12.7  HCT 37.2 37.5 39.3 40.5  MCV 86.7 87.4 88.7 89.0  PLT 129* 68* 57* 45*   Cardiac Enzymes:  Recent Labs Lab 08/26/14 1226 08/27/14 0550 08/28/14 0450  CKTOTAL 2202* 2709* 3297*  CKMB  --   --  49.6*   Scheduled Meds: . cefTRIAXone (ROCEPHIN)  IV  1 g Intravenous Q24H  . feeding supplement (ENSURE ENLIVE)  237 mL Oral BID BM  . potassium chloride  20 mEq Oral Daily  . sodium chloride  3 mL Intravenous Q12H   Continuous Infusions: . sodium chloride 75 mL/hr at 08/28/14 0945

## 2014-08-28 NOTE — Procedures (Signed)
Central Venous Catheter Insertion Procedure Note Cheyenne Riggs [id number] 1945/12/22  Procedure: Insertion of Central Venous Catheter Indications: Assessment of intravascular volume, Drug and/or fluid administration and Frequent blood sampling  Procedure Details Consent: Risks of procedure as well as the alternatives and risks of each were explained to the (patient/caregiver).  Consent for procedure obtained. Time Out: Verified patient identification, verified procedure, site/side was marked, verified correct patient position, special equipment/implants available, medications/allergies/relevent history reviewed, required imaging and test results available.  Performed Real time [place] used to ID and cannulate the vessel  Maximum sterile technique was used including antiseptics, cap, gloves, gown, hand hygiene, mask and sheet. Skin prep: Chlorhexidine; local anesthetic administered A antimicrobial bonded/coated triple lumen catheter was placed in the left internal jugular vein using the Seldinger technique.  Evaluation Blood flow good Complications: No apparent complications Patient did tolerate procedure well. Chest X-ray ordered to verify placement.  CXR: pending.  BABCOCK,PETE 08/28/2014, 5:14 PM  Baltazar Apo, MD, PhD 08/28/2014, 5:15 PM [name] [phone] or if no answer [phone]

## 2014-08-28 NOTE — Consult Note (Signed)
PULMONARY / CRITICAL CARE MEDICINE   Name: Cheyenne Riggs MRN: [id number] DOB: November 09, 1945    ADMISSION DATE:  08/26/2014 CONSULTATION DATE:  4/15  REFERRING MD :  Doyle Askew   CHIEF COMPLAINT:  Hypotension, metabolic acidosis and tachycardia   INITIAL PRESENTATION:  69 yo female with known breast cancer and currently undergoing Abraxane chemotherapy, follows with Dr. Lindi Adie, last was 3 weeks PTA. Just admitted for C. Diff and completed treatment with flagyl, course c/b af w/ RVR discharged on April 6th, 2016. Re-admitted w/ CC: weakness on 4/13. Dx eval demonstrated: new transaminitis felt to be drug-induced hepatitis. Initially she denied any meds, specifically APAP. She later admitted to taking about 1 bottle of tylenol a week. Her initial APAP level >24hrs after admit was <10. Her total CKs were elevated raising concern for Rhabdo, and since admit she has had progressive renal failure. To date treatment has revolved around avoiding hepatotoxic medication, IV hydration and supportive care. As of 4/15 her LFTs were starting to level out/trend down slightly, but her total CKs and Creatinine was continuing to climb. During the afternoon of 4/15 she developed new onset narrow complex SVT w/ associated hypotension. This did not respond to BB. PCCM asked to assist w/ hemodynamic support.   STUDIES:  4/5: echo: diffuse hypokinesis. EF 35%  SIGNIFICANT EVENTS: 4/13: admitted w/ increased LFTs; c/w acute drug toxicity.  4/14: admits to large volumes of Apap for pain ~ 1 bottle/week. Apap level <10. Total CKs however elevated. Creatine climbing all felt to be r/t rhabdo from hepatotoxicity.  4/15: . Renal fxn worse. LFTs a little better. developed acute SVT w/ associated hypotension. PCCM asked to assess   HISTORY OF PRESENT ILLNESS:   See above   PAST MEDICAL HISTORY :   has a past medical history of Anxiety; Back pain; H/O bladder problems; Cholelithiasis; Depression; High blood pressure;  Hypercholesteremia; Kidney stones; Gum disease; Complication of anesthesia; Wears glasses; Wears dentures; Kidney stones; Cancer; and Breast cancer.  has past surgical history that includes Colon biopsy; Mouth surgery; Spine surgery; Colonoscopy; Tubal ligation; Lithotripsy; Rhinoplasty; Back surgery (1989); Breast biopsy (Right, 02/05/2014); Abdominal hysterectomy (1982); Cholecystectomy (2011); Radioactive seed guided mastectomy with axillary sentinel lymph node biopsy (Right, 02/27/2014); and Portacath placement (N/A, 02/27/2014). Prior to Admission medications   Medication Sig Start Date End Date Taking? Authorizing Provider  acetaminophen (TYLENOL) 325 MG tablet Take 650 mg by mouth every 6 (six) hours as needed for headache (headache).    Yes Historical Provider, MD  cholecalciferol (VITAMIN D) 1000 UNITS tablet Take 1,000 Units by mouth daily.    Yes Historical Provider, MD  diphenhydrAMINE (BENADRYL) 25 MG tablet Take 25 mg by mouth daily as needed for allergies (allergies).    Yes Historical Provider, MD  FLUoxetine (PROZAC) 10 MG capsule Take 1 capsule (10 mg total) by mouth daily. 05/26/14  Yes Hosie Poisson, MD  furosemide (LASIX) 40 MG tablet Take 40 mg by mouth as needed for fluid (take with klor con for 5lb weight gain).  07/01/14  Yes Historical Provider, MD  lidocaine-prilocaine (EMLA) cream Apply 1 application topically once a week. On Chemo Day (Monday). 03/27/14  Yes Historical Provider, MD  loperamide (IMODIUM) 2 MG capsule Take 2 mg by mouth daily as needed for diarrhea or loose stools (diarrhea).    Yes Historical Provider, MD  LORazepam (ATIVAN) 0.5 MG tablet Take 1 tablet (0.5 mg total) by mouth every 8 (eight) hours as needed. Nausea 08/11/14  Yes Nicholas Lose, MD  metoprolol tartrate (LOPRESSOR) 25 MG tablet Take 1 tablet (25 mg total) by mouth 2 (two) times daily. 08/19/14  Yes Debbe Odea, MD  metroNIDAZOLE (FLAGYL) 500 MG tablet Take 1 tablet (500 mg total) by mouth 3 (three)  times daily. One po bid x 15 days 08/24/14  Yes Susanne Borders, NP  ondansetron (ZOFRAN ODT) 4 MG disintegrating tablet 4mg  ODT q4 hours prn nausea/vomit Patient taking differently: Take 4 mg by mouth every 4 (four) hours as needed for nausea or vomiting.  08/24/14  Yes Nicole Pisciotta, PA-C  ondansetron (ZOFRAN) 8 MG tablet Take 8 mg by mouth daily as needed for nausea or vomiting (nausea and vomiting).  03/30/14  Yes Historical Provider, MD  potassium chloride (MICRO-K) 10 MEQ CR capsule Take 2 capsules (20 mEq total) by mouth daily. 08/24/14  Yes Susanne Borders, NP  PRESCRIPTION MEDICATION Chemo [place]   Yes Historical Provider, MD  Probiotic Product (PROBIOTIC DAILY PO) Take 1 tablet by mouth daily.    Yes Historical Provider, MD   Allergies  Allergen Reactions  . Ciprofloxacin Nausea And Vomiting  . Demerol [Meperidine] Hives  . Lidocaine     palpatations  . Other Swelling    Eye ointments  . Septra [Sulfamethoxazole-Trimethoprim] Nausea And Vomiting  . Codeine Rash  . Novocain [Procaine] Palpitations  . Vancomycin Rash    FAMILY HISTORY:  indicated that her mother is deceased. She indicated that her father is deceased.  SOCIAL HISTORY:  reports that she quit smoking about 3 months ago. She does not have any smokeless tobacco history on file. She reports that she does not drink alcohol or use illicit drugs.  REVIEW OF SYSTEMS:  Unable  SUBJECTIVE:  Anxious   VITAL SIGNS: Temp:  [97.5 F (36.4 C)-97.9 F (36.6 C)] 97.5 F (36.4 C) (04/15 1405) Pulse Rate:  [64-160] 153 (04/15 1459) Resp:  [16-20] 20 (04/15 1405) BP: (87-129)/(54-74) 94/54 mmHg (04/15 1459) SpO2:  [94 %-98 %] 98 % (04/15 1405) Weight:  [74.9 kg (165 lb 2 oz)] 74.9 kg (165 lb 2 oz) (04/15 0706) HEMODYNAMICS:   VENTILATOR SETTINGS:   INTAKE / OUTPUT:  Intake/Output Summary (Last 24 hours) at 08/28/14 1529 Last data filed at 08/28/14 1437  Gross per 24 hour  Intake   1455 ml  Output      0 ml  Net    1455 ml    PHYSICAL EXAMINATION: General:  Acute on chronically ill appearing white female, currently anxious but not in acute distress.  Neuro:  Awake, confused. No focal def  HEENT:  MM dry, neck veins flat  Cardiovascular:  Tachy regular SVT on tele Lungs:  Decreased bases  Abdomen:  Soft, no OM. + bowel sounds  Musculoskeletal:  Intact  Skin:  Cool, thready pulses, some mottling of toes.   LABS:  CBC  Recent Labs Lab 08/26/14 1213 08/27/14 0550 08/28/14 0450  WBC 10.6* 13.5* 12.2*  HGB 12.1 12.3 12.7  HCT 37.5 39.3 40.5  PLT 68* 57* 45*   Coag's No results for input(s): APTT, INR in the last 168 hours. BMET  Recent Labs Lab 08/26/14 1945 08/27/14 0550 08/28/14 0450  NA 138 137 136  K 4.5 4.8 4.5  CL 106 107 106  CO2 18* 19 20  BUN 17 21 32*  CREATININE 2.32* 2.61* 3.18*  GLUCOSE 85 69* 92   Electrolytes  Recent Labs Lab 08/23/14 2038  08/26/14 1945 08/27/14 0550 08/28/14 0450  CALCIUM 8.9  < > 9.0  8.8 8.4  MG 1.5  --  1.8  --   --   PHOS  --   --  6.9*  --   --   < > = values in this interval not displayed. Sepsis Markers  Recent Labs Lab 08/26/14 1213  LATICACIDVEN 3.7*   ABG No results for input(s): PHART, PCO2ART, PO2ART in the last 168 hours. Liver Enzymes  Recent Labs Lab 08/26/14 1945 08/27/14 0550 08/28/14 0450  AST 1027* 1213* 1153*  ALT 221* 246* 242*  ALKPHOS 303* 336* 299*  BILITOT 3.6* 3.7* 2.8*  ALBUMIN 3.1* 3.2* 2.8*   Cardiac Enzymes No results for input(s): TROPONINI, PROBNP in the last 168 hours. Glucose No results for input(s): GLUCAP in the last 168 hours.  Imaging No results found.   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  Recheck LFTs tonight and in am as amio can have liver toxicity.  Favor early transition to BB if/when BP can tolerate Cont tele May need neo to support BP Given non-compliance not sure about appropriateness of anticoagulation at this point  RENAL A:   Acute renal failure Rhabdomyolysis  ? Complication of acute drug induced hepatitis or shock. S Cr climbing  P:   Cont hydration efforts Add bicarb gtt 4/15 Check urine myoglobin    GASTROINTESTINAL A:   Acute drug induced hepatitis. Not sure if this is a complication of her chemo, or possibly APAP. Consider also shock liver. Her LFTs are improving.  P:   Avoid hepatotoxic drugs as able  Try to get off amio asap F/u LFTs tonight and in afternoon  Consider portal vein thrombosis > check US doppler abdomen.  NPO-->sips and chips ok  HEMATOLOGIC A:   Breast cancer. S/p last chemo 3/14 w/ Abraxane. She has missed scheduled chemo and says she's not going to take it anymore. P:  [place] heparin, may need to consider gtt.  Trend CBC Heme/onc aware   INFECTIOUS A:  Trend fever curve  P:   UC 4/15>>>  ENDOCRINE A:  No acute  P:   Trend glucose   NEUROLOGIC A:   Pain  Intermittent acute encephalopathy  P:   RASS goal: 0 Hold all sedation  Supportive care    FAMILY  - Updates:   Suspect drug induced hepatoxicity (? Chemo or apap) outside of window for apap. LFTs improving some. Now w/ rhabdo prob from acute liver insult. Renal fxn worse. Suspect also volume depleted w/ element of rate related cardiogenic shock. Now in SVT which is reason she is in ICU. Will bolus IVF, start amio and cont supportive care.   Erick Colace ACNP-BC [place] Pager # [phone] OR # [phone] if no answer 08/28/2014, 3:29 PM   Attending Note:  I have examined patient, reviewed labs, studies and notes. I have discussed the case with Jerrye Bushy, and I agree with the data and plans as amended above. Pt has a hx Breast CA on  chemo, last 3 weeks ago. She has been ill, just discharged 4/6 for dehydration, ARF and C diff colitis. She was readmitted with weakness, noted to have acute renal failure, acute hepatitis, hypotension. Liver injury was ascribed to  either meds vs shock liver. No n-AC was given because she only admitted to tylenol overuse a few days later. She has received fluids and conservative management. She went into SVT 4/15 and decompensated. On my eval she is weak, confused, uncomfortable. Her mucous membranes are dry. HR is in 150's with a narrow complex. We will give amiodarone (noting the potential liver effects), place a CVC to gauge her volume status. Follow renal labs and LFT. Start Na bicarb gtt. Will also check abdominal US for completeness to r/o portal vein clot. Independent critical care time is 60 minutes.   Baltazar Apo, MD, PhD 08/28/2014, 5:04 PM [facility] [phone] or if no answer [phone]

## 2014-08-28 NOTE — Progress Notes (Signed)
EKG shows SVT with a rate of 155.  Bp 96/54.  Dr Doyle Askew notified, orders received.

## 2014-08-28 NOTE — Progress Notes (Signed)
Pt transferred to step down unit.  Report given to Ronalee Belts, [place].

## 2014-08-28 NOTE — Progress Notes (Signed)
HR showed SVT with rate of 160.  BP 93/72, pt without any complaints at present.  Dr. Doyle Askew notified, orders received.  Will continue to monitor.

## 2014-08-28 NOTE — Progress Notes (Signed)
CRITICAL VALUE ALERT  Critical value received:  CKMB 49.6  Date of notification:  08/28/14  Time of notification:  0900  Critical value read back:Yes.    Nurse who received alert:  Watt Climes  MD notified (1st page):  Doyle Askew  Time of first page:  0900  MD notified (2nd page):  Time of second page:  Responding MD: Doyle Askew  Time MD responded:  [phone]

## 2014-08-29 ENCOUNTER — Inpatient Hospital Stay (HOSPITAL_COMMUNITY): Payer: Medicare Other

## 2014-08-29 DIAGNOSIS — R7989 Other specified abnormal findings of blood chemistry: Secondary | ICD-10-CM

## 2014-08-29 DIAGNOSIS — R740 Nonspecific elevation of levels of transaminase and lactic acid dehydrogenase [LDH]: Secondary | ICD-10-CM

## 2014-08-29 DIAGNOSIS — N19 Unspecified kidney failure: Secondary | ICD-10-CM

## 2014-08-29 DIAGNOSIS — R531 Weakness: Secondary | ICD-10-CM

## 2014-08-29 LAB — COMPREHENSIVE METABOLIC PANEL
ALK PHOS: 275 U/L — AB (ref 39–117)
ALT: 179 U/L — AB (ref 0–35)
AST: 529 U/L — ABNORMAL HIGH (ref 0–37)
Albumin: 2.7 g/dL — ABNORMAL LOW (ref 3.5–5.2)
Anion gap: 10 (ref 5–15)
BUN: 39 mg/dL — ABNORMAL HIGH (ref 6–23)
CHLORIDE: 105 mmol/L (ref 96–112)
CO2: 21 mmol/L (ref 19–32)
Calcium: 8.5 mg/dL (ref 8.4–10.5)
Creatinine, Ser: 2.8 mg/dL — ABNORMAL HIGH (ref 0.50–1.10)
GFR calc Af Amer: 19 mL/min — ABNORMAL LOW (ref >90)
GFR calc non Af Amer: 16 mL/min — ABNORMAL LOW (ref >90)
Glucose, Bld: 164 mg/dL — ABNORMAL HIGH (ref 70–99)
Potassium: 4.4 mmol/L (ref 3.5–5.1)
SODIUM: 136 mmol/L (ref 135–145)
TOTAL PROTEIN: 4.9 g/dL — AB (ref 6.0–8.3)
Total Bilirubin: 2.8 mg/dL — ABNORMAL HIGH (ref 0.3–1.2)

## 2014-08-29 LAB — RENAL FUNCTION PANEL
ALBUMIN: 2.7 g/dL — AB (ref 3.5–5.2)
Anion gap: 12 (ref 5–15)
BUN: 41 mg/dL — ABNORMAL HIGH (ref 6–23)
CHLORIDE: 105 mmol/L (ref 96–112)
CO2: 20 mmol/L (ref 19–32)
Calcium: 8.6 mg/dL (ref 8.4–10.5)
Creatinine, Ser: 2.87 mg/dL — ABNORMAL HIGH (ref 0.50–1.10)
GFR calc non Af Amer: 16 mL/min — ABNORMAL LOW (ref >90)
GFR, EST AFRICAN AMERICAN: 18 mL/min — AB (ref >90)
GLUCOSE: 163 mg/dL — AB (ref 70–99)
Phosphorus: 5 mg/dL — ABNORMAL HIGH (ref 2.3–4.6)
Potassium: 4.3 mmol/L (ref 3.5–5.1)
Sodium: 137 mmol/L (ref 135–145)

## 2014-08-29 LAB — ACETAMINOPHEN LEVEL: Acetaminophen (Tylenol), Serum: <10 ug/mL — ABNORMAL LOW (ref 10–30)

## 2014-08-29 LAB — LACTIC ACID, PLASMA: Lactic Acid, Venous: 2.6 mmol/L (ref 0.5–2.0)

## 2014-08-29 LAB — CK: Total CK: 2364 U/L — ABNORMAL HIGH (ref 7–177)

## 2014-08-29 LAB — CLOSTRIDIUM DIFFICILE BY PCR: CDIFFPCR: NEGATIVE

## 2014-08-29 MED ORDER — CHLORHEXIDINE GLUCONATE 0.12 % MT SOLN
15.0000 mL | Freq: Two times a day (BID) | OROMUCOSAL | Status: DC
  Administered 2014-08-29 – 2014-09-04 (×11): 15 mL via OROMUCOSAL
  Filled 2014-08-29 (×12): qty 15

## 2014-08-29 MED ORDER — DILTIAZEM HCL 25 MG/5ML IV SOLN
10.0000 mg | Freq: Once | INTRAVENOUS | Status: AC
  Administered 2014-08-29: 10 mg via INTRAVENOUS
  Filled 2014-08-29: qty 5

## 2014-08-29 MED ORDER — CETYLPYRIDINIUM CHLORIDE 0.05 % MT LIQD
7.0000 mL | Freq: Two times a day (BID) | OROMUCOSAL | Status: DC
  Administered 2014-08-29 – 2014-09-02 (×8): 7 mL via OROMUCOSAL

## 2014-08-29 MED ORDER — HYDROMORPHONE HCL 1 MG/ML IJ SOLN
0.5000 mg | Freq: Once | INTRAMUSCULAR | Status: AC
Start: 2014-08-29 — End: 2014-08-29
  Administered 2014-08-29: 0.5 mg via INTRAVENOUS
  Filled 2014-08-29: qty 1

## 2014-08-29 MED ORDER — MORPHINE SULFATE 2 MG/ML IJ SOLN
1.0000 mg | Freq: Three times a day (TID) | INTRAMUSCULAR | Status: DC | PRN
  Administered 2014-08-29 – 2014-09-01 (×7): 1 mg via INTRAVENOUS
  Filled 2014-08-29 (×8): qty 1

## 2014-08-29 MED ORDER — METOPROLOL TARTRATE 25 MG PO TABS
25.0000 mg | ORAL_TABLET | Freq: Two times a day (BID) | ORAL | Status: DC
  Administered 2014-08-29 – 2014-09-04 (×13): 25 mg via ORAL
  Filled 2014-08-29 (×13): qty 1

## 2014-08-29 MED ORDER — KETOROLAC TROMETHAMINE 30 MG/ML IJ SOLN: 30.0000 mg | Freq: Four times a day (QID) | INTRAMUSCULAR | Status: DC | PRN

## 2014-08-29 MED ORDER — MORPHINE SULFATE 2 MG/ML IJ SOLN
1.0000 mg | Freq: Once | INTRAMUSCULAR | Status: AC
  Administered 2014-08-29: 1 mg via INTRAVENOUS
  Filled 2014-08-29: qty 1

## 2014-08-29 NOTE — Progress Notes (Signed)
85 RN entered patients room and noted SVT on the monitor with a rate of 158. 5mg  of PRN metoprolol was given IV. Will continue to monitor HR and rhythm. Luther Parody, RN

## 2014-08-29 NOTE — Progress Notes (Signed)
[facility] Progress Note Patient Name: Cheyenne Riggs DOB: 1945/08/14 MRN: [id number]   Date of Service  08/29/2014  HPI/Events of Note  HR of 155 read as ST but may be AFlutter.  Did not respond to lopressor 5 mg IV.  HD stable with BP of 105/74  eICU Interventions  Plan:  Will try diltiazem 10 mg IV times one May need to consider restart of amio gtt     Intervention Category Intermediate Interventions: Arrhythmia - evaluation and management  Orine Goga 08/29/2014, 4:27 AM

## 2014-08-29 NOTE — Progress Notes (Signed)
Patient is alert and oriented x4, but has abrupt periods of confusion. During these times her thought processes wander and she does not make sense. When asked orientation questions during these periods, she is able to answer correctly. These periods are markedly worse as the evening progresses. Luther Parody, RN

## 2014-08-29 NOTE — Progress Notes (Signed)
CRITICAL VALUE ALERT  Critical value received:  Lactate 2.6  Date of notification: 08/29/2014   Time of notification: 6:26 AM   Critical value read back:Yes.    Nurse who received alert:  c Teasha Murrillo  MD notified (1st page):  n/a Time of first page:  N/a  Value decreased from previous result  MD notified (2nd page):  Time of second page:  Responding MD:n/a Time MD responded: n/a

## 2014-08-29 NOTE — Progress Notes (Signed)
Patient ID: Cheyenne Riggs, female   DOB: 04-10-1946, 69 y.o.   MRN: [id number]  [facility] PROGRESS NOTE  Cheyenne Riggs WUJ:[id number] DOB: 1946-04-13 DOA: 08/26/2014 PCP: Gennette Pac, MD   Brief narrative:    Pt is 69 yo female with known breast cancer and currently undergoing Abraxane chemotherapy, follows with Dr. Lindi Adie, with chronic nausea since starting chemo, recent diagnosis of C. Diff and completed treatment with flagyl, discharged on April 6th, 2016 after being hospitalized for evaluation of wide complex tachycardia, elevated troponins, treated with beta blocker, acute on chronic combined systolic and diastolic CHF (last 2 D ECHO EF 35%) [facility] with main concern of generalized weakness after taking dose of Ativan at home. Please note that pt is rather sleepy on exam and vary slow to respond and therefore difficult to provide history. She denies chest pain or shortness of breath, no specific abd or urinary concerns.   In ED, pt noted to be hemodynamically stable, VSS, blood work notable for Cr 2.31 (up from recent value 4/10 Cr 1.13), Plt 68 (down from 129 on 08/23/14), severe transaminitis significantly above the last blood work on 4/10. CT abd with no clear acute finding to explain transaminitis.   Major events since admission: 4/15 - sudden hypotension with SVT requiring transfer to stepdown unit, central line placed, required pressors 4/16 - blood pressure stable, off pressors, LFTs and renal function improving  Assessment/Plan:    Active Problems: Acute transaminitis secondary to drug induced hepatotoxicity, ? Ischemic hepatitis  - CT abd with specific acute finding to explain transaminitis  - per Dr. Watt Climes, elevated CPK, LDH, with elevated LTF's point to drug induced hepatotoxicity - Please note that patient initially denied taking Tylenol more than prescribed - upon further questioning 4/15, patient admitted to taking double to triple doses of tylenol  for back pain and headaches - Liver tests still elevated but improving - Tylenol level undetectable - Plan on repeating liver function tests in the morning  Acute on chronic renal failure stage III - last known Cr 1.13 on last admission, creatinine continues trending up - with elevated CK level likely rhabdomyolysis, progression from prerenal etiology to ATN, ? CIN (pt has received contrast for recent CT abd) - Systolic CHF with EF 95% precludes use of aggressive IV fluid hydration - Creatinine slowly trending down - Renal ultrasound 08/28/2014 with atrophic left kidney, unremarkable right kidney, no signs of hydronephrosis - Continue to monitor intake and output - Repeat BMP in the morning  SVT with hypotension, 08/28/2014 - Unclear at triggering event, required transfer to stepdown unit - Stable this morning, off pressors - Blood pressure at target range - Still on amiodarone drip, will try to transition to off given no and hepatotoxicity - Placed on metoprolol per home medical regimen 25 mg by mouth twice a day  Metabolic acidosis - in the setting of acute on chronic renal failure and rhabdomyolysis and progression to ATN - improving with IV fluids, continue on bicarb  - repeat BMP in AM  Rhabdomyolysis - Patient responding well to IV fluids, CK level trending down - repeat CK level in AM  Acute on chronic respiratory failure secondary to acute on chronic systolic and diastolic CHF - last 2 D ECHO with EF 35%, known and persistent right side pleural effusion  - weight on admission 79 kg, now up this morning to 82 kg, could be due to different calibration scales from telemetry bed to SDU scale  -  lasix still on hold, continuing IVF as noted above with close monitoring of volume status to avoid pulmonary vascular congestion especially given known large right-sided pleural effusion - monitor daily weights, I's and O's - no ACEI in the setting or renal failure   Leukocytosis  secondary to UTI - continue Rocephin day #3 - follow up on urine culture   Acute urinary retention - placed foley  - renal US with no signs of hydronephrosis  Essential HTN - stable this AM off pressors   Bradycardia - Heart rate stable this morning - resumed metoprolol per home medical regimen   Breast cancer - notified Dr. Lindi Adie of pt's admission  - pt wants to stop chemotherapy, to think about PCT   Severe PCM - in the context of chronic illness - pt tolerating current diet well   Thrombocytopenia - ? Chemo side effect  - no signs of bleeding  - CBC in AM  DVT prophylaxis - Heparin SQ  Code Status: Full.  Family Communication:  plan of care discussed with the patient Disposition Plan: Keep in SDU today and possible transfer to telemetry bed in AM  IV access:  Peripheral IV, IJ CVC  Procedures and diagnostic studies:    Ct Abdomen Pelvis Wo Contrast  08/26/2014   Large pleural effusions, right larger than left. Dependent pulmonary atelectasis.  Previous cholecystectomy and hysterectomy.  Left renal atrophy with nonobstructing renal stones in the lower pole.  Atherosclerosis of the aorta and its branch vessels.  Diverticulosis. Small amount of free fluid in the pelvis. Diverticulitis is not confidently demonstrated, though low level diverticulitis could be in apparent by imaging.     Dg Chest 2 View  08/26/2014   Resolving diffuse interstitial infiltrates. Stable cardiomegaly and persistent small layering bilateral pleural effusions.     US Renal  08/28/2014  Comparatively atrophic left kidney with lower pole calculi. Appearance is commensurate with recent CT. There is no mass or hydronephrosis on either side. No perinephric fluid collections are appreciable. Right kidney appears normal by ultrasound.     [place] Art/ven Flow Abd Pelv Doppler  08/29/2014  Main portal vein is patent with normal hepatopetal flow. Intrahepatic portal veins are difficult image secondary to a  combination of hepatic steatosis and patient body habitus. Given the unremarkable appearance of the main portal vein, significant intrahepatic portal venous thrombosis is considered somewhat unlikely. 2. Hepatic steatosis. 3. Hepatic veins remain patent. 4. Right larger than left pleural effusions.     Dg Chest Port 1 View  08/28/2014   Tip of the left internal jugular central venous catheter in the SVC. No pneumothorax.     Medical Consultants:  PCCM  Other Consultants:  None   IAnti-Infectives:   Rocephin 4/14 -->  Faye Ramsay, MD  [facility] Pager [phone]  If 7PM-7AM, please contact night-coverage [email or web address] Password [facility] 08/29/2014, 2:24 PM   LOS: 3 days   HPI/Subjective: Pt with back pain and headache   Objective: Filed Vitals:   08/29/14 0900 08/29/14 1000 08/29/14 1100 08/29/14 1200  BP:  141/69  148/71  Pulse: 64   65  Temp:    97.5 F (36.4 C)  TempSrc:    Oral  Resp: 12 22 18 12   Height:      Weight:      SpO2: 97%   98%    Intake/Output Summary (Last 24 hours) at 08/29/14 1424 Last data filed at 08/29/14 1200  Gross per 24 hour  Intake 4852.06 ml  Output  0 ml  Net 4852.06 ml    Exam:   General:  Pt is alert, follows commands appropriately, not in acute distress  Cardiovascular: Regular rhythm, bradycardia, S1/S2, no rubs, no gallops  Respiratory: No wheezing, some crackles on the right side   Abdomen: Soft, non tender, non distended, bowel sounds present, no guarding  Extremities: pulses DP and PT palpable bilaterally  Neuro: Grossly nonfocal  Data Reviewed: Basic Metabolic Panel:  Recent Labs Lab 08/23/14 2038  08/26/14 1945 08/27/14 0550 08/28/14 0450 08/28/14 1557 08/29/14 0450  NA 137  < > 138 137 136 139 136  137  K 2.7*  < > 4.5 4.8 4.5 4.9 4.4  4.3  CL 99  < > 106 107 106 109 105  105  CO2 26  < > 18* 19 20 15* 21  20  GLUCOSE 116*  < > 85 69* 92 98 164*  163*  BUN 9  < > 17 21 32* 36* 39*  41*   CREATININE 1.13*  < > 2.32* 2.61* 3.18* 3.05* 2.80*  2.87*  CALCIUM 8.9  < > 9.0 8.8 8.4 8.8 8.5  8.6  MG 1.5  --  1.8  --   --   --   --   PHOS  --   --  6.9*  --   --   --  5.0*  < > = values in this interval not displayed. Liver Function Tests:  Recent Labs Lab 08/26/14 1213 08/26/14 1945 08/27/14 0550 08/28/14 0450 08/29/14 0450  AST 1113* 1027* 1213* 1153* 529*  ALT 215* 221* 246* 242* 179*  ALKPHOS 307* 303* 336* 299* 275*  BILITOT 3.2* 3.6* 3.7* 2.8* 2.8*  PROT 6.0 5.7* 5.7* 5.2* 4.9*  ALBUMIN 3.3* 3.1* 3.2* 2.8* 2.7*  2.7*    Recent Labs Lab 08/23/14 2038 08/26/14 1226  LIPASE 23 16   CBC:  Recent Labs Lab 08/23/14 2038 08/26/14 1213 08/27/14 0550 08/28/14 0450  WBC 4.8 10.6* 13.5* 12.2*  NEUTROABS 3.7 8.7*  --   --   HGB 11.8* 12.1 12.3 12.7  HCT 37.2 37.5 39.3 40.5  MCV 86.7 87.4 88.7 89.0  PLT 129* 68* 57* 45*   Cardiac Enzymes:  Recent Labs Lab 08/26/14 1226 08/27/14 0550 08/28/14 0450 08/29/14 0450  CKTOTAL 2202* 2709* 3297* 2364*  CKMB  --   --  49.6*  --    Scheduled Meds: . antiseptic oral rinse  7 mL Mouth Rinse q12n4p  . cefTRIAXone (ROCEPHIN)  IV  1 g Intravenous Q24H  . chlorhexidine  15 mL Mouth Rinse BID  . heparin subcutaneous  5,000 Units Subcutaneous 3 times per day  . sodium chloride  3 mL Intravenous Q12H   Continuous Infusions: . sodium chloride Stopped (08/28/14 1730)  . phenylephrine (NEO-SYNEPHRINE) Adult infusion    .  sodium bicarbonate  infusion 1000 mL 75 mL/hr at 08/29/14 0807

## 2014-08-29 NOTE — Progress Notes (Signed)
PULMONARY / CRITICAL CARE MEDICINE   Name: Cheyenne Riggs MRN: [id number] DOB: 02/16/1946    ADMISSION DATE:  08/26/2014 CONSULTATION DATE:  4/15  REFERRING MD :  Doyle Askew   CHIEF COMPLAINT:  Hypotension, metabolic acidosis and tachycardia   INITIAL PRESENTATION:  69 yo female with known breast cancer and currently undergoing Abraxane chemotherapy, follows with Dr. Lindi Adie, last was 3 weeks PTA. Just admitted for C. Diff and completed treatment with flagyl, course c/b af w/ RVR discharged on April 6th, 2016. Re-admitted w/ CC: weakness on 4/13. Dx eval demonstrated: new transaminitis felt to be drug-induced hepatitis. Initially she denied any meds, specifically APAP. She later admitted to taking about 1 bottle of tylenol a week. Her initial APAP level >24hrs after admit was <10. Her total CKs were elevated raising concern for Rhabdo, and since admit she has had progressive renal failure. To date treatment has revolved around avoiding hepatotoxic medication, IV hydration and supportive care. As of 4/15 her LFTs were starting to level out/trend down slightly, but her total CKs and Creatinine was continuing to climb. During the afternoon of 4/15 she developed new onset narrow complex SVT w/ associated hypotension. This did not respond to BB. PCCM asked to assist w/ hemodynamic support.   STUDIES:  4/5: echo: diffuse hypokinesis. EF 35%  SIGNIFICANT EVENTS: 4/13: admitted w/ increased LFTs; c/w acute drug toxicity.  4/14: admits to large volumes of Apap for pain ~ 1 bottle/week. Apap level <10. Total CKs however elevated. Creatine climbing all felt to be r/t rhabdo from hepatotoxicity.  4/15: . Renal fxn worse. LFTs a little better. developed acute SVT w/ associated hypotension. PCCM asked to assess 4/16 Renal function improving  SUBJECTIVE:  Confused at time but comfortable otherwise.  VITAL SIGNS: Temp:  [97.5 F (36.4 C)-98.2 F (36.8 C)] 98 F (36.7 C) (04/16 0800) Pulse Rate:  [41-160] 65  (04/16 1200) Resp:  [11-32] 12 (04/16 1200) BP: (82-148)/(51-80) 148/71 mmHg (04/16 1200) SpO2:  [91 %-100 %] 98 % (04/16 1200) Weight:  [82.6 kg (182 lb 1.6 oz)] 82.6 kg (182 lb 1.6 oz) (04/16 0500) HEMODYNAMICS: CVP:  [11 mmHg-16 mmHg] 16 mmHg VENTILATOR SETTINGS:   INTAKE / OUTPUT:  Intake/Output Summary (Last 24 hours) at 08/29/14 1211 Last data filed at 08/29/14 1000  Gross per 24 hour  Intake 4702.06 ml  Output      0 ml  Net 4702.06 ml    PHYSICAL EXAMINATION: General:  Acute on chronically ill appearing white female, currently anxious but not in acute distress.  Neuro:  Awake, confused at times but lucid right now. No focal def  Head: [place]illi[place]/AT EENT: PERRL, EOM-I and MMM Cardiovascular:  RRR, Nl S1/S2, -M/R/G. Lungs:  Decreased bases  Abdomen:  Soft, NT, ND and +BS Musculoskeletal:  Intact  Skin:  Cool, dry and intact  LABS:  CBC  Recent Labs Lab 08/26/14 1213 08/27/14 0550 08/28/14 0450  WBC 10.6* 13.5* 12.2*  HGB 12.1 12.3 12.7  HCT 37.5 39.3 40.5  PLT 68* 57* 45*   Coag's No results for input(s): APTT, INR in the last 168 hours. BMET  Recent Labs Lab 08/28/14 0450 08/28/14 1557 08/29/14 0450  NA 136 139 136  137  K 4.5 4.9 4.4  4.3  CL 106 109 105  105  CO2 20 15* 21  20  BUN 32* 36* 39*  41*  CREATININE 3.18* 3.05* 2.80*  2.87*  GLUCOSE 92 98 164*  163*   Electrolytes  Recent Labs Lab  08/23/14 2038  08/26/14 1945  08/28/14 0450 08/28/14 1557 08/29/14 0450  CALCIUM 8.9  < > 9.0  < > 8.4 8.8 8.5  8.6  MG 1.5  --  1.8  --   --   --   --   PHOS  --   --  6.9*  --   --   --  5.0*  < > = values in this interval not displayed. Sepsis Markers  Recent Labs Lab 08/26/14 1213 08/28/14 1557 08/29/14 0450  LATICACIDVEN 3.7* 3.6* 2.6*   ABG  Recent Labs Lab 08/28/14 1645  PHART 7.378  PCO2ART 22.5*  PO2ART 73.3*   Liver Enzymes  Recent Labs Lab 08/27/14 0550 08/28/14 0450 08/29/14 0450  AST 1213* 1153* 529*  ALT  246* 242* 179*  ALKPHOS 336* 299* 275*  BILITOT 3.7* 2.8* 2.8*  ALBUMIN 3.2* 2.8* 2.7*  2.7*   Cardiac Enzymes No results for input(s): TROPONINI, PROBNP in the last 168 hours. Glucose No results for input(s): GLUCAP in the last 168 hours.  Imaging US Renal  08/28/2014   CLINICAL DATA:  Acute renal failure.  EXAM: RENAL/URINARY TRACT ULTRASOUND COMPLETE  COMPARISON:  CT abdomen and pelvis August 26, 2014  FINDINGS: Right Kidney:  Length: 10.6 cm. Echogenicity and renal cortical thickness are within normal limits. No mass, perinephric fluid, or hydronephrosis visualized. No sonographically demonstrable calculus or ureterectasis.  Left Kidney:  Length: 7.8 cm. The left kidney is atrophic with areas of scarring. There are several lower pole region calculi, largest measuring 9 mm. There is no demonstrable mass, hydronephrosis, or perinephric fluid. No ureterectasis.  Bladder:  Appears normal for degree of bladder distention.  IMPRESSION: Comparatively atrophic left kidney with lower pole calculi. Appearance is commensurate with recent CT. There is no mass or hydronephrosis on either side. No perinephric fluid collections are appreciable. Right kidney appears normal by ultrasound.   Electronically Signed   By: Lowella Grip III M.D.   On: 08/28/2014 16:19   Dg Chest Port 1 View  08/28/2014   CLINICAL DATA:  Central line placement.  EXAM: PORTABLE CHEST - 1 VIEW  COMPARISON:  08/26/2014  FINDINGS: New left internal jugular central venous catheter tip in the proximal SVC. There is no pneumothorax. The right chest port remains in place, tip in the SVC. Cardiomegaly is unchanged. Hazy opacity at both lung bases, left greater than right, likely effusions and not significantly changed allowing for differences in technique. Stable prominence of central pulmonary vasculature. No new airspace disease.  IMPRESSION: Tip of the left internal jugular central venous catheter in the SVC. No pneumothorax.    Electronically Signed   By: Jeb Levering M.D.   On: 08/28/2014 18:17   ASSESSMENT / PLAN:  PULMONARY OETT A: Right effusion  At risk for respiratory failure  P:   Titrate FIO2 Cont pulse ox   CARDIOVASCULAR CVC 4/15 >>  Port-a-cath >>  A:  SVT Systolic Cardiomyopathy w/ EF 35% Cardiogenic shock +/- hypovolemic shock P:  Transduce CVP, volume for goal 10-12 IVF resuscitation Amio bolus and gtt.  LFT improving. Transition to beta blockers now. Cont tele D/C neo Continue bicarb drip Given non-compliance not sure about appropriateness of anticoagulation at this point  RENAL A:   Acute renal failure Rhabdomyolysis  ? Complication of acute drug induced hepatitis or shock. S Cr climbing  P:   Cont hydration efforts Continue bicarb gtt 4/15 Check urine myoglobin   GASTROINTESTINAL A:   Acute drug induced hepatitis. Not  sure if this is a complication of her chemo, or possibly APAP. Consider also shock liver. Her LFTs are improving.  P:   Avoid hepatotoxic drugs as able  Try to get off amio asap once beta blockers are in place F/u LFTs tonight and in afternoon  Consider portal vein thrombosis > check US doppler abdomen.  Diet  HEMATOLOGIC A:   Breast cancer. S/p last chemo 3/14 w/ Abraxane. She has missed scheduled chemo and says she's not going to take it anymore. P:  [place] heparin, may need to consider gtt.  Trend CBC Heme/onc aware   INFECTIOUS A:  Trend fever curve  P:   UC 4/15>>> Rocephin 4/15>>>  ENDOCRINE A:  No acute  P:   Trend glucose   NEUROLOGIC A:   Pain  Intermittent acute encephalopathy  P:   RASS goal: 0 Hold all sedation  Supportive care   Hemodynamics improved, liver function improving and rhabdo improving.  Will sign off, please call back if needed.  Rush Farmer, M.D. [facility] Pulmonary/Critical Care Medicine. Pager: [phone]. After hours pager: [phone].  08/29/2014, 12:11 PM

## 2014-08-30 ENCOUNTER — Other Ambulatory Visit (HOSPITAL_COMMUNITY): Payer: Self-pay | Admitting: Hematology & Oncology

## 2014-08-30 DIAGNOSIS — K759 Inflammatory liver disease, unspecified: Secondary | ICD-10-CM

## 2014-08-30 DIAGNOSIS — N189 Chronic kidney disease, unspecified: Secondary | ICD-10-CM

## 2014-08-30 LAB — COMPREHENSIVE METABOLIC PANEL
ALT: 121 U/L — ABNORMAL HIGH (ref 0–35)
ANION GAP: 7 (ref 5–15)
AST: 238 U/L — ABNORMAL HIGH (ref 0–37)
Albumin: 2.6 g/dL — ABNORMAL LOW (ref 3.5–5.2)
Alkaline Phosphatase: 234 U/L — ABNORMAL HIGH (ref 39–117)
BILIRUBIN TOTAL: 2.1 mg/dL — AB (ref 0.3–1.2)
BUN: 37 mg/dL — AB (ref 6–23)
CO2: 28 mmol/L (ref 19–32)
Calcium: 8.3 mg/dL — ABNORMAL LOW (ref 8.4–10.5)
Chloride: 103 mmol/L (ref 96–112)
Creatinine, Ser: 2.15 mg/dL — ABNORMAL HIGH (ref 0.50–1.10)
GFR calc Af Amer: 26 mL/min — ABNORMAL LOW (ref >90)
GFR, EST NON AFRICAN AMERICAN: 22 mL/min — AB (ref >90)
Glucose, Bld: 139 mg/dL — ABNORMAL HIGH (ref 70–99)
POTASSIUM: 3.8 mmol/L (ref 3.5–5.1)
Sodium: 138 mmol/L (ref 135–145)
TOTAL PROTEIN: 5 g/dL — AB (ref 6.0–8.3)

## 2014-08-30 LAB — CBC
HCT: 37.1 % (ref 36.0–46.0)
Hemoglobin: 12 g/dL (ref 12.0–15.0)
MCH: 28.5 pg (ref 26.0–34.0)
MCHC: 32.3 g/dL (ref 30.0–36.0)
MCV: 88.1 fL (ref 78.0–100.0)
PLATELETS: 29 10*3/uL — AB (ref 150–400)
RBC: 4.21 MIL/uL (ref 3.87–5.11)
RDW: 20.1 % — AB (ref 11.5–15.5)
WBC: 9.9 10*3/uL (ref 4.0–10.5)

## 2014-08-30 LAB — CK: CK TOTAL: 1226 U/L — AB (ref 7–177)

## 2014-08-30 LAB — AMMONIA: Ammonia: 26 umol/L (ref 11–32)

## 2014-08-30 MED ORDER — DILTIAZEM HCL 25 MG/5ML IV SOLN
10.0000 mg | Freq: Once | INTRAVENOUS | Status: DC
  Filled 2014-08-30: qty 5

## 2014-08-30 NOTE — Consult Note (Signed)
Inpatient Hematology/Oncology Consultation   Name: Cheyenne Riggs      MRN: [id number]    Location: 1222/1222-01  Date: 08/30/2014 Time:11:46 AM   REFERRING PHYSICIAN:  Dr. 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  Last chemotherapy with abraxane was 3/14. Secondary to difficulty with abraxane additional chemotherapy was discontinued after her 3/14 treatment.   She has had multiple admissions including a recent admit on 4/4 with a wide complex tachycardia. She was given lovenox during that admission and only new medication at discharge was low dose metoprolol.   Her cancer treatment course has also been complicated by an admission for neutropenic fever and C. Diff Colitis. She has been on multiple courses of flagyl, just completing one in early April and restarting on 4/11.  She takes tylenol for pain but insists only a couple of times per week. The exact cause of her abnormal liver function on this admission  is unclear but LFT's are trending downward. Kidney function and CK are also improving.  She is on ceftriaxone for a UTI.  Platelets on admission were 68K and have been trending downward. Platelet count on 4/14 was 57K, ceftriaxone was added for her UTI. On 4/15 platelet count was 45K, amiodarone drip was added and prophylactic heparin was given.  Today platelets are at 29K. The patient has no stigmata of bleeding. Hgb/Hct and wbc count are preserved.  Peripheral smear significant for thrombocytopenia, no schistocytes, no other significant findings.   PAST MEDICAL HISTORY:   Past Medical History    Diagnosis Date  . Anxiety   . Back pain   . H/O bladder problems   . Cholelithiasis   . Depression   . High blood pressure   . Hypercholesteremia   . Kidney stones   . Gum disease   . Complication of anesthesia     bp goes up  . Wears glasses   . Wears dentures     top  . Kidney stones   . Cancer   . Breast cancer     ALLERGIES: Allergies  Allergen Reactions  . Ciprofloxacin Nausea And Vomiting  . Demerol [Meperidine] Hives  . Lidocaine     palpatations  . Other Swelling    Eye ointments  . Septra [Sulfamethoxazole-Trimethoprim] Nausea And Vomiting  . Codeine Rash  . Novocain [Procaine] Palpitations  . Vancomycin Rash      MEDICATIONS: I have reviewed the patient's current medications.     PAST SURGICAL HISTORY Past Surgical History  Procedure Laterality Date  . Riggs biopsy    . Mouth surgery    . Spine surgery    . Colonoscopy    . Tubal ligation    . Lithotripsy    . Rhinoplasty    . Back surgery  1989    lumb lam  . Breast biopsy Right 02/05/2014    invasive ductal cncer  . Abdominal hysterectomy  1982  . Cholecystectomy  2011    lap choli  . Radioactive seed guided mastectomy with axillary sentinel lymph node biopsy Right 02/27/2014    Procedure:  RADIOACTIVE SEED GUIDED RIGHT PARTIAL MASTECTOMY WITH AXILLARY SENTINEL LYMPH NODE BIOPSY;  Surgeon: Stark Klein, MD;  Location: [place];  Service: General;  Laterality: Right;  . Portacath placement N/A 02/27/2014    Procedure: INSERTION PORT-A-CATH;  Surgeon: Stark Klein, MD;  Location: [place];  Service: General;  Laterality: N/A;    FAMILY HISTORY: Family History  Problem Relation Age of Onset  . Stroke Mother     onset in her 52's  . Cancer - Other Father     eye cancer, black lung diseae  . Heart failure Sister   . Hypertension Brother     SOCIAL HISTORY:  reports that she quit smoking about 3 months ago. She does not have any smokeless tobacco history  on file. She reports that she does not drink alcohol or use illicit drugs.  Lives alone, son is helping to find an assisted living for her.  PERFORMANCE STATUS: The patient's performance status is 2 - Symptomatic, <50% confined to bed  PHYSICAL EXAM: Most Recent Vital Signs: Blood pressure 147/60, pulse 67, temperature 97.3 F (36.3 C), temperature source Oral, resp. rate 15, height $RemoveBe'5\' 3"'rYZeVrBlw$  (1.6 m), weight 182 lb 1.6 oz (82.6 kg), SpO2 95 %. General appearance: alert, cooperative, appears stated age, no distress and mildly obese Head: Normocephalic, without obvious abnormality, atraumatic, alopecia Throat: lips, mucosa, and tongue normal; teeth and gums normal and upper gums edentulous Lungs: clear to auscultation bilaterally Heart: S1, S2 normal Abdomen: soft, non-tender; bowel sounds normal; no masses,  no organomegaly Extremities: no edema, redness or tenderness in the calves or thighs Lymph nodes: Cervical, supraclavicular, and axillary nodes normal. Neurologic: Grossly normal  LABORATORY DATA:  Results for orders placed or performed during the hospital encounter of 08/26/14 (from the past 48 hour(s))  Basic metabolic panel     Status: Abnormal   Collection Time: 08/28/14  3:57 PM  Result Value Ref Range   Sodium 139 135 - 145 mmol/L   Potassium 4.9 3.5 - 5.1 mmol/L   Chloride 109 96 - 112 mmol/L   CO2 15 (L) 19 - 32 mmol/L   Glucose, Bld 98 70 - 99 mg/dL   BUN 36 (H) 6 - 23 mg/dL   Creatinine, Ser 3.05 (H) 0.50 - 1.10 mg/dL   Calcium 8.8 8.4 - 10.5 mg/dL   GFR calc non Af Amer 15 (L) >90 mL/min   GFR calc Af Amer 17 (L) >90 mL/min    Comment: (NOTE) The eGFR has been calculated using the CKD EPI equation. This calculation has not been validated in all clinical situations. eGFR's persistently <90 mL/min signify possible Chronic Kidney Disease.    Anion gap 15 5 - 15  Lactic acid, plasma     Status: Abnormal   Collection Time: 08/28/14  3:57 PM  Result Value Ref Range    Lactic Acid, Venous 3.6 (HH) 0.5 - 2.0 mmol/L    Comment: CRITICAL RESULT CALLED TO, READ BACK BY AND VERIFIED WITH: SPOKE WITH CAMBLE,L RN [phone] C[place],N   Blood gas, arterial     Status: Abnormal   Collection Time: 08/28/14  4:45 PM  Result Value Ref Range   FIO2 0.21 %   pH, Arterial 7.378 7.350 - 7.450   pCO2 arterial 22.5 (L) 35.0 - 45.0 mmHg   pO2, Arterial 73.3 (L) 80.0 - 100.0 mmHg   Bicarbonate 12.9 (L) 20.0 - 24.0 mEq/L   TCO2 11.5 0 - 100 mmol/L   Acid-base deficit 10.2 (  H) 0.0 - 2.0 mmol/L   O2 Saturation 93.0 %   Patient temperature 98.6    Collection site LEFT RADIAL    Drawn by [id number]    Sample type ARTERIAL DRAW    Allens test (pass/fail) PASS PASS  Clostridium Difficile by PCR     Status: None   Collection Time: 08/28/14  8:15 PM  Result Value Ref Range   C difficile by pcr NEGATIVE NEGATIVE  Lactic acid, plasma     Status: Abnormal   Collection Time: 08/29/14  4:50 AM  Result Value Ref Range   Lactic Acid, Venous 2.6 (HH) 0.5 - 2.0 mmol/L    Comment: REPEATED TO VERIFY CRITICAL RESULT CALLED TO, READ BACK BY AND VERIFIED WITH: Corena Herter [id number] @ [id number] BY J SCOTTON   Renal function panel     Status: Abnormal   Collection Time: 08/29/14  4:50 AM  Result Value Ref Range   Sodium 137 135 - 145 mmol/L   Potassium 4.3 3.5 - 5.1 mmol/L   Chloride 105 96 - 112 mmol/L   CO2 20 19 - 32 mmol/L   Glucose, Bld 163 (H) 70 - 99 mg/dL   BUN 41 (H) 6 - 23 mg/dL   Creatinine, Ser 2.87 (H) 0.50 - 1.10 mg/dL   Calcium 8.6 8.4 - 10.5 mg/dL   Phosphorus 5.0 (H) 2.3 - 4.6 mg/dL   Albumin 2.7 (L) 3.5 - 5.2 g/dL   GFR calc non Af Amer 16 (L) >90 mL/min   GFR calc Af Amer 18 (L) >90 mL/min    Comment: (NOTE) The eGFR has been calculated using the CKD EPI equation. This calculation has not been validated in all clinical situations. eGFR's persistently <90 mL/min signify possible Chronic Kidney Disease.    Anion gap 12 5 - 15  Comprehensive metabolic panel      Status: Abnormal   Collection Time: 08/29/14  4:50 AM  Result Value Ref Range   Sodium 136 135 - 145 mmol/L   Potassium 4.4 3.5 - 5.1 mmol/L   Chloride 105 96 - 112 mmol/L   CO2 21 19 - 32 mmol/L   Glucose, Bld 164 (H) 70 - 99 mg/dL   BUN 39 (H) 6 - 23 mg/dL   Creatinine, Ser 2.80 (H) 0.50 - 1.10 mg/dL   Calcium 8.5 8.4 - 10.5 mg/dL   Total Protein 4.9 (L) 6.0 - 8.3 g/dL   Albumin 2.7 (L) 3.5 - 5.2 g/dL   AST 529 (H) 0 - 37 U/L   ALT 179 (H) 0 - 35 U/L   Alkaline Phosphatase 275 (H) 39 - 117 U/L   Total Bilirubin 2.8 (H) 0.3 - 1.2 mg/dL   GFR calc non Af Amer 16 (L) >90 mL/min   GFR calc Af Amer 19 (L) >90 mL/min    Comment: (NOTE) The eGFR has been calculated using the CKD EPI equation. This calculation has not been validated in all clinical situations. eGFR's persistently <90 mL/min signify possible Chronic Kidney Disease.    Anion gap 10 5 - 15  CK     Status: Abnormal   Collection Time: 08/29/14  4:50 AM  Result Value Ref Range   Total CK 2364 (H) 7 - 177 U/L  Acetaminophen level     Status: Abnormal   Collection Time: 08/29/14  4:50 AM  Result Value Ref Range   Acetaminophen (Tylenol), Serum <10.0 (L) 10 - 30 ug/mL    Comment:        THERAPEUTIC  CONCENTRATIONS VARY SIGNIFICANTLY. A RANGE OF 10-30 ug/mL MAY BE AN EFFECTIVE CONCENTRATION FOR MANY PATIENTS. HOWEVER, SOME ARE BEST TREATED AT CONCENTRATIONS OUTSIDE THIS RANGE. ACETAMINOPHEN CONCENTRATIONS >150 ug/mL AT 4 HOURS AFTER INGESTION AND >50 ug/mL AT 12 HOURS AFTER INGESTION ARE OFTEN ASSOCIATED WITH TOXIC REACTIONS.   Comprehensive metabolic panel     Status: Abnormal   Collection Time: 08/30/14  5:15 AM  Result Value Ref Range   Sodium 138 135 - 145 mmol/L   Potassium 3.8 3.5 - 5.1 mmol/L   Chloride 103 96 - 112 mmol/L   CO2 28 19 - 32 mmol/L   Glucose, Bld 139 (H) 70 - 99 mg/dL   BUN 37 (H) 6 - 23 mg/dL   Creatinine, Ser 2.15 (H) 0.50 - 1.10 mg/dL   Calcium 8.3 (L) 8.4 - 10.5 mg/dL   Total  Protein 5.0 (L) 6.0 - 8.3 g/dL   Albumin 2.6 (L) 3.5 - 5.2 g/dL   AST 238 (H) 0 - 37 U/L   ALT 121 (H) 0 - 35 U/L   Alkaline Phosphatase 234 (H) 39 - 117 U/L   Total Bilirubin 2.1 (H) 0.3 - 1.2 mg/dL   GFR calc non Af Amer 22 (L) >90 mL/min   GFR calc Af Amer 26 (L) >90 mL/min    Comment: (NOTE) The eGFR has been calculated using the CKD EPI equation. This calculation has not been validated in all clinical situations. eGFR's persistently <90 mL/min signify possible Chronic Kidney Disease.    Anion gap 7 5 - 15  CBC     Status: Abnormal   Collection Time: 08/30/14  5:15 AM  Result Value Ref Range   WBC 9.9 4.0 - 10.5 K/uL    Comment: ADJUSTED FOR NUCLEATED RBC'S   RBC 4.21 3.87 - 5.11 MIL/uL   Hemoglobin 12.0 12.0 - 15.0 g/dL   HCT 37.1 36.0 - 46.0 %   MCV 88.1 78.0 - 100.0 fL   MCH 28.5 26.0 - 34.0 pg   MCHC 32.3 30.0 - 36.0 g/dL   RDW 20.1 (H) 11.5 - 15.5 %   Platelets 29 (LL) 150 - 400 K/uL    Comment: REPEATED TO VERIFY SPECIMEN CHECKED FOR CLOTS PLATELET COUNT CONFIRMED BY SMEAR CRITICAL RESULT CALLED TO, READ BACK BY AND VERIFIED WITH: SARA BETH EARLY,RN [id number] @ [id number] BY J SCOTTON   CK     Status: Abnormal   Collection Time: 08/30/14  5:15 AM  Result Value Ref Range   Total CK 1226 (H) 7 - 177 U/L  Ammonia     Status: None   Collection Time: 08/30/14  5:15 AM  Result Value Ref Range   Ammonia 26 11 - 32 umol/L      RADIOGRAPHY: US Renal  08/28/2014   CLINICAL DATA:  Acute renal failure.  EXAM: RENAL/URINARY TRACT ULTRASOUND COMPLETE  COMPARISON:  CT abdomen and pelvis August 26, 2014  FINDINGS: Right Kidney:  Length: 10.6 cm. Echogenicity and renal cortical thickness are within normal limits. No mass, perinephric fluid, or hydronephrosis visualized. No sonographically demonstrable calculus or ureterectasis.  Left Kidney:  Length: 7.8 cm. The left kidney is atrophic with areas of scarring. There are several lower pole region calculi, largest measuring 9 mm. There is  no demonstrable mass, hydronephrosis, or perinephric fluid. No ureterectasis.  Bladder:  Appears normal for degree of bladder distention.  IMPRESSION: Comparatively atrophic left kidney with lower pole calculi. Appearance is commensurate with recent CT. There is no mass or hydronephrosis on  either side. No perinephric fluid collections are appreciable. Right kidney appears normal by ultrasound.   Electronically Signed   By: Lowella Grip III M.D.   On: 08/28/2014 16:19   [place] Art/ven Flow Abd Pelv Doppler  08/29/2014   CLINICAL DATA:  69 year old female with acute renal failure  EXAM: DUPLEX ULTRASOUND OF LIVER  TECHNIQUE: Color and duplex Doppler ultrasound was performed to evaluate the hepatic in-flow and out-flow vessels.  COMPARISON:  Renal ultrasound 08/28/2014; CT abdomen/ pelvis 08/26/2014  FINDINGS: Portal Vein Velocities  Main:  18-27 cm/sec with normal hepatopetal flow.  Right:  Unable to visualize  Left:  Unable to visualize  Hepatic Vein Velocities  Right:  33 cm/sec  Middle:  38 cm/sec  Left:  29 cm/sec  Hepatic Artery Velocity:  69 cm/sec  Splenic Vein Velocity:  20 cm/sec, difficult to visualize.  Varices: None visualized.  Ascites: None.  Bilateral right larger than left pleural effusions. The liver is echogenic suggesting fatty infiltration.  IMPRESSION: 1. Main portal vein is patent with normal hepatopetal flow. Intrahepatic portal veins are difficult image secondary to a combination of hepatic steatosis and patient body habitus. Given the unremarkable appearance of the main portal vein, significant intrahepatic portal venous thrombosis is considered somewhat unlikely. 2. Hepatic steatosis. 3. Hepatic veins remain patent. 4. Right larger than left pleural effusions.   Electronically Signed   By: Jacqulynn Cadet M.D.   On: 08/29/2014 13:45   Dg Chest Port 1 View  08/28/2014   CLINICAL DATA:  Central line placement.  EXAM: PORTABLE CHEST - 1 VIEW  COMPARISON:  08/26/2014  FINDINGS: New left  internal jugular central venous catheter tip in the proximal SVC. There is no pneumothorax. The right chest port remains in place, tip in the SVC. Cardiomegaly is unchanged. Hazy opacity at both lung bases, left greater than right, likely effusions and not significantly changed allowing for differences in technique. Stable prominence of central pulmonary vasculature. No new airspace disease.  IMPRESSION: Tip of the left internal jugular central venous catheter in the SVC. No pneumothorax.   Electronically Signed   By: Jeb Levering M.D.   On: 08/28/2014 18:17       PATHOLOGY:  Smear as reviewed above.    ASSESSMENT:  Hepatitis -- improving Thrombocytopenia, isolated Acute on Chronic renal failure UTI Stage IIA triple negative carcinoma of the breast, last abraxane given on 07/27/2014  PLAN:   Exact cause of her thrombocytopenia is unclear but given prior lovenox exposure on 4/4 and platelet count on admission would hold additional heparin. Will order a HIT panel although I feel her SRA will be negative. Her pretest probability is intermediate based on exposure from 4/4-4/6, fall in platelet count, nadir >20K, and possibility that acute liver injury may be the cause.   Her thrombocytopenia may be secondary to her acute liver dysfunction and now current medications.  ITP is also certainly a possibility but a diagnosis of exclusion. Although she has been on flagyl off/on since January, it is unlikely to be the cause of her thrombocytopenia.  For now would give platelets if she falls below 20K or if she has active signs of bleeding. I would hold heparin. Recheck CBC in the am. Call if additional problems or concerns.   Molli Hazard MD

## 2014-08-30 NOTE — Progress Notes (Signed)
Patient converted from NSR to SVT with rates 150's-160's at approximately 0415. Patients BP was 94/53 at that time, therefore Neosynephrine gtt was started and orders were placed for IV Cardizem. Patient converted back to NSR with rates in the 60's on her own at approximately 0430. Neosynephrine gtt was stopped. Will continue to monitor for changes in heart rate or rhythm. Luther Parody, RN

## 2014-08-30 NOTE — Evaluation (Signed)
Physical Therapy Evaluation Patient Details Name: Cheyenne Riggs MRN: [id number] DOB: [birth year] Today's Date: 08/30/2014   History of Present Illness  Pt is 69 yo female with known breast cancer and currently undergoing Abraxane chemotherapy, with chronic nausea since starting chemo, recent diagnosis of C. Diff and completed treatment with flagyl, discharged on April 6th, 2016 after being hospitalized for evaluation of wide complex tachycardia, elevated troponins, treated with beta blocker, acute on chronic combined systolic and diastolic CHF (last 2 D ECHO EF 35%) presented to [facility] ED with main concern of generalized weakness after taking dose of Ativan at home. Currently with acute transaminitis, acute on chronic renal failure SVT with hypotension, metabolic acidoisis and UTI.   Clinical Impression  Pt presents with great weakness and decreased ability with all mobility. To benefit from further PT to increase ability with all mobility.     Follow Up Recommendations SNF    Equipment Recommendations       Recommendations for Other Services       Precautions / Restrictions        Mobility  Bed Mobility Overal bed mobility: +2 for physical assistance;Needs Assistance Bed Mobility: Rolling;Supine to Sit;Sit to Supine Rolling: +2 for physical assistance;Max assist   Supine to sit: Max assist;+2 for physical assistance Sit to supine: Total assist;+2 for physical assistance   General bed mobility comments: and difficult for pt due to ICU air mattress as well.   Transfers Overall transfer level:  (unable to tolerate transfter, only sitting EOB taday. recoomend lift for nursing due to weakness of LEs at this time. )                  Ambulation/Gait                Stairs            Wheelchair Mobility    Modified Rankin (Stroke Patients Only)       Balance Overall balance assessment: Needs assistance Sitting-balance support: Bilateral upper extremity  supported Sitting balance-Leahy Scale: Poor Sitting balance - Comments: sat EOB for about 7 minutes for tolerance. Tried reaching activities with pt listing posteriorly and requiring VC and tactile cues, and assistance to correct LOB at EOB. really fatiguesd after 7-8 minutes EOB.                                      Pertinent Vitals/Pain Pain Assessment: No/denies pain (had some pain with touch to L toes, especially her big toe)    Home Living Family/patient expects to be discharged to:: Private residence (aware she may need to go to a facility this time) Living Arrangements: Alone Available Help at Discharge: Family Type of Home: House Home Access: Stairs to enter Entrance Stairs-Rails: Can reach both (church put in railings for her) Technical brewer of Steps: 3 Home Layout: Two level;Able to live on main level with bedroom/bathroom Home Equipment: Gilford Rile - 2 wheels;Cane - single point Additional Comments: stated therapy was working with her at her home     Prior Function Level of Independence: Independent with assistive device(s) (prior to these recetn illness was using cane intermittently)               Hand Dominance        Extremity/Trunk Assessment               Lower Extremity Assessment: RLE deficits/detail;LLE deficits/detail  RLE Deficits / Details: able to move ankle, however hips and knees 3-/5 , not able to move against gravity. Very weak in all hip and knee movements, and numbness in all toes and dorsum of foot.  LLE Deficits / Details: limited movment on R as well, however R slightly stronger than the L sie. 3-/5 for hip and knee and some slight sensation on dorsum of foot but no sensation in toes except for deep touch and this is painful     Communication   Communication: No difficulties (today a little groggy during session, little slurred , but easliy aroused and answered appropriately)  Cognition Arousal/Alertness: Awake/alert  (little groggy as stated before) Behavior During Therapy: WFL for tasks assessed/performed Overall Cognitive Status: Within Functional Limits for tasks assessed                      General Comments      Exercises        Assessment/Plan    PT Assessment Patient needs continued PT services  PT Diagnosis Generalized weakness;Difficulty walking   PT Problem List Decreased strength;Decreased activity tolerance;Decreased balance;Decreased mobility  PT Treatment Interventions DME instruction;Gait training;Functional mobility training;Therapeutic activities;Therapeutic exercise;Patient/family education   PT Goals (Current goals can be found in the Care Plan section) Acute Rehab PT Goals Patient Stated Goal: I now I may need a little longer and rehab to get stronger. I am very weak.  PT Goal Formulation: With patient Time For Goal Achievement: 09/13/14 Potential to Achieve Goals: Good    Frequency Min 3X/week   Barriers to discharge        Co-evaluation               End of Session   Activity Tolerance: Patient limited by fatigue Patient left: in bed;with call bell/phone within reach           Time: 1440-1520 PT Time Calculation (min) (ACUTE ONLY): 40 min   Charges:   PT Evaluation $Initial PT Evaluation Tier I: 1 Procedure PT Treatments $Therapeutic Activity: 23-37 mins   PT G CodesClide Dales 09-25-2014, 6:11 PM  Clide Dales, PT Pager: [phone] 09-25-14

## 2014-08-30 NOTE — Progress Notes (Addendum)
Patient ID: Cheyenne Riggs, female   DOB: 1946-02-02, 69 y.o.   MRN: [id number]  [facility] PROGRESS NOTE  SANA TESSMER NWG:[id number] DOB: 11/06/1945 DOA: 08/26/2014 PCP: Gennette Pac, MD   Brief narrative:    Pt is 69 yo female with known breast cancer and currently undergoing Abraxane chemotherapy, follows with Dr. Lindi Adie, with chronic nausea since starting chemo, recent diagnosis of C. Diff and completed treatment with flagyl, discharged on April 6th, 2016 after being hospitalized for evaluation of wide complex tachycardia, elevated troponins, treated with beta blocker, acute on chronic combined systolic and diastolic CHF (last 2 D ECHO EF 35%) presented to [facility] ED with main concern of generalized weakness after taking dose of Ativan at home. Please note that pt is rather sleepy on exam and vary slow to respond and therefore difficult to provide history. She denies chest pain or shortness of breath, no specific abd or urinary concerns.   In ED, pt noted to be hemodynamically stable, VSS, blood work notable for Cr 2.31 (up from recent value 4/10 Cr 1.13), Plt 68 (down from 129 on 08/23/14), severe transaminitis significantly above the last blood work on 4/10. CT abd with no clear acute finding to explain transaminitis.   Major events since admission: 4/15 - sudden hypotension with SVT requiring transfer to stepdown unit, central line placed, required pressors 4/16 - blood pressure stable, off pressors, LFTs and renal function improving 4/17 - intermittent episodes of SVT's, pt asymptomatic, Plt count dropping and oncology consulted   Assessment/Plan:    Active Problems: Acute transaminitis secondary to drug induced hepatotoxicity, ? Ischemic hepatitis  - CT abd with specific acute finding to explain transaminitis  - appears to be secondary to drug induced hepatotoxicity - Please note that patient initially denied taking Tylenol more than prescribed - upon further questioning  4/15, patient admitted to taking double to triple doses of tylenol for back pain and headaches - Liver tests still elevated but continue improving and trending down  - Tylenol level remains undetectable - Plan on repeating liver function tests in the morning  Acute on chronic renal failure stage III - last known Cr 1.13 on last admission, creatinine continues trending up - with elevated CK level likely rhabdomyolysis, progression from prerenal etiology to ATN, ? CIN (pt has received contrast for recent CT abd) - Systolic CHF with EF 57% precludes use of aggressive IV fluid hydration - Creatinine continues trending down  - Renal ultrasound 08/28/2014 with atrophic left kidney, unremarkable right kidney, no signs of hydronephrosis - Continue to monitor intake and output - Repeat BMP in the morning  SVT with hypotension, 08/28/2014 - Unclear at triggering event, required transfer to stepdown unit, with intermittent bradycardia  - pt currently off pressors but with intermittent episodes of SVTs overnight 4/16  - pt started on amiodarone drip 4/15 but given its hepatotoxicity, pt quickly tapered off amio - now seems since amio stopped and pt with more frequent SVT's events  - continue metoprolol per home medical regimen 25 mg by mouth twice a day and add metoprolol as needed   Metabolic acidosis - in the setting of acute on chronic renal failure and rhabdomyolysis and progression to ATN - resolved with Bicarb, will d/c today   Rhabdomyolysis - Patient responding well to IV fluids, CK level continue trending down  - repeat CK level in AM  Acute on chronic respiratory failure secondary to acute on chronic systolic and diastolic CHF - last 2 D ECHO with  EF 35%, known and persistent right side pleural effusion  - weight on admission 79 kg, now up this morning to 82 kg, could be due to different calibration scales from telemetry bed to SDU scale  - lasix still on hold, continuing IVF as noted  above with close monitoring of volume status to avoid pulmonary vascular congestion especially given known large right-sided pleural effusion - monitor daily weights, I's and O's - no ACEI in the setting or renal failure   Leukocytosis secondary to UTI - continue Rocephin day #4 - WBC is now WNL - follow up on urine culture   Acute urinary retention - placed foley  - renal US with no signs of hydronephrosis  Essential HTN - stable this AM off pressors over 24 hours   Breast cancer - notified Dr. Lindi Adie of pt's admission  - pt wants to stop chemotherapy, to think about PCT   Severe PCM - in the context of chronic illness - pt tolerating current diet well   Thrombocytopenia - unclear etiology - will consult oncology for further assistance  - avoid all heparin products - repeat CBC in AM  DVT prophylaxis - was on heparin SQ, for 48 hours while in SDU but will transition to SCD's  Code Status: Full.  Family Communication:  plan of care discussed with the patient Disposition Plan: Keep in SDU today and possible transfer to telemetry bed in AM Time spent 25 minutes   IV access:  Peripheral IV, IJ CVC  Procedures and diagnostic studies:    Ct Abdomen Pelvis Wo Contrast  08/26/2014   Large pleural effusions, right larger than left. Dependent pulmonary atelectasis.  Previous cholecystectomy and hysterectomy.  Left renal atrophy with nonobstructing renal stones in the lower pole.  Atherosclerosis of the aorta and its branch vessels.  Diverticulosis. Small amount of free fluid in the pelvis. Diverticulitis is not confidently demonstrated, though low level diverticulitis could be in apparent by imaging.     Dg Chest 2 View  08/26/2014   Resolving diffuse interstitial infiltrates. Stable cardiomegaly and persistent small layering bilateral pleural effusions.     US Renal  08/28/2014  Comparatively atrophic left kidney with lower pole calculi. Appearance is commensurate with recent CT.  There is no mass or hydronephrosis on either side. No perinephric fluid collections are appreciable. Right kidney appears normal by ultrasound.     [place] Art/ven Flow Abd Pelv Doppler  08/29/2014  Main portal vein is patent with normal hepatopetal flow. Intrahepatic portal veins are difficult image secondary to a combination of hepatic steatosis and patient body habitus. Given the unremarkable appearance of the main portal vein, significant intrahepatic portal venous thrombosis is considered somewhat unlikely. 2. Hepatic steatosis. 3. Hepatic veins remain patent. 4. Right larger than left pleural effusions.     Dg Chest Port 1 View  08/28/2014   Tip of the left internal jugular central venous catheter in the SVC. No pneumothorax.     Medical Consultants:  PCCM  Other Consultants:  None   IAnti-Infectives:   Rocephin 4/14 -->  Faye Ramsay, MD  [facility] Pager [phone]  If 7PM-7AM, please contact night-coverage [email or web address] Password [email or web address] 08/30/2014, 2:58 PM   LOS: 4 days   HPI/Subjective: Pt with back pain and headache. SVT episode overnight.   Objective: Filed Vitals:   08/30/14 0800 08/30/14 0958 08/30/14 1000 08/30/14 1245  BP: 103/77 147/60 147/60   Pulse: 66 67 162   Temp:    98.5 F (36.9 C)  TempSrc:  Oral  Resp: 15  12   Height:      Weight:      SpO2: 85%  90%     Intake/Output Summary (Last 24 hours) at 08/30/14 1458 Last data filed at 08/30/14 1347  Gross per 24 hour  Intake   2255 ml  Output    150 ml  Net   2105 ml    Exam:   General:  Pt is alert, follows commands appropriately, not in acute distress, frail and weak   Cardiovascular: Regular rhythm, tachycardic, S1/S2, no rubs, no gallops  Respiratory: No wheezing, some crackles on the right side   Abdomen: Soft, non tender, non distended, bowel sounds present, no guarding  Extremities: pulses DP and PT palpable bilaterally  Neuro: Grossly nonfocal  Data Reviewed: Basic Metabolic  Panel:  Recent Labs Lab 08/23/14 2038  08/26/14 1945 08/27/14 0550 08/28/14 0450 08/28/14 1557 08/29/14 0450 08/30/14 0515  NA 137  < > 138 137 136 139 136  137 138  K 2.7*  < > 4.5 4.8 4.5 4.9 4.4  4.3 3.8  CL 99  < > 106 107 106 109 105  105 103  CO2 26  < > 18* 19 20 15* 21  20 28   GLUCOSE 116*  < > 85 69* 92 98 164*  163* 139*  BUN 9  < > 17 21 32* 36* 39*  41* 37*  CREATININE 1.13*  < > 2.32* 2.61* 3.18* 3.05* 2.80*  2.87* 2.15*  CALCIUM 8.9  < > 9.0 8.8 8.4 8.8 8.5  8.6 8.3*  MG 1.5  --  1.8  --   --   --   --   --   PHOS  --   --  6.9*  --   --   --  5.0*  --   < > = values in this interval not displayed. Liver Function Tests:  Recent Labs Lab 08/26/14 1945 08/27/14 0550 08/28/14 0450 08/29/14 0450 08/30/14 0515  AST 1027* 1213* 1153* 529* 238*  ALT 221* 246* 242* 179* 121*  ALKPHOS 303* 336* 299* 275* 234*  BILITOT 3.6* 3.7* 2.8* 2.8* 2.1*  PROT 5.7* 5.7* 5.2* 4.9* 5.0*  ALBUMIN 3.1* 3.2* 2.8* 2.7*  2.7* 2.6*    Recent Labs Lab 08/23/14 2038 08/26/14 1226  LIPASE 23 16   CBC:  Recent Labs Lab 08/23/14 2038 08/26/14 1213 08/27/14 0550 08/28/14 0450 08/30/14 0515  WBC 4.8 10.6* 13.5* 12.2* 9.9  NEUTROABS 3.7 8.7*  --   --   --   HGB 11.8* 12.1 12.3 12.7 12.0  HCT 37.2 37.5 39.3 40.5 37.1  MCV 86.7 87.4 88.7 89.0 88.1  PLT 129* 68* 57* 45* 29*   Cardiac Enzymes:  Recent Labs Lab 08/26/14 1226 08/27/14 0550 08/28/14 0450 08/29/14 0450 08/30/14 0515  CKTOTAL 2202* 2709* 3297* 2364* 1226*  CKMB  --   --  49.6*  --   --    Scheduled Meds: . antiseptic oral rinse  7 mL Mouth Rinse q12n4p  . cefTRIAXone (ROCEPHIN)  IV  1 g Intravenous Q24H  . chlorhexidine  15 mL Mouth Rinse BID  . metoprolol tartrate  25 mg Oral BID  . sodium chloride  3 mL Intravenous Q12H   Continuous Infusions: . sodium chloride Stopped (08/28/14 1730)  . phenylephrine (NEO-SYNEPHRINE) Adult infusion Stopped (08/30/14 0439)  .  sodium bicarbonate  infusion  1000 mL 75 mL/hr at 08/30/14 1338

## 2014-08-30 NOTE — Progress Notes (Signed)
CRITICAL VALUE ALERT  Critical value received:  Platelet 29  Date of notification:  08/30/2014  Time of notification:  0714  Critical value read back:Yes.    Nurse who received alert:  Luther Parody, RN  MD notified (1st page):  Doyle Askew  Time of first page:  [phone]  MD notified (2nd page):  Time of second page:  Responding MD:    Time MD responded:

## 2014-08-31 ENCOUNTER — Ambulatory Visit: Payer: Medicare Other

## 2014-08-31 ENCOUNTER — Encounter (HOSPITAL_COMMUNITY): Payer: Self-pay | Admitting: Cardiology

## 2014-08-31 DIAGNOSIS — C50411 Malignant neoplasm of upper-outer quadrant of right female breast: Secondary | ICD-10-CM

## 2014-08-31 DIAGNOSIS — I471 Supraventricular tachycardia: Secondary | ICD-10-CM

## 2014-08-31 DIAGNOSIS — R001 Bradycardia, unspecified: Secondary | ICD-10-CM

## 2014-08-31 DIAGNOSIS — I499 Cardiac arrhythmia, unspecified: Secondary | ICD-10-CM

## 2014-08-31 DIAGNOSIS — D6959 Other secondary thrombocytopenia: Secondary | ICD-10-CM

## 2014-08-31 LAB — CBC
HCT: 36.7 % (ref 36.0–46.0)
Hemoglobin: 11.7 g/dL — ABNORMAL LOW (ref 12.0–15.0)
MCH: 28.4 pg (ref 26.0–34.0)
MCHC: 31.9 g/dL (ref 30.0–36.0)
MCV: 89.1 fL (ref 78.0–100.0)
Platelets: 30 10*3/uL — ABNORMAL LOW (ref 150–400)
RBC: 4.12 MIL/uL (ref 3.87–5.11)
RDW: 20.6 % — ABNORMAL HIGH (ref 11.5–15.5)
WBC: 8.2 10*3/uL (ref 4.0–10.5)

## 2014-08-31 LAB — COMPREHENSIVE METABOLIC PANEL
ALT: 91 U/L — ABNORMAL HIGH (ref 0–35)
AST: 150 U/L — ABNORMAL HIGH (ref 0–37)
Albumin: 2.5 g/dL — ABNORMAL LOW (ref 3.5–5.2)
Alkaline Phosphatase: 206 U/L — ABNORMAL HIGH (ref 39–117)
Anion gap: 8 (ref 5–15)
BUN: 32 mg/dL — ABNORMAL HIGH (ref 6–23)
CO2: 29 mmol/L (ref 19–32)
Calcium: 7.9 mg/dL — ABNORMAL LOW (ref 8.4–10.5)
Chloride: 97 mmol/L (ref 96–112)
Creatinine, Ser: 1.59 mg/dL — ABNORMAL HIGH (ref 0.50–1.10)
GFR, EST AFRICAN AMERICAN: 37 mL/min — AB (ref >90)
GFR, EST NON AFRICAN AMERICAN: 32 mL/min — AB (ref >90)
Glucose, Bld: 141 mg/dL — ABNORMAL HIGH (ref 70–99)
Potassium: 3.6 mmol/L (ref 3.5–5.1)
Sodium: 134 mmol/L — ABNORMAL LOW (ref 135–145)
TOTAL PROTEIN: 4.9 g/dL — AB (ref 6.0–8.3)
Total Bilirubin: 1.9 mg/dL — ABNORMAL HIGH (ref 0.3–1.2)

## 2014-08-31 LAB — URINE CULTURE

## 2014-08-31 LAB — HEPARIN INDUCED THROMBOCYTOPENIA PNL: HEPARIN INDUCED PLT AB: 0.17 {OD_unit} (ref 0.000–0.400)

## 2014-08-31 LAB — CK: Total CK: 918 U/L — ABNORMAL HIGH (ref 7–177)

## 2014-08-31 MED ORDER — LORAZEPAM 2 MG/ML IJ SOLN
0.5000 mg | Freq: Once | INTRAMUSCULAR | Status: AC
  Administered 2014-08-31: 0.5 mg via INTRAVENOUS
  Filled 2014-08-31: qty 1

## 2014-08-31 NOTE — Progress Notes (Signed)
Physical Therapy Treatment Patient Details Name: Cheyenne Riggs MRN: [id number] DOB: [birth year] Today's Date: 08/31/2014    History of Present Illness Pt is 69 yo female with known breast cancer and currently undergoing Abraxane chemotherapy, with chronic nausea since starting chemo, recent diagnosis of C. Diff and completed treatment with flagyl, discharged on April 6th, 2016 after being hospitalized for evaluation of wide complex tachycardia, elevated troponins, treated with beta blocker, acute on chronic combined systolic and diastolic CHF (last 2 D ECHO EF 35%) presented to [facility] ED with main concern of generalized weakness after taking dose of Ativan at home. Currently with acute transaminitis, acute on chronic renal failure SVT with hypotension, metabolic acidoisis and UTI.     PT Comments    Pt agreeable to a short session of PT. She didn't want to do very much. Tolerated sitting EOB again fairly well. Pt was able to do a few LE exercises in sitting. Continue to recommend SNF  Follow Up Recommendations  SNF     Equipment Recommendations  None recommended by PT    Recommendations for Other Services       Precautions / Restrictions Precautions Precautions: Fall Restrictions Weight Bearing Restrictions: No    Mobility  Bed Mobility Overal bed mobility: Needs Assistance Bed Mobility: Supine to Sit;Sit to Supine;Rolling Rolling: Max assist   Supine to sit: Max assist;+2 for physical assistance;+2 for safety/equipment Sit to supine: Max assist;+2 for physical assistance;+2 for safety/equipment   General bed mobility comments: Assist for all aspects of bed mobility. Increased time to complete task. Utilized bedpad for scooting, positining.   Transfers                 General transfer comment: NT-pt unable to tolerate on today/pt declined  Ambulation/Gait                 Stairs            Wheelchair Mobility    Modified Rankin (Stroke Patients Only)        Balance Overall balance assessment: Needs assistance Sitting-balance support: Bilateral upper extremity supported;Feet supported Sitting balance-Leahy Scale: Poor Sitting balance - Comments: Sat EOB-Leaning to R and posteriorly at times. VCs/tactile cues for pt to self correct.                             Cognition Arousal/Alertness: Awake/alert Behavior During Therapy: WFL for tasks assessed/performed Overall Cognitive Status: Within Functional Limits for tasks assessed                      Exercises General Exercises - Lower Extremity Ankle Circles/Pumps: AROM;Both;10 reps;Seated Long Arc Quad: AROM;5 reps;Both;Seated    General Comments        Pertinent Vitals/Pain Pain Assessment: Faces Faces Pain Scale: Hurts little more Pain Location: c/o bil foot pain. Noted toes to be a blueish/bruised tint-L worse than R Pain Descriptors / Indicators: Sore Pain Intervention(s): Monitored during session;Repositioned    Home Living                      Prior Function            PT Goals (current goals can now be found in the care plan section) Progress towards PT goals: Progressing toward goals (very slowly)    Frequency  Min 3X/week    PT Plan Current plan remains appropriate    Co-evaluation  End of Session   Activity Tolerance: Patient limited by fatigue Patient left: in bed;with call bell/phone within reach;with bed alarm set     Time: 7902-4097 PT Time Calculation (min) (ACUTE ONLY): 22 min  Charges:  $Therapeutic Activity: 8-22 mins                    G Codes:      Weston Anna, MPT Pager: [phone]

## 2014-08-31 NOTE — Progress Notes (Signed)
Patient ID: Cheyenne Riggs, female   DOB: [birth year], 69 y.o.   MRN: [id number]  [facility] PROGRESS NOTE  Cheyenne Riggs JJO:[id number] DOB: 1946/04/13 DOA: 08/26/2014 PCP: Gennette Pac, MD   Brief narrative:    Pt is 69 yo female with known breast cancer and currently undergoing Abraxane chemotherapy, follows with Dr. Lindi Adie, with chronic nausea since starting chemo, recent diagnosis of C. Diff and completed treatment with flagyl, discharged on April 6th, 2016 after being hospitalized for evaluation of wide complex tachycardia, elevated troponins, treated with beta blocker, acute on chronic combined systolic and diastolic CHF (last 2 D ECHO EF 35%) presented to [facility] ED with main concern of generalized weakness after taking dose of Ativan at home. Please note that pt is rather sleepy on exam and vary slow to respond and therefore difficult to provide history. She denies chest pain or shortness of breath, no specific abd or urinary concerns.   In ED, pt noted to be hemodynamically stable, VSS, blood work notable for Cr 2.31 (up from recent value 4/10 Cr 1.13), Plt 68 (down from 129 on 08/23/14), severe transaminitis significantly above the last blood work on 4/10. CT abd with no clear acute finding to explain transaminitis.   Major events since admission: 4/15 - sudden hypotension with SVT requiring transfer to stepdown unit, central line placed, required pressors, amiodarone drip 4/16 - blood pressure stable, off pressors, LFTs and renal function improving 4/17 - intermittent episodes of SVT's wide complex, pt asymptomatic, Plt count dropping and oncology consulted, stopped amiodarone  4/18 - still with intermittent SVT's, right foot toe ischemic, pt with no chest pain this AM  Assessment/Plan:    Active Problems: Acute transaminitis secondary to drug induced hepatotoxicity, ? Ischemic hepatitis  - CT abd with specific acute finding to explain transaminitis  - secondary to drug  induced hepatotoxicity - Please note that patient initially denied taking Tylenol more than prescribed - upon further questioning 4/15, patient admitted to taking double to triple doses of tylenol for back pain and headaches - Liver tests still elevated but continue improving and trending down  - Tylenol level remains undetectable - Plan on repeating liver function tests in the morning  Acute on chronic renal failure stage III - last known Cr 1.13 on last admission, creatinine continues trending up - with elevated CK level likely rhabdomyolysis, progression from prerenal etiology to ATN, ? CIN (pt has received contrast for recent CT abd) - Systolic CHF with EF 16% precludes use of aggressive IV fluid hydration - Creatinine continues trending down  - Renal ultrasound 08/28/2014 with atrophic left kidney, unremarkable right kidney, no signs of hydronephrosis - Continue to monitor intake and output - Repeat BMP in the morning - stop IVF today and monitor renal, liver function, CK level   Right foot great toe ischemic - pulses palpable but faint, monitor closely  SVT with hypotension, 08/28/2014 - Unclear of triggering event, required transfer to stepdown unit 4/15, with intermittent bradycardia episodes  - pt currently off pressors but with intermittent episodes of SVTs overnight 4/16, 4/17 - pt started on amiodarone drip 4/15 but given its hepatotoxicity, pt quickly tapered off amio - now seems since amio stopped and pt with more frequent SVT's events  - continue metoprolol per home medical regimen 25 mg by mouth twice a day and add metoprolol as needed  - cardiology team has been consulted   Metabolic acidosis - in the setting of acute on chronic renal failure and rhabdomyolysis  and progression to ATN - resolved with Bicarb, off bicarb since 4/17 - repeat BMP in AM  Rhabdomyolysis - Patient responding well to IV fluids, CK level continue trending down  - repeat CK level in  AM  Acute on chronic respiratory failure secondary to acute on chronic systolic and diastolic CHF, with hypoxia (O2 sat 89% on RA) - last 2 D ECHO with EF 35%, known and persistent right side pleural effusion  - weight on admission 79 kg, now up over the past 24 hours from 82 --> 87.6  - lasix still on hold as pt required continuing IVF as noted above  - close monitoring of volume status to avoid pulmonary vascular congestion especially given known large right-sided pleural effusion, EF 35%, and weight trending up in the past 48 hours  - monitor daily weights, I's and O's - no ACEI in the setting or renal failure  - keep on [place] - stop IVF   Leukocytosis secondary to UTI - continue Rocephin day #5/7 - WBC is now WNL - follow up on urine culture   Acute urinary retention - placed foley  - renal US with no signs of hydronephrosis  Essential HTN - stable this AM off pressors over 48 hours   Breast cancer - notified Dr. Lindi Adie of pt's admission  - pt wants to stop chemotherapy, to think about PCT   Severe PCM - in the context of chronic illness - pt tolerating current diet well   Thrombocytopenia - unclear etiology - appreciate oncologist recommendations  - avoiding all heparin products as recommended by oncologist  - repeat CBC in AM  DVT prophylaxis - was on heparin SQ, for 48 hours while in SDU but will transition to SCD's  Code Status: Full.  Family Communication:  plan of care discussed with the patient Disposition Plan: Keep in SDU today and possible transfer to telemetry bed in AM Time spent 25 minutes   IV access:  Peripheral IV, IJ CVC  Procedures and diagnostic studies:    Ct Abdomen Pelvis Wo Contrast  08/26/2014   Large pleural effusions, right larger than left. Dependent pulmonary atelectasis.  Previous cholecystectomy and hysterectomy.  Left renal atrophy with nonobstructing renal stones in the lower pole.  Atherosclerosis of the aorta and its branch vessels.   Diverticulosis. Small amount of free fluid in the pelvis. Diverticulitis is not confidently demonstrated, though low level diverticulitis could be in apparent by imaging.     Dg Chest 2 View  08/26/2014   Resolving diffuse interstitial infiltrates. Stable cardiomegaly and persistent small layering bilateral pleural effusions.     US Renal  08/28/2014  Comparatively atrophic left kidney with lower pole calculi. Appearance is commensurate with recent CT. There is no mass or hydronephrosis on either side. No perinephric fluid collections are appreciable. Right kidney appears normal by ultrasound.     [place] Art/ven Flow Abd Pelv Doppler  08/29/2014  Main portal vein is patent with normal hepatopetal flow. Intrahepatic portal veins are difficult image secondary to a combination of hepatic steatosis and patient body habitus. Given the unremarkable appearance of the main portal vein, significant intrahepatic portal venous thrombosis is considered somewhat unlikely. 2. Hepatic steatosis. 3. Hepatic veins remain patent. 4. Right larger than left pleural effusions.     Dg Chest Port 1 View  08/28/2014   Tip of the left internal jugular central venous catheter in the SVC. No pneumothorax.     Medical Consultants:  PCCM - signed off 4/16 Oncology  Cardiology  Other Consultants:  None   IAnti-Infectives:   Rocephin 4/14 -->  Faye Ramsay, MD  [facility] Pager [phone]  If 7PM-7AM, please contact night-coverage [email or web address] Password [email or web address] 08/31/2014, 12:03 PM   LOS: 5 days   HPI/Subjective: Pt with back pain and headache. SVT episodes overnight. No chest pain or shortness of breath, reports generalized weakness.   Objective: Filed Vitals:   08/31/14 0600 08/31/14 0745 08/31/14 0800 08/31/14 1000  BP:  118/72 119/56 127/72  Pulse: 74  62 67  Temp:  97.4 F (36.3 C)    TempSrc:  Oral    Resp: 11 14 13 16   Height:      Weight:      SpO2: 91% 100% 89% 99%    Intake/Output Summary (Last 24  hours) at 08/31/14 1203 Last data filed at 08/31/14 1100  Gross per 24 hour  Intake   3500 ml  Output    150 ml  Net   3350 ml    Exam:   General:  Pt is alert, follows commands appropriately, not in acute distress, frail and weak   Cardiovascular: Regular rhythm, rate, S1/S2, no rubs, no gallops  Respiratory: No wheezing, diminished breath sounds on the right side with crackles at bases   Abdomen: Soft, non tender, non distended, bowel sounds present, no guarding  Extremities: pulses DP and PT palpable bilaterally, right foot great toes purple   Neuro: bilateral LE weakness, strength 3/5, sensation intact in upper and lower extremities bilaterally  Data Reviewed: Basic Metabolic Panel:  Recent Labs Lab 08/26/14 1945  08/28/14 0450 08/28/14 1557 08/29/14 0450 08/30/14 0515 08/31/14 0540  NA 138  < > 136 139 136  137 138 134*  K 4.5  < > 4.5 4.9 4.4  4.3 3.8 3.6  CL 106  < > 106 109 105  105 103 97  CO2 18*  < > 20 15* 21  20 28 29   GLUCOSE 85  < > 92 98 164*  163* 139* 141*  BUN 17  < > 32* 36* 39*  41* 37* 32*  CREATININE 2.32*  < > 3.18* 3.05* 2.80*  2.87* 2.15* 1.59*  CALCIUM 9.0  < > 8.4 8.8 8.5  8.6 8.3* 7.9*  MG 1.8  --   --   --   --   --   --   PHOS 6.9*  --   --   --  5.0*  --   --   < > = values in this interval not displayed. Liver Function Tests:  Recent Labs Lab 08/27/14 0550 08/28/14 0450 08/29/14 0450 08/30/14 0515 08/31/14 0540  AST 1213* 1153* 529* 238* 150*  ALT 246* 242* 179* 121* 91*  ALKPHOS 336* 299* 275* 234* 206*  BILITOT 3.7* 2.8* 2.8* 2.1* 1.9*  PROT 5.7* 5.2* 4.9* 5.0* 4.9*  ALBUMIN 3.2* 2.8* 2.7*  2.7* 2.6* 2.5*    Recent Labs Lab 08/26/14 1226  LIPASE 16   CBC:  Recent Labs Lab 08/26/14 1213 08/27/14 0550 08/28/14 0450 08/30/14 0515 08/31/14 0540  WBC 10.6* 13.5* 12.2* 9.9 8.2  NEUTROABS 8.7*  --   --   --   --   HGB 12.1 12.3 12.7 12.0 11.7*  HCT 37.5 39.3 40.5 37.1 36.7  MCV 87.4 88.7 89.0 88.1  89.1  PLT 68* 57* 45* 29* 30*   Cardiac Enzymes:  Recent Labs Lab 08/27/14 0550 08/28/14 0450 08/29/14 0450 08/30/14 0515 08/31/14 0540  CKTOTAL 2709* 3297* 2364* 1226* 918*  CKMB  --  49.6*  --   --   --    Scheduled Meds: . antiseptic oral rinse  7 mL Mouth Rinse q12n4p  . cefTRIAXone (ROCEPHIN)  IV  1 g Intravenous Q24H  . chlorhexidine  15 mL Mouth Rinse BID  . metoprolol tartrate  25 mg Oral BID  . sodium chloride  3 mL Intravenous Q12H   Continuous Infusions: . sodium chloride 75 mL/hr at 08/31/14 0325  . phenylephrine (NEO-SYNEPHRINE) Adult infusion Stopped (08/30/14 0439)  .  sodium bicarbonate  infusion 1000 mL 75 mL/hr at 08/31/14 0324

## 2014-08-31 NOTE — Progress Notes (Signed)
CSW continuing to follow.   Pt admitted from home alone and agreeable to rehab at [facility] upon discharge.  Pt was provided SNF bed offers prior to transferring down to ICU.   CSW followed up with pt at bedside today to provide support and further discuss disposition planning.   Pt discussed that she is aware of bed offers, but has not yet made decision regarding SNF. CSW provided support as pt discussed that she does not want to pursue any further chemotherapy at this time. CSW discussed with pt that initial initiation of SNF search indicated that pt may be receiving treatment and if pt agreeable then CSW can re-initiate SNF search to determine if there are any other bed offers since pt does not wish to pursue further chemotherapy. Pt agreeable of re-initiation of SNF search and hopeful that she will have additional options to choose from.   CSW updated pt FL2 and uploaded updated clinicals to [facility] and re-initiated SNF search to [facility] and [facility].   CSW to follow up with pt regarding updated SNF bed offers.  CSW to continue to follow to provide support and assist with pt disposition needs.   Alison Murray, MSW, [place] Work [phone]

## 2014-08-31 NOTE — Progress Notes (Signed)
SUBJECTIVE: drowsy from morphine. Feels terrible and weak. Very scared. Admitted with wide complex tachycardia Toes turning blue  OBJECTIVE PHYSICAL EXAMINATION: ECOG PERFORMANCE STATUS: 3 - Symptomatic, >50% confined to bed  Filed Vitals:   08/31/14 1602  BP: 124/85  Pulse: 51  Temp:   Resp: 13   Filed Weights   08/29/14 0500 08/30/14 0444 08/31/14 0300  Weight: 182 lb 1.6 oz (82.6 kg) 182 lb 1.6 oz (82.6 kg) 193 lb 2 oz (87.6 kg)    GENERAL:drowsy but awake, answered questions SKIN: Left central line LUNGS: clear to auscultation and percussion with normal breathing effort HEART: Soft ESM ABDOMEN:abdomen soft, non-tender and normal bowel sounds Musculoskeletal:Toes cyanosis  NEURO: alert & oriented x 3 with fluent speech, no focal motor/sensory deficits  LABORATORY DATA:  I have reviewed the data as listed CMP Latest Ref Rng 08/31/2014 08/30/2014 08/29/2014  Glucose 70 - 99 mg/dL 141(H) 139(H) 164(H)  BUN 6 - 23 mg/dL 32(H) 37(H) 39(H)  Creatinine 0.50 - 1.10 mg/dL 1.59(H) 2.15(H) 2.80(H)  Sodium 135 - 145 mmol/L 134(L) 138 136  Potassium 3.5 - 5.1 mmol/L 3.6 3.8 4.4  Chloride 96 - 112 mmol/L 97 103 105  CO2 19 - 32 mmol/L 29 28 21   Calcium 8.4 - 10.5 mg/dL 7.9(L) 8.3(L) 8.5  Total Protein 6.0 - 8.3 g/dL 4.9(L) 5.0(L) 4.9(L)  Total Bilirubin 0.3 - 1.2 mg/dL 1.9(H) 2.1(H) 2.8(H)  Alkaline Phos 39 - 117 U/L 206(H) 234(H) 275(H)  AST 0 - 37 U/L 150(H) 238(H) 529(H)  ALT 0 - 35 U/L 91(H) 121(H) 179(H)      Lab Results  Component Value Date   WBC 8.2 08/31/2014   HGB 11.7* 08/31/2014   HCT 36.7 08/31/2014   MCV 89.1 08/31/2014   PLT 30* 08/31/2014   NEUTROABS 8.7* 08/26/2014    ASSESSMENT AND PLAN: 1. Right breast cancer: S/P lumpectomy and received adjuvant chemo with AC x 4 and Abraxane but chemo discontinued since mid march for C.Diff infections and neutropenic sepsis. She has been admitted multiple times and for diarrhea and dehydration. Infact she was started  on three times a week fluids recently. Patient and I discussed that she will no longer receive chemo.  2. Elevated AST and ALT and BR: ? Due to CHF 3. Severe thrombocytopenia: DD Consumption vs decreased production. The decline of platelet counts has stabilized. We can watch and monitor  4. Cardiac arrythmias and CHF: Cardiology following  Patient lives alone and is very afraid of going home and she requests placement in NH until she gets back to her feet.

## 2014-08-31 NOTE — Progress Notes (Signed)
CARE MANAGEMENT NOTE 08/31/2014  Patient:  Cheyenne Riggs, Cheyenne Riggs   Account Number:  [id number]  Date Initiated:  08/27/2014  Documentation initiated by:  Karl Bales  Subjective/Objective Assessment:   pt admitted with cco weakness     Action/Plan:   from home   Anticipated DC Date:  09/03/2014   Anticipated DC Plan:  [place]         Choice offered to / List presented to:             Status of service:   Medicare Important Message given?   (If response is "NO", the following Medicare IM given date fields will be blank) Date Medicare IM given:   Medicare IM given by:   Date Additional Medicare IM given:   Additional Medicare IM given by:    Discharge Disposition:    Per UR Regulation:  Reviewed for med. necessity/level of care/duration of stay  If discussed at [place] of Stay Meetings, dates discussed:    Comments:  [id number] [place]ridge Dace, [place], [place] Chart Reviewed. Discharge needs at time of review: None present will follow for needs. Chart note for progression: patient transferred to sdu due to hypotension on [id number].  08/27/14 MMcGibboney, RN, BSN Chart reviewed.

## 2014-08-31 NOTE — Consult Note (Signed)
Reason for Consult: WCT originally seen 08/17/14 SVT with aberrancy.   RBBB on EKG post DCCV  Referring Physician:  Dr. Doyle Askew  PCP:  Gennette Pac, MD  Primary Cardiologist:Dr. Candice Camp Cheyenne Riggs is an 69 y.o. female.    Chief Complaint: admitted 08/26/14 for generalized weakness.    HPI:  69 year old female admitted 08/26/14 for weakness after taking ativan at home.  She has history of known breast cancer and currently undergoing Abraxane chemotherapy, follows with Dr. Lindi Adie, with chronic nausea since starting chemo, recent diagnosis of C. Diff and completed treatment with flagyl, discharged on April 6th, 2016 after being hospitalized for evaluation of wide complex tachycardia with cardioversion in ER to SR, elevated troponins to 8, treated with beta blocker, acute on chronic combined systolic and diastolic CHF (last 2 D ECHO EF 35%- from 08/18/14 down from 60% on 06/30/14).   The wide complex tachycardia was SVT with aberrancy.   Post cardioversion EKG with RBBB.  Her baseline EKG did not show RBBB.    On this admit with acute renal failure Cr. Up to 2.31,  Plt 68 (down from 129 on 08/23/14), severe transaminitis significantly above the last blood work on 4/10. CT abd with no clear acute finding to explain transaminitis. ( pt admitted to 1 bottle of tylenol pwr week or more for pain).   Volume overload and diuresesed.  Her CK was elevated and MB but relative index normal. and normal abd. CT GI saw and diagnosed with rhabdomyolysis.   On evening of 08/27/14 HR to 36, with stimulation HR up to 57, VS stable.  Metoprolol held.   On afternoon 08/28/14 +SVT with rate 160, BP dropped to 93/72.  Transferred to stepdown and amio drip added but is no longer on, monitoring LFTs. She since has had episodic SVT with prn metoprolol, and when metoprolol has not worked IV Dilt with addition of neo drip to maintain BP.   Currently off all drips and on metoprolol 25 mg BID.  Denies chest pain or  SOB.  EKG: 08/28/14 SVT rate 160 with inf st depression  EKG: 08/27/13 at 1901 SB at 30 with RBBB and Qtc 601 ms  Tele:  08/27/14  S brady at 34 Tele:  SVT some wide complex and some narrow 08/28/14 Tele:  5 beats NSVT TELE:  Today PSVT, sinus brady to 40s some s brady with non conducted PACs ? PAF  Previous EKG 08/18/14 SR with non specific ST abnormality Qtc 529 ms Previous EKG 08/17/14 ST or atrial tach with RBBB and LAFB  Qtc 564 ms Previous EKG 02/25/14  SR with non specific ST changes Qtc 483    Past Medical History  Diagnosis Date  . Anxiety   . Back pain   . H/O bladder problems   . Cholelithiasis   . Depression   . High blood pressure   . Hypercholesteremia   . Kidney stones   . Gum disease   . Complication of anesthesia     bp goes up  . Wears glasses   . Wears dentures     top  . Kidney stones   . Cancer   . Breast cancer     Past Surgical History  Procedure Laterality Date  . Colon biopsy    . Mouth surgery    . Spine surgery    . Colonoscopy    . Tubal ligation    . Lithotripsy    .  Rhinoplasty    . Back surgery  1989    lumb lam  . Breast biopsy Right 02/05/2014    invasive ductal cncer  . Abdominal hysterectomy  1982  . Cholecystectomy  2011    lap choli  . Radioactive seed guided mastectomy with axillary sentinel lymph node biopsy Right 02/27/2014    Procedure: RADIOACTIVE SEED GUIDED RIGHT PARTIAL MASTECTOMY WITH AXILLARY SENTINEL LYMPH NODE BIOPSY;  Surgeon: Stark Klein, MD;  Location: [facility];  Service: General;  Laterality: Right;  . Portacath placement N/A 02/27/2014    Procedure: INSERTION PORT-A-CATH;  Surgeon: Stark Klein, MD;  Location: [facility];  Service: General;  Laterality: N/A;    Family History  Problem Relation Age of Onset  . Stroke Mother     onset in her 71's  . Cancer - Other Father     eye cancer, black lung diseae  . Heart failure Sister   . Hypertension Brother    Social History:   reports that she quit smoking about 3 months ago. She does not have any smokeless tobacco history on file. She reports that she does not drink alcohol or use illicit drugs.  Allergies:  Allergies  Allergen Reactions  . Ciprofloxacin Nausea And Vomiting  . Demerol [Meperidine] Hives  . Lidocaine     palpatations  . Other Swelling    Eye ointments  . Septra [Sulfamethoxazole-Trimethoprim] Nausea And Vomiting  . Codeine Rash  . Novocain [Procaine] Palpitations  . Vancomycin Rash    Scheduled Meds: . antiseptic oral rinse  7 mL Mouth Rinse q12n4p  . cefTRIAXone (ROCEPHIN)  IV  1 g Intravenous Q24H  . chlorhexidine  15 mL Mouth Rinse BID  . metoprolol tartrate  25 mg Oral BID  . sodium chloride  3 mL Intravenous Q12H   Continuous Infusions: . phenylephrine (NEO-SYNEPHRINE) Adult infusion Stopped (08/30/14 0439)   PRN Meds:.metoprolol, morphine injection, ondansetron **OR** ondansetron (ZOFRAN) IV     Results for orders placed or performed during the hospital encounter of 08/26/14 (from the past 48 hour(s))  Comprehensive metabolic panel     Status: Abnormal   Collection Time: 08/30/14  5:15 AM  Result Value Ref Range   Sodium 138 135 - 145 mmol/L   Potassium 3.8 3.5 - 5.1 mmol/L   Chloride 103 96 - 112 mmol/L   CO2 28 19 - 32 mmol/L   Glucose, Bld 139 (H) 70 - 99 mg/dL   BUN 37 (H) 6 - 23 mg/dL   Creatinine, Ser 2.15 (H) 0.50 - 1.10 mg/dL   Calcium 8.3 (L) 8.4 - 10.5 mg/dL   Total Protein 5.0 (L) 6.0 - 8.3 g/dL   Albumin 2.6 (L) 3.5 - 5.2 g/dL   AST 238 (H) 0 - 37 U/L   ALT 121 (H) 0 - 35 U/L   Alkaline Phosphatase 234 (H) 39 - 117 U/L   Total Bilirubin 2.1 (H) 0.3 - 1.2 mg/dL   GFR calc non Af Amer 22 (L) >90 mL/min   GFR calc Af Amer 26 (L) >90 mL/min    Comment: (NOTE) The eGFR has been calculated using the CKD EPI equation. This calculation has not been validated in all clinical situations. eGFR's persistently <90 mL/min signify possible Chronic  Kidney Disease.    Anion gap 7 5 - 15  CBC     Status: Abnormal   Collection Time: 08/30/14  5:15 AM  Result Value Ref Range   WBC 9.9 4.0 - 10.5 K/uL  Comment: ADJUSTED FOR NUCLEATED RBC'S   RBC 4.21 3.87 - 5.11 MIL/uL   Hemoglobin 12.0 12.0 - 15.0 g/dL   HCT 37.1 36.0 - 46.0 %   MCV 88.1 78.0 - 100.0 fL   MCH 28.5 26.0 - 34.0 pg   MCHC 32.3 30.0 - 36.0 g/dL   RDW 20.1 (H) 11.5 - 15.5 %   Platelets 29 (LL) 150 - 400 K/uL    Comment: REPEATED TO VERIFY SPECIMEN CHECKED FOR CLOTS PLATELET COUNT CONFIRMED BY SMEAR CRITICAL RESULT CALLED TO, READ BACK BY AND VERIFIED WITH: SARA BETH EARLY,RN [id number] @ 8466 BY J SCOTTON   CK     Status: Abnormal   Collection Time: 08/30/14  5:15 AM  Result Value Ref Range   Total CK 1226 (H) 7 - 177 U/L  Ammonia     Status: None   Collection Time: 08/30/14  5:15 AM  Result Value Ref Range   Ammonia 26 11 - 32 umol/L  Heparin induced thrombocytopenia pnl     Status: None   Collection Time: 08/30/14  1:30 PM  Result Value Ref Range   Heparin Induced Plt Ab 0.170 0.000 - 0.400 OD    Comment: (NOTE) Performed At: [facility] [place], [place] [id number] Lindon Romp MD BL:[id number]   CBC     Status: Abnormal   Collection Time: 08/31/14  5:40 AM  Result Value Ref Range   WBC 8.2 4.0 - 10.5 K/uL   RBC 4.12 3.87 - 5.11 MIL/uL   Hemoglobin 11.7 (L) 12.0 - 15.0 g/dL   HCT 36.7 36.0 - 46.0 %   MCV 89.1 78.0 - 100.0 fL   MCH 28.4 26.0 - 34.0 pg   MCHC 31.9 30.0 - 36.0 g/dL   RDW 20.6 (H) 11.5 - 15.5 %   Platelets 30 (L) 150 - 400 K/uL    Comment: CONSISTENT WITH PREVIOUS RESULT  Comprehensive metabolic panel     Status: Abnormal   Collection Time: 08/31/14  5:40 AM  Result Value Ref Range   Sodium 134 (L) 135 - 145 mmol/L   Potassium 3.6 3.5 - 5.1 mmol/L   Chloride 97 96 - 112 mmol/L   CO2 29 19 - 32 mmol/L   Glucose, Bld 141 (H) 70 - 99 mg/dL   BUN 32 (H) 6 - 23 mg/dL   Creatinine, Ser 1.59 (H) 0.50 - 1.10  mg/dL   Calcium 7.9 (L) 8.4 - 10.5 mg/dL   Total Protein 4.9 (L) 6.0 - 8.3 g/dL   Albumin 2.5 (L) 3.5 - 5.2 g/dL   AST 150 (H) 0 - 37 U/L   ALT 91 (H) 0 - 35 U/L   Alkaline Phosphatase 206 (H) 39 - 117 U/L   Total Bilirubin 1.9 (H) 0.3 - 1.2 mg/dL   GFR calc non Af Amer 32 (L) >90 mL/min   GFR calc Af Amer 37 (L) >90 mL/min    Comment: (NOTE) The eGFR has been calculated using the CKD EPI equation. This calculation has not been validated in all clinical situations. eGFR's persistently <90 mL/min signify possible Chronic Kidney Disease.    Anion gap 8 5 - 15  CK     Status: Abnormal   Collection Time: 08/31/14  5:40 AM  Result Value Ref Range   Total CK 918 (H) 7 - 177 U/L    Hepatic Function Latest Ref Rng 08/31/2014 08/30/2014 08/29/2014  Total Protein 6.0 - 8.3 g/dL 4.9(L) 5.0(L) 4.9(L)  Albumin 3.5 -  5.2 g/dL 2.5(L) 2.6(L) 2.7(L)  AST 0 - 37 U/L 150(H) 238(H) 529(H)  ALT 0 - 35 U/L 91(H) 121(H) 179(H)  Alk Phosphatase 39 - 117 U/L 206(H) 234(H) 275(H)  Total Bilirubin 0.3 - 1.2 mg/dL 1.9(H) 2.1(H) 2.8(H)   LAST ECHO : 08/18/14 Study Conclusions - Left ventricle: The cavity size was normal. Wall thickness was normal. Systolic function was moderately reduced. The estimated ejection fraction was 35%. Diffuse hypokinesis. - Aortic valve: Mildly thickened, mildly calcified leaflets. There was mild to moderate regurgitation. - Mitral valve: There was mild to moderate regurgitation. - Atrial septum: No defect or patent foramen ovale was identified. - Pulmonic valve: There was mild regurgitation.  LIVER ultrasound: IMPRESSION: 1. Main portal vein is patent with normal hepatopetal flow. Intrahepatic portal veins are difficult image secondary to a combination of hepatic steatosis and patient body habitus. Given the unremarkable appearance of the main portal vein, significant intrahepatic portal venous thrombosis is considered somewhat unlikely. 2. Hepatic steatosis. 3.  Hepatic veins remain patent. 4. Right larger than left pleural effusions   ROS: General:no colds or fevers, + PNA in Jan of this year.  weight increase of 30 lbs since admit Skin:no rashes or ulcers, + ecchymosis of arms and feet   HEENT:no blurred vision, no congestion CV:see HPI PUL:see HPI GI:no diarrhea now- c. Dif in Jan after treatment for PNA constipation or melena, no indigestion GU:no hematuria, no dysuria MS:no joint pain, no claudication, weakness  Neuro:no syncope, no lightheadedness Endo:no diabetes, no thyroid disease   Blood pressure 143/69, pulse 63, temperature 97.4 F (36.3 C), temperature source Oral, resp. rate 24, height $RemoveBe'5\' 3"'TEmJrokaG$  (1.6 m), weight 193 lb 2 oz (87.6 kg), SpO2 96 %.  Wt Readings from Last 3 Encounters:  08/31/14 193 lb 2 oz (87.6 kg)  08/25/14 168 lb 4 oz (76.318 kg)  08/19/14 168 lb 4.8 oz (76.34 kg)    PE: General:Pleasant affect, NAD, appears weak Skin:Warm and dry, brisk capillary refill HEENT:normocephalic, sclera clear, mucus membranes moist Neck:supple, no JVD, no bruits on Rt has IV on Lt Heart:S1S2 RRR without murmur, [place], rub or click slow at times and fast at times Lungs:clear without rales, rhonchi, or wheezes WJX:BJYNW, soft, non tender, + BS, do not palpate liver spleen or masses Ext:+ lower ext edema, 2+ pedal pulses, 2+ radial pulses Neuro:alert and oriented X 3, MAE, follows commands, + facial symmetry    Assessment/Plan  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  Dr. Caryl Comes to see  (TSH was normal 2 weeks ago 1.792)  Some of tachycardia could be a fib- somewhat irregular.  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 MI  ( previous admits in Feb and March for diastolic HF with elevated troponin at 0.24 and then 0.75)  --Also with SVT that admit and thought was demand ischemia.  ? Ischemia driving tachycardia.  With IV fluids for acute renal failure, rhabdomyolysis she is now +10,852   Acute transaminitis secondary to drug induced hepatotoxicity, ? Ischemic hepatitis improving, thought may be due to tylenol  Acute renal failure- Cr improving after receiving IV fluids She is +  10,852  Cr. 3.18-->3.05-->2.87--> 2.15--> 1.59 ( was not on ACE or ARB prior to admit due to BP an dCr.)  Thrombocytopenia -Plts now 30 at discharge 08/18/14 were 672   Metabolic acidosis - in the setting of acute on chronic renal failure and rhabdomyolysis and progression to ATN - resolved with Bicarb, off bicarb since 4/17  Rhabdomyolysis - Patient responding well to IV fluids, CK level continue trending down   Acute urinary retention - placed foley  - renal US with no signs of hydronephrosis  Breast cancer -  Dr. Lindi Adie following  - pt wants to stop chemotherapy, to think about PCT   DVT prophylaxis - was on heparin SQ, for 48 hours while in SDU but will transition to SCD's  Recent C Diff resolved   Active Problems:   Renal failure   Transaminitis   Elevated liver function tests   Weakness    [facility] R  Nurse Practitioner Certified [place] Pager [phone] or after 5pm or weekends call [phone] 08/31/2014, 3:37 PM     Personally seen and examined. Agree with above. Discussed with patient. First off, she states that she is asymptomatic with her episodes of tachycardia/bradycardia. She does endorse that she is somewhat short of breath at home when going up stairs. I reviewed telemetry monitoring, occasional beats labeled as nonsustained ventricular tachycardia certainly could be aberrantly conducted SVT however with ejection fraction of 35% could be ventricular  tachycardia as well as she also has what looks like short bursts of atrial fibrillation. Paroxysmal atrial tachycardia is also present. All in all, atriopathy is present.  Challenging situation given her underlying medical condition, malignancy. I agree with not using amiodarone in this situation given her previously elevated liver function studies. Metoprolol is the best choice in this situation given her cardiomyopathy, reduced ejection fraction of 35%. Thus far, blood pressure seemed to be able to tolerate 25 mg twice a day of metoprolol. With this dosage of medication however she does occasionally have bradycardia in the 40s. I would not increase this dosage at this time. We could continue with when necessary IV boluses of metoprolol for instance, 2.5 mg or 5 mg as tolerated by blood pressure.  Globally, she states that "she is not ready to die ". Creatinine is improving 2.8 down to 1.5.  With troponin of 8, she has had a non-ST elevation myocardial infarction likely secondary to underlying tachycardia in the setting of hypotension/demand ischemia. Continue at this point with medical management, beta blocker. She would not be a cardiac catheterization candidate.  We will continue to try her best to help maintain a stable of a rhythm is possible.   Candee Furbish, MD

## 2014-08-31 NOTE — Plan of Care (Signed)
Problem: Phase I Progression Outcomes Goal: Up in chair Outcome: Progressing PT at bedside to mobilize pt.  Pt sat on side of bed, but did not feel up to sitting in chair.  Pt. napping intermittently throughout shift, saying that she is feeling better than the past couple days, tough still remains fatigued. Goal: Initial discharge plan identified Outcome: Progressing Son is involved with future facility placement for safety and nursing care.  Problem: Phase II Progression Outcomes Goal: Tolerating diet Outcome: Progressing Ate one meal today and sipped ginger ale the rest of the day.  Declined offer for more food. Goal: Dyspnea controlled with activity Outcome: Completed/Met Date Met:  08/31/14 Pt remains on room air, no dyspnea noted.  Problem: Phase III Progression Outcomes Goal: Hemodynamically stable Outcome: Progressing Toes noted to appear dusky and cool to touch.  Both pedal pulses are present.  Problem: Discharge Progression Outcomes Goal: Barriers To Progression Addressed/Resolved Outcome: Progressing Pt noted to have several different Cardiac rhythms during shift: NSR, Afib, SVT and Sinus Bradycardia.  All Arrhythmia's resolving after short bursts.  Cardiology to see patient for further evaluation. Goal: Activity appropriate for discharge plan Outcome: Progressing Pt refuses SCD's, as her legs hurt MD aware.  Platelets remain low.

## 2014-08-31 NOTE — Clinical Documentation Improvement (Signed)
Supporting Information: Sudden hypotension with SVT, transferred to step down unit, central line placed, required pressors and amiodarone drip per 4/15 progress notes.   Labs: Lactic acid: 4/15:  3.6. 4/16:  2.6.   Possible Clinical Condition: . Bacteremia (positive blood cultures only) . Sepsis with UTI, versus UTI only . Sepsis-specify causative organism if known . Sepsis due to: --Device --Implant --Graft --Infusion . Severe sepsis-sepsis with organ dysfunction --Specify organ dysfunction Respiratory failure Encephalopathy Acute kidney failure Other (specify) . SIRS (Systemic Inflammatory Response Syndrome --With or without organ dysfunction . Document septic shock if present . Document any associated diagnoses/conditions    Thank Sherian Maroon Documentation Specialist [phone] [email or web address]

## 2014-09-01 DIAGNOSIS — I480 Paroxysmal atrial fibrillation: Secondary | ICD-10-CM

## 2014-09-01 LAB — CBC
HEMATOCRIT: 37.1 % (ref 36.0–46.0)
Hemoglobin: 11.8 g/dL — ABNORMAL LOW (ref 12.0–15.0)
MCH: 28.4 pg (ref 26.0–34.0)
MCHC: 31.8 g/dL (ref 30.0–36.0)
MCV: 89.4 fL (ref 78.0–100.0)
Platelets: 30 10*3/uL — ABNORMAL LOW (ref 150–400)
RBC: 4.15 MIL/uL (ref 3.87–5.11)
RDW: 20.9 % — ABNORMAL HIGH (ref 11.5–15.5)
WBC: 8 10*3/uL (ref 4.0–10.5)

## 2014-09-01 LAB — BASIC METABOLIC PANEL
Anion gap: 9 (ref 5–15)
BUN: 30 mg/dL — ABNORMAL HIGH (ref 6–23)
CO2: 31 mmol/L (ref 19–32)
Calcium: 8.5 mg/dL (ref 8.4–10.5)
Chloride: 97 mmol/L (ref 96–112)
Creatinine, Ser: 1.37 mg/dL — ABNORMAL HIGH (ref 0.50–1.10)
GFR calc Af Amer: 45 mL/min — ABNORMAL LOW (ref >90)
GFR, EST NON AFRICAN AMERICAN: 39 mL/min — AB (ref >90)
GLUCOSE: 107 mg/dL — AB (ref 70–99)
Potassium: 4.1 mmol/L (ref 3.5–5.1)
Sodium: 137 mmol/L (ref 135–145)

## 2014-09-01 LAB — COMPREHENSIVE METABOLIC PANEL
ALK PHOS: 223 U/L — AB (ref 39–117)
ALT: 75 U/L — ABNORMAL HIGH (ref 0–35)
AST: 119 U/L — ABNORMAL HIGH (ref 0–37)
Albumin: 2.7 g/dL — ABNORMAL LOW (ref 3.5–5.2)
Anion gap: 8 (ref 5–15)
BUN: 30 mg/dL — AB (ref 6–23)
CO2: 32 mmol/L (ref 19–32)
Calcium: 8.7 mg/dL (ref 8.4–10.5)
Chloride: 97 mmol/L (ref 96–112)
Creatinine, Ser: 1.43 mg/dL — ABNORMAL HIGH (ref 0.50–1.10)
GFR, EST AFRICAN AMERICAN: 43 mL/min — AB (ref >90)
GFR, EST NON AFRICAN AMERICAN: 37 mL/min — AB (ref >90)
GLUCOSE: 104 mg/dL — AB (ref 70–99)
Potassium: 4.2 mmol/L (ref 3.5–5.1)
SODIUM: 137 mmol/L (ref 135–145)
Total Bilirubin: 2 mg/dL — ABNORMAL HIGH (ref 0.3–1.2)
Total Protein: 5.2 g/dL — ABNORMAL LOW (ref 6.0–8.3)

## 2014-09-01 LAB — CK: Total CK: 658 U/L — ABNORMAL HIGH (ref 7–177)

## 2014-09-01 MED ORDER — ZOLPIDEM TARTRATE 5 MG PO TABS
5.0000 mg | ORAL_TABLET | Freq: Every evening | ORAL | Status: DC | PRN
  Filled 2014-09-01: qty 1

## 2014-09-01 MED ORDER — DIPHENHYDRAMINE HCL 25 MG PO CAPS
25.0000 mg | ORAL_CAPSULE | Freq: Every evening | ORAL | Status: DC | PRN
  Administered 2014-09-01: 25 mg via ORAL
  Filled 2014-09-01: qty 1

## 2014-09-01 MED ORDER — ALTEPLASE 2 MG IJ SOLR
2.0000 mg | Freq: Once | INTRAMUSCULAR | Status: AC
  Administered 2014-09-01: 2 mg
  Filled 2014-09-01: qty 2

## 2014-09-01 MED ORDER — DIPHENHYDRAMINE HCL 25 MG PO CAPS: 25.0000 mg | ORAL_CAPSULE | Freq: Every evening | ORAL | Status: DC | PRN

## 2014-09-01 MED ORDER — FUROSEMIDE 40 MG PO TABS: 40.0000 mg | ORAL_TABLET | ORAL | Status: DC | PRN

## 2014-09-01 MED ORDER — FUROSEMIDE 40 MG PO TABS
40.0000 mg | ORAL_TABLET | Freq: Every day | ORAL | Status: DC
  Administered 2014-09-01 – 2014-09-04 (×4): 40 mg via ORAL
  Filled 2014-09-01 (×4): qty 1

## 2014-09-01 MED ORDER — MORPHINE SULFATE 2 MG/ML IJ SOLN
1.0000 mg | INTRAMUSCULAR | Status: DC | PRN
  Administered 2014-09-01 – 2014-09-04 (×8): 1 mg via INTRAVENOUS
  Filled 2014-09-01 (×7): qty 1

## 2014-09-01 MED ORDER — ZOLPIDEM TARTRATE 5 MG PO TABS: 5.0000 mg | ORAL_TABLET | Freq: Every evening | ORAL | Status: DC | PRN

## 2014-09-01 NOTE — Progress Notes (Addendum)
Patient ID: Cheyenne Riggs, female   DOB: [birth year], 69 y.o.   MRN: [id number]  [facility] PROGRESS NOTE  TYRONE BALASH CHE:[id number] DOB: 04/29/1946 DOA: 08/26/2014 PCP: Gennette Pac, MD   Brief narrative:    Pt is 69 yo female with known breast cancer and currently undergoing Abraxane chemotherapy, follows with Dr. Lindi Adie, with chronic nausea since starting chemo, recent diagnosis of C. Diff and completed treatment with flagyl, discharged on April 6th, 2016 after being hospitalized for evaluation of wide complex tachycardia, elevated troponins, treated with beta blocker, acute on chronic combined systolic and diastolic CHF (last 2 D ECHO EF 35%) presented to [facility] ED with main concern of generalized weakness after taking dose of Ativan at home. Please note that pt is rather sleepy on exam and vary slow to respond and therefore difficult to provide history. She denies chest pain or shortness of breath, no specific abd or urinary concerns.   In ED, pt noted to be hemodynamically stable, VSS, blood work notable for Cr 2.31 (up from recent value 4/10 Cr 1.13), Plt 68 (down from 129 on 08/23/14), severe transaminitis significantly above the last blood work on 4/10. CT abd with no clear acute finding to explain transaminitis.   Hospital course complicated by development of hypotension with tachy brady events requiring transfer to ICU on 4/15. Due to requirement for IVF for treatment of ARF and rhabdomyolysis, acute drug induces hepatitis, pt became more volume overloaded and weight trended up from 175 lbs to 198 lbs on 4/19. Cardiology has been consulted for assistance with tachy brady syndrome and paroxysmal a-fib. Oncology consulted for assistance with thrombocytopenia. Pt now very clear she does not want any more chemotx. PCT has been consulted.   Major events since admission: 4/15 - sudden hypotension with SVT requiring transfer to stepdown unit, central line placed, required pressors,  amiodarone drip 4/16 - blood pressure stable, off pressors, LFTs and renal function improving 4/17 - intermittent episodes of SVT's wide complex, pt asymptomatic, Plt count dropping and oncology consulted, stopped amiodarone  4/18 - still with intermittent SVT's, right foot toe ischemic, pt with no chest pain this AM, cardiology consulted  4/19 - better this AM, still very weak, agreeable to PCT consultation   Assessment/Plan:    Active Problems: Acute transaminitis secondary to drug induced hepatotoxicity, ? Ischemic hepatitis  - CT abd with specific acute finding to explain transaminitis  - thought to be secondary to drug induced hepatotoxicity and rhabdomyolysis per GI doctor Dr. Watt Climes  - unlikely related to CHF as LFT got worse on Lasix and finally started to trend down with IVF c/w acute hepatotoxicity, drug induced  - Please note that patient initially denied taking Tylenol more than prescribed - upon further questioning 4/15, patient admitted to taking double to triple doses of tylenol for back pain and headaches - please note that Dr. Watt Climes recommended IVF and supportive care avoiding hepatotoxic medications  - Liver tests still elevated but continue improving and trending down  - Tylenol level remains undetectable - Plan on repeating liver function tests in the morning - avoiding hepatotoxic medications   Acute on chronic renal failure stage III - last known Cr 1.13 on last admission, creatinine overall trending down but slightly up in the past 24 hours  - with elevated CK level on admission, this was thought to be secondary to rhabdomyolysis, progression from prerenal etiology to ATN, ? CIN (pt has received contrast for recent CT abd) - Systolic CHF with EF  35% precluded use of aggressive IV fluid hydration but pt has been receiving IVF NS at 75 cc/hr until 4/18 - she was 175 lbs on admission and now up to 198 lbs this AM (some difference could come from different calibration  scales on tele bed and SDU) - Renal ultrasound 08/28/2014 with atrophic left kidney, unremarkable right kidney, no signs of hydronephrosis - Continue to monitor intake and output - Repeat BMP in the morning  Right foot toes mottling  - pulses palpable but faint, monitor closely  SVT with hypotension, 08/28/2014 - Unclear of triggering event, required transfer to stepdown unit 4/15, with intermittent tachy - bradycardia episodes  - pt currently off pressors since 4/16 but with intermittent episodes of SVTs and paroxysmal a-fib overnight 4/16, 4/17, 4/18  - pt started on amiodarone drip 4/15 but given its hepatotoxicity, pt quickly tapered off amio  - continue metoprolol per home medical regimen 25 mg by mouth twice a day and add metoprolol as needed  - cardiology team has been consulted, recommendation is to continue same medical regimen with scheduled metoprolol with IV pushes as needed   Metabolic acidosis - in the setting of acute on chronic renal failure and rhabdomyolysis and progression to ATN - resolved with Bicarb, off bicarb since 4/17 - repeat BMP in AM  Rhabdomyolysis - Patient responded well to IV fluids, CK level continue trending down but now volume overloaded and weight up from 175 --> 198 lbs  - off IVF since 4/18  - repeat CK level in AM  Acute on chronic respiratory failure secondary to acute on chronic systolic and diastolic CHF, with hypoxia (O2 sat 89% on RA) - last 2 D ECHO with EF 35%, known and persistent right side pleural effusion  - weight on admission 175 lbs --> 198 lbs this AM, from IVF pt has required for tx of rhabdo, ARF, acute hepatitis as noted above  - resume lasix per home medical regimen 4/19 - close monitoring of volume status  - monitor daily weights, I's and O's - no ACEI in the setting or renal failure  - keep on Shepherd`  Leukocytosis secondary to UTI 4/14 - continue Rocephin day #6/7 - WBC is now WNL - urine culture with multiple morphotypes  present and recollection recommended but likley will be of no yield as pt already on Rocephin   Acute urinary retention on admission  - placed foley  - renal US with no signs of hydronephrosis  Essential HTN - stable this AM off pressors since 4/16  Breast cancer - notified Dr. Lindi Adie of pt's admission  - appreciate his assistance - he is aware that pt is no longer wanting chemo   Severe PCM - in the context of chronic illness - pt still with poor oral intake   Thrombocytopenia - unclear etiology, plt holding stable for now at 30 K with no need for transfusion unless < 20 K or with active bleeding  - appreciate oncologist recommendations  - avoiding all heparin products as recommended by oncologist  - repeat CBC in AM  DVT prophylaxis - was on heparin SQ, for 48 hours while in SDU but was transitioned to SCD's on 4/17 due to worsening thrombocytopenia   Code Status: Full.  Family Communication:  plan of care discussed with the patient Disposition Plan: Keep in SDU today and possible transfer to telemetry bed in AM, will need SNF upon discharge. Very complicated case as pt still with volume overload and ARF, transaminitis. Will  likely need to remain hospitalized until back to dry weight and liver/renal failure resolved.  Time spent 25 minutes   IV access:  Peripheral IV, IJ CVC  Procedures and diagnostic studies:    Ct Abdomen Pelvis Wo Contrast  08/26/2014   Large pleural effusions, right larger than left. Dependent pulmonary atelectasis.  Previous cholecystectomy and hysterectomy.  Left renal atrophy with nonobstructing renal stones in the lower pole.  Atherosclerosis of the aorta and its branch vessels.  Diverticulosis. Small amount of free fluid in the pelvis. Diverticulitis is not confidently demonstrated, though low level diverticulitis could be in apparent by imaging.     Dg Chest 2 View  08/26/2014   Resolving diffuse interstitial infiltrates. Stable cardiomegaly and  persistent small layering bilateral pleural effusions.     US Renal  08/28/2014  Comparatively atrophic left kidney with lower pole calculi. Appearance is commensurate with recent CT. There is no mass or hydronephrosis on either side. No perinephric fluid collections are appreciable. Right kidney appears normal by ultrasound.     [place] Art/ven Flow Abd Pelv Doppler  08/29/2014  Main portal vein is patent with normal hepatopetal flow. Intrahepatic portal veins are difficult image secondary to a combination of hepatic steatosis and patient body habitus. Given the unremarkable appearance of the main portal vein, significant intrahepatic portal venous thrombosis is considered somewhat unlikely. 2. Hepatic steatosis. 3. Hepatic veins remain patent. 4. Right larger than left pleural effusions.     Dg Chest Port 1 View  08/28/2014   Tip of the left internal jugular central venous catheter in the SVC. No pneumothorax.     Medical Consultants:  PCCM - signed off 4/16 Oncology  Cardiology  Other Consultants:  None   IAnti-Infectives:   Rocephin 4/14 --> 4/20  Faye Ramsay, MD  [facility] Pager [phone]  If 7PM-7AM, please contact night-coverage [email or web address] Password [email or web address] 09/01/2014, 2:06 PM   LOS: 6 days   HPI/Subjective: Pt with back pain and headache. SVT episodes overnight. No chest pain or shortness of breath, reports persistent generalized weakness worse in bilateral LE.    Objective: Filed Vitals:   09/01/14 0907 09/01/14 0910 09/01/14 0926 09/01/14 1200  BP:   141/58 142/66  Pulse: 73  51 68  Temp:    97.7 F (36.5 C)  TempSrc:    Axillary  Resp: 14 3 15 15   Height:      Weight:      SpO2: 98%  96% 94%    Intake/Output Summary (Last 24 hours) at 09/01/14 1406 Last data filed at 09/01/14 0909  Gross per 24 hour  Intake    420 ml  Output      0 ml  Net    420 ml    Exam:   General:  Pt is alert, follows commands appropriately, not in acute distress, frail and weak    Cardiovascular: Regular rhythm, rate, S1/S2, no rubs, no gallops  Respiratory: No wheezing, diminished breath sounds on the right side with crackles at bases   Abdomen: Soft, non tender, non distended, bowel sounds present, no guarding  Extremities: pulses DP and PT palpable bilaterally, right foot toes mottling   Neuro: bilateral LE weakness, strength 3/5, sensation intact in upper and lower extremities bilaterally  Data Reviewed: Basic Metabolic Panel:  Recent Labs Lab 08/26/14 1945  08/29/14 0450 08/30/14 0515 08/31/14 0540 09/01/14 0400 09/01/14 0910  NA 138  < > 136  137 138 134* 137 137  K 4.5  < > 4.4  4.3 3.8 3.6 4.1 4.2  CL 106  < > 105  105 103 97 97 97  CO2 18*  < > 21  20 28 29 31  32  GLUCOSE 85  < > 164*  163* 139* 141* 107* 104*  BUN 17  < > 39*  41* 37* 32* 30* 30*  CREATININE 2.32*  < > 2.80*  2.87* 2.15* 1.59* 1.37* 1.43*  CALCIUM 9.0  < > 8.5  8.6 8.3* 7.9* 8.5 8.7  MG 1.8  --   --   --   --   --   --   PHOS 6.9*  --  5.0*  --   --   --   --   < > = values in this interval not displayed. Liver Function Tests:  Recent Labs Lab 08/28/14 0450 08/29/14 0450 08/30/14 0515 08/31/14 0540 09/01/14 0910  AST 1153* 529* 238* 150* 119*  ALT 242* 179* 121* 91* 75*  ALKPHOS 299* 275* 234* 206* 223*  BILITOT 2.8* 2.8* 2.1* 1.9* 2.0*  PROT 5.2* 4.9* 5.0* 4.9* 5.2*  ALBUMIN 2.8* 2.7*  2.7* 2.6* 2.5* 2.7*    Recent Labs Lab 08/26/14 1226  LIPASE 16   CBC:  Recent Labs Lab 08/26/14 1213 08/27/14 0550 08/28/14 0450 08/30/14 0515 08/31/14 0540 09/01/14 0400  WBC 10.6* 13.5* 12.2* 9.9 8.2 8.0  NEUTROABS 8.7*  --   --   --   --   --   HGB 12.1 12.3 12.7 12.0 11.7* 11.8*  HCT 37.5 39.3 40.5 37.1 36.7 37.1  MCV 87.4 88.7 89.0 88.1 89.1 89.4  PLT 68* 57* 45* 29* 30* 30*   Cardiac Enzymes:  Recent Labs Lab 08/28/14 0450 08/29/14 0450 08/30/14 0515 08/31/14 0540 09/01/14 0910  CKTOTAL 3297* 2364* 1226* 918* 658*  CKMB 49.6*  --   --    --   --    . antiseptic oral rinse  7 mL Mouth Rinse q12n4p  . cefTRIAXone (ROCEPHIN)  IV  1 g Intravenous Q24H  . chlorhexidine  15 mL Mouth Rinse BID  . metoprolol tartrate  25 mg Oral BID  . sodium chloride  3 mL Intravenous Q12H    . phenylephrine (NEO-SYNEPHRINE) Adult infusion Stopped (08/30/14 0439)

## 2014-09-01 NOTE — Progress Notes (Signed)
CSW continuing to follow.  CSW met with pt at bedside to follow up re: SNF bed offers.   CSW provided pt with updated SNF bed offers. Pt expressed that the two facilities she was interested in finding out more about are [facility] and [facility]. Pt provided permission for CSW to contact pt son, [name] via telephone to update pt son on bed offers in order for pt son to assist pt in making a decision.   CSW contacted pt son, [name] via telephone. CSW updated pt son regarding SNF bed offers. Pt son discussed that pt had discussed with him interest in [place] and [place] notified pt son that pt did express interest in [facility] and [facility]. Pt son states that he will plan to discuss further with pt in order to assist pt with decision regarding placement.   CSW to continue to follow to provide support and assist with pt disposition needs.   Alison Murray, MSW, [place] [facility] [phone]

## 2014-09-01 NOTE — Progress Notes (Signed)
Patient Name: Cheyenne Riggs Date of Encounter: 09/01/2014     Active Problems:   Renal failure   Acute renal failure syndrome   Transaminitis   Elevated liver function tests   Weakness    SUBJECTIVE  The patient feels better today.  She is hoping to be able to ambulate today.  No chest pain or shortness of breath.  She had intermittent atrial fibrillation during the night which was well-tolerated.  CURRENT MEDS . antiseptic oral rinse  7 mL Mouth Rinse q12n4p  . cefTRIAXone (ROCEPHIN)  IV  1 g Intravenous Q24H  . chlorhexidine  15 mL Mouth Rinse BID  . metoprolol tartrate  25 mg Oral BID  . sodium chloride  3 mL Intravenous Q12H    OBJECTIVE  Filed Vitals:   09/01/14 0300 09/01/14 0400 09/01/14 0500 09/01/14 0600  BP:  146/83  163/76  Pulse: 60  65 71  Temp:  97.4 F (36.3 C)    TempSrc:  Oral    Resp: 10 18 20 16   Height:      Weight:   198 lb 13.7 oz (90.2 kg)   SpO2: 98%  91% 97%    Intake/Output Summary (Last 24 hours) at 09/01/14 0711 Last data filed at 09/01/14 0200  Gross per 24 hour  Intake   1105 ml  Output      0 ml  Net   1105 ml   Filed Weights   08/30/14 0444 08/31/14 0300 09/01/14 0500  Weight: 182 lb 1.6 oz (82.6 kg) 193 lb 2 oz (87.6 kg) 198 lb 13.7 oz (90.2 kg)    PHYSICAL EXAM  General: Pleasant, NAD. Neuro: Alert and oriented X 3. Moves all extremities spontaneously. Psych: Normal affect. HEENT:  Normal  Neck: Supple without bruits or JVD. Lungs:  Resp regular and unlabored, CTA. Heart: RRR no s3, s4, or murmurs. Abdomen: Soft, non-tender, non-distended, BS + x 4.  Extremities: She has mottling of her toes.  Weak pedal pulses.  No pitting edema.  Accessory Clinical Findings  CBC  Recent Labs  08/31/14 0540 09/01/14 0400  WBC 8.2 8.0  HGB 11.7* 11.8*  HCT 36.7 37.1  MCV 89.1 89.4  PLT 30* 30*   Basic Metabolic Panel  Recent Labs  08/31/14 0540 09/01/14 0400  NA 134* 137  K 3.6 4.1  CL 97 97  CO2 29 31  GLUCOSE  141* 107*  BUN 32* 30*  CREATININE 1.59* 1.37*  CALCIUM 7.9* 8.5   Liver Function Tests  Recent Labs  08/30/14 0515 08/31/14 0540  AST 238* 150*  ALT 121* 91*  ALKPHOS 234* 206*  BILITOT 2.1* 1.9*  PROT 5.0* 4.9*  ALBUMIN 2.6* 2.5*   No results for input(s): LIPASE, AMYLASE in the last 72 hours. Cardiac Enzymes  Recent Labs  08/30/14 0515 08/31/14 0540  CKTOTAL 1226* 918*   BNP Invalid input(s): POCBNP D-Dimer No results for input(s): DDIMER in the last 72 hours. Hemoglobin A1C No results for input(s): HGBA1C in the last 72 hours. Fasting Lipid Panel No results for input(s): CHOL, HDL, LDLCALC, TRIG, CHOLHDL, LDLDIRECT in the last 72 hours. Thyroid Function Tests No results for input(s): TSH, T4TOTAL, T3FREE, THYROIDAB in the last 72 hours.  Invalid input(s): FREET3  TELE  Currently normal sinus rhythm  ECG    Radiology/Studies  Ct Abdomen Pelvis Wo Contrast  08/26/2014   CLINICAL DATA:  Weakness and dizziness.  EXAM: CT ABDOMEN AND PELVIS WITHOUT CONTRAST  TECHNIQUE: Multidetector CT imaging  of the abdomen and pelvis was performed following the standard protocol without IV contrast.  COMPARISON:  09/22/2009  FINDINGS: There are large bilateral pleural effusions, right larger than left. These later dependently an or associated with dependent pulmonary atelectasis. The heart is enlarged. No significant pericardial fluid is seen.  The liver has a normal appearance without contrast. There has been previous cholecystectomy. The spleen is normal. The pancreas is normal. The adrenal glands show hypertrophic change. The right kidney is normal. The left kidney is atrophic and contains several stones in the lower pole. No sign of active obstruction. There is atherosclerosis of the aorta. The IVC is normal. No retroperitoneal mass or lymphadenopathy. No ascites. No bowel obstruction. Tiny amount of free fluid in the pelvis. There is diverticulosis of the sigmoid colon. Low  level diverticulitis could not be excluded but there is no pronounced diverticulitis. There has been previous hysterectomy. There chronic degenerative changes of the spine.  IMPRESSION: Large pleural effusions, right larger than left. Dependent pulmonary atelectasis.  Previous cholecystectomy and hysterectomy.  Left renal atrophy with nonobstructing renal stones in the lower pole.  Atherosclerosis of the aorta and its branch vessels.  Diverticulosis. Small amount of free fluid in the pelvis. Diverticulitis is not confidently demonstrated, though low level diverticulitis could be in apparent by imaging.   Electronically Signed   By: Nelson Chimes M.D.   On: 08/26/2014 16:17   Dg Chest 2 View  08/26/2014   CLINICAL DATA:  Weakness and dizziness. Currently undergoing chemotherapy for right upper outer quadrant breast carcinoma.  EXAM: CHEST  2 VIEW  COMPARISON:  08/17/2014  FINDINGS: Moderate cardiomegaly remains stable. Right-sided Port-A-Cath remains in appropriate position.  Decrease in diffuse interstitial infiltrates seen since previous study, consistent with decreased interstitial edema or pneumonitis. No evidence of pulmonary consolidation. Probable tiny layering bilateral pleural effusions noted on lateral projection.  IMPRESSION: Resolving diffuse interstitial infiltrates. Stable cardiomegaly and persistent small layering bilateral pleural effusions.   Electronically Signed   By: Earle Gell M.D.   On: 08/26/2014 13:42   US Renal  08/28/2014   CLINICAL DATA:  Acute renal failure.  EXAM: RENAL/URINARY TRACT ULTRASOUND COMPLETE  COMPARISON:  CT abdomen and pelvis August 26, 2014  FINDINGS: Right Kidney:  Length: 10.6 cm. Echogenicity and renal cortical thickness are within normal limits. No mass, perinephric fluid, or hydronephrosis visualized. No sonographically demonstrable calculus or ureterectasis.  Left Kidney:  Length: 7.8 cm. The left kidney is atrophic with areas of scarring. There are several lower  pole region calculi, largest measuring 9 mm. There is no demonstrable mass, hydronephrosis, or perinephric fluid. No ureterectasis.  Bladder:  Appears normal for degree of bladder distention.  IMPRESSION: Comparatively atrophic left kidney with lower pole calculi. Appearance is commensurate with recent CT. There is no mass or hydronephrosis on either side. No perinephric fluid collections are appreciable. Right kidney appears normal by ultrasound.   Electronically Signed   By: Lowella Grip III M.D.   On: 08/28/2014 16:19   [place] Art/ven Flow Abd Pelv Doppler  08/29/2014   CLINICAL DATA:  69 year old female with acute renal failure  EXAM: DUPLEX ULTRASOUND OF LIVER  TECHNIQUE: Color and duplex Doppler ultrasound was performed to evaluate the hepatic in-flow and out-flow vessels.  COMPARISON:  Renal ultrasound 08/28/2014; CT abdomen/ pelvis 08/26/2014  FINDINGS: Portal Vein Velocities  Main:  18-27 cm/sec with normal hepatopetal flow.  Right:  Unable to visualize  Left:  Unable to visualize  Hepatic Vein Velocities  Right:  33 cm/sec  Middle:  38 cm/sec  Left:  29 cm/sec  Hepatic Artery Velocity:  69 cm/sec  Splenic Vein Velocity:  20 cm/sec, difficult to visualize.  Varices: None visualized.  Ascites: None.  Bilateral right larger than left pleural effusions. The liver is echogenic suggesting fatty infiltration.  IMPRESSION: 1. Main portal vein is patent with normal hepatopetal flow. Intrahepatic portal veins are difficult image secondary to a combination of hepatic steatosis and patient body habitus. Given the unremarkable appearance of the main portal vein, significant intrahepatic portal venous thrombosis is considered somewhat unlikely. 2. Hepatic steatosis. 3. Hepatic veins remain patent. 4. Right larger than left pleural effusions.   Electronically Signed   By: Jacqulynn Cadet M.D.   On: 08/29/2014 13:45   Dg Chest Port 1 View  08/28/2014   CLINICAL DATA:  Central line placement.  EXAM: PORTABLE CHEST  - 1 VIEW  COMPARISON:  08/26/2014  FINDINGS: New left internal jugular central venous catheter tip in the proximal SVC. There is no pneumothorax. The right chest port remains in place, tip in the SVC. Cardiomegaly is unchanged. Hazy opacity at both lung bases, left greater than right, likely effusions and not significantly changed allowing for differences in technique. Stable prominence of central pulmonary vasculature. No new airspace disease.  IMPRESSION: Tip of the left internal jugular central venous catheter in the SVC. No pneumothorax.   Electronically Signed   By: Jeb Levering M.D.   On: 08/28/2014 18:17   Dg Chest Port 1 View  08/17/2014   CLINICAL DATA:  Nausea, vomiting, weakness and diarrhea. History of breast cancer, on chemotherapy.  EXAM: PORTABLE CHEST - 1 VIEW  COMPARISON:  Chest radiograph June 30, 2014  FINDINGS: The cardiac silhouette is moderately enlarged, increased from prior examination. Pulmonary vascular congestion mild interstitial prominence with small LEFT pleural effusion. Patchy retrocardiac airspace opacity suggested. No pneumothorax. Single lumen RIGHT chest Port-A-Cath with distal tip projecting in proximal superior vena cava. Surgical clips in RIGHT chest.  IMPRESSION: Worsening moderate cardiomegaly with pulmonary vascular congestion interstitial prominence most consistent with pulmonary edema, confluent in the LEFT lung base with small LEFT pleural effusion.   Electronically Signed   By: Elon Alas   On: 08/17/2014 06:10    ASSESSMENT AND PLAN 1.  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  Currently reasonably stable on metoprolol 25 mg twice a day 2.  Cardiomyopathy with ejection fraction 35% down from 60% in February 2016 3.  Rhabdomyolysis and acute renal failure, improving 4.  Thrombocytopenia with purpura of toes 5.  Breast cancer  Plan: Will continue current metoprolol 25 mg twice a day.  Increase activity as  tolerated.   Signed, Darlin Coco MD

## 2014-09-02 DIAGNOSIS — N390 Urinary tract infection, site not specified: Secondary | ICD-10-CM

## 2014-09-02 DIAGNOSIS — Z515 Encounter for palliative care: Secondary | ICD-10-CM

## 2014-09-02 DIAGNOSIS — G629 Polyneuropathy, unspecified: Secondary | ICD-10-CM

## 2014-09-02 DIAGNOSIS — N189 Chronic kidney disease, unspecified: Secondary | ICD-10-CM | POA: Insufficient documentation

## 2014-09-02 DIAGNOSIS — G5793 Unspecified mononeuropathy of bilateral lower limbs: Secondary | ICD-10-CM | POA: Insufficient documentation

## 2014-09-02 DIAGNOSIS — N179 Acute kidney failure, unspecified: Secondary | ICD-10-CM | POA: Insufficient documentation

## 2014-09-02 DIAGNOSIS — D696 Thrombocytopenia, unspecified: Secondary | ICD-10-CM | POA: Insufficient documentation

## 2014-09-02 LAB — COMPREHENSIVE METABOLIC PANEL
ALK PHOS: 213 U/L — AB (ref 39–117)
ALT: 62 U/L — AB (ref 0–35)
AST: 97 U/L — AB (ref 0–37)
Albumin: 2.5 g/dL — ABNORMAL LOW (ref 3.5–5.2)
Anion gap: 9 (ref 5–15)
BILIRUBIN TOTAL: 1.8 mg/dL — AB (ref 0.3–1.2)
BUN: 30 mg/dL — ABNORMAL HIGH (ref 6–23)
CHLORIDE: 98 mmol/L (ref 96–112)
CO2: 32 mmol/L (ref 19–32)
Calcium: 8.8 mg/dL (ref 8.4–10.5)
Creatinine, Ser: 1.42 mg/dL — ABNORMAL HIGH (ref 0.50–1.10)
GFR calc Af Amer: 43 mL/min — ABNORMAL LOW (ref >90)
GFR, EST NON AFRICAN AMERICAN: 37 mL/min — AB (ref >90)
Glucose, Bld: 105 mg/dL — ABNORMAL HIGH (ref 70–99)
Potassium: 3.9 mmol/L (ref 3.5–5.1)
SODIUM: 139 mmol/L (ref 135–145)
Total Protein: 5.1 g/dL — ABNORMAL LOW (ref 6.0–8.3)

## 2014-09-02 LAB — CBC
HEMATOCRIT: 38.6 % (ref 36.0–46.0)
Hemoglobin: 12.2 g/dL (ref 12.0–15.0)
MCH: 28.8 pg (ref 26.0–34.0)
MCHC: 31.6 g/dL (ref 30.0–36.0)
MCV: 91 fL (ref 78.0–100.0)
Platelets: 41 10*3/uL — ABNORMAL LOW (ref 150–400)
RBC: 4.24 MIL/uL (ref 3.87–5.11)
RDW: 21.8 % — ABNORMAL HIGH (ref 11.5–15.5)
WBC: 7.2 10*3/uL (ref 4.0–10.5)

## 2014-09-02 MED ORDER — SODIUM CHLORIDE 0.9 % IJ SOLN
10.0000 mL | INTRAMUSCULAR | Status: DC | PRN
  Administered 2014-09-02 – 2014-09-04 (×7): 10 mL
  Filled 2014-09-02 (×6): qty 40

## 2014-09-02 MED ORDER — GABAPENTIN 100 MG PO CAPS
100.0000 mg | ORAL_CAPSULE | Freq: Two times a day (BID) | ORAL | Status: DC
  Administered 2014-09-02 – 2014-09-04 (×5): 100 mg via ORAL
  Filled 2014-09-02 (×6): qty 1

## 2014-09-02 NOTE — Progress Notes (Signed)
Patient Name: Cheyenne Riggs Date of Encounter: 09/02/2014     Active Problems:   Renal failure   Acute renal failure syndrome   Transaminitis   Elevated liver function tests   Weakness    SUBJECTIVE  No new symptoms this morning.  No chest pain or increased dyspnea.  Rhythm stable normal sinus rhythm on metoprolol 25 mg twice a day  CURRENT MEDS . antiseptic oral rinse  7 mL Mouth Rinse q12n4p  . cefTRIAXone (ROCEPHIN)  IV  1 g Intravenous Q24H  . chlorhexidine  15 mL Mouth Rinse BID  . furosemide  40 mg Oral Daily  . metoprolol tartrate  25 mg Oral BID  . sodium chloride  3 mL Intravenous Q12H    OBJECTIVE  Filed Vitals:   09/01/14 2300 09/02/14 0000 09/02/14 0100 09/02/14 0400  BP:  145/81    Pulse: 69 65 62   Temp:  97.7 F (36.5 C)  97.8 F (36.6 C)  TempSrc:  Axillary  Axillary  Resp: 18 19 14    Height:      Weight:    197 lb 8.5 oz (89.6 kg)  SpO2: 93% 93% 99%     Intake/Output Summary (Last 24 hours) at 09/02/14 8032 Last data filed at 09/02/14 0600  Gross per 24 hour  Intake    230 ml  Output      0 ml  Net    230 ml   Filed Weights   08/31/14 0300 09/01/14 0500 09/02/14 0400  Weight: 193 lb 2 oz (87.6 kg) 198 lb 13.7 oz (90.2 kg) 197 lb 8.5 oz (89.6 kg)    PHYSICAL EXAM  General: Pleasant, NAD. Neuro: Alert and oriented X 3. Moves all extremities spontaneously. Psych: Normal affect. HEENT:  Normal  Neck: Supple without bruits or JVD. Lungs:  Resp regular and unlabored, CTA. Heart: RRR no s3, s4, or murmurs. Abdomen: Soft, non-tender, non-distended, BS + x 4.  Extremities: She has mottling of her toes.  Weak pedal pulses.  No pitting edema.  Accessory Clinical Findings  CBC  Recent Labs  09/01/14 0400 09/02/14 0415  WBC 8.0 7.2  HGB 11.8* 12.2  HCT 37.1 38.6  MCV 89.4 91.0  PLT 30* 41*   Basic Metabolic Panel  Recent Labs  09/01/14 0910 09/02/14 0415  NA 137 139  K 4.2 3.9  CL 97 98  CO2 32 32  GLUCOSE 104* 105*  BUN  30* 30*  CREATININE 1.43* 1.42*  CALCIUM 8.7 8.8   Liver Function Tests  Recent Labs  09/01/14 0910 09/02/14 0415  AST 119* 97*  ALT 75* 62*  ALKPHOS 223* 213*  BILITOT 2.0* 1.8*  PROT 5.2* 5.1*  ALBUMIN 2.7* 2.5*   No results for input(s): LIPASE, AMYLASE in the last 72 hours. Cardiac Enzymes  Recent Labs  08/31/14 0540 09/01/14 0910  CKTOTAL 918* 658*   BNP Invalid input(s): POCBNP D-Dimer No results for input(s): DDIMER in the last 72 hours. Hemoglobin A1C No results for input(s): HGBA1C in the last 72 hours. Fasting Lipid Panel No results for input(s): CHOL, HDL, LDLCALC, TRIG, CHOLHDL, LDLDIRECT in the last 72 hours. Thyroid Function Tests No results for input(s): TSH, T4TOTAL, T3FREE, THYROIDAB in the last 72 hours.  Invalid input(s): FREET3  TELE  Currently normal sinus rhythm  ECG    Radiology/Studies  Ct Abdomen Pelvis Wo Contrast  08/26/2014   CLINICAL DATA:  Weakness and dizziness.  EXAM: CT ABDOMEN AND PELVIS WITHOUT CONTRAST  TECHNIQUE:  Multidetector CT imaging of the abdomen and pelvis was performed following the standard protocol without IV contrast.  COMPARISON:  09/22/2009  FINDINGS: There are large bilateral pleural effusions, right larger than left. These later dependently an or associated with dependent pulmonary atelectasis. The heart is enlarged. No significant pericardial fluid is seen.  The liver has a normal appearance without contrast. There has been previous cholecystectomy. The spleen is normal. The pancreas is normal. The adrenal glands show hypertrophic change. The right kidney is normal. The left kidney is atrophic and contains several stones in the lower pole. No sign of active obstruction. There is atherosclerosis of the aorta. The IVC is normal. No retroperitoneal mass or lymphadenopathy. No ascites. No bowel obstruction. Tiny amount of free fluid in the pelvis. There is diverticulosis of the sigmoid colon. Low level diverticulitis  could not be excluded but there is no pronounced diverticulitis. There has been previous hysterectomy. There chronic degenerative changes of the spine.  IMPRESSION: Large pleural effusions, right larger than left. Dependent pulmonary atelectasis.  Previous cholecystectomy and hysterectomy.  Left renal atrophy with nonobstructing renal stones in the lower pole.  Atherosclerosis of the aorta and its branch vessels.  Diverticulosis. Small amount of free fluid in the pelvis. Diverticulitis is not confidently demonstrated, though low level diverticulitis could be in apparent by imaging.   Electronically Signed   By: Nelson Chimes M.D.   On: 08/26/2014 16:17   Dg Chest 2 View  08/26/2014   CLINICAL DATA:  Weakness and dizziness. Currently undergoing chemotherapy for right upper outer quadrant breast carcinoma.  EXAM: CHEST  2 VIEW  COMPARISON:  08/17/2014  FINDINGS: Moderate cardiomegaly remains stable. Right-sided Port-A-Cath remains in appropriate position.  Decrease in diffuse interstitial infiltrates seen since previous study, consistent with decreased interstitial edema or pneumonitis. No evidence of pulmonary consolidation. Probable tiny layering bilateral pleural effusions noted on lateral projection.  IMPRESSION: Resolving diffuse interstitial infiltrates. Stable cardiomegaly and persistent small layering bilateral pleural effusions.   Electronically Signed   By: Earle Gell M.D.   On: 08/26/2014 13:42   US Renal  08/28/2014   CLINICAL DATA:  Acute renal failure.  EXAM: RENAL/URINARY TRACT ULTRASOUND COMPLETE  COMPARISON:  CT abdomen and pelvis August 26, 2014  FINDINGS: Right Kidney:  Length: 10.6 cm. Echogenicity and renal cortical thickness are within normal limits. No mass, perinephric fluid, or hydronephrosis visualized. No sonographically demonstrable calculus or ureterectasis.  Left Kidney:  Length: 7.8 cm. The left kidney is atrophic with areas of scarring. There are several lower pole region calculi,  largest measuring 9 mm. There is no demonstrable mass, hydronephrosis, or perinephric fluid. No ureterectasis.  Bladder:  Appears normal for degree of bladder distention.  IMPRESSION: Comparatively atrophic left kidney with lower pole calculi. Appearance is commensurate with recent CT. There is no mass or hydronephrosis on either side. No perinephric fluid collections are appreciable. Right kidney appears normal by ultrasound.   Electronically Signed   By: Lowella Grip III M.D.   On: 08/28/2014 16:19   [place] Art/ven Flow Abd Pelv Doppler  08/29/2014   CLINICAL DATA:  69 year old female with acute renal failure  EXAM: DUPLEX ULTRASOUND OF LIVER  TECHNIQUE: Color and duplex Doppler ultrasound was performed to evaluate the hepatic in-flow and out-flow vessels.  COMPARISON:  Renal ultrasound 08/28/2014; CT abdomen/ pelvis 08/26/2014  FINDINGS: Portal Vein Velocities  Main:  18-27 cm/sec with normal hepatopetal flow.  Right:  Unable to visualize  Left:  Unable to visualize  Hepatic  Vein Velocities  Right:  33 cm/sec  Middle:  38 cm/sec  Left:  29 cm/sec  Hepatic Artery Velocity:  69 cm/sec  Splenic Vein Velocity:  20 cm/sec, difficult to visualize.  Varices: None visualized.  Ascites: None.  Bilateral right larger than left pleural effusions. The liver is echogenic suggesting fatty infiltration.  IMPRESSION: 1. Main portal vein is patent with normal hepatopetal flow. Intrahepatic portal veins are difficult image secondary to a combination of hepatic steatosis and patient body habitus. Given the unremarkable appearance of the main portal vein, significant intrahepatic portal venous thrombosis is considered somewhat unlikely. 2. Hepatic steatosis. 3. Hepatic veins remain patent. 4. Right larger than left pleural effusions.   Electronically Signed   By: Jacqulynn Cadet M.D.   On: 08/29/2014 13:45   Dg Chest Port 1 View  08/28/2014   CLINICAL DATA:  Central line placement.  EXAM: PORTABLE CHEST - 1 VIEW   COMPARISON:  08/26/2014  FINDINGS: New left internal jugular central venous catheter tip in the proximal SVC. There is no pneumothorax. The right chest port remains in place, tip in the SVC. Cardiomegaly is unchanged. Hazy opacity at both lung bases, left greater than right, likely effusions and not significantly changed allowing for differences in technique. Stable prominence of central pulmonary vasculature. No new airspace disease.  IMPRESSION: Tip of the left internal jugular central venous catheter in the SVC. No pneumothorax.   Electronically Signed   By: Jeb Levering M.D.   On: 08/28/2014 18:17   Dg Chest Port 1 View  08/17/2014   CLINICAL DATA:  Nausea, vomiting, weakness and diarrhea. History of breast cancer, on chemotherapy.  EXAM: PORTABLE CHEST - 1 VIEW  COMPARISON:  Chest radiograph June 30, 2014  FINDINGS: The cardiac silhouette is moderately enlarged, increased from prior examination. Pulmonary vascular congestion mild interstitial prominence with small LEFT pleural effusion. Patchy retrocardiac airspace opacity suggested. No pneumothorax. Single lumen RIGHT chest Port-A-Cath with distal tip projecting in proximal superior vena cava. Surgical clips in RIGHT chest.  IMPRESSION: Worsening moderate cardiomegaly with pulmonary vascular congestion interstitial prominence most consistent with pulmonary edema, confluent in the LEFT lung base with small LEFT pleural effusion.   Electronically Signed   By: Elon Alas   On: 08/17/2014 06:10    ASSESSMENT AND PLAN 1.  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  Currently reasonably stable on metoprolol 25 mg twice a day 2.  Cardiomyopathy with ejection fraction 35% down from 60% in February 2016 3.  Rhabdomyolysis and acute renal failure, improving 4.  Thrombocytopenia with purpura of toes 5.  Breast cancer  Plan: Will continue current metoprolol 25 mg twice a day.  Increase activity as tolerated.  Patient states  that she is planning to go to an assisted living facility after discharge.   Signed, Darlin Coco MD

## 2014-09-02 NOTE — Plan of Care (Signed)
Problem: Phase III Progression Outcomes Goal: Tolerating diet Outcome: Not Progressing Very poor nutrition.

## 2014-09-02 NOTE — Consult Note (Addendum)
Patient Cheyenne Riggs      DOB: Aug 14, 1945      UTM:[id number]     Consult Note from the Palliative Medicine Team at [facility] Requested by: Dr Doyle Askew     PCP: Gennette Pac, MD [facility]     Phone Number:[phone]  Assessment/Recommendations: 69 yo female with triple negative breast CA admitted 4/13 with generalized weakness 2/2 rhabdo, AKI, transaminitis   1.  Code Status: Full  2. GOC: Spoke at [employer] today with Remo Lipps. I think she has a fairly good understanding of her issues here in hospital.  She is aware that further adjuvant chemo is not planned as she discussed with Dr Lindi Adie.  She also does not have evidence of any recurrent disease at this point either and I think her issues going forward in short term may be more related to how she recovers and hopefully avoids setbacks related to kidneys/liver/heart function.  Her understanding about her SVT, tachy/brady syndrome is that things are stabilizing and there is hope for continued stabilization/improvement of this.There is a question to me about hospice care, but given her improvement in multiple organ systems and that she does not have metastatic cancer, I do not think she would qualify for hospice care.  I discussed case with Dr Lindi Adie about this as well.  Her goal is to get to SNF and do rehab and see how she can recover from these acute issues and hopes of not having major setbacks now that chemo has been discontinued. She knows she remains risks for further setbacks though in future given how sick she has been for past few months. Because of this, i think it would be reasonable to have outpatient palliative care services follow at least in short term while she is at [facility].   I did speak to her today about advance care planning and she states she has already made a living will.  Her son [name] is her HCPOA.  She would only want ventilation/cardiac resuscitation for short term. She would not  want prolonged measures such as tracheostomy or to have these measures performed if disease process not felt to be reversible.  She tells me she has had these discussions with her son and he knows her wishes.    3. Symptom Management:   1. Bilateral foot pain/neuropathic- describes significant neuropathic component.  Will try very low dose gabapentin given her recent AKI.  Can use PRN opioids. If wanting to get off IV meds, could do oxycodone 2.5mg  PRN. Would probably avoid percocet or hyrdocodone given recent transaminitis with fears of tylenol playing role.    4. Psychosocial/Spiritual: Was living at home alone. Divorced. 1 son and no other children. Does not feel like she can currently care for herself at home and seeking SNF upon discharge.     Brief HPI: 69 yo female with PMHx of Stage IIA (pathologic) Breast CA with triple negative disease s/p lumpectomy and adjuvant chemotherapy who has had complicated post-chemo course.  She has had multiple hospitalizations related to diarrhea/dehydration/C-diff, wide complex tachycardia. She was admitted on 4/13 with generalized weakness after taking home dose of ativan. On admission she was noted to have AKI and elevated CK consistent with rhabdomyolysis, thrombocytopenia, severe elevation in transaminases in setting of recent tylenol ingestion.  She recently had echocardiogram with newly reduced EF of 35% as well. With supportive care, she has had marked improvement in her transaminases as well as renal function.  Cardiology has  been following for tachy/brady syndrome in setting of paroxysmal afib.  Oncology following here and do not feel she can tolerate further chemotherapy.  Palliative care consulted for goals of care and potentially hospice care.   Today, Evone states she is feeling quite a bit better.  Feels like her strength and appetite are coming back. She is working with PT and was up in chair for 30 minutes yesterday. Her largest complaint is pain in  her feet.  She reports a cramping pain in bilateral toes/feet with associated numbness/burning. Reports good relief from low dose opioids. Has difficulty with therapy as pressure on her feet makes pain worse.  Denies any dyspnea, n/v, abd pain, constipation, diarrhea.          PMH:  Past Medical History  Diagnosis Date  . Anxiety   . Back pain   . H/O bladder problems   . Cholelithiasis   . Depression   . High blood pressure   . Hypercholesteremia   . Kidney stones   . Gum disease   . Complication of anesthesia     bp goes up  . Wears glasses   . Wears dentures     top  . Kidney stones   . Cancer   . Breast cancer      PSH: Past Surgical History  Procedure Laterality Date  . Colon biopsy    . Mouth surgery    . Spine surgery    . Colonoscopy    . Tubal ligation    . Lithotripsy    . Rhinoplasty    . Back surgery  1989    lumb lam  . Breast biopsy Right 02/05/2014    invasive ductal cncer  . Abdominal hysterectomy  1982  . Cholecystectomy  2011    lap choli  . Radioactive seed guided mastectomy with axillary sentinel lymph node biopsy Right 02/27/2014    Procedure: RADIOACTIVE SEED GUIDED RIGHT PARTIAL MASTECTOMY WITH AXILLARY SENTINEL LYMPH NODE BIOPSY;  Surgeon: Stark Klein, MD;  Location: [facility];  Service: General;  Laterality: Right;  . Portacath placement N/A 02/27/2014    Procedure: INSERTION PORT-A-CATH;  Surgeon: Stark Klein, MD;  Location: [facility];  Service: General;  Laterality: N/A;   I have reviewed the [place] and SH and  If appropriate update it with new information. Allergies  Allergen Reactions  . Ambien [Zolpidem Tartrate] Other (See Comments)    Per Pt: causes sleepwalking, makes loopy.  . Ciprofloxacin Nausea And Vomiting  . Demerol [Meperidine] Hives  . Lidocaine     palpatations  . Other Swelling    Eye ointments  . Septra [Sulfamethoxazole-Trimethoprim] Nausea And Vomiting  . Codeine Rash  . Novocain  [Procaine] Palpitations  . Vancomycin Rash   Scheduled Meds: . antiseptic oral rinse  7 mL Mouth Rinse q12n4p  . cefTRIAXone (ROCEPHIN)  IV  1 g Intravenous Q24H  . chlorhexidine  15 mL Mouth Rinse BID  . furosemide  40 mg Oral Daily  . gabapentin  100 mg Oral BID  . metoprolol tartrate  25 mg Oral BID  . sodium chloride  3 mL Intravenous Q12H   Continuous Infusions:  PRN Meds:.diphenhydrAMINE, metoprolol, morphine injection, ondansetron **OR** ondansetron (ZOFRAN) IV    BP 144/66 mmHg  Pulse 67  Temp(Src) 97.8 F (36.6 C) (Axillary)  Resp 15  Ht 5\' 3"  (1.6 m)  Wt 89.6 kg (197 lb 8.5 oz)  BMI 35.00 kg/m2  SpO2 100%   PPS:  40  Intake/Output Summary (Last 24 hours) at 09/02/14 1346 Last data filed at 09/02/14 0900  Gross per 24 hour  Intake    280 ml  Output      0 ml  Net    280 ml    Physical Exam:  General: Alert, NAD HEENT:  [place], sclera anicteric, mmm Neck: supple Chest:  CTAB CVS: RRR Abdomen: soft, NT Ext: cool distal ext, purpuric   Labs: CBC    Component Value Date/Time   WBC 7.2 09/02/2014 0415   WBC 4.9 08/13/2014 1012   RBC 4.24 09/02/2014 0415   RBC 4.34 08/13/2014 1012   HGB 12.2 09/02/2014 0415   HGB 11.7 08/13/2014 1012   HCT 38.6 09/02/2014 0415   HCT 37.6 08/13/2014 1012   PLT 41* 09/02/2014 0415   PLT 222 08/13/2014 1012   MCV 91.0 09/02/2014 0415   MCV 86.7 08/13/2014 1012   MCH 28.8 09/02/2014 0415   MCH 27.1 08/13/2014 1012   MCHC 31.6 09/02/2014 0415   MCHC 31.2* 08/13/2014 1012   RDW 21.8* 09/02/2014 0415   RDW 18.6* 08/13/2014 1012   LYMPHSABS 1.0 08/26/2014 1213   LYMPHSABS 0.6* 08/13/2014 1012   MONOABS 0.9 08/26/2014 1213   MONOABS 0.5 08/13/2014 1012   EOSABS 0.0 08/26/2014 1213   EOSABS 0.0 08/13/2014 1012   BASOSABS 0.0 08/26/2014 1213   BASOSABS 0.0 08/13/2014 1012    BMET    Component Value Date/Time   NA 139 09/02/2014 0415   NA 143 08/24/2014 1223   K 3.9 09/02/2014 0415   K 3.4* 08/24/2014 1223   CL  98 09/02/2014 0415   CO2 32 09/02/2014 0415   CO2 24 08/24/2014 1223   GLUCOSE 105* 09/02/2014 0415   GLUCOSE 114 08/24/2014 1223   BUN 30* 09/02/2014 0415   BUN 8.9 08/24/2014 1223   CREATININE 1.42* 09/02/2014 0415   CREATININE 1.2* 08/24/2014 1223   CALCIUM 8.8 09/02/2014 0415   CALCIUM 8.9 08/24/2014 1223   GFRNONAA 37* 09/02/2014 0415   GFRAA 43* 09/02/2014 0415    CMP     Component Value Date/Time   NA 139 09/02/2014 0415   NA 143 08/24/2014 1223   K 3.9 09/02/2014 0415   K 3.4* 08/24/2014 1223   CL 98 09/02/2014 0415   CO2 32 09/02/2014 0415   CO2 24 08/24/2014 1223   GLUCOSE 105* 09/02/2014 0415   GLUCOSE 114 08/24/2014 1223   BUN 30* 09/02/2014 0415   BUN 8.9 08/24/2014 1223   CREATININE 1.42* 09/02/2014 0415   CREATININE 1.2* 08/24/2014 1223   CALCIUM 8.8 09/02/2014 0415   CALCIUM 8.9 08/24/2014 1223   PROT 5.1* 09/02/2014 0415   PROT 5.8* 08/13/2014 1012   ALBUMIN 2.5* 09/02/2014 0415   ALBUMIN 3.1* 08/13/2014 1012   AST 97* 09/02/2014 0415   AST 18 08/13/2014 1012   ALT 62* 09/02/2014 0415   ALT 6 08/13/2014 1012   ALKPHOS 213* 09/02/2014 0415   ALKPHOS 70 08/13/2014 1012   BILITOT 1.8* 09/02/2014 0415   BILITOT 0.72 08/13/2014 1012   GFRNONAA 37* 09/02/2014 0415   GFRAA 43* 09/02/2014 0415   4/13 CT Abd/Pelvis IMPRESSION: Large pleural effusions, right larger than left. Dependent pulmonary atelectasis.  Previous cholecystectomy and hysterectomy.  Left renal atrophy with nonobstructing renal stones in the lower pole.  Atherosclerosis of the aorta and its branch vessels.  Diverticulosis. Small amount of free fluid in the pelvis. Diverticulitis is not confidently demonstrated, though low level diverticulitis could  be in apparent by imaging.   4/16 LUQ [place] with doppler IMPRESSION: 1. Main portal vein is patent with normal hepatopetal flow. Intrahepatic portal veins are difficult image secondary to a combination of hepatic steatosis  and patient body habitus. Given the unremarkable appearance of the main portal vein, significant intrahepatic portal venous thrombosis is considered somewhat unlikely. 2. Hepatic steatosis. 3. Hepatic veins remain patent. 4. Right larger than left pleural effusions.  Total Time: 80 minutes Greater than 50%  of this time was spent counseling and coordinating care related to the above assessment and plan. Discussed case with Dr Lindi Adie.   Doran Clay D.O. Palliative Medicine Team at [facility]  Pager: [phone] Team Phone: [phone]

## 2014-09-02 NOTE — Progress Notes (Signed)
Physical Therapy Treatment Patient Details Name: Cheyenne Riggs MRN: [id number] DOB: Mar 05, 1946 Today's Date: 09/02/2014    History of Present Illness Pt is 69 yo female with known breast cancer and currently undergoing Abraxane chemotherapy, with chronic nausea since starting chemo, recent diagnosis of C. Diff and completed treatment with flagyl, discharged on April 6th, 2016 after being hospitalized for evaluation of wide complex tachycardia, elevated troponins, treated with beta blocker, acute on chronic combined systolic and diastolic CHF (last 2 D ECHO EF 35%) presented to [facility] ED with main concern of generalized weakness after taking dose of Ativan at home. Currently with acute transaminitis, acute on chronic renal failure SVT with hypotension, metabolic acidoisis and UTI.     PT Comments    Patient tolerated brief standing at bedside, appears to feel a bit better, foot pain will be limiting.  Follow Up Recommendations  SNF;Supervision/Assistance - 24 hour     Equipment Recommendations  None recommended by PT    Recommendations for Other Services       Precautions / Restrictions Precautions Precautions: Fall Precaution Comments: painful ischemic looking feet/toes    Mobility  Bed Mobility Overal bed mobility: Needs Assistance Bed Mobility: Supine to Sit;Sit to Supine;Rolling Rolling: Max assist   Supine to sit: +2 for safety/equipment;+2 for physical assistance;Max assist Sit to supine: +2 for safety/equipment;+2 for physical assistance;Max assist   General bed mobility comments: assist for legs, patient unable to lift off of bed nor uncross after rolling, assist legs on to bed, assist with trunk  Transfers Overall transfer level: Needs assistance Equipment used: 2 person hand held assist Transfers: Sit to/from Stand Sit to Stand: Mod assist;From elevated surface;+2 physical assistance;+2 safety/equipment         General transfer comment: lifting assist  to rise,  support at both  arms, decreased tolerance to weight bear on Feet.  Ambulation/Gait             General Gait Details: unable due to painful feet.   Stairs            Wheelchair Mobility    Modified Rankin (Stroke Patients Only)       Balance   Sitting-balance support: Feet supported;Bilateral upper extremity supported Sitting balance-Leahy Scale: Fair                              Cognition Arousal/Alertness: Awake/alert                          Exercises      General Comments        Pertinent Vitals/Pain Pain Assessment: 0-10 Pain Score: 5  Pain Location: bil foot pain with standing Pain Descriptors / Indicators: Burning;Grimacing Pain Intervention(s): Monitored during session;Repositioned    Home Living                      Prior Function            PT Goals (current goals can now be found in the care plan section) Progress towards PT goals: Progressing toward goals    Frequency  Min 3X/week    PT Plan Current plan remains appropriate    Co-evaluation             End of Session   Activity Tolerance: Patient tolerated treatment well Patient left: in bed;with call bell/phone within reach;with bed alarm set  Time: [id number] PT Time Calculation (min) (ACUTE ONLY): 17 min  Charges:  $Therapeutic Activity: 8-22 mins                    G Codes:      Marcelino Freestone PT [phone]   09/02/2014, 1:58 PM

## 2014-09-02 NOTE — Progress Notes (Addendum)
[facility] PROGRESS NOTE  Cheyenne Riggs SFK:[id number] DOB: 1946/02/19 DOA: 08/26/2014  PCP: Gennette Pac, MD  Brief HPI: 69 year old Caucasian female with a past medical history of breast cancer on chemotherapy, presented with generalized weakness. She was found to have acute renal failure with severe transaminitis. During the course of the hospitalization, she developed hypotension with tachycardia and bradycardic events. She was transferred to the intensive care unit. She also was noted to have rhabdomyolysis and possibly acute drug-induced hepatitis. She was very slow to improve. She was transferred out of the stepdown unit on April 20.  Past medical history:  Past Medical History  Diagnosis Date  . Anxiety   . Back pain   . H/O bladder problems   . Cholelithiasis   . Depression   . High blood pressure   . Hypercholesteremia   . Kidney stones   . Gum disease   . Complication of anesthesia     bp goes up  . Wears glasses   . Wears dentures     top  . Kidney stones   . Cancer   . Breast cancer     Major events since admission: 4/15 - sudden hypotension with SVT requiring transfer to stepdown unit, central line placed, required pressors, amiodarone drip 4/16 - blood pressure stable, off pressors, LFTs and renal function improving 4/17 - intermittent episodes of SVT's wide complex, pt asymptomatic, Plt count dropping and oncology consulted, stopped amiodarone  4/18 - still with intermittent SVT's, right foot toe ischemic, pt with no chest pain this AM, cardiology consulted  4/20 - transfer to telemetry.  Consultants:  PCCM - signed off 4/16 Oncology  Cardiology Palliative medicine  Procedures: Central line placement in the left IJ on April 15  Antibiotics: Rocephin 4/14 --> 4/20  Subjective: Patient feels well this morning. Denies any complaints. No nausea, vomiting. No chest pain. Denies difficulty breathing.  Objective: Vital Signs  Filed  Vitals:   09/02/14 0400 09/02/14 0500 09/02/14 0600 09/02/14 0700  BP: 155/85  141/68   Pulse: 66 65 63 64  Temp: 97.8 F (36.6 C)     TempSrc: Axillary     Resp: 13 10 10 16   Height:      Weight: 89.6 kg (197 lb 8.5 oz)     SpO2: 98% 94% 93% 97%    Intake/Output Summary (Last 24 hours) at 09/02/14 0725 Last data filed at 09/02/14 0600  Gross per 24 hour  Intake    230 ml  Output      0 ml  Net    230 ml   Filed Weights   08/31/14 0300 09/01/14 0500 09/02/14 0400  Weight: 87.6 kg (193 lb 2 oz) 90.2 kg (198 lb 13.7 oz) 89.6 kg (197 lb 8.5 oz)    General appearance: alert, cooperative, appears stated age and no distress Resp: Decreased air entry at the bases without any wheezing, rales or rhonchi. Cardio: regular rate and rhythm, S1, S2 normal, no murmur, click, rub or gallop GI: soft, non-tender; bowel sounds normal; no masses,  no organomegaly Extremities: extremities normal, atraumatic, no cyanosis or edema Neurologic: No focal deficits  Lab Results:  Basic Metabolic Panel:  Recent Labs Lab 08/26/14 1945  08/29/14 0450 08/30/14 0515 08/31/14 0540 09/01/14 0400 09/01/14 0910 09/02/14 0415  NA 138  < > 136  137 138 134* 137 137 139  K 4.5  < > 4.4  4.3 3.8 3.6 4.1 4.2 3.9  CL 106  < >  105  105 103 97 97 97 98  CO2 18*  < > 21  20 28 29 31  32 32  GLUCOSE 85  < > 164*  163* 139* 141* 107* 104* 105*  BUN 17  < > 39*  41* 37* 32* 30* 30* 30*  CREATININE 2.32*  < > 2.80*  2.87* 2.15* 1.59* 1.37* 1.43* 1.42*  CALCIUM 9.0  < > 8.5  8.6 8.3* 7.9* 8.5 8.7 8.8  MG 1.8  --   --   --   --   --   --   --   PHOS 6.9*  --  5.0*  --   --   --   --   --   < > = values in this interval not displayed. Liver Function Tests:  Recent Labs Lab 08/29/14 0450 08/30/14 0515 08/31/14 0540 09/01/14 0910 09/02/14 0415  AST 529* 238* 150* 119* 97*  ALT 179* 121* 91* 75* 62*  ALKPHOS 275* 234* 206* 223* 213*  BILITOT 2.8* 2.1* 1.9* 2.0* 1.8*  PROT 4.9* 5.0* 4.9* 5.2* 5.1*   ALBUMIN 2.7*  2.7* 2.6* 2.5* 2.7* 2.5*    Recent Labs Lab 08/26/14 1226  LIPASE 16    Recent Labs Lab 08/30/14 0515  AMMONIA 26   CBC:  Recent Labs Lab 08/26/14 1213  08/28/14 0450 08/30/14 0515 08/31/14 0540 09/01/14 0400 09/02/14 0415  WBC 10.6*  < > 12.2* 9.9 8.2 8.0 7.2  NEUTROABS 8.7*  --   --   --   --   --   --   HGB 12.1  < > 12.7 12.0 11.7* 11.8* 12.2  HCT 37.5  < > 40.5 37.1 36.7 37.1 38.6  MCV 87.4  < > 89.0 88.1 89.1 89.4 91.0  PLT 68*  < > 45* 29* 30* 30* 41*  < > = values in this interval not displayed. Cardiac Enzymes:  Recent Labs Lab 08/28/14 0450 08/29/14 0450 08/30/14 0515 08/31/14 0540 09/01/14 0910  CKTOTAL 3297* 2364* 1226* 918* 658*  CKMB 49.6*  --   --   --   --    BNP (last 3 results)  Recent Labs  06/29/14 1740 08/17/14 0955  BNP 534.8* 1980.9*     Recent Results (from the past 240 hour(s))  MRSA PCR Screening     Status: None   Collection Time: 08/28/14 10:23 AM  Result Value Ref Range Status   MRSA by PCR NEGATIVE NEGATIVE Final    Comment:        The GeneXpert MRSA Assay (FDA approved for NASAL specimens only), is one component of a comprehensive MRSA colonization surveillance program. It is not intended to diagnose MRSA infection nor to guide or monitor treatment for MRSA infections.   Clostridium Difficile by PCR     Status: None   Collection Time: 08/28/14  8:15 PM  Result Value Ref Range Status   C difficile by pcr NEGATIVE NEGATIVE Final  Culture, Urine     Status: None   Collection Time: 08/30/14  1:28 PM  Result Value Ref Range Status   Specimen Description URINE, CLEAN CATCH  Final   Special Requests NONE  Final   Colony Count   Final    70,000 COLONIES/ML Performed at [facility]    Culture   Final    Multiple bacterial morphotypes present, none predominant. Suggest appropriate recollection if clinically indicated. Performed at [facility]    Report Status 08/31/2014 FINAL   Final  Studies/Results: No results found.  Medications:  Scheduled: . antiseptic oral rinse  7 mL Mouth Rinse q12n4p  . cefTRIAXone (ROCEPHIN)  IV  1 g Intravenous Q24H  . chlorhexidine  15 mL Mouth Rinse BID  . furosemide  40 mg Oral Daily  . metoprolol tartrate  25 mg Oral BID  . sodium chloride  3 mL Intravenous Q12H   Continuous:  OHY:[id number], metoprolol, morphine injection, ondansetron **OR** ondansetron (ZOFRAN) IV  Assessment/Plan:  Active Problems:   Renal failure   Acute renal failure syndrome   Transaminitis   Elevated liver function tests   Weakness    Acute transaminitis secondary to drug induced hepatotoxicity, ? Ischemic hepatitis  - CT abd with no specific acute finding to explain transaminitis  - thought to be secondary to drug induced hepatotoxicity and rhabdomyolysis per GI doctor Dr. Watt Climes  - unlikely related to CHF as LFT got worse on Lasix and finally started to trend down with IVF c/w acute hepatotoxicity, drug induced  - Please note that patient initially denied taking Tylenol more than prescribed - upon further questioning 4/15, patient admitted to taking double to triple doses of tylenol for back pain and headaches - please note that Dr. Watt Climes recommended IVF and supportive care avoiding hepatotoxic medications  - Liver tests still elevated but continue improving and trending down  - Tylenol level remains undetectable - avoiding hepatotoxic medications   Acute on chronic renal failure stage III - last known Cr 1.13 on last admission, creatinine overall trending down.   - with elevated CK level on admission, this was thought to be secondary to rhabdomyolysis, progression from prerenal etiology to ATN, ? CIN (pt has received contrast for recent CT abd) - Systolic CHF with EF 27% precluded use of aggressive IV fluid hydration but pt had been receiving IVF NS at 75 cc/hr until 4/18 - Renal ultrasound 08/28/2014 with atrophic left  kidney, unremarkable right kidney, no signs of hydronephrosis - Continue to monitor intake and output  Right foot toes mottling  - pulses palpable but faint, monitor closely  SVT with hypotension, 08/28/2014 - Unclear of triggering event, required transfer to stepdown unit 4/15, with intermittent tachy - bradycardia episodes  - pt currently off pressors since 4/16 but with intermittent episodes of SVTs and paroxysmal a-fib overnight 4/16, 4/17, 4/18  - pt started on amiodarone drip 4/15 but given its hepatotoxicity, pt quickly tapered off amio  - continue metoprolol per home medical regimen 25 mg by mouth twice a day and metoprolol as needed  - cardiology has been following. Patient has remained stable.  Metabolic acidosis, resolved - in the setting of acute on chronic renal failure and rhabdomyolysis and progression to ATN - resolved with Bicarb, off bicarb since 4/17 - repeat BMP in AM  Rhabdomyolysis - Patient responded well to IV fluids, CK level continue trending down but now volume overloaded and weight up from 175 --> 198 lbs  - off IVF since 4/18   Acute on chronic respiratory failure secondary to acute on chronic systolic and diastolic CHF, with hypoxia (O2 sat 89% on RA) - last 2 D ECHO with EF 35%, known and persistent right side pleural effusion  - weight on admission 175 lbs --> 198 lbs on 4/19, from IVF pt has required for tx of rhabdo, ARF, acute hepatitis as noted above  - resumed lasix per home medical regimen 4/19 - close monitoring of volume status  - monitor daily weights, I's and O's - no ACEI  in the setting or renal failure  - keep on [place] O2  Leukocytosis secondary to UTI 4/14 - stop Rocephin after today's dose (would've completed 7 days). - WBC is now WNL - urine culture with multiple morphotypes present  Acute urinary retention on admission  - placed foley  - renal US with no signs of hydronephrosis  Essential HTN - blood pressure  stable.  Breast cancer - Oncology has been following - Patient's oncologist is aware that pt is no longer wanting chemo   Severe PCM - in the context of chronic illness - pt still with poor oral intake   Thrombocytopenia - unclear etiology. Counts are slightly improved today. - appreciate oncologist recommendations  - avoiding all heparin products as recommended by oncologist    DVT Prophylaxis: SCDs    Code Status: Full code  Family Communication: Discussed with the patient  Disposition Plan: Okay for transfer to telemetry. Not ready yet for discharge. Await palliative medicine input.  Follow-up Appointment?: Will need follow-up with oncology and PCP.   LOS: 7 days   [facility] Pager [phone] 09/02/2014, 7:25 AM  If 7PM-7AM, please contact night-coverage at [email or web address], password [facility]

## 2014-09-02 NOTE — Progress Notes (Signed)
Hand off report called to Ene, RN.  Patient transferring via hospital bed to room 1429.

## 2014-09-03 DIAGNOSIS — N179 Acute kidney failure, unspecified: Secondary | ICD-10-CM

## 2014-09-03 DIAGNOSIS — L97529 Non-pressure chronic ulcer of other part of left foot with unspecified severity: Secondary | ICD-10-CM

## 2014-09-03 DIAGNOSIS — L819 Disorder of pigmentation, unspecified: Secondary | ICD-10-CM

## 2014-09-03 DIAGNOSIS — L97519 Non-pressure chronic ulcer of other part of right foot with unspecified severity: Secondary | ICD-10-CM | POA: Insufficient documentation

## 2014-09-03 DIAGNOSIS — I998 Other disorder of circulatory system: Secondary | ICD-10-CM

## 2014-09-03 LAB — CBC
HCT: 37.2 % (ref 36.0–46.0)
Hemoglobin: 11.8 g/dL — ABNORMAL LOW (ref 12.0–15.0)
MCH: 28.7 pg (ref 26.0–34.0)
MCHC: 31.7 g/dL (ref 30.0–36.0)
MCV: 90.5 fL (ref 78.0–100.0)
Platelets: 37 10*3/uL — ABNORMAL LOW (ref 150–400)
RBC: 4.11 MIL/uL (ref 3.87–5.11)
RDW: 22 % — AB (ref 11.5–15.5)
WBC: 5.8 10*3/uL (ref 4.0–10.5)

## 2014-09-03 LAB — COMPREHENSIVE METABOLIC PANEL
ALT: 47 U/L — ABNORMAL HIGH (ref 0–35)
AST: 77 U/L — ABNORMAL HIGH (ref 0–37)
Albumin: 2.4 g/dL — ABNORMAL LOW (ref 3.5–5.2)
Alkaline Phosphatase: 183 U/L — ABNORMAL HIGH (ref 39–117)
Anion gap: 8 (ref 5–15)
BILIRUBIN TOTAL: 1.8 mg/dL — AB (ref 0.3–1.2)
BUN: 27 mg/dL — ABNORMAL HIGH (ref 6–23)
CHLORIDE: 96 mmol/L (ref 96–112)
CO2: 35 mmol/L — ABNORMAL HIGH (ref 19–32)
Calcium: 8.6 mg/dL (ref 8.4–10.5)
Creatinine, Ser: 1.24 mg/dL — ABNORMAL HIGH (ref 0.50–1.10)
GFR calc Af Amer: 51 mL/min — ABNORMAL LOW (ref >90)
GFR calc non Af Amer: 44 mL/min — ABNORMAL LOW (ref >90)
Glucose, Bld: 114 mg/dL — ABNORMAL HIGH (ref 70–99)
POTASSIUM: 3.2 mmol/L — AB (ref 3.5–5.1)
Sodium: 139 mmol/L (ref 135–145)
Total Protein: 4.8 g/dL — ABNORMAL LOW (ref 6.0–8.3)

## 2014-09-03 MED ORDER — POTASSIUM CHLORIDE CRYS ER 20 MEQ PO TBCR
40.0000 meq | EXTENDED_RELEASE_TABLET | Freq: Once | ORAL | Status: AC
  Administered 2014-09-03: 40 meq via ORAL
  Filled 2014-09-03: qty 2

## 2014-09-03 MED ORDER — OXYCODONE HCL 5 MG PO TABS
5.0000 mg | ORAL_TABLET | Freq: Once | ORAL | Status: AC
  Administered 2014-09-04: 5 mg via ORAL
  Filled 2014-09-03: qty 1

## 2014-09-03 NOTE — Progress Notes (Signed)
Patient Name: Cheyenne Riggs Date of Encounter: 09/03/2014     Active Problems:   Renal failure   Acute renal failure syndrome   Transaminitis   Elevated liver function tests   Weakness   ARF (acute renal failure)   Thrombocytopenia   Palliative care encounter   Neuropathic pain of both feet    SUBJECTIVE  No new cardiac symptoms.  Tolerated physical therapy yesterday.  Rhythm remains normal sinus rhythm on low-dose beta blocker  CURRENT MEDS . antiseptic oral rinse  7 mL Mouth Rinse q12n4p  . cefTRIAXone (ROCEPHIN)  IV  1 g Intravenous Q24H  . chlorhexidine  15 mL Mouth Rinse BID  . furosemide  40 mg Oral Daily  . gabapentin  100 mg Oral BID  . metoprolol tartrate  25 mg Oral BID  . oxyCODONE  5 mg Oral Once  . sodium chloride  3 mL Intravenous Q12H    OBJECTIVE  Filed Vitals:   09/02/14 1103 09/02/14 1513 09/02/14 2053 09/03/14 0423  BP: 144/66 103/87 126/67 117/64  Pulse: 67 68 70 66  Temp: 97.8 F (36.6 C) 97.3 F (36.3 C) 97.2 F (36.2 C) 97.6 F (36.4 C)  TempSrc: Axillary Oral Oral Oral  Resp: 15 20 20 20   Height:      Weight:    197 lb 14.4 oz (89.767 kg)  SpO2: 100% 100% 100% 94%    Intake/Output Summary (Last 24 hours) at 09/03/14 0815 Last data filed at 09/03/14 0703  Gross per 24 hour  Intake    190 ml  Output    150 ml  Net     40 ml   Filed Weights   09/01/14 0500 09/02/14 0400 09/03/14 0423  Weight: 198 lb 13.7 oz (90.2 kg) 197 lb 8.5 oz (89.6 kg) 197 lb 14.4 oz (89.767 kg)    PHYSICAL EXAM  General: Pleasant, NAD. Neuro: Alert and oriented X 3. Moves all extremities spontaneously. Psych: Normal affect. HEENT:  Normal  Neck: Supple without bruits or JVD. Lungs:  Resp regular and unlabored, CTA. Heart: RRR no s3, s4, or murmurs. Abdomen: Soft, non-tender, non-distended, BS + x 4.  Extremities: She has mottling of her toes.  Weak pedal pulses.  No pitting edema.  Accessory Clinical Findings  CBC  Recent Labs  09/02/14 0415  09/03/14 0511  WBC 7.2 5.8  HGB 12.2 11.8*  HCT 38.6 37.2  MCV 91.0 90.5  PLT 41* 37*   Basic Metabolic Panel  Recent Labs  09/02/14 0415 09/03/14 0511  NA 139 139  K 3.9 3.2*  CL 98 96  CO2 32 35*  GLUCOSE 105* 114*  BUN 30* 27*  CREATININE 1.42* 1.24*  CALCIUM 8.8 8.6   Liver Function Tests  Recent Labs  09/02/14 0415 09/03/14 0511  AST 97* 77*  ALT 62* 47*  ALKPHOS 213* 183*  BILITOT 1.8* 1.8*  PROT 5.1* 4.8*  ALBUMIN 2.5* 2.4*   No results for input(s): LIPASE, AMYLASE in the last 72 hours. Cardiac Enzymes  Recent Labs  09/01/14 0910  CKTOTAL 658*   BNP Invalid input(s): POCBNP D-Dimer No results for input(s): DDIMER in the last 72 hours. Hemoglobin A1C No results for input(s): HGBA1C in the last 72 hours. Fasting Lipid Panel No results for input(s): CHOL, HDL, LDLCALC, TRIG, CHOLHDL, LDLDIRECT in the last 72 hours. Thyroid Function Tests No results for input(s): TSH, T4TOTAL, T3FREE, THYROIDAB in the last 72 hours.  Invalid input(s): FREET3  TELE  Currently normal sinus  rhythm  ECG    Radiology/Studies  Ct Abdomen Pelvis Wo Contrast  08/26/2014   CLINICAL DATA:  Weakness and dizziness.  EXAM: CT ABDOMEN AND PELVIS WITHOUT CONTRAST  TECHNIQUE: Multidetector CT imaging of the abdomen and pelvis was performed following the standard protocol without IV contrast.  COMPARISON:  09/22/2009  FINDINGS: There are large bilateral pleural effusions, right larger than left. These later dependently an or associated with dependent pulmonary atelectasis. The heart is enlarged. No significant pericardial fluid is seen.  The liver has a normal appearance without contrast. There has been previous cholecystectomy. The spleen is normal. The pancreas is normal. The adrenal glands show hypertrophic change. The right kidney is normal. The left kidney is atrophic and contains several stones in the lower pole. No sign of active obstruction. There is atherosclerosis of  the aorta. The IVC is normal. No retroperitoneal mass or lymphadenopathy. No ascites. No bowel obstruction. Tiny amount of free fluid in the pelvis. There is diverticulosis of the sigmoid colon. Low level diverticulitis could not be excluded but there is no pronounced diverticulitis. There has been previous hysterectomy. There chronic degenerative changes of the spine.  IMPRESSION: Large pleural effusions, right larger than left. Dependent pulmonary atelectasis.  Previous cholecystectomy and hysterectomy.  Left renal atrophy with nonobstructing renal stones in the lower pole.  Atherosclerosis of the aorta and its branch vessels.  Diverticulosis. Small amount of free fluid in the pelvis. Diverticulitis is not confidently demonstrated, though low level diverticulitis could be in apparent by imaging.   Electronically Signed   By: Nelson Chimes M.D.   On: 08/26/2014 16:17   Dg Chest 2 View  08/26/2014   CLINICAL DATA:  Weakness and dizziness. Currently undergoing chemotherapy for right upper outer quadrant breast carcinoma.  EXAM: CHEST  2 VIEW  COMPARISON:  08/17/2014  FINDINGS: Moderate cardiomegaly remains stable. Right-sided Port-A-Cath remains in appropriate position.  Decrease in diffuse interstitial infiltrates seen since previous study, consistent with decreased interstitial edema or pneumonitis. No evidence of pulmonary consolidation. Probable tiny layering bilateral pleural effusions noted on lateral projection.  IMPRESSION: Resolving diffuse interstitial infiltrates. Stable cardiomegaly and persistent small layering bilateral pleural effusions.   Electronically Signed   By: Earle Gell M.D.   On: 08/26/2014 13:42   US Renal  08/28/2014   CLINICAL DATA:  Acute renal failure.  EXAM: RENAL/URINARY TRACT ULTRASOUND COMPLETE  COMPARISON:  CT abdomen and pelvis August 26, 2014  FINDINGS: Right Kidney:  Length: 10.6 cm. Echogenicity and renal cortical thickness are within normal limits. No mass, perinephric  fluid, or hydronephrosis visualized. No sonographically demonstrable calculus or ureterectasis.  Left Kidney:  Length: 7.8 cm. The left kidney is atrophic with areas of scarring. There are several lower pole region calculi, largest measuring 9 mm. There is no demonstrable mass, hydronephrosis, or perinephric fluid. No ureterectasis.  Bladder:  Appears normal for degree of bladder distention.  IMPRESSION: Comparatively atrophic left kidney with lower pole calculi. Appearance is commensurate with recent CT. There is no mass or hydronephrosis on either side. No perinephric fluid collections are appreciable. Right kidney appears normal by ultrasound.   Electronically Signed   By: Lowella Grip III M.D.   On: 08/28/2014 16:19   [place] Art/ven Flow Abd Pelv Doppler  08/29/2014   CLINICAL DATA:  69 year old female with acute renal failure  EXAM: DUPLEX ULTRASOUND OF LIVER  TECHNIQUE: Color and duplex Doppler ultrasound was performed to evaluate the hepatic in-flow and out-flow vessels.  COMPARISON:  Renal ultrasound  08/28/2014; CT abdomen/ pelvis 08/26/2014  FINDINGS: Portal Vein Velocities  Main:  18-27 cm/sec with normal hepatopetal flow.  Right:  Unable to visualize  Left:  Unable to visualize  Hepatic Vein Velocities  Right:  33 cm/sec  Middle:  38 cm/sec  Left:  29 cm/sec  Hepatic Artery Velocity:  69 cm/sec  Splenic Vein Velocity:  20 cm/sec, difficult to visualize.  Varices: None visualized.  Ascites: None.  Bilateral right larger than left pleural effusions. The liver is echogenic suggesting fatty infiltration.  IMPRESSION: 1. Main portal vein is patent with normal hepatopetal flow. Intrahepatic portal veins are difficult image secondary to a combination of hepatic steatosis and patient body habitus. Given the unremarkable appearance of the main portal vein, significant intrahepatic portal venous thrombosis is considered somewhat unlikely. 2. Hepatic steatosis. 3. Hepatic veins remain patent. 4. Right larger  than left pleural effusions.   Electronically Signed   By: Jacqulynn Cadet M.D.   On: 08/29/2014 13:45   Dg Chest Port 1 View  08/28/2014   CLINICAL DATA:  Central line placement.  EXAM: PORTABLE CHEST - 1 VIEW  COMPARISON:  08/26/2014  FINDINGS: New left internal jugular central venous catheter tip in the proximal SVC. There is no pneumothorax. The right chest port remains in place, tip in the SVC. Cardiomegaly is unchanged. Hazy opacity at both lung bases, left greater than right, likely effusions and not significantly changed allowing for differences in technique. Stable prominence of central pulmonary vasculature. No new airspace disease.  IMPRESSION: Tip of the left internal jugular central venous catheter in the SVC. No pneumothorax.   Electronically Signed   By: Jeb Levering M.D.   On: 08/28/2014 18:17   Dg Chest Port 1 View  08/17/2014   CLINICAL DATA:  Nausea, vomiting, weakness and diarrhea. History of breast cancer, on chemotherapy.  EXAM: PORTABLE CHEST - 1 VIEW  COMPARISON:  Chest radiograph June 30, 2014  FINDINGS: The cardiac silhouette is moderately enlarged, increased from prior examination. Pulmonary vascular congestion mild interstitial prominence with small LEFT pleural effusion. Patchy retrocardiac airspace opacity suggested. No pneumothorax. Single lumen RIGHT chest Port-A-Cath with distal tip projecting in proximal superior vena cava. Surgical clips in RIGHT chest.  IMPRESSION: Worsening moderate cardiomegaly with pulmonary vascular congestion interstitial prominence most consistent with pulmonary edema, confluent in the LEFT lung base with small LEFT pleural effusion.   Electronically Signed   By: Elon Alas   On: 08/17/2014 06:10    ASSESSMENT AND PLAN 1.  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  Currently reasonably stable on metoprolol 25 mg twice a day 2.  Cardiomyopathy with ejection fraction 35% down from 60% in February 2016 3.   Rhabdomyolysis and acute renal failure, improving 4.  Thrombocytopenia with purpura of toes 5.  Breast cancer  Plan: Will continue current metoprolol 25 mg twice a day.  Increase activity as tolerated.  Patient states that she is planning to go to an assisted living facility after discharge.  We will sign off now.  Please call for questions.   Signed, Darlin Coco MD

## 2014-09-03 NOTE — Clinical Social Work Placement (Addendum)
Patient has accepted SNF bed at [facility].   CLINICAL SOCIAL WORK PLACEMENT  NOTE  Date:  09/03/2014  Patient Details  Name: Cheyenne Riggs MRN: [id number] Date of Birth: 1946-04-24  Clinical Social Work is seeking post-discharge placement for this patient at the [facility] level of care (*CSW will initial, date and re-position this form in  chart as items are completed):  Yes   Patient/family provided with [place] Work Department's list of facilities offering this level of care within the geographic area requested by the patient (or if unable, by the patient's family).  Yes   Patient/family informed of their freedom to choose among providers that offer the needed level of care, that participate in Medicare, Medicaid or managed care program needed by the patient, have an available bed and are willing to accept the patient.  Yes   Patient/family informed of [place]'s ownership interest in [facility] and [facility], as well as of the fact that they are under no obligation to receive care at these facilities.  PASRR submitted to EDS on 09/03/14     PASRR number received on 09/03/14     Existing PASRR number confirmed on       FL2 transmitted to all facilities in geographic area requested by pt/family on 09/03/14     FL2 transmitted to all facilities within larger geographic area on       Patient informed that his/her managed care company has contracts with or will negotiate with certain facilities, including the following:        Yes   Patient/family informed of bed offers received.  Patient chooses bed at [facility] and [place] recommends and patient chooses bed at      Patient to be transferred to [facility] on  .  Patient to be transferred to facility by       Patient family notified on   of transfer.  Name of family member notified:        PHYSICIAN       Additional Comment:       Raynaldo Opitz, [place] Worker cell #: [phone]    _______________________________________________ Standley Brooking, LCSW 09/03/2014, 3:50 PM

## 2014-09-03 NOTE — Progress Notes (Signed)
VASCULAR LAB PRELIMINARY  ARTERIAL  ABI completed:    RIGHT    LEFT    PRESSURE WAVEFORM  PRESSURE WAVEFORM  BRACHIAL Restricted Limb Triphasic BRACHIAL 130 Truohasic  DP Not audible  DP 135 Monophasic  AT 89 Damped Monophasic AT    PT 169 Biphasic PT Not audible   GREAT TOE  Absent GREAT TOE  Absent    RIGHT LEFT  ABI 1.53 1.04   ABIs limited from pressures available. Right indicates normal arterial flow at the PT . Pressures however may be elevated due to calcification which would adversely effect the results. Left ABI indicates a normal arterial flow however Doppler waveforms might suggest a false elevation due to calcification. Great toes pressures could not be obtained due to absence of Doppler waveforms  Cheyenne Riggs, RVS 09/03/2014, 11:50 AM

## 2014-09-03 NOTE — Progress Notes (Signed)
NUTRITION FOLLOW-UP  DOCUMENTATION CODES Per approved criteria  -Obesity Unspecified   INTERVENTION: - Continue current diet - Will monitor for need for supplements - Encourage PO intakes and adequate hydration  NUTRITION DIAGNOSIS: Inadequate oral intake related to poor appetite as evidenced by poor PO intake <25%. -ongoing mainly  Goal: Pt to meet >/= 90% of their estimated nutrition needs -unmet  Monitor:  Meal intakes, need for supplements, weight, labs, I/O's  Admitting Dx: Generalized weakness   ASSESSMENT: 69 yo female with known breast cancer and currently undergoing Abraxane chemotherapy with chronic nausea since starting chemo, recent diagnosis of C. Diff and completed treatment with flagyl, discharged on April 6th, 2016 after being hospitalized for evaluation of wide complex tachycardia, elevated troponins, treated with beta blocker, acute on chronic combined systolic and diastolic CHF (last 2 D ECHO EF 35%) presented to [facility] ED with main concern of generalized weakness after taking dose of Ativan at home.  Pt transferred from ICU/SDU yesterday. Per rounds this AM, pt with renal failure, hx of breast cancer and refusing further chemotherapy treatments. Palliative care saw pt and per their note, pt not a candidate for hospice care at this time.   Pt has been eating less than 25% since last assessment and RN notes indicate meal refusals and poor nutrition. Pt reports for several weeks PTA she had a very poor appetite and was barely eating and mainly drinking ginger ale. She states that appetite has been returning the past few days but that she is still unable to eat much at any one time. She states that during chemotherapy treatment, she was having taste alteration with lack of taste and metallic taste but that since stopping chemo taste is starting to return. Chaplin and RD had entered pt's room at the same time so ended discussion so that chaplin could have time with pt.  Not  meeting needs; will talk about supplements at follow-up if able. Labs reviewed; K: 3.2 mmol/L, BUN/creatinine elevated but trending down, GFR: 44, LFTs elevated.  Height: Ht Readings from Last 1 Encounters:  08/26/14 5\' 3"  (1.6 m)    Weight: Wt Readings from Last 1 Encounters:  09/03/14 197 lb 14.4 oz (89.767 kg)     Wt Readings from Last 10 Encounters:  09/03/14 197 lb 14.4 oz (89.767 kg)  08/25/14 168 lb 4 oz (76.318 kg)  08/19/14 168 lb 4.8 oz (76.34 kg)  08/13/14 171 lb 14.4 oz (77.973 kg)  08/09/14 175 lb 1.6 oz (79.425 kg)  07/30/14 175 lb 1.6 oz (79.425 kg)  07/27/14 177 lb 9.6 oz (80.559 kg)  07/20/14 176 lb 11.2 oz (80.151 kg)  07/06/14 180 lb 3.2 oz (81.738 kg)  07/01/14 169 lb 4.8 oz (76.794 kg)     BMI:  Body mass index is 35.07 kg/(m^2).  Estimated Nutritional Needs: Kcal: 1800-2000 Protein: 80-90g Fluid: 1.8L/day  Skin: intact  Diet Order: Diet regular Room service appropriate?: Yes; Fluid consistency:: Thin    Intake/Output Summary (Last 24 hours) at 09/03/14 1054 Last data filed at 09/03/14 0703  Gross per 24 hour  Intake    170 ml  Output    150 ml  Net     20 ml    Last BM: 4/19  Labs:   Recent Labs Lab 08/29/14 0450  09/01/14 0910 09/02/14 0415 09/03/14 0511  NA 136  137  < > 137 139 139  K 4.4  4.3  < > 4.2 3.9 3.2*  CL 105  105  < >  97 98 96  CO2 21  20  < > 32 32 35*  BUN 39*  41*  < > 30* 30* 27*  CREATININE 2.80*  2.87*  < > 1.43* 1.42* 1.24*  CALCIUM 8.5  8.6  < > 8.7 8.8 8.6  PHOS 5.0*  --   --   --   --   GLUCOSE 164*  163*  < > 104* 105* 114*  < > = values in this interval not displayed.  CBG (last 3)  No results for input(s): GLUCAP in the last 72 hours.  Scheduled Meds: . antiseptic oral rinse  7 mL Mouth Rinse q12n4p  . cefTRIAXone (ROCEPHIN)  IV  1 g Intravenous Q24H  . chlorhexidine  15 mL Mouth Rinse BID  . furosemide  40 mg Oral Daily  . gabapentin  100 mg Oral BID  . metoprolol tartrate  25 mg  Oral BID  . oxyCODONE  5 mg Oral Once  . sodium chloride  3 mL Intravenous Q12H    Continuous Infusions:   Past Medical History  Diagnosis Date  . Anxiety   . Back pain   . H/O bladder problems   . Cholelithiasis   . Depression   . High blood pressure   . Hypercholesteremia   . Kidney stones   . Gum disease   . Complication of anesthesia     bp goes up  . Wears glasses   . Wears dentures     top  . Kidney stones   . Cancer   . Breast cancer     Past Surgical History  Procedure Laterality Date  . Colon biopsy    . Mouth surgery    . Spine surgery    . Colonoscopy    . Tubal ligation    . Lithotripsy    . Rhinoplasty    . Back surgery  1989    lumb lam  . Breast biopsy Right 02/05/2014    invasive ductal cncer  . Abdominal hysterectomy  1982  . Cholecystectomy  2011    lap choli  . Radioactive seed guided mastectomy with axillary sentinel lymph node biopsy Right 02/27/2014    Procedure: RADIOACTIVE SEED GUIDED RIGHT PARTIAL MASTECTOMY WITH AXILLARY SENTINEL LYMPH NODE BIOPSY;  Surgeon: Stark Klein, MD;  Location: [place];  Service: General;  Laterality: Right;  . Portacath placement N/A 02/27/2014    Procedure: INSERTION PORT-A-CATH;  Surgeon: Stark Klein, MD;  Location: [place];  Service: General;  Laterality: N/A;    Jarome Matin, RD, LDN Inpatient Clinical Dietitian Pager # [phone] After hours/weekend pager # [phone]

## 2014-09-03 NOTE — Progress Notes (Signed)
OT  Note  Patient Details Name: Cheyenne Riggs MRN: [id number] DOB: 09-Feb-1946   Cancelled Treatment:    Noted plan for SNF. Will defer OT eval to SNF Thanks, Kari Baars, OT [phone] Payton Mccallum D 09/03/2014, 9:27 AM

## 2014-09-03 NOTE — Progress Notes (Signed)
[facility] PROGRESS NOTE  Cheyenne Riggs ZOX:[id number] DOB: 1945-07-20 DOA: 08/26/2014  PCP: Gennette Pac, MD  Brief HPI: 69 year old Caucasian female with a past medical history of breast cancer on chemotherapy, presented with generalized weakness. She was found to have acute renal failure with severe transaminitis. During the course of the hospitalization, she developed hypotension with tachycardia and bradycardic events. She was transferred to the intensive care unit. She also was noted to have rhabdomyolysis and possibly acute drug-induced hepatitis. She was very slow to improve. She was transferred out of the stepdown unit on April 20. He was noted to have mottled toes.  Past medical history:  Past Medical History  Diagnosis Date  . Anxiety   . Back pain   . H/O bladder problems   . Cholelithiasis   . Depression   . High blood pressure   . Hypercholesteremia   . Kidney stones   . Gum disease   . Complication of anesthesia     bp goes up  . Wears glasses   . Wears dentures     top  . Kidney stones   . Cancer   . Breast cancer     Major events since admission: 4/15 - sudden hypotension with SVT requiring transfer to stepdown unit, central line placed, required pressors, amiodarone drip 4/16 - blood pressure stable, off pressors, LFTs and renal function improving 4/17 - intermittent episodes of SVT's wide complex, pt asymptomatic, Plt count dropping and oncology consulted, stopped amiodarone  4/18 - still with intermittent SVT's, right foot toe ischemic, pt with no chest pain this AM, cardiology consulted  4/20 - transfer to telemetry.  Consultants:  PCCM - signed off 4/16 Oncology  Cardiology Palliative medicine Vascular surgery  Procedures: Central line placement in the left IJ on April 15. To be removed April 21  Antibiotics: Rocephin 4/14 --> 4/20  Subjective: Patient continues to feel better. However, continues to have some pain in her toes.  Tolerating her diet. Is concerned about her mottled toes.   Objective: Vital Signs  Filed Vitals:   09/02/14 1103 09/02/14 1513 09/02/14 2053 09/03/14 0423  BP: 144/66 103/87 126/67 117/64  Pulse: 67 68 70 66  Temp: 97.8 F (36.6 C) 97.3 F (36.3 C) 97.2 F (36.2 C) 97.6 F (36.4 C)  TempSrc: Axillary Oral Oral Oral  Resp: 15 20 20 20   Height:      Weight:    89.767 kg (197 lb 14.4 oz)  SpO2: 100% 100% 100% 94%    Intake/Output Summary (Last 24 hours) at 09/03/14 1011 Last data filed at 09/03/14 0703  Gross per 24 hour  Intake    170 ml  Output    150 ml  Net     20 ml   Filed Weights   09/01/14 0500 09/02/14 0400 09/03/14 0423  Weight: 90.2 kg (198 lb 13.7 oz) 89.6 kg (197 lb 8.5 oz) 89.767 kg (197 lb 14.4 oz)    General appearance: alert, cooperative, appears stated age and no distress Resp: Decreased air entry at the bases without any wheezing, rales or rhonchi. Cardio: regular rate and rhythm, S1, S2 normal, no murmur, click, rub or gallop GI: soft, non-tender; bowel sounds normal; no masses,  no organomegaly Extremities: Bluish discoloration noted in Toes bilaterally. Poor pedal pulses. Neurologic: No focal deficits  Lab Results:  Basic Metabolic Panel:  Recent Labs Lab 08/29/14 0450  08/31/14 0540 09/01/14 0400 09/01/14 0910 09/02/14 0415 09/03/14 0511  NA 136  137  < >  134* 137 137 139 139  K 4.4  4.3  < > 3.6 4.1 4.2 3.9 3.2*  CL 105  105  < > 97 97 97 98 96  CO2 21  20  < > 29 31 32 32 35*  GLUCOSE 164*  163*  < > 141* 107* 104* 105* 114*  BUN 39*  41*  < > 32* 30* 30* 30* 27*  CREATININE 2.80*  2.87*  < > 1.59* 1.37* 1.43* 1.42* 1.24*  CALCIUM 8.5  8.6  < > 7.9* 8.5 8.7 8.8 8.6  PHOS 5.0*  --   --   --   --   --   --   < > = values in this interval not displayed. Liver Function Tests:  Recent Labs Lab 08/30/14 0515 08/31/14 0540 09/01/14 0910 09/02/14 0415 09/03/14 0511  AST 238* 150* 119* 97* 77*  ALT 121* 91* 75* 62* 47*    ALKPHOS 234* 206* 223* 213* 183*  BILITOT 2.1* 1.9* 2.0* 1.8* 1.8*  PROT 5.0* 4.9* 5.2* 5.1* 4.8*  ALBUMIN 2.6* 2.5* 2.7* 2.5* 2.4*    Recent Labs Lab 08/30/14 0515  AMMONIA 26   CBC:  Recent Labs Lab 08/30/14 0515 08/31/14 0540 09/01/14 0400 09/02/14 0415 09/03/14 0511  WBC 9.9 8.2 8.0 7.2 5.8  HGB 12.0 11.7* 11.8* 12.2 11.8*  HCT 37.1 36.7 37.1 38.6 37.2  MCV 88.1 89.1 89.4 91.0 90.5  PLT 29* 30* 30* 41* 37*   Cardiac Enzymes:  Recent Labs Lab 08/28/14 0450 08/29/14 0450 08/30/14 0515 08/31/14 0540 09/01/14 0910  CKTOTAL 3297* 2364* 1226* 918* 658*  CKMB 49.6*  --   --   --   --    BNP (last 3 results)  Recent Labs  06/29/14 1740 08/17/14 0955  BNP 534.8* 1980.9*     Recent Results (from the past 240 hour(s))  MRSA PCR Screening     Status: None   Collection Time: 08/28/14 10:23 AM  Result Value Ref Range Status   MRSA by PCR NEGATIVE NEGATIVE Final    Comment:        The GeneXpert MRSA Assay (FDA approved for NASAL specimens only), is one component of a comprehensive MRSA colonization surveillance program. It is not intended to diagnose MRSA infection nor to guide or monitor treatment for MRSA infections.   Clostridium Difficile by PCR     Status: None   Collection Time: 08/28/14  8:15 PM  Result Value Ref Range Status   C difficile by pcr NEGATIVE NEGATIVE Final  Culture, Urine     Status: None   Collection Time: 08/30/14  1:28 PM  Result Value Ref Range Status   Specimen Description URINE, CLEAN CATCH  Final   Special Requests NONE  Final   Colony Count   Final    70,000 COLONIES/ML Performed at [facility]    Culture   Final    Multiple bacterial morphotypes present, none predominant. Suggest appropriate recollection if clinically indicated. Performed at [facility]    Report Status 08/31/2014 FINAL  Final      Studies/Results: No results found.  Medications:  Scheduled: . antiseptic oral rinse  7 mL  Mouth Rinse q12n4p  . cefTRIAXone (ROCEPHIN)  IV  1 g Intravenous Q24H  . chlorhexidine  15 mL Mouth Rinse BID  . furosemide  40 mg Oral Daily  . gabapentin  100 mg Oral BID  . metoprolol tartrate  25 mg Oral BID  . oxyCODONE  5  mg Oral Once  . potassium chloride  40 mEq Oral Once  . sodium chloride  3 mL Intravenous Q12H   Continuous:  SAY:[id number], metoprolol, morphine injection, ondansetron **OR** ondansetron (ZOFRAN) IV, sodium chloride  Assessment/Plan:  Active Problems:   Renal failure   Acute renal failure syndrome   Transaminitis   Elevated liver function tests   Weakness   ARF (acute renal failure)   Thrombocytopenia   Palliative care encounter   Neuropathic pain of both feet    Acute transaminitis secondary to drug induced hepatotoxicity, ? Ischemic hepatitis  - CT abd with no specific acute finding to explain transaminitis  - thought to be secondary to drug induced hepatotoxicity and rhabdomyolysis per GI doctor Dr. Watt Climes  - unlikely related to CHF as LFT got worse on Lasix and finally started to trend down with IVF c/w acute hepatotoxicity, drug induced  - Please note that patient initially denied taking Tylenol more than prescribed - upon further questioning 4/15, patient admitted to taking double to triple doses of tylenol for back pain and headaches - please note that Dr. Watt Climes recommended IVF and supportive care avoiding hepatotoxic medications  - Liver tests still elevated but continue improving and trending down  - Tylenol level was undetectable - avoiding hepatotoxic medications   Acute on chronic renal failure stage III - last known Cr 1.13 on last admission, creatinine overall trending down.   - with elevated CK level on admission, this was thought to be secondary to rhabdomyolysis, progression from prerenal etiology to ATN, ? CIN (pt has received contrast for recent CT abd) - Systolic CHF with EF 22% precluded use of aggressive IV fluid  hydration but pt had been receiving IVF NS at 75 cc/hr until 4/18 - Renal ultrasound 08/28/2014 with atrophic left kidney, unremarkable right kidney, no signs of hydronephrosis - Continue to monitor intake and output  Right foot toes mottling  - Mottling appears to be increasing. This is concerning. Discussed with her oncologist. Chemotherapy is unlikely contributing to this. We will get arterial Dopplers and will consult vascular surgery. Discussed with Dr. Scot Dock who will see patient later today.  SVT with hypotension, 08/28/2014 - Unclear of triggering event, required transfer to stepdown unit 4/15, with intermittent tachy - bradycardia episodes  - pt off pressors since 4/16 but with intermittent episodes of SVTs and paroxysmal a-fib overnight 4/16, 4/17, 4/18  - pt started on amiodarone drip 4/15 but given its hepatotoxicity, pt quickly tapered off amio  - continue metoprolol per home medical regimen 25 mg by mouth twice a day and metoprolol as needed  - cardiology has been following. Patient has remained stable. No further episodes of SVT noted.  Metabolic acidosis, resolved - in the setting of acute on chronic renal failure and rhabdomyolysis and progression to ATN - resolved with Bicarb, off bicarb since 4/17  Rhabdomyolysis - Patient responded well to IV fluids, CK level continue trending down but now volume overloaded and weight up from 175 --> 198 lbs  - off IVF since 4/18   Acute on chronic respiratory failure secondary to acute on chronic systolic and diastolic CHF, with hypoxia (O2 sat 89% on RA) - last 2 D ECHO with EF 35%, known and persistent right side pleural effusion  - weight on admission 175 lbs --> 198 lbs on 4/19, from IVF pt has required for tx of rhabdo, ARF, acute hepatitis as noted above  - resumed lasix per home medical regimen 4/19 - close monitoring of volume  status  - monitor daily weights, I's and O's - no ACEI in the setting or renal failure  -  keep on [place] O2  Leukocytosis secondary to UTI 4/14 - she has completed 7 days of ceftriaxone. - WBC is now WNL - urine culture with multiple morphotypes present  Acute urinary retention on admission  - renal US with no signs of hydronephrosis. Foley has been removed.  Essential HTN - blood pressure stable.  Breast cancer - Oncology has been following - Patient's oncologist is aware that pt is no longer wanting chemo. Seen by palliative medicine. Their input is appreciated.  Severe PCM - in the context of chronic illness. Oral intake is improving.  Thrombocytopenia - unclear etiology. Counts are stable - appreciate oncologist recommendations  - avoiding all heparin products as recommended by oncologist    DVT Prophylaxis: SCDs    Code Status: Full code  Family Communication: Discussed with the patient  Disposition Plan: Await vascular surgery input. Plan is for eventual discharge to skilled nursing facility when medically improved.  Follow-up Appointment?: Will need follow-up with oncology and PCP.   LOS: 8 days   [facility] Pager [phone] 09/03/2014, 10:11 AM  If 7PM-7AM, please contact night-coverage at [email or web address], password [facility]

## 2014-09-03 NOTE — Progress Notes (Signed)
Per MD order, central line removed. IV cathter intact. Vaseline pressure gauze to site, pressure held x 5 min, no bleeding to site. Pt instructed not to get out of bed for 30 min after the removal of the central line. Instucted to keep dressing CDI x 24hours, if bleeding occurs hold pressure and contact the nurse. Pt verbalized understanding and did not have any questions. [employer]

## 2014-09-03 NOTE — Consult Note (Signed)
[facility] [place]  Patient name: CARIZMA DUNSWORTH MRN: [id number] DOB: 1946-01-19 Sex: female  REASON FOR CONSULT: ischemic toes. Consult is from [facility]  HPI: CARIS CERVENY is a 69 y.o. female with a history of breast cancer. She has been undergoing chemotherapy and was admitted with fatigue several days ago. She apparently had decided to stop chemotherapy as she could not tolerate this any further. During the admission she was noted to have some ischemic toes and vascular surgery was consult.  She describes having some pain in her legs associated with sciatica but I did not get any history of claudication or rest pain prior to this admission. He has no previous history of nonhealing ulcers.  Her risk factors for peripheral vascular disease include hypertension and history of tobacco use. She denies any history of diabetes, hypercholesterolemia, or family history of premature cardiovascular disease.  Past Medical History  Diagnosis Date  . Anxiety   . Back pain   . H/O bladder problems   . Cholelithiasis   . Depression   . High blood pressure   . Hypercholesteremia   . Kidney stones   . Gum disease   . Complication of anesthesia     bp goes up  . Wears glasses   . Wears dentures     top  . Kidney stones   . Cancer   . Breast cancer     Family History  Problem Relation Age of Onset  . Stroke Mother     onset in her 11's  . Cancer - Other Father     eye cancer, black lung diseae  . Heart failure Sister   . Hypertension Brother     SOCIAL HISTORY: History  Substance Use Topics  . Smoking status: Former Smoker -- 0.00 packs/day    Quit date: 05/16/2014  . Smokeless tobacco: Not on file  . Alcohol Use: No    Allergies  Allergen Reactions  . Ambien [Zolpidem Tartrate] Other (See Comments)    Per Pt: causes sleepwalking, makes loopy.  . Ciprofloxacin Nausea And Vomiting  . Demerol [Meperidine] Hives  . Lidocaine     palpatations    . Other Swelling    Eye ointments  . Oxycodone Other (See Comments)    Pt states that it makes her feel "loopy and crazy"  . Septra [Sulfamethoxazole-Trimethoprim] Nausea And Vomiting  . Codeine Rash  . Novocain [Procaine] Palpitations  . Vancomycin Rash    Current Facility-Administered Medications  Medication Dose Route Frequency Provider Last Rate Last Dose  . antiseptic oral rinse (CPC / CETYLPYRIDINIUM CHLORIDE 0.05%) solution 7 mL  7 mL Mouth Rinse q12n4p Theodis Blaze, MD   Stopped at 09/03/14 1124  . cefTRIAXone (ROCEPHIN) 1 g in dextrose 5 % 50 mL IVPB - Premix  1 g Intravenous Q24H Bonnielee Haff, MD   1 g at 09/02/14 1603  . chlorhexidine (PERIDEX) 0.12 % solution 15 mL  15 mL Mouth Rinse BID Theodis Blaze, MD   15 mL at 09/03/14 1027  . diphenhydrAMINE (BENADRYL) capsule 25 mg  25 mg Oral QHS PRN Theodis Blaze, MD   25 mg at 09/01/14 0111  . furosemide (LASIX) tablet 40 mg  40 mg Oral Daily Theodis Blaze, MD   40 mg at 09/03/14 1027  . gabapentin (NEURONTIN) capsule 100 mg  100 mg Oral BID Darrel Reach [place], DO   100 mg at 09/03/14 1027  . metoprolol (LOPRESSOR) injection  5 mg  5 mg Intravenous Q4H PRN Theodis Blaze, MD   5 mg at 08/29/14 0347  . metoprolol tartrate (LOPRESSOR) tablet 25 mg  25 mg Oral BID Theodis Blaze, MD   25 mg at 09/03/14 1027  . morphine 2 MG/ML injection 1 mg  1 mg Intravenous Q4H PRN Theodis Blaze, MD   1 mg at 09/03/14 0618  . ondansetron (ZOFRAN) tablet 4 mg  4 mg Oral Q6H PRN Theodis Blaze, MD       Or  . ondansetron [facility]) injection 4 mg  4 mg Intravenous Q6H PRN Theodis Blaze, MD   4 mg at 08/28/14 1041  . oxyCODONE (Oxy IR/ROXICODONE) immediate release tablet 5 mg  5 mg Oral Once Gardiner Barefoot, NP   5 mg at 09/03/14 0130  . sodium chloride 0.9 % injection 10-40 mL  10-40 mL Intracatheter PRN Bonnielee Haff, MD   10 mL at 09/03/14 0514  . sodium chloride 0.9 % injection 3 mL  3 mL Intravenous Q12H Theodis Blaze, MD   3 mL at 09/01/14 2106     REVIEW OF SYSTEMS: Valu.Nieves ] denotes positive finding; [  ] denotes negative finding CARDIOVASCULAR:  [ ]  chest pain   [ ]  chest pressure   [ ]  palpitations   [ ]  orthopnea   Valu.Nieves ] dyspnea on exertion   [ ]  claudication   [ ]  rest pain   [ ]  DVT   [ ]  phlebitis PULMONARY:   [ ]  productive cough   [ ]  asthma   [ ]  wheezing NEUROLOGIC:   [ ]  weakness  [ ]  paresthesias  [ ]  aphasia  [ ]  amaurosis  [ ]  dizziness HEMATOLOGIC:   [ ]  bleeding problems   [ ]  clotting disorders MUSCULOSKELETAL:  [ ]  joint pain   [ ]  joint swelling [ ]  leg swelling GASTROINTESTINAL: [ ]   blood in stool  [ ]   hematemesis GENITOURINARY:  [ ]   dysuria  [ ]   hematuria PSYCHIATRIC:  [ ]  history of major depression INTEGUMENTARY:  Valu.Nieves ] rashes  [ ]  ulcers CONSTITUTIONAL:  [ ]  fever   [ ]  chills  PHYSICAL EXAM: Filed Vitals:   09/02/14 1103 09/02/14 1513 09/02/14 2053 09/03/14 0423  BP: 144/66 103/87 126/67 117/64  Pulse: 67 68 70 66  Temp: 97.8 F (36.6 C) 97.3 F (36.3 C) 97.2 F (36.2 C) 97.6 F (36.4 C)  TempSrc: Axillary Oral Oral Oral  Resp: 15 20 20 20   Height:      Weight:    197 lb 14.4 oz (89.767 kg)  SpO2: 100% 100% 100% 94%   Body mass index is 35.07 kg/(m^2). GENERAL: The patient is a well-nourished female, in no acute distress. The vital signs are documented above. CARDIOVASCULAR: There is a regular rate and rhythm. I do not detect carotid bruits. She has palpable femoral pulses. She has a weakly palpable left popliteal pulse. I cannot palpate a right popliteal pulse. I cannot palpate pedal pulses. PULMONARY: There is good air exchange bilaterally without wheezing or rales. ABDOMEN: Soft and non-tender with normal pitched bowel sounds.  MUSCULOSKELETAL: There are no major deformities or cyanosis. NEUROLOGIC: No focal weakness or paresthesias are detected. SKIN: There is some mottling of the left thumb and left index finger distally. On the right foot the fourth and fifth toes have some bluish  discoloration. On the left foot the great toe is mottled and the 5th toe has  some mottling. PSYCHIATRIC: The patient has a normal affect.  DATA:  Lab Results  Component Value Date   WBC 5.8 09/03/2014   HGB 11.8* 09/03/2014   HCT 37.2 09/03/2014   MCV 90.5 09/03/2014   PLT 37* 09/03/2014   Lab Results  Component Value Date   NA 139 09/03/2014   K 3.2* 09/03/2014   CL 96 09/03/2014   CO2 35* 09/03/2014   Lab Results  Component Value Date   CREATININE 1.24* 09/03/2014   Lab Results  Component Value Date   INR 0.97 09/22/2009   Lab Results  Component Value Date   HGBA1C * 09/23/2009    6.4 (NOTE)                                                                       According to the ADA Clinical Practice Recommendations for 2011, when HbA1c is used as a screening test:   >=6.5%   Diagnostic of Diabetes Mellitus           (if abnormal result  is confirmed)  5.7-6.4%   Increased risk of developing Diabetes Mellitus  References:Diagnosis and Classification of Diabetes Mellitus,Diabetes FKCL,2751,70(YFVCB 1):S62-S69 and Standards of Medical Care in         Diabetes - 2011,Diabetes Care,2011,34  (Suppl 1):S11-S61.   CT ABDOMEN AND PELVIS WITHOUT CONTRAST:  I did review her CT of the abdomen and pelvis which does show some calcific disease in her aorta and iliac arteries suggesting some atherosclerotic disease. This could potentially be a source of embolization although the study was done without contrast.  I have independently interpreted her arterial Doppler study. This shows an ABI of 100 percent bilaterally although this may be falsely elevated because of calcific disease. On the right side she has a monophasic anterior tibial signal and a biphasic posterior tibial signal. On the left side she has a monophasic dorsalis pedis signal.  Her platelet count is 37,000. Her creatinine is 1.24.  MEDICAL ISSUES:  ISCHEMIC TOES: This patient has some mottling of the left thumb and index  finger distally, the right fourth and fifth toes, and the left great toe and fifth toe. This could potentially be related to her thrombocytopenia. In addition she has a history of tachybradycardia syndrome and paroxysmal atrial fibrillation. 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 Thus she has multiple potential sources for microembolization to her toes. However, currently I would not recommend an aggressive workup. She has some renal insufficiency and would not be a good candidate for arteriography as visualization with CO2 would be somewhat limited. In addition she is significantly debilitated with multiple medical issues. I have given her my card and I will  Be happy to see her as an outpatient in the future if her ischemic changes progress. However this point I would just recommend supportive care. I do not think that she's had a major embolic event.  [place] [facility] [place] Beeper: [phone]

## 2014-09-04 ENCOUNTER — Ambulatory Visit (HOSPITAL_COMMUNITY): Admission: RE | Admit: 2014-09-04 | Payer: Medicare Other | Source: Ambulatory Visit

## 2014-09-04 DIAGNOSIS — I75023 Atheroembolism of bilateral lower extremities: Secondary | ICD-10-CM | POA: Diagnosis not present

## 2014-09-04 DIAGNOSIS — R2681 Unsteadiness on feet: Secondary | ICD-10-CM | POA: Diagnosis not present

## 2014-09-04 DIAGNOSIS — I480 Paroxysmal atrial fibrillation: Secondary | ICD-10-CM | POA: Diagnosis not present

## 2014-09-04 DIAGNOSIS — Z87891 Personal history of nicotine dependence: Secondary | ICD-10-CM | POA: Diagnosis not present

## 2014-09-04 DIAGNOSIS — L03031 Cellulitis of right toe: Secondary | ICD-10-CM | POA: Diagnosis not present

## 2014-09-04 DIAGNOSIS — E780 Pure hypercholesterolemia: Secondary | ICD-10-CM | POA: Diagnosis not present

## 2014-09-04 DIAGNOSIS — Z881 Allergy status to other antibiotic agents status: Secondary | ICD-10-CM | POA: Diagnosis not present

## 2014-09-04 DIAGNOSIS — I10 Essential (primary) hypertension: Secondary | ICD-10-CM | POA: Diagnosis not present

## 2014-09-04 DIAGNOSIS — I70244 Atherosclerosis of native arteries of left leg with ulceration of heel and midfoot: Secondary | ICD-10-CM | POA: Diagnosis not present

## 2014-09-04 DIAGNOSIS — N189 Chronic kidney disease, unspecified: Secondary | ICD-10-CM | POA: Diagnosis not present

## 2014-09-04 DIAGNOSIS — I5043 Acute on chronic combined systolic (congestive) and diastolic (congestive) heart failure: Secondary | ICD-10-CM | POA: Diagnosis not present

## 2014-09-04 DIAGNOSIS — R7989 Other specified abnormal findings of blood chemistry: Secondary | ICD-10-CM | POA: Diagnosis not present

## 2014-09-04 DIAGNOSIS — I70245 Atherosclerosis of native arteries of left leg with ulceration of other part of foot: Secondary | ICD-10-CM | POA: Diagnosis not present

## 2014-09-04 DIAGNOSIS — N61 Inflammatory disorders of breast: Secondary | ICD-10-CM | POA: Diagnosis not present

## 2014-09-04 DIAGNOSIS — Z882 Allergy status to sulfonamides status: Secondary | ICD-10-CM | POA: Diagnosis not present

## 2014-09-04 DIAGNOSIS — L97529 Non-pressure chronic ulcer of other part of left foot with unspecified severity: Secondary | ICD-10-CM | POA: Diagnosis not present

## 2014-09-04 DIAGNOSIS — N183 Chronic kidney disease, stage 3 (moderate): Secondary | ICD-10-CM | POA: Diagnosis not present

## 2014-09-04 DIAGNOSIS — R488 Other symbolic dysfunctions: Secondary | ICD-10-CM | POA: Diagnosis not present

## 2014-09-04 DIAGNOSIS — I70235 Atherosclerosis of native arteries of right leg with ulceration of other part of foot: Secondary | ICD-10-CM | POA: Diagnosis not present

## 2014-09-04 DIAGNOSIS — D696 Thrombocytopenia, unspecified: Secondary | ICD-10-CM | POA: Diagnosis not present

## 2014-09-04 DIAGNOSIS — B029 Zoster without complications: Secondary | ICD-10-CM | POA: Diagnosis not present

## 2014-09-04 DIAGNOSIS — L97519 Non-pressure chronic ulcer of other part of right foot with unspecified severity: Secondary | ICD-10-CM | POA: Diagnosis not present

## 2014-09-04 DIAGNOSIS — E872 Acidosis: Secondary | ICD-10-CM | POA: Diagnosis not present

## 2014-09-04 DIAGNOSIS — Z888 Allergy status to other drugs, medicaments and biological substances status: Secondary | ICD-10-CM | POA: Diagnosis not present

## 2014-09-04 DIAGNOSIS — Z886 Allergy status to analgesic agent status: Secondary | ICD-10-CM | POA: Diagnosis not present

## 2014-09-04 DIAGNOSIS — L819 Disorder of pigmentation, unspecified: Secondary | ICD-10-CM | POA: Diagnosis not present

## 2014-09-04 DIAGNOSIS — N390 Urinary tract infection, site not specified: Secondary | ICD-10-CM | POA: Diagnosis not present

## 2014-09-04 DIAGNOSIS — M6281 Muscle weakness (generalized): Secondary | ICD-10-CM | POA: Diagnosis not present

## 2014-09-04 DIAGNOSIS — E873 Alkalosis: Secondary | ICD-10-CM | POA: Diagnosis not present

## 2014-09-04 DIAGNOSIS — N179 Acute kidney failure, unspecified: Secondary | ICD-10-CM | POA: Diagnosis not present

## 2014-09-04 DIAGNOSIS — F329 Major depressive disorder, single episode, unspecified: Secondary | ICD-10-CM | POA: Diagnosis not present

## 2014-09-04 DIAGNOSIS — I471 Supraventricular tachycardia: Secondary | ICD-10-CM | POA: Diagnosis not present

## 2014-09-04 DIAGNOSIS — I701 Atherosclerosis of renal artery: Secondary | ICD-10-CM | POA: Diagnosis not present

## 2014-09-04 DIAGNOSIS — Z9181 History of falling: Secondary | ICD-10-CM | POA: Diagnosis not present

## 2014-09-04 DIAGNOSIS — C50411 Malignant neoplasm of upper-outer quadrant of right female breast: Secondary | ICD-10-CM | POA: Diagnosis not present

## 2014-09-04 DIAGNOSIS — I129 Hypertensive chronic kidney disease with stage 1 through stage 4 chronic kidney disease, or unspecified chronic kidney disease: Secondary | ICD-10-CM | POA: Diagnosis not present

## 2014-09-04 DIAGNOSIS — Z87442 Personal history of urinary calculi: Secondary | ICD-10-CM | POA: Diagnosis not present

## 2014-09-04 DIAGNOSIS — G629 Polyneuropathy, unspecified: Secondary | ICD-10-CM | POA: Diagnosis not present

## 2014-09-04 DIAGNOSIS — C50919 Malignant neoplasm of unspecified site of unspecified female breast: Secondary | ICD-10-CM | POA: Diagnosis not present

## 2014-09-04 DIAGNOSIS — Z7901 Long term (current) use of anticoagulants: Secondary | ICD-10-CM | POA: Diagnosis not present

## 2014-09-04 DIAGNOSIS — F419 Anxiety disorder, unspecified: Secondary | ICD-10-CM | POA: Diagnosis not present

## 2014-09-04 DIAGNOSIS — E869 Volume depletion, unspecified: Secondary | ICD-10-CM | POA: Diagnosis not present

## 2014-09-04 DIAGNOSIS — I5022 Chronic systolic (congestive) heart failure: Secondary | ICD-10-CM | POA: Diagnosis not present

## 2014-09-04 DIAGNOSIS — J9621 Acute and chronic respiratory failure with hypoxia: Secondary | ICD-10-CM | POA: Diagnosis not present

## 2014-09-04 DIAGNOSIS — I4891 Unspecified atrial fibrillation: Secondary | ICD-10-CM | POA: Diagnosis not present

## 2014-09-04 DIAGNOSIS — Z9049 Acquired absence of other specified parts of digestive tract: Secondary | ICD-10-CM | POA: Diagnosis not present

## 2014-09-04 MED ORDER — HYDROCODONE-ACETAMINOPHEN 5-325 MG PO TABS: 1.0000 | ORAL_TABLET | Freq: Four times a day (QID) | ORAL | Status: DC | PRN

## 2014-09-04 MED ORDER — POTASSIUM CHLORIDE ER 10 MEQ PO CPCR: 10.0000 meq | ORAL_CAPSULE | Freq: Every day | ORAL | Status: DC

## 2014-09-04 MED ORDER — HEPARIN SOD (PORK) LOCK FLUSH 100 UNIT/ML IV SOLN
500.0000 [IU] | INTRAVENOUS | Status: AC | PRN
  Administered 2014-09-04: 500 [IU]

## 2014-09-04 MED ORDER — FUROSEMIDE 40 MG PO TABS: 40.0000 mg | ORAL_TABLET | Freq: Every day | ORAL | Status: DC

## 2014-09-04 MED ORDER — GABAPENTIN 100 MG PO CAPS: 100.0000 mg | ORAL_CAPSULE | Freq: Two times a day (BID) | ORAL | Status: DC

## 2014-09-04 MED ORDER — LORAZEPAM 0.5 MG PO TABS: 0.5000 mg | ORAL_TABLET | Freq: Three times a day (TID) | ORAL | Status: DC | PRN

## 2014-09-04 NOTE — Discharge Summary (Signed)
[facility]  Physician Discharge Summary   Patient ID: Cheyenne Riggs MRN: [id number] DOB/AGE: [birth year] 69 y.o.  Admit date: 08/26/2014 Discharge date: 09/04/2014  PCP: Gennette Pac, MD  DISCHARGE DIAGNOSES:  Active Problems:   Renal failure   Acute renal failure syndrome   Transaminitis   Elevated liver function tests   Weakness   ARF (acute renal failure)   Thrombocytopenia   Palliative care encounter   Neuropathic pain of both feet   Discoloration of skin of toe   RECOMMENDATIONS FOR OUTPATIENT FOLLOW UP: 1. Monitor her toes closely. If her pain gets significantly worse, or discoloration. Continues to get worse. Please make appointment with Dr. Scot Dock with vascular surgery. 2. CBC and basic metabolic panel in 1 week. 3. Follow-up with oncology as discussed below.  DISCHARGE CONDITION: fair  Diet recommendation: Regular as tolerated  Filed Weights   09/02/14 0400 09/03/14 0423 09/04/14 0626  Weight: 89.6 kg (197 lb 8.5 oz) 89.767 kg (197 lb 14.4 oz) 87.408 kg (192 lb 11.2 oz)    INITIAL HISTORY: 69 year old Caucasian female with a past medical history of breast cancer on chemotherapy, presented with generalized weakness. She was found to have acute renal failure with severe transaminitis. During the course of the hospitalization, she developed hypotension with tachycardia and bradycardic events. She was transferred to the intensive care unit. She also was noted to have rhabdomyolysis and possibly acute drug-induced hepatitis. She was very slow to improve. She was transferred out of the stepdown unit on April 20. She was noted to have mottled toes.  Major events since admission: 4/15 - sudden hypotension with SVT requiring transfer to stepdown unit, central line placed, required pressors, amiodarone drip 4/16 - blood pressure stable, off pressors, LFTs and renal function improving 4/17 - intermittent episodes of SVT's wide complex, pt asymptomatic, Plt  count dropping and oncology consulted, stopped amiodarone  4/18 - still with intermittent SVT's, right foot toe ischemic, pt with no chest pain this AM, cardiology consulted  4/20 - transfer to telemetry.  Consultants:  PCCM - signed off 4/16 Oncology  Cardiology Palliative medicine Vascular surgery  Procedures: Central line placement in the left IJ on April 15. Removed April 21   HOSPITAL COURSE:   Acute transaminitis secondary to drug induced hepatotoxicity, ? Ischemic hepatitis  - CT abd with no specific acute finding to explain transaminitis  - thought to be secondary to drug induced hepatotoxicity and rhabdomyolysis per GI doctor Dr. Watt Climes  - unlikely related to CHF as LFT got worse on Lasix and finally started to trend down with IVF c/w acute hepatotoxicity, drug induced  - Please note that patient initially denied taking Tylenol more than prescribed - upon further questioning 4/15, patient admitted to taking double to triple doses of tylenol for back pain and headaches. Tylenol level was undetectable - please note that Dr. Watt Climes recommended IVF and supportive care avoiding hepatotoxic medications  - Liver tests still elevated but continue improving and trending down. Okay to resume Tylenol as needed for pain issues.  Acute on chronic renal failure stage III - With elevated CK level on admission, this was thought to be secondary to rhabdomyolysis, progression from prerenal etiology to ATN, ? CIN (pt has received contrast for recent CT abd) - Systolic CHF with EF 32% precluded use of aggressive IV fluid hydration but pt had been receiving IVF NS at 75 cc/hr until 4/18 - Renal ultrasound 08/28/2014 with atrophic left kidney, unremarkable right kidney, no signs of hydronephrosis -  renal function has improved. Back to baseline. She is making urine.  Right foot toes mottling  Patient was noted to have mottling of her toes bilaterally. She was also noted to have mottling of  her fingertips. Arterial Doppler of the lower extremities did not suggest significant stenosis. She does have congestive heart failure, has thrombocytopenia and atrial fibrillation. She was seen by vascular surgery. They do not suspect a major embolic event. Due to her several comorbidities, patient is not a candidate for invasive testing. Pain control will be mainstay for now. If her pain gets worse and if the discolorations also starts getting worse, she will need to be seen by vascular surgery. For now, just supportive treatment to be given.  SVT with hypotension, 08/28/2014 Unclear what triggered this episode. She was seen by cardiology. Initially she was placed on amiodarone, but this was discontinued due to her liver abnormalities. She was started on beta blocker. She hasn't had any further episodes of SVT.  Metabolic acidosis, resolved This was in the setting of acute on chronic renal failure and rhabdomyolysis and progression to ATN. Resolved with Bicarb, off bicarb since 4/17  Rhabdomyolysis Patient responded well to IV fluids, CK level continue trending down  Acute on chronic respiratory failure secondary to acute on chronic systolic and diastolic CHF, with hypoxia (O2 sat 89% on RA) - last 2 D ECHO with EF 35%, known and persistent right side pleural effusion  - weight on admission 175 lbs --> 198 lbs on 4/19, from IVF pt has required for tx of rhabdo, ARF, acute hepatitis as noted above  - resumed lasix per home medical regimen 4/19 - continueclose monitoring of volume status  - monitor daily weights, I's and O's - no ACEI in the setting or renal failure  - keep on [place] O2  UTI with leukocytosis She has completed 7 days of ceftriaxone. WBC is now WNL. Urine culture with multiple morphotypes present  Acute urinary retention on admission  Renal US with no signs of hydronephrosis. Foley has been removed.  Essential HTN Blood pressure stable.  Breast cancer - Oncology has been  following - Patient's oncologist is aware that patient does not want any further chemotherapy. Patient was also seen by palliative medicine. Oncology should follow as outpatient.  Severe PCM This is in the context of chronic illness. Oral intake is improving.  Thrombocytopenia Etiology is unclear but possibly related to sepsis and all her comorbidities. Avoid the heparin products. Monitor counts as outpatient.  Patient remains stable. She is feeling stronger. Her appetite is better. She is okay for discharge to skilled nursing facility today.   PERTINENT LABS:  The results of significant diagnostics from this hospitalization (including imaging, microbiology, ancillary and laboratory) are listed below for reference.    Microbiology: Recent Results (from the past 240 hour(s))  MRSA PCR Screening     Status: None   Collection Time: 08/28/14 10:23 AM  Result Value Ref Range Status   MRSA by PCR NEGATIVE NEGATIVE Final    Comment:        The GeneXpert MRSA Assay (FDA approved for NASAL specimens only), is one component of a comprehensive MRSA colonization surveillance program. It is not intended to diagnose MRSA infection nor to guide or monitor treatment for MRSA infections.   Clostridium Difficile by PCR     Status: None   Collection Time: 08/28/14  8:15 PM  Result Value Ref Range Status   C difficile by pcr NEGATIVE NEGATIVE Final  Culture,  Urine     Status: None   Collection Time: 08/30/14  1:28 PM  Result Value Ref Range Status   Specimen Description URINE, CLEAN CATCH  Final   Special Requests NONE  Final   Colony Count   Final    70,000 COLONIES/ML Performed at [facility]    Culture   Final    Multiple bacterial morphotypes present, none predominant. Suggest appropriate recollection if clinically indicated. Performed at [facility]    Report Status 08/31/2014 FINAL  Final     Labs: Basic Metabolic Panel:  Recent Labs Lab 08/29/14 0450   08/31/14 0540 09/01/14 0400 09/01/14 0910 09/02/14 0415 09/03/14 0511  NA 136  137  < > 134* 137 137 139 139  K 4.4  4.3  < > 3.6 4.1 4.2 3.9 3.2*  CL 105  105  < > 97 97 97 98 96  CO2 21  20  < > 29 31 32 32 35*  GLUCOSE 164*  163*  < > 141* 107* 104* 105* 114*  BUN 39*  41*  < > 32* 30* 30* 30* 27*  CREATININE 2.80*  2.87*  < > 1.59* 1.37* 1.43* 1.42* 1.24*  CALCIUM 8.5  8.6  < > 7.9* 8.5 8.7 8.8 8.6  PHOS 5.0*  --   --   --   --   --   --   < > = values in this interval not displayed. Liver Function Tests:  Recent Labs Lab 08/30/14 0515 08/31/14 0540 09/01/14 0910 09/02/14 0415 09/03/14 0511  AST 238* 150* 119* 97* 77*  ALT 121* 91* 75* 62* 47*  ALKPHOS 234* 206* 223* 213* 183*  BILITOT 2.1* 1.9* 2.0* 1.8* 1.8*  PROT 5.0* 4.9* 5.2* 5.1* 4.8*  ALBUMIN 2.6* 2.5* 2.7* 2.5* 2.4*   Recent Labs Lab 08/30/14 0515  AMMONIA 26   CBC:  Recent Labs Lab 08/30/14 0515 08/31/14 0540 09/01/14 0400 09/02/14 0415 09/03/14 0511  WBC 9.9 8.2 8.0 7.2 5.8  HGB 12.0 11.7* 11.8* 12.2 11.8*  HCT 37.1 36.7 37.1 38.6 37.2  MCV 88.1 89.1 89.4 91.0 90.5  PLT 29* 30* 30* 41* 37*   Cardiac Enzymes:  Recent Labs Lab 08/29/14 0450 08/30/14 0515 08/31/14 0540 09/01/14 0910  CKTOTAL 2364* 1226* 918* 658*   BNP: BNP (last 3 results)  Recent Labs  06/29/14 1740 08/17/14 0955  BNP 534.8* 1980.9*    IMAGING STUDIES Ct Abdomen Pelvis Wo Contrast  08/26/2014   CLINICAL DATA:  Weakness and dizziness.  EXAM: CT ABDOMEN AND PELVIS WITHOUT CONTRAST  TECHNIQUE: Multidetector CT imaging of the abdomen and pelvis was performed following the standard protocol without IV contrast.  COMPARISON:  09/22/2009  FINDINGS: There are large bilateral pleural effusions, right larger than left. These later dependently an or associated with dependent pulmonary atelectasis. The heart is enlarged. No significant pericardial fluid is seen.  The liver has a normal appearance without contrast.  There has been previous cholecystectomy. The spleen is normal. The pancreas is normal. The adrenal glands show hypertrophic change. The right kidney is normal. The left kidney is atrophic and contains several stones in the lower pole. No sign of active obstruction. There is atherosclerosis of the aorta. The IVC is normal. No retroperitoneal mass or lymphadenopathy. No ascites. No bowel obstruction. Tiny amount of free fluid in the pelvis. There is diverticulosis of the sigmoid colon. Low level diverticulitis could not be excluded but there is no pronounced diverticulitis. There has been previous  hysterectomy. There chronic degenerative changes of the spine.  IMPRESSION: Large pleural effusions, right larger than left. Dependent pulmonary atelectasis.  Previous cholecystectomy and hysterectomy.  Left renal atrophy with nonobstructing renal stones in the lower pole.  Atherosclerosis of the aorta and its branch vessels.  Diverticulosis. Small amount of free fluid in the pelvis. Diverticulitis is not confidently demonstrated, though low level diverticulitis could be in apparent by imaging.   Electronically Signed   By: Nelson Chimes M.D.   On: 08/26/2014 16:17   Dg Chest 2 View  08/26/2014   CLINICAL DATA:  Weakness and dizziness. Currently undergoing chemotherapy for right upper outer quadrant breast carcinoma.  EXAM: CHEST  2 VIEW  COMPARISON:  08/17/2014  FINDINGS: Moderate cardiomegaly remains stable. Right-sided Port-A-Cath remains in appropriate position.  Decrease in diffuse interstitial infiltrates seen since previous study, consistent with decreased interstitial edema or pneumonitis. No evidence of pulmonary consolidation. Probable tiny layering bilateral pleural effusions noted on lateral projection.  IMPRESSION: Resolving diffuse interstitial infiltrates. Stable cardiomegaly and persistent small layering bilateral pleural effusions.   Electronically Signed   By: Earle Gell M.D.   On: 08/26/2014 13:42    US Renal  08/28/2014   CLINICAL DATA:  Acute renal failure.  EXAM: RENAL/URINARY TRACT ULTRASOUND COMPLETE  COMPARISON:  CT abdomen and pelvis August 26, 2014  FINDINGS: Right Kidney:  Length: 10.6 cm. Echogenicity and renal cortical thickness are within normal limits. No mass, perinephric fluid, or hydronephrosis visualized. No sonographically demonstrable calculus or ureterectasis.  Left Kidney:  Length: 7.8 cm. The left kidney is atrophic with areas of scarring. There are several lower pole region calculi, largest measuring 9 mm. There is no demonstrable mass, hydronephrosis, or perinephric fluid. No ureterectasis.  Bladder:  Appears normal for degree of bladder distention.  IMPRESSION: Comparatively atrophic left kidney with lower pole calculi. Appearance is commensurate with recent CT. There is no mass or hydronephrosis on either side. No perinephric fluid collections are appreciable. Right kidney appears normal by ultrasound.   Electronically Signed   By: Lowella Grip III M.D.   On: 08/28/2014 16:19   [place] Art/ven Flow Abd Pelv Doppler  08/29/2014   CLINICAL DATA:  69 year old female with acute renal failure  EXAM: DUPLEX ULTRASOUND OF LIVER  TECHNIQUE: Color and duplex Doppler ultrasound was performed to evaluate the hepatic in-flow and out-flow vessels.  COMPARISON:  Renal ultrasound 08/28/2014; CT abdomen/ pelvis 08/26/2014  FINDINGS: Portal Vein Velocities  Main:  18-27 cm/sec with normal hepatopetal flow.  Right:  Unable to visualize  Left:  Unable to visualize  Hepatic Vein Velocities  Right:  33 cm/sec  Middle:  38 cm/sec  Left:  29 cm/sec  Hepatic Artery Velocity:  69 cm/sec  Splenic Vein Velocity:  20 cm/sec, difficult to visualize.  Varices: None visualized.  Ascites: None.  Bilateral right larger than left pleural effusions. The liver is echogenic suggesting fatty infiltration.  IMPRESSION: 1. Main portal vein is patent with normal hepatopetal flow. Intrahepatic portal veins are difficult  image secondary to a combination of hepatic steatosis and patient body habitus. Given the unremarkable appearance of the main portal vein, significant intrahepatic portal venous thrombosis is considered somewhat unlikely. 2. Hepatic steatosis. 3. Hepatic veins remain patent. 4. Right larger than left pleural effusions.   Electronically Signed   By: Jacqulynn Cadet M.D.   On: 08/29/2014 13:45   Dg Chest Port 1 View  08/28/2014   CLINICAL DATA:  Central line placement.  EXAM: PORTABLE CHEST -  1 VIEW  COMPARISON:  08/26/2014  FINDINGS: New left internal jugular central venous catheter tip in the proximal SVC. There is no pneumothorax. The right chest port remains in place, tip in the SVC. Cardiomegaly is unchanged. Hazy opacity at both lung bases, left greater than right, likely effusions and not significantly changed allowing for differences in technique. Stable prominence of central pulmonary vasculature. No new airspace disease.  IMPRESSION: Tip of the left internal jugular central venous catheter in the SVC. No pneumothorax.   Electronically Signed   By: Jeb Levering M.D.   On: 08/28/2014 18:17   Dg Chest Port 1 View  08/17/2014   CLINICAL DATA:  Nausea, vomiting, weakness and diarrhea. History of breast cancer, on chemotherapy.  EXAM: PORTABLE CHEST - 1 VIEW  COMPARISON:  Chest radiograph June 30, 2014  FINDINGS: The cardiac silhouette is moderately enlarged, increased from prior examination. Pulmonary vascular congestion mild interstitial prominence with small LEFT pleural effusion. Patchy retrocardiac airspace opacity suggested. No pneumothorax. Single lumen RIGHT chest Port-A-Cath with distal tip projecting in proximal superior vena cava. Surgical clips in RIGHT chest.  IMPRESSION: Worsening moderate cardiomegaly with pulmonary vascular congestion interstitial prominence most consistent with pulmonary edema, confluent in the LEFT lung base with small LEFT pleural effusion.   Electronically Signed    By: Elon Alas   On: 08/17/2014 06:10    DISCHARGE EXAMINATION: Filed Vitals:   09/03/14 0423 09/03/14 1520 09/03/14 2029 09/04/14 0626  BP: 117/64 104/59 107/64 134/72  Pulse: 66 71 72 73  Temp: 97.6 F (36.4 C) 97.5 F (36.4 C) 97.5 F (36.4 C) 97.6 F (36.4 C)  TempSrc: Oral Oral Oral Oral  Resp: 20 20 20 20   Height:      Weight: 89.767 kg (197 lb 14.4 oz)   87.408 kg (192 lb 11.2 oz)  SpO2: 94% 100% 100% 95%   General appearance: alert, cooperative, appears stated age and no distress Resp: clear to auscultation bilaterally Cardio: regular rate and rhythm, S1, S2 normal, no murmur, click, rub or gallop GI: soft, non-tender; bowel sounds normal; no masses,  no organomegaly Bluish discoloration of the toes of both lower extremities.  DISPOSITION: skilled nursing facility  Discharge Instructions    Call MD for:  extreme fatigue    Complete by:  As directed      Call MD for:  persistant dizziness or light-headedness    Complete by:  As directed      Call MD for:  persistant nausea and vomiting    Complete by:  As directed      Call MD for:  severe uncontrolled pain    Complete by:  As directed      Call MD for:  temperature >100.4    Complete by:  As directed      Diet - low sodium heart healthy    Complete by:  As directed      Discharge instructions    Complete by:  As directed   CBC and BMET in 1 week. To see Dr. Scot Dock with vascular surgery if the bluish discoloration of her toes become worse.  You were cared for by a hospitalist during your hospital stay. If you have any questions about your discharge medications or the care you received while you were in the hospital after you are discharged, you can call the unit and asked to speak with the hospitalist on call if the hospitalist that took care of you is not available. Once you are discharged,  your primary care physician will handle any further medical issues. Please note that NO REFILLS for any discharge  medications will be authorized once you are discharged, as it is imperative that you return to your primary care physician (or establish a relationship with a primary care physician if you do not have one) for your aftercare needs so that they can reassess your need for medications and monitor your lab values. If you do not have a primary care physician, you can call [phone] for a physician referral.     Increase activity slowly    Complete by:  As directed            ALLERGIES:  Allergies  Allergen Reactions  . Ambien [Zolpidem Tartrate] Other (See Comments)    Per Pt: causes sleepwalking, makes loopy.  . Ciprofloxacin Nausea And Vomiting  . Demerol [Meperidine] Hives  . Lidocaine     palpatations  . Other Swelling    Eye ointments  . Oxycodone Other (See Comments)    Pt states that it makes her feel "loopy and crazy"  . Septra [Sulfamethoxazole-Trimethoprim] Nausea And Vomiting  . Codeine Rash  . Novocain [Procaine] Palpitations  . Vancomycin Rash     Current Discharge Medication List    START taking these medications   Details  gabapentin (NEURONTIN) 100 MG capsule Take 1 capsule (100 mg total) by mouth 2 (two) times daily.    HYDROcodone-acetaminophen (NORCO/VICODIN) 5-325 MG per tablet Take 1 tablet by mouth every 6 (six) hours as needed for moderate pain. Qty: 30 tablet, Refills: 0      CONTINUE these medications which have CHANGED   Details  furosemide (LASIX) 40 MG tablet Take 1 tablet (40 mg total) by mouth daily. Qty: 30 tablet, Refills: 0    LORazepam (ATIVAN) 0.5 MG tablet Take 1 tablet (0.5 mg total) by mouth every 8 (eight) hours as needed for anxiety (nausea). Nausea Qty: 15 tablet, Refills: 0   Associated Diagnoses: Breast cancer of upper-outer quadrant of right female breast    potassium chloride (MICRO-K) 10 MEQ CR capsule Take 1 capsule (10 mEq total) by mouth daily. Qty: 30 capsule, Refills: 0   Associated Diagnoses: Breast cancer of upper-outer  quadrant of right female breast      CONTINUE these medications which have NOT CHANGED   Details  acetaminophen (TYLENOL) 325 MG tablet Take 650 mg by mouth every 6 (six) hours as needed for headache (headache).     diphenhydrAMINE (BENADRYL) 25 MG tablet Take 25 mg by mouth daily as needed for allergies (allergies).     FLUoxetine (PROZAC) 10 MG capsule Take 1 capsule (10 mg total) by mouth daily. Qty: 30 capsule, Refills: 1    lidocaine-prilocaine (EMLA) cream Apply 1 application topically once a week. On Chemo Day (Monday). Refills: 0    metoprolol tartrate (LOPRESSOR) 25 MG tablet Take 1 tablet (25 mg total) by mouth 2 (two) times daily. Qty: 60 tablet, Refills: 3    ondansetron (ZOFRAN ODT) 4 MG disintegrating tablet 4mg  ODT q4 hours prn nausea/vomit Qty: 15 tablet, Refills: 0    Probiotic Product (PROBIOTIC DAILY PO) Take 1 tablet by mouth daily.       STOP taking these medications     cholecalciferol (VITAMIN D) 1000 UNITS tablet      loperamide (IMODIUM) 2 MG capsule      metroNIDAZOLE (FLAGYL) 500 MG tablet      ondansetron (ZOFRAN) 8 MG tablet      PRESCRIPTION MEDICATION  Follow-up Information    Follow up with Gennette Pac, MD. Schedule an appointment as soon as possible for a visit in 2 weeks.   Specialty:  Family Medicine   Why:  post hospitalization follow up   Contact information:   [place] [address] [phone]       Follow up with Rulon Eisenmenger, MD. Schedule an appointment as soon as possible for a visit in 2 weeks.   Specialty:  Hematology and Oncology   Why:  post hospitalization follow up   Contact information:   [place] [address] [phone]       Follow up with DICKSON,CHRISTOPHER S, MD.   Specialty:  Vascular Surgery   Why:  if the bluish discoloration of toes get worse or if pain increases significantly.   Contact information:   [address] [place]  [address] [phone]       TOTAL DISCHARGE TIME: 35 minutes  [facility] Pager [phone]  09/04/2014, 11:36 AM

## 2014-09-04 NOTE — Consult Note (Addendum)
   [facility] CM Inpatient Consult   09/04/2014  Cheyenne Riggs [birth year] [id number]   Came to visit Cheyenne Riggs at bedside to speak with her about [facility] services. Confirmed she is going to SNF at discharge. Reports she does live alone and that her son lives in [place]. States she plans on staying at  [facility] for short term. Discussed that she sometimes has issues with transportation to MD appointments. Agreeable for [facility] Licensed CSW to follow up with her at SNF to ensure she has smooth transition when she returns home. Consents signed. Confirmed her contact number as [phone]. Made inpatient RNCM aware.   Of note, Cheyenne Riggs indicates she has been active with [facility] in the past. States she would prefer to have them again when she eventually returns home.   Marthenia Rolling, MSN-Ed, RN,BSN [facility] Liaison [phone]

## 2014-09-04 NOTE — Clinical Social Work Placement (Signed)
Patient is set to discharge to [facility] today. Patient & son, [name] aware. Discharge packet given to RN, Raquel Sarna. [facility] called for transport to pickup at 1:30pm.    Raynaldo Opitz, [place] Social Worker cell #: [phone]    CLINICAL SOCIAL WORK PLACEMENT  NOTE  Date:  09/04/2014  Patient Details  Name: Cheyenne Riggs MRN: [id number] Date of Birth: [birth year]  Clinical Social Work is seeking post-discharge placement for this patient at the [place] level of care (*CSW will initial, date and re-position this form in  chart as items are completed):  Yes   Patient/family provided with [place] Work Department's list of facilities offering this level of care within the geographic area requested by the patient (or if unable, by the patient's family).  Yes   Patient/family informed of their freedom to choose among providers that offer the needed level of care, that participate in Medicare, Medicaid or managed care program needed by the patient, have an available bed and are willing to accept the patient.  Yes   Patient/family informed of [facility]'s ownership interest in [facility] and [facility], as well as of the fact that they are under no obligation to receive care at these facilities.  PASRR submitted to EDS on 09/03/14     PASRR number received on 09/03/14     Existing PASRR number confirmed on       FL2 transmitted to all facilities in geographic area requested by pt/family on 09/03/14     FL2 transmitted to all facilities within larger geographic area on       Patient informed that his/her managed care company has contracts with or will negotiate with certain facilities, including the following:        Yes   Patient/family informed of bed offers received.  Patient chooses bed at [facility]     Physician recommends and patient chooses bed at      Patient to be  transferred to [facility] on 09/04/14.  Patient to be transferred to facility by [facility]     Patient family notified on 09/04/14 of transfer.  Name of family member notified:  patient's son, [name] via phone     PHYSICIAN       Additional Comment:    _______________________________________________ Standley Brooking, LCSW 09/04/2014, 11:55 AM

## 2014-09-04 NOTE — Progress Notes (Signed)
   09/03/14 1100  Clinical Encounter Type  Visited With Patient  Visit Type Initial  Referral From Chaplain  Spiritual Encounters  Spiritual Needs Prayer;Emotional  Stress Factors  Patient Stress Factors None identified  Family Stress Factors None identified  Advance Directives (For Healthcare)  Does patient have an advance directive? Yes  Would patient like information on creating an advanced directive? No - patient declined information  Type of Paramedic of [place];Living will  Does patient want to make changes to advanced directive? No - Patient declined  Copy of advanced directive(s) in chart? No - copy requested  Chaplain visited with patient and listened as patient talked about things that were on her mind. Chaplain provided support to patient as she spoke of a situation that she needed to repent for. Patient acknowledged that she had been wrong and needed to make the situation right.Chaplain allowed patient to come to her own conclusions.

## 2014-09-04 NOTE — Progress Notes (Signed)
Physical Therapy Treatment Patient Details Name: Cheyenne Riggs MRN: [id number] DOB: [birth year] Today's Date: 09/04/2014    History of Present Illness Pt is 69 yo female with known breast cancer and currently undergoing Abraxane chemotherapy, with chronic nausea since starting chemo, recent diagnosis of C. Diff and completed treatment with flagyl, discharged on April 6th, 2016 after being hospitalized for evaluation of wide complex tachycardia, elevated troponins, treated with beta blocker, acute on chronic combined systolic and diastolic CHF (last 2 D ECHO EF 35%) presented to [facility] ED with main concern of generalized weakness after taking dose of Ativan at home. Currently with acute transaminitis, acute on chronic renal failure SVT with hypotension, metabolic acidoisis and UTI.     PT Comments    +2 for bed mobility and sit to stand. Pt only able to stand briefly due to B toe pain, R more than L. Pt premedicated. Worked on dynamic and static sitting balance, she sat on EOB x 8 minutes.   Follow Up Recommendations  SNF;Supervision/Assistance - 24 hour     Equipment Recommendations  None recommended by PT    Recommendations for Other Services       Precautions / Restrictions Precautions Precautions: Fall Precaution Comments: painful ischemic  feet/toes Restrictions Weight Bearing Restrictions: No    Mobility  Bed Mobility Overal bed mobility: Needs Assistance Bed Mobility: Supine to Sit;Sit to Supine;Rolling Rolling: Max assist   Supine to sit: +2 for safety/equipment;+2 for physical assistance;Max assist Sit to supine: +2 for safety/equipment;+2 for physical assistance;Max assist   General bed mobility comments: assist for legs, patient unable to lift off of bed nor uncross after rolling, assist legs on to bed, assist with trunk  Transfers Overall transfer level: Needs assistance Equipment used: 2 person hand held assist Transfers: Sit to/from Stand Sit to Stand: Mod  assist;From elevated surface;+2 physical assistance;+2 safety/equipment         General transfer comment: lifting assist  to rise, support at both  arms, decreased tolerance to weight bear on Feet. Sit to stand x 2 trials. Pt stood for 5 seconds then 20 seconds.   Ambulation/Gait             General Gait Details: unable due to painful feet.   Stairs            Wheelchair Mobility    Modified Rankin (Stroke Patients Only)       Balance Overall balance assessment: Needs assistance Sitting-balance support: Bilateral upper extremity supported;Feet supported Sitting balance-Leahy Scale: Poor Sitting balance - Comments: Pt sat at EOB x 8 minutes, posterior lean at times, verbal/tactile cues to self correct. Performed reaching and weight shifting activities in sitting with feet supported. Pt able to maintain balance with no UE support briefly but then tends to lean posteriorly, can correct with verbal cues.                             Cognition Arousal/Alertness: Awake/alert Behavior During Therapy: WFL for tasks assessed/performed Overall Cognitive Status: Within Functional Limits for tasks assessed                      Exercises General Exercises - Lower Extremity Ankle Circles/Pumps: AROM;Both;10 reps;Seated Long Arc Quad: AROM;Both;Seated;10 reps    General Comments        Pertinent Vitals/Pain Pain Score: 8  Pain Location: B toes, esp R great toe Pain Descriptors / Indicators: Heaviness Pain Intervention(s):  Premedicated before session;Repositioned;Limited activity within patient's tolerance;Monitored during session    Home Living                      Prior Function            PT Goals (current goals can now be found in the care plan section) Acute Rehab PT Goals Patient Stated Goal: to get stronger so I can go home PT Goal Formulation: With patient Time For Goal Achievement: 09/13/14 Potential to Achieve Goals:  Good Progress towards PT goals: Progressing toward goals    Frequency  Min 3X/week    PT Plan Current plan remains appropriate    Co-evaluation             End of Session   Activity Tolerance: Patient limited by fatigue;Patient limited by pain Patient left: in bed;with call bell/phone within reach;with bed alarm set     Time: 4734-0370 PT Time Calculation (min) (ACUTE ONLY): 25 min  Charges:  $Therapeutic Activity: 23-37 mins                    G Codes:      Philomena Doheny 09/04/2014, 10:50 AM [phone]

## 2014-09-07 ENCOUNTER — Non-Acute Institutional Stay (SKILLED_NURSING_FACILITY): Payer: Medicare Other | Admitting: Internal Medicine

## 2014-09-07 ENCOUNTER — Encounter: Payer: Self-pay | Admitting: Internal Medicine

## 2014-09-07 DIAGNOSIS — L819 Disorder of pigmentation, unspecified: Secondary | ICD-10-CM | POA: Diagnosis not present

## 2014-09-07 DIAGNOSIS — E872 Acidosis, unspecified: Secondary | ICD-10-CM | POA: Insufficient documentation

## 2014-09-07 DIAGNOSIS — C50411 Malignant neoplasm of upper-outer quadrant of right female breast: Secondary | ICD-10-CM | POA: Diagnosis not present

## 2014-09-07 DIAGNOSIS — I5043 Acute on chronic combined systolic (congestive) and diastolic (congestive) heart failure: Secondary | ICD-10-CM

## 2014-09-07 DIAGNOSIS — R7989 Other specified abnormal findings of blood chemistry: Secondary | ICD-10-CM | POA: Diagnosis not present

## 2014-09-07 DIAGNOSIS — N390 Urinary tract infection, site not specified: Secondary | ICD-10-CM | POA: Diagnosis not present

## 2014-09-07 DIAGNOSIS — D696 Thrombocytopenia, unspecified: Secondary | ICD-10-CM

## 2014-09-07 DIAGNOSIS — J9621 Acute and chronic respiratory failure with hypoxia: Secondary | ICD-10-CM

## 2014-09-07 DIAGNOSIS — J9620 Acute and chronic respiratory failure, unspecified whether with hypoxia or hypercapnia: Secondary | ICD-10-CM | POA: Insufficient documentation

## 2014-09-07 DIAGNOSIS — R945 Abnormal results of liver function studies: Secondary | ICD-10-CM

## 2014-09-07 DIAGNOSIS — I471 Supraventricular tachycardia: Secondary | ICD-10-CM

## 2014-09-07 NOTE — Assessment & Plan Note (Signed)
POTENTIALLY CONTINUING PROBLEM; Patient was noted to have mottling of her toes bilaterally. She was also noted to have mottling of her fingertips. Arterial Doppler of the lower extremities did not suggest significant stenosis. She does have congestive heart failure, has thrombocytopenia and atrial fibrillation. She was seen by vascular surgery. They do not suspect a major embolic event. Due to her several comorbidities, patient is not a candidate for invasive testing. Pain control will be mainstay for now. If her pain gets worse and if the discolorations also starts getting worse, she will need to be seen by vascular surgery. For now, just supportive treatment to be given.

## 2014-09-07 NOTE — Assessment & Plan Note (Signed)
She has completed 7 days of ceftriaxone. WBC is now WNL. Urine culture with multiple morphotypes present

## 2014-09-07 NOTE — Assessment & Plan Note (Signed)
with hypoxia (O2 sat 89% on RA)  - last 2 D ECHO with EF 35%, known and persistent right side pleural effusion  - weight on admission 175 lbs --> 198 lbs on 4/19, from IVF pt has required for tx of rhabdo, ARF, acute hepatitis as noted above  - resumed lasix per home medical regimen 4/19  - continueclose monitoring of volume status  - monitor daily weights, I's and O's  - no ACEI in the setting or renal failure  - keep on [place] O2

## 2014-09-07 NOTE — Assessment & Plan Note (Signed)
This was in the setting of acute on chronic renal failure and rhabdomyolysis and progression to ATN. Resolved with Bicarb, off bicarb since 4/17

## 2014-09-07 NOTE — Assessment & Plan Note (Signed)
Unclear what triggered this episode. She was seen by cardiology. Initially she was placed on amiodarone, but this was discontinued due to her liver abnormalities. She was started on beta blocker. She hasn't had any further episodes of SVT.

## 2014-09-07 NOTE — Progress Notes (Signed)
MRN: [id number] Name: Cheyenne Riggs  Sex: female Age: 69 y.o. DOB: July 13, 1945  [place] #: [facility] Facility/Room:511 Level Of Care: SNF Provider: Inocencio Homes D Emergency Contacts: Extended Emergency Contact Information Primary Emergency Contact: [facility] Address: [address] [place], [place] [address] Johnnette Litter of [place] Phone: [phone] Mobile Phone: [phone] Relation: Son Secondary Emergency Contact: Latanya Maudlin, [place] of [place] Phone: [phone] Mobile Phone: [phone] Relation: Friend  Code Status:   Allergies: Ambien; Ciprofloxacin; Demerol; Lidocaine; Other; Oxycodone; Septra; Codeine; Novocain; and Vancomycin  Chief Complaint  Patient presents with  . New Admit To SNF    HPI: Patient is 69 y.o. female who is admitted to SNF for generalized weakness after being hospitalized for acute hepatitis ( 2/2 probably chemo drug for breast CA) and a host of ensuing complications.  Past Medical History  Diagnosis Date  . Anxiety   . Back pain   . H/O bladder problems   . Cholelithiasis   . Depression   . High blood pressure   . Hypercholesteremia   . Kidney stones   . Gum disease   . Complication of anesthesia     bp goes up  . Wears glasses   . Wears dentures     top  . Kidney stones   . Cancer   . Breast cancer     Past Surgical History  Procedure Laterality Date  . Colon biopsy    . Mouth surgery    . Spine surgery    . Colonoscopy    . Tubal ligation    . Lithotripsy    . Rhinoplasty    . Back surgery  1989    lumb lam  . Breast biopsy Right 02/05/2014    invasive ductal cncer  . Abdominal hysterectomy  1982  . Cholecystectomy  2011    lap choli  . Radioactive seed guided mastectomy with axillary sentinel lymph node biopsy Right 02/27/2014    Procedure: RADIOACTIVE SEED GUIDED RIGHT PARTIAL MASTECTOMY WITH AXILLARY SENTINEL LYMPH NODE BIOPSY;  Surgeon: Stark Klein,  MD;  Location: [facility];  Service: General;  Laterality: Right;  . Portacath placement N/A 02/27/2014    Procedure: INSERTION PORT-A-CATH;  Surgeon: Stark Klein, MD;  Location: [facility];  Service: General;  Laterality: N/A;      Medication List       This list is accurate as of: 09/07/14 11:59 PM.  Always use your most recent med list.               acetaminophen 325 MG tablet  Commonly known as:  TYLENOL  Take 650 mg by mouth every 6 (six) hours as needed for headache (headache).     diphenhydrAMINE 25 MG tablet  Commonly known as:  BENADRYL  Take 25 mg by mouth daily as needed for allergies (allergies).     FLUoxetine 10 MG capsule  Commonly known as:  PROZAC  Take 1 capsule (10 mg total) by mouth daily.     furosemide 40 MG tablet  Commonly known as:  LASIX  Take 1 tablet (40 mg total) by mouth daily.     gabapentin 100 MG capsule  Commonly known as:  NEURONTIN  Take 1 capsule (100 mg total) by mouth 2 (two) times daily.     HYDROcodone-acetaminophen 5-325 MG per tablet  Commonly known as:  NORCO/VICODIN  Take 1 tablet by mouth every 6 (six) hours as needed for moderate pain.     lidocaine-prilocaine cream  Commonly known as:  EMLA  Apply 1 application topically once a week. On Chemo Day (Monday).     LORazepam 0.5 MG tablet  Commonly known as:  ATIVAN  Take 1 tablet (0.5 mg total) by mouth every 8 (eight) hours as needed for anxiety (nausea). Nausea     metoprolol tartrate 25 MG tablet  Commonly known as:  LOPRESSOR  Take 1 tablet (25 mg total) by mouth 2 (two) times daily.     ondansetron 4 MG disintegrating tablet  Commonly known as:  ZOFRAN ODT  4mg  ODT q4 hours prn nausea/vomit     potassium chloride 10 MEQ CR capsule  Commonly known as:  MICRO-K  Take 1 capsule (10 mEq total) by mouth daily.     PROBIOTIC DAILY PO  Take 1 tablet by mouth daily.        No orders of the defined types were placed in this  encounter.    Immunization History  Administered Date(s) Administered  . Influenza-Unspecified 02/12/2014    History  Substance Use Topics  . Smoking status: Former Smoker -- 0.00 packs/day    Quit date: 05/16/2014  . Smokeless tobacco: Not on file  . Alcohol Use: No    Family history is noncontributory    Review of Systems  DATA OBTAINED: from patient, nurse GENERAL:  no fevers SKIN: No itching, rash or wounds EYES: No eye pain, redness, discharge EARS: No earache, tinnitus, change in hearing NOSE: No congestion, drainage or bleeding  MOUTH/THROAT: No mouth or tooth pain, No sore throat RESPIRATORY: No cough, wheezing, SOB CARDIAC: No chest pain, palpitations, lower extremity edema  GI: No abdominal pain, No N/V/D or constipation, No heartburn or reflux  GU: No dysuria, frequency or urgency, or incontinence  MUSCULOSKELETAL: No unrelieved bone/joint pain NEUROLOGIC: No headache, dizziness or focal weakness PSYCHIATRIC: No overt anxiety or sadness, No behavior issue.   Filed Vitals:   09/07/14 1932  BP: 138/72  Pulse: 50  Temp: 97.5 F (36.4 C)  Resp: 20    Physical Exam  GENERAL APPEARANCE: Alert, conversant,  Bald headed [facility] acute distress.  SKIN: No diaphoresis rash HEAD: Normocephalic, atraumatic  EYES: Conjunctiva/lids clear. Pupils round, reactive. EOMs intact.  EARS: External exam WNL, canals clear. Hearing grossly normal.  NOSE: No deformity or discharge.  MOUTH/THROAT: Lips w/o lesions  RESPIRATORY: Breathing is even, unlabored. Lung sounds are clear   CARDIOVASCULAR: Heart RRR no murmurs, rubs or gallops. No peripheral edema.   GASTROINTESTINAL: Abdomen is soft, non-tender, not distended w/ normal bowel sounds. GENITOURINARY: Bladder non tender, not distended  MUSCULOSKELETAL: No abnormal joints or musculature NEUROLOGIC:  Cranial nerves 2-12 grossly intact. Moves all extremities  PSYCHIATRIC: Mood and affect appropriate to situation, no  behavioral issues  Patient Active Problem List   Diagnosis Date Noted  . Metabolic acidosis 93/71/6967  . Acute on chronic respiratory failure 09/07/2014  . Acute on chronic combined systolic and diastolic heart failure 89/38/1017  . Discoloration of skin of toe   . Renal failure (ARF), acute on chronic   . Thrombocytopenia   . Palliative care encounter   . Neuropathic pain of both feet   . Elevated liver function tests   . Weakness   . Renal failure 08/26/2014  . Transaminitis 08/26/2014  . Acute renal failure syndrome   . Clostridium difficile colitis 08/24/2014  . Nausea  without vomiting 08/24/2014  . Dehydration 08/24/2014  . Hypokalemia 08/24/2014  . Immobility 08/24/2014  . CKD (chronic kidney disease), stage III 08/18/2014  . Acute systolic heart failure   . Paroxysmal SVT (supraventricular tachycardia) 08/17/2014  . UTI (lower urinary tract infection) 08/17/2014  . Protein-calorie malnutrition, severe 08/17/2014  . Elevated troponin 08/09/2014  . Antineoplastic chemotherapy induced anemia 06/29/2014  . Enteritis due to Clostridium difficile 05/24/2014  . Breast cancer of upper-outer quadrant of right female breast 02/18/2014  . Essential hypertension, benign 02/18/2014    CBC    Component Value Date/Time   WBC 5.8 09/03/2014 0511   WBC 4.9 08/13/2014 1012   RBC 4.11 09/03/2014 0511   RBC 4.34 08/13/2014 1012   HGB 11.8* 09/03/2014 0511   HGB 11.7 08/13/2014 1012   HCT 37.2 09/03/2014 0511   HCT 37.6 08/13/2014 1012   PLT 37* 09/03/2014 0511   PLT 222 08/13/2014 1012   MCV 90.5 09/03/2014 0511   MCV 86.7 08/13/2014 1012   LYMPHSABS 1.0 08/26/2014 1213   LYMPHSABS 0.6* 08/13/2014 1012   MONOABS 0.9 08/26/2014 1213   MONOABS 0.5 08/13/2014 1012   EOSABS 0.0 08/26/2014 1213   EOSABS 0.0 08/13/2014 1012   BASOSABS 0.0 08/26/2014 1213   BASOSABS 0.0 08/13/2014 1012    CMP     Component Value Date/Time   NA 139 09/03/2014 0511   NA 143 08/24/2014 1223    K 3.2* 09/03/2014 0511   K 3.4* 08/24/2014 1223   CL 96 09/03/2014 0511   CO2 35* 09/03/2014 0511   CO2 24 08/24/2014 1223   GLUCOSE 114* 09/03/2014 0511   GLUCOSE 114 08/24/2014 1223   BUN 27* 09/03/2014 0511   BUN 8.9 08/24/2014 1223   CREATININE 1.24* 09/03/2014 0511   CREATININE 1.2* 08/24/2014 1223   CALCIUM 8.6 09/03/2014 0511   CALCIUM 8.9 08/24/2014 1223   PROT 4.8* 09/03/2014 0511   PROT 5.8* 08/13/2014 1012   ALBUMIN 2.4* 09/03/2014 0511   ALBUMIN 3.1* 08/13/2014 1012   AST 77* 09/03/2014 0511   AST 18 08/13/2014 1012   ALT 47* 09/03/2014 0511   ALT 6 08/13/2014 1012   ALKPHOS 183* 09/03/2014 0511   ALKPHOS 70 08/13/2014 1012   BILITOT 1.8* 09/03/2014 0511   BILITOT 0.72 08/13/2014 1012   GFRNONAA 44* 09/03/2014 0511   GFRAA 51* 09/03/2014 0511    Assessment and Plan  Breast cancer of upper-outer quadrant of right female breast Oncology has been following - Patient's oncologist is aware that patient does not want any further chemotherapy. Patient was also seen by palliative medicine. Oncology should follow as outpatient.    Elevated liver function tests transaminitis secondary to drug induced hepatotoxicity, ? Ischemic hepatitis  - CT abd with no specific acute finding to explain transaminitis  - thought to be secondary to drug induced hepatotoxicity and rhabdomyolysis per GI doctor Dr. Watt Climes  - unlikely related to CHF as LFT got worse on Lasix and finally started to trend down with IVF c/w acute hepatotoxicity, drug induced  - Please note that patient initially denied taking Tylenol more than prescribed - upon further questioning 4/15, patient admitted to taking double to triple doses of tylenol for back pain and headaches. Tylenol level was undetectable - please note that Dr. Watt Climes recommended IVF and supportive care avoiding hepatotoxic medications  - Liver tests still elevated but continue improving and trending down. Okay to resume Tylenol as needed  for pain issues.    Renal failure (ARF),  acute on chronic With elevated CK level on admission, this was thought to be secondary to rhabdomyolysis, progression from prerenal etiology to ATN, ? CIN (pt has received contrast for recent CT abd) - Systolic CHF with EF 22% precluded use of aggressive IV fluid hydration but pt had been receiving IVF NS at 75 cc/hr until 4/18 - Renal ultrasound 08/28/2014 with atrophic left kidney, unremarkable right kidney, no signs of hydronephrosis - renal function has improved. Back to baseline. She is making urine.    Paroxysmal SVT (supraventricular tachycardia) Unclear what triggered this episode. She was seen by cardiology. Initially she was placed on amiodarone, but this was discontinued due to her liver abnormalities. She was started on beta blocker. She hasn't had any further episodes of SVT.    Metabolic acidosis This was in the setting of acute on chronic renal failure and rhabdomyolysis and progression to ATN. Resolved with Bicarb, off bicarb since 4/17    Acute on chronic respiratory failure secondary to acute on chronic systolic and diastolic CHF, with hypoxia (O2 sat 89% on RA)  - last 2 D ECHO with EF 35%, known and persistent right side pleural effusion  - weight on admission 175 lbs --> 198 lbs on 4/19, from IVF pt has required for tx of rhabdo, ARF, acute hepatitis as noted above  - resumed lasix per home medical regimen 4/19  - continueclose monitoring of volume status  - monitor daily weights, I's and O's  - no ACEI in the setting or renal failure  - keep on [place] O2    Acute on chronic combined systolic and diastolic heart failure with hypoxia (O2 sat 89% on RA)  - last 2 D ECHO with EF 35%, known and persistent right side pleural effusion  - weight on admission 175 lbs --> 198 lbs on 4/19, from IVF pt has required for tx of rhabdo, ARF, acute hepatitis as noted above  - resumed lasix per home medical regimen 4/19  - continueclose  monitoring of volume status  - monitor daily weights, I's and O's  - no ACEI in the setting or renal failure  - keep on [place] O2    UTI (lower urinary tract infection) She has completed 7 days of ceftriaxone. WBC is now WNL. Urine culture with multiple morphotypes present    Discoloration of skin of toe POTENTIALLY CONTINUING PROBLEM; Patient was noted to have mottling of her toes bilaterally. She was also noted to have mottling of her fingertips. Arterial Doppler of the lower extremities did not suggest significant stenosis. She does have congestive heart failure, has thrombocytopenia and atrial fibrillation. She was seen by vascular surgery. They do not suspect a major embolic event. Due to her several comorbidities, patient is not a candidate for invasive testing. Pain control will be mainstay for now. If her pain gets worse and if the discolorations also starts getting worse, she will need to be seen by vascular surgery. For now, just supportive treatment to be given.    Thrombocytopenia Etiology is unclear but possibly related to sepsis and all her comorbidities. Avoid the heparin products. Monitor counts as outpatient     Hennie Duos, MD

## 2014-09-07 NOTE — Assessment & Plan Note (Signed)
Etiology is unclear but possibly related to sepsis and all her comorbidities. Avoid the heparin products. Monitor counts as outpatient

## 2014-09-07 NOTE — Assessment & Plan Note (Signed)
transaminitis secondary to drug induced hepatotoxicity, ? Ischemic hepatitis  - CT abd with no specific acute finding to explain transaminitis  - thought to be secondary to drug induced hepatotoxicity and rhabdomyolysis per GI doctor Dr. Watt Climes  - unlikely related to CHF as LFT got worse on Lasix and finally started to trend down with IVF c/w acute hepatotoxicity, drug induced  - Please note that patient initially denied taking Tylenol more than prescribed - upon further questioning 4/15, patient admitted to taking double to triple doses of tylenol for back pain and headaches. Tylenol level was undetectable - please note that Dr. Watt Climes recommended IVF and supportive care avoiding hepatotoxic medications  - Liver tests still elevated but continue improving and trending down. Okay to resume Tylenol as needed for pain issues.

## 2014-09-07 NOTE — Assessment & Plan Note (Signed)
Oncology has been following - Patient's oncologist is aware that patient does not want any further chemotherapy. Patient was also seen by palliative medicine. Oncology should follow as outpatient.

## 2014-09-07 NOTE — Assessment & Plan Note (Signed)
secondary to acute on chronic systolic and diastolic CHF, with hypoxia (O2 sat 89% on RA)  - last 2 D ECHO with EF 35%, known and persistent right side pleural effusion  - weight on admission 175 lbs --> 198 lbs on 4/19, from IVF pt has required for tx of rhabdo, ARF, acute hepatitis as noted above  - resumed lasix per home medical regimen 4/19  - continueclose monitoring of volume status  - monitor daily weights, I's and O's  - no ACEI in the setting or renal failure  - keep on [place] O2

## 2014-09-07 NOTE — Assessment & Plan Note (Signed)
With elevated CK level on admission, this was thought to be secondary to rhabdomyolysis, progression from prerenal etiology to ATN, ? CIN (pt has received contrast for recent CT abd) - Systolic CHF with EF 01% precluded use of aggressive IV fluid hydration but pt had been receiving IVF NS at 75 cc/hr until 4/18 - Renal ultrasound 08/28/2014 with atrophic left kidney, unremarkable right kidney, no signs of hydronephrosis - renal function has improved. Back to baseline. She is making urine.

## 2014-09-09 ENCOUNTER — Encounter: Payer: Self-pay | Admitting: Internal Medicine

## 2014-09-09 ENCOUNTER — Ambulatory Visit: Payer: Medicare Other

## 2014-09-09 ENCOUNTER — Non-Acute Institutional Stay (SKILLED_NURSING_FACILITY): Payer: Medicare Other | Admitting: Internal Medicine

## 2014-09-09 DIAGNOSIS — N61 Inflammatory disorders of breast: Secondary | ICD-10-CM | POA: Diagnosis not present

## 2014-09-09 NOTE — Progress Notes (Signed)
MRN: [id number] Name: Cheyenne Riggs  Sex: female Age: 69 y.o. DOB: 1945-11-12  [facility] #: [facility] Facility/Room:511 Level Of Care: SNF Provider: Inocencio Homes D Emergency Contacts: Extended Emergency Contact Information Primary Emergency Contact: [facility] Address: [address] [place], [place] [address] Johnnette Litter of [place] Phone: [phone] Mobile Phone: [phone] Relation: Son Secondary Emergency Contact: Latanya Maudlin, [place] of [place] Phone: [phone] Mobile Phone: [phone] Relation: Friend  Code Status:   Allergies: Ambien; Ciprofloxacin; Demerol; Lidocaine; Other; Oxycodone; Septra; Codeine; Novocain; and Vancomycin  Chief Complaint  Patient presents with  . Acute Visit    HPI: Patient is 69 y.o. female who is being seen for a possible infection at site of breast biopsy.  Past Medical History  Diagnosis Date  . Anxiety   . Back pain   . H/O bladder problems   . Cholelithiasis   . Depression   . High blood pressure   . Hypercholesteremia   . Kidney stones   . Gum disease   . Complication of anesthesia     bp goes up  . Wears glasses   . Wears dentures     top  . Kidney stones   . Cancer   . Breast cancer     Past Surgical History  Procedure Laterality Date  . Colon biopsy    . Mouth surgery    . Spine surgery    . Colonoscopy    . Tubal ligation    . Lithotripsy    . Rhinoplasty    . Back surgery  1989    lumb lam  . Breast biopsy Right 02/05/2014    invasive ductal cncer  . Abdominal hysterectomy  1982  . Cholecystectomy  2011    lap choli  . Radioactive seed guided mastectomy with axillary sentinel lymph node biopsy Right 02/27/2014    Procedure: RADIOACTIVE SEED GUIDED RIGHT PARTIAL MASTECTOMY WITH AXILLARY SENTINEL LYMPH NODE BIOPSY;  Surgeon: Stark Klein, MD;  Location: [facility];  Service: General;  Laterality: Right;  . Portacath placement N/A  02/27/2014    Procedure: INSERTION PORT-A-CATH;  Surgeon: Stark Klein, MD;  Location: [facility];  Service: General;  Laterality: N/A;      Medication List       This list is accurate as of: 09/09/14 11:59 PM.  Always use your most recent med list.               acetaminophen 325 MG tablet  Commonly known as:  TYLENOL  Take 650 mg by mouth every 6 (six) hours as needed for headache (headache).     diphenhydrAMINE 25 MG tablet  Commonly known as:  BENADRYL  Take 25 mg by mouth daily as needed for allergies (allergies).     FLUoxetine 10 MG capsule  Commonly known as:  PROZAC  Take 1 capsule (10 mg total) by mouth daily.     furosemide 40 MG tablet  Commonly known as:  LASIX  Take 1 tablet (40 mg total) by mouth daily.     gabapentin 100 MG capsule  Commonly known as:  NEURONTIN  Take 1 capsule (100 mg total) by mouth 2 (two) times daily.     HYDROcodone-acetaminophen 5-325 MG per tablet  Commonly known as:  NORCO/VICODIN  Take 1 tablet by mouth every 6 (six) hours as needed for moderate pain.  lidocaine-prilocaine cream  Commonly known as:  EMLA  Apply 1 application topically once a week. On Chemo Day (Monday).     LORazepam 0.5 MG tablet  Commonly known as:  ATIVAN  Take 1 tablet (0.5 mg total) by mouth every 8 (eight) hours as needed for anxiety (nausea). Nausea     metoprolol tartrate 25 MG tablet  Commonly known as:  LOPRESSOR  Take 1 tablet (25 mg total) by mouth 2 (two) times daily.     ondansetron 4 MG disintegrating tablet  Commonly known as:  ZOFRAN ODT  4mg  ODT q4 hours prn nausea/vomit     potassium chloride 10 MEQ CR capsule  Commonly known as:  MICRO-K  Take 1 capsule (10 mEq total) by mouth daily.     PROBIOTIC DAILY PO  Take 1 tablet by mouth daily.        No orders of the defined types were placed in this encounter.    Immunization History  Administered Date(s) Administered  . Influenza-Unspecified 02/12/2014     History  Substance Use Topics  . Smoking status: Former Smoker -- 0.00 packs/day    Quit date: 05/16/2014  . Smokeless tobacco: Not on file  . Alcohol Use: No    Review of Systems  DATA OBTAINED: from patient, nurse GENERAL:  no fevers, fatigue, appetite changes SKIN: noticed  Some redness to side of R breast HEENT: No complaint RESPIRATORY: No cough, wheezing, SOB CARDIAC: No chest pain, palpitations, lower extremity edema  GI: No abdominal pain, No N/V/D or constipation, No heartburn or reflux  GU: No dysuria, frequency or urgency, or incontinence  MUSCULOSKELETAL: No unrelieved bone/joint pain NEUROLOGIC: No headache, dizziness  PSYCHIATRIC: No overt anxiety or sadness  Filed Vitals:   09/09/14 1950  BP: 138/72  Pulse: 50  Temp: 97 F (36.1 C)  Resp: 20    Physical Exam  GENERAL APPEARANCE: Alert, conversant, No acute distress  SKIN: bx site R lateral breast with some dependent bruising, swath of mild erythema, no heat HEENT: Unremarkable RESPIRATORY: Breathing is even, unlabored. Lung sounds are clear   CARDIOVASCULAR: Heart RRR no murmurs, rubs or gallops. No peripheral edema  GASTROINTESTINAL: Abdomen is soft, non-tender, not distended w/ normal bowel sounds.  GENITOURINARY: Bladder non tender, not distended  MUSCULOSKELETAL: No abnormal joints or musculature NEUROLOGIC: Cranial nerves 2-12 grossly intact. Moves all extremities PSYCHIATRIC: Mood and affect appropriate to situation, no behavioral issues  Patient Active Problem List   Diagnosis Date Noted  . Metabolic acidosis 53/66/4403  . Acute on chronic respiratory failure 09/07/2014  . Acute on chronic combined systolic and diastolic heart failure 47/42/5956  . Discoloration of skin of toe   . Renal failure (ARF), acute on chronic   . Thrombocytopenia   . Palliative care encounter   . Neuropathic pain of both feet   . Elevated liver function tests   . Weakness   . Renal failure 08/26/2014  .  Transaminitis 08/26/2014  . Acute renal failure syndrome   . Clostridium difficile colitis 08/24/2014  . Nausea without vomiting 08/24/2014  . Dehydration 08/24/2014  . Hypokalemia 08/24/2014  . Immobility 08/24/2014  . CKD (chronic kidney disease), stage III 08/18/2014  . Acute systolic heart failure   . Paroxysmal SVT (supraventricular tachycardia) 08/17/2014  . UTI (lower urinary tract infection) 08/17/2014  . Protein-calorie malnutrition, severe 08/17/2014  . Elevated troponin 08/09/2014  . Antineoplastic chemotherapy induced anemia 06/29/2014  . Enteritis due to Clostridium difficile 05/24/2014  . Breast cancer of  upper-outer quadrant of right female breast 02/18/2014  . Essential hypertension, benign 02/18/2014    CBC    Component Value Date/Time   WBC 5.8 09/03/2014 0511   WBC 4.9 08/13/2014 1012   RBC 4.11 09/03/2014 0511   RBC 4.34 08/13/2014 1012   HGB 11.8* 09/03/2014 0511   HGB 11.7 08/13/2014 1012   HCT 37.2 09/03/2014 0511   HCT 37.6 08/13/2014 1012   PLT 37* 09/03/2014 0511   PLT 222 08/13/2014 1012   MCV 90.5 09/03/2014 0511   MCV 86.7 08/13/2014 1012   LYMPHSABS 1.0 08/26/2014 1213   LYMPHSABS 0.6* 08/13/2014 1012   MONOABS 0.9 08/26/2014 1213   MONOABS 0.5 08/13/2014 1012   EOSABS 0.0 08/26/2014 1213   EOSABS 0.0 08/13/2014 1012   BASOSABS 0.0 08/26/2014 1213   BASOSABS 0.0 08/13/2014 1012    CMP     Component Value Date/Time   NA 139 09/03/2014 0511   NA 143 08/24/2014 1223   K 3.2* 09/03/2014 0511   K 3.4* 08/24/2014 1223   CL 96 09/03/2014 0511   CO2 35* 09/03/2014 0511   CO2 24 08/24/2014 1223   GLUCOSE 114* 09/03/2014 0511   GLUCOSE 114 08/24/2014 1223   BUN 27* 09/03/2014 0511   BUN 8.9 08/24/2014 1223   CREATININE 1.24* 09/03/2014 0511   CREATININE 1.2* 08/24/2014 1223   CALCIUM 8.6 09/03/2014 0511   CALCIUM 8.9 08/24/2014 1223   PROT 4.8* 09/03/2014 0511   PROT 5.8* 08/13/2014 1012   ALBUMIN 2.4* 09/03/2014 0511   ALBUMIN 3.1*  08/13/2014 1012   AST 77* 09/03/2014 0511   AST 18 08/13/2014 1012   ALT 47* 09/03/2014 0511   ALT 6 08/13/2014 1012   ALKPHOS 183* 09/03/2014 0511   ALKPHOS 70 08/13/2014 1012   BILITOT 1.8* 09/03/2014 0511   BILITOT 0.72 08/13/2014 1012   GFRNONAA 44* 09/03/2014 0511   GFRAA 51* 09/03/2014 0511    Assessment and Plan  Possible cellulitis of breast - pt does have some erythema at bx site but as much as the pt wanted me to see the situation she did not want to take an antibiotic. She has had c diff twice! Will monitor.  Hennie Duos, MD

## 2014-09-10 ENCOUNTER — Encounter: Payer: Self-pay | Admitting: *Deleted

## 2014-09-11 ENCOUNTER — Non-Acute Institutional Stay (SKILLED_NURSING_FACILITY): Payer: Medicare Other | Admitting: Internal Medicine

## 2014-09-11 ENCOUNTER — Encounter: Payer: Self-pay | Admitting: Internal Medicine

## 2014-09-11 DIAGNOSIS — N189 Chronic kidney disease, unspecified: Secondary | ICD-10-CM | POA: Diagnosis not present

## 2014-09-11 DIAGNOSIS — L819 Disorder of pigmentation, unspecified: Secondary | ICD-10-CM | POA: Diagnosis not present

## 2014-09-11 DIAGNOSIS — N179 Acute kidney failure, unspecified: Secondary | ICD-10-CM

## 2014-09-11 DIAGNOSIS — R945 Abnormal results of liver function studies: Secondary | ICD-10-CM

## 2014-09-11 DIAGNOSIS — R7989 Other specified abnormal findings of blood chemistry: Secondary | ICD-10-CM

## 2014-09-11 NOTE — Assessment & Plan Note (Signed)
09/10/2014 BUN 11/Cr 0.94 down from 4/21 27/1.24 and GFR > 60 up from 44; improved, would consider probably baseline

## 2014-09-11 NOTE — Assessment & Plan Note (Addendum)
(  This patient has some mottling of the left thumb and index finger distally, the right fourth and fifth toes, and the left great toe and fifth toe. This could potentially be related to her thrombocytopenia. In addition she has a history of tachybradycardia syndrome and paroxysmal atrial fibrillation. 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 Thus she has multiple potential sources for microembolization to her toes. However, currently I would not recommend an aggressive workup. She has some renal insufficiency and would not be a good candidate for arteriography as visualization with CO2 would be somewhat limited. In addition she is significantly debilitated with multiple medical issues)  Pt is having increasing events to L great and 5th toe and R great toe with pain. This has become her biggest problem now and causing significant  Pain. She is ready to see vascular.  Pt's renal function is improved to what I would consider normal, PLT's increased to 86 and LFT's almost back to normal. I was unable to get in touch with vascular surgeon on call.  After discussion with pt starting COUMADIN at  3mg  daily. On Monday,, day 3 will check PT/INR, repeat CMP and CBC.

## 2014-09-11 NOTE — Assessment & Plan Note (Signed)
09/10/2014 -AST 77 to 55               LFT's look like will be coming back to normal.                   ALT 47 to 29 nl                   AlkP 183 to 142                   T Bili 1.8 to 1.22 nl

## 2014-09-13 ENCOUNTER — Encounter: Payer: Self-pay | Admitting: Internal Medicine

## 2014-09-13 DIAGNOSIS — Z9181 History of falling: Secondary | ICD-10-CM | POA: Diagnosis not present

## 2014-09-13 DIAGNOSIS — J9621 Acute and chronic respiratory failure with hypoxia: Secondary | ICD-10-CM | POA: Diagnosis not present

## 2014-09-13 DIAGNOSIS — R488 Other symbolic dysfunctions: Secondary | ICD-10-CM | POA: Diagnosis not present

## 2014-09-13 DIAGNOSIS — I10 Essential (primary) hypertension: Secondary | ICD-10-CM | POA: Diagnosis not present

## 2014-09-13 DIAGNOSIS — Z7901 Long term (current) use of anticoagulants: Secondary | ICD-10-CM | POA: Diagnosis not present

## 2014-09-13 DIAGNOSIS — L97529 Non-pressure chronic ulcer of other part of left foot with unspecified severity: Secondary | ICD-10-CM | POA: Diagnosis not present

## 2014-09-13 DIAGNOSIS — I70244 Atherosclerosis of native arteries of left leg with ulceration of heel and midfoot: Secondary | ICD-10-CM | POA: Diagnosis not present

## 2014-09-13 DIAGNOSIS — E780 Pure hypercholesterolemia: Secondary | ICD-10-CM | POA: Diagnosis not present

## 2014-09-13 DIAGNOSIS — Z9049 Acquired absence of other specified parts of digestive tract: Secondary | ICD-10-CM | POA: Diagnosis not present

## 2014-09-13 DIAGNOSIS — E873 Alkalosis: Secondary | ICD-10-CM | POA: Diagnosis not present

## 2014-09-13 DIAGNOSIS — I471 Supraventricular tachycardia: Secondary | ICD-10-CM | POA: Diagnosis not present

## 2014-09-13 DIAGNOSIS — Z881 Allergy status to other antibiotic agents status: Secondary | ICD-10-CM | POA: Diagnosis not present

## 2014-09-13 DIAGNOSIS — I701 Atherosclerosis of renal artery: Secondary | ICD-10-CM | POA: Diagnosis present

## 2014-09-13 DIAGNOSIS — N179 Acute kidney failure, unspecified: Secondary | ICD-10-CM | POA: Diagnosis not present

## 2014-09-13 DIAGNOSIS — I480 Paroxysmal atrial fibrillation: Secondary | ICD-10-CM | POA: Diagnosis not present

## 2014-09-13 DIAGNOSIS — I4891 Unspecified atrial fibrillation: Secondary | ICD-10-CM | POA: Diagnosis not present

## 2014-09-13 DIAGNOSIS — R2681 Unsteadiness on feet: Secondary | ICD-10-CM | POA: Diagnosis not present

## 2014-09-13 DIAGNOSIS — C50919 Malignant neoplasm of unspecified site of unspecified female breast: Secondary | ICD-10-CM | POA: Diagnosis not present

## 2014-09-13 DIAGNOSIS — F329 Major depressive disorder, single episode, unspecified: Secondary | ICD-10-CM | POA: Diagnosis not present

## 2014-09-13 DIAGNOSIS — I75023 Atheroembolism of bilateral lower extremities: Secondary | ICD-10-CM | POA: Diagnosis not present

## 2014-09-13 DIAGNOSIS — B029 Zoster without complications: Secondary | ICD-10-CM | POA: Diagnosis not present

## 2014-09-13 DIAGNOSIS — Z886 Allergy status to analgesic agent status: Secondary | ICD-10-CM | POA: Diagnosis not present

## 2014-09-13 DIAGNOSIS — I70235 Atherosclerosis of native arteries of right leg with ulceration of other part of foot: Secondary | ICD-10-CM | POA: Diagnosis not present

## 2014-09-13 DIAGNOSIS — Z87891 Personal history of nicotine dependence: Secondary | ICD-10-CM | POA: Diagnosis not present

## 2014-09-13 DIAGNOSIS — I129 Hypertensive chronic kidney disease with stage 1 through stage 4 chronic kidney disease, or unspecified chronic kidney disease: Secondary | ICD-10-CM | POA: Diagnosis not present

## 2014-09-13 DIAGNOSIS — I70245 Atherosclerosis of native arteries of left leg with ulceration of other part of foot: Secondary | ICD-10-CM | POA: Diagnosis not present

## 2014-09-13 DIAGNOSIS — I5022 Chronic systolic (congestive) heart failure: Secondary | ICD-10-CM | POA: Diagnosis not present

## 2014-09-13 DIAGNOSIS — Z882 Allergy status to sulfonamides status: Secondary | ICD-10-CM | POA: Diagnosis not present

## 2014-09-13 DIAGNOSIS — M6281 Muscle weakness (generalized): Secondary | ICD-10-CM | POA: Diagnosis not present

## 2014-09-13 DIAGNOSIS — Z888 Allergy status to other drugs, medicaments and biological substances status: Secondary | ICD-10-CM | POA: Diagnosis not present

## 2014-09-13 DIAGNOSIS — Z87442 Personal history of urinary calculi: Secondary | ICD-10-CM | POA: Diagnosis not present

## 2014-09-13 DIAGNOSIS — L03031 Cellulitis of right toe: Secondary | ICD-10-CM | POA: Diagnosis not present

## 2014-09-13 DIAGNOSIS — N183 Chronic kidney disease, stage 3 (moderate): Secondary | ICD-10-CM | POA: Diagnosis not present

## 2014-09-13 DIAGNOSIS — I5043 Acute on chronic combined systolic (congestive) and diastolic (congestive) heart failure: Secondary | ICD-10-CM | POA: Diagnosis not present

## 2014-09-13 DIAGNOSIS — L97519 Non-pressure chronic ulcer of other part of right foot with unspecified severity: Secondary | ICD-10-CM | POA: Diagnosis not present

## 2014-09-13 DIAGNOSIS — F419 Anxiety disorder, unspecified: Secondary | ICD-10-CM | POA: Diagnosis not present

## 2014-09-13 NOTE — Progress Notes (Signed)
MRN: [id number] Name: Cheyenne Riggs  Sex: female Age: 69 y.o. DOB: 08-22-1945  [facility] Facility/Room: 509 Level Of Care: SNF Provider: Inocencio Homes D Emergency Contacts: Extended Emergency Contact Information Primary Emergency Contact: Cheyenne Riggs Address: [address] [place], [place] [address] Cheyenne Riggs of [place] Phone: [phone] Mobile Phone: [phone] Relation: Son Secondary Emergency Contact: Cheyenne Riggs, [place] of [place] Phone: [phone] Mobile Phone: [phone] Relation: Friend  Code Status: FULL  Allergies: Ambien; Ciprofloxacin; Demerol; Lidocaine; Other; Oxycodone; Septra; Codeine; Novocain; and Vancomycin  Chief Complaint  Patient presents with  . Acute Visit    HPI: Patient is 70 y.o. female who is being seen for increased discoloration and pain in toes.   Past Medical History  Diagnosis Date  . Anxiety   . Back pain   . H/O bladder problems   . Cholelithiasis   . Depression   . High blood pressure   . Hypercholesteremia   . Kidney stones   . Gum disease   . Complication of anesthesia     bp goes up  . Wears glasses   . Wears dentures     top  . Kidney stones   . Cancer   . Breast cancer     Past Surgical History  Procedure Laterality Date  . Colon biopsy    . Mouth surgery    . Spine surgery    . Colonoscopy    . Tubal ligation    . Lithotripsy    . Rhinoplasty    . Back surgery  1989    lumb lam  . Breast biopsy Right 02/05/2014    invasive ductal cncer  . Abdominal hysterectomy  1982  . Cholecystectomy  2011    lap choli  . Radioactive seed guided mastectomy with axillary sentinel lymph node biopsy Right 02/27/2014    Procedure: RADIOACTIVE SEED GUIDED RIGHT PARTIAL MASTECTOMY WITH AXILLARY SENTINEL LYMPH NODE BIOPSY;  Surgeon: Cheyenne Klein, MD;  Location: [facility];  Service: General;  Laterality: Right;  . Portacath placement N/A  02/27/2014    Procedure: INSERTION PORT-A-CATH;  Surgeon: Cheyenne Klein, MD;  Location: [facility];  Service: General;  Laterality: N/A;      Medication List       This list is accurate as of: 09/11/14 11:59 PM.  Always use your most recent med list.               acetaminophen 325 MG tablet  Commonly known as:  TYLENOL  Take 650 mg by mouth every 6 (six) hours as needed for headache (headache).     diphenhydrAMINE 25 MG tablet  Commonly known as:  BENADRYL  Take 25 mg by mouth daily as needed for allergies (allergies).     FLUoxetine 10 MG capsule  Commonly known as:  PROZAC  Take 1 capsule (10 mg total) by mouth daily.     furosemide 40 MG tablet  Commonly known as:  LASIX  Take 1 tablet (40 mg total) by mouth daily.     gabapentin 100 MG capsule  Commonly known as:  NEURONTIN  Take 1 capsule (100 mg total) by mouth 2 (two) times daily.     HYDROcodone-acetaminophen 5-325 MG per tablet  Commonly known as:  NORCO/VICODIN  Take 1 tablet by mouth every 6 (six) hours as needed for moderate pain.  lidocaine-prilocaine cream  Commonly known as:  EMLA  Apply 1 application topically once a week. On Chemo Day (Monday).     LORazepam 0.5 MG tablet  Commonly known as:  ATIVAN  Take 1 tablet (0.5 mg total) by mouth every 8 (eight) hours as needed for anxiety (nausea). Nausea     metoprolol tartrate 25 MG tablet  Commonly known as:  LOPRESSOR  Take 1 tablet (25 mg total) by mouth 2 (two) times daily.     ondansetron 4 MG disintegrating tablet  Commonly known as:  ZOFRAN ODT  4mg  ODT q4 hours prn nausea/vomit     potassium chloride 10 MEQ CR capsule  Commonly known as:  MICRO-K  Take 1 capsule (10 mEq total) by mouth daily.     PROBIOTIC DAILY PO  Take 1 tablet by mouth daily.        No orders of the defined types were placed in this encounter.    Immunization History  Administered Date(s) Administered  . Influenza-Unspecified 02/12/2014     History  Substance Use Topics  . Smoking status: Former Smoker -- 0.00 packs/day    Quit date: 05/16/2014  . Smokeless tobacco: Not on file  . Alcohol Use: No    Review of Systems  DATA OBTAINED: from patient, nurse, medical record, family member GENERAL:  no fevers, fatigue, appetite changes SKIN: discoloration, swelling and pain in toes. HEENT: No complaint RESPIRATORY: No cough, wheezing, SOB CARDIAC: No chest pain, palpitations, lower extremity edema  GI: No abdominal pain, No N/V/D or constipation, No heartburn or reflux  GU: No dysuria, frequency or urgency, or incontinence  MUSCULOSKELETAL: No unrelieved bone/joint pain NEUROLOGIC: No headache, dizziness  PSYCHIATRIC: No overt anxiety or sadness  Filed Vitals:   09/11/14 1523  BP: 110/76  Pulse: 75  Temp: 97 F (36.1 C)  Resp: 18    Physical Exam  GENERAL APPEARANCE: Alert, conversant, No acute distress  SKIN: L foot -Distal half of great toe purple,with mild swelling and erythema, no heat; distal 2/3 of 5th toe purple; R foot - wedge shaped discoloration of great toe as well as mottling HEENT: Unremarkable RESPIRATORY: Breathing is even, unlabored. Lung sounds are clear   CARDIOVASCULAR: Heart RRR no murmurs, rubs or gallops. No peripheral edema  GASTROINTESTINAL: Abdomen is soft, non-tender, not distended w/ normal bowel sounds.  GENITOURINARY: Bladder non tender, not distended  MUSCULOSKELETAL: No abnormal joints or musculature NEUROLOGIC: Cranial nerves 2-12 grossly intact. Moves all extremities PSYCHIATRIC: Mood and affect appropriate to situation, no behavioral issues  Patient Active Problem List   Diagnosis Date Noted  . Metabolic acidosis 09/73/5329  . Acute on chronic respiratory failure 09/07/2014  . Acute on chronic combined systolic and diastolic heart failure 92/42/6834  . Discoloration of skin of toe   . Renal failure (ARF), acute on chronic   . Thrombocytopenia   . Palliative care  encounter   . Neuropathic pain of both feet   . Elevated liver function tests   . Weakness   . Renal failure 08/26/2014  . Transaminitis 08/26/2014  . Acute renal failure syndrome   . Clostridium difficile colitis 08/24/2014  . Nausea without vomiting 08/24/2014  . Dehydration 08/24/2014  . Hypokalemia 08/24/2014  . Immobility 08/24/2014  . CKD (chronic kidney disease), stage III 08/18/2014  . Acute systolic heart failure   . Paroxysmal SVT (supraventricular tachycardia) 08/17/2014  . UTI (lower urinary tract infection) 08/17/2014  . Protein-calorie malnutrition, severe 08/17/2014  . Elevated troponin 08/09/2014  .  Antineoplastic chemotherapy induced anemia 06/29/2014  . Enteritis due to Clostridium difficile 05/24/2014  . Breast cancer of upper-outer quadrant of right female breast 02/18/2014  . Essential hypertension, benign 02/18/2014    CBC    Component Value Date/Time   WBC 5.8 09/03/2014 0511   WBC 4.9 08/13/2014 1012   RBC 4.11 09/03/2014 0511   RBC 4.34 08/13/2014 1012   HGB 11.8* 09/03/2014 0511   HGB 11.7 08/13/2014 1012   HCT 37.2 09/03/2014 0511   HCT 37.6 08/13/2014 1012   PLT 37* 09/03/2014 0511   PLT 222 08/13/2014 1012   MCV 90.5 09/03/2014 0511   MCV 86.7 08/13/2014 1012   LYMPHSABS 1.0 08/26/2014 1213   LYMPHSABS 0.6* 08/13/2014 1012   MONOABS 0.9 08/26/2014 1213   MONOABS 0.5 08/13/2014 1012   EOSABS 0.0 08/26/2014 1213   EOSABS 0.0 08/13/2014 1012   BASOSABS 0.0 08/26/2014 1213   BASOSABS 0.0 08/13/2014 1012    CMP     Component Value Date/Time   NA 139 09/03/2014 0511   NA 143 08/24/2014 1223   K 3.2* 09/03/2014 0511   K 3.4* 08/24/2014 1223   CL 96 09/03/2014 0511   CO2 35* 09/03/2014 0511   CO2 24 08/24/2014 1223   GLUCOSE 114* 09/03/2014 0511   GLUCOSE 114 08/24/2014 1223   BUN 27* 09/03/2014 0511   BUN 8.9 08/24/2014 1223   CREATININE 1.24* 09/03/2014 0511   CREATININE 1.2* 08/24/2014 1223   CALCIUM 8.6 09/03/2014 0511    CALCIUM 8.9 08/24/2014 1223   PROT 4.8* 09/03/2014 0511   PROT 5.8* 08/13/2014 1012   ALBUMIN 2.4* 09/03/2014 0511   ALBUMIN 3.1* 08/13/2014 1012   AST 77* 09/03/2014 0511   AST 18 08/13/2014 1012   ALT 47* 09/03/2014 0511   ALT 6 08/13/2014 1012   ALKPHOS 183* 09/03/2014 0511   ALKPHOS 70 08/13/2014 1012   BILITOT 1.8* 09/03/2014 0511   BILITOT 0.72 08/13/2014 1012   GFRNONAA 44* 09/03/2014 0511   GFRAA 51* 09/03/2014 0511    Assessment and Plan  Discoloration of skin of toe (This patient has some mottling of the left thumb and index finger distally, the right fourth and fifth toes, and the left great toe and fifth toe. This could potentially be related to her thrombocytopenia. In addition she has a history of tachybradycardia syndrome and paroxysmal atrial fibrillation. 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 Thus she has multiple potential sources for microembolization to her toes. However, currently I would not recommend an aggressive workup. She has some renal insufficiency and would not be a good candidate for arteriography as visualization with CO2 would be somewhat limited. In addition she is significantly debilitated with multiple medical issues)  Pt is having increasing events to L great and 5th toe and R great toe with pain. This has become her biggest problem now and causing significant  Pain. She is ready to see vascular.  Pt's renal function is improved to what I would consider normal, PLT's increased to 86 and LFT's almost back to normal. I was unable to get in touch with vascular surgeon on call.  After discussion with pt starting COUMADIN at  3mg  daily. On Monday,, day 3 will check PT/INR, repeat CMP and CBC.   Renal failure (ARF), acute on chronic 09/10/2014 BUN 11/Cr 0.94 down from 4/21 27/1.24 and GFR > 60 up from 44; improved, would consider probably  baseline   Elevated  liver function tests 09/10/2014 -AST 77 to 55               LFT's look like will be coming back to normal.                   ALT 47 to 29 nl                   AlkP 183 to 142                   T Bili 1.8 to 1.22 nl    Pt seen 09/11/2014. Hennie Duos, MD

## 2014-09-15 ENCOUNTER — Encounter: Payer: Self-pay | Admitting: Vascular Surgery

## 2014-09-15 ENCOUNTER — Non-Acute Institutional Stay (SKILLED_NURSING_FACILITY): Payer: Medicare Other | Admitting: Internal Medicine

## 2014-09-15 DIAGNOSIS — E873 Alkalosis: Secondary | ICD-10-CM | POA: Diagnosis not present

## 2014-09-15 DIAGNOSIS — L97519 Non-pressure chronic ulcer of other part of right foot with unspecified severity: Secondary | ICD-10-CM

## 2014-09-15 DIAGNOSIS — L97529 Non-pressure chronic ulcer of other part of left foot with unspecified severity: Secondary | ICD-10-CM | POA: Diagnosis not present

## 2014-09-15 NOTE — Progress Notes (Signed)
Patient ID: Cheyenne Riggs, female   DOB: 02/22/1946, 69 y.o.   MRN: [id number]    [facility] Chief complaint; asked to review lesions on multiple toes by the facility. History; this is a patient who underwent a complex hospitalization to [facility] dominated by acute renal failure, unexplained transaminitis recurrent supraventricular tachycardia bradycardia and at some point during this hospitalization developed the ischemic-looking areas on her toes and I gather also her fingers. She was seen by vascular surgery in the hospital. A arterial Doppler did not show evidence of significant macrovascular disease. Her echocardiogram showed a significant reduction in her systolic function but did not show a thrombotic source although I'm not sure that's what they were looking for. The patient is not a diabetic. She was on pressors I believe at during the hospitalization but I am not exactly sure which ones. A CT scan of the chest that and abdomen showed atherosclerotic disease in her abdominal aorta.  I was also shown lab work today showing a white count of 4.4 hemoglobin of 11.5 a platelet count of 80,000 sodium of 141 potassium of 3.2 her total CO2 is 43.5 her BUN is 11 creatinine 0.86 AST is normal at 37 ALT at 21 alkaline phosphatase at 138. Looking back on her lab work her total CO2 seems to be going steadily up word even during the course of her hospitalization. It was 41.1 here. Her potassium is 3.2 and she is on Lasix 40 mg a day.  Past Medical History  Diagnosis Date  . Anxiety   . Back pain   . H/O bladder problems   . Cholelithiasis   . Depression   . High blood pressure   . Hypercholesteremia   . Kidney stones   . Gum disease   . Complication of anesthesia     bp goes up  . Wears glasses   . Wears dentures     top  . Kidney stones   . Cancer   . Breast cancer     Past Surgical History  Procedure Laterality Date  . Colon biopsy    . Mouth surgery    . Spine surgery      . Colonoscopy    . Tubal ligation    . Lithotripsy    . Rhinoplasty    . Back surgery  1989    lumb lam  . Breast biopsy Right 02/05/2014    invasive ductal cncer  . Abdominal hysterectomy  1982  . Cholecystectomy  2011    lap choli  . Radioactive seed guided mastectomy with axillary sentinel lymph node biopsy Right 02/27/2014    Procedure: RADIOACTIVE SEED GUIDED RIGHT PARTIAL MASTECTOMY WITH AXILLARY SENTINEL LYMPH NODE BIOPSY;  Surgeon: Stark Klein, MD;  Location: [facility];  Service: General;  Laterality: Right;  . Portacath placement N/A 02/27/2014    Procedure: INSERTION PORT-A-CATH;  Surgeon: Stark Klein, MD;  Location: [place];  Service: General;  Laterality: N/A;   Review of systems Respiratory; she does not complain of cough or shortness of breath she does not carry a diagnosis of chronic obstructive lung disease Cardiac no complaints of chest pain Extremities she does not complain of claudication or really any pain in her toes  Physical examination Respiratory shallow but otherwise clear entry bilaterally Cardiac heart sounds are normal her JVP is not elevated there is no S3. I see no evidence of congestive heart failure Extremities; peripheral pulses are not palpable in her  feet yet her feet are warm. She has ischemic-looking areas on the left first toe fifth toe the base of the left first met head the right first third and fourth toes are affected to a lesser degree. I did not note anything in her hands.  Impression/plan #1 ischemic areas on several toes especially the left first and fifth. There are lesser changes in several toes on the right. The cause of this is not completely clear however I would wonder about a cholesterol emboli syndrome as she does have evidence of atheromata in her aorta. I would also wonder if this had something to do with her hypotension and/or some of her pressors that she received. Of note an atheromatous emboli  syndrome might have accounted for her acute renal failure and perhaps even her transaminitis if the source of this was cardiac. I do not think any of these areas need debridement nor do I think any of them require surgery at this point. #2 what would appear to be a metabolic alkalosis. She does not look to be volume contracted however nor does she really seem to need her Lasix at the moment I've put this on hold I'm going to aggressively replace her potassium as hypokalemia can be a cause of this. Beyond the possibility of a contraction alkalosis/loop diuretic induced hydrogen anion loss, I don't see why this is happening. I do note that her total CO2 progressively increased during her entire hospitalization. This is going to need to be carefully followed and I have ordered a basic metabolic panel to be followed on Thursday. Along with her potassium. She may need to have some of her Lasix restarted. #3 she is totally on anticoagulated on Coumadin 3 mg. I had increase this to 5 mg today and tomorrow with a follow-up on Thursday. Temporarily considered giving her Lovenox

## 2014-09-16 ENCOUNTER — Ambulatory Visit (INDEPENDENT_AMBULATORY_CARE_PROVIDER_SITE_OTHER): Payer: Medicare Other | Admitting: Vascular Surgery

## 2014-09-16 ENCOUNTER — Encounter: Payer: Self-pay | Admitting: Vascular Surgery

## 2014-09-16 ENCOUNTER — Encounter: Payer: Self-pay | Admitting: Internal Medicine

## 2014-09-16 ENCOUNTER — Non-Acute Institutional Stay (SKILLED_NURSING_FACILITY): Payer: Medicare Other | Admitting: Internal Medicine

## 2014-09-16 VITALS — BP 104/52 | HR 72 | Ht 63.0 in | Wt 168.0 lb

## 2014-09-16 DIAGNOSIS — L97519 Non-pressure chronic ulcer of other part of right foot with unspecified severity: Secondary | ICD-10-CM

## 2014-09-16 DIAGNOSIS — L97529 Non-pressure chronic ulcer of other part of left foot with unspecified severity: Secondary | ICD-10-CM

## 2014-09-16 NOTE — Progress Notes (Signed)
[facility] [place]  Patient name: Cheyenne Riggs MRN: [id number] DOB: [birth year] Sex: female  REASON FOR VISIT: ischemic toes both feet  HPI: Cheyenne Riggs is a 69 y.o. female who I saw as a consult in the hospital on 09/03/2014. The patient has a history of breast cancer. She had been undergoing chemotherapy and was admitted with fatigue. She apparently had decided to stop taking chemotherapy she could not tolerate this any more. During the admission she was noted to have ischemic toes and vascular surgery was consult. Of note, she was also diagnosed with atrial fibrillation during that admission and was started on heparin and subsequently has been started on Coumadin.  She denies any significant claudication prior to her recent hospitalization although I think her activity was fairly limited. She does describe some cramps in her calves but I do not get any clear-cut history of rest pain or history of nonhealing ulcers previously.  She had arterial Doppler studies during this admission which showed ABIs of 100% bilaterally. However I felt these may be falsely elevated because of her calcific disease. On the right side she had a monophasic anterior tibial signal and a biphasic posterior tibial signal. On the left side she had a monophasic dorsalis pedis signal.  She also had a CT scan of the abdomen and pelvis that admission which showed aortoiliac occlusive disease. I felt that this could potentially be a source of embolization.  She had some mottling of her left thumb and index finger, her right fourth and fifth fingers, and her left great toe and fifth toe. I felt that this could potentially be related to her thrombocytopenia however this has improved. Her most recent platelet count was 80,000. I discussed her case over the phone today with Dr. Olam Idler.  Of note she is on 4 mg of Coumadin daily.  Past Medical History  Diagnosis Date  . Anxiety   . Back pain   .  H/O bladder problems   . Cholelithiasis   . Depression   . High blood pressure   . Hypercholesteremia   . Kidney stones   . Gum disease   . Complication of anesthesia     bp goes up  . Wears glasses   . Wears dentures     top  . Kidney stones   . Cancer   . Breast cancer    Family History  Problem Relation Age of Onset  . Stroke Mother     onset in her 47's  . Cancer - Other Father     eye cancer, black lung diseae  . Heart failure Sister   . Hypertension Brother    SOCIAL HISTORY: History  Substance Use Topics  . Smoking status: Former Smoker -- 0.00 packs/day    Quit date: 05/16/2014  . Smokeless tobacco: Not on file  . Alcohol Use: No   Allergies  Allergen Reactions  . Ambien [Zolpidem Tartrate] Other (See Comments)    Per Pt: causes sleepwalking, makes loopy.  . Ciprofloxacin Nausea And Vomiting  . Demerol [Meperidine] Hives  . Lidocaine     palpatations  . Other Swelling    Eye ointments  . Oxycodone Other (See Comments)    Pt states that it makes her feel "loopy and crazy"  . Septra [Sulfamethoxazole-Trimethoprim] Nausea And Vomiting  . Codeine Rash  . Novocain [Procaine] Palpitations  . Vancomycin Rash   Current Outpatient Prescriptions  Medication Sig Dispense Refill  . acetaminophen (TYLENOL) 325  MG tablet Take 650 mg by mouth every 6 (six) hours as needed for headache (headache).     . diphenhydrAMINE (BENADRYL) 25 MG tablet Take 25 mg by mouth daily as needed for allergies (allergies).     Marland Kitchen FLUoxetine (PROZAC) 10 MG capsule Take 1 capsule (10 mg total) by mouth daily. 30 capsule 1  . furosemide (LASIX) 40 MG tablet Take 1 tablet (40 mg total) by mouth daily. 30 tablet 0  . gabapentin (NEURONTIN) 100 MG capsule Take 1 capsule (100 mg total) by mouth 2 (two) times daily.    Marland Kitchen HYDROcodone-acetaminophen (NORCO/VICODIN) 5-325 MG per tablet Take 1 tablet by mouth every 6 (six) hours as needed for moderate pain. 30 tablet 0  . lidocaine-prilocaine (EMLA)  cream Apply 1 application topically once a week. On Chemo Day (Monday).  0  . LORazepam (ATIVAN) 0.5 MG tablet Take 1 tablet (0.5 mg total) by mouth every 8 (eight) hours as needed for anxiety (nausea). Nausea 15 tablet 0  . metoprolol tartrate (LOPRESSOR) 25 MG tablet Take 1 tablet (25 mg total) by mouth 2 (two) times daily. 60 tablet 3  . ondansetron (ZOFRAN ODT) 4 MG disintegrating tablet 4mg  ODT q4 hours prn nausea/vomit (Patient taking differently: Take 4 mg by mouth every 4 (four) hours as needed for nausea or vomiting. ) 15 tablet 0  . potassium chloride (MICRO-K) 10 MEQ CR capsule Take 1 capsule (10 mEq total) by mouth daily. 30 capsule 0  . Probiotic Product (PROBIOTIC DAILY PO) Take 1 tablet by mouth daily.      No current facility-administered medications for this visit.   REVIEW OF SYSTEMS: Valu.Nieves ] denotes positive finding; [  ] denotes negative finding  CARDIOVASCULAR:  [ ]  chest pain   [ ]  chest pressure   [ ]  palpitations   [ ]  orthopnea   Valu.Nieves ] dyspnea on exertion   [ ]  claudication   [ ]  rest pain   [ ]  DVT   [ ]  phlebitis PULMONARY:   [ ]  productive cough   [ ]  asthma   [ ]  wheezing NEUROLOGIC:   Valu.Nieves ] weakness  [ ]  paresthesias  [ ]  aphasia  [ ]  amaurosis  [ ]  dizziness HEMATOLOGIC:   Valu.Nieves ] bleeding problems   [ ]  clotting disorders MUSCULOSKELETAL:  [ ]  joint pain   [ ]  joint swelling [ ]  leg swelling GASTROINTESTINAL: [ ]   blood in stool  [ ]   hematemesis GENITOURINARY:  [ ]   dysuria  [ ]   hematuria PSYCHIATRIC:  [ ]  history of major depression INTEGUMENTARY:  [ ]  rashes  [ ]  ulcers CONSTITUTIONAL:  [ ]  fever   [ ]  chills  PHYSICAL EXAM: There were no vitals filed for this visit. GENERAL: The patient is a well-nourished female, in no acute distress. The vital signs are documented above. CARDIOVASCULAR: There is a regular rate and rhythm. I do not detect carotid bruits. She has palpable femoral pulses. I cannot palpate pedal pulses. PULMONARY: There is good air exchange  bilaterally without wheezing or rales. ABDOMEN: Soft and non-tender with normal pitched bowel sounds.  MUSCULOSKELETAL: There are no major deformities or cyanosis. NEUROLOGIC: No focal weakness or paresthesias are detected. 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 On the right side she has some dry gangrene involving the tips of the first third and fourth toes. Currently there is no significant cellulitis or drainage. PSYCHIATRIC:  The patient has a normal affect.  DATA:  I reviewed her previous studies which show ABIs of 100% bilaterally although I think these are falsely elevated. CT scan shows calcific disease in her aorta and iliac arteries bilaterally.  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 I have recommended  That we proceed with arteriography in order to further evaluate her aortoiliac disease as a potential source of embolization. If she had disease amenable to angioplasty this could potentially be addressed to improve circulation and assist healing of her toe wounds and also to prevent further embolization. However, she was just started on Coumadin for atrial fibrillation. She is scheduled to see Dr. Acie Fredrickson next week and I will give him a chance to assess her to determine if it is safe to stop her Coumadin for an arteriogram. In addition, she has a history of thrombocytopenia but her platelet count is up to 80,000. She also has some mild renal insufficiency so we'll have to recheck her creatinine.  I'll discuss the case with Dr. Acie Fredrickson and then will call the patient (cell phone: [phone]) when it is okay to proceed with scheduling her arteriogram and holding her Coumadin. In the meantime, I have instructed her to soak her feet daily N lukewarm diet soap soaks.  Deitra Mayo [facility] [place]: [phone]

## 2014-09-18 ENCOUNTER — Encounter: Payer: Self-pay | Admitting: Internal Medicine

## 2014-09-18 ENCOUNTER — Telehealth: Payer: Self-pay | Admitting: Pharmacist

## 2014-09-18 NOTE — Progress Notes (Signed)
MRN: [id number] Name: Cheyenne Riggs  Sex: female Age: 69 y.o. DOB: 12/01/1945  [place]ta Adelstein #:  Facility/Room: Level Of Care: SNF Provider: Inocencio Homes D Emergency Contacts: Extended Emergency Contact Information Primary Emergency Contact: [facility] Address: [address] [place], [place] [address] Johnnette Litter of [place] Phone: [phone] Mobile Phone: [phone] Relation: Son Secondary Emergency Contact: Latanya Maudlin, [place] of [place] Phone: [phone] Mobile Phone: [phone] Relation: Friend  Code Status:   Allergies: Ambien; Ciprofloxacin; Demerol; Lidocaine; Other; Oxycodone; Septra; Codeine; Novocain; and Vancomycin  Chief Complaint  Patient presents with  . Acute Visit    HPI: Patient is 69 y.o. female who   Past Medical History  Diagnosis Date  . Anxiety   . Back pain   . H/O bladder problems   . Cholelithiasis   . Depression   . High blood pressure   . Hypercholesteremia   . Kidney stones   . Gum disease   . Complication of anesthesia     bp goes up  . Wears glasses   . Wears dentures     top  . Kidney stones   . Cancer   . Breast cancer     Past Surgical History  Procedure Laterality Date  . Colon biopsy    . Mouth surgery    . Spine surgery    . Colonoscopy    . Tubal ligation    . Lithotripsy    . Rhinoplasty    . Back surgery  1989    lumb lam  . Breast biopsy Right 02/05/2014    invasive ductal cncer  . Abdominal hysterectomy  1982  . Cholecystectomy  2011    lap choli  . Radioactive seed guided mastectomy with axillary sentinel lymph node biopsy Right 02/27/2014    Procedure: RADIOACTIVE SEED GUIDED RIGHT PARTIAL MASTECTOMY WITH AXILLARY SENTINEL LYMPH NODE BIOPSY;  Surgeon: Stark Klein, MD;  Location: [facility];  Service: General;  Laterality: Right;  . Portacath placement N/A 02/27/2014    Procedure: INSERTION PORT-A-CATH;  Surgeon: Stark Klein, MD;   Location: [facility];  Service: General;  Laterality: N/A;      Medication List       This list is accurate as of: 09/16/14 11:59 PM.  Always use your most recent med list.               acetaminophen 325 MG tablet  Commonly known as:  TYLENOL  Take 650 mg by mouth every 6 (six) hours as needed for headache (headache).     diphenhydrAMINE 25 MG tablet  Commonly known as:  BENADRYL  Take 25 mg by mouth daily as needed for allergies (allergies).     FLUoxetine 10 MG capsule  Commonly known as:  PROZAC  Take 1 capsule (10 mg total) by mouth daily.     furosemide 40 MG tablet  Commonly known as:  LASIX  Take 1 tablet (40 mg total) by mouth daily.     gabapentin 100 MG capsule  Commonly known as:  NEURONTIN  Take 1 capsule (100 mg total) by mouth 2 (two) times daily.     HYDROcodone-acetaminophen 5-325 MG per tablet  Commonly known as:  NORCO/VICODIN  Take 1 tablet by mouth every 6 (six) hours as needed for moderate pain.     lidocaine-prilocaine cream  Commonly known as:  EMLA  Apply 1 application topically once a week. On Chemo Day (Monday).     LORazepam 0.5 MG tablet  Commonly known as:  ATIVAN  Take 1 tablet (0.5 mg total) by mouth every 8 (eight) hours as needed for anxiety (nausea). Nausea     metoprolol tartrate 25 MG tablet  Commonly known as:  LOPRESSOR  Take 1 tablet (25 mg total) by mouth 2 (two) times daily.     ondansetron 4 MG disintegrating tablet  Commonly known as:  ZOFRAN ODT  4mg  ODT q4 hours prn nausea/vomit     oxyCODONE-acetaminophen 5-325 MG per tablet  Commonly known as:  PERCOCET/ROXICET  Take by mouth every 4 (four) hours as needed for severe pain.     potassium chloride 10 MEQ CR capsule  Commonly known as:  MICRO-K  Take 1 capsule (10 mEq total) by mouth daily.     PROBIOTIC DAILY PO  Take 1 tablet by mouth daily.     warfarin 3 MG tablet  Commonly known as:  COUMADIN  Take 3 mg by mouth daily.        No  orders of the defined types were placed in this encounter.    Immunization History  Administered Date(s) Administered  . Influenza-Unspecified 02/12/2014    History  Substance Use Topics  . Smoking status: Former Smoker -- 0.00 packs/day    Quit date: 05/16/2014  . Smokeless tobacco: Not on file  . Alcohol Use: No    Review of Systems  DATA OBTAINED: from patient, nurse GENERAL:  no fevers, fatigue, appetite changes SKIN: No itching, rash HEENT: No complaint RESPIRATORY: No cough, wheezing, SOB CARDIAC: No chest pain, palpitations, lower extremity edema; B toes-L great and 5th, R great involved but not worse GI: No abdominal pain, No N/V/D or constipation, No heartburn or reflux  GU: No dysuria, frequency or urgency, or incontinence  MUSCULOSKELETAL: No unrelieved bone/joint pain NEUROLOGIC: No headache, dizziness  PSYCHIATRIC: No overt anxiety or sadness  Filed Vitals:   09/16/14 1237  BP: 90/59  Pulse: 89  Temp: 98.3 F (36.8 C)  Resp: 20    Physical Exam  GENERAL APPEARANCE: Alert, conversant, No acute distress  SKIN: see below HEENT: Unremarkable RESPIRATORY: Breathing is even, unlabored. Lung sounds are clear   CARDIOVASCULAR: Heart RRR no murmurs, rubs or gallops. No peripheral edema ; areas of involvement, L great and 5th toes, R great toe- blue areas have matured to black but there are no new areas, some swelling and erythema, no heat or infection GASTROINTESTINAL: Abdomen is soft, non-tender, not distended w/ normal bowel sounds.  GENITOURINARY: Bladder non tender, not distended  MUSCULOSKELETAL: No abnormal joints or musculature NEUROLOGIC: Cranial nerves 2-12 grossly intact. Moves all extremities PSYCHIATRIC: Mood and affect appropriate to situation, no behavioral issues  Patient Active Problem List   Diagnosis Date Noted  . Metabolic acidosis 16/02/9603  . Acute on chronic respiratory failure 09/07/2014  . Acute on chronic combined systolic and  diastolic heart failure 54/01/8118  . 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 Renal failure (ARF), acute on chronic   . Thrombocytopenia   . Palliative care encounter   . Neuropathic pain of both feet   . Elevated liver function tests   . Weakness   . Renal failure 08/26/2014  . Transaminitis 08/26/2014  . Acute renal failure syndrome   . Clostridium difficile colitis 08/24/2014  . Nausea without vomiting 08/24/2014  . Dehydration 08/24/2014  . Hypokalemia 08/24/2014  .  Immobility 08/24/2014  . CKD (chronic kidney disease), stage III 08/18/2014  . Acute systolic heart failure   . Paroxysmal SVT (supraventricular tachycardia) 08/17/2014  . UTI (lower urinary tract infection) 08/17/2014  . Protein-calorie malnutrition, severe 08/17/2014  . Elevated troponin 08/09/2014  . Antineoplastic chemotherapy induced anemia 06/29/2014  . Enteritis due to Clostridium difficile 05/24/2014  . Breast cancer of upper-outer quadrant of right female breast 02/18/2014  . Essential hypertension, benign 02/18/2014    CBC    Component Value Date/Time   WBC 5.8 09/03/2014 0511   WBC 4.9 08/13/2014 1012   RBC 4.11 09/03/2014 0511   RBC 4.34 08/13/2014 1012   HGB 11.8* 09/03/2014 0511   HGB 11.7 08/13/2014 1012   HCT 37.2 09/03/2014 0511   HCT 37.6 08/13/2014 1012   PLT 37* 09/03/2014 0511   PLT 222 08/13/2014 1012   MCV 90.5 09/03/2014 0511   MCV 86.7 08/13/2014 1012   LYMPHSABS 1.0 08/26/2014 1213   LYMPHSABS 0.6* 08/13/2014 1012   MONOABS 0.9 08/26/2014 1213   MONOABS 0.5 08/13/2014 1012   EOSABS 0.0 08/26/2014 1213   EOSABS 0.0 08/13/2014 1012   BASOSABS 0.0 08/26/2014 1213   BASOSABS 0.0 08/13/2014 1012    CMP     Component Value Date/Time   NA 139 09/03/2014 0511   NA 143 08/24/2014 1223   K 3.2* 09/03/2014 0511   K 3.4* 08/24/2014 1223   CL 96 09/03/2014 0511   CO2 35* 09/03/2014 0511   CO2 24 08/24/2014 1223   GLUCOSE 114* 09/03/2014 0511   GLUCOSE 114 08/24/2014 1223    BUN 27* 09/03/2014 0511   BUN 8.9 08/24/2014 1223   CREATININE 1.24* 09/03/2014 0511   CREATININE 1.2* 08/24/2014 1223   CALCIUM 8.6 09/03/2014 0511   CALCIUM 8.9 08/24/2014 1223   PROT 4.8* 09/03/2014 0511   PROT 5.8* 08/13/2014 1012   ALBUMIN 2.4* 09/03/2014 0511   ALBUMIN 3.1* 08/13/2014 1012   AST 77* 09/03/2014 0511   AST 18 08/13/2014 1012   ALT 47* 09/03/2014 0511   ALT 6 08/13/2014 1012   ALKPHOS 183* 09/03/2014 0511   ALKPHOS 70 08/13/2014 1012   BILITOT 1.8* 09/03/2014 0511   BILITOT 0.72 08/13/2014 1012   GFRNONAA 44* 09/03/2014 0511   GFRAA 51* 09/03/2014 0511    Assessment and Plan  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 Dr Scot Dock, vascular saw her today and and felt that these lesions were embolic from her aorto-occlusive dx, she also has a fib, but would like to do arteriography for further investigation. He asked for pt to soak feet in warm water several times daily. Plan to continue coumadin into theraputic range.   Pt was seem 5/5 2016. Hennie Duos, MD

## 2014-09-18 NOTE — Assessment & Plan Note (Signed)
The areas of infarcts on toes have aged and are black but there are no new areas since the  start of coumadin, which is encouraging. Dr Scot Dock, vascular saw her today and and felt that these lesions were embolic from her aorto-occlusive dx, she also has a fib, but would like to do arteriography for further investigation. He asked for pt to soak feet in warm water several times daily. Plan to continue coumadin into theraputic range.

## 2014-09-18 NOTE — Telephone Encounter (Signed)
-----   Message from Thayer Headings, MD sent at 09/17/2014  6:51 PM EDT ----- Regarding: RE: coumadin I think we can stop it any time. We have the coumadin clinic manage this so that they can arrange for follow up INR levels.   Would probably hold for 5-7 days. I have copied our pharmacist with this.  Let us know when you want to do the procedure and we will instruct the patient re: coumadin.  Thanks.  Phil   ----- Message -----    From: Angelia Mould, MD    Sent: 09/16/2014   3:33 PM      To: Thayer Headings, MD Subject: coumadin                                       Hi Phil,  This is a complicated patient who you are scheduled to see next week. She may have had atheroembolic disease to both feet and at some point I need to do an aortogram and runoff. She was recently diagnosed with atrial fibrillation although she may be in sinus rhythm at this point. She was started on Coumadin in April for atrial fibrillation. If you could help me decide when it would be safe to stop her Coumadin for an arteriogram that would be helpful. You're scheduled to see her next week. Thanks Gerald Stabs

## 2014-09-23 ENCOUNTER — Other Ambulatory Visit: Payer: Self-pay

## 2014-09-23 DIAGNOSIS — I70235 Atherosclerosis of native arteries of right leg with ulceration of other part of foot: Secondary | ICD-10-CM | POA: Diagnosis not present

## 2014-09-23 DIAGNOSIS — I70245 Atherosclerosis of native arteries of left leg with ulceration of other part of foot: Secondary | ICD-10-CM | POA: Diagnosis not present

## 2014-09-23 DIAGNOSIS — I70244 Atherosclerosis of native arteries of left leg with ulceration of heel and midfoot: Secondary | ICD-10-CM | POA: Diagnosis not present

## 2014-09-24 ENCOUNTER — Ambulatory Visit (INDEPENDENT_AMBULATORY_CARE_PROVIDER_SITE_OTHER): Payer: Medicare Other | Admitting: Cardiovascular Disease

## 2014-09-24 ENCOUNTER — Encounter: Payer: Self-pay | Admitting: Cardiovascular Disease

## 2014-09-24 VITALS — BP 144/76 | HR 72 | Ht 63.0 in | Wt 170.0 lb

## 2014-09-24 DIAGNOSIS — I5043 Acute on chronic combined systolic (congestive) and diastolic (congestive) heart failure: Secondary | ICD-10-CM | POA: Diagnosis not present

## 2014-09-24 DIAGNOSIS — I480 Paroxysmal atrial fibrillation: Secondary | ICD-10-CM

## 2014-09-24 DIAGNOSIS — I5022 Chronic systolic (congestive) heart failure: Secondary | ICD-10-CM | POA: Diagnosis not present

## 2014-09-24 MED ORDER — APIXABAN 5 MG PO TABS: 5.0000 mg | ORAL_TABLET | Freq: Two times a day (BID) | ORAL | Status: DC

## 2014-09-24 NOTE — Patient Instructions (Addendum)
Medication Instructions:  START Eliquis 5 mg twice daily - start after your angiogram  Labwork: You will need to have lab work when you return for [facility] appointment (CBC, BMET) to check on your Eliquis  Testing/Procedures: None Ordered  Follow-Up: Your physician recommends that you return for follow-up appointment with Dr. Acie Fredrickson Your physician recommends that you schedule a follow-up appointment in: 1 month with [facility] ([facility])    For your  leg edema you  should do  the following 1. Leg elevation - I recommend the Lounge Dr. Leg rest.  See below for details  2. Salt restriction  -  Use potassium chloride instead of regular salt as a salt substitute. 3. Walk regularly 4. Compression hose - [facility] 5. Weight loss     Go to [email or web address]

## 2014-09-24 NOTE — Progress Notes (Signed)
Cardiology Office Note   Date:  09/24/2014   ID:  Cheyenne Riggs, Cheyenne Riggs May 29, 1945, MRN [id number]  PCP:  Cheyenne Eisenmenger, MD  Cardiologist:   Cheyenne Headings, MD   Chief Complaint  Patient presents with  . hypertension   1. 1. Acute combined systolic and diastolic CHF:  2. Essential HTN:   3. Breast cancer :  4. Atrial fibrillation     Sep 24, 2014 URIAH Riggs is a 69 y.o. female who presents for chronic systolic CHF  She has had many ER visits. Was hospitalized with atrial fib. 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  Looks like embolic disease  . Has developed a pressure sore on left heal . Has seen Cheyenne Riggs who wants to do a peripheral angiogram next week   Has bilateral foot swelling , progresses during the day, improves, / resolves overnight when she is in bed. She was started on Lasix ( but this also eventually resulted in a hospital admission for dehydration     Past Medical History  Diagnosis Date  . Anxiety   . Back pain   . H/O bladder problems   . Cholelithiasis   . Depression   . High blood pressure   . Hypercholesteremia   . Kidney stones   . Gum disease   . Complication of anesthesia     bp goes up  . Wears glasses   . Wears dentures     top  . Kidney stones   . Cancer   . Breast cancer     Past Surgical History  Procedure Laterality Date  . Colon biopsy    . Mouth surgery    . Spine surgery    . Colonoscopy    . Tubal ligation    . Lithotripsy    . Rhinoplasty    . Back surgery  1989    lumb lam  . Breast biopsy Right 02/05/2014    invasive ductal cncer  . Abdominal hysterectomy  1982  . Cholecystectomy  2011    lap choli  . Radioactive seed guided mastectomy with axillary sentinel lymph node biopsy Right 02/27/2014    Procedure: RADIOACTIVE SEED GUIDED RIGHT PARTIAL MASTECTOMY WITH AXILLARY SENTINEL LYMPH NODE BIOPSY;  Surgeon: Cheyenne Klein, MD;  Location: [facility];  Service: General;  Laterality: Right;  . Portacath placement N/A 02/27/2014    Procedure: INSERTION PORT-A-CATH;  Surgeon: Cheyenne Klein, MD;  Location: [facility];  Service: General;  Laterality: N/A;     Current Outpatient Prescriptions  Medication Sig Dispense Refill  . diphenhydrAMINE (BENADRYL) 25 MG tablet Take 25 mg by mouth daily as needed for allergies (allergies).     Marland Kitchen FLUoxetine (PROZAC) 10 MG capsule Take 1 capsule (10 mg total) by mouth daily. 30 capsule 1  . gabapentin (NEURONTIN) 100 MG capsule Take 1 capsule (100 mg total) by mouth 2 (two) times daily.    Marland Kitchen lidocaine-prilocaine (EMLA) cream Apply 1 application topically once a week. On Chemo Day (Monday).  0  . LORazepam (ATIVAN) 0.5 MG tablet Take 1 tablet (0.5 mg total) by mouth every 8 (eight) hours as needed for anxiety (nausea). Nausea 15 tablet 0  . metoprolol tartrate (LOPRESSOR) 25 MG tablet Take 1 tablet (25 mg total) by mouth 2 (two) times daily. 60 tablet 3  . ondansetron (ZOFRAN ODT) 4 MG disintegrating tablet 4mg  ODT q4  hours prn nausea/vomit (Patient taking differently: Take 4 mg by mouth every 4 (four) hours as needed for nausea or vomiting. ) 15 tablet 0  . oxyCODONE-acetaminophen (PERCOCET/ROXICET) 5-325 MG per tablet Take by mouth every 4 (four) hours as needed for severe pain.    . potassium chloride (MICRO-K) 10 MEQ CR capsule Take 1 capsule (10 mEq total) by mouth daily. 30 capsule 0  . Probiotic Product (PROBIOTIC DAILY PO) Take 1 tablet by mouth daily.     . furosemide (LASIX) 40 MG tablet Take 1 tablet (40 mg total) by mouth daily. (Patient not taking: Reported on 09/24/2014) 30 tablet 0   No current facility-administered medications for this visit.    Allergies:   Ambien; Ciprofloxacin; Demerol; Lidocaine; Other; Oxycodone; Septra; Codeine; Novocain; and Vancomycin    Social History:  The patient  reports that she quit smoking about 4 months ago. She does not have any smokeless  tobacco history on file. She reports that she does not drink alcohol or use illicit drugs.   Family History:  The patient's family history includes Cancer in her father; Cancer - Other in her father; Heart failure in her sister; Hyperlipidemia in her mother; Hypertension in her brother, mother, and sister; Stroke in her mother. There is no history of Heart attack.    ROS:  Please see the history of present illness.    Review of Systems: Constitutional:  admits to poor appetite   HEENT: denies photophobia, eye pain, redness, hearing loss, ear pain, congestion, sore throat, rhinorrhea, sneezing, neck pain, neck stiffness and tinnitus.  Respiratory: admits to SOB, DOE, cough, chest tightness, and wheezing.  Cardiovascular: admits to   leg swelling.  Gastrointestinal: admits to nausea, vomiting,   Genitourinary: denies dysuria, urgency, frequency, hematuria, flank pain and difficulty urinating.  Musculoskeletal: denies  myalgias, back pain, joint swelling, arthralgias and gait problem.   Skin: denies pallor, rash and wound.  Neurological: denies dizziness, seizures, syncope, weakness, light-headedness, numbness and headaches.   Hematological: denies adenopathy, easy bruising, personal or family bleeding history.  Psychiatric/ Behavioral: denies suicidal ideation, mood changes, confusion, nervousness, sleep disturbance and agitation.       All other systems are reviewed and negative.    PHYSICAL EXAM: VS:  BP 144/76 mmHg  Pulse 72  Ht 5\' 3"  (1.6 m)  Wt 77.111 kg (170 lb)  BMI 30.12 kg/m2 , BMI Body mass index is 30.12 kg/(m^2). GEN: Well nourished, well developed, in no acute distress HEENT: normal Neck: no JVD, carotid bruits, or masses Cardiac: RRR; no murmurs, rubs, or gallops, 2+ edema  Respiratory:  clear to auscultation bilaterally, normal work of breathing GI: soft, nontender, nondistended, + BS MS:  Necrotic left great toe,  Areas of necrosis on the toes on right foot.    Skin: warm and dry, no rash Neuro:  Strength and sensation are intact Psych: normal   EKG:  EKG is ordered today. The ekg ordered today demonstrates :  NSR at 73.  TWI in inferiolateral leads    Recent Labs: 08/17/2014: B Natriuretic Peptide 1980.9*; TSH 1.792 08/26/2014: Magnesium 1.8 09/03/2014: ALT 47*; BUN 27*; Creatinine 1.24*; Hemoglobin 11.8*; Platelets 37*; Potassium 3.2*; Sodium 139    Lipid Panel No results found for: CHOL, TRIG, HDL, CHOLHDL, VLDL, LDLCALC, LDLDIRECT    Wt Readings from Last 3 Encounters:  09/24/14 77.111 kg (170 lb)  09/16/14 76.204 kg (168 lb)  09/04/14 87.408 kg (192 lb 11.2 oz)      Other studies Reviewed:  Additional studies/ records that were reviewed today include: . Review of the above records demonstrates:    ASSESSMENT AND PLAN:  1. Acute combined systolic and diastolic CHF: She is on Adriamycin. Her echo in Oct. Showed the initial stages of CHf with a reduced global longitudinal strain of -15.4%.   most recent echo shows EF 35%. She is on metoprolol We started Lisinopril in the hospital several months ago.  It has been stopped - I presume due to worsening renal function. Will hold off on starting ACE or ARB for now  2. Essential HTN: She was on Diovan / HCTZ for years but stopped it when her BP dropped ( after she started chemotherapy). Its' possible that she needs a low dose diuretic to keep her volume status stable.  3. Breast cancer : Plans per Oncology.   She continues to have significant issues with the chemotherapy.  Her oncologist has offered stopping / pausing  Chemotherapy for a while   4. Atrial fibrillation:  Was on coumadin,  Stopped for her angiogram next week .  I would favor starting Eliquis 5 bid after her procedure.   Will give her samples and a script.  She has been instructed to hold it until Dr. Scot Dock has completed his work up .     Current medicines are reviewed at length with the patient today.  The  patient does not have concerns regarding medicines.  The following changes have been made:  no change  Labs/ tests ordered today include:  No orders of the defined types were placed in this encounter.     Disposition:   FU with 3 months      Kaeden Mester, Wonda Cheng, MD  09/24/2014 5:25 PM    [facility] [place], [place], [place]  [address] Phone: [phone]; Fax: [phone]   [facility]  [address] [place], [place]  [address] [phone]    Fax [phone]

## 2014-09-24 NOTE — Telephone Encounter (Signed)
Pt's surgery has been scheduled for 5/16.  She was given instructions by Dr. Nicole Cella office yesterday to start holding Coumadin.

## 2014-09-25 ENCOUNTER — Other Ambulatory Visit: Payer: Self-pay | Admitting: *Deleted

## 2014-09-25 NOTE — Patient Outreach (Signed)
[facility]) Care Management  09/25/2014  Cheyenne Riggs [birth year] [id number]  Phone call to patient to assess for social work needs.  Voice mail message left for a return call.     Sheralyn Boatman [facility] Care Management [phone]

## 2014-09-25 NOTE — Patient Outreach (Signed)
[facility]) Care Management  09/25/2014  Cheyenne Riggs Dec 27, 1945 [id number]   Return call from patient who states that she is still at [facility].  Per patient, she is seeing good results from the Physical and Occupational Therapies.  Patient states that she is abel to dress herself, use her wheelchair and get to the bathroom.  Per patient, her feet remain swollen and she is scheduled for a Dye test on Monday.   Patient discussed a limited support network.  Her son is as supportive as he can be, however he lives in [place].  Patient plans to sell her home in the near future as it is a 2 story home.  Her son has helped her to transition to the first floor only.  She is also possibly looking at hiring a custodial aid for assistance in the evening.  This Education officer, museum will follow up with patient next week to continue to assess for discharge needs.Cheyenne Riggs [facility] Care Management [phone]

## 2014-09-28 ENCOUNTER — Encounter (HOSPITAL_COMMUNITY)
Admission: RE | Disposition: A | Discharge status: Home / Self Care | Payer: Medicare Other | Source: Ambulatory Visit | Attending: Vascular Surgery

## 2014-09-28 ENCOUNTER — Ambulatory Visit (HOSPITAL_COMMUNITY)
Admission: RE | Admit: 2014-09-28 | Discharge: 2014-09-28 | Disposition: A | Discharge status: Home / Self Care | Payer: Medicare Other | Source: Ambulatory Visit | Attending: Vascular Surgery | Admitting: Vascular Surgery

## 2014-09-28 ENCOUNTER — Encounter (HOSPITAL_COMMUNITY): Payer: Self-pay | Admitting: Vascular Surgery

## 2014-09-28 DIAGNOSIS — I701 Atherosclerosis of renal artery: Secondary | ICD-10-CM | POA: Insufficient documentation

## 2014-09-28 DIAGNOSIS — Z886 Allergy status to analgesic agent status: Secondary | ICD-10-CM | POA: Insufficient documentation

## 2014-09-28 DIAGNOSIS — Z87442 Personal history of urinary calculi: Secondary | ICD-10-CM | POA: Insufficient documentation

## 2014-09-28 DIAGNOSIS — Z882 Allergy status to sulfonamides status: Secondary | ICD-10-CM | POA: Insufficient documentation

## 2014-09-28 DIAGNOSIS — L97529 Non-pressure chronic ulcer of other part of left foot with unspecified severity: Secondary | ICD-10-CM

## 2014-09-28 DIAGNOSIS — Z7901 Long term (current) use of anticoagulants: Secondary | ICD-10-CM | POA: Insufficient documentation

## 2014-09-28 DIAGNOSIS — F419 Anxiety disorder, unspecified: Secondary | ICD-10-CM | POA: Insufficient documentation

## 2014-09-28 DIAGNOSIS — L97519 Non-pressure chronic ulcer of other part of right foot with unspecified severity: Secondary | ICD-10-CM | POA: Diagnosis present

## 2014-09-28 DIAGNOSIS — I75023 Atheroembolism of bilateral lower extremities: Secondary | ICD-10-CM | POA: Diagnosis not present

## 2014-09-28 DIAGNOSIS — I4891 Unspecified atrial fibrillation: Secondary | ICD-10-CM | POA: Insufficient documentation

## 2014-09-28 DIAGNOSIS — Z881 Allergy status to other antibiotic agents status: Secondary | ICD-10-CM | POA: Insufficient documentation

## 2014-09-28 DIAGNOSIS — I10 Essential (primary) hypertension: Secondary | ICD-10-CM | POA: Insufficient documentation

## 2014-09-28 DIAGNOSIS — Z888 Allergy status to other drugs, medicaments and biological substances status: Secondary | ICD-10-CM | POA: Insufficient documentation

## 2014-09-28 DIAGNOSIS — C50919 Malignant neoplasm of unspecified site of unspecified female breast: Secondary | ICD-10-CM | POA: Insufficient documentation

## 2014-09-28 DIAGNOSIS — Z9049 Acquired absence of other specified parts of digestive tract: Secondary | ICD-10-CM | POA: Insufficient documentation

## 2014-09-28 DIAGNOSIS — E780 Pure hypercholesterolemia: Secondary | ICD-10-CM | POA: Insufficient documentation

## 2014-09-28 DIAGNOSIS — Z87891 Personal history of nicotine dependence: Secondary | ICD-10-CM | POA: Insufficient documentation

## 2014-09-28 DIAGNOSIS — F329 Major depressive disorder, single episode, unspecified: Secondary | ICD-10-CM | POA: Insufficient documentation

## 2014-09-28 HISTORY — PX: PERIPHERAL VASCULAR CATHETERIZATION: SHX172C

## 2014-09-28 LAB — POCT I-STAT, CHEM 8
BUN: 8 mg/dL (ref 6–20)
CHLORIDE: 104 mmol/L (ref 101–111)
Calcium, Ion: 1.33 mmol/L — ABNORMAL HIGH (ref 1.13–1.30)
Creatinine, Ser: 0.9 mg/dL (ref 0.44–1.00)
Glucose, Bld: 79 mg/dL (ref 65–99)
HEMATOCRIT: 39 % (ref 36.0–46.0)
Hemoglobin: 13.3 g/dL (ref 12.0–15.0)
POTASSIUM: 4 mmol/L (ref 3.5–5.1)
SODIUM: 144 mmol/L (ref 135–145)
TCO2: 22 mmol/L (ref 0–100)

## 2014-09-28 LAB — CBC
HCT: 35.8 % — ABNORMAL LOW (ref 36.0–46.0)
Hemoglobin: 11.5 g/dL — ABNORMAL LOW (ref 12.0–15.0)
MCH: 28.8 pg (ref 26.0–34.0)
MCHC: 32.1 g/dL (ref 30.0–36.0)
MCV: 89.5 fL (ref 78.0–100.0)
Platelets: 185 10*3/uL (ref 150–400)
RBC: 4 MIL/uL (ref 3.87–5.11)
RDW: 18.8 % — ABNORMAL HIGH (ref 11.5–15.5)
WBC: 4.4 10*3/uL (ref 4.0–10.5)

## 2014-09-28 LAB — CREATININE, SERUM
Creatinine, Ser: 1 mg/dL (ref 0.44–1.00)
GFR calc non Af Amer: 57 mL/min — ABNORMAL LOW (ref >60)

## 2014-09-28 LAB — GLUCOSE, CAPILLARY: Glucose-Capillary: 75 mg/dL (ref 65–99)

## 2014-09-28 LAB — PROTIME-INR
INR: 1.16 (ref 0.00–1.49)
Prothrombin Time: 15 seconds (ref 11.6–15.2)

## 2014-09-28 SURGERY — ABDOMINAL AORTOGRAM

## 2014-09-28 MED ORDER — FENTANYL CITRATE (PF) 100 MCG/2ML IJ SOLN
INTRAMUSCULAR | Status: DC | PRN
  Administered 2014-09-28: 50 ug via INTRAVENOUS

## 2014-09-28 MED ORDER — MIDAZOLAM HCL 2 MG/2ML IJ SOLN
INTRAMUSCULAR | Status: AC
  Filled 2014-09-28: qty 2

## 2014-09-28 MED ORDER — SODIUM CHLORIDE 0.9 % IV SOLN
1.0000 mL/kg/h | INTRAVENOUS | Status: DC
Start: 2014-09-28 — End: 2014-09-28

## 2014-09-28 MED ORDER — LIDOCAINE HCL (PF) 1 % IJ SOLN
INTRAMUSCULAR | Status: AC
  Filled 2014-09-28: qty 30

## 2014-09-28 MED ORDER — IODIXANOL 320 MG/ML IV SOLN
INTRAVENOUS | Status: DC | PRN
  Administered 2014-09-28: 175 mL via INTRAVENOUS

## 2014-09-28 MED ORDER — HEPARIN (PORCINE) IN NACL 2-0.9 UNIT/ML-% IJ SOLN
INTRAMUSCULAR | Status: AC
  Filled 2014-09-28: qty 1000

## 2014-09-28 MED ORDER — SODIUM CHLORIDE 0.9 % IV SOLN
INTRAVENOUS | Status: DC
  Administered 2014-09-28: 1000 mL via INTRAVENOUS

## 2014-09-28 MED ORDER — MIDAZOLAM HCL 2 MG/2ML IJ SOLN
INTRAMUSCULAR | Status: DC | PRN
  Administered 2014-09-28: 1 mg via INTRAVENOUS

## 2014-09-28 MED ORDER — FENTANYL CITRATE (PF) 100 MCG/2ML IJ SOLN
INTRAMUSCULAR | Status: AC
  Filled 2014-09-28: qty 2

## 2014-09-28 SURGICAL SUPPLY — 9 items
CATH CROSS OVER TEMPO 5F (CATHETERS) ×2 IMPLANT
CATH STRAIGHT 5FR 65CM (CATHETERS) ×2 IMPLANT
KIT MICROINTRODUCER STIFF 5F (SHEATH) ×2 IMPLANT
KIT PV (KITS) ×3 IMPLANT
SHEATH PINNACLE 5F 10CM (SHEATH) ×2 IMPLANT
SYR MEDRAD MARK V 150ML (SYRINGE) IMPLANT
TRANSDUCER W/STOPCOCK (MISCELLANEOUS) ×3 IMPLANT
TRAY PV CATH (CUSTOM PROCEDURE TRAY) ×3 IMPLANT
WIRE HITORQ VERSACORE ST 145CM (WIRE) ×2 IMPLANT

## 2014-09-28 NOTE — H&P (View-Only) (Signed)
[facility] [place]  Patient name: Cheyenne Riggs MRN: [id number] DOB: 12/16/1945 Sex: female  REASON FOR VISIT: ischemic toes both feet  HPI: Cheyenne Riggs is a 69 y.o. female who I saw as a consult in the hospital on 09/03/2014. The patient has a history of breast cancer. She had been undergoing chemotherapy and was admitted with fatigue. She apparently had decided to stop taking chemotherapy she could not tolerate this any more. During the admission she was noted to have ischemic toes and vascular surgery was consult. Of note, she was also diagnosed with atrial fibrillation during that admission and was started on heparin and subsequently has been started on Coumadin.  She denies any significant claudication prior to her recent hospitalization although I think her activity was fairly limited. She does describe some cramps in her calves but I do not get any clear-cut history of rest pain or history of nonhealing ulcers previously.  She had arterial Doppler studies during this admission which showed ABIs of 100% bilaterally. However I felt these may be falsely elevated because of her calcific disease. On the right side she had a monophasic anterior tibial signal and a biphasic posterior tibial signal. On the left side she had a monophasic dorsalis pedis signal.  She also had a CT scan of the abdomen and pelvis that admission which showed aortoiliac occlusive disease. I felt that this could potentially be a source of embolization.  She had some mottling of her left thumb and index finger, her right fourth and fifth fingers, and her left great toe and fifth toe. I felt that this could potentially be related to her thrombocytopenia however this has improved. Her most recent platelet count was 80,000. I discussed her case over the phone today with Dr. Olam Idler.  Of note she is on 4 mg of Coumadin daily.  Past Medical History  Diagnosis Date  . Anxiety   . Back pain   .  H/O bladder problems   . Cholelithiasis   . Depression   . High blood pressure   . Hypercholesteremia   . Kidney stones   . Gum disease   . Complication of anesthesia     bp goes up  . Wears glasses   . Wears dentures     top  . Kidney stones   . Cancer   . Breast cancer    Family History  Problem Relation Age of Onset  . Stroke Mother     onset in her 51's  . Cancer - Other Father     eye cancer, black lung diseae  . Heart failure Sister   . Hypertension Brother    SOCIAL HISTORY: History  Substance Use Topics  . Smoking status: Former Smoker -- 0.00 packs/day    Quit date: 05/16/2014  . Smokeless tobacco: Not on file  . Alcohol Use: No   Allergies  Allergen Reactions  . Ambien [Zolpidem Tartrate] Other (See Comments)    Per Pt: causes sleepwalking, makes loopy.  . Ciprofloxacin Nausea And Vomiting  . Demerol [Meperidine] Hives  . Lidocaine     palpatations  . Other Swelling    Eye ointments  . Oxycodone Other (See Comments)    Pt states that it makes her feel "loopy and crazy"  . Septra [Sulfamethoxazole-Trimethoprim] Nausea And Vomiting  . Codeine Rash  . Novocain [Procaine] Palpitations  . Vancomycin Rash   Current Outpatient Prescriptions  Medication Sig Dispense Refill  . acetaminophen (TYLENOL) 325  MG tablet Take 650 mg by mouth every 6 (six) hours as needed for headache (headache).     . diphenhydrAMINE (BENADRYL) 25 MG tablet Take 25 mg by mouth daily as needed for allergies (allergies).     Marland Kitchen FLUoxetine (PROZAC) 10 MG capsule Take 1 capsule (10 mg total) by mouth daily. 30 capsule 1  . furosemide (LASIX) 40 MG tablet Take 1 tablet (40 mg total) by mouth daily. 30 tablet 0  . gabapentin (NEURONTIN) 100 MG capsule Take 1 capsule (100 mg total) by mouth 2 (two) times daily.    Marland Kitchen HYDROcodone-acetaminophen (NORCO/VICODIN) 5-325 MG per tablet Take 1 tablet by mouth every 6 (six) hours as needed for moderate pain. 30 tablet 0  . lidocaine-prilocaine (EMLA)  cream Apply 1 application topically once a week. On Chemo Day (Monday).  0  . LORazepam (ATIVAN) 0.5 MG tablet Take 1 tablet (0.5 mg total) by mouth every 8 (eight) hours as needed for anxiety (nausea). Nausea 15 tablet 0  . metoprolol tartrate (LOPRESSOR) 25 MG tablet Take 1 tablet (25 mg total) by mouth 2 (two) times daily. 60 tablet 3  . ondansetron (ZOFRAN ODT) 4 MG disintegrating tablet 4mg  ODT q4 hours prn nausea/vomit (Patient taking differently: Take 4 mg by mouth every 4 (four) hours as needed for nausea or vomiting. ) 15 tablet 0  . potassium chloride (MICRO-K) 10 MEQ CR capsule Take 1 capsule (10 mEq total) by mouth daily. 30 capsule 0  . Probiotic Product (PROBIOTIC DAILY PO) Take 1 tablet by mouth daily.      No current facility-administered medications for this visit.   REVIEW OF SYSTEMS: Valu.Nieves ] denotes positive finding; [  ] denotes negative finding  CARDIOVASCULAR:  [ ]  chest pain   [ ]  chest pressure   [ ]  palpitations   [ ]  orthopnea   Valu.Nieves ] dyspnea on exertion   [ ]  claudication   [ ]  rest pain   [ ]  DVT   [ ]  phlebitis PULMONARY:   [ ]  productive cough   [ ]  asthma   [ ]  wheezing NEUROLOGIC:   Valu.Nieves ] weakness  [ ]  paresthesias  [ ]  aphasia  [ ]  amaurosis  [ ]  dizziness HEMATOLOGIC:   Valu.Nieves ] bleeding problems   [ ]  clotting disorders MUSCULOSKELETAL:  [ ]  joint pain   [ ]  joint swelling [ ]  leg swelling GASTROINTESTINAL: [ ]   blood in stool  [ ]   hematemesis GENITOURINARY:  [ ]   dysuria  [ ]   hematuria PSYCHIATRIC:  [ ]  history of major depression INTEGUMENTARY:  [ ]  rashes  [ ]  ulcers CONSTITUTIONAL:  [ ]  fever   [ ]  chills  PHYSICAL EXAM: There were no vitals filed for this visit. GENERAL: The patient is a well-nourished female, in no acute distress. The vital signs are documented above. CARDIOVASCULAR: There is a regular rate and rhythm. I do not detect carotid bruits. She has palpable femoral pulses. I cannot palpate pedal pulses. PULMONARY: There is good air exchange  bilaterally without wheezing or rales. ABDOMEN: Soft and non-tender with normal pitched bowel sounds.  MUSCULOSKELETAL: There are no major deformities or cyanosis. NEUROLOGIC: No focal weakness or paresthesias are detected. 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 On the right side she has some dry gangrene involving the tips of the first third and fourth toes. Currently there is no significant cellulitis or drainage. PSYCHIATRIC:  The patient has a normal affect.  DATA:  I reviewed her previous studies which show ABIs of 100% bilaterally although I think these are falsely elevated. CT scan shows calcific disease in her aorta and iliac arteries bilaterally.  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 I have recommended  That we proceed with arteriography in order to further evaluate her aortoiliac disease as a potential source of embolization. If she had disease amenable to angioplasty this could potentially be addressed to improve circulation and assist healing of her toe wounds and also to prevent further embolization. However, she was just started on Coumadin for atrial fibrillation. She is scheduled to see Dr. Acie Fredrickson next week and I will give him a chance to assess her to determine if it is safe to stop her Coumadin for an arteriogram. In addition, she has a history of thrombocytopenia but her platelet count is up to 80,000. She also has some mild renal insufficiency so we'll have to recheck her creatinine.  I'll discuss the case with Dr. Acie Fredrickson and then will call the patient (cell phone: [phone]) when it is okay to proceed with scheduling her arteriogram and holding her Coumadin. In the meantime, I have instructed her to soak her feet daily N lukewarm diet soap soaks.  Deitra Mayo [facility] [place]: [phone]

## 2014-09-28 NOTE — Discharge Instructions (Signed)

## 2014-09-28 NOTE — Interval H&P Note (Signed)
History and Physical Interval Note:  09/28/2014 8:53 AM  Cheyenne Riggs  has presented today for surgery, with the diagnosis of grengreen toes bilaterally  The various methods of treatment have been discussed with the patient and family. After consideration of risks, benefits and other options for treatment, the patient has consented to  Procedure(s): Abdominal Aortogram (N/A) as a surgical intervention .  The patient's history has been reviewed, patient examined, no change in status, stable for surgery.  I have reviewed the patient's chart and labs.  Questions were answered to the patient's satisfaction.     Deitra Mayo

## 2014-09-28 NOTE — Progress Notes (Signed)
Site area: rt groin Site Prior to Removal:  Level  0 Pressure Applied For:  20 minutes Manual:   yes Patient Status During Pull:  stable Post Pull Site:  Level  0 Post Pull Instructions Given:  yes Post Pull Pulses Present: yes Dressing Applied:   tegaderm Bedrest begins @  1120 Comments:  none

## 2014-09-29 ENCOUNTER — Telehealth: Payer: Self-pay | Admitting: Cardiovascular Disease

## 2014-09-29 MED FILL — Lidocaine HCl Local Preservative Free (PF) Inj 1%: INTRAMUSCULAR | Qty: 30 | Status: AC

## 2014-09-29 MED FILL — Heparin Sodium (Porcine) 2 Unit/ML in Sodium Chloride 0.9%: INTRAMUSCULAR | Qty: 1000 | Status: AC

## 2014-09-29 NOTE — Telephone Encounter (Signed)
Called patient back. Per Dr. Elmarie Shiley office visit note, patient is to start taking Eliquis after her procedure with Dr. Scot Dock. Patient had procedure yesterday. Informed her that she should start taking Eliquis, but check with Dr. Scot Dock about when she can start back on her blood thinner. Patient will call Dr. Nicole Cella office. Informed patient that she is taking Eliquis for her A. Fib. Patient verbalized understanding.

## 2014-09-29 NOTE — Telephone Encounter (Signed)
New message     Patient calling wants to know does she need to continue taken eliquis

## 2014-09-30 ENCOUNTER — Telehealth: Payer: Self-pay | Admitting: Vascular Surgery

## 2014-09-30 DIAGNOSIS — I70245 Atherosclerosis of native arteries of left leg with ulceration of other part of foot: Secondary | ICD-10-CM | POA: Diagnosis not present

## 2014-09-30 DIAGNOSIS — I70235 Atherosclerosis of native arteries of right leg with ulceration of other part of foot: Secondary | ICD-10-CM | POA: Diagnosis not present

## 2014-09-30 DIAGNOSIS — I70244 Atherosclerosis of native arteries of left leg with ulceration of heel and midfoot: Secondary | ICD-10-CM | POA: Diagnosis not present

## 2014-09-30 NOTE — Telephone Encounter (Signed)
-----   Message from Mena Goes, RN sent at 09/28/2014  4:18 PM EDT ----- Regarding: Schedule   ----- Message -----    From: Angelia Mould, MD    Sent: 09/28/2014  10:30 AM      To: [facility] Charge Pool Subject: charge                                         PROCEDURE:  1. Ultrasound-guided access to the right common femoral artery 2. Aortogram with bilateral iliac arteriogram and bilateral lower extreme runoff 3. Selective catheterization of the left external iliac artery with left lower extremity  SURGEON: Judeth Cornfield. Scot Dock, MD, FACS  She will need a follow up visit in 6-8 weeks. Thank you. CD

## 2014-09-30 NOTE — Telephone Encounter (Signed)
Spoke with pt re appt, dpm  °

## 2014-10-07 ENCOUNTER — Other Ambulatory Visit: Payer: Self-pay | Admitting: *Deleted

## 2014-10-07 DIAGNOSIS — I70245 Atherosclerosis of native arteries of left leg with ulceration of other part of foot: Secondary | ICD-10-CM | POA: Diagnosis not present

## 2014-10-07 DIAGNOSIS — I70244 Atherosclerosis of native arteries of left leg with ulceration of heel and midfoot: Secondary | ICD-10-CM | POA: Diagnosis not present

## 2014-10-07 DIAGNOSIS — I70235 Atherosclerosis of native arteries of right leg with ulceration of other part of foot: Secondary | ICD-10-CM | POA: Diagnosis not present

## 2014-10-07 NOTE — Patient Outreach (Signed)
[facility]) Care Management  10/07/2014  DESTANE SPEAS [birth year] [id number]    Phone call to patient to follow up with her following her discharge from the skilled nursing facility.  Voicemail message left for a return call.  Sheralyn Boatman [facility] Care Management [phone]

## 2014-10-13 ENCOUNTER — Telehealth: Payer: Self-pay | Admitting: Hematology and Oncology

## 2014-10-13 NOTE — Telephone Encounter (Signed)
Returned a call to [employer] at [facility] as the patient will be d/c soon,and per terri she needs a f/u next avail,done

## 2014-10-14 ENCOUNTER — Encounter: Payer: Self-pay | Admitting: Internal Medicine

## 2014-10-14 ENCOUNTER — Non-Acute Institutional Stay (SKILLED_NURSING_FACILITY): Payer: Medicare Other | Admitting: Internal Medicine

## 2014-10-14 DIAGNOSIS — R7989 Other specified abnormal findings of blood chemistry: Secondary | ICD-10-CM

## 2014-10-14 DIAGNOSIS — I471 Supraventricular tachycardia: Secondary | ICD-10-CM

## 2014-10-14 DIAGNOSIS — N189 Chronic kidney disease, unspecified: Secondary | ICD-10-CM

## 2014-10-14 DIAGNOSIS — N179 Acute kidney failure, unspecified: Secondary | ICD-10-CM

## 2014-10-14 DIAGNOSIS — L03031 Cellulitis of right toe: Secondary | ICD-10-CM

## 2014-10-14 DIAGNOSIS — I5043 Acute on chronic combined systolic (congestive) and diastolic (congestive) heart failure: Secondary | ICD-10-CM | POA: Diagnosis not present

## 2014-10-14 DIAGNOSIS — B029 Zoster without complications: Secondary | ICD-10-CM | POA: Diagnosis not present

## 2014-10-14 DIAGNOSIS — Z9181 History of falling: Secondary | ICD-10-CM | POA: Diagnosis not present

## 2014-10-14 DIAGNOSIS — D696 Thrombocytopenia, unspecified: Secondary | ICD-10-CM

## 2014-10-14 DIAGNOSIS — J9621 Acute and chronic respiratory failure with hypoxia: Secondary | ICD-10-CM

## 2014-10-14 DIAGNOSIS — L97529 Non-pressure chronic ulcer of other part of left foot with unspecified severity: Secondary | ICD-10-CM | POA: Diagnosis not present

## 2014-10-14 DIAGNOSIS — L97519 Non-pressure chronic ulcer of other part of right foot with unspecified severity: Secondary | ICD-10-CM

## 2014-10-14 DIAGNOSIS — I129 Hypertensive chronic kidney disease with stage 1 through stage 4 chronic kidney disease, or unspecified chronic kidney disease: Secondary | ICD-10-CM | POA: Diagnosis not present

## 2014-10-14 DIAGNOSIS — R945 Abnormal results of liver function studies: Secondary | ICD-10-CM

## 2014-10-14 DIAGNOSIS — G629 Polyneuropathy, unspecified: Secondary | ICD-10-CM | POA: Diagnosis not present

## 2014-10-14 DIAGNOSIS — L03039 Cellulitis of unspecified toe: Secondary | ICD-10-CM | POA: Insufficient documentation

## 2014-10-14 DIAGNOSIS — I10 Essential (primary) hypertension: Secondary | ICD-10-CM | POA: Diagnosis not present

## 2014-10-14 DIAGNOSIS — M6281 Muscle weakness (generalized): Secondary | ICD-10-CM | POA: Diagnosis not present

## 2014-10-14 DIAGNOSIS — I70244 Atherosclerosis of native arteries of left leg with ulceration of heel and midfoot: Secondary | ICD-10-CM | POA: Diagnosis not present

## 2014-10-14 DIAGNOSIS — R2681 Unsteadiness on feet: Secondary | ICD-10-CM | POA: Diagnosis not present

## 2014-10-14 DIAGNOSIS — I4891 Unspecified atrial fibrillation: Secondary | ICD-10-CM | POA: Diagnosis not present

## 2014-10-14 DIAGNOSIS — N183 Chronic kidney disease, stage 3 (moderate): Secondary | ICD-10-CM | POA: Diagnosis not present

## 2014-10-14 DIAGNOSIS — I70245 Atherosclerosis of native arteries of left leg with ulceration of other part of foot: Secondary | ICD-10-CM | POA: Diagnosis not present

## 2014-10-14 DIAGNOSIS — G5793 Unspecified mononeuropathy of bilateral lower limbs: Secondary | ICD-10-CM

## 2014-10-14 DIAGNOSIS — I70235 Atherosclerosis of native arteries of right leg with ulceration of other part of foot: Secondary | ICD-10-CM | POA: Diagnosis not present

## 2014-10-14 NOTE — Assessment & Plan Note (Signed)
Impending or early infection. Pt does not want to start abx-has had a bad time with C. Diff. Soak in warm water TID until resolved, good chance, or MD if progresses.

## 2014-10-14 NOTE — Progress Notes (Signed)
MRN: [id number] Name: Cheyenne Riggs  Sex: female Age: 69 y.o. DOB: July 18, 1945  [facility] #: [facility] Facility/Room:509 Level Of Care: SNF Provider: Inocencio Homes D Emergency Contacts: Extended Emergency Contact Information Primary Emergency Contact: [facility] Address: [address]          [place], [place] [address] Johnnette Litter of [place] Phone: [phone] Mobile Phone: [phone] Relation: Son Secondary Emergency Contact: Latanya Maudlin, [place] Phone: [phone] Mobile Phone: [phone] Relation: Friend  Code Status:FULL   Allergies: Ambien; Ciprofloxacin; Demerol; Lidocaine; Other; Oxycodone; Septra; Codeine; Novocain; and Vancomycin  Chief Complaint  Patient presents with  . Discharge Note    HPI: Patient is 69 y.o. female who was admitted to SNF after acutre on chronic respiratory failure and CHF, ARF, transaminitis 2/2 chemotherapy and thrombocytopenia who is now being d/c to home.  Past Medical History  Diagnosis Date  . Anxiety   . Back pain   . H/O bladder problems   . Cholelithiasis   . Depression   . High blood pressure   . Hypercholesteremia   . Kidney stones   . Gum disease   . Complication of anesthesia     bp goes up  . Wears glasses   . Wears dentures     top  . Kidney stones   . Cancer   . Breast cancer     Past Surgical History  Procedure Laterality Date  . Colon biopsy    . Mouth surgery    . Spine surgery    . Colonoscopy    . Tubal ligation    . Lithotripsy    . Rhinoplasty    . Back surgery  1989    lumb lam  . Breast biopsy Right 02/05/2014    invasive ductal cncer  . Abdominal hysterectomy  1982  . Cholecystectomy  2011    lap choli  . Radioactive seed guided mastectomy with axillary sentinel lymph node biopsy Right 02/27/2014    Procedure: RADIOACTIVE SEED GUIDED RIGHT PARTIAL MASTECTOMY WITH AXILLARY SENTINEL LYMPH NODE BIOPSY;  Surgeon: Stark Klein, MD;   Location: [facility];  Service: General;  Laterality: Right;  . Portacath placement N/A 02/27/2014    Procedure: INSERTION PORT-A-CATH;  Surgeon: Stark Klein, MD;  Location: [facility];  Service: General;  Laterality: N/A;  . Peripheral vascular catheterization N/A 09/28/2014    Procedure: Abdominal Aortogram;  Surgeon: Angelia Mould, MD;  Location: [facility];  Service: Cardiovascular;  Laterality: N/A;      Medication List       This list is accurate as of: 10/14/14  3:33 PM.  Always use your most recent med list.               apixaban 5 MG Tabs tablet  Commonly known as:  ELIQUIS  Take 1 tablet (5 mg total) by mouth 2 (two) times daily.     diphenhydrAMINE 25 MG tablet  Commonly known as:  BENADRYL  Take 25 mg by mouth daily as needed for allergies (allergies).     FLUoxetine 10 MG capsule  Commonly known as:  PROZAC  Take 1 capsule (10 mg total) by mouth daily.     furosemide 40 MG tablet  Commonly known as:  LASIX  Take 1 tablet (40 mg total) by mouth daily.     gabapentin 100 MG capsule  Commonly known as:  NEURONTIN  Take 1 capsule (100 mg total) by mouth 2 (two) times daily.     lidocaine-prilocaine cream  Commonly known as:  EMLA  Apply 1 application topically once a week. On Chemo Day (Monday).     LORazepam 0.5 MG tablet  Commonly known as:  ATIVAN  Take 1 tablet (0.5 mg total) by mouth every 8 (eight) hours as needed for anxiety (nausea). Nausea     metoprolol tartrate 25 MG tablet  Commonly known as:  LOPRESSOR  Take 1 tablet (25 mg total) by mouth 2 (two) times daily.     ondansetron 4 MG disintegrating tablet  Commonly known as:  ZOFRAN ODT  4mg  ODT q4 hours prn nausea/vomit     oxyCODONE-acetaminophen 5-325 MG per tablet  Commonly known as:  PERCOCET/ROXICET  Take by mouth every 4 (four) hours as needed for severe pain.     potassium chloride 10 MEQ CR capsule  Commonly known as:  MICRO-K  Take 1  capsule (10 mEq total) by mouth daily.     PROBIOTIC DAILY PO  Take 1 tablet by mouth daily.        No orders of the defined types were placed in this encounter.    Immunization History  Administered Date(s) Administered  . Influenza-Unspecified 02/12/2014    History  Substance Use Topics  . Smoking status: Former Smoker -- 0.00 packs/day    Quit date: 05/16/2014  . Smokeless tobacco: Not on file  . Alcohol Use: No    Filed Vitals:   10/14/14 1524  BP: 122/63  Pulse: 60  Temp: 98 F (36.7 C)  Resp: 18    Physical Exam  GENERAL APPEARANCE: Alert, conversant. No acute distress. SKIN- rash low back that starts at midline and progresses leftward; pink andswelling to R great toes with eschar on tip HEENT: Unremarkable. RESPIRATORY: Breathing is even, unlabored. Lung sounds are clear   CARDIOVASCULAR: Heart RRR no murmurs, rubs or gallops. 1+ peripheral edema.  GASTROINTESTINAL: Abdomen is soft, non-tender, not distended w/ normal bowel sounds.  NEUROLOGIC: Cranial nerves 2-12 grossly intact. Moves all extremities  Patient Active Problem List   Diagnosis Date Noted  . Shingles outbreak 10/14/2014  . Cellulitis of great toe 10/14/2014  . Metabolic acidosis 62/95/2841  . Acute on chronic respiratory failure 09/07/2014  . Acute on chronic combined systolic and diastolic heart failure 32/44/0102  . 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 Renal failure (ARF), acute on chronic   . Thrombocytopenia   . Palliative care encounter   . Neuropathic pain of both feet   . Elevated liver function tests   . Weakness   . Renal failure 08/26/2014  . Transaminitis 08/26/2014  . Acute renal failure syndrome   . Clostridium difficile colitis 08/24/2014  . Nausea without vomiting 08/24/2014  . Dehydration 08/24/2014  . Hypokalemia 08/24/2014  . Immobility 08/24/2014  . CKD (chronic kidney disease), stage III 08/18/2014  . Acute systolic heart failure   . Paroxysmal SVT  (supraventricular tachycardia) 08/17/2014  . UTI (lower urinary tract infection) 08/17/2014  . Protein-calorie malnutrition, severe 08/17/2014  . Elevated troponin 08/09/2014  . Antineoplastic chemotherapy induced anemia 06/29/2014  . Enteritis due to Clostridium difficile 05/24/2014  . Breast cancer of upper-outer quadrant of right female breast 02/18/2014  . Essential hypertension, benign 02/18/2014    CBC    Component Value Date/Time   WBC 4.4 09/28/2014 0756   WBC 4.9 08/13/2014 1012   RBC 4.00 09/28/2014 0756  RBC 4.34 08/13/2014 1012   HGB 11.5* 09/28/2014 0756   HGB 11.7 08/13/2014 1012   HCT 35.8* 09/28/2014 0756   HCT 37.6 08/13/2014 1012   PLT 185 09/28/2014 0756   PLT 222 08/13/2014 1012   MCV 89.5 09/28/2014 0756   MCV 86.7 08/13/2014 1012   LYMPHSABS 1.0 08/26/2014 1213   LYMPHSABS 0.6* 08/13/2014 1012   MONOABS 0.9 08/26/2014 1213   MONOABS 0.5 08/13/2014 1012   EOSABS 0.0 08/26/2014 1213   EOSABS 0.0 08/13/2014 1012   BASOSABS 0.0 08/26/2014 1213   BASOSABS 0.0 08/13/2014 1012    CMP     Component Value Date/Time   NA 144 09/28/2014 0736   NA 143 08/24/2014 1223   K 4.0 09/28/2014 0736   K 3.4* 08/24/2014 1223   CL 104 09/28/2014 0736   CO2 35* 09/03/2014 0511   CO2 24 08/24/2014 1223   GLUCOSE 79 09/28/2014 0736   GLUCOSE 114 08/24/2014 1223   BUN 8 09/28/2014 0736   BUN 8.9 08/24/2014 1223   CREATININE 0.90 09/28/2014 0736   CREATININE 1.2* 08/24/2014 1223   CALCIUM 8.6 09/03/2014 0511   CALCIUM 8.9 08/24/2014 1223   PROT 4.8* 09/03/2014 0511   PROT 5.8* 08/13/2014 1012   ALBUMIN 2.4* 09/03/2014 0511   ALBUMIN 3.1* 08/13/2014 1012   AST 77* 09/03/2014 0511   AST 18 08/13/2014 1012   ALT 47* 09/03/2014 0511   ALT 6 08/13/2014 1012   ALKPHOS 183* 09/03/2014 0511   ALKPHOS 70 08/13/2014 1012   BILITOT 1.8* 09/03/2014 0511   BILITOT 0.72 08/13/2014 1012   GFRNONAA 57* 09/28/2014 0714   GFRAA >60 09/28/2014 0714    Assessment and  Plan  Pt had eventful SNF stay. Her LFT's improved to normal, renal function improved to baseline and PLT's stablized then trended back to normal; Pt had apparent emboli to B toes, was tx with coumadin in SNF, seen by vascular as outpt and work-up started after which coumadin was changed to Eliquis 5 mg BID per cards. Pt has f/u with vascular and oncology pending. Today pt had 2 concerns outlined below but is stable for discharge with HH/OT/PT/nursing.  Shingles outbreak Itching onset yesterday. Pt has had shingles vaccine so outbreak will not be typical. Valtrex 100mg  TID for 7 days   Cellulitis of great toe Impending or early infection. Pt does not want to start abx-has had a bad time with C. Diff. Soak in warm water TID until resolved, good chance, or MD if progresses.     Hennie Duos, MD

## 2014-10-14 NOTE — Assessment & Plan Note (Signed)
Itching onset yesterday. Pt has had shingles vaccine so outbreak will not be typical. Valtrex 100mg  TID for 7 days

## 2014-10-16 ENCOUNTER — Telehealth: Payer: Self-pay | Admitting: Cardiovascular Disease

## 2014-10-16 ENCOUNTER — Telehealth: Payer: Self-pay | Admitting: Hematology and Oncology

## 2014-10-16 ENCOUNTER — Telehealth: Payer: Self-pay | Admitting: *Deleted

## 2014-10-16 ENCOUNTER — Other Ambulatory Visit: Payer: Self-pay | Admitting: *Deleted

## 2014-10-16 ENCOUNTER — Other Ambulatory Visit: Payer: Self-pay | Admitting: Hematology and Oncology

## 2014-10-16 DIAGNOSIS — C50411 Malignant neoplasm of upper-outer quadrant of right female breast: Secondary | ICD-10-CM

## 2014-10-16 NOTE — Telephone Encounter (Signed)
Patient called today to report that she had been discharged for the nursing / rehab facility yesterday. She was not given any of her medications to take home and is very concerned. She has been switched from Coumadin to Eliquis per Dr. Acie Fredrickson and has not been able to go to the pharmacy to get Rx filled since she left NH. She is very anxious about being alone in her house; her sister, Arbie Cookey, is able to visit some days and she has [facility] nurse coming out tomorrow. She reports having bilateral swelling in her legs from the knees down to the toes. She has Lasix to take as per Dr. Acie Fredrickson instruction but doesn't want to take it with no one staying with her, in case she falls. She does not have any swelling in her hands , no chest pain, and has no unusual breathing problems at this time. I told her to contact Dr. Franchot Erichsen office regarding her CHF. She will wait until her sister comes at lunchtime to take her Lasix and will ask her sister to take her to pharmacy and get her medication refilled. I stressed to her the importance of taking her Eliquis. She voice understanding and agreement of this plan.

## 2014-10-16 NOTE — Telephone Encounter (Signed)
Left message to call back  

## 2014-10-16 NOTE — Patient Outreach (Signed)
[facility]) Care Management  10/16/2014  Cheyenne Riggs 06/12/1945 [id number]   Phone call from patient stating that she just discharged form [facility] skilled nursing facility and has been experiencing some swelling in her lgfs and feet.  Per patient, she lives alone and has no one to assist  her.  Patient states that her main support person is her son who lives in [place] but he is out of town at this time. Patient sister available but not consistent.  Patient requesting assistance to return back the [facility]..  This social worker encouraged patient to call her primary care doctor to discuss her concerns or to go to urgent care if needed.  Phone call placed to [employer], spoke with Lexine Baton, who stated that patient could return, however she would need an FL2 from the doctor with recommendation on patient's level of care. Patient understands that a FL2 is needed and states that she will call her Oncologist to request the forms as per patient "I have not seen my primary doctor in over a year".  Patient states that [place] will be out to her home on 10/17/14. And will be able to assist her further  This social worker will follow up with patient on 10/19/14 in regards to decisions made related to recommended level of care.   Sheralyn Boatman [facility] Care Management [phone]

## 2014-10-16 NOTE — Telephone Encounter (Signed)
REFILL FOR ATIVAN WAS CALLED TO PHARMACY. PT. WAS GIVEN HER APPOINTMENT WITH [facility].

## 2014-10-16 NOTE — Telephone Encounter (Signed)
Lab added per pof and a new calendar has been mailed to the patient

## 2014-10-16 NOTE — Telephone Encounter (Signed)
Pt c/o swelling: STAT is pt has developed SOB within 24 hours  1. How long have you been experiencing swelling? Since this morning  2. Where is the swelling located? Both feet  3.  Are you currently taking a "fluid pill"? Yes  4.  Are you currently SOB? No  5.  Have you traveled recently? No  Pt calling stating that she was recently d/c from [facility] and she lives alone and is having a really hard time and would like to go back there. Please call back and advise.

## 2014-10-17 DIAGNOSIS — R269 Unspecified abnormalities of gait and mobility: Secondary | ICD-10-CM | POA: Diagnosis not present

## 2014-10-17 DIAGNOSIS — I5043 Acute on chronic combined systolic (congestive) and diastolic (congestive) heart failure: Secondary | ICD-10-CM | POA: Diagnosis not present

## 2014-10-17 DIAGNOSIS — L89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, stage 2: Secondary | ICD-10-CM | POA: Diagnosis not present

## 2014-10-17 DIAGNOSIS — N183 Chronic kidney disease, stage 3 (moderate): Secondary | ICD-10-CM | POA: Diagnosis not present

## 2014-10-17 DIAGNOSIS — C50011 Malignant neoplasm of nipple and areola, right female breast: Secondary | ICD-10-CM | POA: Diagnosis not present

## 2014-10-17 DIAGNOSIS — E669 Obesity, unspecified: Secondary | ICD-10-CM | POA: Diagnosis not present

## 2014-10-17 DIAGNOSIS — Z9181 History of falling: Secondary | ICD-10-CM | POA: Diagnosis not present

## 2014-10-17 DIAGNOSIS — I129 Hypertensive chronic kidney disease with stage 1 through stage 4 chronic kidney disease, or unspecified chronic kidney disease: Secondary | ICD-10-CM | POA: Diagnosis not present

## 2014-10-17 DIAGNOSIS — L97523 Non-pressure chronic ulcer of other part of left foot with necrosis of muscle: Secondary | ICD-10-CM | POA: Diagnosis not present

## 2014-10-17 DIAGNOSIS — M6281 Muscle weakness (generalized): Secondary | ICD-10-CM | POA: Diagnosis not present

## 2014-10-19 ENCOUNTER — Other Ambulatory Visit: Payer: Self-pay | Admitting: *Deleted

## 2014-10-19 DIAGNOSIS — I10 Essential (primary) hypertension: Secondary | ICD-10-CM

## 2014-10-19 NOTE — Telephone Encounter (Signed)
Spoke with patient and advised that I have reviewed chart and saw that Social Worker from [facility] was working with patient to get placement back in [facility].  Patient states she is having difficulty walking and cannot cook for herself.  She states she had an evaluation today and the recommendation is going to be that she get PT twice weekly, nursing 3 x per week.  She states she feels much better about her situation.  She denies cardiac complaints.  I advised her to call back with questions or concerns.  She verbalized understanding and agreement.

## 2014-10-19 NOTE — Patient Outreach (Signed)
[facility]) Care Management  10/19/2014  Cheyenne Riggs 1946/01/12 [id number]   Phone call to patient to follow up on patient's decision to return to the skilled nursing facility.  Per patient, she has decided not to return to the skilled nursing facility, stating that she is feeling much better.  The swelling has improved in her legs and feet.  She has an appointment on 10/27/14 to see Dr. Hedy Camara.  CSW scheduled home visit for 10/28/14 to assess for continued social work needs.   Cheyenne Riggs [facility] Care Management [phone]

## 2014-10-20 ENCOUNTER — Telehealth: Payer: Self-pay | Admitting: Cardiovascular Disease

## 2014-10-20 ENCOUNTER — Telehealth: Payer: Self-pay

## 2014-10-20 NOTE — Telephone Encounter (Signed)
Sent request for prior auth of Eliquis to [facility].

## 2014-10-20 NOTE — Addendum Note (Signed)
Addended by: Vern Claude on: 10/20/2014 03:23 PM   Modules accepted: Orders

## 2014-10-20 NOTE — Telephone Encounter (Signed)
New Messages     Pt needs prior auth for Eliquis.

## 2014-10-20 NOTE — Telephone Encounter (Signed)
Got it approved.

## 2014-10-20 NOTE — Patient Outreach (Signed)
[facility]) Care Management  10/20/2014  FELICE HOPE 19-May-1945 [id number]   Request from Elliot Gurney, [place] for Community RN, assigned Thea Silversmith, RN.  Ronnell Freshwater. [place], [place] Management [place] Assistant Phone: [phone] Fax: [phone]

## 2014-10-20 NOTE — Telephone Encounter (Signed)
Prior auth for Eliquis sent to [facility] via Cover My Meds.

## 2014-10-21 ENCOUNTER — Other Ambulatory Visit: Payer: Self-pay

## 2014-10-21 ENCOUNTER — Telehealth: Payer: Self-pay

## 2014-10-21 NOTE — Telephone Encounter (Signed)
Prior auth obtained from [facility] for Eliquis 5 mg tabs through 10/20/2015. Approval ID# [id number]. Pharmacy notified.

## 2014-10-21 NOTE — Patient Outreach (Addendum)
[facility]) Care Management  10/21/2014  JADASIA HAWS Dec 30, 1945 [id number]   Transition of care call completed. Member reports she is active with [facility] health agency. Member denies any problems at this time. Member discharged from Skilled facility on June 2nd. Member states she was hospitalized for dehydration, developed leg edema/heart failure in the hospital. Member reports history of breast cancer. Has been taking chemotherapy. Member reports Dr. Lindi Adie oncology as primary care. Prior to that was seeing Dr. Hulan Fess. Dr. Acie Fredrickson is her cardiologist. Member reports she has follow up appointment scheduled with oncologist and cardiologist.   Heart failure-Member reports she does have scales and weighs self daily. Member reports lasix is ordered as an as needed dose if her weight goes above 5 pounds.  Plan: Follow in Transition of care program. Co-visit at [facility] home with [facility] social worker-scheduled for June 13.  Thea Silversmith, RN, MSN, [place] Coordinator Cell: [phone]

## 2014-10-22 ENCOUNTER — Telehealth: Payer: Self-pay | Admitting: *Deleted

## 2014-10-22 NOTE — Telephone Encounter (Signed)
Patient requests an rx for potassium. She stated that she is taking 10 meq bid and verified that this is what is on her bottle. Her last ov has that she is taking 10 meq qd. Please advise. Thanks, MI

## 2014-10-23 ENCOUNTER — Other Ambulatory Visit: Payer: Self-pay | Admitting: *Deleted

## 2014-10-23 DIAGNOSIS — C50411 Malignant neoplasm of upper-outer quadrant of right female breast: Secondary | ICD-10-CM

## 2014-10-23 MED ORDER — POTASSIUM CHLORIDE ER 10 MEQ PO CPCR: 10.0000 meq | ORAL_CAPSULE | Freq: Every day | ORAL | Status: DC

## 2014-10-23 NOTE — Telephone Encounter (Signed)
Pt.s stated she was taking potassium as directed, med refilled as requested by Dr. Shelbie Ammons

## 2014-10-23 NOTE — Telephone Encounter (Signed)
Her potassium level 2 weeks ago was perfect. If she is taking kdur 10 bid when that lab was drawn, then we should write her for 10 bid

## 2014-10-23 NOTE — Telephone Encounter (Signed)
A representative from [employer] called and needs clarification of patients potassium. They need a new order for this called to Sherri at [phone] or faxed to [phone]. Thanks, MI

## 2014-10-23 NOTE — Telephone Encounter (Signed)
Waiting for Dr. Marlou Porch response from [facility] on pt.s potassium refill

## 2014-10-26 ENCOUNTER — Ambulatory Visit: Payer: Medicare Other | Admitting: *Deleted

## 2014-10-26 ENCOUNTER — Ambulatory Visit: Payer: Self-pay | Admitting: *Deleted

## 2014-10-26 ENCOUNTER — Encounter: Payer: Self-pay | Admitting: *Deleted

## 2014-10-26 NOTE — Patient Outreach (Signed)
[facility]) Care Management  10/26/2014  KEIRSTEN MATUSKA [birth year] [id number]   Initial home visit scheduled for today at 2pm.  CSW and RNCM arrived, however there was no answer at the door.  Telephone call attempted by [facility], no answer.  CSW to follow up with patient by phone to re-schedule home visit.    Sheralyn Boatman [facility] Care Management [phone]

## 2014-10-27 ENCOUNTER — Ambulatory Visit: Payer: Medicare Other | Admitting: Pharmacist

## 2014-10-27 ENCOUNTER — Ambulatory Visit (INDEPENDENT_AMBULATORY_CARE_PROVIDER_SITE_OTHER): Payer: Medicare Other | Admitting: *Deleted

## 2014-10-27 ENCOUNTER — Telehealth: Payer: Self-pay | Admitting: Nurse Practitioner

## 2014-10-27 DIAGNOSIS — C50411 Malignant neoplasm of upper-outer quadrant of right female breast: Secondary | ICD-10-CM

## 2014-10-27 DIAGNOSIS — I4891 Unspecified atrial fibrillation: Secondary | ICD-10-CM

## 2014-10-27 LAB — BASIC METABOLIC PANEL
BUN: 10 mg/dL (ref 6–23)
CO2: 29 meq/L (ref 19–32)
CREATININE: 0.9 mg/dL (ref 0.40–1.20)
Calcium: 9.4 mg/dL (ref 8.4–10.5)
Chloride: 107 mEq/L (ref 96–112)
GFR: 66.05 mL/min (ref >60.00)
GLUCOSE: 80 mg/dL (ref 70–99)
Potassium: 3.8 mEq/L (ref 3.5–5.1)
SODIUM: 139 meq/L (ref 135–145)

## 2014-10-27 LAB — CBC
HCT: 36.5 % (ref 36.0–46.0)
Hemoglobin: 11.8 g/dL — ABNORMAL LOW (ref 12.0–15.0)
MCHC: 32.3 g/dL (ref 30.0–36.0)
MCV: 90.3 fl (ref 78.0–100.0)
Platelets: 177 10*3/uL (ref 150.0–400.0)
RBC: 4.04 Mil/uL (ref 3.87–5.11)
RDW: 20.8 % — ABNORMAL HIGH (ref 11.5–15.5)
WBC: 4.3 10*3/uL (ref 4.0–10.5)

## 2014-10-27 MED ORDER — POTASSIUM CHLORIDE ER 10 MEQ PO CPCR: 10.0000 meq | ORAL_CAPSULE | Freq: Two times a day (BID) | ORAL | Status: DC

## 2014-10-27 NOTE — Telephone Encounter (Signed)
Received message from 6/9 regarding patient's refill of potassium supplement - per Dr. Acie Fredrickson to continue 10 meq BID.  Patient's refill was sent stating take 10 meq once daily.  I called and advised patient and she verbalized understanding and agreement.

## 2014-10-27 NOTE — Progress Notes (Signed)
Pt was started on Eliquis 5mg  bid q 12 hours for Atrial Fib on or around May 12,2016.    Reviewed patients medication list.  Pt is not currently on any combined P-gp and strong CYP3A4 inhibitors/inducers (ketoconazole, traconazole, ritonavir, carbamazepine, phenytoin, rifampin, St. John's wort).  Reviewed labs.  SCr 0.9 , Weight 75.36kg , .  Dose is  appropriate  based on SrCr Weight and Age   Hgb 11.8 and HCT 36.5  A full discussion of the nature of anticoagulants has been carried out.  A benefit/risk analysis has been presented to the patient, so that they understand the justification for choosing anticoagulation with Eliquis at this time.  The need for compliance is stressed.  Pt is aware to take the medication twice daily.  Side effects of potential bleeding are discussed, including unusual colored urine or stools, coughing up blood or coffee ground emesis, nose bleeds or serious fall or head trauma.  Discussed signs and symptoms of stroke. The patient should avoid any OTC items containing aspirin or ibuprofen.  Avoid alcohol consumption.   Call if any signs of abnormal bleeding.  Discussed financial obligations and has obtained approval through [place] through 2017 for Eliquis.  Next lab test test in 6 months. Was in [place] in April and then went to [employer] from May 1st to [place] and now home . Had abd aortogram on 09/28/2014 and did have shingles at [facility] placed on Valtrex for 7 days. States has not missed any doses of Eliquis  and has had no sign or symptom of bleeding or stroke and is tolerating taking Eliquis Will do CBC and Bmet today and will call with results Spoke with pt and informed that she is on the correct dose of Eliquis and made an appt for her to be seen in 6 months

## 2014-10-28 DIAGNOSIS — E1121 Type 2 diabetes mellitus with diabetic nephropathy: Secondary | ICD-10-CM | POA: Diagnosis not present

## 2014-10-28 DIAGNOSIS — D696 Thrombocytopenia, unspecified: Secondary | ICD-10-CM | POA: Diagnosis not present

## 2014-10-28 DIAGNOSIS — I10 Essential (primary) hypertension: Secondary | ICD-10-CM | POA: Diagnosis not present

## 2014-10-28 DIAGNOSIS — I509 Heart failure, unspecified: Secondary | ICD-10-CM | POA: Diagnosis not present

## 2014-10-28 DIAGNOSIS — I213 ST elevation (STEMI) myocardial infarction of unspecified site: Secondary | ICD-10-CM | POA: Diagnosis not present

## 2014-10-28 DIAGNOSIS — C50919 Malignant neoplasm of unspecified site of unspecified female breast: Secondary | ICD-10-CM | POA: Diagnosis not present

## 2014-10-28 DIAGNOSIS — N170 Acute kidney failure with tubular necrosis: Secondary | ICD-10-CM | POA: Diagnosis not present

## 2014-10-28 DIAGNOSIS — R740 Nonspecific elevation of levels of transaminase and lactic acid dehydrogenase [LDH]: Secondary | ICD-10-CM | POA: Diagnosis not present

## 2014-10-29 ENCOUNTER — Other Ambulatory Visit: Payer: Self-pay | Admitting: *Deleted

## 2014-10-29 ENCOUNTER — Encounter: Payer: Self-pay | Admitting: Hematology and Oncology

## 2014-10-29 ENCOUNTER — Other Ambulatory Visit: Payer: Self-pay

## 2014-10-29 ENCOUNTER — Telehealth: Payer: Self-pay | Admitting: Cardiovascular Disease

## 2014-10-29 NOTE — Telephone Encounter (Signed)
Pt. states she had taken Crestor for a long time, but she had to stop taking it, when she had  chemotherapy treatment. Chemo ended 3 weeks ago. Pt would like to know if she can restart the medication. Pt is aware that we will let Dr. Acie Fredrickson and his nurse know for recommendations. Pt states will call tomorrow to talk with MD's nurse.

## 2014-10-29 NOTE — Patient Outreach (Signed)
[facility]) Care Management  10/29/14-Late entry for 10/26/14  Cheyenne Riggs 1946/04/16 [id number]   Received voice message from member stating she thought she had a doctor's appointment at 1:30 at her doctor's office to find out that she did not have an appointment. Member apologized for missing her care management appointment and request call back to reschedule.  Thea Silversmith, RN, MSN, [place] Coordinator Cell: [phone]

## 2014-10-29 NOTE — Patient Outreach (Signed)
[place] [facility] Care Management  10/29/2014  Cheyenne Riggs [birth year] [id number]  Phone call to patient to re-schedule home visit with this social worker and RNCM.  Home visit scheduled for 11/04/14 at 1:30pm.    [place], [place] Management [phone]

## 2014-10-29 NOTE — Telephone Encounter (Signed)
New message      Pt has a question about crestor.  She was on crestor for a long time but stopped when she had chemo.  Chemo ended 3wk ago.  Should she restat crestor? Patient said her PCP told her she had had a heart attack but Dr Acie Fredrickson never said that.  Is there any indication in our records that pt has had a heart attack?

## 2014-10-30 ENCOUNTER — Telehealth: Payer: Self-pay

## 2014-10-30 NOTE — Telephone Encounter (Signed)
Spoke with patient who states she would like to restart Crestor - can't remember why she stopped it.  I advised that I was able to speak with Dr. Acie Fredrickson by telephone and he advised that if oncologist is in agreement, that she can restart at previous dosage (20 mg daily).  She states her PCP, Dr. Rex Kras said something to her about a previous MI; she states she has never been made aware of this.  I advised that I do not see documentation of an MI on her chart but will continue to review chart and call her back next week.  Patient verbalized understanding and agreement.

## 2014-10-30 NOTE — Telephone Encounter (Signed)
Request sent to medical records

## 2014-10-31 NOTE — Telephone Encounter (Signed)
aGREE with note from Christen Bame, RN

## 2014-11-02 ENCOUNTER — Telehealth: Payer: Self-pay | Admitting: Hematology and Oncology

## 2014-11-02 NOTE — Telephone Encounter (Signed)
Faxed pt medical records to  Dr. Hulan Fess [phone]

## 2014-11-03 NOTE — Telephone Encounter (Signed)
Left message on patient's personal voice mail regarding the lack of documentation of previous MI in patient's chart.  I advised her in the message that per Dr. Acie Fredrickson, sometimes hospital admissions can say "r/o MI" and Dr. Rex Kras may have seen something with this documented on it.  I advised her to call back with questions or concerns.

## 2014-11-04 ENCOUNTER — Encounter: Payer: Self-pay | Admitting: *Deleted

## 2014-11-04 ENCOUNTER — Other Ambulatory Visit: Payer: Self-pay | Admitting: *Deleted

## 2014-11-04 NOTE — Patient Outreach (Signed)
[facility]) Care Management  [facility] Social Work  11/04/2014  Cheyenne Riggs October 16, 1945 [id number]  Subjective:    Objective:   Current Medications:  Current Outpatient Prescriptions  Medication Sig Dispense Refill  . apixaban (ELIQUIS) 5 MG TABS tablet Take 1 tablet (5 mg total) by mouth 2 (two) times daily. 60 tablet 11  . diphenhydrAMINE (BENADRYL) 25 MG tablet Take 25 mg by mouth daily as needed for allergies (allergies).     Marland Kitchen FLUoxetine (PROZAC) 10 MG capsule Take 1 capsule (10 mg total) by mouth daily. 30 capsule 1  . furosemide (LASIX) 40 MG tablet Take 1 tablet (40 mg total) by mouth daily. 30 tablet 0  . gabapentin (NEURONTIN) 100 MG capsule Take 1 capsule (100 mg total) by mouth 2 (two) times daily.    Marland Kitchen LORazepam (ATIVAN) 0.5 MG tablet TAKE 1 TABLET BY MOUTH EVERY 8 HOURS AS NEEDED NAUSEA 30 tablet 0  . metoprolol tartrate (LOPRESSOR) 25 MG tablet Take 1 tablet (25 mg total) by mouth 2 (two) times daily. 60 tablet 3  . oxyCODONE-acetaminophen (PERCOCET/ROXICET) 5-325 MG per tablet Take by mouth every 4 (four) hours as needed for severe pain.    . potassium chloride (MICRO-K) 10 MEQ CR capsule Take 1 capsule (10 mEq total) by mouth 2 (two) times daily. 180 capsule 3  . Probiotic Product (PROBIOTIC DAILY PO) Take 1 tablet by mouth daily.      No current facility-administered medications for this visit.    Functional Status:  In your present state of health, do you have any difficulty performing the following activities: 09/28/2014 08/26/2014  Hearing? N N  Vision? N N  Difficulty concentrating or making decisions? N N  Walking or climbing stairs? Y Y  Dressing or bathing? Y N  Doing errands, shopping? - Y    Fall/Depression Screening:  No flowsheet data found.  Assessment:  Initial home visit to patient's home to assess for social work need.  Patient resides in alone in her own home. Currently has [facility] Health [facility].   Patient has a 2 story  home, however has made a living space out of her downstairs area to avoid the stairs. Patient  uses a cain and walker.   Patient describes having support through a family friend, Arbie Cookey who will often accompany her to her medical appointments.  Patient also has a son, [name] (health care power of attorney)  who lives in [place].  Patient also discussed having a supportive church family (instrumental in building rails on back porch stairs). Patient has completed her advanced directive, needs to give  Her doctor and Hospital a copy.     Per patient, she takes care of her own ADL's and IADL's.  Has transportation to MD appointments most times, however would like information for SCAT transportation.   Patient has an appointment  with her Oncologist next week to discuss continuing chemotherapy.  Will attend appointment  alone and will then inform family and friends of decision made. Has not reached out to support there has not needed it. Per patient she has a history of depression. Patient  takes  prozac now prescribed  through primary care doctor which she describes as being helpful.  Patient verbalizes no symptoms of  depression, or  hopelessness, per patient her faith has helped a lot.  Plan:  Part B of SCAT application to be completed during next home visited.     [facility] CM Care Plan Problem One  Patient Outreach Telephone from 10/21/2014 in [place] Problem One  at risk for readmission   Care Plan for Problem One  Active   [facility] Long Term Goal (31-90 days)  Member will not be readmitted within the next 31 days.   [facility] Long Term Goal Start Date  10/21/14   Interventions for Problem One Long Term Goal  transtion of care call completed, discussed medications, discussed importance of attending follow up appointments, discussed following care planfor home health, care manager contact number provided.   [facility] CM Short Term Goal #1 (0-30 days)  Member to continue to weigh self  daily within the next week.   [facility] CM Short Term Goal #1 Start Date  10/21/14   Interventions for Short Term Goal #1  discussed importance of daily weights in relation to diuretics.   [facility] CM Short Term Goal #2 (0-30 days)  member will attend scheduled follow up appointments within the next 30 days.   [facility] CM Short Term Goal #2 Start Date  10/21/14   Interventions for Short Term Goal #2  discussed follow up appointments.    [place] Plan Problem Two        Patient Outreach from 11/04/2014 in [place] Problem Two  transportatiom   Care Plan for Problem Two  Active   [facility] CM Short Term Goal #1 (0-30 days)  patient will complete SCAT application within 30 days   [facility] CM Short Term Goal #1 Start Date  11/04/14   Interventions for Short Term Goal #2   patient encouraged to complete part a of SCAT application by next home visit      [place], [place] Management [phone]

## 2014-11-05 NOTE — Patient Outreach (Addendum)
[facility]) Care Management  [place]  11/05/2014   Cheyenne Riggs [birth year] [id number]  Subjective: Patient states she is doing good and much better. Reports she has seen primary care provider (Dr. Lawernce Riggs) recently. She states has an appointment with Dr. Lindi Riggs (oncologist) on 6/28. Patient claims hurting to left foot and has an appointment with wound care center at [facility] for wound debridement this Friday and will see Dr. Doren Riggs (Cardiovascular surgeon) next week.  Objective: BP 120/72 mmHg  Pulse 61  Resp 18  SpO2 97% Respiration: even and unlabored Lungs: clear to auscultation Heart: regular  Bowel sounds: normal Skin: top of left great toe with eschar approximately 1/2 to 1 inch in diameter. Covered with dressing per member. Left heel scabbed area about 1/2 inch long, covered with dressing by member.   Current Medications:  Current Outpatient Prescriptions  Medication Sig Dispense Refill  . apixaban (ELIQUIS) 5 MG TABS tablet Take 1 tablet (5 mg total) by mouth 2 (two) times daily. 60 tablet 11  . diphenhydrAMINE (BENADRYL) 25 MG tablet Take 25 mg by mouth daily as needed for allergies (allergies).     Marland Kitchen FLUoxetine (PROZAC) 10 MG capsule Take 1 capsule (10 mg total) by mouth daily. 30 capsule 1  . furosemide (LASIX) 40 MG tablet Take 1 tablet (40 mg total) by mouth daily. 30 tablet 0  . gabapentin (NEURONTIN) 100 MG capsule Take 1 capsule (100 mg total) by mouth 2 (two) times daily.    Marland Kitchen LORazepam (ATIVAN) 0.5 MG tablet TAKE 1 TABLET BY MOUTH EVERY 8 HOURS AS NEEDED NAUSEA 30 tablet 0  . metoprolol tartrate (LOPRESSOR) 25 MG tablet Take 1 tablet (25 mg total) by mouth 2 (two) times daily. 60 tablet 3  . potassium chloride (MICRO-K) 10 MEQ CR capsule Take 1 capsule (10 mEq total) by mouth 2 (two) times daily. 180 capsule 3  . oxyCODONE-acetaminophen (PERCOCET/ROXICET) 5-325 MG per tablet Take by mouth every 4 (four) hours as needed for severe pain.     . Probiotic Product (PROBIOTIC DAILY PO) Take 1 tablet by mouth daily.      No current facility-administered medications for this visit.    Functional Status:  In your present state of health, do you have any difficulty performing the following activities: 09/28/2014 08/26/2014  Hearing? N N  Vision? N N  Difficulty concentrating or making decisions? N N  Walking or climbing stairs? Y Y  Dressing or bathing? Y N  Doing errands, shopping? - Y    Fall/Depression Screening: PHQ 2/9 Scores 11/04/2014  PHQ - 2 Score 0    Assessment: Initial home visit to assess care management needs completed. 69 year old female with discharge diagnosis of dehydration, per discharge note, developed leg edema and heart failure. She was recently discharged from [facility]. Patient verbalized elevating legs using device obtained from doctor's office.    Patient lives alone. Has a son Cheyenne Riggs) who lives in [place] who is supportive with care but has a friend [name]) that she depends on at times (although availability is not consistent per patient). She mentioned that she relies on her yard man for transportation at the moment. [name] Social worker (Cheyenne L.) co-visiting with care Nurse, children's noted patient's transportation issue.  Patient states has walker to use with ambulation, but reports she had been trying to walk on her own for some steps as encouraged by physical therapy.  She reports being active with [place] [facility]  health services.  Reviewed medications with patient and she reports managing her medications without a problem.  Patient states feeling better but her only issue is the intermittent pain to her lower legs which patient states that she was informed by her doctor that is related to lack of circulation.  Per patient report, her pain is relieved by moving or ambulating around. States she has an appointment with Dr. Doren Riggs (Cardiovascular surgeon) on 6/29 to  follow up with her lower extremity wounds. She also reports will see Dr. Lindi Riggs (oncologist) for follow-up appointment on 6/28 to discuss about continuing chemotherapy related to history of breast cancer.   Plan: Transition of care call on November 10, 2014   [facility] CM Care Plan Problem One        Most Recent Value   Care Plan Problem One  at risk for readmission   Role Documenting the Problem One  Care Management [place] for Problem One  Active   [facility] Long Term Goal (31-90 days)  Member will not be readmitted within the next 31 days.   [facility] Long Term Goal Start Date  10/21/14   Interventions for Problem One Long Term Goal  home visit completed, reviewed medications, emphasized importance of attending follow up appointments, provided care manager contact number and 24 hour nurse line, provided [facility] calendar to record weight readings.   [facility] CM Short Term Goal #1 (0-30 days)  Member to continue to weigh self daily within the next week.   [facility] CM Short Term Goal #1 Start Date  10/21/14   Interventions for Short Term Goal #1  reinforced importance of daily weights in relation to diuretics, provided [facility] calendar to document weights    [facility] CM Short Term Goal #2 (0-30 days)  member will attend scheduled follow up appointments within the next 30 days.   [facility] CM Short Term Goal #2 Start Date  10/21/14   Interventions for Short Term Goal #2  reviewed follow up appointments,  discussed benefits of attending doctors appointments       [facility] CM Care Plan Problem Three        Most Recent Value   Care Plan Problem Three  knowledge deficit regarding heart failure   Role Documenting the Problem Three  Care Management Coordinator   Care Plan for Problem Three  Active   [facility] CM Short Term Goal #1 (0-30 days)  patient will identify the different heart failure zones and name at least 3 components of each zone in the next 30 days   [facility] CM Short Term Goal #1 Start Date  11/04/14   Interventions for Short Term  Goal #1  provided heart failure packet to patient,  discussed the different heart failure zones,          Nirvan Laban A. Bernice Mullin, BSN, RN-BC [place] Coordinator Cell: [phone]

## 2014-11-06 ENCOUNTER — Encounter: Payer: Self-pay | Admitting: Vascular Surgery

## 2014-11-06 ENCOUNTER — Encounter: Payer: Self-pay | Admitting: *Deleted

## 2014-11-06 ENCOUNTER — Encounter: Payer: Medicare Other | Attending: Surgery | Admitting: Surgery

## 2014-11-06 DIAGNOSIS — I129 Hypertensive chronic kidney disease with stage 1 through stage 4 chronic kidney disease, or unspecified chronic kidney disease: Secondary | ICD-10-CM | POA: Insufficient documentation

## 2014-11-06 DIAGNOSIS — Z853 Personal history of malignant neoplasm of breast: Secondary | ICD-10-CM | POA: Insufficient documentation

## 2014-11-06 DIAGNOSIS — E11621 Type 2 diabetes mellitus with foot ulcer: Secondary | ICD-10-CM | POA: Diagnosis not present

## 2014-11-06 DIAGNOSIS — Z9221 Personal history of antineoplastic chemotherapy: Secondary | ICD-10-CM | POA: Diagnosis not present

## 2014-11-06 DIAGNOSIS — L89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, stage 2: Secondary | ICD-10-CM | POA: Insufficient documentation

## 2014-11-06 DIAGNOSIS — Z992 Dependence on renal dialysis: Secondary | ICD-10-CM | POA: Insufficient documentation

## 2014-11-06 DIAGNOSIS — L97521 Non-pressure chronic ulcer of other part of left foot limited to breakdown of skin: Secondary | ICD-10-CM | POA: Diagnosis not present

## 2014-11-06 DIAGNOSIS — L97522 Non-pressure chronic ulcer of other part of left foot with fat layer exposed: Secondary | ICD-10-CM | POA: Diagnosis not present

## 2014-11-06 DIAGNOSIS — Z87891 Personal history of nicotine dependence: Secondary | ICD-10-CM | POA: Diagnosis not present

## 2014-11-06 NOTE — Progress Notes (Signed)
DERRY, ARBOGAST ([id number]) Visit Report for 11/06/2014 Allergy List Details Patient Name: Cheyenne Riggs, Cheyenne Riggs. Date of Service: 11/06/2014 11:00 AM Medical Record Number: [id number] Patient Account Number: [id number] Date of Birth/Sex: 10/09/1945 (68 y.o. Female) Treating RN: Montey Hora Primary Care Physician: Nicholas Lose Other Clinician: Referring Physician: Hulan Fess Treating Physician/Extender: Frann Rider in Treatment: 0 Allergies Active Allergies Demerol novacaine codeine Cipro Lipitor Vytorin lovastatin Septra NSAIDS (Non-Steroidal Anti-Inflammatory Drug) Crestor Reaction: doses higher than 40mg  Allergy Notes Electronic Signature(s) Signed: 11/06/2014 3:37:55 PM By: Montey Hora Entered By: Montey Hora on 11/06/2014 11:16:14 Cheyenne Riggs ([id number]) -------------------------------------------------------------------------------- Arrival Information Details Patient Name: Cheyenne Riggs. Date of Service: 11/06/2014 11:00 AM Medical Record Number: [id number] Patient Account Number: [id number] Date of Birth/Sex: [birth year] (68 y.o. Female) Treating RN: Montey Hora Primary Care Physician: Nicholas Lose Other Clinician: Referring Physician: Hulan Fess Treating Physician/Extender: Frann Rider in Treatment: 0 Visit Information Patient Arrived: Charlyn Minerva Time: 11:10 Accompanied By: self Transfer Assistance: None Patient Identification Verified: Yes Secondary Verification Process Yes Completed: Patient Has Alerts: Yes Patient Alerts: Patient on Blood Thinner DMII eliquis Electronic Signature(s) Signed: 11/06/2014 3:37:55 PM By: Montey Hora Entered By: Montey Hora on 11/06/2014 11:13:59 Cheyenne Riggs ([id number]) -------------------------------------------------------------------------------- Clinic Level of Care Assessment Details Patient Name: Cheyenne Riggs. Date of Service: 11/06/2014 11:00 AM Medical Record Number:  [id number] Patient Account Number: [id number] Date of Birth/Sex: 12-14-1945 (68 y.o. Female) Treating RN: Montey Hora Primary Care Physician: Nicholas Lose Other Clinician: Referring Physician: Hulan Fess Treating Physician/Extender: Frann Rider in Treatment: 0 Clinic Level of Care Assessment Items TOOL 1 Quantity Score []  - Use when EandM and Procedure is performed on INITIAL visit 0 ASSESSMENTS - Nursing Assessment / Reassessment X - General Physical Exam (combine w/ comprehensive assessment (listed just 1 20 below) when performed on new pt. evals) X - Comprehensive Assessment (HX, ROS, Risk Assessments, Wounds Hx, etc.) 1 25 ASSESSMENTS - Wound and Skin Assessment / Reassessment []  - Dermatologic / Skin Assessment (not related to wound area) 0 ASSESSMENTS - Ostomy and/or Continence Assessment and Care []  - Incontinence Assessment and Management 0 []  - Ostomy Care Assessment and Management (repouching, etc.) 0 PROCESS - Coordination of Care X - Simple Patient / Family Education for ongoing care 1 15 []  - Complex (extensive) Patient / Family Education for ongoing care 0 []  - Staff obtains Programmer, systems, Records, Test Results / Process Orders 0 []  - Staff telephones HHA, Nursing Homes / Clarify orders / etc 0 []  - Routine Transfer to another Facility (non-emergent condition) 0 []  - Routine Hospital Admission (non-emergent condition) 0 X - New Admissions / Biomedical engineer / Ordering NPWT, Apligraf, etc. 1 15 []  - Emergency Hospital Admission (emergent condition) 0 PROCESS - Special Needs []  - Pediatric / Minor Patient Management 0 []  - Isolation Patient Management 0 SHA, BURLING. ([id number]) []  - Hearing / Language / Visual special needs 0 []  - Assessment of Community assistance (transportation, D/C planning, etc.) 0 []  - Additional assistance / Altered mentation 0 []  - Support Surface(s) Assessment (bed, cushion, seat, etc.) 0 INTERVENTIONS - Miscellaneous []   - External ear exam 0 []  - Patient Transfer (multiple staff / Civil Service fast streamer / Similar devices) 0 []  - Simple Staple / Suture removal (25 or less) 0 []  - Complex Staple / Suture removal (26 or more) 0 []  - Hypo/Hyperglycemic Management (do not check if billed separately) 0 X - Ankle / Brachial Index (ABI) - do not check if billed separately  1 15 Has the patient been seen at the hospital within the last three years: Yes Total Score: 90 Level Of Care: New/Established - Level 3 Electronic Signature(s) Signed: 11/06/2014 2:04:43 PM By: Montey Hora Entered By: Montey Hora on 11/06/2014 14:04:43 Cheyenne Riggs ([id number]) -------------------------------------------------------------------------------- Encounter Discharge Information Details Patient Name: Cheyenne Riggs. Date of Service: 11/06/2014 11:00 AM Medical Record Number: [id number] Patient Account Number: [id number] Date of Birth/Sex: 1946-01-11 (68 y.o. Female) Treating RN: Primary Care Physician: Nicholas Lose Other Clinician: Referring Physician: Hulan Fess Treating Physician/Extender: Frann Rider in Treatment: 0 Encounter Discharge Information Items Discharge Pain Level: 0 Discharge Condition: Stable Ambulatory Status: Cane Discharge Destination: Home Transportation: Private Auto Accompanied By: self Schedule Follow-up Appointment: Yes Medication Reconciliation completed No and provided to Patient/Care Sayde Lish: Provided on Clinical Summary of Care: 11/06/2014 Form Type Recipient Paper Patient Hyman Bible Electronic Signature(s) Signed: 11/06/2014 2:06:49 PM By: Montey Hora Previous Signature: 11/06/2014 12:21:13 PM Version By: Ruthine Dose Entered By: Montey Hora on 11/06/2014 14:06:49 Cheyenne Riggs ([id number]) -------------------------------------------------------------------------------- Lower Extremity Assessment Details Patient Name: Cheyenne Riggs. Date of Service: 11/06/2014 11:00 AM Medical Record  Number: [id number] Patient Account Number: [id number] Date of Birth/Sex: [birth year] (68 y.o. Female) Treating RN: Montey Hora Primary Care Physician: Nicholas Lose Other Clinician: Referring Physician: Hulan Fess Treating Physician/Extender: Frann Rider in Treatment: 0 Edema Assessment Assessed: [Left: No] [Right: No] E[Left: dema] [Right: :] Calf Left: Right: Point of Measurement: 31 cm From Medial Instep 34 cm 32 cm Ankle Left: Right: Point of Measurement: 10 cm From Medial Instep 21.7 cm 20.6 cm Vascular Assessment Pulses: Posterior Tibial Palpable: [Left:Yes] [Right:Yes] Doppler: [Left:Multiphasic] Dorsalis Pedis Palpable: [Left:Yes] [Right:Yes] Doppler: [Left:Multiphasic] [Right:Multiphasic] Extremity colors, hair growth, and conditions: Extremity Color: [Left:Normal] [Right:Normal] Hair Growth on Extremity: [Left:Yes] [Right:Yes] Temperature of Extremity: [Left:Warm] [Right:Warm] Capillary Refill: [Left:< 3 seconds] [Right:< 3 seconds] Blood Pressure: Brachial: [Left:146] Dorsalis Pedis: 168 [Left:Dorsalis Pedis: 417] Ankle: Posterior Tibial: 146 [Left:Posterior Tibial: 188 1.15] [Right:1.29] Toe Nail Assessment Left: Right: Thick: No No Discolored: No No Deformed: No No Improper Length and Hygiene: No No KIYANI, JERNIGAN ([id number]) Electronic Signature(s) Signed: 11/06/2014 3:37:55 PM By: Montey Hora Entered By: Montey Hora on 11/06/2014 11:50:12 Cheyenne Riggs ([id number]) -------------------------------------------------------------------------------- Multi Wound Chart Details Patient Name: Cheyenne Riggs. Date of Service: 11/06/2014 11:00 AM Medical Record Number: [id number] Patient Account Number: [id number] Date of Birth/Sex: [birth year] (68 y.o. 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] Comorbid History: [1:Arrhythmia, Congestive Arrhythmia, Congestive N/A Heart Failure, Hypertension, Peripheral Hypertension, Peripheral Venous Disease, Type II Venous Disease, Type II Diabetes, Neuropathy, Received Chemotherapy Received Chemotherapy]  [2:Heart Failure, Diabetes, Neuropathy,] Date Acquired: [1:08/18/2014] [2:08/18/2014] [N/A:N/A] Weeks of Treatment: [1:0] [2:0] [N/A:N/A] Wound Status: [1:Open] [2:Open] [N/A:N/A] Measurements L x W x D 0.7x0.2x0.1 [2:2.5x1.7x0.3] [N/A:N/A] (cm) Area (cm) : [1:0.11] [2:3.338] [N/A:N/A] Volume (cm) : [1:0.011] [2:1.001] [N/A:N/A] % Reduction in Area: [1:0.00%] [2:0.00%] [N/A:N/A] % Reduction in Volume: 0.00% [2:0.00%] [N/A:N/A] Classification: [1:Category/Stage II] [2:Unable to visualize wound N/A bed] HBO Classification: [1:Grade 1] [2:N/A] [N/A:N/A] Exudate Amount: [1:Small] [2:Small] [N/A:N/A] Exudate Type: [1:Serous] [2:Serous] [N/A:N/A]  Exudate Color: [1:amber] [2:amber] [N/A:N/A] Wound Margin: [1:Flat and Intact] [2:Flat and Intact] [N/A:N/A] Granulation Amount: [1:None Present (0%)] [2:None Present (0%)] [N/A:N/A] Necrotic Amount: [1:None Present (0%)] [2:Large (67-100%)] [N/A:N/A] Necrotic Tissue: [1:N/A] [2:Eschar] [N/A:N/A] Exposed Structures: Fascia: No Fascia: No N/A Fat: No Fat: No Tendon: No Tendon: No Muscle: No Muscle: No Joint: No Joint: No Bone: No Bone: No Limited to Skin Limited to Skin Breakdown Breakdown Epithelialization: None None N/A Periwound Skin Texture:  Edema: No Edema: No N/A Excoriation: No Excoriation: No Induration: No Induration: No Callus: No Callus: No Crepitus: No Crepitus: No Fluctuance: No Fluctuance: No Friable: No Friable: No Rash: No Rash: No Scarring: No Scarring: No Periwound Skin Maceration: No Maceration: No N/A Moisture: Moist: No Moist: No Dry/Scaly: No Dry/Scaly: No Periwound Skin Color: Atrophie Blanche: No Atrophie Blanche: No N/A Cyanosis: No Cyanosis: No Ecchymosis: No Ecchymosis: No Erythema: No Erythema: No Hemosiderin Staining: No Hemosiderin Staining: No Mottled: No Mottled: No Pallor: No Pallor: No Rubor: No Rubor: No Temperature: No Abnormality No Abnormality N/A Tenderness on No No N/A Palpation: Wound Preparation: Ulcer Cleansing: Ulcer Cleansing: N/A Rinsed/Irrigated with Rinsed/Irrigated with Saline Saline Topical Anesthetic Topical Anesthetic Applied: Other: lidocaine Applied: Other: lidocaine 4% 4% Treatment Notes Electronic Signature(s) Signed: 11/06/2014 3:37:55 PM By: Montey Hora Entered By: Montey Hora on 11/06/2014 12:03:49 Cheyenne Riggs ([id number]) -------------------------------------------------------------------------------- [place] Details Patient Name: Cheyenne Riggs. Date of Service: 11/06/2014 11:00 AM Medical Record Number: [id number] Patient Account Number: [id number] Date of Birth/Sex: 1945/06/03 (68 y.o. Female) Treating RN: Montey Hora Primary Care Physician: Nicholas Lose Other Clinician: Referring Physician: Hulan Fess Treating Physician/Extender: Frann Rider in Treatment: 0 Active Inactive Abuse / Safety / Falls / Self Care Management Nursing Diagnoses: Potential for falls Goals: Patient will remain injury free Date Initiated: 11/06/2014 Goal Status: Active Interventions: Assess fall risk on admission and as needed Notes: Necrotic Tissue Nursing Diagnoses: Impaired tissue integrity related to  necrotic/devitalized tissue Goals: Patient/caregiver will verbalize understanding of reason and process for debridement of necrotic tissue Date Initiated: 11/06/2014 Goal Status: Active Interventions: Provide education on necrotic tissue and debridement process Notes: Nutrition Nursing Diagnoses: Potential for alteratiion in Nutrition/Potential for imbalanced nutrition Goals: Patient/caregiver agrees to and verbalizes understanding of need to use nutritional supplements and/or vitamins as prescribed REANNA, SCOGGIN ([id number]) Date Initiated: 11/06/2014 Goal Status: Active Interventions: Assess patient nutrition upon admission and as needed per policy Notes: Orientation to the Wound Care Program Nursing Diagnoses: Knowledge deficit related to the wound healing center program Goals: Patient/caregiver will verbalize understanding of the [facility] Date Initiated: 11/06/2014 Goal Status: Active Interventions: Provide education on orientation to the wound center Notes: Wound/Skin Impairment Nursing Diagnoses: Impaired tissue integrity Goals: Ulcer/skin breakdown will have a volume reduction of 30% by week 4 Date Initiated: 11/06/2014 Goal Status: Active Interventions: Assess ulceration(s) every visit Notes: Electronic Signature(s) Signed: 11/06/2014 3:37:55 PM By: Montey Hora Entered By: Montey Hora on 11/06/2014 12:00:43 Cheyenne Riggs ([id number]) -------------------------------------------------------------------------------- Patient/Caregiver Education Details Patient Name: Cheyenne Riggs. Date of Service: 11/06/2014 11:00 AM Medical Record Number: [id number] Patient Account Number: [id number] Date of Birth/Gender: Apr 26, 1946 (68 y.o. Female) Treating RN: Montey Hora Primary Care Physician: Nicholas Lose Other Clinician: Referring Physician: Hulan Fess Treating Physician/Extender: Frann Rider in Treatment: 0 Education  Assessment Education Provided To: Patient Education Topics Provided Wound/Skin Impairment: Handouts: Other: wound care as ordered Methods: Demonstration, Explain/Verbal Responses: State content correctly Electronic Signature(s) Signed: 11/06/2014 2:07:06 PM By: Montey Hora Entered By:  Montey Hora on 11/06/2014 14:07:06 LAVORA, BRISBON ([id number]) -------------------------------------------------------------------------------- Wound Assessment Details Patient Name: ADDILEE, NEU. Date of Service: 11/06/2014 11:00 AM Medical Record Number: [id number] Patient Account Number: [id number] Date of Birth/Sex: 06-02-1945 (68 y.o. Female) Treating RN: Montey Hora Primary Care Physician: Nicholas Lose Other Clinician: Referring Physician: Hulan Fess Treating Physician/Extender: Frann Rider in Treatment: 0 Wound Status Wound Number: 1 Primary Pressure Ulcer Etiology: Wound Location: Right Calcaneous Wound Open Wounding Event: Pressure Injury Status: Date Acquired: 08/18/2014 Comorbid Arrhythmia, Congestive Heart Failure, Weeks Of Treatment: 0 History: Hypertension, Peripheral Venous Clustered Wound: No Disease, Type II Diabetes, Neuropathy, Received Chemotherapy Photos Photo Uploaded By: Montey Hora on 11/06/2014 15:17:41 Wound Measurements Length: (cm) 0.7 Width: (cm) 0.4 Depth: (cm) 0.1 Area: (cm) 0.22 Volume: (cm) 0.022 % Reduction in Area: -100% % Reduction in Volume: -100% Epithelialization: None Tunneling: No Undermining: No Wound Description Classification: Category/Stage II Foul Odor A Diabetic Severity (Wagner): Grade 1 Wound Margin: Flat and Intact Exudate Amount: Small Exudate Type: Serous Exudate Color: amber fter Cleansing: No Wound Bed Granulation Amount: None Present (0%) Exposed Structure Necrotic Amount: None Present (0%) Fascia Exposed: No Kugel, Dorian P. ([id number]) Fat Layer Exposed: No Tendon Exposed: No Muscle Exposed:  No Joint Exposed: No Bone Exposed: No Limited to Skin Breakdown Periwound Skin Texture Texture Color No Abnormalities Noted: No No Abnormalities Noted: No Callus: No Atrophie Blanche: No Crepitus: No Cyanosis: No Excoriation: No Ecchymosis: No Fluctuance: No Erythema: No Friable: No Hemosiderin Staining: No Induration: No Mottled: No Localized Edema: No Pallor: No Rash: No Rubor: No Scarring: No Temperature / Pain Moisture Temperature: No Abnormality No Abnormalities Noted: No Dry / Scaly: No Maceration: No Moist: No Wound Preparation Ulcer Cleansing: Rinsed/Irrigated with Saline Topical Anesthetic Applied: Other: lidocaine 4%, Treatment Notes Wound #1 (Right Calcaneous) 1. Cleansed with: Clean wound with Normal Saline 2. Anesthetic Topical Lidocaine 4% cream to wound bed prior to debridement 4. Dressing Applied: Prisma Ag 5. Secondary Dressing Applied Bordered Foam Dressing Electronic Signature(s) Signed: 11/06/2014 3:37:55 PM By: Montey Hora Entered By: Montey Hora on 11/06/2014 12:05:13 Cheyenne Riggs ([id number]) -------------------------------------------------------------------------------- Wound Assessment Details Patient Name: Cheyenne Riggs. Date of Service: 11/06/2014 11:00 AM Medical Record Number: [id number] Patient Account Number: [id number] Date of Birth/Sex: 1945/05/20 (68 y.o. Female) Treating RN: Montey Hora Primary Care Physician: Nicholas Lose Other Clinician: Referring Physician: Hulan Fess Treating Physician/Extender: Frann Rider in Treatment: 0 Wound Status Wound Number: 2 Primary Diabetic Wound/Ulcer of the Lower Etiology: Extremity Wound Location: Right Toe Great Wound Open Wounding Event: Gradually Appeared Status: Date Acquired: 08/18/2014 Comorbid Arrhythmia, Congestive Heart Failure, Weeks Of Treatment: 0 History: Hypertension, Peripheral Venous Clustered Wound: No Disease, Type II Diabetes,  Neuropathy, Received Chemotherapy Photos Photo Uploaded By: Montey Hora on 11/06/2014 15:17:42 Wound Measurements Length: (cm) 2.5 % Reduction in Width: (cm) 1.7 % Reduction in Depth: (cm) 0.3 Epithelializati Area: (cm) 3.338 Tunneling: Volume: (cm) 1.001 Undermining: Area: 0% Volume: 0% on: None No No Wound Description Classification: Unable to visualize wound bed Wound Margin: Flat and Intact Exudate Amount: Small Exudate Type: Serous Exudate Color: amber Foul Odor After Cleansing: No Wound Bed Granulation Amount: None Present (0%) Exposed Structure Necrotic Amount: Large (67-100%) Fascia Exposed: No Necrotic Quality: Eschar Fat Layer Exposed: No Yandow, Jaydin P. ([id number]) Tendon Exposed: No Muscle Exposed: No Joint Exposed: No Bone Exposed: No Limited to Skin Breakdown Periwound Skin Texture Texture Color No Abnormalities Noted: No No Abnormalities Noted: No Callus: No Atrophie Blanche: No Crepitus: No  Cyanosis: No Excoriation: No Ecchymosis: No Fluctuance: No Erythema: No Friable: No Hemosiderin Staining: No Induration: No Mottled: No Localized Edema: No Pallor: No Rash: No Rubor: No Scarring: No Temperature / Pain Moisture Temperature: No Abnormality No Abnormalities Noted: No Dry / Scaly: No Maceration: No Moist: No Wound Preparation Ulcer Cleansing: Rinsed/Irrigated with Saline Topical Anesthetic Applied: Other: lidocaine 4%, Treatment Notes Wound #2 (Right Toe Great) 1. Cleansed with: Clean wound with Normal Saline 2. Anesthetic Topical Lidocaine 4% cream to wound bed prior to debridement 4. Dressing Applied: Aquacel Ag 5. Secondary Dressing Applied Bordered Foam Dressing Electronic Signature(s) Signed: 11/06/2014 3:37:55 PM By: Montey Hora Entered By: Montey Hora on 11/06/2014 11:34:59 Cheyenne Riggs ([id number]) -------------------------------------------------------------------------------- Vitals Details Patient  Name: Cheyenne Riggs. Date of Service: 11/06/2014 11:00 AM Medical Record Number: [id number] Patient Account Number: [id number] Date of Birth/Sex: 1945-10-12 (68 y.o. Female) Treating RN: Montey Hora Primary Care Physician: Nicholas Lose Other Clinician: Referring Physician: Hulan Fess Treating Physician/Extender: Frann Rider in Treatment: 0 Vital Signs Time Taken: 11:17 Temperature (F): 98.5 Height (in): 63 Pulse (bpm): 65 Source: Stated Respiratory Rate (breaths/min): 18 Weight (lbs): 157 Blood Pressure (mmHg): 152/56 Source: Stated Reference Range: 80 - 120 mg / dl Body Mass Index (BMI): 27.8 Electronic Signature(s) Signed: 11/06/2014 3:37:55 PM By: Montey Hora Entered By: Montey Hora on 11/06/2014 11:18:24

## 2014-11-06 NOTE — Progress Notes (Signed)
ZORIANA, OATS ([id number]) Visit Report for 11/06/2014 Chief Complaint Document Details Patient Name: Cheyenne Riggs, Cheyenne Riggs 11/06/2014 11:00 Date of Service: AM Medical Record [id number] Number: Patient Account Number: [id number] 15-Feb-1946 (69 y.o. Treating RN: Date of Birth/Sex: Female) Other Clinician: Primary Care Physician: Geronimo Running Referring Physician: Hulan Fess Physician/Extender: Suella Grove in Treatment: 0 Information Obtained from: Patient Chief Complaint Patient presents to the wound care center for a consult due non healing wound. extremities-year-old patient who is coming with a ulcer on the left heel and left big toe for about 2-1/2 months. Electronic Signature(s) Signed: 11/06/2014 12:28:45 PM By: Christin Fudge MD, FACS Entered By: Christin Fudge on 11/06/2014 12:28:45 Cheyenne Riggs ([id number]) -------------------------------------------------------------------------------- Debridement Details Patient Name: Cheyenne, Riggs 11/06/2014 11:00 Date of Service: AM Medical Record [id number] Number: Patient Account Number: [id number] 1945-10-28 (69 y.o. Treating RN: Date of Birth/Sex: Female) Other Clinician: Primary Care Physician: Durenda Hurt, Jamyra Zweig Referring Physician: Hulan Fess Physician/Extender: Suella Grove in Treatment: 0 Debridement Performed for Wound #1 Right Calcaneous Assessment: Performed By: Physician Pat Patrick., MD Debridement: Debridement Pre-procedure Yes Verification/Time Out Taken: Start Time: 12:01 Pain Control: Lidocaine 4% Topical Solution Level: Skin/Subcutaneous Tissue Total Area Debrided (L x 0.7 (cm) x 0.4 (cm) = 0.28 (cm) W): Tissue and other Viable, Non-Viable, Eschar, Fibrin/Slough, Skin, Subcutaneous material debrided: Instrument: Forceps, Scissors Bleeding: Minimum Hemostasis Achieved: Pressure End Time: 12:05 Procedural Pain: 0 Post Procedural Pain: 0 Response to Treatment:  Procedure was tolerated well Post Debridement Measurements of Total Wound Length: (cm) 0.7 Stage: Category/Stage II Width: (cm) 0.4 Depth: (cm) 0.1 Volume: (cm) 0.022 Electronic Signature(s) Signed: 11/06/2014 12:27:11 PM By: Christin Fudge MD, FACS Entered By: Christin Fudge on 11/06/2014 12:27:11 Cheyenne Riggs ([id number]) -------------------------------------------------------------------------------- HPI Details Patient Name: Cheyenne Riggs 11/06/2014 11:00 Date of Service: AM Medical Record [id number] Number: Patient Account Number: [id number] 04/05/1946 (69 y.o. Treating RN: Date of Birth/Sex: Female) Other Clinician: Primary Care Physician: Geronimo Running Referring Physician: Hulan Fess Physician/Extender: Weeks in Treatment: 0 History of Present Illness Location: left big toe and left heel Quality: Patient reports No Pain. Severity: Patient states wound (s) are getting better. Duration: Patient has had the wound for > 3 months prior to seeking treatment at the wound center Context: The wound occurred when the patient was admitted to hospital with a kidney failure. Modifying Factors: Consults to this date include:vascular opinion which was negative for any embolic phenomena or vascular problems. Associated Signs and Symptoms: Patient reports having difficulty standing for long periods. HPI Description: exterior-year-old patient who is known to have essential hypertension, type 2 diabetes mellitus, chronic kidney disease, trigger tobacco dependence now reformed smoker, recent problems with kidney failure. During her hospitalization recently she developed hypotension tachycardia and several other complications and during that time it was noted that all her toes and fingertips were mottled and she was seen by by vascular surgery. incidental Dr. Doren Custard has done a study and was told essentially that she did not require any angiographic surgery or  angioplasty. She has recovered from acute kidney failure with tubular necrosis, possible myocardial infarction, thrombocytopenia and has now been also diagnosed with breast cancer and she's been to take chemotherapy and radiation. Electronic Signature(s) Signed: 11/06/2014 12:31:20 PM By: Christin Fudge MD, FACS Entered By: Christin Fudge on 11/06/2014 12:31:20 Cheyenne Riggs ([id number]) -------------------------------------------------------------------------------- Physical Exam Details Patient Name: Cheyenne Riggs, Cheyenne Riggs 11/06/2014 11:00 Date of Service: AM Medical Record [id number] Number: Patient Account Number: [id number] 06-01-1945 (69  y.o. Treating RN: Date of Birth/Sex: Female) Other Clinician: Primary Care Physician: Nicholas Lose Treating Tambi Thole Referring Physician: Hulan Fess Physician/Extender: Weeks in Treatment: 0 Constitutional . Pulse regular. Respirations normal and unlabored. Afebrile. . Eyes Nonicteric. Reactive to light. Ears, Nose, Mouth, and Throat Lips, teeth, and gums WNL.Marland Kitchen Moist mucosa without lesions . Neck supple and nontender. No palpable supraclavicular or cervical adenopathy. Normal sized without goiter. Respiratory WNL. No retractions.. Cardiovascular Pedal Pulses WNL. ABI on the left is 1.15 on the right is 1.29. No clubbing, cyanosis or edema. Musculoskeletal Adexa without tenderness or enlargement.. Digits and nails w/o clubbing, cyanosis, infection, petechiae, ischemia, or inflammatory conditions.. Integumentary (Hair, Skin) she has a small area of dry eschar on the left heel and when this was gently debrided there is an open ulcer there which is superficial and a stage II ulceration.. No crepitus or fluctuance. No peri-wound warmth or erythema. No masses.Marland Kitchen Psychiatric Judgement and insight Intact.. No evidence of depression, anxiety, or agitation.. Notes She has a black eschar on the tip of her left big toe and this is not completely  demarcated yet and not able to separate it. Electronic Signature(s) Signed: 11/06/2014 12:32:36 PM By: Christin Fudge MD, FACS Entered By: Christin Fudge on 11/06/2014 12:32:36 Cheyenne Riggs ([id number]) -------------------------------------------------------------------------------- Physician Orders Details Patient Name: Cheyenne Riggs, Cheyenne Riggs 11/06/2014 11:00 Date of Service: AM Medical Record [id number] Number: Patient Account Number: [id number] [birth year] (69 y.o. Treating RN: Montey Hora Date of Birth/Sex: Female) Other Clinician: Primary Care Physician: Geronimo Running Referring Physician: Hulan Fess Physician/Extender: Suella Grove in Treatment: 0 Verbal / Phone Orders: Yes Clinician: Montey Hora Read Back and Verified: Yes Diagnosis Coding Wound Cleansing Wound #1 Right Calcaneous o Clean wound with Normal Saline. Wound #2 Right Toe Great o Clean wound with Normal Saline. Anesthetic Wound #1 Right Calcaneous o Topical Lidocaine 4% cream applied to wound bed prior to debridement Wound #2 Right Toe Great o Topical Lidocaine 4% cream applied to wound bed prior to debridement Primary Wound Dressing Wound #1 Right Calcaneous o Prisma Ag Wound #2 Right Toe Great o Other: - betadine paint Secondary Dressing Wound #1 Right Calcaneous o Boardered Foam Dressing Wound #2 Right Toe Great o Boardered Foam Dressing Dressing Change Frequency Wound #1 Right Calcaneous o Change dressing every day. Wound #2 Right Toe Great o Change dressing every day. Cheyenne Riggs, Cheyenne Riggs ([id number]) Follow-up Appointments Wound #1 Right Calcaneous o Return Appointment in 1 week. Wound #2 Right Toe Great o Return Appointment in 1 week. Home Health Wound #1 Right [place] Visits - [place] Nurse may visit PRN to address patientos wound care needs. o FACE TO FACE ENCOUNTER: MEDICARE and MEDICAID PATIENTS: I  certify that this patient is under my care and that I had a face-to-face encounter that meets the physician face-to-face encounter requirements with this patient on this date. The encounter with the patient was in whole or in part for the following MEDICAL CONDITION: (primary reason for [place]) MEDICAL NECESSITY: I certify, that based on my findings, NURSING services are a medically necessary home health service. HOME BOUND STATUS: I certify that my clinical findings support that this patient is homebound (i.e., Due to illness or injury, pt requires aid of supportive devices such as crutches, cane, wheelchairs, walkers, the use of special transportation or the assistance of another person to leave their place of residence. There is a normal inability to leave the home and doing so requires  considerable and taxing effort. Other absences are for medical reasons / religious services and are infrequent or of short duration when for other reasons). o If current dressing causes regression in wound condition, may D/C ordered dressing product/s and apply Normal Saline Moist Dressing daily until next [place] / Other MD appointment. [place] of regression in wound condition at [phone]. o Please direct any NON-WOUND related issues/requests for orders to patient's Primary Care Physician Wound #2 Right Toe [place] Visits - [place] Nurse may visit PRN to address patientos wound care needs. o FACE TO FACE ENCOUNTER: MEDICARE and MEDICAID PATIENTS: I certify that this patient is under my care and that I had a face-to-face encounter that meets the physician face-to-face encounter requirements with this patient on this date. The encounter with the patient was in whole or in part for the following MEDICAL CONDITION: (primary reason for [place]) MEDICAL NECESSITY: I certify, that based on my findings, NURSING  services are a medically necessary home health service. HOME BOUND STATUS: I certify that my clinical findings support that this patient is homebound (i.e., Due to illness or injury, pt requires aid of supportive devices such as crutches, cane, wheelchairs, walkers, the use of special transportation or the assistance of another person to leave their place of residence. There is a normal inability to leave the home and doing so requires considerable and taxing effort. Other absences are for medical reasons / religious services and are infrequent or of short duration when for other reasons). o If current dressing causes regression in wound condition, may D/C ordered dressing product/s and apply Normal Saline Moist Dressing daily until next [place] / Other MD appointment. [place] of regression in wound condition at [phone]. MAYLEA, SORIA ([id number]) o Please direct any NON-WOUND related issues/requests for orders to patient's Primary Care Physician Electronic Signature(s) Signed: 11/06/2014 12:36:46 PM By: Christin Fudge MD, FACS Signed: 11/06/2014 3:37:55 PM By: Montey Hora Entered By: Montey Hora on 11/06/2014 12:19:04 Cheyenne Riggs ([id number]) -------------------------------------------------------------------------------- Problem List Details Patient Name: Cheyenne Riggs, Cheyenne Riggs 11/06/2014 11:00 Date of Service: AM Medical Record [id number] Number: Patient Account Number: [id number] 01-14-1946 (68 y.o. 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 Z92.21  Personal history of antineoplastic chemotherapy 11/06/2014 Yes Z87.891 Personal history of nicotine dependence 11/06/2014 Yes L89.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, stage 2 11/06/2014 Yes Inactive Problems Resolved Problems Electronic Signature(s) Signed: 11/06/2014 12:28:04 PM By: Christin Fudge MD, FACS Previous Signature: 11/06/2014 12:26:26 PM Version By: Christin Fudge MD, FACS Entered By: Christin Fudge on 11/06/2014 12:28:04 Cheyenne Riggs ([id number]) -------------------------------------------------------------------------------- Progress Note Details Patient Name: Cheyenne Riggs, Cheyenne Riggs 11/06/2014 11:00 Date of Service: AM Medical Record [id number] Number: Patient Account Number: [id number] 01-21-1946 (69 y.o. Treating RN: Date of Birth/Sex: Female) Other Clinician: Primary Care Physician: Geronimo Running Referring Physician: Hulan Fess Physician/Extender: Suella Grove in Treatment: 0 Subjective Chief Complaint Information obtained from Patient Patient presents to the wound care center for a consult due non healing wound. extremities-year-old patient who is coming with a ulcer on the left heel and left big toe for about 2-1/2 months. History  of Present Illness (HPI) The following HPI elements were documented for the patient's wound: Location: left big toe and left heel Quality: Patient reports No Pain. Severity: Patient states wound (s) are getting better. Duration: Patient has had the wound for > 3 months prior to seeking treatment at the wound center Context: The wound occurred when the patient was admitted to hospital with a kidney failure. Modifying Factors: Consults to this date include:vascular opinion which was negative for any embolic phenomena or vascular problems. Associated Signs and Symptoms: Patient reports having difficulty standing for long periods. exterior-year-old patient who is known to have essential hypertension, type 2 diabetes mellitus,  chronic kidney disease, trigger tobacco dependence now reformed smoker, recent problems with kidney failure. During her hospitalization recently she developed hypotension tachycardia and several other complications and during that time it was noted that all her toes and fingertips were mottled and she was seen by by vascular surgery. incidental Dr. Doren Custard has done a study and was told essentially that she did not require any angiographic surgery or angioplasty. She has recovered from acute kidney failure with tubular necrosis, possible myocardial infarction, thrombocytopenia and has now been also diagnosed with breast cancer and she's been to take chemotherapy and radiation. Wound History Patient presents with 2 open wounds that have been present for approximately 3 months. Patient has been treating wounds in the following manner: boardered foam. Laboratory tests have not been performed in the last month. Patient reportedly has not tested positive for an antibiotic resistant organism. Patient reportedly has not tested positive for osteomyelitis. Patient reportedly has had testing performed to evaluate circulation in the legs. Patient History Information obtained from Patient. Cheyenne Riggs, Cheyenne Riggs ([id number]) Allergies Demerol, novacaine, codeine, Cipro, Lipitor, Vytorin, lovastatin, Septra, NSAIDS (Non-Steroidal Anti- Inflammatory Drug), Crestor (Reaction: doses higher than 40mg ) Social History Former smoker, Marital Status - Widowed, Alcohol Use - Never, Drug Use - No History, Caffeine Use - Never. Medical History Eyes Denies history of Cataracts, Glaucoma, Optic Neuritis Ear/Nose/Mouth/Throat Denies history of Chronic sinus problems/congestion, Middle ear problems Hematologic/Lymphatic Denies history of Anemia, Hemophilia, Human Immunodeficiency Virus, Lymphedema, Sickle Cell Disease Respiratory Denies history of Aspiration, Asthma, Chronic Obstructive Pulmonary Disease (COPD),  Pneumothorax, Sleep Apnea, Tuberculosis Cardiovascular Patient has history of Arrhythmia, Congestive Heart Failure, Hypertension, Peripheral Venous Disease Denies history of Angina, Coronary Artery Disease, Deep Vein Thrombosis, Hypotension, Myocardial Infarction, Peripheral Arterial Disease, Phlebitis, Vasculitis Gastrointestinal Denies history of Cirrhosis , Colitis, Crohn s, Hepatitis A, Hepatitis B, Hepatitis C Endocrine Patient has history of Type II Diabetes Denies history of Type I Diabetes Genitourinary Denies history of End Stage Renal Disease Immunological Denies history of Lupus Erythematosus, Raynaud s, Scleroderma Integumentary (Skin) Denies history of History of Burn, History of pressure wounds Musculoskeletal Denies history of Gout, Rheumatoid Arthritis, Osteoarthritis, Osteomyelitis Neurologic Patient has history of Neuropathy Denies history of Dementia, Quadriplegia, Paraplegia, Seizure Disorder Oncologic Patient has history of Received Chemotherapy Denies history of Received Radiation Psychiatric Denies history of Anorexia/bulimia, Confinement Anxiety Blood sugar is not tested. Medical And Surgical History Notes Oncologic breast cancer s/p lumpectomy 2015 Cheyenne Riggs, Cheyenne Riggs ([id number]) Review of Systems (ROS) Constitutional Symptoms (General Health) The patient has no complaints or symptoms. Eyes Complains or has symptoms of Glasses / Contacts - glasses. Denies complaints or symptoms of Dry Eyes, Vision Changes. Ear/Nose/Mouth/Throat The patient has no complaints or symptoms. Hematologic/Lymphatic The patient has no complaints or symptoms. Respiratory The patient has no complaints or symptoms. Cardiovascular Complains or has symptoms of LE edema. Denies complaints  or symptoms of Chest pain. Gastrointestinal The patient has no complaints or symptoms. Endocrine The patient has no complaints or symptoms. Genitourinary Complains or has symptoms of Kidney  failure/ Dialysis - CKD. Denies complaints or symptoms of Incontinence/dribbling. Immunological The patient has no complaints or symptoms. Integumentary (Skin) The patient has no complaints or symptoms. Musculoskeletal The patient has no complaints or symptoms. Neurologic The patient has no complaints or symptoms. Psychiatric The patient has no complaints or symptoms. Medications lorazepam 0.5 mg tablet oral tablet oral gabapentin 100 mg capsule oral capsule oral ondansetron 8 mg disintegrating tablet oral tablet,disintegrating oral metoprolol tartrate 25 mg tablet oral tablet oral Eliquis 5 mg tablet oral tablet oral ranitidine 150 mg tablet oral tablet oral Lasix 40 mg tablet oral tablet oral Klor-Con 10 mEq tablet,extended release oral tablet extended release oral fluoxetine 10 mg capsule oral capsule oral EMLA 2.5 %-2.5 % topical cream topical cream topical Vitamin D3 1,000 unit tablet oral tablet oral Bohac, Ludean P. ([id number]) Objective Constitutional Pulse regular. Respirations normal and unlabored. Afebrile. Vitals Time Taken: 11:17 AM, Height: 63 in, Source: Stated, Weight: 157 lbs, Source: Stated, BMI: 27.8, Temperature: 98.5 F, Pulse: 65 bpm, Respiratory Rate: 18 breaths/min, Blood Pressure: 152/56 mmHg. Eyes Nonicteric. Reactive to light. Ears, Nose, Mouth, and Throat Lips, teeth, and gums WNL.Marland Kitchen Moist mucosa without lesions . Neck supple and nontender. No palpable supraclavicular or cervical adenopathy. Normal sized without goiter. Respiratory WNL. No retractions.. Cardiovascular Pedal Pulses WNL. ABI on the left is 1.15 on the right is 1.29. No clubbing, cyanosis or edema. Musculoskeletal Adexa without tenderness or enlargement.. Digits and nails w/o clubbing, cyanosis, infection, petechiae, ischemia, or inflammatory conditions.Marland Kitchen Psychiatric Judgement and insight Intact.. No evidence of depression, anxiety, or agitation.. General Notes: She has a black  eschar on the tip of her left big toe and this is not completely demarcated yet and not able to separate it. Integumentary (Hair, Skin) she has a small area of dry eschar on the left heel and when this was gently debrided there is an open ulcer there which is superficial and a stage II ulceration.. No crepitus or fluctuance. No peri-wound warmth or erythema. No masses.. Wound #1 status is Open. Original cause of wound was Pressure Injury. The wound is located on the Right Calcaneous. The wound measures 0.7cm length x 0.4cm width x 0.1cm depth; 0.22cm^2 area and 0.022cm^3 volume. The wound is limited to skin breakdown. There is no tunneling or undermining noted. There is a small amount of serous drainage noted. The wound margin is flat and intact. There is no granulation within the wound bed. There is no necrotic tissue within the wound bed. The periwound skin appearance did not exhibit: Callus, Crepitus, Excoriation, Fluctuance, Friable, Induration, Localized Edema, Rash, Scarring, Dry/Scaly, Maceration, Moist, Atrophie Blanche, Cyanosis, Ecchymosis, Hemosiderin Mckinnie, Erva P. ([id number]) Staining, Mottled, Pallor, Rubor, Erythema. Periwound temperature was noted as No Abnormality. Wound #2 status is Open. Original cause of wound was Gradually Appeared. The wound is located on the Right Toe Great. The wound measures 2.5cm length x 1.7cm width x 0.3cm depth; 3.338cm^2 area and 1.001cm^3 volume. The wound is limited to skin breakdown. There is no tunneling or undermining noted. There is a small amount of serous drainage noted. The wound margin is flat and intact. There is no granulation within the wound bed. There is a large (67-100%) amount of necrotic tissue within the wound bed including Eschar. The periwound skin appearance did not exhibit: Callus, Crepitus, Excoriation, Fluctuance, Friable, Induration,  Localized Edema, Rash, Scarring, Dry/Scaly, Maceration, Moist, Atrophie Blanche, Cyanosis,  Ecchymosis, Hemosiderin Staining, Mottled, Pallor, Rubor, Erythema. Periwound temperature was noted as No Abnormality. Assessment Active Problems ICD-10 E11.621 - Type 2 diabetes mellitus with foot ulcer L97.522 - Non-pressure chronic ulcer of other part of left foot with fat layer exposed Z92.21 - Personal history of antineoplastic chemotherapy Z87.891 - Personal history of nicotine dependence L89.622 - Pressure ulcer of left heel, stage 52 69 year old patient who had recent hospitalizations to chronic medical problems and developed a pressure ulcer stage II on her left heel and a possible transient embolic event which resulted in necrosis of the skin of the tip of her left big toe. Since then she has been noted to have no acute vascular or chronic vascular insufficiency. abdomen recommend Betadine painting to the left big toe and we would use some Prisma with heel protection and offloading for her left heel. She is a recent reforms smoker and have urged her to continue stay off cigarettes. She is also been received chemotherapy for her recently diagnosed right breast cancer which is status post surgery and she will also receive radiation. She understand she will be coming on a regular basis for wound care and we will continue to monitor her closely. Procedures Wound #1 Wound #1 is a Pressure Ulcer located on the Right Calcaneous . There was a Skin/Subcutaneous Tissue Cheyenne Riggs, Cheyenne Riggs. ([id number]) Debridement [phone]) debridement with total area of 0.28 sq cm performed by Pat Patrick., MD. with the following instrument(s): Forceps and Scissors to remove Viable and Non-Viable tissue/material including Fibrin/Slough, Eschar, Skin, and Subcutaneous after achieving pain control using Lidocaine 4% Topical Solution. A time out was conducted prior to the start of the procedure. A Minimum amount of bleeding was controlled with Pressure. The procedure was tolerated well with a pain level of  0 throughout and a pain level of 0 following the procedure. Post Debridement Measurements: 0.7cm length x 0.4cm width x 0.1cm depth; 0.022cm^3 volume. Post debridement Stage noted as Category/Stage II. Plan Wound Cleansing: Wound #1 Right Calcaneous: Clean wound with Normal Saline. Wound #2 Right Toe Great: Clean wound with Normal Saline. Anesthetic: Wound #1 Right Calcaneous: Topical Lidocaine 4% cream applied to wound bed prior to debridement Wound #2 Right Toe Great: Topical Lidocaine 4% cream applied to wound bed prior to debridement Primary Wound Dressing: Wound #1 Right Calcaneous: Prisma Ag Wound #2 Right Toe Great: Other: - betadine paint Secondary Dressing: Wound #1 Right Calcaneous: Boardered Foam Dressing Wound #2 Right Toe Great: Boardered Foam Dressing Dressing Change Frequency: Wound #1 Right Calcaneous: Change dressing every day. Wound #2 Right Toe Great: Change dressing every day. Follow-up Appointments: Wound #1 Right Calcaneous: Return Appointment in 1 week. Wound #2 Right Toe Great: Return Appointment in 1 week. Home Health: Wound #1 Right Calcaneous: Continue Home Health Visits - [place] Nurse may visit PRN to address patient s wound care needs. FACE TO FACE ENCOUNTER: MEDICARE and MEDICAID PATIENTS: I certify that this patient is under my care and that I had a face-to-face encounter that meets the physician face-to-face encounter CHIEKO, NEISES ([id number]) requirements with this patient on this date. The encounter with the patient was in whole or in part for the following MEDICAL CONDITION: (primary reason for [place]) MEDICAL NECESSITY: I certify, that based on my findings, NURSING services are a medically necessary home health service. HOME BOUND STATUS: I certify that my clinical findings support that this patient is homebound (i.e., Due  to illness or injury, pt requires aid of supportive devices such as crutches, cane,  wheelchairs, walkers, the use of special transportation or the assistance of another person to leave their place of residence. There is a normal inability to leave the home and doing so requires considerable and taxing effort. Other absences are for medical reasons / religious services and are infrequent or of short duration when for other reasons). If current dressing causes regression in wound condition, may D/C ordered dressing product/s and apply Normal Saline Moist Dressing daily until next [place] / Other MD appointment. [place] of regression in wound condition at [phone]. Please direct any NON-WOUND related issues/requests for orders to patient's Primary Care Physician Wound #2 Right Toe Great: [place] Visits - [place] Nurse may visit PRN to address patient s wound care needs. FACE TO FACE ENCOUNTER: MEDICARE and MEDICAID PATIENTS: I certify that this patient is under my care and that I had a face-to-face encounter that meets the physician face-to-face encounter requirements with this patient on this date. The encounter with the patient was in whole or in part for the following MEDICAL CONDITION: (primary reason for [place]) MEDICAL NECESSITY: I certify, that based on my findings, NURSING services are a medically necessary home health service. HOME BOUND STATUS: I certify that my clinical findings support that this patient is homebound (i.e., Due to illness or injury, pt requires aid of supportive devices such as crutches, cane, wheelchairs, walkers, the use of special transportation or the assistance of another person to leave their place of residence. There is a normal inability to leave the home and doing so requires considerable and taxing effort. Other absences are for medical reasons / religious services and are infrequent or of short duration when for other reasons). If current dressing causes regression in  wound condition, may D/C ordered dressing product/s and apply Normal Saline Moist Dressing daily until next [place] / Other MD appointment. [place] of regression in wound condition at [phone]. Please direct any NON-WOUND related issues/requests for orders to patient's Primary Care Physician 69 year old patient who had recent hospitalizations to chronic medical problems and developed a pressure ulcer stage II on her left heel and a possible transient embolic event which resulted in necrosis of the skin of the tip of her left big toe. Since then she has been noted to have no acute vascular or chronic vascular insufficiency. abdomen recommend Betadine painting to the left big toe and we would use some Prisma with heel protection and offloading for her left heel. She is a recent reforms smoker and have urged her to continue stay off cigarettes. She is also been received chemotherapy for her recently diagnosed right breast cancer which is status post surgery and she will also receive radiation. She understand she will be coming on a regular basis for wound care and we will continue to monitor her closely. AVAREY, YAEGER ([id number]) Electronic Signature(s) Signed: 11/06/2014 12:35:33 PM By: Christin Fudge MD, FACS Entered By: Christin Fudge on 11/06/2014 12:35:33 LITZI, BINNING ([id number]) -------------------------------------------------------------------------------- ROS/PFSH Details Patient Name: JAZZMIN, NEWBOLD 11/06/2014 11:00 Date of Service: AM Medical Record [id number] Number: Patient Account Number: [id number] 1945-09-21 (69 y.o. Treating RN: Montey Hora Date of Birth/Sex: Female) Other Clinician: Primary Care Physician: Geronimo Running Referring Physician: Hulan Fess Physician/Extender: Suella Grove in Treatment: 0 Information Obtained From Patient Wound History Do you currently have one or more open woundso Yes  How many  open wounds do you currently haveo 2 Approximately how long have you had your woundso 3 months How have you been treating your wound(s) until nowo boardered foam Has your wound(s) ever healed and then re-openedo No Have you had any lab work done in the past montho No Have you tested positive for an antibiotic resistant organism (MRSA, VRE)o No Have you tested positive for osteomyelitis (bone infection)o No Have you had any tests for circulation on your legso Yes Who ordered the testo Dr Cathie Olden Where was the test doneo Dr Doren Custard [facility] Eyes Complaints and Symptoms: Positive for: Glasses / Contacts - glasses Negative for: Dry Eyes; Vision Changes Medical History: Negative for: Cataracts; Glaucoma; Optic Neuritis Cardiovascular Complaints and Symptoms: Positive for: LE edema Negative for: Chest pain Medical History: Positive for: Arrhythmia; Congestive Heart Failure; Hypertension; Peripheral Venous Disease Negative for: Angina; Coronary Artery Disease; Deep Vein Thrombosis; Hypotension; Myocardial Infarction; Peripheral Arterial Disease; Phlebitis; Vasculitis Genitourinary Complaints and Symptoms: Positive for: Kidney failure/ Dialysis - CKD Negative for: Incontinence/dribbling GAYNA, BRADDY ([id number]) Medical History: Negative for: End Stage Renal Disease Constitutional Symptoms (General Health) Complaints and Symptoms: No Complaints or Symptoms Ear/Nose/Mouth/Throat Complaints and Symptoms: No Complaints or Symptoms Medical History: Negative for: Chronic sinus problems/congestion; Middle ear problems Hematologic/Lymphatic Complaints and Symptoms: No Complaints or Symptoms Medical History: Negative for: Anemia; Hemophilia; Human Immunodeficiency Virus; Lymphedema; Sickle Cell Disease Respiratory Complaints and Symptoms: No Complaints or Symptoms Medical History: Negative for: Aspiration; Asthma; Chronic Obstructive Pulmonary Disease (COPD); Pneumothorax; Sleep Apnea;  Tuberculosis Gastrointestinal Complaints and Symptoms: No Complaints or Symptoms Medical History: Negative for: Cirrhosis ; Colitis; Crohnos; Hepatitis A; Hepatitis B; Hepatitis C Endocrine Complaints and Symptoms: No Complaints or Symptoms Medical History: Positive for: Type II Diabetes Negative for: Type I Diabetes Time with diabetes: pt denies DMII MONIFAH, FREEHLING. ([id number]) Blood sugar tested every day: No Immunological Complaints and Symptoms: No Complaints or Symptoms Medical History: Negative for: Lupus Erythematosus; Raynaudos; Scleroderma Integumentary (Skin) Complaints and Symptoms: No Complaints or Symptoms Medical History: Negative for: History of Burn; History of pressure wounds Musculoskeletal Complaints and Symptoms: No Complaints or Symptoms Medical History: Negative for: Gout; Rheumatoid Arthritis; Osteoarthritis; Osteomyelitis Neurologic Complaints and Symptoms: No Complaints or Symptoms Medical History: Positive for: Neuropathy Negative for: Dementia; Quadriplegia; Paraplegia; Seizure Disorder Oncologic Medical History: Positive for: Received Chemotherapy Negative for: Received Radiation Past Medical History Notes: breast cancer s/p lumpectomy 2015 Psychiatric Complaints and Symptoms: No Complaints or Symptoms Medical History: Negative for: Anorexia/bulimia; Confinement Anxiety Family and Social History DIAMOND, JENTZ ([id number]) Former smoker; Marital Status - Widowed; Alcohol Use: Never; Drug Use: No History; Caffeine Use: Never; Financial Concerns: No; Food, Clothing or Shelter Needs: No; Support System Lacking: No; Transportation Concerns: No; Advanced Directives: No; Patient does not want information on Advanced Directives Physician Affirmation I have reviewed and agree with the above information. Electronic Signature(s) Signed: 11/06/2014 12:32:54 PM By: Christin Fudge MD, FACS Signed: 11/06/2014 3:37:55 PM By: Montey Hora Entered By:  Christin Fudge on 11/06/2014 12:32:54 Cheyenne Riggs ([id number]) -------------------------------------------------------------------------------- SuperBill Details Patient Name: Cheyenne Riggs. Date of Service: 11/06/2014 Medical Record Number: [id number] Patient Account Number: [id number] Date of Birth/Sex: [birth year] (68 y.o. Female) Treating RN: Primary Care Physician: Nicholas Lose Other Clinician: Referring Physician: Hulan Fess Treating Physician/Extender: Frann Rider in Treatment: 0 Diagnosis Coding ICD-10 Codes Code Description 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 with fat layer exposed Z92.21 Personal  history of antineoplastic chemotherapy Z87.891 Personal history of nicotine dependence L89.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, stage 2 Facility Procedures CPT4 Code Description: 35573220 99213 - WOUND CARE VISIT-LEV 3 EST PT Modifier: Quantity: 1 CPT4 Code Description: 25427062 37628 - DEB SUBQ TISSUE 20 SQ CM/< ICD-10 Description Diagnosis 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 L89.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, stage 2 Modifier: with fat lay Quantity: 1 er exposed Physician Procedures CPT4 Code Description: 3151761 60737 - WC PHYS LEVEL 4 - NEW PT ICD-10 Description Diagnosis E11.621 Type 2 diabetes mellitus with foot ulcer L89.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, stage 2 L97.522 Non-pressure chronic ulcer of other part of left foot Modifier: with fat laye Quantity: 1 r exposed CPT4 Code Description: 1062694 85462 - WC PHYS SUBQ TISS 20 SQ CM ICD-10 Description Diagnosis 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 L89.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, stage 2  CATHI, HAZAN. ([id number]) Modifier: with fat laye Quantity: 1 r exposed Electronic  Signature(s) Signed: 11/06/2014 2:04:59 PM By: Montey Hora Signed: 11/06/2014 3:11:40 PM By: Christin Fudge MD, FACS Previous Signature: 11/06/2014 12:36:14 PM Version By: Christin Fudge MD, FACS Entered By: Montey Hora on 11/06/2014 14:04:59

## 2014-11-06 NOTE — Progress Notes (Signed)
Cheyenne, Riggs ([id number]) Visit Report for 11/06/2014 Abuse/Suicide Risk Screen Details Patient Name: Cheyenne Riggs, Cheyenne Riggs 11/06/2014 11:00 Date of Service: AM Medical Record [id number] Number: Patient Account Number: [id number] 12/30/1945 (69 y.o. Treating RN: Montey Hora Date of Birth/Sex: Female) Other Clinician: Primary Care Physician: Durenda Hurt, Errol Referring Physician: Hulan Fess Physician/Extender: Weeks in Treatment: 0 Abuse/Suicide Risk Screen Items Answer ABUSE/SUICIDE RISK SCREEN: Has anyone close to you tried to hurt or harm you recentlyo No Do you feel uncomfortable with anyone in your familyo No Has anyone forced you do things that you didnot want to doo No Do you have any thoughts of harming yourselfo No Patient displays signs or symptoms of abuse and/or neglect. No Electronic Signature(s) Signed: 11/06/2014 3:37:55 PM By: Montey Hora Entered By: Montey Hora on 11/06/2014 11:24:44 [name] ([id number]) -------------------------------------------------------------------------------- Activities of Daily Living Details Patient Name: Cheyenne, Riggs 11/06/2014 11:00 Date of Service: AM Medical Record [id number] Number: Patient Account Number: [id number] [birth year] (69 y.o. Treating RN: Montey Hora Date of Birth/Sex: Female) Other Clinician: Primary Care Physician: Geronimo Running Referring Physician: Hulan Fess Physician/Extender: Suella [place] in Treatment: 0 Activities of Daily Living Items Answer Activities of Daily Living (Please select one for each item) Drive Automobile Not Able Take Medications Completely Able Use Telephone Completely Able Care for Appearance Completely Able Use Toilet Completely Able Bath / Shower Need Assistance Dress Self Completely Able Feed Self Completely Able Walk Not Able Get In / Out Bed Need Assistance Housework Need Assistance Prepare Meals Completely Golf for Self Need Assistance Electronic Signature(s) Signed: 11/06/2014 3:37:55 PM By: Montey Hora Entered By: Montey Hora on 11/06/2014 11:25:21 [name] ([id number]) -------------------------------------------------------------------------------- Education Assessment Details Patient Name: [name], Riggs 11/06/2014 11:00 Date of Service: AM Medical Record [id number] Number: Patient Account Number: [id number] February 24, 1946 (69 y.o. Treating RN: Montey Hora Date of Birth/Sex: Female) Other Clinician: Primary Care Physician: Geronimo Running Referring Physician: Hulan Fess Physician/Extender: Suella [place] in Treatment: 0 Primary Learner Assessed: Patient Learning Preferences/Education Level/Primary Language Learning Preference: Explanation, Demonstration Highest Education Level: High School Preferred Language: English Cognitive Barrier Assessment/Beliefs Language Barrier: No Translator Needed: No Memory Deficit: No Emotional Barrier: No Cultural/Religious Beliefs Affecting Medical No Care: Physical Barrier Assessment Impaired Vision: No Impaired Hearing: No Decreased Hand dexterity: No Knowledge/Comprehension Assessment Knowledge Level: Medium Comprehension Level: Medium Ability to understand written Medium instructions: Ability to understand verbal Medium instructions: Motivation Assessment Anxiety Level: Calm Cooperation: Cooperative Education Importance: Acknowledges Need Interest in Health Problems: Asks Questions Perception: Coherent Willingness to Engage in Self- Medium Management Activities: Readiness to Engage in Self- Medium Management Activities: Cheyenne, Riggs ([id number]) Electronic Signature(s) Signed: 11/06/2014 3:37:55 PM By: Montey Hora Entered By: Montey Hora on 11/06/2014 11:25:45 [name]  ([id number]) -------------------------------------------------------------------------------- Fall Risk Assessment Details Patient Name: [name]. 11/06/2014 11:00 Date of Service: AM Medical Record [id number] Number: Patient Account Number: [id number] [birth year] (69 y.o. Treating RN: Montey Hora Date of Birth/Sex: Female) Other Clinician: Primary Care Physician: Durenda Hurt, Errol Referring Physician: Hulan Fess Physician/Extender: Suella [place] in Treatment: 0 Fall Risk Assessment Items FALL RISK ASSESSMENT: History of falling - immediate or within 3 months 25 Yes Secondary diagnosis 0 No Ambulatory aid None/bed rest/wheelchair/nurse 0 Yes Crutches/cane/walker 0 No Furniture 0 No IV Access/Saline Lock 0 No Gait/Training Normal/bed rest/immobile 0 Yes Weak 0 No Impaired 0 No Mental Status Oriented to own ability 0 Yes Electronic Signature(s) Signed: 11/06/2014 3:37:55  PM By: Montey Hora Entered By: Montey Hora on 11/06/2014 11:26:07 [name] ([id number]) -------------------------------------------------------------------------------- Foot Assessment Details Patient Name: Cheyenne, Riggs 11/06/2014 11:00 Date of Service: AM Medical Record [id number] Number: Patient Account Number: [id number] 1946/05/15 (69 y.o. Treating RN: Montey Hora Date of Birth/Sex: Female) Other Clinician: Primary Care Physician: Durenda Hurt, Errol Referring Physician: Hulan Fess Physician/Extender: Weeks in Treatment: 0 Foot Assessment Items Site Locations + = Sensation present, - = Sensation absent, C = Callus, U = Ulcer R = Redness, W = Warmth, M = Maceration, PU = Pre-ulcerative lesion F = Fissure, S = Swelling, D = Dryness Assessment Right: Left: Other Deformity: No No Prior Foot Ulcer: No No Prior Amputation: No No Charcot Joint: No No Ambulatory Status: Ambulatory With Help Assistance Device: Walker Gait:  Administrator, arts) Signed: 11/06/2014 3:37:55 PM By: Montey Hora Entered By: Montey Hora on 11/06/2014 11:50:24 [name] ([name] ([id number]) -------------------------------------------------------------------------------- Nutrition Risk Assessment Details Patient Name: Cheyenne, Riggs 11/06/2014 11:00 Date of Service: AM Medical Record [id number] Number: Patient Account Number: [id number] Aug 23, 1945 (69 y.o. Treating RN: Montey Hora Date of Birth/Sex: Female) Other Clinician: Primary Care Physician: Durenda Hurt, Errol Referring Physician: Hulan Fess Physician/Extender: Weeks in Treatment: 0 Height (in): 63 Weight (lbs): 157 Body Mass Index (BMI): 27.8 Nutrition Risk Assessment Items NUTRITION RISK SCREEN: I have an illness or condition that made me change the kind and/or 0 No amount of food I eat I eat fewer than two meals per day 0 No I eat few fruits and vegetables, or milk products 0 No I have three or more drinks of beer, liquor or wine almost every day 0 No I have tooth or mouth problems that make it hard for me to eat 0 No I don't always have enough money to buy the food I need 0 No I eat alone most of the time 0 No I take three or more different prescribed or over-the-counter drugs a 1 Yes day Without wanting to, I have lost or gained 10 pounds in the last six 0 No months I am not always physically able to shop, cook and/or feed myself 0 No Nutrition Protocols Good Risk Protocol Moderate Risk Protocol Electronic Signature(s) Signed: 11/06/2014 3:37:55 PM By: Montey Hora Entered By: Montey Hora on 11/06/2014 11:26:00

## 2014-11-09 ENCOUNTER — Other Ambulatory Visit: Payer: Self-pay

## 2014-11-10 ENCOUNTER — Ambulatory Visit (HOSPITAL_BASED_OUTPATIENT_CLINIC_OR_DEPARTMENT_OTHER): Payer: Medicare Other | Admitting: Hematology and Oncology

## 2014-11-10 ENCOUNTER — Telehealth: Payer: Self-pay | Admitting: Hematology and Oncology

## 2014-11-10 ENCOUNTER — Encounter: Payer: Self-pay | Admitting: Vascular Surgery

## 2014-11-10 ENCOUNTER — Encounter: Payer: Self-pay | Admitting: Hematology and Oncology

## 2014-11-10 ENCOUNTER — Other Ambulatory Visit (HOSPITAL_BASED_OUTPATIENT_CLINIC_OR_DEPARTMENT_OTHER): Payer: Medicare Other

## 2014-11-10 ENCOUNTER — Other Ambulatory Visit: Payer: Self-pay | Admitting: *Deleted

## 2014-11-10 VITALS — BP 149/65 | HR 64 | Temp 97.6°F | Resp 17 | Ht 63.0 in | Wt 156.8 lb

## 2014-11-10 DIAGNOSIS — Z171 Estrogen receptor negative status [ER-]: Secondary | ICD-10-CM

## 2014-11-10 DIAGNOSIS — C50411 Malignant neoplasm of upper-outer quadrant of right female breast: Secondary | ICD-10-CM

## 2014-11-10 LAB — COMPREHENSIVE METABOLIC PANEL (CC13)
ALT: 10 U/L (ref 0–55)
ANION GAP: 7 meq/L (ref 3–11)
AST: 23 U/L (ref 5–34)
Albumin: 3.2 g/dL — ABNORMAL LOW (ref 3.5–5.0)
Alkaline Phosphatase: 88 U/L (ref 40–150)
BUN: 13.8 mg/dL (ref 7.0–26.0)
CALCIUM: 9.5 mg/dL (ref 8.4–10.4)
CHLORIDE: 104 meq/L (ref 98–109)
CO2: 29 meq/L (ref 22–29)
Creatinine: 1.1 mg/dL (ref 0.6–1.1)
EGFR: 54 mL/min/{1.73_m2} — ABNORMAL LOW (ref >90)
Glucose: 134 mg/dl (ref 70–140)
Potassium: 4 mEq/L (ref 3.5–5.1)
SODIUM: 141 meq/L (ref 136–145)
Total Bilirubin: 0.97 mg/dL (ref 0.20–1.20)
Total Protein: 7.3 g/dL (ref 6.4–8.3)

## 2014-11-10 LAB — CBC WITH DIFFERENTIAL/PLATELET
BASO%: 0.5 % (ref 0.0–2.0)
BASOS ABS: 0 10*3/uL (ref 0.0–0.1)
EOS%: 1.7 % (ref 0.0–7.0)
Eosinophils Absolute: 0.1 10*3/uL (ref 0.0–0.5)
HCT: 38.8 % (ref 34.8–46.6)
HEMOGLOBIN: 12.6 g/dL (ref 11.6–15.9)
LYMPH%: 21.7 % (ref 14.0–49.7)
MCH: 29 pg (ref 25.1–34.0)
MCHC: 32.3 g/dL (ref 31.5–36.0)
MCV: 89.7 fL (ref 79.5–101.0)
MONO#: 0.4 10*3/uL (ref 0.1–0.9)
MONO%: 8.5 % (ref 0.0–14.0)
NEUT#: 3.2 10*3/uL (ref 1.5–6.5)
NEUT%: 67.6 % (ref 38.4–76.8)
Platelets: 191 10*3/uL (ref 145–400)
RBC: 4.33 10*6/uL (ref 3.70–5.45)
RDW: 18.6 % — ABNORMAL HIGH (ref 11.2–14.5)
WBC: 4.7 10*3/uL (ref 3.9–10.3)
lymph#: 1 10*3/uL (ref 0.9–3.3)

## 2014-11-10 NOTE — Progress Notes (Signed)
Patient Care Team: Hulan Fess, MD as PCP - General (Family Medicine) Diamantina Monks, RN as [place] Management Luretha Rued, RN as [place] Management [place], LCSW as Education officer, museum  DIAGNOSIS: Breast cancer of upper-outer quadrant of right female breast   Staging form: Breast, AJCC 7th Edition     Clinical: Stage IA (T1c, N0, cM0) - Signed by Thea Silversmith, MD on 02/18/2014     Pathologic: Stage IIA (T2, N0, cM0) - Signed by Rulon Eisenmenger, MD on 03/12/2014   SUMMARY OF ONCOLOGIC HISTORY:   Breast cancer of upper-outer quadrant of right female breast   02/27/2014 Surgery Right breast lumpectomy: Invasive ductal carcinoma grade 3, 2.5 cm, DCIS intermediate grade, margins negative, 2 SLN negative, ER/PR HER-2 negative   03/30/2014 - 07/27/2014 Chemotherapy Adjuvant chemotherapy with dose dense Adriamycin Cytoxan x4 followed by Abraxane weekly x7/12 (stopped early due to hospitalizations and recurrent C. difficile)   05/17/2014 - 05/26/2014 Hospital Admission Neutropenic fever secondary to dental infection which led to C. difficile enterocolitis, atypical pneumonia and SIRS   08/09/2014 - 08/10/2014 Hospital Admission Hospitalization for nausea vomiting and dehydration related to diarrhea from C. 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 Chemotherapy to be stopped early because of multiple hospitalizations and complications including C. difficile is silent infection, atrial fibrillation requiring cardioversion 2, congestive heart failure, gangrene of the toe and a recent problem with shingles. She was in the rehabilitation for the past month  and was discharged home in the middle of June. She is been going to [facility] wound care to treat the gangrenous toe. Her strength is improving and she is able to get around with the help of a walker. She is able to drive for her appointments herself.  REVIEW OF SYSTEMS:   Constitutional: Denies fevers, chills or abnormal weight loss Eyes: Denies blurriness of vision Ears, nose, mouth, throat, and face: Denies mucositis or sore throat Respiratory: Denies cough, dyspnea or wheezes Cardiovascular: Denies palpitation, chest discomfort or lower extremity swelling Gastrointestinal:  Denies nausea, heartburn or change in bowel habits SkiGangrene right toemphatics: Denies new lymphadenopathy or easy bruising Neurological:Denies numbness, tingling or new weaknesses Behavioral/Psych: Mood is stable, no new changes  All other systems were reviewed with the patient and are negative.  I have reviewed the past medical history, past surgical history, social history and family history with the patient and they are unchanged from previous note.  ALLERGIES:  is allergic to ambien; ciprofloxacin; demerol; lidocaine; other; oxycodone; septra; codeine; novocain; and vancomycin.  MEDICATIONS:  Current Outpatient Prescriptions  Medication Sig Dispense Refill  . apixaban (ELIQUIS) 5 MG TABS tablet Take 1 tablet (5 mg total) by mouth 2 (two) times daily. 60 tablet 11  . FLUoxetine (PROZAC) 10 MG capsule Take 1 capsule (10 mg total) by mouth daily. 30 capsule 1  . gabapentin (NEURONTIN) 100 MG capsule Take 1 capsule (100 mg total) by mouth 2 (two) times daily.    Marland Kitchen LORazepam (ATIVAN) 0.5 MG tablet TAKE 1 TABLET BY MOUTH EVERY 8 HOURS AS NEEDED NAUSEA 30 tablet 0  . metoprolol tartrate (LOPRESSOR) 25 MG tablet Take 1 tablet (25 mg total) by mouth 2 (two) times daily. 60 tablet  3  . oxyCODONE-acetaminophen (PERCOCET/ROXICET) 5-325 MG per tablet Take by mouth every 4 (four) hours as needed for severe pain.    .  potassium chloride (MICRO-K) 10 MEQ CR capsule Take 1 capsule (10 mEq total) by mouth 2 (two) times daily. 180 capsule 3  . diphenhydrAMINE (BENADRYL) 25 MG tablet Take 25 mg by mouth daily as needed for allergies (allergies).     . furosemide (LASIX) 40 MG tablet Take 1 tablet (40 mg total) by mouth daily. (Patient not taking: Reported on 11/10/2014) 30 tablet 0  . Probiotic Product (PROBIOTIC DAILY PO) Take 1 tablet by mouth daily.      No current facility-administered medications for this visit.    PHYSICAL EXAMINATION: ECOG PERFORMANCE STATUS: 1 - Symptomatic but completely ambulatory  Filed Vitals:   11/10/14 1332  BP: 149/65  Pulse: 64  Temp: 97.6 F (36.4 C)  Resp: 17   Filed Weights   11/10/14 1332  Weight: 156 lb 12.8 oz (71.124 kg)    GENERAL:alert, no distress and comfortable SKIN: skin color, texture, turgor are normal, no rashes or significant lesions EYES: normal, Conjunctiva are pink and non-injected, sclera clear OROPHARYNX:no exudate, no erythema and lips, buccal mucosa, and tongue normal  NECK: supple, thyroid normal size, non-tender, without nodularity LYMPH:  no palpable lymphadenopathy in the cervical, axillary or inguinal LUNGS: clear to auscultation and percussion with normal breathing effort HEART: regular rate & rhythm and no murmurs and no lower extremity edema ABDOMEN:abdomen soft, non-tender and normal bowel sounds Musculoskeletal:no cyanosis of digits and no clubbing  NEURO: alert & oriented x 3 with fluent speech, no focal motor/sensory deficits   LABORATORY DATA:  I have reviewed the data as listed   Chemistry      Component Value Date/Time   NA 139 10/27/2014 1111   NA 143 08/24/2014 1223   K 3.8 10/27/2014 1111   K 3.4* 08/24/2014 1223   CL 107 10/27/2014 1111   CO2 29 10/27/2014 1111   CO2 24 08/24/2014 1223   BUN 10 10/27/2014 1111   BUN 8.9 08/24/2014 1223   CREATININE 0.90 10/27/2014 1111   CREATININE 1.2* 08/24/2014 1223       Component Value Date/Time   CALCIUM 9.4 10/27/2014 1111   CALCIUM 8.9 08/24/2014 1223   ALKPHOS 183* 09/03/2014 0511   ALKPHOS 70 08/13/2014 1012   AST 77* 09/03/2014 0511   AST 18 08/13/2014 1012   ALT 47* 09/03/2014 0511   ALT 6 08/13/2014 1012   BILITOT 1.8* 09/03/2014 0511   BILITOT 0.72 08/13/2014 1012       Lab Results  Component Value Date   WBC 4.7 11/10/2014   HGB 12.6 11/10/2014   HCT 38.8 11/10/2014   MCV 89.7 11/10/2014   PLT 191 11/10/2014   NEUTROABS 3.2 11/10/2014   ASSESSMENT & PLAN:  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 status post adjuvant chemotherapy with dose dense Adriamycin and Cytoxan 4 starting 03/30/2014 followed by Abraxane weekly 7 discontinued 07/27/2014 due to severe toxicities and hospitalizations Chemotherapy toxicities include C. difficile, neutropenic fever, dental infection, SIRS, nausea, diarrhea, fatigue, anemia, hypokalemia, CHF)  Recommendation: 1. Referral to radiation therapy for adjuvant radiation 2. This will complete her treatment since she is ER/PR negative, there is no role of antiestrogen therapy.  Congestive heart failure Atrial fibrillation Lower extremity  embolism causing gangrene: On anticoagulation, continue with this indefinitely. Multiple heart and lung issues: Slowly improving  Patient is able to drive to physical therapy in [place], her strength is improving gradually. Return to clinic in 3 months for follow-up    No orders of the defined types were placed in this encounter.   The patient has a good understanding of the overall plan. she agrees with it. she will call with any problems that may develop before the next visit here.   Rulon Eisenmenger, MD

## 2014-11-10 NOTE — Telephone Encounter (Signed)
Appointments made and avs printed for patient °

## 2014-11-10 NOTE — Patient Outreach (Addendum)
[facility]) Care Management  11/10/2014  Cheyenne Riggs 23-Apr-1946 [id number]   Assessment: Transition of care call completed.  Patient states, "I've been doing ok except for my feet hurts". She reports of being able to obtain a prescription from her primary care physician for Percocet as needed for pain that she started taking. She verbalized improvement, however,  states her pain still lingers and won't go away. Reports elevating or moving around to help ease the pain. Patient states she had wound debridement done last Friday with new treatment of Betadine and dressing change orders. Per patient, home health nurse had been assisting with dressing changes but otherwise, patient is capable of doing dressing changes herself.  Cheyenne Riggs reports swelling to both lower extremities had improved and she continues to elevate legs. She denies using Lasix that was ordered for weight gain greater than 5 pounds. Her reported weights were: 6/26 = 156 pounds; 6/27= 159.6 pounds and 6/28= 157 pounds. Reminded patient to be watchful of her weight gains in relation to calling her doctor.  Patient has a scheduled appointment today with Dr. Lindi Adie (oncologist) and Dr. Doren Custard (Cardiovascular surgeon) on 6/29. Transport had been arranged by patient with her "yardman" as claimed. Per note, [facility] social work will assist patient in completing her [facility] transport application on the next visit.  Cheyenne Riggs had been managing her own medications well without problems per her report.  Per patient, she's "doing really good" working with [facility] health services. Physical therapy has one more week with her and Occupational therapy will be ending this week.  At this time, she denies of any additional care management needs.       Plan: Will call to follow transition of care on 11/17/2014.   Cheyenne Riggs A. Kyal Arts, BSN, RN-BC [place] Coordinator Cell: [phone]

## 2014-11-10 NOTE — Assessment & Plan Note (Signed)
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 status post adjuvant chemotherapy with dose dense Adriamycin and Cytoxan 4 starting 03/30/2014 followed by Abraxane weekly 7 discontinued 07/27/2014 due to severe toxicities and hospitalizations Chemotherapy toxicities include C. difficile, neutropenic fever, dental infection, SIRS, nausea, diarrhea, fatigue, anemia, hypokalemia, CHF)  Recommendation: 1. Referral to radiation therapy for adjuvant radiation 2. This will complete her treatment since she is ER/PR negative, there is no role of antiestrogen therapy.  Congestive heart failure Lower extremity embolism causing gangrene: On anticoagulation, continue with this indefinitely. Multiple heart and lung issues  Return to clinic in 3 months for follow-up

## 2014-11-11 ENCOUNTER — Ambulatory Visit (INDEPENDENT_AMBULATORY_CARE_PROVIDER_SITE_OTHER): Payer: Medicare Other | Admitting: Vascular Surgery

## 2014-11-11 VITALS — BP 160/57 | HR 81 | Resp 16 | Ht 63.0 in | Wt 159.0 lb

## 2014-11-11 DIAGNOSIS — L97909 Non-pressure chronic ulcer of unspecified part of unspecified lower leg with unspecified severity: Secondary | ICD-10-CM | POA: Diagnosis not present

## 2014-11-11 DIAGNOSIS — I70299 Other atherosclerosis of native arteries of extremities, unspecified extremity: Secondary | ICD-10-CM | POA: Diagnosis not present

## 2014-11-11 NOTE — Progress Notes (Signed)
[facility] [place]  Patient name: Cheyenne Riggs MRN: [id number] DOB: 1945-12-09 Sex: female  REASON FOR VISIT: Follow up of ischemic toes.  HPI: Cheyenne Riggs is a 69 y.o. female who I saw in consultation in the hospital on 09/03/2014. She has a history of breast cancer and had undergone chemotherapy. She was admitted back in April with fatigue. He decided to stop taking her chemotherapy as she simply did not feel that she could tolerate this any more. During the admission she was noted to have ischemic toes and vascular surgery was consult. She was diagnosed with atrial fibrillation during that admission and started on Coumadin. Also had a CT scan of the abdomen and pelvis that admission which showed evidence of aortoiliac occlusive disease. I felt that this could potentially be a source of embolization. Previous ABIs were 100% bilaterally although these could be falsely elevated. In order to rule out a source of embolization I recommended that we proceed with arteriography.  She underwent an arteriogram on 09/28/2014. This showed that the infrarenal aorta, bilateral common iliac arteries, bilateral external iliac arteries and bilateral internal iliac arteries were patent. On the right side the dorsalis pedis artery was occluded but there was no other significant disease. On the left side the posterior tibial and peroneal arteries were occluded distally. Based on these findings, I felt that the patient had adequate circulation to heal the wounds on the left heel and left great toe. The wounds on the right foot had essentially healed. I recommended a follow up visit in 6-8 weeks.  Since I saw her last she continues to have some tightness in her feet but the wounds on the left foot have improved. She denies fever or chills. She denies significant drainage from the wounds on her left foot. The wounds on her right foot have been healed.  Past Medical History  Diagnosis Date  .  Anxiety   . Back pain   . H/O bladder problems   . Cholelithiasis   . Depression   . High blood pressure   . Hypercholesteremia   . Kidney stones   . Gum disease   . Complication of anesthesia     bp goes up  . Wears glasses   . Wears dentures     top  . Kidney stones   . Cancer   . Breast cancer    Family History  Problem Relation Age of Onset  . Stroke Mother     onset in her 21's  . Hypertension Mother   . Hyperlipidemia Mother   . Cancer - Other Father     eye cancer, black lung diseae  . Cancer Father   . Heart failure Sister   . Hypertension Sister   . Hypertension Brother   . Heart attack Neg Hx    SOCIAL HISTORY: History  Substance Use Topics  . Smoking status: Former Smoker -- 0.00 packs/day    Quit date: 05/16/2014  . Smokeless tobacco: Not on file  . Alcohol Use: No   Allergies  Allergen Reactions  . Ambien [Zolpidem Tartrate] Other (See Comments)    Per Pt: causes sleepwalking, makes loopy.  . Ciprofloxacin Nausea And Vomiting  . Demerol [Meperidine] Hives  . Lidocaine     palpatations  . Other Swelling    Eye ointments  . Oxycodone Other (See Comments)    Pt states that it makes her feel "loopy and crazy"  . Septra [Sulfamethoxazole-Trimethoprim] Nausea And Vomiting  .  Codeine Rash  . Novocain [Procaine] Palpitations  . Vancomycin Rash   Current Outpatient Prescriptions  Medication Sig Dispense Refill  . apixaban (ELIQUIS) 5 MG TABS tablet Take 1 tablet (5 mg total) by mouth 2 (two) times daily. 60 tablet 11  . diphenhydrAMINE (BENADRYL) 25 MG tablet Take 25 mg by mouth daily as needed for allergies (allergies).     Marland Kitchen FLUoxetine (PROZAC) 10 MG capsule Take 1 capsule (10 mg total) by mouth daily. 30 capsule 1  . furosemide (LASIX) 40 MG tablet Take 1 tablet (40 mg total) by mouth daily. 30 tablet 0  . gabapentin (NEURONTIN) 100 MG capsule Take 1 capsule (100 mg total) by mouth 2 (two) times daily.    Marland Kitchen LORazepam (ATIVAN) 0.5 MG tablet TAKE 1  TABLET BY MOUTH EVERY 8 HOURS AS NEEDED NAUSEA 30 tablet 0  . metoprolol tartrate (LOPRESSOR) 25 MG tablet Take 1 tablet (25 mg total) by mouth 2 (two) times daily. 60 tablet 3  . oxyCODONE-acetaminophen (PERCOCET/ROXICET) 5-325 MG per tablet Take by mouth every 4 (four) hours as needed for severe pain.    . potassium chloride (MICRO-K) 10 MEQ CR capsule Take 1 capsule (10 mEq total) by mouth 2 (two) times daily. 180 capsule 3  . Probiotic Product (PROBIOTIC DAILY PO) Take 1 tablet by mouth daily.      No current facility-administered medications for this visit.   REVIEW OF SYSTEMS: Valu.Nieves ] denotes positive finding; [  ] denotes negative finding  CARDIOVASCULAR:  [ ]  chest pain   [ ]  chest pressure   [ ]  palpitations   [ ]  orthopnea   [ ]  dyspnea on exertion   Valu.Nieves ] claudication   [ ]  rest pain   [ ]  DVT   [ ]  phlebitis PULMONARY:   [ ]  productive cough   [ ]  asthma   [ ]  wheezing NEUROLOGIC:   [ ]  weakness  [ ]  paresthesias  [ ]  aphasia  [ ]  amaurosis  [ ]  dizziness HEMATOLOGIC:   [ ]  bleeding problems   [ ]  clotting disorders MUSCULOSKELETAL:  [ ]  joint pain   [ ]  joint swelling Valu.Nieves ] leg swelling GASTROINTESTINAL: [ ]   blood in stool  [ ]   hematemesis GENITOURINARY:  [ ]   dysuria  [ ]   hematuria PSYCHIATRIC:  [ ]  history of major depression INTEGUMENTARY:  [ ]  rashes  [ ]  ulcers CONSTITUTIONAL:  [ ]  fever   [ ]  chills  PHYSICAL EXAM: Filed Vitals:   11/11/14 1544 11/11/14 1556  BP: 150/69 160/57  Pulse: 89 81  Resp: 16   Height: 5\' 3"  (1.6 m)   Weight: 159 lb (72.122 kg)    GENERAL: The patient is a well-nourished female, in no acute distress. The vital signs are documented above. CARDIOVASCULAR: There is a regular rate and rhythm. She has palpable femoral pulses. Both feet appear adequately perfused. PULMONARY: There is good air exchange bilaterally without wheezing or rales. ABDOMEN: Soft and non-tender with normal pitched bowel sounds.  MUSCULOSKELETAL: There are no major  deformities or cyanosis. NEUROLOGIC: No focal weakness or paresthesias are detected. SKIN: She has dry gangrene on the tip of the left great toe. There is a superficial ulcer on her posterior left heel. PSYCHIATRIC: The patient has a normal affect.  DATA:  I have reviewed her arteriogram. The results are described above.  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 She has excellent circulation on the right with the only finding on her arteriogram being an occluded dorsalis pedis artery distally. On the left side, the wound on the left great toe appears to be healing gradually as does the wound on the heel. 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 The anterior tibial and dorsalis pedis arteries are patent on the left. Thus there were no options for revascularization on the left although I think she has adequate circulation to heal the wounds. She is followed in the wound care center and for this reason I did not think it was necessary to follow the wounds here also. I'll be happy to see her back at any time if the wounds stop making progress. She could be considered for toe amputation of the left great toe if the tip of the toe does not auto-amputate.  I will see her back prn.  HYPERTENSION: The patient's initial blood pressure today was elevated. We repeated this and this was still elevated. We have encouraged the patient to follow up with their primary care physician for management of their blood pressure.    Deitra Mayo [facility] [place]: [phone]

## 2014-11-11 NOTE — Progress Notes (Signed)
Filed Vitals:   11/11/14 1544 11/11/14 1556  BP: 150/69 160/57  Pulse: 89 81  Resp: 16   Height: 5\' 3"  (1.6 m)   Weight: 159 lb (72.122 kg)

## 2014-11-12 ENCOUNTER — Telehealth: Payer: Self-pay | Admitting: Cardiovascular Disease

## 2014-11-12 NOTE — Telephone Encounter (Signed)
She needs to elevate her legs higher than her heart for several hours a day and then use compression hose at the other times.    It appears that she has stopped the Lasix.  She may take Lasix 40 mg a day for the next 3 days . Also add Kdur 20 meq a day when she takes the lasix See if this helps. ( she became very volume depleted when she took the lasix daily.)  See if this helps and she can contact us next week .  The more she keeps her legs elevated, the faster they will improve.

## 2014-11-12 NOTE — Telephone Encounter (Signed)
Patient reports bilateral feet swelling. 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 Previously feet swelling issues, so placed on Lasix however pt wound up on hospital with dehydration.

## 2014-11-12 NOTE — Telephone Encounter (Signed)
° ° °  Pt c/o swelling: STAT is pt has developed SOB within 24 hours  1. How long have you been experiencing swelling? Ongoing, got worse today  2. Where is the swelling located? Feet  3.  Are you currently taking a "fluid pill"? No  4.  Are you currently SOB? No  5.  Have you traveled recently? No  Pt calling stating that she has a lot of pain and tightness in both feet.

## 2014-11-12 NOTE — Telephone Encounter (Signed)
Patient reports bilateral feet swelling. 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 Previous feet swelling issues with pt placed on Lasix, however pt wound up in hospital with dehydration. Lasix discontinued.  Patient now calling with reports of bilateral feet swelling again over past month. States it was worse two weeks ago and then improved but is now starting to worsen again. Denies weepiness or tears to feet. Denies SOB/cough. Continues to be treated by Dr. Doren Custard regarding necrotic toe/tissue. States she had appt with Dr. Doren Custard yesterday and he advised her to be patient with the healing process. Patient says "that's not so easy when it's tight and hurts". Pt states she does elevate feet but she does not think this is helpful at all. States she uses a walker but it still feels like she is walking on bubbles. She would like to know if she can either move up appt with Dr. Acie Fredrickson (scheduled already for July 25th) or advisement from Dr. Acie Fredrickson on this matter. Patient declines to have DOD physician be referred to today and instead prefers to route message to Dr. Acie Fredrickson for his review tomorrow. Routed to Dr. Acie Fredrickson and Christen Bame, RN.

## 2014-11-13 ENCOUNTER — Other Ambulatory Visit
Admission: RE | Admit: 2014-11-13 | Discharge: 2014-11-13 | Disposition: A | Discharge status: Home / Self Care | Payer: Medicare Other | Source: Ambulatory Visit | Attending: Surgery | Admitting: Surgery

## 2014-11-13 ENCOUNTER — Other Ambulatory Visit: Payer: Self-pay | Admitting: Surgery

## 2014-11-13 ENCOUNTER — Encounter: Payer: Medicare Other | Attending: Surgery | Admitting: Surgery

## 2014-11-13 ENCOUNTER — Ambulatory Visit
Admission: RE | Admit: 2014-11-13 | Discharge: 2014-11-13 | Disposition: A | Discharge status: Home / Self Care | Payer: Medicare Other | Source: Ambulatory Visit | Attending: Surgery | Admitting: Surgery

## 2014-11-13 ENCOUNTER — Telehealth: Payer: Self-pay

## 2014-11-13 DIAGNOSIS — E11621 Type 2 diabetes mellitus with foot ulcer: Secondary | ICD-10-CM | POA: Diagnosis not present

## 2014-11-13 DIAGNOSIS — M869 Osteomyelitis, unspecified: Secondary | ICD-10-CM

## 2014-11-13 DIAGNOSIS — Z9221 Personal history of antineoplastic chemotherapy: Secondary | ICD-10-CM | POA: Diagnosis not present

## 2014-11-13 DIAGNOSIS — M868X7 Other osteomyelitis, ankle and foot: Secondary | ICD-10-CM | POA: Diagnosis not present

## 2014-11-13 DIAGNOSIS — Z87891 Personal history of nicotine dependence: Secondary | ICD-10-CM | POA: Insufficient documentation

## 2014-11-13 DIAGNOSIS — L97522 Non-pressure chronic ulcer of other part of left foot with fat layer exposed: Secondary | ICD-10-CM | POA: Insufficient documentation

## 2014-11-13 DIAGNOSIS — L89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, stage 2: Secondary | ICD-10-CM | POA: Insufficient documentation

## 2014-11-13 DIAGNOSIS — S91302A Unspecified open wound, left foot, initial encounter: Secondary | ICD-10-CM | POA: Diagnosis not present

## 2014-11-13 DIAGNOSIS — N189 Chronic kidney disease, unspecified: Secondary | ICD-10-CM | POA: Insufficient documentation

## 2014-11-13 DIAGNOSIS — S91102A Unspecified open wound of left great toe without damage to nail, initial encounter: Secondary | ICD-10-CM | POA: Diagnosis not present

## 2014-11-13 DIAGNOSIS — C50919 Malignant neoplasm of unspecified site of unspecified female breast: Secondary | ICD-10-CM | POA: Diagnosis not present

## 2014-11-13 DIAGNOSIS — I10 Essential (primary) hypertension: Secondary | ICD-10-CM | POA: Diagnosis not present

## 2014-11-13 NOTE — Telephone Encounter (Signed)
Office notes rcvd from [place].  Reviewed by Dr. Lindi Adie. Sent to scan.

## 2014-11-13 NOTE — Telephone Encounter (Signed)
Spoke with patient and reviewed Dr. Elmarie Shiley advice with her.  She states she is elevating her lower extremities on the lounge doctor wedge several times per day and will continue to do so.  She verbalized agreement to take Lasix 40 mg, Kdur 20 meq for 3 days and to get some calf high compression stockings.  I advised her to call me Wednesday to report how she is doing.  I advised her that if she needs assistance over the holiday weekend, to call our office and she will be routed to the on-call provider.  She verbalized understanding and agreement with plan of care.

## 2014-11-13 NOTE — Progress Notes (Addendum)
BARBARANN, KELLY ([id number]) Visit Report for 11/13/2014 Chief Complaint Document Details Patient Name: Cheyenne Riggs, Cheyenne Riggs 11/13/2014 11:00 Date of Service: AM Medical Record [id number] Number: Patient Account Number: [id number] 07-30-1945 (69 y.o. Treating RN: Date of Birth/Sex: Female) Other Clinician: Primary Care Physician: Hulan Fess Treating Adelard Sanon Referring Physician: Nicholas Lose Physician/Extender: Weeks in Treatment: 1 Information Obtained from: Patient Chief Complaint Patient presents to the wound care center for a consult due non healing wound. extremities-year-old patient who is coming with a ulcer on the left heel and left big toe for about 2-1/2 months. Electronic Signature(s) Signed: 11/13/2014 12:57:16 PM By: Christin Fudge MD, FACS Entered By: Christin Fudge on 11/13/2014 11:33:44 Cheyenne Riggs ([id number]) -------------------------------------------------------------------------------- Debridement Details Patient Name: Cheyenne Riggs, Cheyenne Riggs 11/13/2014 11:00 Date of Service: AM Medical Record [id number] Number: Patient Account Number: [id number] Oct 07, 1945 (69 y.o. Treating RN: Date of Birth/Sex: Female) Other Clinician: Primary Care Physician: Hulan Fess Treating Robel Wuertz Referring Physician: Nicholas Lose Physician/Extender: Weeks in Treatment: 1 Debridement Performed for Wound #2 Right Toe Great Assessment: Performed By: Physician Pat Patrick., MD Debridement: Debridement Pre-procedure Yes Verification/Time Out Taken: Start Time: 11:18 Pain Control: Lidocaine 4% Topical Solution Level: Skin/Subcutaneous Tissue/Muscle/Bone Total Area Debrided (L x 2.4 (cm) x 2 (cm) = 4.8 (cm) W): Tissue and other Viable, Non-Viable, Bone, Eschar, Exudate, Fibrin/Slough, Skin, material debrided: Subcutaneous Instrument: Forceps, Rongeur, Scissors Bleeding: Minimum Hemostasis Achieved: Pressure End Time: 11:20 Procedural Pain: 3 Post Procedural Pain:  0 Response to Treatment: Procedure was tolerated well Post Debridement Measurements of Total Wound Length: (cm) 2.4 Width: (cm) 2 Depth: (cm) 0.3 Volume: (cm) 1.131 Notes To debriding the wound we got to a piece of bone which was protruding at the tip of the left big toe. This was debrided with a Rogeur and the bone was sent for pathology. Electronic Signature(s) Signed: 11/13/2014 12:57:16 PM By: Christin Fudge MD, FACS Entered By: Christin Fudge on 11/13/2014 11:33:27 Cheyenne Riggs ([id number]) -------------------------------------------------------------------------------- HPI Details Patient Name: Cheyenne Riggs, Cheyenne Riggs 11/13/2014 11:00 Date of Service: AM Medical Record [id number] Number: Patient Account Number: [id number] 04-07-1946 (69 y.o. Treating RN: Date of Birth/Sex: Female) Other Clinician: Primary Care Physician: Hulan Fess Treating Athira Janowicz Referring Physician: Nicholas Lose Physician/Extender: Weeks in Treatment: 1 History of Present Illness Location: left big toe and left heel Quality: Patient reports No Pain. Severity: Patient states wound (s) are getting better. Duration: Patient has had the wound for > 3 months prior to seeking treatment at the wound center Context: The wound occurred when the patient was admitted to hospital with a kidney failure. Modifying Factors: Consults to this date include:vascular opinion which was negative for any embolic phenomena or vascular problems. Associated Signs and Symptoms: Patient reports having difficulty standing for long periods. HPI Description: 69 year old patient who is known to have essential hypertension, type 2 diabetes mellitus, chronic kidney disease, trigger tobacco dependence now reformed smoker, recent problems with kidney failure. During her hospitalization recently she developed hypotension tachycardia and several other complications and during that time it was noted that all her toes and fingertips were  mottled and she was seen by by vascular surgery. incidental Dr. Doren Custard has done a study and was told essentially that she did not require any angiographic surgery or angioplasty. She has recovered from acute kidney failure with tubular necrosis, possible myocardial infarction, thrombocytopenia and has now been also diagnosed with breast cancer and she's been to take chemotherapy and radiation. 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 Electronic Signature(s) Signed: 11/17/2014 12:24:54 PM By: Christin Fudge MD, FACS Previous Signature: 11/13/2014 12:57:16 PM Version By: Christin Fudge MD, FACS Entered By: Christin Fudge on 11/13/2014 13:32:23 Cheyenne Riggs ([id number]) -------------------------------------------------------------------------------- Physical Exam Details Patient Name: Cheyenne Riggs, Cheyenne Riggs 11/13/2014 11:00 Date of Service: AM Medical Record [id number] Number: Patient Account Number: [id number] 1946-04-12 (69 y.o. Treating RN: Date of Birth/Sex: Female) Other Clinician: Primary Care Physician: Hulan Fess Treating Anilah Huck Referring Physician: Nicholas Lose Physician/Extender: Weeks in Treatment: 1 Constitutional . Pulse regular. Respirations normal and unlabored. Afebrile. . Eyes Nonicteric. Reactive to light. Ears, Nose, Mouth, and Throat Lips, teeth, and gums WNL.Marland Kitchen Moist mucosa without lesions . Neck supple and nontender. No palpable supraclavicular or cervical adenopathy. Normal sized without goiter. Respiratory WNL. No retractions.. Cardiovascular Pedal Pulses WNL. No clubbing, cyanosis or edema. Chest Breasts symmetical and no nipple discharge.. Breast tissue WNL, no masses, lumps, or tenderness.. Gastrointestinal (GI) Abdomen without masses or tenderness.. No liver or spleen enlargement or tenderness.. Genitourinary (GU) No hydrocele, spermatocele,  tenderness of the cord, or testicular mass.Marland Kitchen Penis without lesions.Lowella Fairy without lesions. No cystocele, or rectocele. Pelvic support intact, no discharge. Marland Kitchen Urethra without masses, tenderness or scarring.Marland Kitchen Lymphatic No adneopathy. No adenopathy. No adenopathy. Musculoskeletal Adexa without tenderness or enlargement.. Digits and nails w/o clubbing, cyanosis, infection, petechiae, ischemia, or inflammatory conditions.. Integumentary (Hair, Skin) No suspicious lesions. No crepitus or fluctuance. No peri-wound warmth or erythema. No masses.Marland Kitchen Psychiatric Judgement and insight Intact.. No evidence of depression, anxiety, or agitation.. Notes Once the black eschar was removed there was bone protruding at the tip of her left big toe. The ulcerated area over the left calcaneum is a eschared over and needs debridement. Cheyenne Riggs, Cheyenne Riggs ([id number]) Electronic Signature(s) Signed: 11/13/2014 12:57:16 PM By: Christin Fudge MD, FACS Entered By: Christin Fudge on 11/13/2014 11:35:39 Cheyenne Riggs ([id number]) -------------------------------------------------------------------------------- Physician Orders Details Patient Name: Cheyenne Riggs. Date of Service: 11/13/2014 11:00 AM Medical Record Patient Account Number: [id number] Number: Afful, RN, BSN, Treating RN: Jan 20, 1946 (69 y.o. Velva Harman Date of Birth/Sex: Female) Other Clinician: Primary Care Physician: Hulan Fess Treating Rainey Kahrs Referring Physician: Nicholas Lose Physician/Extender: Suella [place] in Treatment: 1 Verbal / Phone Orders: Yes Clinician: Afful, RN, BSN, Rita Read Back and Verified: Yes Diagnosis Coding Wound Cleansing Wound #1 Right Calcaneous o Clean wound with Normal Saline. Wound #2 Right Toe Great o Clean wound with Normal Saline. Anesthetic Wound #1 Right Calcaneous o Topical Lidocaine 4% cream applied to wound bed prior to debridement Wound #2 Right Toe Great o Topical Lidocaine 4% cream applied  to wound bed prior to debridement Skin Barriers/Peri-Wound Care Wound #1 Right Calcaneous o Skin Prep Wound #2 Right Toe Great o Skin Prep Primary Wound Dressing Wound #1 Right Calcaneous o Prisma Ag Wound #2 Right Toe Great o Santyl Ointment Secondary Dressing Wound #1 Right Calcaneous o Boardered Foam Dressing Wound #2 Right Toe Great o Conform/Kerlix Cheyenne Riggs, Cheyenne Riggs ([id number]) Dressing Change Frequency Wound #1 Right Calcaneous o Change dressing every other day. Wound #2 Right Toe Great o Change dressing every day. Follow-up Appointments Wound #1 Right Calcaneous o Return Appointment in 1 week. Wound #2 Right Toe Great o Return Appointment in 1 week. Home Health Wound #1 Right [place] Visits - [place] Nurse may visit PRN to address patientos wound care needs. o FACE TO FACE ENCOUNTER: MEDICARE and  MEDICAID PATIENTS: I certify that this patient is under my care and that I had a face-to-face encounter that meets the physician face-to-face encounter requirements with this patient on this date. The encounter with the patient was in whole or in part for the following MEDICAL CONDITION: (primary reason for [place]) MEDICAL NECESSITY: I certify, that based on my findings, NURSING services are a medically necessary home health service. HOME BOUND STATUS: I certify that my clinical findings support that this patient is homebound (i.e., Due to illness or injury, pt requires aid of supportive devices such as crutches, cane, wheelchairs, walkers, the use of special transportation or the assistance of another person to leave their place of residence. There is a normal inability to leave the home and doing so requires considerable and taxing effort. Other absences are for medical reasons / religious services and are infrequent or of short duration when for other reasons). o If current dressing causes  regression in wound condition, may D/C ordered dressing product/s and apply Normal Saline Moist Dressing daily until next [place] / Other MD appointment. [place] of regression in wound condition at [phone]. o Please direct any NON-WOUND related issues/requests for orders to patient's Primary Care Physician Wound #2 Right Toe [employer] Visits - [place] Nurse may visit PRN to address patientos wound care needs. o FACE TO FACE ENCOUNTER: MEDICARE and MEDICAID PATIENTS: I certify that this patient is under my care and that I had a face-to-face encounter that meets the physician face-to-face encounter requirements with this patient on this date. The encounter with the patient was in whole or in part for the following MEDICAL CONDITION: (primary reason for [place]) MEDICAL NECESSITY: I certify, that based on my findings, NURSING services are a medically necessary home health service. HOME BOUND STATUS: I certify that my clinical findings support that this patient is homebound (i.e., Due to illness or injury, pt requires aid of supportive devices such as crutches, cane, wheelchairs, walkers, the use of special transportation or the assistance of another person to leave their place of residence. There is a BEAUTIFULL, CISAR. ([id number]) normal inability to leave the home and doing so requires considerable and taxing effort. Other absences are for medical reasons / religious services and are infrequent or of short duration when for other reasons). o If current dressing causes regression in wound condition, may D/C ordered dressing product/s and apply Normal Saline Moist Dressing daily until next [place] / Other MD appointment. [place] of regression in wound condition at [phone]. o Please direct any NON-WOUND related issues/requests for orders to patient's Primary  Care Physician Medications-please add to medication list. Wound #2 Right Toe Great o Santyl Enzymatic Ointment Laboratory o Biopsy for Pathology - bone from left great toe oooo Radiology o X-ray, foot - with focus on left great toe oooo Patient Medications Allergies: Demerol, novacaine, codeine, Cipro, Lipitor, Vytorin, lovastatin, Septra, NSAIDS (Non-Steroidal Anti-Inflammatory Drug), Crestor Notifications Medication Indication Start End Santyl 11/13/2014 DOSE topical 250 unit/gram ointment - ointment topical as directed Electronic Signature(s) Signed: 11/13/2014 11:40:47 AM By: Christin Fudge MD, FACS Previous Signature: 11/13/2014 11:32:17 AM Version By: Regan Lemming BSN, RN Entered By: Christin Fudge on 11/13/2014 11:40:46 Cheyenne Riggs ([id number]) -------------------------------------------------------------------------------- Prescription 11/13/2014 Patient Name: Cheyenne Riggs Physician: Christin Fudge MD Date of Birth: 1945/09/06 NPI#: [id number] Sex: F DEA#: [id number] Phone #: [phone] License #: Patient Address: [facility] [place], [place] [address] [facility] [address], [place], [place] [address] [phone] Allergies Demerol novacaine codeine Cipro Lipitor Vytorin lovastatin Septra NSAIDS (Non-Steroidal Anti-Inflammatory Drug) Crestor Reaction: doses higher than 40mg  Physician's Orders Santyl Enzymatic Ointment Cheyenne Riggs, Cheyenne Riggs ([id number]) Signature(s): Date(s): Cheyenne Riggs, Cheyenne Riggs ([id number]) Prescription 11/13/2014 Patient Name: Cheyenne Riggs Physician: Christin Fudge MD Date of Birth: 10-24-1945 NPI#: [id number] Sex: F DEA#: [id number] Phone #: [phone] License #: Patient Address: [place], [place] [address] [facility] [address], [place], [place]  [address] [phone] Allergies Demerol novacaine codeine Cipro Lipitor Vytorin lovastatin Septra NSAIDS (Non-Steroidal Anti-Inflammatory Drug) Crestor Reaction: doses higher than 40mg  Physician's Orders Cheyenne Riggs, Cheyenne Riggs ([id number]) o X-ray, foot - with focus on left great toe Signature(s): Date(s): Electronic Signature(s) Signed: 11/13/2014 12:57:16 PM By: Christin Fudge MD, FACS Entered By: Christin Fudge on 11/13/2014 11:40:49 Cheyenne Riggs ([id number]) --------------------------------------------------------------------------------  Problem List Details Patient Name: Cheyenne Riggs, Cheyenne Riggs 11/13/2014 11:00 Date of Service: AM Medical Record [id number] Number: Patient Account Number: [id number] [birth year] (69 y.o. 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 Z92.21 Personal history of antineoplastic chemotherapy 11/06/2014 Yes Z87.891 Personal history of nicotine dependence 11/06/2014 Yes L89.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, stage 2 11/06/2014 Yes Inactive Problems Resolved Problems Electronic Signature(s) Signed: 11/13/2014 12:57:16 PM By: Christin Fudge MD, FACS Entered By: Christin Fudge on 11/13/2014 11:31:27 Cheyenne Riggs ([id number]) -------------------------------------------------------------------------------- Progress Note Details Patient Name: Cheyenne Riggs, Cheyenne Riggs 11/13/2014 11:00 Date of Service: AM Medical Record [id number] Number: Patient Account Number: [id number] [birth year] (69 y.o. Treating RN: Date of Birth/Sex: Female) Other Clinician: Primary Care Physician: Hulan Fess  Treating Teleah Villamar Referring Physician: Nicholas Lose Physician/Extender: Weeks in Treatment: 1 Subjective Chief Complaint Information obtained from Patient Patient presents to the wound care center for a consult due non healing wound. extremities-year-old patient who is coming with a ulcer on the left heel and left big toe for about 2-1/2 months. History of Present Illness (HPI) The following HPI elements were documented for the patient's wound: Location: left big toe and left heel Quality: Patient reports No Pain. Severity: Patient states wound (s) are getting better. Duration: Patient has had the wound for > 3 months prior to seeking treatment at the wound center Context: The wound occurred when the patient was admitted to hospital with a kidney failure. Modifying Factors: Consults to this date include:vascular opinion which was negative for any embolic phenomena or vascular problems. Associated Signs and Symptoms: Patient reports having difficulty standing for long periods. 69 year old patient who is known to have essential hypertension, type 2 diabetes mellitus, chronic kidney disease, trigger tobacco dependence now reformed smoker, recent problems with kidney failure. During her hospitalization recently she developed hypotension tachycardia and several other complications and during that time it was noted that all her toes and fingertips were mottled and she was seen by by vascular surgery. incidental Dr. Doren Custard has done a study and was told essentially that she did not require any angiographic surgery or angioplasty. She has recovered from acute kidney failure with tubular necrosis, possible myocardial infarction, thrombocytopenia and has  now been also diagnosed with breast cancer and she's been to take chemotherapy and radiation. Addendum: 11/13/2014 -- Xray of the left foot was done today -- IMPRESSION: Focal osteomyelitis of the tuft of the distal phalanx of the great  toe. PORFIRIA, HEINRICH ([id number]) Objective Constitutional Pulse regular. Respirations normal and unlabored. Afebrile. Vitals Time Taken: 11:07 AM, Height: 63 in, Weight: 157 lbs, BMI: 27.8, Temperature: 97.9 F, Pulse: 66 bpm, Respiratory Rate: 16 breaths/min, Blood Pressure: 142/78 mmHg. Eyes Nonicteric. Reactive to light. Ears, Nose, Mouth, and Throat Lips, teeth, and gums WNL.Marland Kitchen Moist mucosa without lesions . Neck supple and nontender. No palpable supraclavicular or cervical adenopathy. Normal sized without goiter. Respiratory WNL. No retractions.. Cardiovascular Pedal Pulses WNL. No clubbing, cyanosis or edema. Chest Breasts symmetical and no nipple discharge.. Breast tissue WNL, no masses, lumps, or tenderness.. Gastrointestinal (GI) Abdomen without masses or tenderness.. No liver or spleen enlargement or tenderness.. Genitourinary (GU) No hydrocele, spermatocele, tenderness of the cord, or testicular mass.Marland Kitchen Penis without lesions.Lowella Fairy without lesions. No cystocele, or rectocele. Pelvic support intact, no discharge. Marland Kitchen Urethra without masses, tenderness or scarring.Marland Kitchen Lymphatic No adneopathy. No adenopathy. No adenopathy. Musculoskeletal Adexa without tenderness or enlargement.. Digits and nails w/o clubbing, cyanosis, infection, petechiae, ischemia, or inflammatory conditions.Marland Kitchen Psychiatric Judgement and insight Intact.. No evidence of depression, anxiety, or agitation.. General Notes: Once the black eschar was removed there was bone protruding at the tip of her left big toe. The ulcerated area over the left calcaneum is a eschared over and needs debridement. VORA, CLOVER ([id number]) Integumentary (Hair, Skin) No suspicious lesions. No crepitus or fluctuance. No peri-wound warmth or erythema. No masses.. Wound #1 status is Open. Original cause of wound was Pressure Injury. The wound is located on the Right Calcaneous. The wound measures 0.5cm length x 0.5cm width x  0.1cm depth; 0.196cm^2 area and 0.02cm^3 volume. The wound is limited to skin breakdown. There is no tunneling or undermining noted. There is a small amount of serous drainage noted. The wound margin is flat and intact. There is small (1-33%) granulation within the wound bed. There is a large (67-100%) amount of necrotic tissue within the wound bed including Eschar. The periwound skin appearance exhibited: Dry/Scaly. The periwound skin appearance did not exhibit: Callus, Crepitus, Excoriation, Fluctuance, Friable, Induration, Localized Edema, Rash, Scarring, Maceration, Moist, Atrophie Blanche, Cyanosis, Ecchymosis, Hemosiderin Staining, Mottled, Pallor, Rubor, Erythema. Periwound temperature was noted as No Abnormality. Wound #2 status is Open. Original cause of wound was Gradually Appeared. The wound is located on the Right Toe Great. The wound measures 2.4cm length x 2cm width x 0.3cm depth; 3.77cm^2 area and 1.131cm^3 volume. The wound is limited to skin breakdown. There is no tunneling or undermining noted. There is a small amount of serous drainage noted. The wound margin is flat and intact. There is no granulation within the wound bed. There is a large (67-100%) amount of necrotic tissue within the wound bed including Eschar. The periwound skin appearance exhibited: Moist. The periwound skin appearance did not exhibit: Callus, Crepitus, Excoriation, Fluctuance, Friable, Induration, Localized Edema, Rash, Scarring, Dry/Scaly, Maceration, Atrophie Blanche, Cyanosis, Ecchymosis, Hemosiderin Staining, Mottled, Pallor, Rubor, Erythema. Periwound temperature was noted as No Abnormality. Once the black eschar was removed there was bone protruding at the tip of her left big toe. The ulcerated area over the left calcaneum is a eschared over and needs debridement. Assessment Active Problems ICD-10 E11.621 - Type 2 diabetes mellitus with foot ulcer L97.522 - Non-pressure chronic ulcer of  other  part of left foot with fat layer exposed Z92.21 - Personal history of antineoplastic chemotherapy Z87.891 - Personal history of nicotine dependence L89.622 - Pressure ulcer of left heel, stage 2 The patient has had sharp debridement of the eschar on the left big toe and also has had a bone biopsy taken today of the protruding bone. We will go ahead and get an x-ray to see if this was only at the tip of the toe. TAITE, BALDASSARI ([id number]) review central over this area and use Prisma on her heel. She will come back and see me next week and we will discuss further plans. Procedures Wound #2 Wound #2 is a Diabetic Wound/Ulcer of the Lower Extremity located on the Right Toe Great . There was a Skin/Subcutaneous Tissue/Muscle/Bone Debridement ([id number]) debridement with total area of 4.8 sq cm performed by Kynedi Profitt, Jackson Latino., MD. with the following instrument(s): Forceps, Rongeur, and Scissors to remove Viable and Non-Viable tissue/material including Bone, Exudate, Fibrin/Slough, Eschar, Skin, and Subcutaneous after achieving pain control using Lidocaine 4% Topical Solution. A time out was conducted prior to the start of the procedure. A Minimum amount of bleeding was controlled with Pressure. The procedure was tolerated well with a pain level of 3 throughout and a pain level of 0 following the procedure. Post Debridement Measurements: 2.4cm length x 2cm width x 0.3cm depth; 1.131cm^3 volume. General Notes: To debriding the wound we got to a piece of bone which was protruding at the tip of the left big toe. This was debrided with a Rogeur and the bone was sent for pathology.. Plan Wound Cleansing: Wound #1 Right Calcaneous: Clean wound with Normal Saline. Wound #2 Right Toe Great: Clean wound with Normal Saline. Anesthetic: Wound #1 Right Calcaneous: Topical Lidocaine 4% cream applied to wound bed prior to debridement Wound #2 Right Toe Great: Topical Lidocaine 4% cream applied to wound  bed prior to debridement Skin Barriers/Peri-Wound Care: Wound #1 Right Calcaneous: Skin Prep Wound #2 Right Toe Great: Skin Prep Primary Wound Dressing: Wound #1 Right Calcaneous: Prisma Ag Wound #2 Right Toe Great: Santyl Ointment Secondary Dressing: Wound #1 Right Calcaneous: Boardered Foam Dressing Wound #2 Right Toe Great: Conform/Kerlix Dressing Change FrequencyALONIA, DIBUONO ([id number]) Wound #1 Right Calcaneous: Change dressing every other day. Wound #2 Right Toe Great: Change dressing every day. Follow-up Appointments: Wound #1 Right Calcaneous: Return Appointment in 1 week. Wound #2 Right Toe Great: Return Appointment in 1 week. Home Health: Wound #1 Right Calcaneous: Continue Home Health Visits - [facility] Nurse may visit PRN to address patient s wound care needs. FACE TO FACE ENCOUNTER: MEDICARE and MEDICAID PATIENTS: I certify that this patient is under my care and that I had a face-to-face encounter that meets the physician face-to-face encounter requirements with this patient on this date. The encounter with the patient was in whole or in part for the following MEDICAL CONDITION: (primary reason for [place]) MEDICAL NECESSITY: I certify, that based on my findings, NURSING services are a medically necessary home health service. HOME BOUND STATUS: I certify that my clinical findings support that this patient is homebound (i.e., Due to illness or injury, pt requires aid of supportive devices such as crutches, cane, wheelchairs, walkers, the use of special transportation or the assistance of another person to leave their place of residence. There is a normal inability to leave the home and doing so requires considerable and taxing effort. Other absences are for medical reasons / religious services and  are infrequent or of short duration when for other reasons). If current dressing causes regression in wound condition, may D/C ordered dressing  product/s and apply Normal Saline Moist Dressing daily until next [place] / Other MD appointment. [place] of regression in wound condition at [phone]. Please direct any NON-WOUND related issues/requests for orders to patient's Primary Care Physician Wound #2 Right Toe Great: [place] Visits - [facility] Nurse may visit PRN to address patient s wound care needs. FACE TO FACE ENCOUNTER: MEDICARE and MEDICAID PATIENTS: I certify that this patient is under my care and that I had a face-to-face encounter that meets the physician face-to-face encounter requirements with this patient on this date. The encounter with the patient was in whole or in part for the following MEDICAL CONDITION: (primary reason for [place]) MEDICAL NECESSITY: I certify, that based on my findings, NURSING services are a medically necessary home health service. HOME BOUND STATUS: I certify that my clinical findings support that this patient is homebound (i.e., Due to illness or injury, pt requires aid of supportive devices such as crutches, cane, wheelchairs, walkers, the use of special transportation or the assistance of another person to leave their place of residence. There is a normal inability to leave the home and doing so requires considerable and taxing effort. Other absences are for medical reasons / religious services and are infrequent or of short duration when for other reasons). If current dressing causes regression in wound condition, may D/C ordered dressing product/s and apply Normal Saline Moist Dressing daily until next [place] / Other MD appointment. [place] of regression in wound condition at [phone]. Please direct any NON-WOUND related issues/requests for orders to patient's Primary Care Physician Medications-please add to medication list.: Wound #2 Right Toe Great: Santyl Enzymatic  Ointment Radiology ordered were: X-ray, foot - with focus on left great toe Laboratory ordered were: Biopsy for Pathology - bone from left great toe LORRA, FREEMAN. ([id number]) The following medication(s) was prescribed: Santyl topical 250 unit/gram ointment ointment topical as directed starting 11/13/2014 The patient has had sharp debridement of the eschar on the left big toe and also has had a bone biopsy taken today of the protruding bone. We will go ahead and get an x-ray to see if this was only at the tip of the toe. review central over this area and use Prisma on her heel. She will come back and see me next week and we will discuss further plans. 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 Date of Service: 11/13/2014 Medical Record Number: [id number] Patient Account Number: [id number] Date of Birth/Sex: [birth year] (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, stage 2 Facility Procedures CPT4 Code Description: [id number] - DEB BONE  20 SQ CM/< ICD-10 Description  Diagnosis E11.621 Type 2 diabetes mellitus with foot ulcer L97.522 Non-pressure chronic ulcer of other part of left fo Z92.21 Personal history of antineoplastic chemotherapy  L89.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, stage 2 Modifier: ot with fat lay Quantity: 1 er exposed Physician Procedures CPT4: Description Modifier Quantity Code [id number] Debridement; bone (includes epidermis, dermis, subQ tissue, muscle 1 and/or fascia, if performed) 1st 20 sqcm or less ICD-10 Description Diagnosis E11.621 Type 2 diabetes mellitus with foot ulcer L97.522  Non-pressure chronic ulcer of other part of left foot with fat layer exposed Z92.21 Personal history of antineoplastic chemotherapy L89.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

## 2014-11-13 NOTE — Progress Notes (Signed)
Cheyenne Riggs, Cheyenne Riggs ([id number]) Visit Report for 11/13/2014 Arrival Information Details Patient Name: Cheyenne Riggs, Cheyenne Riggs. Date of Service: 11/13/2014 11:00 AM Medical Record Number: [id number] Patient Account Number: [id number] Date of Birth/Sex: [birth year] ([name]) Treating RN: Afful, RN, BSN, Velva Harman Primary Care Physician: Hulan Fess Other Clinician: Referring Physician: Nicholas Lose Treating Physician/Extender: Frann Rider in Treatment: 1 Visit Information History Since Last Visit Any new allergies or adverse reactions: No Patient Arrived: Cheyenne Riggs Had a fall or experienced change in No Arrival Time: 11:04 activities of daily living that may affect Accompanied By: self risk of falls: Transfer Assistance: None Signs or symptoms of abuse/neglect since last No Patient Identification Verified: Yes visito Secondary Verification Process Yes Has Dressing in Place as Prescribed: Yes Completed: Pain Present Now: No Patient Has Alerts: Yes Patient Alerts: Patient on Blood Thinner DMII eliquis Electronic Signature(s) Signed: 11/13/2014 2:48:51 PM By: Regan Lemming BSN, RN Entered By: Regan Lemming on 11/13/2014 11:04:38 Cheyenne Riggs ([id number]) -------------------------------------------------------------------------------- Encounter Discharge Information Details Patient Name: Cheyenne Riggs. Date of Service: 11/13/2014 11:00 AM Medical Record Number: [id number] Patient Account Number: [id number] Date of Birth/Sex: [birth year] ([name]) Treating RN: Baruch Gouty, RN, BSN, Velva Harman Primary Care Physician: Hulan Fess Other Clinician: Referring Physician: Nicholas Lose Treating Physician/Extender: Frann Rider in Treatment: 1 Encounter Discharge Information Items Discharge Pain Level: 0 Discharge Condition: Stable Ambulatory Status: Walker Discharge Destination: Home Private Transportation: Auto Accompanied By: self Schedule Follow-up Appointment: No Medication  Reconciliation completed and No provided to Patient/Care Felicidad Sugarman: Clinical Summary of Care: Electronic Signature(s) Signed: 11/13/2014 2:48:51 PM By: Regan Lemming BSN, RN Entered By: Regan Lemming on 11/13/2014 13:39:23 Cheyenne Riggs ([id number]) -------------------------------------------------------------------------------- Lower Extremity Assessment Details Patient Name: Cheyenne Riggs. Date of Service: 11/13/2014 11:00 AM Medical Record Number: [id number] Patient Account Number: [id number] Date of Birth/Sex: 1945/12/13 ([name]) Treating RN: Afful, RN, BSN, Velva Harman Primary Care Physician: Hulan Fess Other Clinician: Referring Physician: Nicholas Lose Treating Physician/Extender: Frann Rider in Treatment: 1 Edema Assessment Assessed: [Left: No] [Right: No] E[Left: dema] [Right: :] Calf Left: Right: Point of Measurement: 31 cm From Medial Instep cm cm Ankle Left: Right: Point of Measurement: 10 cm From Medial Instep cm cm Vascular Assessment Pulses: Posterior Tibial Dorsalis Pedis Palpable: [Left:Yes] Extremity colors, hair growth, and conditions: Extremity Color: [Left:Normal] Hair Growth on Extremity: [Left:Yes] Temperature of Extremity: [Left:Warm] Capillary Refill: [Left:< 3 seconds] Dependent Rubor: [Left:No] Blanched when Elevated: [Left:No] Lipodermatosclerosis: [Left:No] Toe Nail Assessment Left: Right: Thick: Yes Discolored: Yes Deformed: No Improper Length and Hygiene: No Electronic Signature(s) Signed: 11/13/2014 2:48:51 PM By: Regan Lemming BSN, RN Cheyenne Riggs, Cheyenne Riggs ([id number]) Entered By: Regan Lemming on 11/13/2014 11:08:32 Cheyenne Riggs ([id number]) -------------------------------------------------------------------------------- Multi Wound Chart Details Patient Name: Cheyenne Riggs. Date of Service: 11/13/2014 11:00 AM Medical Record Number: [id number] Patient Account Number: [id number] Date of Birth/Sex: Oct 06, 1945 ([name]) Treating RN:  Baruch Gouty, RN, BSN, Velva Harman Primary Care Physician: Hulan Fess Other Clinician: Referring Physician: Nicholas Lose Treating Physician/Extender: Frann Rider in Treatment: 1 Vital Signs Height(in): 63 Pulse(bpm): 66 Weight(lbs): 157 Blood Pressure 142/78 (mmHg): Body Mass Index(BMI): 28 Temperature(F): 97.9 Respiratory Rate 16 (breaths/min): Photos: [1:No Photos] [2:No Photos] [N/A:N/A] Wound Location: [1:Right Calcaneous] [2:Right Toe Great] [N/A:N/A] Wounding Event: [1:Pressure Injury] [2:Gradually Appeared] [N/A:N/A] Primary Etiology: [1:Pressure Ulcer] [2:Diabetic Wound/Ulcer of N/A the Lower Extremity] Comorbid History: [1:Arrhythmia, Congestive Arrhythmia, Congestive N/A Heart Failure, Hypertension, Peripheral Hypertension, Peripheral Venous Disease, Type II Venous Disease, Type II Diabetes, Neuropathy, Received Chemotherapy Received  Chemotherapy]  [2:Heart Failure, Diabetes, Neuropathy,] Date Acquired: [1:08/18/2014] [2:08/18/2014] [N/A:N/A] Weeks of Treatment: [1:1] [2:1] [N/A:N/A] Wound Status: [1:Open] [2:Open] [N/A:N/A] Measurements L x W x D 0.5x0.5x0.1 [2:2.4x2x0.3] [N/A:N/A] (cm) Area (cm) : [1:0.196] [2:3.77] [N/A:N/A] Volume (cm) : [1:0.02] [2:1.131] [N/A:N/A] % Reduction in Area: [1:10.90%] [2:-12.90%] [N/A:N/A] % Reduction in Volume: 9.10% [2:-13.00%] [N/A:N/A] Classification: [1:Category/Stage II] [2:Unable to visualize wound N/A bed] HBO Classification: [1:Grade 1] [2:N/A] [N/A:N/A] Exudate Amount: [1:Small] [2:Small] [N/A:N/A] Exudate Type: [1:Serous] [2:Serous] [N/A:N/A] Exudate Color: [1:amber] [2:amber] [N/A:N/A] Wound Margin: [1:Flat and Intact] [2:Flat and Intact] [N/A:N/A] Granulation Amount: [1:Small (1-33%)] [2:None Present (0%)] [N/A:N/A] Necrotic Amount: [1:Large (67-100%)] [2:Large (67-100%)] [N/A:N/A] Necrotic Tissue: [1:Eschar] [2:Eschar] [N/A:N/A] Exposed Structures: Fascia: No Fascia: No N/A Fat: No Fat: No Tendon: No Tendon:  No Muscle: No Muscle: No Joint: No Joint: No Bone: No Bone: No Limited to Skin Limited to Skin Breakdown Breakdown Epithelialization: None None N/A Periwound Skin Texture: Edema: No Edema: No N/A Excoriation: No Excoriation: No Induration: No Induration: No Callus: No Callus: No Crepitus: No Crepitus: No Fluctuance: No Fluctuance: No Friable: No Friable: No Rash: No Rash: No Scarring: No Scarring: No Periwound Skin Dry/Scaly: Yes Moist: Yes N/A Moisture: Maceration: No Maceration: No Moist: No Dry/Scaly: No Periwound Skin Color: Atrophie Blanche: No Atrophie Blanche: No N/A Cyanosis: No Cyanosis: No Ecchymosis: No Ecchymosis: No Erythema: No Erythema: No Hemosiderin Staining: No Hemosiderin Staining: No Mottled: No Mottled: No Pallor: No Pallor: No Rubor: No Rubor: No Temperature: No Abnormality No Abnormality N/A Tenderness on No No N/A Palpation: Wound Preparation: Ulcer Cleansing: Ulcer Cleansing: N/A Rinsed/Irrigated with Rinsed/Irrigated with Saline Saline Topical Anesthetic Topical Anesthetic Applied: Other: lidocaine Applied: Other: lidocaine 4% 4% Treatment Notes Electronic Signature(s) Signed: 11/13/2014 2:48:51 PM By: Regan Lemming BSN, RN Entered By: Regan Lemming on 11/13/2014 11:13:08 Cheyenne Riggs ([id number]) -------------------------------------------------------------------------------- Multi-Disciplinary Care Plan Details Patient Name: Cheyenne Riggs. Date of Service: 11/13/2014 11:00 AM Medical Record Number: [id number] Patient Account Number: [id number] Date of Birth/Sex: July 12, 1945 (68 y.o. Female) Treating RN: Afful, RN, BSN, Velva Harman Primary Care Physician: Hulan Fess Other Clinician: Referring Physician: Nicholas Lose Treating Physician/Extender: Frann Rider in Treatment: 1 Active Inactive Abuse / Safety / Falls / Self Care Management Nursing Diagnoses: Potential for falls Goals: Patient will remain injury  free Date Initiated: 11/06/2014 Goal Status: Active Interventions: Assess fall risk on admission and as needed Notes: Necrotic Tissue Nursing Diagnoses: Impaired tissue integrity related to necrotic/devitalized tissue Goals: Patient/caregiver will verbalize understanding of reason and process for debridement of necrotic tissue Date Initiated: 11/06/2014 Goal Status: Active Interventions: Provide education on necrotic tissue and debridement process Notes: Nutrition Nursing Diagnoses: Potential for alteratiion in Nutrition/Potential for imbalanced nutrition Goals: Patient/caregiver agrees to and verbalizes understanding of need to use nutritional supplements and/or vitamins as prescribed Cheyenne Riggs, Cheyenne Riggs ([id number]) Date Initiated: 11/06/2014 Goal Status: Active Interventions: Assess patient nutrition upon admission and as needed per policy Notes: Orientation to the Wound Care Program Nursing Diagnoses: Knowledge deficit related to the wound healing center program Goals: Patient/caregiver will verbalize understanding of the [place] Date Initiated: 11/06/2014 Goal Status: Active Interventions: Provide education on orientation to the wound center Notes: Wound/Skin Impairment Nursing Diagnoses: Impaired tissue integrity Goals: Ulcer/skin breakdown will have a volume reduction of 30% by week 4 Date Initiated: 11/06/2014 Goal Status: Active Interventions: Assess ulceration(s) every visit Notes: Electronic Signature(s) Signed: 11/13/2014 2:48:51 PM By: Regan Lemming BSN, RN Entered By: Regan Lemming on 11/13/2014 11:12:46 Cheyenne Riggs ([id number]) -------------------------------------------------------------------------------- Pain  Assessment Details Patient Name: Cheyenne Riggs, Cheyenne Riggs. Date of Service: 11/13/2014 11:00 AM Medical Record Number: [id number] Patient Account Number: [id number] Date of Birth/Sex: [birth year] (68 y.o. Female) Treating RN: Baruch Gouty, RN, BSN,  Velva Harman Primary Care Physician: Hulan Fess Other Clinician: Referring Physician: Nicholas Lose Treating Physician/Extender: Frann Rider in Treatment: 1 Active Problems Location of Pain Severity and Description of Pain Patient Has Paino No Site Locations Pain Management and Medication Current Pain Management: Electronic Signature(s) Signed: 11/13/2014 2:48:51 PM By: Regan Lemming BSN, RN Entered By: Regan Lemming on 11/13/2014 11:07:18 Cheyenne Riggs ([id number]) -------------------------------------------------------------------------------- Patient/Caregiver Education Details Patient Name: Cheyenne Riggs. Date of Service: 11/13/2014 11:00 AM Medical Record Number: [id number] Patient Account Number: [id number] Date of Birth/Gender: 1945/10/25 (68 y.o. Female) Treating RN: Baruch Gouty, RN, BSN, Velva Harman Primary Care Physician: Hulan Fess Other Clinician: Referring Physician: Nicholas Lose Treating Physician/Extender: Frann Rider in Treatment: 1 Education Assessment Education Provided To: Patient Education Topics Provided Welcome To [facility] [place]: Methods: Explain/Verbal Responses: Return demonstration correctly Wound Debridement: Methods: Explain/Verbal Responses: State content correctly Electronic Signature(s) Signed: 11/13/2014 2:48:51 PM By: Regan Lemming BSN, RN Entered By: Regan Lemming on 11/13/2014 13:39:38 Cheyenne Riggs ([id number]) -------------------------------------------------------------------------------- Wound Assessment Details Patient Name: Cheyenne Riggs. Date of Service: 11/13/2014 11:00 AM Medical Record Number: [id number] Patient Account Number: [id number] Date of Birth/Sex: 1945/09/21 (68 y.o. Female) Treating RN: Afful, RN, BSN, Velva Harman Primary Care Physician: Hulan Fess Other Clinician: Referring Physician: Nicholas Lose Treating Physician/Extender: Frann Rider in Treatment: 1 Wound Status Wound Number: 1 Primary Pressure  Ulcer Etiology: Wound Location: Right Calcaneous Wound Open Wounding Event: Pressure Injury Status: Date Acquired: 08/18/2014 Comorbid Arrhythmia, Congestive Heart Failure, Weeks Of Treatment: 1 History: Hypertension, Peripheral Venous Clustered Wound: No Disease, Type II Diabetes, Neuropathy, Received Chemotherapy Wound Measurements Length: (cm) 0.5 Width: (cm) 0.5 Depth: (cm) 0.1 Area: (cm) 0.196 Volume: (cm) 0.02 % Reduction in Area: 10.9% % Reduction in Volume: 9.1% Epithelialization: None Tunneling: No Undermining: No Wound Description Classification: Category/Stage II Foul Odor A Diabetic Severity ([place]): Grade 1 Wound Margin: Flat and Intact Exudate Amount: Small Exudate Type: Serous Exudate Color: amber fter Cleansing: No Wound Bed Granulation Amount: Small (1-33%) Exposed Structure Necrotic Amount: Large (67-100%) Fascia Exposed: No Necrotic Quality: Eschar Fat Layer Exposed: No Tendon Exposed: No Muscle Exposed: No Joint Exposed: No Bone Exposed: No Limited to Skin Breakdown Periwound Skin Texture Texture Color No Abnormalities Noted: No No Abnormalities Noted: No Callus: No Atrophie Blanche: No Cheyenne Riggs, Cheyenne Riggs. ([id number]) Crepitus: No Cyanosis: No Excoriation: No Ecchymosis: No Fluctuance: No Erythema: No Friable: No Hemosiderin Staining: No Induration: No Mottled: No Localized Edema: No Pallor: No Rash: No Rubor: No Scarring: No Temperature / Pain Moisture Temperature: No Abnormality No Abnormalities Noted: No Dry / Scaly: Yes Maceration: No Moist: No Wound Preparation Ulcer Cleansing: Rinsed/Irrigated with Saline Topical Anesthetic Applied: Other: lidocaine 4%, Treatment Notes Wound #1 (Right Calcaneous) 1. Cleansed with: Clean wound with Normal Saline 4. Dressing Applied: Prisma Ag 5. Secondary Dressing Applied Bordered Foam Dressing Electronic Signature(s) Signed: 11/13/2014 2:48:51 PM By: Regan Lemming BSN, RN Entered  By: Regan Lemming on 11/13/2014 11:11:14 Cheyenne Riggs ([id number]) -------------------------------------------------------------------------------- Wound Assessment Details Patient Name: Cheyenne Riggs. Date of Service: 11/13/2014 11:00 AM Medical Record Number: [id number] Patient Account Number: [id number] Date of Birth/Sex: 12-Jul-1945 (68 y.o. Female) Treating RN: Afful, RN, BSN, Velva Harman Primary Care Physician: Hulan Fess Other Clinician: Referring Physician: Nicholas Lose Treating Physician/Extender: Frann Rider in Treatment: 1 Wound Status  Wound Number: 2 Primary Diabetic Wound/Ulcer of the Lower Etiology: Extremity Wound Location: Right Toe Great Wound Open Wounding Event: Gradually Appeared Status: Date Acquired: 08/18/2014 Comorbid Arrhythmia, Congestive Heart Failure, Weeks Of Treatment: 1 History: Hypertension, Peripheral Venous Clustered Wound: No Disease, Type II Diabetes, Neuropathy, Received Chemotherapy Wound Measurements Length: (cm) 2.4 % Reduction in Width: (cm) 2 % Reduction in Depth: (cm) 0.3 Epithelializati Area: (cm) 3.77 Tunneling: Volume: (cm) 1.131 Undermining: Area: -12.9% Volume: -13% on: None No No Wound Description Classification: Unable to visualize wound bed Wound Margin: Flat and Intact Exudate Amount: Small Exudate Type: Serous Exudate Color: amber Foul Odor After Cleansing: No Wound Bed Granulation Amount: None Present (0%) Exposed Structure Necrotic Amount: Large (67-100%) Fascia Exposed: No Necrotic Quality: Eschar Fat Layer Exposed: No Tendon Exposed: No Muscle Exposed: No Joint Exposed: No Bone Exposed: No Limited to Skin Breakdown Periwound Skin Texture Texture Color No Abnormalities Noted: No No Abnormalities Noted: No Callus: No Atrophie Blanche: No Crepitus: No Cyanosis: No Cheyenne Riggs, Cheyenne Riggs. ([id number]) Excoriation: No Ecchymosis: No Fluctuance: No Erythema: No Friable: No Hemosiderin Staining:  No Induration: No Mottled: No Localized Edema: No Pallor: No Rash: No Rubor: No Scarring: No Temperature / Pain Moisture Temperature: No Abnormality No Abnormalities Noted: No Dry / Scaly: No Maceration: No Moist: Yes Wound Preparation Ulcer Cleansing: Rinsed/Irrigated with Saline Topical Anesthetic Applied: Other: lidocaine 4%, Treatment Notes Wound #2 (Right Toe Great) 4. Dressing Applied: Santyl Ointment 5. Secondary Dressing Applied Gauze and Kerlix/Conform 7. Secured with Recruitment consultant) Signed: 11/13/2014 2:48:51 PM By: Regan Lemming BSN, RN Entered By: Regan Lemming on 11/13/2014 11:12:37 Cheyenne Riggs ([id number]) -------------------------------------------------------------------------------- Vitals Details Patient Name: Cheyenne Riggs. Date of Service: 11/13/2014 11:00 AM Medical Record Number: [id number] Patient Account Number: [id number] Date of Birth/Sex: 1945-06-28 (68 y.o. Female) Treating RN: Afful, RN, BSN, Velva Harman Primary Care Physician: Hulan Fess Other Clinician: Referring Physician: Nicholas Lose Treating Physician/Extender: Frann Rider in Treatment: 1 Vital Signs Time Taken: 11:07 Temperature (F): 97.9 Height (in): 63 Pulse (bpm): 66 Weight (lbs): 157 Respiratory Rate (breaths/min): 16 Body Mass Index (BMI): 27.8 Blood Pressure (mmHg): 142/78 Reference Range: 80 - 120 mg / dl Electronic Signature(s) Signed: 11/13/2014 2:48:51 PM By: Regan Lemming BSN, RN Entered By: Regan Lemming on 11/13/2014 11:07:51

## 2014-11-17 ENCOUNTER — Ambulatory Visit: Payer: Medicare Other | Admitting: *Deleted

## 2014-11-17 ENCOUNTER — Other Ambulatory Visit: Payer: Self-pay

## 2014-11-17 LAB — SURGICAL PATHOLOGY

## 2014-11-17 NOTE — Patient Outreach (Signed)
[place] [facility]) Care Management  11/17/2014  Cheyenne Riggs [birth year] [id number]   Assessment: Call to follow up transition of care. Member reports that [place] is seeing her at this time and request call back.  Plan: follow up call in 1-2 days.

## 2014-11-18 ENCOUNTER — Other Ambulatory Visit: Payer: Self-pay | Admitting: *Deleted

## 2014-11-18 NOTE — Patient Outreach (Signed)
[place] [facility]) Care Management  11/18/2014  Cheyenne Riggs 1945/06/27 [id number]   Assessment: Follow-up transition of care call Called patient on both her cell phone and home telephone number to follow-up, but no answer. HIPPA compliant voice message left. Care management coordinator's contact number left.   Plan: Await patient's return call. Will call to follow patient on 7/8 if no return call.   Kalyn Dimattia A. Tykee Heideman, BSN, RN-BC [place] Coordinator Cell: [phone]

## 2014-11-19 ENCOUNTER — Telehealth: Payer: Self-pay | Admitting: Cardiovascular Disease

## 2014-11-19 ENCOUNTER — Other Ambulatory Visit: Payer: Self-pay | Admitting: *Deleted

## 2014-11-19 NOTE — Patient Outreach (Signed)
[place] [facility]) Care Management  11/19/2014  Cheyenne Riggs 13-Nov-1945 [id number]   Phone call to pateint to schedule home visit to complete transportation application for SCAT.  Per patient, she has declined need for SCAT as she has decided to discontinue her chemotherapy treatments and no longer has transportation needs.  Patient's case be closed to social work.  Patient encouraged to contact this CSW if further needs arise.  Contact information provided.   Sheralyn Boatman [facility] Care Management [phone]

## 2014-11-19 NOTE — Telephone Encounter (Signed)
Spoke with patient who states feet remain tight feeling from ankles to toes and she is very uncomfortable.  She reports taking the Lasix as directed and lost 7 lbs but got no relief from tightness; states feet do not appear swollen.  She reports home health nurse and PT have been pleased with patient's BP and she is feeling well other than the bilateral foot discomfort.  States the compression stockings are very difficult to get on; had PT put them on for her yesterday and did not notice any change in the tightness of her skin.  She reports using the lounge doctor wedge to elevate the feet above the level of the heart and reports no change from this activity.  I advised her that it does not sound cardiac related and we discussed other reasons for the skin tightness, including it is a reported side effect of Neurontin.  Patient states she stopped Neurontin 4 days ago - will continue to monitor.  She states she also recently stopped chemotherapy and wonders if the tightness is related to chemo.  I advised her to discuss with her other doctors (she states she has appointment at [facility] center tomorrow) and to continue to monitor.  I advised her to call back if swelling occurs or if she develops SOB or has questions or concerns.  Patient verbalized understanding and agreement with plan.

## 2014-11-19 NOTE — Telephone Encounter (Signed)
F/up  Ptm returned call

## 2014-11-19 NOTE — Telephone Encounter (Signed)
Agree with the note from Christen Bame, RN

## 2014-11-20 ENCOUNTER — Encounter: Payer: Medicare Other | Admitting: Surgery

## 2014-11-20 ENCOUNTER — Other Ambulatory Visit: Payer: Self-pay | Admitting: *Deleted

## 2014-11-20 DIAGNOSIS — Z87891 Personal history of nicotine dependence: Secondary | ICD-10-CM | POA: Diagnosis not present

## 2014-11-20 DIAGNOSIS — M86372 Chronic multifocal osteomyelitis, left ankle and foot: Secondary | ICD-10-CM | POA: Diagnosis not present

## 2014-11-20 DIAGNOSIS — L89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, stage 2: Secondary | ICD-10-CM | POA: Diagnosis not present

## 2014-11-20 DIAGNOSIS — L97522 Non-pressure chronic ulcer of other part of left foot with fat layer exposed: Secondary | ICD-10-CM | POA: Diagnosis not present

## 2014-11-20 DIAGNOSIS — I10 Essential (primary) hypertension: Secondary | ICD-10-CM | POA: Diagnosis not present

## 2014-11-20 DIAGNOSIS — E11621 Type 2 diabetes mellitus with foot ulcer: Secondary | ICD-10-CM | POA: Diagnosis not present

## 2014-11-20 DIAGNOSIS — M868X7 Other osteomyelitis, ankle and foot: Secondary | ICD-10-CM | POA: Diagnosis not present

## 2014-11-20 DIAGNOSIS — Z9221 Personal history of antineoplastic chemotherapy: Secondary | ICD-10-CM | POA: Diagnosis not present

## 2014-11-20 NOTE — Progress Notes (Unsigned)
This encounter was created in error - please disregard.

## 2014-11-20 NOTE — Progress Notes (Addendum)
DEVERY, ODWYER ([id number]) Visit Report for 11/20/2014 Chief Complaint Document Details Patient Name: Cheyenne Riggs, Cheyenne Riggs. Date of Service: 11/20/2014 3:00 PM Medical Record Number: [id number] Patient Account Number: [id number] Date of Birth/Sex: 1945-10-19 (68 y.o. Female) Treating RN: Primary Care Physician: Hulan Fess Other Clinician: Referring Physician: Hulan Fess Treating Physician/Extender: Frann Rider in Treatment: 2 Information Obtained from: Patient Chief Complaint Patient presents to the wound care center for a consult due non healing wound. extremities-year-old patient who is coming with a ulcer on the left heel and left big toe for about 2-1/2 months. Electronic Signature(s) Signed: 11/20/2014 3:51:09 PM By: Christin Fudge MD, FACS Entered By: Christin Fudge on 11/20/2014 15:51:09 Cheyenne Riggs ([id number]) -------------------------------------------------------------------------------- Debridement Details Patient Name: Cheyenne Riggs. Date of Service: 11/20/2014 3:00 PM Medical Record Number: [id number] Patient Account Number: [id number] Date of Birth/Sex: 04-13-1946 (68 y.o. Female) Treating RN: Primary Care Physician: Hulan Fess Other Clinician: Referring Physician: Hulan Fess Treating Physician/Extender: Frann Rider in Treatment: 2 Debridement Performed for Wound #1 Left Calcaneous Assessment: Performed By: Physician Pat Patrick., MD Debridement: Open Wound/Selective Debridement Selective Description: Pre-procedure Yes Verification/Time Out Taken: Start Time: 15:35 Pain Control: Lidocaine 4% Topical Solution Level: Non-Viable Tissue Total Area Debrided (L x 0.2 (cm) x 0.2 (cm) = 0.04 (cm) W): Tissue and other Non-Viable, Eschar, Exudate material debrided: Instrument: Forceps Bleeding: None End Time: 15:36 Procedural Pain: 0 Post Procedural Pain: 0 Response to Treatment: Procedure was tolerated well Post Debridement Measurements of  Total Wound Length: (cm) 0.2 Stage: Category/Stage II Width: (cm) 0.2 Depth: (cm) 0.1 Volume: (cm) 0.003 Electronic Signature(s) Signed: 11/20/2014 3:58:34 PM By: Christin Fudge MD, FACS Previous Signature: 11/20/2014 3:50:23 PM Version By: Christin Fudge MD, FACS Entered By: Christin Fudge on 11/20/2014 15:58:33 Cheyenne Riggs ([id number]) -------------------------------------------------------------------------------- Debridement Details Patient Name: Cheyenne Riggs. Date of Service: 11/20/2014 3:00 PM Medical Record Number: [id number] Patient Account Number: [id number] Date of Birth/Sex: 13-Sep-1945 (68 y.o. Female) Treating RN: Primary Care Physician: Hulan Fess Other Clinician: Referring Physician: Hulan Fess Treating Physician/Extender: Frann Rider in Treatment: 2 Debridement Performed for Wound #2 Left Toe Great Assessment: Performed By: Physician Pat Patrick., MD Debridement: Debridement Pre-procedure Yes Verification/Time Out Taken: Start Time: 15:33 Pain Control: Lidocaine 4% Topical Solution Level: Skin/Subcutaneous Tissue Total Area Debrided (L x 2.2 (cm) x 1.4 (cm) = 3.08 (cm) W): Tissue and other Viable, Non-Viable, Eschar, Fibrin/Slough, Subcutaneous material debrided: Instrument: Forceps Bleeding: None Hemostasis Achieved: Pressure End Time: 15:35 Procedural Pain: 0 Post Procedural Pain: 0 Response to Treatment: Procedure was tolerated well Post Debridement Measurements of Total Wound Length: (cm) 2.2 Width: (cm) 1.4 Depth: (cm) 0.3 Volume: (cm) 0.726 Electronic Signature(s) Signed: 11/20/2014 3:58:46 PM By: Christin Fudge MD, FACS Previous Signature: 11/20/2014 3:51:02 PM Version By: Christin Fudge MD, FACS Entered By: Christin Fudge on 11/20/2014 15:58:46 Cheyenne Riggs ([id number]) -------------------------------------------------------------------------------- HPI Details Patient Name: Cheyenne Riggs. Date of Service: 11/20/2014 3:00  PM Medical Record Number: [id number] Patient Account Number: [id number] Date of Birth/Sex: 1945-09-08 (68 y.o. Female) Treating RN: Primary Care Physician: Hulan Fess Other Clinician: Referring Physician: Hulan Fess Treating Physician/Extender: Frann Rider in Treatment: 2 History of Present Illness Location: left big toe and left heel Quality: Patient reports No Pain. Severity: Patient states wound (s) are getting better. Duration: Patient has had the wound for > 3 months prior to seeking treatment at the wound center Context: The wound occurred when the patient was admitted to hospital with a kidney failure. Modifying  Factors: Consults to this date include:vascular opinion which was negative for any embolic phenomena or vascular problems. Associated Signs and Symptoms: Patient reports having difficulty standing for long periods. HPI Description: 69 year old patient who is known to have essential hypertension, type 2 diabetes mellitus, chronic kidney disease, trigger tobacco dependence now reformed smoker, recent problems with kidney failure. During her hospitalization recently she developed hypotension tachycardia and several other complications and during that time it was noted that all her toes and fingertips were mottled and she was seen by by vascular surgery. incidental Dr. Scot Dock has done a study and was told essentially that she did not require any angiographic surgery or angioplasty. She has recovered from acute kidney failure with tubular necrosis, possible myocardial infarction, thrombocytopenia and has now been also diagnosed with breast cancer and she's been to take chemotherapy and radiation. Addendum: 11/13/2014 -- Xray of the left foot was done today -- IMPRESSION: Focal osteomyelitis of the tuft of the distal phalanx of the great toe. 11/20/2014 -- X-ray of the left great toe revealed focal osteomyelitis of the tuft of the distal phalanx of the great  toe. Surgical pathology of the bone revealed fragments of bone with osteomyelitis. Electronic Signature(s) Signed: 11/20/2014 3:51:41 PM By: Christin Fudge MD, FACS Previous Signature: 11/20/2014 11:44:19 AM Version By: Christin Fudge MD, FACS Entered By: Christin Fudge on 11/20/2014 15:51:41 Cheyenne Riggs ([id number]) -------------------------------------------------------------------------------- Physical Exam Details Patient Name: Cheyenne Riggs. Date of Service: 11/20/2014 3:00 PM Medical Record Number: [id number] Patient Account Number: [id number] Date of Birth/Sex: 11-26-1945 (68 y.o. Female) Treating RN: Primary Care Physician: Hulan Fess Other Clinician: Referring Physician: Hulan Fess Treating Physician/Extender: Frann Rider in Treatment: 2 Constitutional . Pulse regular. Respirations normal and unlabored. Afebrile. . Eyes Nonicteric. Reactive to light. Ears, Nose, Mouth, and Throat Lips, teeth, and gums WNL.Marland Kitchen Moist mucosa without lesions . Neck supple and nontender. No palpable supraclavicular or cervical adenopathy. Normal sized without goiter. Respiratory WNL. No retractions.. Cardiovascular Pedal Pulses WNL. No clubbing, cyanosis or edema. Chest Breasts symmetical and no nipple discharge.. Breast tissue WNL, no masses, lumps, or tenderness.. Musculoskeletal Adexa without tenderness or enlargement.. Digits and nails w/o clubbing, cyanosis, infection, petechiae, ischemia, or inflammatory conditions.. Integumentary (Hair, Skin) No suspicious lesions. No crepitus or fluctuance. No peri-wound warmth or erythema. No masses.Marland Kitchen Psychiatric Judgement and insight Intact.. No evidence of depression, anxiety, or agitation.. Notes The left heel looks very good and there is a minimal open area and rest of it is at the lysed. As far as the left big toe goes she still probes down to bone and has some necrotic debris which have been able to sharply removed. Electronic  Signature(s) Signed: 11/20/2014 3:52:23 PM By: Christin Fudge MD, FACS Entered By: Christin Fudge on 11/20/2014 15:52:23 Cheyenne Riggs ([id number]) -------------------------------------------------------------------------------- Physician Orders Details Patient Name: Cheyenne Riggs. Date of Service: 11/20/2014 3:00 PM Medical Record Number: [id number] Patient Account Number: [id number] Date of Birth/Sex: Oct 13, 1945 (68 y.o. Female) Treating RN: Montey Hora Primary Care Physician: Hulan Fess Other Clinician: Referring Physician: Hulan Fess Treating Physician/Extender: Frann Rider in Treatment: 2 Verbal / Phone Orders: Yes Clinician: Montey Hora Read Back and Verified: Yes Diagnosis Coding Wound Cleansing Wound #1 Right Calcaneous o Clean wound with Normal Saline. Wound #2 Right Toe Great o Clean wound with Normal Saline. Anesthetic Wound #1 Right Calcaneous o Topical Lidocaine 4% cream applied to wound bed prior to debridement Wound #2 Right Toe Great o Topical Lidocaine 4% cream applied to wound  bed prior to debridement Skin Barriers/Peri-Wound Care Wound #1 Right Calcaneous o Skin Prep Wound #2 Right Toe Great o Skin Prep Primary Wound Dressing Wound #1 Right Calcaneous o Prisma Ag Wound #2 Right Toe Great o Santyl Ointment Secondary Dressing Wound #1 Right Calcaneous o Boardered Foam Dressing Wound #2 Right Toe Great o Conform/Kerlix Dressing Change Frequency Cheyenne Riggs, Cheyenne Riggs. ([id number]) Wound #1 Right Calcaneous o Change dressing every other day. Wound #2 Right Toe Great o Change dressing every day. Follow-up Appointments Wound #1 Right Calcaneous o Return Appointment in 1 week. Wound #2 Right Toe Great o Return Appointment in 1 week. Home Health Wound #1 Right [place] Visits - [facility] Nurse may visit PRN to address patientos wound care needs. o FACE TO FACE ENCOUNTER:  MEDICARE and MEDICAID PATIENTS: I certify that this patient is under my care and that I had a face-to-face encounter that meets the physician face-to-face encounter requirements with this patient on this date. The encounter with the patient was in whole or in part for the following MEDICAL CONDITION: (primary reason for [place]) MEDICAL NECESSITY: I certify, that based on my findings, NURSING services are a medically necessary home health service. HOME BOUND STATUS: I certify that my clinical findings support that this patient is homebound (i.e., Due to illness or injury, pt requires aid of supportive devices such as crutches, cane, wheelchairs, walkers, the use of special transportation or the assistance of another person to leave their place of residence. There is a normal inability to leave the home and doing so requires considerable and taxing effort. Other absences are for medical reasons / religious services and are infrequent or of short duration when for other reasons). o If current dressing causes regression in wound condition, may D/C ordered dressing product/s and apply Normal Saline Moist Dressing Cheyenne Riggs until next [place] / Other MD appointment. [place] of regression in wound condition at [phone]. o Please direct any NON-WOUND related issues/requests for orders to patient's Primary Care Physician Wound #2 Right Toe [place] Visits - [place] Nurse may visit PRN to address patientos wound care needs. o FACE TO FACE ENCOUNTER: MEDICARE and MEDICAID PATIENTS: I certify that this patient is under my care and that I had a face-to-face encounter that meets the physician face-to-face encounter requirements with this patient on this date. The encounter with the patient was in whole or in part for the following MEDICAL CONDITION: (primary reason for [place]) MEDICAL NECESSITY: I certify, that  based on my findings, NURSING services are a medically necessary home health service. HOME BOUND STATUS: I certify that my clinical findings support that this patient is homebound (i.e., Due to illness or injury, pt requires aid of supportive devices such as crutches, cane, wheelchairs, walkers, the use of special transportation or the assistance of another person to leave their place of residence. There is a normal inability to leave the home and doing so requires considerable and taxing effort. Cheyenne Riggs, Cheyenne Riggs ([id number]) absences are for medical reasons / religious services and are infrequent or of short duration when for other reasons). o If current dressing causes regression in wound condition, may D/C ordered dressing product/s and apply Normal Saline Moist Dressing Cheyenne Riggs until next [place] / Other MD appointment. [place] of regression in wound condition at [phone]. o Please direct any NON-WOUND related issues/requests for orders to patient's Primary  Care Physician Medications-please add to medication list. Wound #2 Right Toe Great o Santyl Enzymatic Ointment Electronic Signature(s) Signed: 11/20/2014 4:43:54 PM By: Montey Hora Signed: 11/23/2014 12:34:21 PM By: Christin Fudge MD, FACS Entered By: Montey Hora on 11/20/2014 15:39:56 Cheyenne Riggs, Cheyenne Riggs ([id number]) -------------------------------------------------------------------------------- Prescription 11/20/2014 Patient Name: Cheyenne Riggs Physician: Christin Fudge MD Date of Birth: May 28, 1945 NPI#: [id number] Sex: F DEA#: [id number] Phone #: [phone] License #: Patient Address: [place], [place] [address] [facility] [address], [place], [place] [address] [phone] Allergies Demerol novacaine codeine Cipro Lipitor Vytorin lovastatin Septra NSAIDS (Non-Steroidal  Anti-Inflammatory Drug) Crestor Reaction: doses higher than 40mg  Physician's Orders Santyl Enzymatic Ointment Cheyenne Riggs, Cheyenne Riggs ([id number]) Signature(s): Date(s): Electronic Signature(s) Signed: 11/20/2014 4:43:54 PM By: Montey Hora Signed: 11/23/2014 12:34:21 PM By: Christin Fudge MD, FACS Entered By: Montey Hora on 11/20/2014 15:39:57 Cheyenne Riggs ([id number]) --------------------------------------------------------------------------------  Problem List Details Patient Name: Cheyenne Riggs. Date of Service: 11/20/2014 3:00 PM Medical Record Number: [id number] Patient Account Number: [id number] Date of Birth/Sex: 06/01/1945 (68 y.o. 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 Date of Service: 11/20/2014 3:00 PM Medical Record Number: [id number] Patient Account Number: [id number] Date of Birth/Sex: June 06, 1945 (68 y.o. Female) Treating RN: Primary Care Physician: Hulan Fess Other Clinician: Referring Physician: Hulan Fess Treating Physician/Extender: Frann Rider in Treatment:  2 Subjective Chief Complaint Information obtained from Patient Patient presents to the wound care center for a consult due non healing wound. extremities-year-old patient who is coming with a ulcer on the left heel and left big toe for about 2-1/2 months. History of Present Illness (HPI) The following HPI elements were documented for the patient's wound: Location: left big toe and left heel Quality: Patient reports No Pain. Severity: Patient states wound (s) are getting better. Duration: Patient has had the wound for > 3 months prior to seeking treatment at the wound center Context: The wound occurred when the patient was admitted to hospital with a kidney failure. Modifying Factors: Consults to this date include:vascular opinion which was negative for any embolic phenomena or vascular problems. Associated Signs and Symptoms: Patient reports having difficulty standing for long periods. 69 year old patient who is known to have essential hypertension, type 2 diabetes mellitus, chronic kidney disease, trigger tobacco dependence now reformed smoker, recent problems with kidney failure. During her hospitalization recently she developed hypotension tachycardia and several other complications and during that time it was noted that all her toes and fingertips were mottled and she was seen by by vascular surgery. incidental Dr. Scot Dock has done a study and was told essentially that she did not require any angiographic surgery or angioplasty. She has recovered from acute kidney failure with tubular necrosis, possible myocardial infarction, thrombocytopenia and has now been also diagnosed with breast cancer and she's been  to take chemotherapy and radiation. Addendum: 11/13/2014 -- Xray of the left foot was done today -- IMPRESSION: Focal osteomyelitis of the tuft of the distal phalanx of the great toe. 11/20/2014 -- X-ray of the left great toe revealed focal osteomyelitis of the tuft of the distal  phalanx of the great toe. Surgical pathology of the bone revealed fragments of bone with osteomyelitis. Cheyenne Riggs, Cheyenne Riggs ([id number]) Objective Constitutional Pulse regular. Respirations normal and unlabored. Afebrile. Eyes Nonicteric. Reactive to light. Ears, Nose, Mouth, and Throat Lips, teeth, and gums WNL.Marland Kitchen Moist mucosa without lesions . Neck supple and nontender. No palpable supraclavicular or cervical adenopathy. Normal sized without goiter. Respiratory WNL. No retractions.. Cardiovascular Pedal Pulses WNL. No clubbing, cyanosis or edema. Chest Breasts symmetical and no nipple discharge.. Breast tissue WNL, no masses, lumps, or tenderness.. Musculoskeletal Adexa without tenderness or enlargement.. Digits and nails w/o clubbing, cyanosis, infection, petechiae, ischemia, or inflammatory conditions.Marland Kitchen Psychiatric Judgement and insight Intact.. No evidence of depression, anxiety, or agitation.. General Notes: The left heel looks very good and there is a minimal open area and rest of it is at the lysed. As far as the left big toe goes she still probes down to bone and has some necrotic debris which have been able to sharply removed. Integumentary (Hair, Skin) No suspicious lesions. No crepitus or fluctuance. No peri-wound warmth or erythema. No masses.. Wound #1 status is Open. Original cause of wound was Pressure Injury. The wound is located on the Left Calcaneous. The wound measures 0.2cm length x 0.2cm width x 0.1cm depth; 0.031cm^2 area and 0.003cm^3 volume. The wound is limited to skin breakdown. There is no tunneling or undermining noted. There is a small amount of serous drainage noted. The wound margin is flat and intact. There is small (1-33%) granulation within the wound bed. There is a large (67-100%) amount of necrotic tissue within the wound bed including Eschar. The periwound skin appearance exhibited: Dry/Scaly. The periwound skin appearance did not exhibit: Callus,  Crepitus, Excoriation, Fluctuance, Friable, Induration, Localized Edema, Rash, Scarring, Maceration, Moist, Atrophie Blanche, Cyanosis, Ecchymosis, Hemosiderin Staining, Mottled, Pallor, Rubor, Erythema. Periwound temperature was noted as No Abnormality. Cheyenne Riggs, Cheyenne Riggs ([id number]) Wound #2 status is Open. Original cause of wound was Gradually Appeared. The wound is located on the Left Toe Great. The wound measures 2.2cm length x 1.4cm width x 0.3cm depth; 2.419cm^2 area and 0.726cm^3 volume. The wound is limited to skin breakdown. There is no tunneling or undermining noted. There is a small amount of serous drainage noted. The wound margin is flat and intact. There is medium (34-66%) red granulation within the wound bed. There is a medium (34-66%) amount of necrotic tissue within the wound bed including Adherent Slough. The periwound skin appearance exhibited: Moist. The periwound skin appearance did not exhibit: Callus, Crepitus, Excoriation, Fluctuance, Friable, Induration, Localized Edema, Rash, Scarring, Dry/Scaly, Maceration, Atrophie Blanche, Cyanosis, Ecchymosis, Hemosiderin Staining, Mottled, Pallor, Rubor, Erythema. Periwound temperature was noted as No Abnormality. Assessment Active Problems ICD-10 E11.621 - Type 2 diabetes mellitus with foot ulcer L97.522 - Non-pressure chronic ulcer of other part of left foot with fat layer exposed Z92.21 - Personal history of antineoplastic chemotherapy Z87.891 - Personal history of nicotine dependence L89.622 - Pressure ulcer of left heel, stage 2 In view of the patient's osteomyelitis of the tuft of distal phalanx on her left big toe I have recommended debridement by her surgeon Dr. Scot Dock, an ID consult and hyperbaric oxygen therapy. 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 In either case, I have discussed the  benefits of hyperbaric oxygen therapy and if she is willing to give it a try we could see if she makes a clinical improvement after operative debridement. I will put in a call to Dr. Scot Dock and once he is back from vacation we can discuss her management. I also asked her to see her cardiologist and get a echo, EKG and also a chest x-ray done in preparation for hyperbaric oxygen therapy. This could be done either at [facility] or at [place]. Wound care orders have been placed and she will see [place] back next week. Procedures Wound #1 Wound #1 is a Pressure Ulcer located on the Left Calcaneous . There was a Non-Viable Tissue Open Wound/Selective [id number]) debridement with total area of 0.04 sq cm performed by Bartley Vuolo, Jackson Latino., MD. with the following instrument(s): Forceps to remove Non-Viable tissue/material including Exudate and Eschar after achieving pain control using Lidocaine 4% Topical Solution. A time out was conducted prior to Cheyenne Riggs, Cheyenne Riggs. ([id number]) the start of the procedure. There was no bleeding. The procedure was tolerated well with a pain level of 0 throughout and a pain level of 0 following the procedure. Post Debridement Measurements: 0.2cm length x 0.2cm width x 0.1cm depth; 0.003cm^3 volume. Post debridement Stage noted as Category/Stage II. Wound #2 Wound #2 is a Diabetic Wound/Ulcer of the Lower Extremity located on the Left Toe Great . There was a Skin/Subcutaneous Tissue Debridement [id number]) debridement with total area of 3.08 sq cm performed by Reshanda Lewey, Jackson Latino., MD. with the following instrument(s): Forceps to remove Viable and Non- Viable tissue/material including Fibrin/Slough, Eschar, and Subcutaneous after achieving pain control using Lidocaine 4% Topical Solution. A time out was conducted prior to the start of the procedure. There was no bleeding. The procedure was tolerated well with a pain level of 0 throughout and a pain level of 0 following the  procedure. Post Debridement Measurements: 2.2cm length x 1.4cm width x 0.3cm depth; 0.726cm^3 volume. Plan Wound Cleansing: Wound #1 Right Calcaneous: Clean wound with Normal Saline. Wound #2 Right Toe Great: Clean wound with Normal Saline. Anesthetic: Wound #1 Right Calcaneous: Topical Lidocaine 4% cream applied to wound bed prior to debridement Wound #2 Right Toe Great: Topical Lidocaine 4% cream applied to wound bed prior to debridement Skin Barriers/Peri-Wound Care: Wound #1 Right Calcaneous: Skin Prep Wound #2 Right Toe Great: Skin Prep Primary Wound Dressing: Wound #1 Right Calcaneous: Prisma Ag Wound #2 Right Toe Great: Santyl Ointment Secondary Dressing: Wound #1 Right Calcaneous: Boardered Foam Dressing Wound #2 Right Toe Great: Conform/Kerlix Dressing Change Frequency: Wound #1 Right Calcaneous: Change dressing every other day. Wound #2 Right Toe Great: Change dressing every day. Cheyenne Riggs, Cheyenne Riggs ([id number]) Follow-up Appointments: Wound #1 Right Calcaneous: Return Appointment in 1 week. Wound #2 Right Toe Great: Return Appointment in 1 week. Home Health: Wound #1 Right [place]: Continue Home Health Visits - [facility] Nurse may visit PRN to address patient s wound care needs. FACE TO FACE ENCOUNTER: MEDICARE and MEDICAID PATIENTS: I certify that this patient is under my care and that I had a face-to-face encounter that meets the physician face-to-face encounter requirements with this patient on this date. The encounter with the patient was in whole or in part for the following MEDICAL CONDITION: (primary reason for [place])  MEDICAL NECESSITY: I certify, that based on my findings, NURSING services are a medically necessary home health service. HOME BOUND STATUS: I certify that my clinical findings support that this patient is homebound (i.e., Due to illness or injury, pt requires aid of supportive devices such as crutches, cane,  wheelchairs, walkers, the use of special transportation or the assistance of another person to leave their place of residence. There is a normal inability to leave the home and doing so requires considerable and taxing effort. Other absences are for medical reasons / religious services and are infrequent or of short duration when for other reasons). If current dressing causes regression in wound condition, may D/C ordered dressing product/s and apply Normal Saline Moist Dressing Cheyenne Riggs until next [place] / Other MD appointment. [place] of regression in wound condition at [phone]. Please direct any NON-WOUND related issues/requests for orders to patient's Primary Care Physician Wound #2 Right Toe Great: [place] Visits - [facility] Nurse may visit PRN to address patient s wound care needs. FACE TO FACE ENCOUNTER: MEDICARE and MEDICAID PATIENTS: I certify that this patient is under my care and that I had a face-to-face encounter that meets the physician face-to-face encounter requirements with this patient on this date. The encounter with the patient was in whole or in part for the following MEDICAL CONDITION: (primary reason for [place]) MEDICAL NECESSITY: I certify, that based on my findings, NURSING services are a medically necessary home health service. HOME BOUND STATUS: I certify that my clinical findings support that this patient is homebound (i.e., Due to illness or injury, pt requires aid of supportive devices such as crutches, cane, wheelchairs, walkers, the use of special transportation or the assistance of another person to leave their place of residence. There is a normal inability to leave the home and doing so requires considerable and taxing effort. Other absences are for medical reasons / religious services and are infrequent or of short duration when for other reasons). If current dressing causes regression in  wound condition, may D/C ordered dressing product/s and apply Normal Saline Moist Dressing Cheyenne Riggs until next [place] / Other MD appointment. [place] of regression in wound condition at [phone]. Please direct any NON-WOUND related issues/requests for orders to patient's Primary Care Physician Medications-please add to medication list.: Wound #2 Right Toe Great: Santyl Enzymatic Ointment Cheyenne Riggs, Cheyenne Riggs ([id number]) In view of the patient's osteomyelitis of the tuft of distal phalanx on her left big toe I have recommended debridement by her surgeon Dr. Scot Dock, an ID consult and hyperbaric oxygen therapy. 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 In either case, I have discussed the benefits of hyperbaric oxygen therapy and if she is willing to give it a try we could see if she makes a clinical improvement after operative debridement. I will put in a call to Dr. Scot Dock and once he is back from vacation we can discuss her management. I also asked her to see her cardiologist and get a echo, EKG and also a chest x-ray done in preparation for hyperbaric oxygen therapy. This could be done either at [facility] or at [facility]. Wound care orders have been placed and she will see [place] back next week. Electronic Signature(s) Signed: 11/23/2014 12:45:55 PM By: Christin Fudge MD,  FACS Previous Signature: 11/23/2014 12:45:46 PM Version By: Christin Fudge MD, FACS Previous Signature: 11/20/2014 3:56:11 PM Version By: Christin Fudge MD, FACS Entered By: Christin Fudge on 11/23/2014 12:45:55 Cheyenne Riggs ([id number]) -------------------------------------------------------------------------------- SuperBill Details Patient Name: Cheyenne Riggs. Date of Service: 11/20/2014 Medical Record Number: [id number] Patient Account Number: [id number] Date of  Birth/Sex: 06-09-1945 (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, left ankle and foot Facility Procedures CPT4 Code Description: [id number] 11042 - DEB SUBQ TISSUE 20 SQ CM/< ICD-10 Description Diagnosis E11.621 Type 2 diabetes mellitus with foot ulcer Z87.891 Personal history of nicotine dependence Z92.21 Personal history of antineoplastic chemotherapy  L89.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, stage 2 Modifier: Quantity: 1 CPT4 Code Description: [id number] 97597 - DEBRIDE WOUND 1ST 20 SQ CM OR < ICD-10 Description Diagnosis 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 wi Z92.21 Personal history of  antineoplastic chemotherapy L89.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, stage 2 Modifier: 59 th fat laye Quantity: 1 r exposed Physician Procedures CPT4 Code Description: [id number] 99213 - WC PHYS LEVEL 3 - EST PT ICD-10 Description Diagnosis 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

## 2014-11-20 NOTE — Patient Outreach (Signed)
[facility]) Care Management  11/20/2014  Cheyenne Riggs 08/11/1945 [id number]    Assessment: Follow-up transition of care Called and spoke to patient who states she is "doing fine". Reports she had stopped chemotherapy as per her recent visit with oncologist. Patient verbalized, " I don't need your services anymore because I stopped chemo". She reports informing [facility] social worker about it yesterday as well. Verified with patient that she fully understands [facility] services that we provide. Patient confirmed and stated, " you've been good people to have but it's too much services going on".  She reports seeing Dr. Doren Custard- cardiovascular surgeon, Dr. Cathie Olden- cardiologist, Dr. Con Memos- wound care center doctor. Home health physical therapy and nurse. She reports occupational therapy signed off on her since she "did really good".  She confirms checking weight daily and blood pressure and keeping record of it. Recent weight 7/7 = 155 pounds and blood pressure on 7/7 = 130/70. She reports taking her Lasix for 3 days per Cardiologist recent order. Denies any further swelling to feet nor shortness of breath at this time. She reports no worsening pain to feet and home health nurse assist her with dressing changes at home. She is able to manage transportation arrangements to go to her scheduled appointments and states she does not need to complete SCAT application since she's not going to continue with chemotherapy anymore. Cheyenne Riggs states she is maintaining her low salt diet, takes medications as ordered and attends scheduled appointments. She also verbalized awareness to call primary care provider when necessary. Encouraged patient to actively participate with home health services who are currently working with her.  Discussed with patient regarding telephonic care management or health coach who can assist her further with disease management, but patient states "not for now". Informed patient that she  can be referred back to [facility] care management if deemed necessary. Instructed patient to call [facility] care management coordinator if further needs arise and encouraged her to use 24 hour nurse line if needed.   Plan: Will close case. Will notify primary care provider and care management assistant of case closure.   Cheyenne Riggs A. Mike Berntsen, BSN, RN-BC [place] Coordinator Cell: [phone]

## 2014-11-21 NOTE — Progress Notes (Signed)
Cheyenne Riggs, Cheyenne Riggs ([id number]) Visit Report for 11/20/2014 Arrival Information Details Patient Name: Cheyenne Riggs, Cheyenne Riggs. Date of Service: 11/20/2014 3:00 PM Medical Record Number: [id number] Patient Account Number: [id number] Date of Birth/Sex: 1945-12-14 (68 y.o. Female) Treating RN: Montey Hora Primary Care Physician: Hulan Fess Other Clinician: Referring Physician: Hulan Fess Treating Physician/Extender: Frann Rider in Treatment: 2 Visit Information History Since Last Visit Added or deleted any medications: No Patient Arrived: Walker Any new allergies or adverse reactions: No Arrival Time: 15:08 Had a fall or experienced change in No Accompanied By: self activities of daily living that may affect Transfer Assistance: None risk of falls: Patient Identification Verified: Yes Signs or symptoms of abuse/neglect since last No Secondary Verification Process Yes visito Completed: Hospitalized since last visit: No Patient Has Alerts: Yes Pain Present Now: Yes Patient Alerts: Patient on Blood Thinner DMII eliquis Electronic Signature(s) Signed: 11/20/2014 4:43:54 PM By: Montey Hora Entered By: Montey Hora on 11/20/2014 15:09:14 Cheyenne Riggs ([id number]) -------------------------------------------------------------------------------- Encounter Discharge Information Details Patient Name: Cheyenne Riggs. Date of Service: 11/20/2014 3:00 PM Medical Record Number: [id number] Patient Account Number: [id number] Date of Birth/Sex: 22-Jul-1945 (68 y.o. Female) Treating RN: Montey Hora Primary Care Physician: Hulan Fess Other Clinician: Referring Physician: Hulan Fess Treating Physician/Extender: Frann Rider in Treatment: 2 Encounter Discharge Information Items Discharge Pain Level: 0 Discharge Condition: Stable Ambulatory Status: Walker Discharge Destination: Home Private Transportation: Auto Accompanied By: self Schedule Follow-up Appointment:  Yes Medication Reconciliation completed and No provided to Patient/Care Aline Wesche: Clinical Summary of Care: Electronic Signature(s) Signed: 11/20/2014 4:38:30 PM By: Montey Hora Entered By: Montey Hora on 11/20/2014 16:38:30 Cheyenne Riggs ([id number]) -------------------------------------------------------------------------------- General Visit Notes Details Patient Name: Cheyenne Riggs. Date of Service: 11/20/2014 3:00 PM Medical Record Number: [id number] Patient Account Number: [id number] Date of Birth/Sex: Mar 10, 1946 (68 y.o. Female) Treating RN: Montey Hora Primary Care Physician: Hulan Fess Other Clinician: Referring Physician: Hulan Fess Treating Physician/Extender: Frann Rider in Treatment: 2 Notes Dr Con Memos spoke with patient today about biopsy results. He also spoke with patient about her needing IV abx and HBO. Patient states that she cannot take IV abx or any abx r/t c diff. Electronic Signature(s) Signed: 11/20/2014 4:43:54 PM By: Montey Hora Entered By: Montey Hora on 11/20/2014 15:37:20 Cheyenne Riggs ([id number]) -------------------------------------------------------------------------------- Lower Extremity Assessment Details Patient Name: Cheyenne Riggs. Date of Service: 11/20/2014 3:00 PM Medical Record Number: [id number] Patient Account Number: [id number] Date of Birth/Sex: 29-Apr-1946 (68 y.o. Female) Treating RN: Montey Hora Primary Care Physician: Hulan Fess Other Clinician: Referring Physician: Hulan Fess Treating Physician/Extender: Frann Rider in Treatment: 2 Vascular Assessment Pulses: Posterior Tibial Palpable: [Left:Yes] Extremity colors, hair growth, and conditions: Extremity Color: [Left:Normal] Hair Growth on Extremity: [Left:Yes] Temperature of Extremity: [Left:Warm] Capillary Refill: [Left:< 3 seconds] Electronic Signature(s) Signed: 11/20/2014 4:43:54 PM By: Montey Hora Entered By: Montey Hora on  11/20/2014 15:10:44 Cheyenne Riggs ([id number]) -------------------------------------------------------------------------------- Multi Wound Chart Details Patient Name: Cheyenne Riggs. Date of Service: 11/20/2014 3:00 PM Medical Record Number: [id number] Patient Account Number: [id number] Date of Birth/Sex: 10-31-1945 (68 y.o. 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] Comorbid History: [1:Arrhythmia, Congestive Arrhythmia, Congestive N/A Heart Failure, Hypertension, Peripheral Hypertension, Peripheral Venous Disease, Type  II Venous Disease, Type II Diabetes, Neuropathy, Received Chemotherapy Received Chemotherapy]  [2:Heart Failure, Diabetes, Neuropathy,] Date Acquired: [1:08/18/2014] [2:08/18/2014] [N/A:N/A] Weeks of Treatment: [1:2] [2:2] [N/A:N/A] Wound Status: [1:Open] [2:Open] [N/A:N/A] Measurements L x W x D 0.6x0.6x0.1 [2:2.2x1.4x0.3] [N/A:N/A] (cm) Area (cm) : [1:0.283] [2:2.419] [N/A:N/A] Volume (cm) : [1:0.028] [2:0.726] [N/A:N/A] % Reduction in Area: [1:-28.60%] [2:27.50%] [N/A:N/A] % Reduction in Volume: -27.30% [2:27.50%] [N/A:N/A] Classification: [1:Category/Stage II] [2:Unable to visualize wound N/A bed] HBO Classification: [1:Grade 1] [2:N/A] [N/A:N/A] Exudate Amount: [1:Small] [2:Small] [N/A:N/A] Exudate Type: [1:Serous] [2:Serous] [N/A:N/A] Exudate Color: [1:amber] [2:amber] [N/A:N/A] Wound Margin: [1:Flat and Intact] [2:Flat and Intact] [N/A:N/A] Granulation Amount: [1:Small (1-33%)] [2:Medium (34-66%)] [N/A:N/A] Granulation Quality: [1:N/A] [2:Red]  [N/A:N/A] Necrotic Amount: [1:Large (67-100%)] [2:Medium (34-66%)] [N/A:N/A] Necrotic Tissue: [1:Eschar] [2:Adherent Slough] [N/A:N/A] Exposed Structures: [1:Fascia: No Fat: No Tendon: No Muscle: No Joint: No Bone: No] [2:Fascia: No Fat: No Tendon: No Muscle: No Joint: No Bone: No] [N/A:N/A] Limited to Skin Limited to Skin Breakdown Breakdown Epithelialization: None None N/A Periwound Skin Texture: Edema: No Edema: No N/A Excoriation: No Excoriation: No Induration: No Induration: No Callus: No Callus: No Crepitus: No Crepitus: No Fluctuance: No Fluctuance: No Friable: No Friable: No Rash: No Rash: No Scarring: No Scarring: No Periwound Skin Dry/Scaly: Yes Moist: Yes N/A Moisture: Maceration: No Maceration: No Moist: No Dry/Scaly: No Periwound Skin Color: Atrophie Blanche: No Atrophie Blanche: No N/A Cyanosis: No Cyanosis: No Ecchymosis: No Ecchymosis: No Erythema: No Erythema: No Hemosiderin Staining: No Hemosiderin Staining: No Mottled: No Mottled: No Pallor: No Pallor: No Rubor: No Rubor: No Temperature: No Abnormality No Abnormality N/A Tenderness on No No N/A Palpation: Wound Preparation: Ulcer Cleansing: Ulcer Cleansing: N/A Rinsed/Irrigated with Rinsed/Irrigated with Saline Saline Topical Anesthetic Topical Anesthetic Applied: Other: lidocaine Applied: Other: lidocaine 4% 4% Treatment Notes Electronic Signature(s) Signed: 11/20/2014 4:43:54 PM By: Montey Hora Entered By: Montey Hora on 11/20/2014 15:18:54 Cheyenne Riggs ([id number]) -------------------------------------------------------------------------------- [place] Details Patient Name: Cheyenne Riggs. Date of Service: 11/20/2014 3:00 PM Medical Record Number: [id number] Patient Account Number: [id number] Date of Birth/Sex: Nov 26, 1945 (68 y.o. Female) Treating RN: Montey Hora Primary Care Physician: Hulan Fess Other Clinician: Referring Physician: Hulan Fess Treating Physician/Extender: Frann Rider in Treatment: 2 Active Inactive Abuse / Safety / Falls / Self Care Management Nursing Diagnoses: Potential for falls Goals: Patient will remain injury free Date Initiated: 11/06/2014 Goal Status: Active Interventions: Assess fall risk on admission and as needed Notes: Necrotic Tissue Nursing Diagnoses: Impaired tissue integrity related to necrotic/devitalized tissue Goals: Patient/caregiver will verbalize understanding of reason and process for debridement of necrotic tissue Date Initiated: 11/06/2014 Goal Status: Active Interventions: Provide education on necrotic tissue and debridement process Notes: Nutrition Nursing Diagnoses: Potential for alteratiion in Nutrition/Potential for imbalanced nutrition Goals: Patient/caregiver agrees to and verbalizes understanding of need to use nutritional supplements and/or vitamins as prescribed Cheyenne Riggs, Cheyenne Riggs ([id number]) Date Initiated: 11/06/2014 Goal Status: Active Interventions: Assess patient nutrition upon admission and as needed per policy Notes: Orientation to the Wound Care Program Nursing Diagnoses: Knowledge deficit related to the wound healing center program Goals: Patient/caregiver will verbalize understanding of the [place] Program Date Initiated: 11/06/2014 Goal Status: Active Interventions: Provide education on orientation to the wound center Notes: Wound/Skin Impairment Nursing Diagnoses: Impaired tissue integrity Goals: Ulcer/skin breakdown will have a volume reduction of 30% by week 4 Date Initiated: 11/06/2014 Goal Status: Active Interventions: Assess ulceration(s) every visit Notes: Electronic Signature(s) Signed: 11/20/2014 4:43:54 PM By: Montey Hora Entered By: Montey Hora  on 11/20/2014 15:18:46 Cheyenne Riggs, Cheyenne Riggs ([id number]) -------------------------------------------------------------------------------- Pain Assessment  Details Patient Name: Cheyenne Riggs, Cheyenne Riggs. Date of Service: 11/20/2014 3:00 PM Medical Record Number: [id number] Patient Account Number: [id number] Date of Birth/Sex: 1946-01-08 (68 y.o. Female) Treating RN: Montey Hora Primary Care Physician: Hulan Fess Other Clinician: Referring Physician: Hulan Fess Treating Physician/Extender: Frann Rider in Treatment: 2 Active Problems Location of Pain Severity and Description of Pain Patient Has Paino Yes Site Locations Pain Location: Pain in Ulcers With Dressing Change: Yes Duration of the Pain. Constant / Intermittento Constant Character of Pain Describe the Pain: Sharp, Shooting Pain Management and Medication Current Pain Management: Electronic Signature(s) Signed: 11/20/2014 4:43:54 PM By: Montey Hora Entered By: Montey Hora on 11/20/2014 15:09:41 Cheyenne Riggs ([id number]) -------------------------------------------------------------------------------- Patient/Caregiver Education Details Patient Name: Cheyenne Riggs. Date of Service: 11/20/2014 3:00 PM Medical Record Number: [id number] Patient Account Number: [id number] Date of Birth/Gender: May 21, 1945 (68 y.o. Female) Treating RN: Montey Hora Primary Care Physician: Hulan Fess Other Clinician: Referring Physician: Hulan Fess Treating Physician/Extender: Frann Rider in Treatment: 2 Education Assessment Education Provided To: Patient Education Topics Provided Hyperbaric Oxygenation: Handouts: Other: HBO by Dr Con Memos Methods: Explain/Verbal Responses: State content correctly Wound/Skin Impairment: Handouts: Other: wound car to continue as ordered Methods: Demonstration, Explain/Verbal Responses: State content correctly Electronic Signature(s) Signed: 11/20/2014 4:39:03 PM By: Montey Hora Entered By: Montey Hora on 11/20/2014 16:39:03 Cheyenne Riggs  ([id number]) -------------------------------------------------------------------------------- Wound Assessment Details Patient Name: Cheyenne Riggs. Date of Service: 11/20/2014 3:00 PM Medical Record Number: [id number] Patient Account Number: [id number] Date of Birth/Sex: May 12, 1946 (68 y.o. Female) Treating RN: Montey Hora Primary Care Physician: Hulan Fess Other Clinician: Referring Physician: Hulan Fess Treating Physician/Extender: Frann Rider in Treatment: 2 Wound Status Wound Number: 1 Primary Pressure Ulcer Etiology: Wound Location: Right Calcaneous Wound Open Wounding Event: Pressure Injury Status: Date Acquired: 08/18/2014 Comorbid Arrhythmia, Congestive Heart Failure, Weeks Of Treatment: 2 History: Hypertension, Peripheral Venous Clustered Wound: No Disease, Type II Diabetes, Neuropathy, Received Chemotherapy Photos Photo Uploaded By: Montey Hora on 11/20/2014 16:13:48 Wound Measurements Length: (cm) 0.2 Width: (cm) 0.2 Depth: (cm) 0.1 Area: (cm) 0.031 Volume: (cm) 0.003 % Reduction in Area: 85.9% % Reduction in Volume: 86.4% Epithelialization: None Tunneling: No Undermining: No Wound Description Classification: Category/Stage II Foul Odor A Diabetic Severity (Wagner): Grade 1 Wound Margin: Flat and Intact Exudate Amount: Small Exudate Type: Serous Exudate Color: amber fter Cleansing: No Wound Bed Granulation Amount: Small (1-33%) Exposed Structure Necrotic Amount: Large (67-100%) Fascia Exposed: No Cheyenne Riggs, Cheyenne P. ([id number]) Necrotic Quality: Eschar Fat Layer Exposed: No Tendon Exposed: No Muscle Exposed: No Joint Exposed: No Bone Exposed: No Limited to Skin Breakdown Periwound Skin Texture Texture Color No Abnormalities Noted: No No Abnormalities Noted: No Callus: No Atrophie Blanche: No Crepitus: No Cyanosis: No Excoriation: No Ecchymosis: No Fluctuance: No Erythema: No Friable: No Hemosiderin Staining:  No Induration: No Mottled: No Localized Edema: No Pallor: No Rash: No Rubor: No Scarring: No Temperature / Pain Moisture Temperature: No Abnormality No Abnormalities Noted: No Dry / Scaly: Yes Maceration: No Moist: No Wound Preparation Ulcer Cleansing: Rinsed/Irrigated with Saline Topical Anesthetic Applied: Other: lidocaine 4%, Electronic Signature(s) Signed: 11/20/2014 4:43:54 PM By: Montey Hora Entered By: Montey Hora on 11/20/2014 15:37:49 Cheyenne Riggs ([id number]) -------------------------------------------------------------------------------- Wound Assessment Details Patient Name: Cheyenne Riggs. Date of Service: 11/20/2014 3:00 PM Medical Record Number: [id number] Patient Account Number: [id number] Date of Birth/Sex: 11-05-1945 (68 y.o. Female) Treating RN: Montey Hora Primary Care Physician: Hulan Fess Other Clinician:  Referring Physician: Hulan Fess Treating Physician/Extender: Frann Rider in Treatment: 2 Wound Status Wound Number: 2 Primary Diabetic Wound/Ulcer of the Lower Etiology: Extremity Wound Location: Right Toe Great Wound Open Wounding Event: Gradually Appeared Status: Date Acquired: 08/18/2014 Comorbid Arrhythmia, Congestive Heart Failure, Weeks Of Treatment: 2 History: Hypertension, Peripheral Venous Clustered Wound: No Disease, Type II Diabetes, Neuropathy, Received Chemotherapy Photos Photo Uploaded By: Montey Hora on 11/20/2014 16:13:48 Wound Measurements Length: (cm) 2.2 % Reduction in Width: (cm) 1.4 % Reduction in Depth: (cm) 0.3 Epithelializati Area: (cm) 2.419 Tunneling: Volume: (cm) 0.726 Undermining: Area: 27.5% Volume: 27.5% on: None No No Wound Description Classification: Unable to visualize wound bed Wound Margin: Flat and Intact Exudate Amount: Small Exudate Type: Serous Exudate Color: amber Foul Odor After Cleansing: No Wound Bed Granulation Amount: Medium (34-66%) Exposed  Structure Granulation Quality: Red Fascia Exposed: No Necrotic Amount: Medium (34-66%) Fat Layer Exposed: No Cheyenne Riggs, AGUINALDO. ([id number]) Necrotic Quality: Adherent Slough Tendon Exposed: No Muscle Exposed: No Joint Exposed: No Bone Exposed: No Limited to Skin Breakdown Periwound Skin Texture Texture Color No Abnormalities Noted: No No Abnormalities Noted: No Callus: No Atrophie Blanche: No Crepitus: No Cyanosis: No Excoriation: No Ecchymosis: No Fluctuance: No Erythema: No Friable: No Hemosiderin Staining: No Induration: No Mottled: No Localized Edema: No Pallor: No Rash: No Rubor: No Scarring: No Temperature / Pain Moisture Temperature: No Abnormality No Abnormalities Noted: No Dry / Scaly: No Maceration: No Moist: Yes Wound Preparation Ulcer Cleansing: Rinsed/Irrigated with Saline Topical Anesthetic Applied: Other: lidocaine 4%, Electronic Signature(s) Signed: 11/20/2014 4:43:54 PM By: Montey Hora Entered By: Montey Hora on 11/20/2014 15:14:56

## 2014-11-23 ENCOUNTER — Telehealth: Payer: Self-pay | Admitting: Cardiovascular Disease

## 2014-11-23 ENCOUNTER — Encounter: Payer: Self-pay | Admitting: *Deleted

## 2014-11-23 NOTE — Progress Notes (Signed)
Location of Breast Cancer:Right upper-outer breast  Histology per Pathology Report  Receptor Status: ER(-), PR (-), Her2-neu (-)/Triple negative  Did patient present with symptoms (if so, please note symptoms) or was this found on screening mammography?: Found on routine screening mammogram.  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   SAFETY ISSUES:  Prior radiation? No  Pacemaker/ICD? No  Possible current pregnancy?No  Is the patient on methotrexate? NO  Current Complaints / other details:Divorced.Menarche 66.First child age 15.BX G1 History of c.difficile  Allergies:ambien, cipro,lidocaine,oxycodone.demrol,septra, codeine, novocaine and vancomycin.  Gangrenous toes BP 160/70 mmHg  Pulse 65  Temp(Src) 97.8 F (36.6 C)  Wt 156 lb 8 oz (70.988 kg)  SpO2 100%  Wt Readings from Last 3 Encounters:  11/26/14 156 lb 8 oz (70.988 kg)  11/11/14 159 lb (72.122 kg)  11/10/14 156 lb 12.8 oz (71.124 kg)      Arlyss Repress, RN 11/23/2014,10:22 AM

## 2014-11-25 ENCOUNTER — Other Ambulatory Visit: Payer: Self-pay | Admitting: Hematology and Oncology

## 2014-11-25 MED ORDER — LORAZEPAM 0.5 MG PO TABS: 0.5000 mg | ORAL_TABLET | Freq: Three times a day (TID) | ORAL | Status: DC | PRN

## 2014-11-25 NOTE — Addendum Note (Signed)
Addended by: Prentiss Bells on: 11/25/2014 03:08 PM   Modules accepted: Orders

## 2014-11-25 NOTE — Progress Notes (Signed)
   Department of Radiation Oncology  Phone:  [phone] Fax:        [phone]   Name: STEHANIE EKSTROM MRN: [id number]  DOB: [birth year]  Date: 11/26/2014  Follow Up Visit Note  Diagnosis: T2N0 Right Breast Cancer  Interval History: Cheyenne Riggs presents today for routine followup. 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 2 sentinel lymph nodes were negative. This tumor was triple negative. She underwent adjuvant chemo which was stopped early due to multiple hospitalizations. Last chemotherapy was in March 2016. She is now ready for radiation. She is attending wound care in [place] due to the wound center here being full and has been told that she has a "bone infection" She is waiting to hear back form her vascular surgeon, Dr. Doren Custard, on a time to meet with him to discuss further plans.   Physical Exam:  Filed Vitals:   11/26/14 0936  BP: 160/70  Pulse: 65  Temp: 97.8 F (36.6 C)  Weight: 156 lb 8 oz (70.988 kg)  SpO2: 100%   Pleasant female. No distress. Alert and oriented. Using a rolling wheelchair  IMPRESSION: Mistee is a 69 y.o. female s/p breast conservation and chemotherapy now ready for radiation.   PLAN:  I spoke to the patient today regarding her diagnosis and options for treatment. We discussed the equivalence in terms of survival and local failure between mastectomy and breast conservation. We discussed the role of radiation in decreasing local failures in patients who undergo lumpectomy. We discussed the process of simulation and the placement tattoos. We discussed 6 weeks of treatment as an outpatient. We discussed the possibility of asymptomatic lung damage. We discussed the low likelihood of secondary malignancies. We discussed the possible side effects including but not limited to skin redness, fatigue, permanent skin darkening, and breast swelling.   I spoke with Dr. Doren Custard and we will proceed on with breast  radiation. He will evaluate her foot for possible surgical intervention.   This document serves as a record of services personally performed by Thea Silversmith, MD. It was created on her behalf by Darcus Austin, a trained medical scribe. The creation of this record is based on the scribe's personal observations and the provider's statements to them. This document has been checked and approved by the attending provider.   Thea Silversmith, MD

## 2014-11-26 ENCOUNTER — Other Ambulatory Visit: Payer: Self-pay

## 2014-11-26 ENCOUNTER — Telehealth: Payer: Self-pay | Admitting: Cardiovascular Disease

## 2014-11-26 ENCOUNTER — Ambulatory Visit
Admission: RE | Admit: 2014-11-26 | Discharge: 2014-11-26 | Disposition: A | Discharge status: Home / Self Care | Payer: Medicare Other | Source: Ambulatory Visit | Attending: Radiation Oncology | Admitting: Radiation Oncology

## 2014-11-26 VITALS — BP 160/70 | HR 65 | Temp 97.8°F | Wt 156.5 lb

## 2014-11-26 DIAGNOSIS — C50911 Malignant neoplasm of unspecified site of right female breast: Secondary | ICD-10-CM | POA: Insufficient documentation

## 2014-11-26 DIAGNOSIS — Z510 Encounter for antineoplastic radiation therapy: Secondary | ICD-10-CM | POA: Insufficient documentation

## 2014-11-26 DIAGNOSIS — C50411 Malignant neoplasm of upper-outer quadrant of right female breast: Secondary | ICD-10-CM

## 2014-11-26 DIAGNOSIS — Z171 Estrogen receptor negative status [ER-]: Secondary | ICD-10-CM | POA: Insufficient documentation

## 2014-11-26 DIAGNOSIS — Z9221 Personal history of antineoplastic chemotherapy: Secondary | ICD-10-CM | POA: Diagnosis not present

## 2014-11-26 NOTE — Telephone Encounter (Signed)
Will route this surgical clearance information to pts Cardiologist to advise on stopping Eliquis 3 days prior to scheduled surgery of Left Great Toe on 12/01/14, and someone from the office will follow-up with Dr Mee Hives office with med clearance recommendations.

## 2014-11-26 NOTE — Progress Notes (Signed)
Please see the Nurse Progress Note in the MD Initial Consult Encounter for this patient. 

## 2014-11-26 NOTE — Telephone Encounter (Signed)
Request for surgical clearance:  1. What type of surgery is being performed? Left great toe amputation   2. When is this surgery scheduled? 7/19  3. Are there any medications that need to be held prior to surgery and how long? Eliquis - 3 days if possible   4. Name of physician performing surgery? [facility]  5. What is your office phone and fax number? Fax# [phone] 6.

## 2014-11-27 ENCOUNTER — Encounter (HOSPITAL_COMMUNITY): Payer: Self-pay | Admitting: *Deleted

## 2014-11-27 ENCOUNTER — Encounter: Payer: Medicare Other | Admitting: Surgery

## 2014-11-27 DIAGNOSIS — M868X7 Other osteomyelitis, ankle and foot: Secondary | ICD-10-CM | POA: Diagnosis not present

## 2014-11-27 DIAGNOSIS — L97522 Non-pressure chronic ulcer of other part of left foot with fat layer exposed: Secondary | ICD-10-CM | POA: Diagnosis not present

## 2014-11-27 DIAGNOSIS — E11621 Type 2 diabetes mellitus with foot ulcer: Secondary | ICD-10-CM | POA: Diagnosis not present

## 2014-11-27 DIAGNOSIS — I10 Essential (primary) hypertension: Secondary | ICD-10-CM | POA: Diagnosis not present

## 2014-11-27 DIAGNOSIS — Z9221 Personal history of antineoplastic chemotherapy: Secondary | ICD-10-CM | POA: Diagnosis not present

## 2014-11-27 DIAGNOSIS — L89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, stage 2: Secondary | ICD-10-CM | POA: Diagnosis not present

## 2014-11-27 NOTE — Telephone Encounter (Signed)
Ok to hold Eliquis for 3 days prior to toe amputation

## 2014-11-27 NOTE — Patient Outreach (Signed)
[facility]) Care Management  11/27/2014  ARIE GABLE [birth year] [id number]   Notification from Gardiner Fanti, RN to close case due to patient wishes to withdraw from [place] Management services.  Ronnell Freshwater. [place], [place] Management [place] Assistant Phone: [phone] Fax: [phone]

## 2014-11-28 ENCOUNTER — Other Ambulatory Visit: Payer: Self-pay | Admitting: Hematology and Oncology

## 2014-11-28 NOTE — Progress Notes (Signed)
KIELE, HEAVRIN ([id number]) Visit Report for 11/27/2014 HBO Risk Assessment Details Patient Name: Cheyenne Riggs, Cheyenne Riggs 11/27/2014 10:15 Date of Service: AM Medical Record [id number] Number: Patient Account Number: [id number] [birth year] (69 y.o. Treating RN: Cornell Barman Date of Birth/Sex: Female) Other Clinician: Primary Care Physician: Hulan Fess Treating Britto, Errol Referring Physician: Hulan Fess Physician/Extender: Suella [place] in Treatment: 3 HBO Risk Assessment Items Answer Barotrauma Risks: Upper Respiratory Infections No Prior Radiation Treatment to Head/Neck No Tracheostomy No Ear problems or surgery (otosclerosis)- Consider pressure No equalization tubes Sinus Problems, Sinus Obstruction No Pulmonary Risks: Currently seeing a pulmonologisto No Emphysema No Pneumothorax No Tuberculosis No Other lung problems (COPD with CO2 retention, lesions, surgery) No -Refer to CPGs Congestive heart Failure -Consider holding HBO if ejection Yes fraction<30% Asthma or Bronchitis Controlled History of smoking Yes Bullous Disease, Blebs No Other pulmonary abnormalities No Cardiac Risks: Currently seeing a cardiologisto Yes Pacemaker/AICD No Hypertension Yes as needed, Diuretic Used (water pill). If yes, last time taken: Yes 11/19/2014 History of prior or current malignancy (Cancer) KEVONNA, NOLTE ([id number]) Surgery Yes Radiation therapy No Chemotherapy Yes Ophthalmic Risks: Optic Neuritis No Cataracts No Myopia No Retinopathy or Retinal Detachment Surgery- Consider pressure No equalization tubes Confinement Anxiety Claustrophobia Yes Dialysis Dialysis No Any implants; medical or non-medical Yes dental if Yes for implants list; Type, Brand and Model implants Pregnancy No Diabetes HgbA1C within 3 months Yes Seizures Seizures No Currently using these medications: Aspirin No Digoxin (CHF patient) No Narcotics Yes Nitroprusside No Phenothiazine (Thorazine,etc.)  No Prednisone or other steroids No Disulfiram (Antabuse) No Mafenide Acetate (Sulfamylon-burn cream) No Amiodarone No Notes Will be beginning radiation next week. Dental Implants. Electronic Signature(s) Signed: 11/27/2014 5:11:11 PM By: Gretta Cool, RN, BSN, Kim RN, BSN Entered By: Gretta Cool, RN, BSN, Kim on 11/27/2014 10:53:29

## 2014-11-28 NOTE — Progress Notes (Signed)
Cheyenne Riggs, Cheyenne Riggs ([id number]) Visit Report for 11/27/2014 Chief Complaint Document Details Patient Name: Cheyenne Riggs, Cheyenne Riggs 11/27/2014 10:15 Date of Service: AM Medical Record [id number] Number: Patient Account Number: [id number] 09-02-1945 (69 y.o. Treating RN: Date of Birth/Sex: Female) Other Clinician: Primary Care Physician: Hulan Fess Treating Marlee Armenteros Referring Physician: Hulan Fess Physician/Extender: Suella Grove in Treatment: 3 Information Obtained from: Patient Chief Complaint Patient presents to the wound care center for a consult due non healing wound. extremities-year-old patient who is coming with a ulcer on the left heel and left big toe for about 2-1/2 months. Electronic Signature(s) Signed: 11/27/2014 10:44:41 AM By: Christin Fudge MD, FACS Entered By: Christin Fudge on 11/27/2014 10:44:40 Cheyenne Riggs ([id number]) -------------------------------------------------------------------------------- Debridement Details Patient Name: Cheyenne Riggs, Cheyenne Riggs 11/27/2014 10:15 Date of Service: AM Medical Record [id number] Number: Patient Account Number: [id number] [birth year] (69 y.o. Treating RN: Date of Birth/Sex: Female) Other Clinician: Primary Care Physician: Hulan Fess Treating Shamon Cothran Referring Physician: Hulan Fess Physician/Extender: Weeks in Treatment: 3 Debridement Performed for Wound #2 Left Toe Great Assessment: Performed By: Physician Pat Patrick., MD Debridement: Debridement Pre-procedure Yes Verification/Time Out Taken: Start Time: 10:37 Pain Control: Other : idocaine 4% Level: Skin/Subcutaneous Tissue Total Area Debrided (L x 1.7 (cm) x 0.8 (cm) = 1.36 (cm) W): Tissue and other Viable, Non-Viable, Eschar, Fibrin/Slough, Subcutaneous material debrided: Instrument: Curette Bleeding: Moderate Hemostasis Achieved: Pressure End Time: 10:38 Procedural Pain: 0 Post Procedural Pain: 0 Response to Treatment: Procedure was tolerated  well Post Debridement Measurements of Total Wound Length: (cm) 1.7 Width: (cm) 0.8 Depth: (cm) 0.4 Volume: (cm) 0.427 Electronic Signature(s) Signed: 11/27/2014 10:44:32 AM By: Christin Fudge MD, FACS Entered By: Christin Fudge on 11/27/2014 10:44:32 Cheyenne Riggs ([id number]) -------------------------------------------------------------------------------- HPI Details Patient Name: Cheyenne Riggs 11/27/2014 10:15 Date of Service: AM Medical Record [id number] Number: Patient Account Number: [id number] [birth year] (70 y.o. Treating RN: Date of Birth/Sex: Female) Other Clinician: Primary Care Physician: Hulan Fess Treating Dashonna Chagnon Referring Physician: Hulan Fess Physician/Extender: Weeks in Treatment: 3 History of Present Illness Location: left big toe and left heel Quality: Patient reports No Pain. Severity: Patient states wound (s) are getting better. Duration: Patient has had the wound for > 3 months prior to seeking treatment at the wound center Context: The wound occurred when the patient was admitted to hospital with a kidney failure. Modifying Factors: Consults to this date include:vascular opinion which was negative for any embolic phenomena or vascular problems. Associated Signs and Symptoms: Patient reports having difficulty standing for long periods. HPI Description: 69 year old patient who is known to have essential hypertension, type 2 diabetes mellitus, chronic kidney disease, trigger tobacco dependence now reformed smoker, recent problems with kidney failure. During her hospitalization recently she developed hypotension tachycardia and several other complications and during that time it was noted that all her toes and fingertips were mottled and she was seen by by vascular surgery. incidental Dr. Scot Dock has done a study and was told essentially that she did not require any angiographic surgery or angioplasty. She has recovered from acute kidney failure  with tubular necrosis, possible myocardial infarction, thrombocytopenia and has now been also diagnosed with breast cancer and she's been to take chemotherapy and radiation. Addendum: 11/13/2014 -- Xray of the left foot was done today -- IMPRESSION: Focal osteomyelitis of the tuft of the distal phalanx of the great toe. 11/20/2014 -- X-ray of the left great toe revealed focal osteomyelitis of the tuft of the distal phalanx of the great toe. Surgical pathology of  the bone revealed fragments of bone with osteomyelitis. 11/27/2014 -- On 11/24/2011 I spoke today to Dr. Deitra Mayo and discussed the need for debridement of the distal tuft of her left toe and possible primary closure with a drain. He is agreeable to get this scheduled and he will do this in the near future. her cardiologist Dr. Sharilyn Sites has said that that test of EKG, echo and chest x-ray were to be scheduled at the office. Have discussed with Matie that she needs to see the cardiologist to get a clearance regarding hyperbaric oxygen therapy. Electronic Signature(s) Signed: 11/27/2014 10:46:25 AM By: Christin Fudge MD, FACS Entered By: Christin Fudge on 11/27/2014 10:46:24 Cheyenne Riggs ([id number], WINBUSH ([id number]) -------------------------------------------------------------------------------- Physical Exam Details Patient Name: Cheyenne Riggs, Cheyenne Riggs 11/27/2014 10:15 Date of Service: AM Medical Record [id number] Number: Patient Account Number: [id number] 03/17/1946 (69 y.o. Treating RN: Date of Birth/Sex: Female) Other Clinician: Primary Care Physician: Hulan Fess Treating Etienne Millward Referring Physician: Hulan Fess Physician/Extender: Weeks in Treatment: 3 Constitutional . Pulse regular. Respirations normal and unlabored. Afebrile. . Eyes Nonicteric. Reactive to light. Ears, Nose, Mouth, and Throat Lips, teeth, and gums WNL.Marland Kitchen Moist mucosa without lesions . Neck supple and nontender. No palpable  supraclavicular or cervical adenopathy. Normal sized without goiter. Respiratory WNL. No retractions.. Cardiovascular Pedal Pulses WNL. No clubbing, cyanosis or edema. Chest Breasts symmetical and no nipple discharge.. Breast tissue WNL, no masses, lumps, or tenderness.. Musculoskeletal Adexa without tenderness or enlargement.. Digits and nails w/o clubbing, cyanosis, infection, petechiae, ischemia, or inflammatory conditions.. Integumentary (Hair, Skin) No suspicious lesions. No crepitus or fluctuance. No peri-wound warmth or erythema. No masses.Marland Kitchen Psychiatric Judgement and insight Intact.. No evidence of depression, anxiety, or agitation.. Notes on the left big toe there is some granulation tissue and there is still some bone protruding and debridement has been done as much as possible. Electronic Signature(s) Signed: 11/27/2014 10:47:21 AM By: Christin Fudge MD, FACS Previous Signature: 11/27/2014 10:47:04 AM Version By: Christin Fudge MD, FACS Entered By: Christin Fudge on 11/27/2014 10:47:20 Cheyenne Riggs ([id number]) -------------------------------------------------------------------------------- Physician Orders Details Patient Name: Cheyenne Riggs, Cheyenne Riggs 11/27/2014 10:15 Date of Service: AM Medical Record [id number] Number: Patient Account Number: [id number] [birth year] (69 y.o. Treating RN: Cornell Barman Date of Birth/Sex: Female) Other Clinician: Primary Care Physician: Hulan Fess Treating Vernestine Brodhead Referring Physician: Hulan Fess Physician/Extender: Suella Grove in Treatment: 3 Verbal / Phone Orders: Yes Clinician: Cornell Barman Read Back and Verified: Yes Diagnosis Coding Wound Cleansing Wound #2 Left Toe Great o Clean wound with Normal Saline. Anesthetic Wound #2 Left Toe Great o Topical Lidocaine 4% cream applied to wound bed prior to debridement Skin Barriers/Peri-Wound Care Wound #2 Left Toe Great o Skin Prep Primary Wound Dressing Wound #2 Left Toe Great o  Santyl Ointment Secondary Dressing Wound #2 Left Toe Great o Conform/Kerlix Dressing Change Frequency Wound #2 Left Toe Great o Change dressing every day. Follow-up Appointments Wound #2 Left Toe Great o Return Appointment in 1 week. Home Health Wound #2 Left Toe Great o Continue Home Health Visits - [place] Nurse may visit PRN to address patientos wound care needs. BLAIKE, NEWBURN ([id number]) o FACE TO FACE ENCOUNTER: MEDICARE and MEDICAID PATIENTS: I certify that this patient is under my care and that I had a face-to-face encounter that meets the physician face-to-face encounter requirements with this patient on this date. The encounter with the patient was in whole or in part for the following MEDICAL CONDITION: (primary reason for Home  Healthcare) MEDICAL NECESSITY: I certify, that based on my findings, NURSING services are a medically necessary home health service. HOME BOUND STATUS: I certify that my clinical findings support that this patient is homebound (i.e., Due to illness or injury, pt requires aid of supportive devices such as crutches, cane, wheelchairs, walkers, the use of special transportation or the assistance of another person to leave their place of residence. There is a normal inability to leave the home and doing so requires considerable and taxing effort. Other absences are for medical reasons / religious services and are infrequent or of short duration when for other reasons). o If current dressing causes regression in wound condition, may D/C ordered dressing product/s and apply Normal Saline Moist Dressing daily until next [place] / Other MD appointment. [place] of regression in wound condition at [phone]. o Please direct any NON-WOUND related issues/requests for orders to patient's Primary Care Physician Medications-please add to medication list. Wound #2 Left Toe Great o Santyl  Enzymatic Ointment Discharge From [facility] Wound #2 Left Toe Great o Discharge from [place] care to [facility]. Appt: 12/02/2014 @ 1pm Electronic Signature(s) Signed: 11/27/2014 12:17:31 PM By: Christin Fudge MD, FACS Signed: 11/27/2014 5:11:11 PM By: Gretta Cool RN, BSN, Kim RN, BSN Entered By: Gretta Cool, RN, BSN, Kim on 11/27/2014 11:25:02 Cheyenne Riggs, HUESCA ([id number]) -------------------------------------------------------------------------------- Prescription 11/27/2014 Patient Name: Cheyenne Riggs Physician: Christin Fudge MD Date of Birth: Aug 17, 1945 NPI#: [id number] Sex: F DEA#: [id number] Phone #: [phone] License #: Patient Address: [place], [place] [address] [facility] [address] [place], [place] [address] [phone] Allergies Demerol novacaine codeine Cipro Lipitor Vytorin lovastatin Septra NSAIDS (Non-Steroidal Anti-Inflammatory Drug) Crestor Reaction: doses higher than 40mg  Physician's Orders Santyl Enzymatic Ointment Cheyenne Riggs, Cheyenne Riggs ([id number]) Signature(s): Date(s): Electronic Signature(s) Signed: 11/27/2014 12:17:31 PM By: Christin Fudge MD, FACS Signed: 11/27/2014 5:11:11 PM By: Gretta Cool, RN, BSN, Kim RN, BSN Entered By: Gretta Cool, RN, BSN, Kim on 11/27/2014 11:25:02 Cheyenne Riggs, Cheyenne Riggs ([id number]) --------------------------------------------------------------------------------  Problem List Details Patient Name: Cheyenne Riggs, Cheyenne Riggs 11/27/2014 10:15 Date of Service: AM Medical Record [id number] Number: Patient Account Number: [id number] 1945-09-12 (69 y.o. 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 Z92.21 Personal history of antineoplastic chemotherapy 11/06/2014 Yes Z87.891 Personal history of nicotine dependence 11/06/2014 Yes L89.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, stage 2 11/06/2014 Yes Inactive Problems Resolved Problems Electronic Signature(s) Signed: 11/27/2014 10:43:47 AM By: Christin Fudge MD, FACS Entered By: Christin Fudge on 11/27/2014 10:43:47 Cheyenne Riggs ([id number]) -------------------------------------------------------------------------------- Progress Note Details Patient Name: Cheyenne Riggs, Cheyenne Riggs 11/27/2014 10:15 Date of Service: AM Medical Record [id number] Number: Patient Account Number: [id number] December 11, 1945 (69 y.o. Treating RN: Date of Birth/Sex: Female) Other Clinician: Primary Care Physician: Hulan Fess Treating Lyndall Windt Referring Physician: Hulan Fess Physician/Extender: Suella Grove in Treatment: 3 Subjective Chief Complaint Information obtained from Patient Patient presents to the wound care center for a consult due non healing wound. extremities-year-old patient who is coming with a ulcer on the left heel and left big toe for about 2-1/2 months. History of Present Illness (HPI) The following HPI elements  were documented for the patient's wound: Location: left big toe and left heel Quality: Patient reports No Pain. Severity: Patient states wound (s) are getting better. Duration: Patient has had the wound for > 3 months prior to seeking treatment at the wound center Context: The wound occurred when the patient was admitted to hospital with a kidney failure. Modifying Factors: Consults to this date include:vascular opinion which was negative for any embolic phenomena or vascular problems. Associated Signs and Symptoms: Patient reports having difficulty standing for long  periods. 69 year old patient who is known to have essential hypertension, type 2 diabetes mellitus, chronic kidney disease, trigger tobacco dependence now reformed smoker, recent problems with kidney failure. During her hospitalization recently she developed hypotension tachycardia and several other complications and during that time it was noted that all her toes and fingertips were mottled and she was seen by by vascular surgery. incidental Dr. Scot Dock has done a study and was told essentially that she did not require any angiographic surgery or angioplasty. She has recovered from acute kidney failure with tubular necrosis, possible myocardial infarction, thrombocytopenia and has now been also diagnosed with breast cancer and she's been to take chemotherapy and radiation. Addendum: 11/13/2014 -- Xray of the left foot was done today -- IMPRESSION: Focal osteomyelitis of the tuft of the distal phalanx of the great toe. 11/20/2014 -- X-ray of the left great toe revealed focal osteomyelitis of the tuft of the distal phalanx of the great toe. Surgical pathology of the bone revealed fragments of bone with osteomyelitis. 11/27/2014 -- On 11/24/2011 I spoke today to Dr. Deitra Mayo and discussed the need for debridement of the distal tuft of her left toe and possible primary closure with a drain. He is agreeable to get this scheduled and he will do this in the near future. Cheyenne Riggs, Cheyenne Riggs ([id number]) her cardiologist Dr. Sharilyn Sites has said that that test of EKG, echo and chest x-ray were to be scheduled at the office. Have discussed with [place] that she needs to see the cardiologist to get a clearance regarding hyperbaric oxygen therapy. Objective Constitutional Pulse regular. Respirations normal and unlabored. Afebrile. Vitals Time Taken: 10:19 AM, Height: 63 in, Weight: 157 lbs, BMI: 27.8, Temperature: 97.7 F, Pulse: 64 bpm, Respiratory Rate: 18 breaths/min, Blood Pressure: 168/79  mmHg. Eyes Nonicteric. Reactive to light. Ears, Nose, Mouth, and Throat Lips, teeth, and gums WNL.Marland Kitchen Moist mucosa without lesions . Neck supple and nontender. No palpable supraclavicular or cervical adenopathy. Normal sized without goiter. Respiratory WNL. No retractions.. Cardiovascular Pedal Pulses WNL. No clubbing, cyanosis or edema. Chest Breasts symmetical and no nipple discharge.. Breast tissue WNL, no masses, lumps, or tenderness.. Musculoskeletal Adexa without tenderness or enlargement.. Digits and nails w/o clubbing, cyanosis, infection, petechiae, ischemia, or inflammatory conditions.Marland Kitchen Psychiatric Judgement and insight Intact.. No evidence of depression, anxiety, or agitation.. General Notes: on the left big toe there is some granulation tissue and there is still some bone protruding and debridement has been done as much as possible. Integumentary (Hair, Skin) Steckman, Camilia P. ([id number]) No suspicious lesions. No crepitus or fluctuance. No peri-wound warmth or erythema. No masses.. Wound #1 status is Healed - Epithelialized. Original cause of wound was Pressure Injury. The wound is located on the Left Calcaneous. The wound measures 0cm length x 0cm width x 0cm depth; 0cm^2 area and 0cm^3 volume. The wound is limited to skin breakdown. There is a small amount of serous drainage noted. The wound margin is flat and intact. There is small (1-33%) granulation  within the wound bed. There is a large (67-100%) amount of necrotic tissue within the wound bed including Eschar. The periwound skin appearance exhibited: Dry/Scaly. The periwound skin appearance did not exhibit: Callus, Crepitus, Excoriation, Fluctuance, Friable, Induration, Localized Edema, Rash, Scarring, Maceration, Moist, Atrophie Blanche, Cyanosis, Ecchymosis, Hemosiderin Staining, Mottled, Pallor, Rubor, Erythema. Periwound temperature was noted as No Abnormality. Wound #2 status is Open. Original cause of wound was  Gradually Appeared. The wound is located on the Left Toe Great. The wound measures 1.7cm length x 0.8cm width x 0.3cm depth; 1.068cm^2 area and 0.32cm^3 volume. The wound is limited to skin breakdown. There is a small amount of serous drainage noted. The wound margin is flat and intact. There is medium (34-66%) red granulation within the wound bed. There is a medium (34-66%) amount of necrotic tissue within the wound bed including Adherent Slough. The periwound skin appearance exhibited: Moist. The periwound skin appearance did not exhibit: Callus, Crepitus, Excoriation, Fluctuance, Friable, Induration, Localized Edema, Rash, Scarring, Dry/Scaly, Maceration, Atrophie Blanche, Cyanosis, Ecchymosis, Hemosiderin Staining, Mottled, Pallor, Rubor, Erythema. Periwound temperature was noted as No Abnormality. Assessment Active Problems ICD-10 E11.621 - Type 2 diabetes mellitus with foot ulcer L97.522 - Non-pressure chronic ulcer of other part of left foot with fat layer exposed Z92.21 - Personal history of antineoplastic chemotherapy Z87.891 - Personal history of nicotine dependence L89.622 - Pressure ulcer of left heel, stage 2 The toe is looking better and we will continue with Santyl locally and she is scheduled for debridement of this to on Tuesday next week. Does see a cardiologist office for clearance regarding hyperbaric oxygen therapy. She is going to follow-up with me in [place] and would probably like to take a hyperbaric oxygen therapy at [facility]. Procedures Cheyenne Riggs, Cheyenne Riggs ([id number]) Wound #2 Wound #2 is a Diabetic Wound/Ulcer of the Lower Extremity located on the Left Toe Great . There was a Skin/Subcutaneous Tissue Debridement ([id number]) debridement with total area of 1.36 sq cm performed by Moniqua Engebretsen, Jackson Latino., MD. with the following instrument(s): Curette to remove Viable and Non-Viable tissue/material including Fibrin/Slough, Eschar, and Subcutaneous after achieving pain  control using Other (idocaine 4%). A time out was conducted prior to the start of the procedure. A Moderate amount of bleeding was controlled with Pressure. The procedure was tolerated well with a pain level of 0 throughout and a pain level of 0 following the procedure. Post Debridement Measurements: 1.7cm length x 0.8cm width x 0.4cm depth; 0.427cm^3 volume. Plan Wound Cleansing: Wound #2 Left Toe Great: Clean wound with Normal Saline. Anesthetic: Wound #2 Left Toe Great: Topical Lidocaine 4% cream applied to wound bed prior to debridement Skin Barriers/Peri-Wound Care: Wound #2 Left Toe Great: Skin Prep Primary Wound Dressing: Wound #2 Left Toe Great: Santyl Ointment Secondary Dressing: Wound #2 Left Toe Great: Conform/Kerlix Dressing Change Frequency: Wound #2 Left Toe Great: Change dressing every day. Follow-up Appointments: Wound #2 Left Toe Great: Return Appointment in 1 week. Home Health: Wound #2 Left Toe Great: Continue Home Health Visits - [place] Nurse may visit PRN to address patient s wound care needs. FACE TO FACE ENCOUNTER: MEDICARE and MEDICAID PATIENTS: I certify that this patient is under my care and that I had a face-to-face encounter that meets the physician face-to-face encounter requirements with this patient on this date. The encounter with the patient was in whole or in part for the following MEDICAL CONDITION: (primary reason for [place]) MEDICAL NECESSITY: I certify, that based on my findings, NURSING services  are a medically necessary home health service. HOME BOUND STATUS: I certify that my clinical findings support that this patient is homebound (i.e., Due to illness or injury, pt requires aid of supportive devices such as crutches, cane, wheelchairs, walkers, the use of special transportation or the assistance of another person to leave their place of residence. There is a LENDY, DITTRICH. ([id number]) normal inability to leave  the home and doing so requires considerable and taxing effort. Other absences are for medical reasons / religious services and are infrequent or of short duration when for other reasons). If current dressing causes regression in wound condition, may D/C ordered dressing product/s and apply Normal Saline Moist Dressing daily until next [place] / Other MD appointment. [place] of regression in wound condition at [phone]. Please direct any NON-WOUND related issues/requests for orders to patient's Primary Care Physician Medications-please add to medication list.: Wound #2 Left Toe Great: Santyl Enzymatic Ointment Discharge From [facility] Services: Wound #2 Left Toe Great: Discharge from [place] care to [facility]. Appt: 12/02/2014 @ 1pm The toe is looking better and we will continue with Santyl locally and she is scheduled for debridement of this to on Tuesday next week. Does see a cardiologist office for clearance regarding hyperbaric oxygen therapy. She is going to follow-up with me in [place] and would probably like to take a hyperbaric oxygen therapy at [facility]. Electronic Signature(s) Signed: 11/27/2014 4:33:22 PM By: Christin Fudge MD, FACS Previous Signature: 11/27/2014 10:48:58 AM Version By: Christin Fudge MD, FACS Entered By: Christin Fudge on 11/27/2014 16:33:22 Cheyenne Riggs ([id number]) -------------------------------------------------------------------------------- SuperBill Details Patient Name: Cheyenne Riggs. Date of Service: 11/27/2014 Medical Record Number: [id number] Patient Account Number: [id number] Date of Birth/Sex: 07/08/1945 ([name]) Treating RN: Primary Care Physician: Hulan Fess Other Clinician: Referring Physician: Hulan Fess Treating Physician/Extender: Frann Rider in Treatment: 3 Diagnosis Coding ICD-10 Codes Code Description E11.621 Type 2 diabetes mellitus  with foot ulcer L97.522 Non-pressure chronic ulcer of other part of left foot with fat layer exposed Z92.21 Personal history of antineoplastic chemotherapy Z87.891 Personal history of nicotine dependence L89.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, stage 2 Facility Procedures CPT4 Code Description: [id number] 11042 - DEB SUBQ TISSUE 20 SQ CM/< ICD-10 Description Diagnosis 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 Z92.21 Personal history of antineoplastic  chemotherapy L89.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, stage 2 Modifier: with fat lay Quantity: 1 er exposed Physician Procedures CPT4 Code Description: [id number] 69450 - WC PHYS SUBQ TISS 20 SQ CM ICD-10 Description Diagnosis 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 Z92.21 Personal history of antineoplastic  chemotherapy L89.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, stage 2 Modifier: with fat laye Quantity: 1 r exposed Electronic Signature(s) Signed: 11/27/2014 10:49:18 AM By: Christin Fudge MD, FACS Entered By: Christin Fudge on 11/27/2014 10:49:17

## 2014-11-28 NOTE — Progress Notes (Addendum)
Cheyenne, Riggs ([id number]) Visit Report for 11/27/2014 Arrival Information Details Patient Name: Cheyenne Riggs, Cheyenne Riggs. Date of Service: 11/27/2014 10:15 AM Medical Record Number: [id number] Patient Account Number: [id number] Date of Birth/Sex: [birth year] (68 y.o. Female) Treating RN: Cornell Barman Primary Care Physician: Hulan Fess Other Clinician: Referring Physician: Hulan Fess Treating Physician/Extender: Frann Rider in Treatment: 3 Visit Information History Since Last Visit Added or deleted any medications: No Patient Arrived: Ambulatory Any new allergies or adverse reactions: No Arrival Time: 10:19 Had a fall or experienced change in No Accompanied By: self activities of daily living that may affect Transfer Assistance: None risk of falls: Patient Identification Verified: Yes Signs or symptoms of abuse/neglect since last No Secondary Verification Process Yes visito Completed: Hospitalized since last visit: No Patient Has Alerts: Yes Has Dressing in Place as Prescribed: Yes Patient Alerts: Patient on Blood Pain Present Now: No Thinner DMII eliquis Electronic Signature(s) Signed: 11/27/2014 5:11:11 PM By: Gretta Cool, RN, BSN, Kim RN, BSN Entered By: Gretta Cool, RN, BSN, Kim on 11/27/2014 10:19:52 Cheyenne Riggs ([id number]) -------------------------------------------------------------------------------- Encounter Discharge Information Details Patient Name: Cheyenne Riggs. Date of Service: 11/27/2014 10:15 AM Medical Record Number: [id number] Patient Account Number: [id number] Date of Birth/Sex: [birth year] (68 y.o. Female) Treating RN: Cornell Barman Primary Care Physician: Hulan Fess Other Clinician: Referring Physician: Hulan Fess Treating Physician/Extender: Frann Rider in Treatment: 3 Encounter Discharge Information Items Discharge Pain Level: 0 Discharge Condition: Stable Ambulatory Status: Walker Discharge Destination:  Home Private Transportation: Auto Accompanied By: self Schedule Follow-up Appointment: Yes Medication Reconciliation completed and Yes provided to Patient/Care Linnaea Ahn: Clinical Summary of Care: Electronic Signature(s) Signed: 11/27/2014 5:11:11 PM By: Gretta Cool, RN, BSN, Kim RN, BSN Entered By: Gretta Cool, RN, BSN, Kim on 11/27/2014 10:53:54 Cheyenne Riggs ([id number]) -------------------------------------------------------------------------------- General Visit Notes Details Patient Name: Cheyenne Riggs. Date of Service: 11/27/2014 10:15 AM Medical Record Number: [id number] Patient Account Number: [id number] Date of Birth/Sex: 17-Apr-1946 (68 y.o. Female) Treating RN: Cornell Barman Primary Care Physician: Hulan Fess Other Clinician: Referring Physician: Hulan Fess Treating Physician/Extender: Frann Rider in Treatment: 3 Notes Patient wound like to transfer her care to the [facility]. I have made her an appointment for Wednesday, July 20 with Dr. Con Memos. Per Dr. Ardeen Garland request, she has an appointment with her cardiologist the 24th and will request HBO workup to be done (Chest x-ray and Echo). Electronic Signature(s) Signed: 11/27/2014 5:11:11 PM By: Gretta Cool, RN, BSN, Kim RN, BSN Entered By: Gretta Cool, RN, BSN, Kim on 11/27/2014 11:15:09 Cheyenne Riggs ([id number]) -------------------------------------------------------------------------------- Lower Extremity Assessment Details Patient Name: Cheyenne Riggs. Date of Service: 11/27/2014 10:15 AM Medical Record Number: [id number] Patient Account Number: [id number] Date of Birth/Sex: 1946/02/12 (68 y.o. Female) Treating RN: Cornell Barman Primary Care Physician: Hulan Fess Other Clinician: Referring Physician: Hulan Fess Treating Physician/Extender: Frann Rider in Treatment: 3 Vascular Assessment Pulses: Posterior Tibial Dorsalis Pedis Palpable: [Left:Yes] Extremity colors, hair growth, and conditions: Extremity  Color: [Left:Normal] Hair Growth on Extremity: [Left:Yes] Temperature of Extremity: [Left:Warm] Capillary Refill: [Left:< 3 seconds] Toe Nail Assessment Left: Right: Thick: Yes Discolored: Yes Deformed: No Improper Length and Hygiene: No Electronic Signature(s) Signed: 11/27/2014 5:11:11 PM By: Gretta Cool, RN, BSN, Kim RN, BSN Entered By: Gretta Cool, RN, BSN, Kim on 11/27/2014 10:21:08 Cheyenne Riggs ([id number]) -------------------------------------------------------------------------------- Multi Wound Chart Details Patient Name: Cheyenne Riggs. Date of Service: 11/27/2014 10:15 AM Medical Record Number: [id number] Patient Account Number: [id number] Date of Birth/Sex: Feb 25, 1946 (68 y.o. 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] [N/A:N/A] Comorbid History: [1:Arrhythmia, Congestive Arrhythmia, Congestive Heart Failure, Hypertension, Peripheral Hypertension, Peripheral Venous Disease, Type II Venous Disease, Type II Diabetes, Neuropathy, Received Chemotherapy Received Chemotherapy]  [2:Heart Failure, Diabetes, Neuropathy,] [N/A:N/A] Date Acquired: [1:08/18/2014] [2:08/18/2014] [N/A:N/A] Weeks of Treatment: [1:3] [2:3] [N/A:N/A] Wound Status: [1:Healed - Epithelialized Open] [N/A:N/A] Measurements L x W x D 0x0x0 [2:1.7x0.8x0.3] [N/A:N/A] (cm) Area (cm) : [1:0] [2:1.068] [N/A:N/A] Volume (cm) : [1:0] [2:0.32]  [N/A:N/A] % Reduction in Area: [1:100.00%] [2:68.00%] [N/A:N/A] % Reduction in Volume: 100.00% [2:68.00%] [N/A:N/A] Classification: [1:Category/Stage II] [2:Grade 2] [N/A:N/A] HBO Classification: [1:Grade 1] [2:N/A] [N/A:N/A] Exudate Amount: [1:Small] [2:Small] [N/A:N/A] Exudate Type: [1:Serous] [2:Serous] [N/A:N/A] Exudate Color: [1:amber] [2:amber] [N/A:N/A] Wound Margin: [1:Flat and Intact] [2:Flat and Intact] [N/A:N/A] Granulation Amount: [1:Small (1-33%)] [2:Medium (34-66%)] [N/A:N/A] Granulation Quality: [1:N/A] [2:Red] [N/A:N/A] Necrotic Amount: [1:Large (67-100%)] [2:Medium (34-66%)] [N/A:N/A] Necrotic Tissue: [1:Eschar] [2:Adherent Slough] [N/A:N/A] Exposed Structures: Fascia: No Fascia: No N/A Fat: No Fat: No Tendon: No Tendon: No Muscle: No Muscle: No Joint: No Joint: No Bone: No Bone: No Limited to Skin Limited to Skin Breakdown Breakdown Epithelialization: None None N/A Periwound Skin Texture: Edema: No Edema: No N/A Excoriation: No Excoriation: No Induration: No Induration: No Callus: No Callus: No Crepitus: No Crepitus: No Fluctuance: No Fluctuance: No Friable: No Friable: No Rash: No Rash: No Scarring: No Scarring: No Periwound Skin Dry/Scaly: Yes Moist: Yes N/A Moisture: Maceration: No Maceration: No Moist: No Dry/Scaly: No Periwound Skin Color: Atrophie Blanche: No Atrophie Blanche: No N/A Cyanosis: No Cyanosis: No Ecchymosis: No Ecchymosis: No Erythema: No Erythema: No Hemosiderin Staining: No Hemosiderin Staining: No Mottled: No Mottled: No Pallor: No Pallor: No Rubor: No Rubor: No Temperature: No Abnormality No Abnormality N/A Tenderness on No No N/A Palpation: Wound Preparation: Ulcer Cleansing: Ulcer Cleansing: N/A Rinsed/Irrigated with Rinsed/Irrigated with Saline Saline Topical Anesthetic Topical Anesthetic Applied: Other: lidocaine Applied: Other: lidocaine 4% 4% Treatment Notes Electronic Signature(s) Signed:  11/27/2014 5:11:11 PM By: Gretta Cool, RN, BSN, Kim RN, BSN Entered By: Gretta Cool, RN, BSN, Kim on 11/27/2014 10:34:44 Cheyenne Riggs ([id number]) -------------------------------------------------------------------------------- [place] Details Patient Name: Cheyenne Riggs. Date of Service: 11/27/2014 10:15 AM Medical Record Number: [id number] Patient Account Number: [id number] Date of Birth/Sex: 05-24-1945 (68 y.o. Female) Treating RN: Cornell Barman Primary Care Physician: Hulan Fess Other Clinician: Referring Physician: Hulan Fess Treating Physician/Extender: Frann Rider in Treatment: 3 Active Inactive Electronic Signature(s) Signed: 12/01/2014 2:14:46 PM By: Gretta Cool RN, BSN, Kim RN, BSN Previous Signature: 11/27/2014 5:11:11 PM Version By: Gretta Cool RN, BSN, Kim RN, BSN Entered By: Gretta Cool, RN, BSN, Kim on 12/01/2014 17:49:44 Cheyenne Riggs ([id number]) -------------------------------------------------------------------------------- Pain Assessment Details Patient Name: Cheyenne Riggs. Date of Service: 11/27/2014 10:15 AM Medical Record Number: [id number] Patient Account Number: [id number] Date of Birth/Sex: 1945-10-20 (68 y.o. Female) Treating RN: Cornell Barman Primary Care Physician: Hulan Fess Other Clinician: Referring Physician: Hulan Fess Treating Physician/Extender: Frann Rider in Treatment: 3 Active Problems Location of Pain Severity and Description of Pain Patient Has Paino No Site Locations Pain Management and Medication Current Pain Management: Electronic Signature(s) Signed: 11/27/2014 5:11:11 PM By: Gretta Cool, RN, BSN, Kim  RN, BSN Entered By: Gretta Cool, RN, BSN, Kim on 11/27/2014 10:19:58 Cheyenne Riggs ([id number]) -------------------------------------------------------------------------------- Patient/Caregiver Education Details Patient Name: Cheyenne Riggs. Date of Service: 11/27/2014 10:15 AM Medical Record Number: [id number] Patient Account  Number: [id number] Date of Birth/Gender: 1946/05/07 (68 y.o. Female) Treating RN: Cornell Barman Primary Care Physician: Hulan Fess Other Clinician: Referring Physician: Hulan Fess Treating Physician/Extender: Frann Rider in Treatment: 3 Education Assessment Education Provided To: Patient Education Topics Provided Wound Debridement: Handouts: Wound Debridement, Other: continue wound care as prescribed. Electronic Signature(s) Signed: 11/27/2014 5:11:11 PM By: Gretta Cool, RN, BSN, Kim RN, BSN Entered By: Gretta Cool, RN, BSN, Kim on 11/27/2014 10:54:14 Cheyenne Riggs ([id number]) -------------------------------------------------------------------------------- Wound Assessment Details Patient Name: Cheyenne Riggs. Date of Service: 11/27/2014 10:15 AM Medical Record Number: [id number] Patient Account Number: [id number] Date of Birth/Sex: 28-Mar-1946 (68 y.o. Female) Treating RN: Cornell Barman Primary Care Physician: Hulan Fess Other Clinician: Referring Physician: Hulan Fess Treating Physician/Extender: Frann Rider in Treatment: 3 Wound Status Wound Number: 1 Primary Pressure Ulcer Etiology: Wound Location: Left Calcaneous Wound Healed - Epithelialized Wounding Event: Pressure Injury Status: Date Acquired: 08/18/2014 Comorbid Arrhythmia, Congestive Heart Failure, Weeks Of Treatment: 3 History: Hypertension, Peripheral Venous Clustered Wound: No Disease, Type II Diabetes, Neuropathy, Received Chemotherapy Photos Photo Uploaded By: Gretta Cool, RN, BSN, Kim on 11/27/2014 17:51:09 Wound Measurements Length: (cm) 0 % Reduction Width: (cm) 0 % Reduction Depth: (cm) 0 Epithelializ Area: (cm) 0 Volume: (cm) 0 in Area: 100% in Volume: 100% ation: None Wound Description Classification: Category/Stage II Foul Odor A Diabetic Severity (Wagner): Grade 1 Wound Margin: Flat and Intact Exudate Amount: Small Exudate Type: Serous Exudate Color: amber fter Cleansing:  No Wound Bed Granulation Amount: Small (1-33%) Exposed Structure Necrotic Amount: Large (67-100%) Fascia Exposed: No Karapetian, Margarit P. ([id number]) Necrotic Quality: Eschar Fat Layer Exposed: No Tendon Exposed: No Muscle Exposed: No Joint Exposed: No Bone Exposed: No Limited to Skin Breakdown Periwound Skin Texture Texture Color No Abnormalities Noted: No No Abnormalities Noted: No Callus: No Atrophie Blanche: No Crepitus: No Cyanosis: No Excoriation: No Ecchymosis: No Fluctuance: No Erythema: No Friable: No Hemosiderin Staining: No Induration: No Mottled: No Localized Edema: No Pallor: No Rash: No Rubor: No Scarring: No Temperature / Pain Moisture Temperature: No Abnormality No Abnormalities Noted: No Dry / Scaly: Yes Maceration: No Moist: No Wound Preparation Ulcer Cleansing: Rinsed/Irrigated with Saline Topical Anesthetic Applied: Other: lidocaine 4%, Electronic Signature(s) Signed: 11/27/2014 5:11:11 PM By: Gretta Cool, RN, BSN, Kim RN, BSN Entered By: Gretta Cool, RN, BSN, Kim on 11/27/2014 10:34:32 Cheyenne Riggs ([id number]) -------------------------------------------------------------------------------- Wound Assessment Details Patient Name: Cheyenne Riggs. Date of Service: 11/27/2014 10:15 AM Medical Record Number: [id number] Patient Account Number: [id number] Date of Birth/Sex: Oct 08, 1945 (68 y.o. Female) Treating RN: Cornell Barman Primary Care Physician: Hulan Fess Other Clinician: Referring Physician: Hulan Fess Treating Physician/Extender: Frann Rider in Treatment: 3 Wound Status Wound Number: 2 Primary Diabetic Wound/Ulcer of the Lower Etiology: Extremity Wound Location: Left Toe Great Wound Open Wounding Event: Gradually Appeared Status: Date Acquired: 08/18/2014 Comorbid Arrhythmia, Congestive Heart Failure, Weeks Of Treatment: 3 History: Hypertension, Peripheral Venous Clustered Wound: No Disease, Type II Diabetes, Neuropathy, Received  Chemotherapy Photos Photo Uploaded By: Gretta Cool, RN, BSN, Kim on 11/27/2014 17:51:09 Wound Measurements Length: (cm) 1.7 % Reduction in Width: (cm) 0.8 % Reduction in Depth: (cm) 0.3 Epithelializati Area: (cm) 1.068 Volume: (cm) 0.32 Area: 68% Volume: 68% on: None Wound Description Classification: Grade 2 Foul Odor After Wound Margin: Flat and Intact Exudate Amount: Small  Exudate Type: Serous Exudate Color: amber Cleansing: No Wound Bed Granulation Amount: Medium (34-66%) Exposed Structure Granulation Quality: Red Fascia Exposed: No Necrotic Amount: Medium (34-66%) Fat Layer Exposed: No TAMIEKA, RANCOURT. ([id number]) Necrotic Quality: Adherent Slough Tendon Exposed: No Muscle Exposed: No Joint Exposed: No Bone Exposed: No Limited to Skin Breakdown Periwound Skin Texture Texture Color No Abnormalities Noted: No No Abnormalities Noted: No Callus: No Atrophie Blanche: No Crepitus: No Cyanosis: No Excoriation: No Ecchymosis: No Fluctuance: No Erythema: No Friable: No Hemosiderin Staining: No Induration: No Mottled: No Localized Edema: No Pallor: No Rash: No Rubor: No Scarring: No Temperature / Pain Moisture Temperature: No Abnormality No Abnormalities Noted: No Dry / Scaly: No Maceration: No Moist: Yes Wound Preparation Ulcer Cleansing: Rinsed/Irrigated with Saline Topical Anesthetic Applied: Other: lidocaine 4%, Electronic Signature(s) Signed: 11/27/2014 5:11:11 PM By: Gretta Cool, RN, BSN, Kim RN, BSN Entered By: Gretta Cool, RN, BSN, Kim on 11/27/2014 10:24:53 Cheyenne Riggs ([id number]) -------------------------------------------------------------------------------- Vitals Details Patient Name: Cheyenne Riggs. Date of Service: 11/27/2014 10:15 AM Medical Record Number: [id number] Patient Account Number: [id number] Date of Birth/Sex: 07/03/1945 (68 y.o. Female) Treating RN: Cornell Barman Primary Care Physician: Hulan Fess Other Clinician: Referring Physician:  Hulan Fess Treating Physician/Extender: Frann Rider in Treatment: 3 Vital Signs Time Taken: 10:19 Temperature (F): 97.7 Height (in): 63 Pulse (bpm): 64 Weight (lbs): 157 Respiratory Rate (breaths/min): 18 Body Mass Index (BMI): 27.8 Blood Pressure (mmHg): 168/79 Reference Range: 80 - 120 mg / dl Electronic Signature(s) Signed: 11/27/2014 5:11:11 PM By: Gretta Cool, RN, BSN, Kim RN, BSN Entered By: Gretta Cool, RN, BSN, Kim on 11/27/2014 10:20:16

## 2014-11-30 ENCOUNTER — Encounter (HOSPITAL_COMMUNITY): Payer: Self-pay | Admitting: *Deleted

## 2014-11-30 MED ORDER — DEXTROSE 5 % IV SOLN
1.5000 g | INTRAVENOUS | Status: DC
  Filled 2014-11-30: qty 1.5

## 2014-11-30 MED ORDER — SODIUM CHLORIDE 0.9 % IV SOLN
INTRAVENOUS | Status: DC
  Administered 2014-12-01: 11:00:00 via INTRAVENOUS

## 2014-12-01 ENCOUNTER — Ambulatory Visit (HOSPITAL_COMMUNITY): Payer: Medicare Other | Admitting: Certified Registered Nurse Anesthetist

## 2014-12-01 ENCOUNTER — Encounter (HOSPITAL_COMMUNITY): Payer: Self-pay | Admitting: *Deleted

## 2014-12-01 ENCOUNTER — Encounter (HOSPITAL_COMMUNITY)
Admission: RE | Disposition: A | Discharge status: Home / Self Care | Payer: Self-pay | Source: Ambulatory Visit | Attending: Vascular Surgery

## 2014-12-01 ENCOUNTER — Ambulatory Visit (HOSPITAL_COMMUNITY)
Admission: RE | Admit: 2014-12-01 | Discharge: 2014-12-01 | Disposition: A | Discharge status: Home / Self Care | Payer: Medicare Other | Source: Ambulatory Visit | Attending: Vascular Surgery | Admitting: Vascular Surgery

## 2014-12-01 DIAGNOSIS — N289 Disorder of kidney and ureter, unspecified: Secondary | ICD-10-CM | POA: Diagnosis not present

## 2014-12-01 DIAGNOSIS — Z79899 Other long term (current) drug therapy: Secondary | ICD-10-CM | POA: Insufficient documentation

## 2014-12-01 DIAGNOSIS — I4891 Unspecified atrial fibrillation: Secondary | ICD-10-CM | POA: Insufficient documentation

## 2014-12-01 DIAGNOSIS — Z9221 Personal history of antineoplastic chemotherapy: Secondary | ICD-10-CM | POA: Diagnosis not present

## 2014-12-01 DIAGNOSIS — F419 Anxiety disorder, unspecified: Secondary | ICD-10-CM | POA: Diagnosis not present

## 2014-12-01 DIAGNOSIS — I748 Embolism and thrombosis of other arteries: Secondary | ICD-10-CM | POA: Insufficient documentation

## 2014-12-01 DIAGNOSIS — I509 Heart failure, unspecified: Secondary | ICD-10-CM | POA: Diagnosis not present

## 2014-12-01 DIAGNOSIS — I10 Essential (primary) hypertension: Secondary | ICD-10-CM | POA: Insufficient documentation

## 2014-12-01 DIAGNOSIS — Z7901 Long term (current) use of anticoagulants: Secondary | ICD-10-CM | POA: Insufficient documentation

## 2014-12-01 DIAGNOSIS — F329 Major depressive disorder, single episode, unspecified: Secondary | ICD-10-CM | POA: Diagnosis not present

## 2014-12-01 DIAGNOSIS — Z87891 Personal history of nicotine dependence: Secondary | ICD-10-CM | POA: Insufficient documentation

## 2014-12-01 DIAGNOSIS — M86672 Other chronic osteomyelitis, left ankle and foot: Secondary | ICD-10-CM | POA: Diagnosis not present

## 2014-12-01 DIAGNOSIS — M868X7 Other osteomyelitis, ankle and foot: Secondary | ICD-10-CM | POA: Insufficient documentation

## 2014-12-01 DIAGNOSIS — I471 Supraventricular tachycardia: Secondary | ICD-10-CM | POA: Diagnosis not present

## 2014-12-01 DIAGNOSIS — Z853 Personal history of malignant neoplasm of breast: Secondary | ICD-10-CM | POA: Diagnosis not present

## 2014-12-01 DIAGNOSIS — I998 Other disorder of circulatory system: Secondary | ICD-10-CM | POA: Diagnosis not present

## 2014-12-01 HISTORY — DX: Acute kidney failure, unspecified: N17.9

## 2014-12-01 HISTORY — DX: Supraventricular tachycardia: I47.1

## 2014-12-01 HISTORY — DX: Thrombocytopenia, unspecified: D69.6

## 2014-12-01 HISTORY — DX: Zoster without complications: B02.9

## 2014-12-01 HISTORY — DX: Heart failure, unspecified: I50.9

## 2014-12-01 HISTORY — DX: Pneumonia, unspecified organism: J18.9

## 2014-12-01 HISTORY — DX: Rhabdomyolysis: M62.82

## 2014-12-01 HISTORY — DX: Personal history of other medical treatment: Z92.89

## 2014-12-01 HISTORY — PX: AMPUTATION: SHX166

## 2014-12-01 HISTORY — DX: Personal history of urinary calculi: Z87.442

## 2014-12-01 HISTORY — DX: Cardiac arrhythmia, unspecified: I49.9

## 2014-12-01 LAB — COMPREHENSIVE METABOLIC PANEL
ALBUMIN: 3.4 g/dL — AB (ref 3.5–5.0)
ALT: 11 U/L — ABNORMAL LOW (ref 14–54)
AST: 17 U/L (ref 15–41)
Alkaline Phosphatase: 81 U/L (ref 38–126)
Anion gap: 8 (ref 5–15)
BILIRUBIN TOTAL: 1.2 mg/dL (ref 0.3–1.2)
BUN: 16 mg/dL (ref 6–20)
CO2: 25 mmol/L (ref 22–32)
Calcium: 9.4 mg/dL (ref 8.9–10.3)
Chloride: 104 mmol/L (ref 101–111)
Creatinine, Ser: 1.11 mg/dL — ABNORMAL HIGH (ref 0.44–1.00)
GFR calc non Af Amer: 50 mL/min — ABNORMAL LOW (ref >60)
GFR, EST AFRICAN AMERICAN: 58 mL/min — AB (ref >60)
GLUCOSE: 90 mg/dL (ref 65–99)
Potassium: 3.9 mmol/L (ref 3.5–5.1)
SODIUM: 137 mmol/L (ref 135–145)
Total Protein: 6.9 g/dL (ref 6.5–8.1)

## 2014-12-01 LAB — CBC
HCT: 37.4 % (ref 36.0–46.0)
Hemoglobin: 12.3 g/dL (ref 12.0–15.0)
MCH: 28.5 pg (ref 26.0–34.0)
MCHC: 32.9 g/dL (ref 30.0–36.0)
MCV: 86.6 fL (ref 78.0–100.0)
Platelets: 175 10*3/uL (ref 150–400)
RBC: 4.32 MIL/uL (ref 3.87–5.11)
RDW: 15.9 % — AB (ref 11.5–15.5)
WBC: 5.3 10*3/uL (ref 4.0–10.5)

## 2014-12-01 LAB — SURGICAL PCR SCREEN
MRSA, PCR: NEGATIVE
STAPHYLOCOCCUS AUREUS: POSITIVE — AB

## 2014-12-01 LAB — GLUCOSE, CAPILLARY: Glucose-Capillary: 90 mg/dL (ref 65–99)

## 2014-12-01 SURGERY — AMPUTATION DIGIT
Anesthesia: Monitor Anesthesia Care | Site: Toe | Laterality: Left

## 2014-12-01 MED ORDER — SODIUM CHLORIDE 0.9 % IV SOLN: INTRAVENOUS | Status: DC

## 2014-12-01 MED ORDER — MEPIVACAINE HCL 1.5 % IJ SOLN
INTRAMUSCULAR | Status: DC | PRN
  Administered 2014-12-01: 20 mL via PERINEURAL

## 2014-12-01 MED ORDER — MIDAZOLAM HCL 2 MG/2ML IJ SOLN
INTRAMUSCULAR | Status: AC
  Administered 2014-12-01: 1 mg
  Filled 2014-12-01: qty 2

## 2014-12-01 MED ORDER — OXYCODONE-ACETAMINOPHEN 5-325 MG PO TABS: 1.0000 | ORAL_TABLET | Freq: Four times a day (QID) | ORAL | Status: DC | PRN

## 2014-12-01 MED ORDER — BACITRACIN ZINC 500 UNIT/GM EX OINT
TOPICAL_OINTMENT | CUTANEOUS | Status: DC | PRN
  Administered 2014-12-01: 1 via TOPICAL

## 2014-12-01 MED ORDER — MIDAZOLAM HCL 2 MG/2ML IJ SOLN
1.0000 mg | Freq: Once | INTRAMUSCULAR | Status: DC
  Filled 2014-12-01: qty 1

## 2014-12-01 MED ORDER — PHENYLEPHRINE HCL 10 MG/ML IJ SOLN
INTRAMUSCULAR | Status: DC | PRN
  Administered 2014-12-01: 120 ug via INTRAVENOUS

## 2014-12-01 MED ORDER — FENTANYL CITRATE (PF) 100 MCG/2ML IJ SOLN
50.0000 ug | Freq: Once | INTRAMUSCULAR | Status: AC
  Administered 2014-12-01: 50 ug via INTRAVENOUS
  Filled 2014-12-01: qty 1

## 2014-12-01 MED ORDER — PROPOFOL INFUSION 10 MG/ML OPTIME
INTRAVENOUS | Status: DC | PRN
  Administered 2014-12-01: 120 ug/kg/min via INTRAVENOUS

## 2014-12-01 MED ORDER — CHLORHEXIDINE GLUCONATE CLOTH 2 % EX PADS: 6.0000 | MEDICATED_PAD | Freq: Once | CUTANEOUS | Status: DC

## 2014-12-01 MED ORDER — MUPIROCIN 2 % EX OINT: 1.0000 "application " | TOPICAL_OINTMENT | Freq: Once | CUTANEOUS | Status: DC

## 2014-12-01 MED ORDER — OXYCODONE-ACETAMINOPHEN 5-325 MG PO TABS: 1.0000 | ORAL_TABLET | ORAL | Status: DC | PRN

## 2014-12-01 MED ORDER — FENTANYL CITRATE (PF) 250 MCG/5ML IJ SOLN
INTRAMUSCULAR | Status: AC
  Filled 2014-12-01: qty 5

## 2014-12-01 MED ORDER — BACITRACIN ZINC 500 UNIT/GM EX OINT
TOPICAL_OINTMENT | CUTANEOUS | Status: AC
  Filled 2014-12-01: qty 28.35

## 2014-12-01 MED ORDER — MUPIROCIN 2 % EX OINT
TOPICAL_OINTMENT | CUTANEOUS | Status: AC
  Administered 2014-12-01: 1
  Filled 2014-12-01: qty 22

## 2014-12-01 MED ORDER — FENTANYL CITRATE (PF) 100 MCG/2ML IJ SOLN
INTRAMUSCULAR | Status: AC
  Filled 2014-12-01: qty 2

## 2014-12-01 MED ORDER — SUCCINYLCHOLINE CHLORIDE 20 MG/ML IJ SOLN
INTRAMUSCULAR | Status: AC
  Filled 2014-12-01: qty 1

## 2014-12-01 MED ORDER — 0.9 % SODIUM CHLORIDE (POUR BTL) OPTIME
TOPICAL | Status: DC | PRN
Start: 2014-12-01 — End: 2014-12-01
  Administered 2014-12-01: 1000 mL

## 2014-12-01 SURGICAL SUPPLY — 34 items
BANDAGE ELASTIC 4 VELCRO ST LF (GAUZE/BANDAGES/DRESSINGS) ×3 IMPLANT
BLADE AVERAGE 25MMX9MM (BLADE)
BLADE AVERAGE 25X9 (BLADE) IMPLANT
BNDG CONFORM 3 STRL LF (GAUZE/BANDAGES/DRESSINGS) ×3 IMPLANT
BNDG GAUZE ELAST 4 BULKY (GAUZE/BANDAGES/DRESSINGS) ×3 IMPLANT
CANISTER SUCTION 2500CC (MISCELLANEOUS) ×3 IMPLANT
COVER SURGICAL LIGHT HANDLE (MISCELLANEOUS) ×3 IMPLANT
DRAPE EXTREMITY T 121X128X90 (DRAPE) ×3 IMPLANT
ELECT REM PT RETURN 9FT ADLT (ELECTROSURGICAL) ×3
ELECTRODE REM PT RTRN 9FT ADLT (ELECTROSURGICAL) ×1 IMPLANT
GAUZE SPONGE 4X4 12PLY STRL (GAUZE/BANDAGES/DRESSINGS) ×3 IMPLANT
GLOVE BIO SURGEON STRL SZ 6.5 (GLOVE) ×1 IMPLANT
GLOVE BIO SURGEON STRL SZ7.5 (GLOVE) ×3 IMPLANT
GLOVE BIO SURGEONS STRL SZ 6.5 (GLOVE) ×1
GLOVE BIOGEL PI IND STRL 6.5 (GLOVE) IMPLANT
GLOVE BIOGEL PI IND STRL 8 (GLOVE) ×1 IMPLANT
GLOVE BIOGEL PI INDICATOR 6.5 (GLOVE) ×4
GLOVE BIOGEL PI INDICATOR 8 (GLOVE) ×2
GLOVE ECLIPSE 6.5 STRL STRAW (GLOVE) ×2 IMPLANT
GOWN BRE IMP SLV AUR XL STRL (GOWN DISPOSABLE) ×2 IMPLANT
GOWN STRL REUS W/ TWL LRG LVL3 (GOWN DISPOSABLE) ×3 IMPLANT
GOWN STRL REUS W/TWL LRG LVL3 (GOWN DISPOSABLE) ×9
KIT BASIN OR (CUSTOM PROCEDURE TRAY) ×3 IMPLANT
KIT ROOM TURNOVER OR (KITS) ×3 IMPLANT
NS IRRIG 1000ML POUR BTL (IV SOLUTION) ×3 IMPLANT
PACK GENERAL/GYN (CUSTOM PROCEDURE TRAY) ×3 IMPLANT
PAD ARMBOARD 7.5X6 YLW CONV (MISCELLANEOUS) ×6 IMPLANT
SPECIMEN JAR SMALL (MISCELLANEOUS) ×3 IMPLANT
SPONGE GAUZE 4X4 12PLY STER LF (GAUZE/BANDAGES/DRESSINGS) ×2 IMPLANT
SUT ETHILON 3 0 PS 1 (SUTURE) ×3 IMPLANT
SWAB COLLECTION DEVICE MRSA (MISCELLANEOUS) IMPLANT
TUBE ANAEROBIC SPECIMEN COL (MISCELLANEOUS) IMPLANT
UNDERPAD 30X30 INCONTINENT (UNDERPADS AND DIAPERS) ×3 IMPLANT
WATER STERILE IRR 1000ML POUR (IV SOLUTION) ×3 IMPLANT

## 2014-12-01 NOTE — Transfer of Care (Signed)
Immediate Anesthesia Transfer of Care Note  Patient: Cheyenne Riggs  Procedure(s) Performed: Procedure(s): AMPUTATION Left GREAT TOE (Left)  Patient Location: PACU  Anesthesia Type:MAC combined with regional for post-op pain  Level of Consciousness: awake, alert  and oriented  Airway & Oxygen Therapy: Patient Spontanous Breathing  Post-op Assessment: Report given to RN and Post -op Vital signs reviewed and stable  Post vital signs: Reviewed and stable  Last Vitals:  Filed Vitals:   12/01/14 1330  BP: 123/52  Pulse: 65  Temp:   Resp: 15    Complications: No apparent anesthesia complications

## 2014-12-01 NOTE — Anesthesia Procedure Notes (Addendum)
Procedure Name: MAC Date/Time: 12/01/2014 12:44 PM Performed by: [place] Pink Pre-anesthesia Checklist: Patient identified, Timeout performed, Emergency Drugs available, Suction available and Patient being monitored Patient Re-evaluated:Patient Re-evaluated prior to inductionOxygen Delivery Method: Simple face mask Intubation Type: IV induction Placement Confirmation: positive ETCO2   Anesthesia Regional Block:  Ankle block  Pre-Anesthetic Checklist: ,, timeout performed, Correct Patient, Correct Site, Correct Laterality, Correct Procedure,, site marked, risks and benefits discussed, Surgical consent, Pre-op evaluation,  At surgeon's request  Laterality: Left  Prep: chloraprep       Needles:  Injection technique: Single-shot      Needle Gauge: 25 and 25 G    Additional Needles: Ankle block Narrative:  Start time: 12/01/2014 12:15 PM End time: 12/01/2014 12:25 PM  Performed by: Personally  Anesthesiologist: Suzette Battiest  Additional Notes: A functioning IV was confirmed and monitors were applied.  Sterile prep and drape, hand hygiene and sterile gloves were used.  Negative aspiration and test dose prior to incremental administration of local anesthetic using the 25 ga needle. 5 ports used.  The patient tolerated the procedure well.

## 2014-12-01 NOTE — Interval H&P Note (Signed)
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 After consideration of risks, benefits and other options for treatment, the patient has consented to  Procedure(s): AMPUTATION GREAT TOE (Left) as a surgical intervention .  The patient's history has been reviewed, patient examined, no change in status, stable for surgery.  I have reviewed the patient's chart and labs.  Questions were answered to the patient's satisfaction.     Deitra Mayo

## 2014-12-01 NOTE — H&P (View-Only) (Signed)
[facility] [place]  Patient name: Cheyenne Riggs MRN: [id number] DOB: 1946-01-05 Sex: female  REASON FOR VISIT: Follow up of ischemic toes.  HPI: HILIANA Riggs is a 69 y.o. female who I saw in consultation in the hospital on 09/03/2014. She has a history of breast cancer and had undergone chemotherapy. She was admitted back in April with fatigue. He decided to stop taking her chemotherapy as she simply did not feel that she could tolerate this any more. During the admission she was noted to have ischemic toes and vascular surgery was consult. She was diagnosed with atrial fibrillation during that admission and started on Coumadin. Also had a CT scan of the abdomen and pelvis that admission which showed evidence of aortoiliac occlusive disease. I felt that this could potentially be a source of embolization. Previous ABIs were 100% bilaterally although these could be falsely elevated. In order to rule out a source of embolization I recommended that we proceed with arteriography.  She underwent an arteriogram on 09/28/2014. This showed that the infrarenal aorta, bilateral common iliac arteries, bilateral external iliac arteries and bilateral internal iliac arteries were patent. On the right side the dorsalis pedis artery was occluded but there was no other significant disease. On the left side the posterior tibial and peroneal arteries were occluded distally. Based on these findings, I felt that the patient had adequate circulation to heal the wounds on the left heel and left great toe. The wounds on the right foot had essentially healed. I recommended a follow up visit in 6-8 weeks.  Since I saw her last she continues to have some tightness in her feet but the wounds on the left foot have improved. She denies fever or chills. She denies significant drainage from the wounds on her left foot. The wounds on her right foot have been healed.  Past Medical History  Diagnosis Date  .  Anxiety   . Back pain   . H/O bladder problems   . Cholelithiasis   . Depression   . High blood pressure   . Hypercholesteremia   . Kidney stones   . Gum disease   . Complication of anesthesia     bp goes up  . Wears glasses   . Wears dentures     top  . Kidney stones   . Cancer   . Breast cancer    Family History  Problem Relation Age of Onset  . Stroke Mother     onset in her 62's  . Hypertension Mother   . Hyperlipidemia Mother   . Cancer - Other Father     eye cancer, black lung diseae  . Cancer Father   . Heart failure Sister   . Hypertension Sister   . Hypertension Brother   . Heart attack Neg Hx    SOCIAL HISTORY: History  Substance Use Topics  . Smoking status: Former Smoker -- 0.00 packs/day    Quit date: 05/16/2014  . Smokeless tobacco: Not on file  . Alcohol Use: No   Allergies  Allergen Reactions  . Ambien [Zolpidem Tartrate] Other (See Comments)    Per Pt: causes sleepwalking, makes loopy.  . Ciprofloxacin Nausea And Vomiting  . Demerol [Meperidine] Hives  . Lidocaine     palpatations  . Other Swelling    Eye ointments  . Oxycodone Other (See Comments)    Pt states that it makes her feel "loopy and crazy"  . Septra [Sulfamethoxazole-Trimethoprim] Nausea And Vomiting  .  Codeine Rash  . Novocain [Procaine] Palpitations  . Vancomycin Rash   Current Outpatient Prescriptions  Medication Sig Dispense Refill  . apixaban (ELIQUIS) 5 MG TABS tablet Take 1 tablet (5 mg total) by mouth 2 (two) times daily. 60 tablet 11  . diphenhydrAMINE (BENADRYL) 25 MG tablet Take 25 mg by mouth daily as needed for allergies (allergies).     Marland Kitchen FLUoxetine (PROZAC) 10 MG capsule Take 1 capsule (10 mg total) by mouth daily. 30 capsule 1  . furosemide (LASIX) 40 MG tablet Take 1 tablet (40 mg total) by mouth daily. 30 tablet 0  . gabapentin (NEURONTIN) 100 MG capsule Take 1 capsule (100 mg total) by mouth 2 (two) times daily.    Marland Kitchen LORazepam (ATIVAN) 0.5 MG tablet TAKE 1  TABLET BY MOUTH EVERY 8 HOURS AS NEEDED NAUSEA 30 tablet 0  . metoprolol tartrate (LOPRESSOR) 25 MG tablet Take 1 tablet (25 mg total) by mouth 2 (two) times daily. 60 tablet 3  . oxyCODONE-acetaminophen (PERCOCET/ROXICET) 5-325 MG per tablet Take by mouth every 4 (four) hours as needed for severe pain.    . potassium chloride (MICRO-K) 10 MEQ CR capsule Take 1 capsule (10 mEq total) by mouth 2 (two) times daily. 180 capsule 3  . Probiotic Product (PROBIOTIC DAILY PO) Take 1 tablet by mouth daily.      No current facility-administered medications for this visit.   REVIEW OF SYSTEMS: Valu.Nieves ] denotes positive finding; [  ] denotes negative finding  CARDIOVASCULAR:  [ ]  chest pain   [ ]  chest pressure   [ ]  palpitations   [ ]  orthopnea   [ ]  dyspnea on exertion   Valu.Nieves ] claudication   [ ]  rest pain   [ ]  DVT   [ ]  phlebitis PULMONARY:   [ ]  productive cough   [ ]  asthma   [ ]  wheezing NEUROLOGIC:   [ ]  weakness  [ ]  paresthesias  [ ]  aphasia  [ ]  amaurosis  [ ]  dizziness HEMATOLOGIC:   [ ]  bleeding problems   [ ]  clotting disorders MUSCULOSKELETAL:  [ ]  joint pain   [ ]  joint swelling Valu.Nieves ] leg swelling GASTROINTESTINAL: [ ]   blood in stool  [ ]   hematemesis GENITOURINARY:  [ ]   dysuria  [ ]   hematuria PSYCHIATRIC:  [ ]  history of major depression INTEGUMENTARY:  [ ]  rashes  [ ]  ulcers CONSTITUTIONAL:  [ ]  fever   [ ]  chills  PHYSICAL EXAM: Filed Vitals:   11/11/14 1544 11/11/14 1556  BP: 150/69 160/57  Pulse: 89 81  Resp: 16   Height: 5\' 3"  (1.6 m)   Weight: 159 lb (72.122 kg)    GENERAL: The patient is a well-nourished female, in no acute distress. The vital signs are documented above. CARDIOVASCULAR: There is a regular rate and rhythm. She has palpable femoral pulses. Both feet appear adequately perfused. PULMONARY: There is good air exchange bilaterally without wheezing or rales. ABDOMEN: Soft and non-tender with normal pitched bowel sounds.  MUSCULOSKELETAL: There are no major  deformities or cyanosis. NEUROLOGIC: No focal weakness or paresthesias are detected. SKIN: She has dry gangrene on the tip of the left great toe. There is a superficial ulcer on her posterior left heel. PSYCHIATRIC: The patient has a normal affect.  DATA:  I have reviewed her arteriogram. The results are described above.  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 She has excellent circulation on the right with the only finding on her arteriogram being an occluded dorsalis pedis artery distally. On the left side, the wound on the left great toe appears to be healing gradually as does the wound on the heel. 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 The anterior tibial and dorsalis pedis arteries are patent on the left. Thus there were no options for revascularization on the left although I think she has adequate circulation to heal the wounds. She is followed in the wound care center and for this reason I did not think it was necessary to follow the wounds here also. I'll be happy to see her back at any time if the wounds stop making progress. She could be considered for toe amputation of the left great toe if the tip of the toe does not auto-amputate.  I will see her back prn.  HYPERTENSION: The patient's initial blood pressure today was elevated. We repeated this and this was still elevated. We have encouraged the patient to follow up with their primary care physician for management of their blood pressure.    Deitra Mayo [facility] [place]: [phone]

## 2014-12-01 NOTE — Anesthesia Postprocedure Evaluation (Signed)
  Anesthesia Post-op Note  Patient: Cheyenne Riggs  Procedure(s) Performed: Procedure(s): AMPUTATION Left GREAT TOE (Left)  Patient Location: PACU  Anesthesia Type:MAC and Regional  Level of Consciousness: awake, alert  and oriented  Airway and Oxygen Therapy: Patient Spontanous Breathing  Post-op Pain: none  Post-op Assessment: Post-op Vital signs reviewed              Post-op Vital Signs: Reviewed  Last Vitals:  Filed Vitals:   12/01/14 1350  BP: 131/78  Pulse: 64  Temp: 36.2 C  Resp: 16    Complications: No apparent anesthesia complications

## 2014-12-01 NOTE — Anesthesia Preprocedure Evaluation (Addendum)
Anesthesia Evaluation  Patient identified by MRN, date of birth, ID band Patient awake    Reviewed: Allergy & Precautions, NPO status , Patient's Chart, lab work & pertinent test results, reviewed documented beta blocker date and time   Airway Mallampati: III  TM Distance: >3 FB Neck ROM: Full    Dental   Pulmonary former smoker,  breath sounds clear to auscultation        Cardiovascular hypertension, Pt. on home beta blockers and Pt. on medications +CHF (EF 35%) - Orthopnea and - DOE + dysrhythmias Rhythm:Regular Rate:Normal  EF 35%. 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 Spoke with Dr. Cathie Olden, pts cardiologist, and states that he would not rule out future ischemic work up but that likely a result of chemotherapy. He would not delay surgery for ischemic/ infected toe for additional workup.   Neuro/Psych Anxiety Depression    GI/Hepatic negative GI ROS, Neg liver ROS,   Endo/Other  negative endocrine ROS  Renal/GU Renal InsufficiencyRenal disease     Musculoskeletal negative musculoskeletal ROS (+)   Abdominal   Peds  Hematology  (+) anemia ,   Anesthesia Other Findings   Reproductive/Obstetrics                           Anesthesia Physical Anesthesia Plan  ASA: III  Anesthesia Plan: Regional and MAC   Post-op Pain Management:    Induction: Intravenous  Airway Management Planned: Mask and Natural Airway  Additional Equipment:   Intra-op Plan:   Post-operative Plan:   Informed Consent: I have reviewed the patients History and Physical, chart, labs and discussed the procedure including the risks, benefits and alternatives for the proposed anesthesia with the patient or authorized representative who has indicated his/her understanding and acceptance.   Dental advisory given  Plan Discussed with: CRNA  Anesthesia Plan Comments:       Anesthesia Quick  Evaluation

## 2014-12-01 NOTE — Progress Notes (Signed)
Orthopedic Tech Progress Note Patient Details:  Cheyenne Riggs 1946/02/02 [id number]  Ortho Devices Type of Ortho Device: Darco shoe Ortho Device/Splint Location: lle Ortho Device/Splint Interventions: Application   Hildred Priest 12/01/2014, 2:11 PM

## 2014-12-01 NOTE — Op Note (Signed)
    NAME: Cheyenne Riggs    MRN: [id number] DOB: 1946-04-06    DATE OF OPERATION: 12/01/2014  PREOP DIAGNOSIS: osteomyelitis of the left great toe  POSTOP DIAGNOSIS: same  PROCEDURE: amputation of left great toe  SURGEON: Judeth Cornfield. Scot Dock, MD, FACS  ASSIST: none  ANESTHESIA: ankle block   EBL: minimal  INDICATIONS: Cheyenne Riggs is a 69 y.o. female with a nonhealing wound of her left great toe. She had osteomyelitis. Arteriogram showed single vessel runoff via the anterior tibial artery on the left with no options for revascularization but reasonable blood flow. Amputation of the toe was recommended.  FINDINGS: the bone was debridement back to healthy-appearing bone.  TECHNIQUE: The patient was taken to the operating room after receiving an ankle block. The left foot was prepped and draped in the usual sterile fashion. An elliptical incision was made encompassing the left great toe and dissection carried down to the middle phalanx which was divided using a TPS saw. Hemostasis was obtained in the wound and the wound was then closed with interrupted 3-0 nylon sutures. Sterile dressing was applied. The patient tolerated the procedure well was transferred to the recovery room in stable condition. All needle and sponge counts were correct.  Deitra Mayo, MD, FACS [facility]  DATE OF DICTATION:   12/01/2014

## 2014-12-02 ENCOUNTER — Ambulatory Visit: Payer: Medicare Other | Admitting: Vascular Surgery

## 2014-12-02 ENCOUNTER — Encounter (HOSPITAL_BASED_OUTPATIENT_CLINIC_OR_DEPARTMENT_OTHER): Payer: Medicare Other | Attending: Surgery

## 2014-12-02 ENCOUNTER — Telehealth: Payer: Self-pay | Admitting: Cardiovascular Disease

## 2014-12-02 DIAGNOSIS — I129 Hypertensive chronic kidney disease with stage 1 through stage 4 chronic kidney disease, or unspecified chronic kidney disease: Secondary | ICD-10-CM | POA: Insufficient documentation

## 2014-12-02 DIAGNOSIS — M86672 Other chronic osteomyelitis, left ankle and foot: Secondary | ICD-10-CM | POA: Insufficient documentation

## 2014-12-02 DIAGNOSIS — Z9221 Personal history of antineoplastic chemotherapy: Secondary | ICD-10-CM | POA: Insufficient documentation

## 2014-12-02 DIAGNOSIS — Z89412 Acquired absence of left great toe: Secondary | ICD-10-CM | POA: Insufficient documentation

## 2014-12-02 DIAGNOSIS — N189 Chronic kidney disease, unspecified: Secondary | ICD-10-CM | POA: Insufficient documentation

## 2014-12-02 DIAGNOSIS — Z87891 Personal history of nicotine dependence: Secondary | ICD-10-CM | POA: Insufficient documentation

## 2014-12-02 DIAGNOSIS — C50919 Malignant neoplasm of unspecified site of unspecified female breast: Secondary | ICD-10-CM | POA: Insufficient documentation

## 2014-12-02 DIAGNOSIS — L97524 Non-pressure chronic ulcer of other part of left foot with necrosis of bone: Secondary | ICD-10-CM | POA: Insufficient documentation

## 2014-12-02 DIAGNOSIS — E11621 Type 2 diabetes mellitus with foot ulcer: Secondary | ICD-10-CM | POA: Insufficient documentation

## 2014-12-02 DIAGNOSIS — E1122 Type 2 diabetes mellitus with diabetic chronic kidney disease: Secondary | ICD-10-CM | POA: Insufficient documentation

## 2014-12-02 NOTE — Telephone Encounter (Signed)
Will route to Dr. Acie Fredrickson for advisement.

## 2014-12-02 NOTE — Telephone Encounter (Signed)
New message      Pt had a toe amputated yesterday.  When can she restart her eliquis?

## 2014-12-02 NOTE — Telephone Encounter (Signed)
Spoke with patient and reviewed Dr. Elmarie Shiley advice to call surgeon's office for advice to restart Eliquis and that if she does not hear back from them to at least restart by Friday.  Patient verbalized understanding and agreement.

## 2014-12-02 NOTE — Telephone Encounter (Signed)
Restarting her Eliquis is up to the surgeon who amputated her toe. I would suggest that she restart at least by  Friday if she doesn't hear back from the surgeon.

## 2014-12-03 ENCOUNTER — Encounter (HOSPITAL_COMMUNITY): Payer: Self-pay | Admitting: Vascular Surgery

## 2014-12-03 ENCOUNTER — Telehealth: Payer: Self-pay | Admitting: Vascular Surgery

## 2014-12-03 NOTE — Telephone Encounter (Signed)
Spoke with patient re: appt,. dpm

## 2014-12-03 NOTE — Telephone Encounter (Signed)
-----   Message from Mena Goes, RN sent at 12/01/2014  1:34 PM EDT ----- Regarding: Schedule   ----- Message -----    From: Angelia Mould, MD    Sent: 12/01/2014   1:26 PM      To: Vvs Charge Pool Subject: charge and f/u                                 PROCEDURE: amputation of left great toe  SURGEON: Judeth Cornfield. Scot Dock, MD, FACS  ASSIST: none  She will need a follow up visit in 2-3 weeks for suture removal. Thank you. CD

## 2014-12-07 ENCOUNTER — Encounter: Payer: Self-pay | Admitting: Hematology and Oncology

## 2014-12-07 ENCOUNTER — Ambulatory Visit (INDEPENDENT_AMBULATORY_CARE_PROVIDER_SITE_OTHER): Payer: Medicare Other | Admitting: Cardiovascular Disease

## 2014-12-07 ENCOUNTER — Encounter: Payer: Self-pay | Admitting: Cardiovascular Disease

## 2014-12-07 VITALS — BP 150/78 | HR 63 | Ht 63.0 in | Wt 158.8 lb

## 2014-12-07 DIAGNOSIS — L97909 Non-pressure chronic ulcer of unspecified part of unspecified lower leg with unspecified severity: Secondary | ICD-10-CM

## 2014-12-07 DIAGNOSIS — I70299 Other atherosclerosis of native arteries of extremities, unspecified extremity: Secondary | ICD-10-CM

## 2014-12-07 DIAGNOSIS — I5043 Acute on chronic combined systolic (congestive) and diastolic (congestive) heart failure: Secondary | ICD-10-CM | POA: Diagnosis not present

## 2014-12-07 DIAGNOSIS — I480 Paroxysmal atrial fibrillation: Secondary | ICD-10-CM

## 2014-12-07 DIAGNOSIS — I4891 Unspecified atrial fibrillation: Secondary | ICD-10-CM | POA: Insufficient documentation

## 2014-12-07 DIAGNOSIS — I10 Essential (primary) hypertension: Secondary | ICD-10-CM

## 2014-12-07 NOTE — Telephone Encounter (Signed)
Per Dr. Lindi Adie, pt is ok to have port removed as chemo is finished.  Dr. Lindi Adie did suggest she keep the port for awhile for hospitalizations, IVF, etc.    LMOVM - pt to return call to clinic to discuss port removal.

## 2014-12-07 NOTE — Patient Instructions (Signed)
Medication Instructions:  Your physician recommends that you continue on your current medications as directed. Please refer to the Current Medication list given to you today.   Labwork: None Ordered   Testing/Procedures: None Ordered   Follow-Up: Your physician wants you to follow-up in: 6 months with Dr. Nahser.  You will receive a reminder letter in the mail two months in advance. If you don't receive a letter, please call our office to schedule the follow-up appointment.     

## 2014-12-07 NOTE — Progress Notes (Signed)
Cardiology Office Note   Date:  12/07/2014   ID:  Tressy, Kunzman 08/06/1945, MRN [id number]  PCP:  Gennette Pac, MD  Cardiologist:   Thayer Headings, MD   Chief Complaint  Patient presents with  . Follow-up   1. 1. Acute combined systolic and diastolic CHF:  2. Essential HTN:   3. Breast cancer :  4. Atrial fibrillation     Sep 24, 2014 CARLEIGH BUCCIERI is a 69 y.o. female who presents for chronic systolic CHF  She has had many ER visits. Was hospitalized with atrial fib. 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  Looks like embolic disease  . Has developed a pressure sore on left heal . Has seen Gae Gallop who wants to do a peripheral angiogram next week   Has bilateral foot swelling , progresses during the day, improves, / resolves overnight when she is in bed. She was started on Lasix ( but this also eventually resulted in a hospital admission for dehydration   December 07, 2014:  Almas has had a toe amputation since I last saw her . She is now back on Eliquis .  Is feeling well. Walking with the walker ,  Working with PT .  BP is normal at home.  Is a bit higher here - she is having pain in her foot    Past Medical History  Diagnosis Date  . Back pain   . H/O bladder problems   . Cholelithiasis   . Depression   . High blood pressure   . Hypercholesteremia   . Gum disease   . Wears glasses   . Wears dentures     top  . Cancer   . Breast cancer   . History of kidney stones     leaks  . Complication of anesthesia     bp goes up- now on Lopressor  . Dysrhythmia 08/2014    Afib- episode- was cardioverted in ED [facility]  . CHF (congestive heart failure)   . Pneumonia 05/2014  . History of kidney stones     paseed one, still has 2  . Anxiety     Panic attacks - when she was on Chanix- still has anxiety  . History of blood transfusion   . Shingles   . Thrombocytopenia 4/16  . Rhabdomyolysis 08/2014  . SVT  (supraventricular tachycardia) 4/16  . ARF (acute renal failure) 4/16    Past Surgical History  Procedure Laterality Date  . Colon biopsy    . Mouth surgery      implants - denture attatchs to it  . Spine surgery    . Colonoscopy    . Tubal ligation    . Lithotripsy    . Rhinoplasty    . Back surgery  1989    lumb lam  . Breast biopsy Right 02/05/2014    invasive ductal cncer  . Abdominal hysterectomy  1982  . Cholecystectomy  2011    lap choli  . Radioactive seed guided mastectomy with axillary sentinel lymph node biopsy Right 02/27/2014    Procedure: RADIOACTIVE SEED GUIDED RIGHT PARTIAL MASTECTOMY WITH AXILLARY SENTINEL LYMPH NODE BIOPSY;  Surgeon: Stark Klein, MD;  Location: [facility];  Service: General;  Laterality: Right;  . Portacath placement N/A 02/27/2014    Procedure: INSERTION PORT-A-CATH;  Surgeon: Stark Klein, MD;  Location: [facility];  Service: General;  Laterality: N/A;  . Peripheral vascular catheterization N/A 09/28/2014    Procedure: Abdominal Aortogram;  Surgeon: Angelia Mould, MD;  Location: [facility];  Service: Cardiovascular;  Laterality: N/A;  . Amputation Left 12/01/2014    Procedure: AMPUTATION Left GREAT TOE;  Surgeon: Angelia Mould, MD;  Location: [facility];  Service: Vascular;  Laterality: Left;     Current Outpatient Prescriptions  Medication Sig Dispense Refill  . apixaban (ELIQUIS) 5 MG TABS tablet Take 1 tablet (5 mg total) by mouth 2 (two) times daily. 60 tablet 11  . diphenhydrAMINE (BENADRYL) 25 MG tablet Take 25 mg by mouth daily as needed for allergies (allergies).     Marland Kitchen FLUoxetine (PROZAC) 10 MG capsule Take 1 capsule (10 mg total) by mouth daily. 30 capsule 1  . furosemide (LASIX) 40 MG tablet Take 1 tablet (40 mg total) by mouth daily. 30 tablet 0  . KLOR-CON M10 10 MEQ tablet     . LORazepam (ATIVAN) 0.5 MG tablet Take 1 tablet (0.5 mg total) by mouth every 8 (eight) hours as needed  for anxiety. 30 tablet 0  . metoprolol tartrate (LOPRESSOR) 25 MG tablet Take 1 tablet (25 mg total) by mouth 2 (two) times daily. 60 tablet 3  . oxyCODONE-acetaminophen (PERCOCET/ROXICET) 5-325 MG per tablet Take 1-2 tablets by mouth every 6 (six) hours as needed. 30 tablet 0  . potassium chloride (MICRO-K) 10 MEQ CR capsule Take 1 capsule (10 mEq total) by mouth 2 (two) times daily. 180 capsule 3  . Probiotic Product (PROBIOTIC DAILY PO) Take 1 tablet by mouth daily.      No current facility-administered medications for this visit.    Allergies:   Ambien; Ciprofloxacin; Demerol; Lidocaine; Other; Septra; Codeine; Novocain; and Vancomycin    Social History:  The patient  reports that she quit smoking about 6 months ago. She does not have any smokeless tobacco history on file. She reports that she does not drink alcohol or use illicit drugs.   Family History:  The patient's family history includes Cancer in her father; Cancer - Other in her father; Heart failure in her sister; Hyperlipidemia in her mother; Hypertension in her brother, mother, and sister; Stroke in her mother. There is no history of Heart attack.    ROS:  Please see the history of present illness.    Review of Systems: Constitutional:  admits to poor appetite   HEENT: denies photophobia, eye pain, redness, hearing loss, ear pain, congestion, sore throat, rhinorrhea, sneezing, neck pain, neck stiffness and tinnitus.  Respiratory: admits to SOB, DOE, cough, chest tightness, and wheezing.  Cardiovascular: admits to   leg swelling.  Gastrointestinal: admits to nausea, vomiting,   Genitourinary: denies dysuria, urgency, frequency, hematuria, flank pain and difficulty urinating.  Musculoskeletal: denies  myalgias, back pain, joint swelling, arthralgias and gait problem.   Skin: denies pallor, rash and wound.  Neurological: denies dizziness, seizures, syncope, weakness, light-headedness, numbness and headaches.   Hematological:  denies adenopathy, easy bruising, personal or family bleeding history.  Psychiatric/ Behavioral: denies suicidal ideation, mood changes, confusion, nervousness, sleep disturbance and agitation.       All other systems are reviewed and negative.    PHYSICAL EXAM: VS:  BP 150/78 mmHg  Pulse 63  Ht 5\' 3"  (1.6 m)  Wt 72.009 kg (158 lb 12 oz)  BMI 28.13 kg/m2  SpO2 97% , BMI Body mass index is 28.13 kg/(m^2). GEN: Well nourished, well developed, in no acute distress, wearing a  cap  HEENT: normal Neck: no JVD, carotid bruits, or masses Cardiac: RRR; no murmurs, rubs, or gallops, 2+ edema  Respiratory:  clear to auscultation bilaterally, normal work of breathing GI: soft, nontender, nondistended, + BS MS:  Left foot is bandaged.  In a boot.  Skin: warm and dry, no rash Neuro:  Strength and sensation are intact Psych: normal   EKG:  EKG is ordered today. The ekg ordered today demonstrates :  NSR at 73.  TWI in inferiolateral leads    Recent Labs: 08/17/2014: B Natriuretic Peptide 1980.9*; TSH 1.792 08/26/2014: Magnesium 1.8 12/01/2014: ALT 11*; BUN 16; Creatinine, Ser 1.11*; Hemoglobin 12.3; Platelets 175; Potassium 3.9; Sodium 137    Lipid Panel No results found for: CHOL, TRIG, HDL, CHOLHDL, VLDL, LDLCALC, LDLDIRECT    Wt Readings from Last 3 Encounters:  12/07/14 72.009 kg (158 lb 12 oz)  12/01/14 70.761 kg (156 lb)  11/26/14 70.988 kg (156 lb 8 oz)      Other studies Reviewed: Additional studies/ records that were reviewed today include: . Review of the above records demonstrates:    ASSESSMENT AND PLAN:  1. Acute combined systolic and diastolic CHF: She is off the  Adriamycin. Her echo in Oct. Showed the initial stages of CHf with a reduced global longitudinal strain of -15.4%.   most recent echo shows EF 35%. She is on metoprolol We started Lisinopril in the hospital several months ago.  It has been stopped - I presume due to worsening renal function. Will hold  off on starting ACE or ARB for now Will continue to follow and adjust BP meds as needed.   2. Essential HTN: She was on Diovan / HCTZ for years but stopped it when her BP dropped ( after she started chemotherapy).  BP has been stable.   3. Breast cancer : Plans per Oncology.    She is now off chemotherapy.  Going to have XRT.   4. Atrial fibrillation:  .  On  Eliquis 5 bid        Current medicines are reviewed at length with the patient today.  The patient does not have concerns regarding medicines.  The following changes have been made:  no change  Labs/ tests ordered today include:  No orders of the defined types were placed in this encounter.     Disposition:   FU with 6 months      Ghislaine Harcum, Wonda Cheng, MD  12/07/2014 9:27 AM    [facility] [address] [place], [place], [place]  [address] Phone: [phone]; Fax: [phone]   [facility]  [address] [place], [place]  [address] [phone]    Fax [phone]

## 2014-12-09 DIAGNOSIS — N189 Chronic kidney disease, unspecified: Secondary | ICD-10-CM | POA: Diagnosis not present

## 2014-12-09 DIAGNOSIS — M86672 Other chronic osteomyelitis, left ankle and foot: Secondary | ICD-10-CM | POA: Diagnosis not present

## 2014-12-09 DIAGNOSIS — C50919 Malignant neoplasm of unspecified site of unspecified female breast: Secondary | ICD-10-CM | POA: Diagnosis not present

## 2014-12-09 DIAGNOSIS — E11621 Type 2 diabetes mellitus with foot ulcer: Secondary | ICD-10-CM | POA: Diagnosis not present

## 2014-12-09 DIAGNOSIS — Z89412 Acquired absence of left great toe: Secondary | ICD-10-CM | POA: Diagnosis not present

## 2014-12-09 DIAGNOSIS — Z87891 Personal history of nicotine dependence: Secondary | ICD-10-CM | POA: Diagnosis not present

## 2014-12-09 DIAGNOSIS — E1122 Type 2 diabetes mellitus with diabetic chronic kidney disease: Secondary | ICD-10-CM | POA: Diagnosis not present

## 2014-12-09 DIAGNOSIS — Z9221 Personal history of antineoplastic chemotherapy: Secondary | ICD-10-CM | POA: Diagnosis not present

## 2014-12-09 DIAGNOSIS — I129 Hypertensive chronic kidney disease with stage 1 through stage 4 chronic kidney disease, or unspecified chronic kidney disease: Secondary | ICD-10-CM | POA: Diagnosis not present

## 2014-12-09 DIAGNOSIS — L97522 Non-pressure chronic ulcer of other part of left foot with fat layer exposed: Secondary | ICD-10-CM | POA: Diagnosis not present

## 2014-12-10 ENCOUNTER — Telehealth: Payer: Self-pay | Admitting: Cardiovascular Disease

## 2014-12-10 NOTE — Telephone Encounter (Signed)
Spoke with pt and she states that Tuesday and Wednesday that she noticed some bright red blood in her stool. Pt states that Monday she was constipated and unable to have BM. Pt states she noticed a large clot after BM. Pt states that bleeding went from bright red to pink yesterday and has had no bleeding at all today. Pt denies pain of any kind. Pt afraid this may be coming from her Eliquis. Will forward to Dr. Acie Fredrickson for review and advisement.

## 2014-12-10 NOTE — Telephone Encounter (Signed)
New message      Pt c/o medication issue:  1. Name of Medication: eliquis 2. How are you currently taking this medication (dosage and times per day)? 5mg  bid 3. Are you having a reaction (difficulty breathing--STAT)? no  4. What is your medication issue?  Pt had blood in her stool for the last 2 days--however, no blood noticed today.  Could it be the eliquis?

## 2014-12-14 ENCOUNTER — Other Ambulatory Visit: Payer: Self-pay | Admitting: *Deleted

## 2014-12-14 DIAGNOSIS — H35033 Hypertensive retinopathy, bilateral: Secondary | ICD-10-CM | POA: Diagnosis not present

## 2014-12-14 DIAGNOSIS — E119 Type 2 diabetes mellitus without complications: Secondary | ICD-10-CM | POA: Diagnosis not present

## 2014-12-14 DIAGNOSIS — H40013 Open angle with borderline findings, low risk, bilateral: Secondary | ICD-10-CM | POA: Diagnosis not present

## 2014-12-14 DIAGNOSIS — H2513 Age-related nuclear cataract, bilateral: Secondary | ICD-10-CM | POA: Diagnosis not present

## 2014-12-14 MED ORDER — OXYCODONE-ACETAMINOPHEN 5-325 MG PO TABS: 1.0000 | ORAL_TABLET | Freq: Three times a day (TID) | ORAL | Status: DC | PRN

## 2014-12-14 NOTE — Telephone Encounter (Signed)
Spoke with pt and informed her that Dr. Acie Fredrickson said the Eliquis may be contributing but he believes that this is some minor bleeding from the constipation. Informed her that Dr. Acie Fredrickson said she should consider Metamucil or a stool softner or consult with PCP if she continues to have issues. Pt denies anymore bleeding. Pt verbalized understanding and was in agreement with this plan.

## 2014-12-14 NOTE — Telephone Encounter (Signed)
The Eliquis may be contributing but I think the reason that she had what sounds like some very minor bleeding is that she was constipated. She should consider taking metamucil or a stool softener.  Or consult with her medical doctor for additional advice.

## 2014-12-15 ENCOUNTER — Other Ambulatory Visit: Payer: Self-pay | Admitting: Surgery

## 2014-12-15 ENCOUNTER — Ambulatory Visit (HOSPITAL_COMMUNITY)
Admission: RE | Admit: 2014-12-15 | Discharge: 2014-12-15 | Disposition: A | Discharge status: Home / Self Care | Payer: Medicare Other | Source: Ambulatory Visit | Attending: Surgery | Admitting: Surgery

## 2014-12-15 ENCOUNTER — Ambulatory Visit
Admission: RE | Admit: 2014-12-15 | Discharge: 2014-12-15 | Disposition: A | Discharge status: Home / Self Care | Payer: Medicare Other | Source: Ambulatory Visit | Attending: Radiation Oncology | Admitting: Radiation Oncology

## 2014-12-15 DIAGNOSIS — I509 Heart failure, unspecified: Secondary | ICD-10-CM | POA: Insufficient documentation

## 2014-12-15 DIAGNOSIS — M869 Osteomyelitis, unspecified: Secondary | ICD-10-CM

## 2014-12-15 DIAGNOSIS — M8660 Other chronic osteomyelitis, unspecified site: Secondary | ICD-10-CM | POA: Diagnosis not present

## 2014-12-15 DIAGNOSIS — Z9221 Personal history of antineoplastic chemotherapy: Secondary | ICD-10-CM | POA: Diagnosis not present

## 2014-12-15 DIAGNOSIS — I517 Cardiomegaly: Secondary | ICD-10-CM | POA: Diagnosis not present

## 2014-12-15 DIAGNOSIS — Z510 Encounter for antineoplastic radiation therapy: Secondary | ICD-10-CM | POA: Diagnosis not present

## 2014-12-15 DIAGNOSIS — C50911 Malignant neoplasm of unspecified site of right female breast: Secondary | ICD-10-CM | POA: Diagnosis not present

## 2014-12-15 DIAGNOSIS — Z853 Personal history of malignant neoplasm of breast: Secondary | ICD-10-CM | POA: Insufficient documentation

## 2014-12-15 DIAGNOSIS — Z171 Estrogen receptor negative status [ER-]: Secondary | ICD-10-CM | POA: Diagnosis not present

## 2014-12-15 DIAGNOSIS — Z01818 Encounter for other preprocedural examination: Secondary | ICD-10-CM | POA: Diagnosis not present

## 2014-12-15 DIAGNOSIS — Z87891 Personal history of nicotine dependence: Secondary | ICD-10-CM | POA: Diagnosis not present

## 2014-12-15 DIAGNOSIS — C50411 Malignant neoplasm of upper-outer quadrant of right female breast: Secondary | ICD-10-CM | POA: Diagnosis not present

## 2014-12-15 NOTE — Addendum Note (Signed)
Encounter addended by: Thea Silversmith, MD on: 12/15/2014  9:22 AM<BR>     Documentation filed: Notes Section

## 2014-12-15 NOTE — Progress Notes (Signed)
Name: Cheyenne Riggs   MRN: [id number]  Date:  12/15/2014  DOB: April 06, 1946  Status:outpatient    DIAGNOSIS: Breast cancer.  CONSENT VERIFIED: yes   SET UP: Patient is setup supine   IMMOBILIZATION:  The following immobilization was used:Custom Moldable Pillow, breast board.   NARRATIVE: [name] was brought to the [facility].  Identity was confirmed.  All relevant records and images related to the planned course of therapy were reviewed.  Then, the patient was positioned in a stable reproducible clinical set-up for radiation therapy.  Wires were placed to delineate the clinical extent of breast tissue. A wire was placed on the scar as well.  CT images were obtained.  An isocenter was placed. Skin markings were placed.  The CT images were loaded into the planning software where the target and avoidance structures were contoured.  The radiation prescription was entered and confirmed. The patient was discharged in stable condition and tolerated simulation well.    TREATMENT PLANNING NOTE:  Treatment planning then occurred. I have requested : MLC's, isodose plan, basic dose calculation  I personally designed and supervised the construction of 3 medically necessary complex treatment devices for the protection of critical normal structures including the lungs and contralateral breast as well as the immobilization device which is necessary for set up certainty.   3D simulation occurred. I requested and analyzed a dose volume histogram of the heart, lungs and lumpectomy cavity.

## 2014-12-16 ENCOUNTER — Encounter (HOSPITAL_BASED_OUTPATIENT_CLINIC_OR_DEPARTMENT_OTHER): Payer: Medicare Other | Attending: Surgery

## 2014-12-16 DIAGNOSIS — R110 Nausea: Secondary | ICD-10-CM | POA: Diagnosis not present

## 2014-12-16 DIAGNOSIS — M86672 Other chronic osteomyelitis, left ankle and foot: Secondary | ICD-10-CM | POA: Diagnosis not present

## 2014-12-16 DIAGNOSIS — C50919 Malignant neoplasm of unspecified site of unspecified female breast: Secondary | ICD-10-CM | POA: Diagnosis not present

## 2014-12-16 DIAGNOSIS — Z89412 Acquired absence of left great toe: Secondary | ICD-10-CM | POA: Diagnosis not present

## 2014-12-16 DIAGNOSIS — I129 Hypertensive chronic kidney disease with stage 1 through stage 4 chronic kidney disease, or unspecified chronic kidney disease: Secondary | ICD-10-CM | POA: Diagnosis not present

## 2014-12-16 DIAGNOSIS — Z87891 Personal history of nicotine dependence: Secondary | ICD-10-CM | POA: Diagnosis not present

## 2014-12-16 DIAGNOSIS — C50011 Malignant neoplasm of nipple and areola, right female breast: Secondary | ICD-10-CM | POA: Diagnosis not present

## 2014-12-16 DIAGNOSIS — Z7901 Long term (current) use of anticoagulants: Secondary | ICD-10-CM | POA: Insufficient documentation

## 2014-12-16 DIAGNOSIS — L97524 Non-pressure chronic ulcer of other part of left foot with necrosis of bone: Secondary | ICD-10-CM | POA: Insufficient documentation

## 2014-12-16 DIAGNOSIS — R269 Unspecified abnormalities of gait and mobility: Secondary | ICD-10-CM | POA: Diagnosis not present

## 2014-12-16 DIAGNOSIS — M6281 Muscle weakness (generalized): Secondary | ICD-10-CM | POA: Diagnosis not present

## 2014-12-16 DIAGNOSIS — Z79899 Other long term (current) drug therapy: Secondary | ICD-10-CM | POA: Insufficient documentation

## 2014-12-16 DIAGNOSIS — N183 Chronic kidney disease, stage 3 (moderate): Secondary | ICD-10-CM | POA: Diagnosis not present

## 2014-12-16 DIAGNOSIS — N189 Chronic kidney disease, unspecified: Secondary | ICD-10-CM | POA: Insufficient documentation

## 2014-12-16 DIAGNOSIS — E1122 Type 2 diabetes mellitus with diabetic chronic kidney disease: Secondary | ICD-10-CM | POA: Insufficient documentation

## 2014-12-16 DIAGNOSIS — E11621 Type 2 diabetes mellitus with foot ulcer: Secondary | ICD-10-CM | POA: Diagnosis not present

## 2014-12-16 DIAGNOSIS — F419 Anxiety disorder, unspecified: Secondary | ICD-10-CM | POA: Diagnosis not present

## 2014-12-16 DIAGNOSIS — Z89419 Acquired absence of unspecified great toe: Secondary | ICD-10-CM | POA: Diagnosis not present

## 2014-12-16 DIAGNOSIS — I471 Supraventricular tachycardia: Secondary | ICD-10-CM | POA: Insufficient documentation

## 2014-12-16 DIAGNOSIS — Z9181 History of falling: Secondary | ICD-10-CM | POA: Diagnosis not present

## 2014-12-16 DIAGNOSIS — Z9221 Personal history of antineoplastic chemotherapy: Secondary | ICD-10-CM | POA: Diagnosis not present

## 2014-12-16 DIAGNOSIS — I4891 Unspecified atrial fibrillation: Secondary | ICD-10-CM | POA: Diagnosis not present

## 2014-12-16 DIAGNOSIS — E669 Obesity, unspecified: Secondary | ICD-10-CM | POA: Diagnosis not present

## 2014-12-16 DIAGNOSIS — Z4889 Encounter for other specified surgical aftercare: Secondary | ICD-10-CM | POA: Diagnosis not present

## 2014-12-16 DIAGNOSIS — L97522 Non-pressure chronic ulcer of other part of left foot with fat layer exposed: Secondary | ICD-10-CM | POA: Diagnosis not present

## 2014-12-16 DIAGNOSIS — I5043 Acute on chronic combined systolic (congestive) and diastolic (congestive) heart failure: Secondary | ICD-10-CM | POA: Diagnosis not present

## 2014-12-16 LAB — GLUCOSE, CAPILLARY
Glucose-Capillary: 114 mg/dL — ABNORMAL HIGH (ref 65–99)
Glucose-Capillary: 91 mg/dL (ref 65–99)

## 2014-12-17 DIAGNOSIS — C50911 Malignant neoplasm of unspecified site of right female breast: Secondary | ICD-10-CM | POA: Diagnosis not present

## 2014-12-17 DIAGNOSIS — E1122 Type 2 diabetes mellitus with diabetic chronic kidney disease: Secondary | ICD-10-CM | POA: Diagnosis not present

## 2014-12-17 DIAGNOSIS — M86672 Other chronic osteomyelitis, left ankle and foot: Secondary | ICD-10-CM | POA: Diagnosis not present

## 2014-12-17 DIAGNOSIS — I129 Hypertensive chronic kidney disease with stage 1 through stage 4 chronic kidney disease, or unspecified chronic kidney disease: Secondary | ICD-10-CM | POA: Diagnosis not present

## 2014-12-17 DIAGNOSIS — E11621 Type 2 diabetes mellitus with foot ulcer: Secondary | ICD-10-CM | POA: Diagnosis not present

## 2014-12-17 DIAGNOSIS — Z171 Estrogen receptor negative status [ER-]: Secondary | ICD-10-CM | POA: Diagnosis not present

## 2014-12-17 DIAGNOSIS — Z510 Encounter for antineoplastic radiation therapy: Secondary | ICD-10-CM | POA: Diagnosis not present

## 2014-12-17 DIAGNOSIS — C50411 Malignant neoplasm of upper-outer quadrant of right female breast: Secondary | ICD-10-CM | POA: Diagnosis not present

## 2014-12-17 DIAGNOSIS — Z9221 Personal history of antineoplastic chemotherapy: Secondary | ICD-10-CM | POA: Diagnosis not present

## 2014-12-17 DIAGNOSIS — L97524 Non-pressure chronic ulcer of other part of left foot with necrosis of bone: Secondary | ICD-10-CM | POA: Diagnosis not present

## 2014-12-17 DIAGNOSIS — N189 Chronic kidney disease, unspecified: Secondary | ICD-10-CM | POA: Diagnosis not present

## 2014-12-17 LAB — GLUCOSE, CAPILLARY
GLUCOSE-CAPILLARY: 157 mg/dL — AB (ref 65–99)
GLUCOSE-CAPILLARY: 135 mg/dL — AB (ref 65–99)

## 2014-12-18 DIAGNOSIS — E11621 Type 2 diabetes mellitus with foot ulcer: Secondary | ICD-10-CM | POA: Diagnosis not present

## 2014-12-18 DIAGNOSIS — N189 Chronic kidney disease, unspecified: Secondary | ICD-10-CM | POA: Diagnosis not present

## 2014-12-18 DIAGNOSIS — I129 Hypertensive chronic kidney disease with stage 1 through stage 4 chronic kidney disease, or unspecified chronic kidney disease: Secondary | ICD-10-CM | POA: Diagnosis not present

## 2014-12-18 DIAGNOSIS — E1122 Type 2 diabetes mellitus with diabetic chronic kidney disease: Secondary | ICD-10-CM | POA: Diagnosis not present

## 2014-12-18 DIAGNOSIS — M86672 Other chronic osteomyelitis, left ankle and foot: Secondary | ICD-10-CM | POA: Diagnosis not present

## 2014-12-18 DIAGNOSIS — L97524 Non-pressure chronic ulcer of other part of left foot with necrosis of bone: Secondary | ICD-10-CM | POA: Diagnosis not present

## 2014-12-18 LAB — GLUCOSE, CAPILLARY
Glucose-Capillary: 145 mg/dL — ABNORMAL HIGH (ref 65–99)
Glucose-Capillary: 117 mg/dL — ABNORMAL HIGH (ref 65–99)

## 2014-12-19 DIAGNOSIS — R269 Unspecified abnormalities of gait and mobility: Secondary | ICD-10-CM | POA: Diagnosis not present

## 2014-12-19 DIAGNOSIS — Z4889 Encounter for other specified surgical aftercare: Secondary | ICD-10-CM | POA: Diagnosis not present

## 2014-12-19 DIAGNOSIS — Z89419 Acquired absence of unspecified great toe: Secondary | ICD-10-CM | POA: Diagnosis not present

## 2014-12-19 DIAGNOSIS — C50011 Malignant neoplasm of nipple and areola, right female breast: Secondary | ICD-10-CM | POA: Diagnosis not present

## 2014-12-19 DIAGNOSIS — I5043 Acute on chronic combined systolic (congestive) and diastolic (congestive) heart failure: Secondary | ICD-10-CM | POA: Diagnosis not present

## 2014-12-19 DIAGNOSIS — I129 Hypertensive chronic kidney disease with stage 1 through stage 4 chronic kidney disease, or unspecified chronic kidney disease: Secondary | ICD-10-CM | POA: Diagnosis not present

## 2014-12-21 DIAGNOSIS — H6983 Other specified disorders of Eustachian tube, bilateral: Secondary | ICD-10-CM | POA: Diagnosis not present

## 2014-12-22 ENCOUNTER — Encounter: Payer: Self-pay | Admitting: Vascular Surgery

## 2014-12-22 ENCOUNTER — Ambulatory Visit
Admission: RE | Admit: 2014-12-22 | Discharge: 2014-12-22 | Disposition: A | Discharge status: Home / Self Care | Payer: Medicare Other | Source: Ambulatory Visit | Attending: Radiation Oncology | Admitting: Radiation Oncology

## 2014-12-22 DIAGNOSIS — E11621 Type 2 diabetes mellitus with foot ulcer: Secondary | ICD-10-CM | POA: Diagnosis not present

## 2014-12-22 DIAGNOSIS — E1122 Type 2 diabetes mellitus with diabetic chronic kidney disease: Secondary | ICD-10-CM | POA: Diagnosis not present

## 2014-12-22 DIAGNOSIS — Z9221 Personal history of antineoplastic chemotherapy: Secondary | ICD-10-CM | POA: Diagnosis not present

## 2014-12-22 DIAGNOSIS — I129 Hypertensive chronic kidney disease with stage 1 through stage 4 chronic kidney disease, or unspecified chronic kidney disease: Secondary | ICD-10-CM | POA: Diagnosis not present

## 2014-12-22 DIAGNOSIS — Z510 Encounter for antineoplastic radiation therapy: Secondary | ICD-10-CM | POA: Diagnosis not present

## 2014-12-22 DIAGNOSIS — C50411 Malignant neoplasm of upper-outer quadrant of right female breast: Secondary | ICD-10-CM | POA: Diagnosis not present

## 2014-12-22 DIAGNOSIS — C50911 Malignant neoplasm of unspecified site of right female breast: Secondary | ICD-10-CM | POA: Diagnosis not present

## 2014-12-22 DIAGNOSIS — L97524 Non-pressure chronic ulcer of other part of left foot with necrosis of bone: Secondary | ICD-10-CM | POA: Diagnosis not present

## 2014-12-22 DIAGNOSIS — Z171 Estrogen receptor negative status [ER-]: Secondary | ICD-10-CM | POA: Diagnosis not present

## 2014-12-22 DIAGNOSIS — M86672 Other chronic osteomyelitis, left ankle and foot: Secondary | ICD-10-CM | POA: Diagnosis not present

## 2014-12-22 DIAGNOSIS — N189 Chronic kidney disease, unspecified: Secondary | ICD-10-CM | POA: Diagnosis not present

## 2014-12-22 LAB — GLUCOSE, CAPILLARY
GLUCOSE-CAPILLARY: 150 mg/dL — AB (ref 65–99)
Glucose-Capillary: 145 mg/dL — ABNORMAL HIGH (ref 65–99)

## 2014-12-23 ENCOUNTER — Ambulatory Visit
Admission: RE | Admit: 2014-12-23 | Discharge: 2014-12-23 | Disposition: A | Discharge status: Home / Self Care | Payer: Medicare Other | Source: Ambulatory Visit | Attending: Radiation Oncology | Admitting: Radiation Oncology

## 2014-12-23 ENCOUNTER — Encounter: Payer: Medicare Other | Admitting: Vascular Surgery

## 2014-12-23 ENCOUNTER — Other Ambulatory Visit: Payer: Self-pay | Admitting: Vascular Surgery

## 2014-12-23 ENCOUNTER — Encounter: Payer: Self-pay | Admitting: Vascular Surgery

## 2014-12-23 ENCOUNTER — Other Ambulatory Visit: Payer: Self-pay | Admitting: Hematology and Oncology

## 2014-12-23 ENCOUNTER — Ambulatory Visit (INDEPENDENT_AMBULATORY_CARE_PROVIDER_SITE_OTHER): Payer: Self-pay | Admitting: Vascular Surgery

## 2014-12-23 VITALS — BP 182/74 | HR 74 | Temp 97.9°F | Resp 16 | Ht 63.0 in | Wt 156.0 lb

## 2014-12-23 DIAGNOSIS — I129 Hypertensive chronic kidney disease with stage 1 through stage 4 chronic kidney disease, or unspecified chronic kidney disease: Secondary | ICD-10-CM | POA: Diagnosis not present

## 2014-12-23 DIAGNOSIS — E1122 Type 2 diabetes mellitus with diabetic chronic kidney disease: Secondary | ICD-10-CM | POA: Diagnosis not present

## 2014-12-23 DIAGNOSIS — M86672 Other chronic osteomyelitis, left ankle and foot: Secondary | ICD-10-CM | POA: Diagnosis not present

## 2014-12-23 DIAGNOSIS — C50411 Malignant neoplasm of upper-outer quadrant of right female breast: Secondary | ICD-10-CM | POA: Diagnosis not present

## 2014-12-23 DIAGNOSIS — N189 Chronic kidney disease, unspecified: Secondary | ICD-10-CM | POA: Diagnosis not present

## 2014-12-23 DIAGNOSIS — C50911 Malignant neoplasm of unspecified site of right female breast: Secondary | ICD-10-CM | POA: Diagnosis not present

## 2014-12-23 DIAGNOSIS — M79675 Pain in left toe(s): Secondary | ICD-10-CM | POA: Insufficient documentation

## 2014-12-23 DIAGNOSIS — L97522 Non-pressure chronic ulcer of other part of left foot with fat layer exposed: Secondary | ICD-10-CM | POA: Diagnosis not present

## 2014-12-23 DIAGNOSIS — Z9221 Personal history of antineoplastic chemotherapy: Secondary | ICD-10-CM | POA: Diagnosis not present

## 2014-12-23 DIAGNOSIS — I70299 Other atherosclerosis of native arteries of extremities, unspecified extremity: Secondary | ICD-10-CM

## 2014-12-23 DIAGNOSIS — Z171 Estrogen receptor negative status [ER-]: Secondary | ICD-10-CM | POA: Diagnosis not present

## 2014-12-23 DIAGNOSIS — L97909 Non-pressure chronic ulcer of unspecified part of unspecified lower leg with unspecified severity: Secondary | ICD-10-CM

## 2014-12-23 DIAGNOSIS — E11621 Type 2 diabetes mellitus with foot ulcer: Secondary | ICD-10-CM | POA: Diagnosis not present

## 2014-12-23 DIAGNOSIS — Z510 Encounter for antineoplastic radiation therapy: Secondary | ICD-10-CM | POA: Diagnosis not present

## 2014-12-23 DIAGNOSIS — L97524 Non-pressure chronic ulcer of other part of left foot with necrosis of bone: Secondary | ICD-10-CM | POA: Diagnosis not present

## 2014-12-23 LAB — GLUCOSE, CAPILLARY
GLUCOSE-CAPILLARY: 97 mg/dL (ref 65–99)
Glucose-Capillary: 114 mg/dL — ABNORMAL HIGH (ref 65–99)

## 2014-12-23 MED ORDER — LORAZEPAM 0.5 MG PO TABS: 0.5000 mg | ORAL_TABLET | Freq: Three times a day (TID) | ORAL | Status: DC | PRN

## 2014-12-23 NOTE — Progress Notes (Signed)
Filed Vitals:   12/23/14 1342 12/23/14 1348  BP: 172/67 182/74  Pulse: 72 74  Temp: 97.9 F (36.6 C)   TempSrc: Oral   Resp: 16   Height: 5\' 3"  (1.6 m)   Weight: 156 lb (70.761 kg)   SpO2: 100%

## 2014-12-23 NOTE — Progress Notes (Signed)
Patient ID: DREAMER CARILLO, female   DOB: [birth year], 69 y.o.   MRN: [id number]   Patient name: Cheyenne Riggs MRN: [id number] DOB: November 04, 1945 Sex: female  REASON FOR VISIT: follow up after amputation of left great toe  HPI: Cheyenne Riggs is a 69 y.o. female who presented with a nonhealing wound of the left great toe. She had osteomyelitis. An arteriogram was performed which showed single vessel runoff via the anterior tibial artery on the left with reasonable blood flow and no real options for revascularization. She underwent amputation of the left great toe on 12/01/2014. She comes in for a routine follow up visit. She has no specific complaints. She denies fever or chills. She denies any drainage from the wound.  Current Outpatient Prescriptions  Medication Sig Dispense Refill  . apixaban (ELIQUIS) 5 MG TABS tablet Take 1 tablet (5 mg total) by mouth 2 (two) times daily. 60 tablet 11  . diphenhydrAMINE (BENADRYL) 25 MG tablet Take 25 mg by mouth daily as needed for allergies (allergies).     Marland Kitchen FLUoxetine (PROZAC) 10 MG capsule Take 1 capsule (10 mg total) by mouth daily. 30 capsule 1  . furosemide (LASIX) 40 MG tablet Take 1 tablet (40 mg total) by mouth daily. 30 tablet 0  . KLOR-CON M10 10 MEQ tablet     . LORazepam (ATIVAN) 0.5 MG tablet Take 1 tablet (0.5 mg total) by mouth every 8 (eight) hours as needed for anxiety. 30 tablet 0  . metoprolol tartrate (LOPRESSOR) 25 MG tablet Take 1 tablet (25 mg total) by mouth 2 (two) times daily. 60 tablet 3  . oxyCODONE-acetaminophen (PERCOCET/ROXICET) 5-325 MG per tablet Take 1 tablet by mouth 3 (three) times daily as needed. 20 tablet 0  . potassium chloride (MICRO-K) 10 MEQ CR capsule Take 1 capsule (10 mEq total) by mouth 2 (two) times daily. 180 capsule 3  . Probiotic Product (PROBIOTIC DAILY PO) Take 1 tablet by mouth daily.     . promethazine (PHENERGAN) 12.5 MG suppository Place 12.5 mg rectally every 6 (six) hours as needed for nausea or vomiting.      No current facility-administered medications for this visit.   REVIEW OF SYSTEMS: Valu.Nieves ] denotes positive finding; [  ] denotes negative finding  CARDIOVASCULAR:  [ ]  chest pain   [ ]  dyspnea on exertion    CONSTITUTIONAL:  [ ]  fever   [ ]  chills  PHYSICAL EXAM: Filed Vitals:   12/23/14 1342 12/23/14 1348  BP: 172/67 182/74  Pulse: 72 74  Temp: 97.9 F (36.6 C)   TempSrc: Oral   Resp: 16   Height: 5\' 3"  (1.6 m)   Weight: 156 lb (70.761 kg)   SpO2: 100%    GENERAL: The patient is a well-nourished female, in no acute distress. The vital signs are documented above. CARDIOVASCULAR: There is a regular rate and rhythm. PULMONARY: There is good air exchange bilaterally without wheezing or rales. Her left great toe amputation site is healing nicely and I removed her sutures in the office today.  MEDICAL ISSUES:  STATUS POST LEFT GREAT TOE AMPUTATION: The left great toe amputation site appears to be healing nicely. At this point I think she can wear a regular shoe. I've instructed her to be careful with her foot for the next several weeks but I think this will heal up without any difficulty. I will see her back as needed.  Deitra Mayo [facility]: [phone]

## 2014-12-24 ENCOUNTER — Ambulatory Visit
Admission: RE | Admit: 2014-12-24 | Discharge: 2014-12-24 | Disposition: A | Discharge status: Home / Self Care | Payer: Medicare Other | Source: Ambulatory Visit | Attending: Radiation Oncology | Admitting: Radiation Oncology

## 2014-12-24 DIAGNOSIS — Z171 Estrogen receptor negative status [ER-]: Secondary | ICD-10-CM | POA: Diagnosis not present

## 2014-12-24 DIAGNOSIS — Z510 Encounter for antineoplastic radiation therapy: Secondary | ICD-10-CM | POA: Diagnosis not present

## 2014-12-24 DIAGNOSIS — C50911 Malignant neoplasm of unspecified site of right female breast: Secondary | ICD-10-CM | POA: Diagnosis not present

## 2014-12-24 DIAGNOSIS — Z9221 Personal history of antineoplastic chemotherapy: Secondary | ICD-10-CM | POA: Diagnosis not present

## 2014-12-24 DIAGNOSIS — C50411 Malignant neoplasm of upper-outer quadrant of right female breast: Secondary | ICD-10-CM | POA: Diagnosis not present

## 2014-12-25 ENCOUNTER — Other Ambulatory Visit: Payer: Self-pay | Admitting: Hematology and Oncology

## 2014-12-25 ENCOUNTER — Encounter: Payer: Self-pay | Admitting: Hematology and Oncology

## 2014-12-25 ENCOUNTER — Ambulatory Visit
Admission: RE | Admit: 2014-12-25 | Discharge: 2014-12-25 | Disposition: A | Discharge status: Home / Self Care | Payer: Medicare Other | Source: Ambulatory Visit | Attending: Radiation Oncology | Admitting: Radiation Oncology

## 2014-12-25 DIAGNOSIS — Z89419 Acquired absence of unspecified great toe: Secondary | ICD-10-CM | POA: Diagnosis not present

## 2014-12-25 DIAGNOSIS — Z4889 Encounter for other specified surgical aftercare: Secondary | ICD-10-CM | POA: Diagnosis not present

## 2014-12-25 DIAGNOSIS — Z510 Encounter for antineoplastic radiation therapy: Secondary | ICD-10-CM | POA: Diagnosis not present

## 2014-12-25 DIAGNOSIS — I129 Hypertensive chronic kidney disease with stage 1 through stage 4 chronic kidney disease, or unspecified chronic kidney disease: Secondary | ICD-10-CM | POA: Diagnosis not present

## 2014-12-25 DIAGNOSIS — Z9221 Personal history of antineoplastic chemotherapy: Secondary | ICD-10-CM | POA: Diagnosis not present

## 2014-12-25 DIAGNOSIS — C50911 Malignant neoplasm of unspecified site of right female breast: Secondary | ICD-10-CM | POA: Diagnosis not present

## 2014-12-25 DIAGNOSIS — C50011 Malignant neoplasm of nipple and areola, right female breast: Secondary | ICD-10-CM | POA: Diagnosis not present

## 2014-12-25 DIAGNOSIS — Z171 Estrogen receptor negative status [ER-]: Secondary | ICD-10-CM | POA: Diagnosis not present

## 2014-12-25 DIAGNOSIS — C50411 Malignant neoplasm of upper-outer quadrant of right female breast: Secondary | ICD-10-CM | POA: Diagnosis not present

## 2014-12-25 DIAGNOSIS — I5043 Acute on chronic combined systolic (congestive) and diastolic (congestive) heart failure: Secondary | ICD-10-CM | POA: Diagnosis not present

## 2014-12-25 DIAGNOSIS — R269 Unspecified abnormalities of gait and mobility: Secondary | ICD-10-CM | POA: Diagnosis not present

## 2014-12-28 ENCOUNTER — Other Ambulatory Visit: Payer: Self-pay | Admitting: Vascular Surgery

## 2014-12-28 ENCOUNTER — Ambulatory Visit
Admission: RE | Admit: 2014-12-28 | Discharge: 2014-12-28 | Disposition: A | Discharge status: Home / Self Care | Payer: Medicare Other | Source: Ambulatory Visit | Attending: Radiation Oncology | Admitting: Radiation Oncology

## 2014-12-28 DIAGNOSIS — C50411 Malignant neoplasm of upper-outer quadrant of right female breast: Secondary | ICD-10-CM | POA: Diagnosis not present

## 2014-12-28 DIAGNOSIS — Z510 Encounter for antineoplastic radiation therapy: Secondary | ICD-10-CM | POA: Diagnosis not present

## 2014-12-28 DIAGNOSIS — Z9221 Personal history of antineoplastic chemotherapy: Secondary | ICD-10-CM | POA: Diagnosis not present

## 2014-12-28 DIAGNOSIS — Z171 Estrogen receptor negative status [ER-]: Secondary | ICD-10-CM | POA: Diagnosis not present

## 2014-12-28 DIAGNOSIS — C50911 Malignant neoplasm of unspecified site of right female breast: Secondary | ICD-10-CM | POA: Diagnosis not present

## 2014-12-28 NOTE — Telephone Encounter (Signed)
LMTCB re; pain medication refill. She was seen last week by Dr. Scot Dock and he said her toe amputation site was healing nicely. Will get more information when she calls back.

## 2014-12-29 ENCOUNTER — Ambulatory Visit
Admission: RE | Admit: 2014-12-29 | Discharge: 2014-12-29 | Disposition: A | Discharge status: Home / Self Care | Payer: Medicare Other | Source: Ambulatory Visit | Attending: Radiation Oncology | Admitting: Radiation Oncology

## 2014-12-29 VITALS — BP 168/79 | HR 60 | Temp 97.9°F | Wt 152.9 lb

## 2014-12-29 DIAGNOSIS — C50411 Malignant neoplasm of upper-outer quadrant of right female breast: Secondary | ICD-10-CM | POA: Diagnosis not present

## 2014-12-29 DIAGNOSIS — C50911 Malignant neoplasm of unspecified site of right female breast: Secondary | ICD-10-CM | POA: Diagnosis not present

## 2014-12-29 DIAGNOSIS — Z510 Encounter for antineoplastic radiation therapy: Secondary | ICD-10-CM | POA: Diagnosis not present

## 2014-12-29 DIAGNOSIS — Z9221 Personal history of antineoplastic chemotherapy: Secondary | ICD-10-CM | POA: Diagnosis not present

## 2014-12-29 DIAGNOSIS — Z171 Estrogen receptor negative status [ER-]: Secondary | ICD-10-CM | POA: Diagnosis not present

## 2014-12-29 MED ORDER — RADIAPLEXRX EX GEL
Freq: Once | CUTANEOUS | Status: AC
  Administered 2014-12-29: 10:00:00 via TOPICAL

## 2014-12-29 MED ORDER — ALRA NON-METALLIC DEODORANT (RAD-ONC)
1.0000 | Freq: Once | TOPICAL | Status: AC
Start: 2014-12-29 — End: 2014-12-29
  Administered 2014-12-29: 1 via TOPICAL

## 2014-12-29 NOTE — Addendum Note (Signed)
Encounter addended by: Norm Salt, RN on: 12/29/2014  4:17 PM<BR>     Documentation filed: Notes Section

## 2014-12-29 NOTE — Progress Notes (Signed)
Weekly Management Note Current Dose: 9  Gy  Projected Dose: 61 Gy   Narrative:  The patient presents for routine under treatment assessment.  CBCT/MVCT images/Port film x-rays were reviewed.  The chart was checked. Doing well. No complaints.   Physical Findings: Weight: 152 lb 14.4 oz (69.355 kg). Unchanged. No complaints.   Impression:  The patient is tolerating radiation.  Plan:  Continue treatment as planned.

## 2014-12-30 ENCOUNTER — Telehealth: Payer: Self-pay

## 2014-12-30 ENCOUNTER — Ambulatory Visit
Admission: RE | Admit: 2014-12-30 | Discharge: 2014-12-30 | Disposition: A | Discharge status: Home / Self Care | Payer: Medicare Other | Source: Ambulatory Visit | Attending: Radiation Oncology | Admitting: Radiation Oncology

## 2014-12-30 DIAGNOSIS — Z9221 Personal history of antineoplastic chemotherapy: Secondary | ICD-10-CM | POA: Diagnosis not present

## 2014-12-30 DIAGNOSIS — I5043 Acute on chronic combined systolic (congestive) and diastolic (congestive) heart failure: Secondary | ICD-10-CM | POA: Diagnosis not present

## 2014-12-30 DIAGNOSIS — Z171 Estrogen receptor negative status [ER-]: Secondary | ICD-10-CM | POA: Diagnosis not present

## 2014-12-30 DIAGNOSIS — C50411 Malignant neoplasm of upper-outer quadrant of right female breast: Secondary | ICD-10-CM | POA: Diagnosis not present

## 2014-12-30 DIAGNOSIS — N189 Chronic kidney disease, unspecified: Secondary | ICD-10-CM | POA: Diagnosis not present

## 2014-12-30 DIAGNOSIS — C50911 Malignant neoplasm of unspecified site of right female breast: Secondary | ICD-10-CM | POA: Diagnosis not present

## 2014-12-30 DIAGNOSIS — Z4889 Encounter for other specified surgical aftercare: Secondary | ICD-10-CM | POA: Diagnosis not present

## 2014-12-30 DIAGNOSIS — Z89419 Acquired absence of unspecified great toe: Secondary | ICD-10-CM | POA: Diagnosis not present

## 2014-12-30 DIAGNOSIS — L97522 Non-pressure chronic ulcer of other part of left foot with fat layer exposed: Secondary | ICD-10-CM | POA: Diagnosis not present

## 2014-12-30 DIAGNOSIS — Z510 Encounter for antineoplastic radiation therapy: Secondary | ICD-10-CM | POA: Diagnosis not present

## 2014-12-30 DIAGNOSIS — R269 Unspecified abnormalities of gait and mobility: Secondary | ICD-10-CM | POA: Diagnosis not present

## 2014-12-30 DIAGNOSIS — C50011 Malignant neoplasm of nipple and areola, right female breast: Secondary | ICD-10-CM | POA: Diagnosis not present

## 2014-12-30 DIAGNOSIS — I129 Hypertensive chronic kidney disease with stage 1 through stage 4 chronic kidney disease, or unspecified chronic kidney disease: Secondary | ICD-10-CM | POA: Diagnosis not present

## 2014-12-30 DIAGNOSIS — E11621 Type 2 diabetes mellitus with foot ulcer: Secondary | ICD-10-CM | POA: Diagnosis not present

## 2014-12-30 DIAGNOSIS — E1122 Type 2 diabetes mellitus with diabetic chronic kidney disease: Secondary | ICD-10-CM | POA: Diagnosis not present

## 2014-12-30 DIAGNOSIS — M86672 Other chronic osteomyelitis, left ankle and foot: Secondary | ICD-10-CM | POA: Diagnosis not present

## 2014-12-30 DIAGNOSIS — L97524 Non-pressure chronic ulcer of other part of left foot with necrosis of bone: Secondary | ICD-10-CM | POA: Diagnosis not present

## 2014-12-30 NOTE — Telephone Encounter (Signed)
VM from pt stating her neuropathy in her feet are worse and is requesting something for pain at night. VM forwarded to Dr. Geralyn Flash nurse.

## 2014-12-31 ENCOUNTER — Ambulatory Visit
Admission: RE | Admit: 2014-12-31 | Discharge: 2014-12-31 | Disposition: A | Discharge status: Home / Self Care | Payer: Medicare Other | Source: Ambulatory Visit | Attending: Radiation Oncology | Admitting: Radiation Oncology

## 2014-12-31 DIAGNOSIS — Z171 Estrogen receptor negative status [ER-]: Secondary | ICD-10-CM | POA: Diagnosis not present

## 2014-12-31 DIAGNOSIS — N189 Chronic kidney disease, unspecified: Secondary | ICD-10-CM | POA: Diagnosis not present

## 2014-12-31 DIAGNOSIS — C50911 Malignant neoplasm of unspecified site of right female breast: Secondary | ICD-10-CM | POA: Diagnosis not present

## 2014-12-31 DIAGNOSIS — E11621 Type 2 diabetes mellitus with foot ulcer: Secondary | ICD-10-CM | POA: Diagnosis not present

## 2014-12-31 DIAGNOSIS — Z510 Encounter for antineoplastic radiation therapy: Secondary | ICD-10-CM | POA: Diagnosis not present

## 2014-12-31 DIAGNOSIS — I129 Hypertensive chronic kidney disease with stage 1 through stage 4 chronic kidney disease, or unspecified chronic kidney disease: Secondary | ICD-10-CM | POA: Diagnosis not present

## 2014-12-31 DIAGNOSIS — L97524 Non-pressure chronic ulcer of other part of left foot with necrosis of bone: Secondary | ICD-10-CM | POA: Diagnosis not present

## 2014-12-31 DIAGNOSIS — E1122 Type 2 diabetes mellitus with diabetic chronic kidney disease: Secondary | ICD-10-CM | POA: Diagnosis not present

## 2014-12-31 DIAGNOSIS — C50411 Malignant neoplasm of upper-outer quadrant of right female breast: Secondary | ICD-10-CM | POA: Diagnosis not present

## 2014-12-31 DIAGNOSIS — M86672 Other chronic osteomyelitis, left ankle and foot: Secondary | ICD-10-CM | POA: Diagnosis not present

## 2014-12-31 DIAGNOSIS — Z9221 Personal history of antineoplastic chemotherapy: Secondary | ICD-10-CM | POA: Diagnosis not present

## 2014-12-31 LAB — GLUCOSE, CAPILLARY
GLUCOSE-CAPILLARY: 112 mg/dL — AB (ref 65–99)
Glucose-Capillary: 93 mg/dL (ref 65–99)

## 2015-01-01 ENCOUNTER — Ambulatory Visit
Admission: RE | Admit: 2015-01-01 | Discharge: 2015-01-01 | Disposition: A | Discharge status: Home / Self Care | Payer: Medicare Other | Source: Ambulatory Visit | Attending: Radiation Oncology | Admitting: Radiation Oncology

## 2015-01-01 DIAGNOSIS — Z89419 Acquired absence of unspecified great toe: Secondary | ICD-10-CM | POA: Diagnosis not present

## 2015-01-01 DIAGNOSIS — M86672 Other chronic osteomyelitis, left ankle and foot: Secondary | ICD-10-CM | POA: Diagnosis not present

## 2015-01-01 DIAGNOSIS — N189 Chronic kidney disease, unspecified: Secondary | ICD-10-CM | POA: Diagnosis not present

## 2015-01-01 DIAGNOSIS — C50911 Malignant neoplasm of unspecified site of right female breast: Secondary | ICD-10-CM | POA: Diagnosis not present

## 2015-01-01 DIAGNOSIS — R269 Unspecified abnormalities of gait and mobility: Secondary | ICD-10-CM | POA: Diagnosis not present

## 2015-01-01 DIAGNOSIS — E11621 Type 2 diabetes mellitus with foot ulcer: Secondary | ICD-10-CM | POA: Diagnosis not present

## 2015-01-01 DIAGNOSIS — Z171 Estrogen receptor negative status [ER-]: Secondary | ICD-10-CM | POA: Diagnosis not present

## 2015-01-01 DIAGNOSIS — Z4889 Encounter for other specified surgical aftercare: Secondary | ICD-10-CM | POA: Diagnosis not present

## 2015-01-01 DIAGNOSIS — Z510 Encounter for antineoplastic radiation therapy: Secondary | ICD-10-CM | POA: Diagnosis not present

## 2015-01-01 DIAGNOSIS — E1122 Type 2 diabetes mellitus with diabetic chronic kidney disease: Secondary | ICD-10-CM | POA: Diagnosis not present

## 2015-01-01 DIAGNOSIS — C50411 Malignant neoplasm of upper-outer quadrant of right female breast: Secondary | ICD-10-CM | POA: Diagnosis not present

## 2015-01-01 DIAGNOSIS — I129 Hypertensive chronic kidney disease with stage 1 through stage 4 chronic kidney disease, or unspecified chronic kidney disease: Secondary | ICD-10-CM | POA: Diagnosis not present

## 2015-01-01 DIAGNOSIS — L97524 Non-pressure chronic ulcer of other part of left foot with necrosis of bone: Secondary | ICD-10-CM | POA: Diagnosis not present

## 2015-01-01 DIAGNOSIS — C50011 Malignant neoplasm of nipple and areola, right female breast: Secondary | ICD-10-CM | POA: Diagnosis not present

## 2015-01-01 DIAGNOSIS — Z9221 Personal history of antineoplastic chemotherapy: Secondary | ICD-10-CM | POA: Diagnosis not present

## 2015-01-01 DIAGNOSIS — I5043 Acute on chronic combined systolic (congestive) and diastolic (congestive) heart failure: Secondary | ICD-10-CM | POA: Diagnosis not present

## 2015-01-01 LAB — GLUCOSE, CAPILLARY
GLUCOSE-CAPILLARY: 113 mg/dL — AB (ref 65–99)
Glucose-Capillary: 93 mg/dL (ref 65–99)
Glucose-Capillary: 129 mg/dL — ABNORMAL HIGH (ref 65–99)

## 2015-01-04 ENCOUNTER — Ambulatory Visit
Admission: RE | Admit: 2015-01-04 | Discharge: 2015-01-04 | Disposition: A | Discharge status: Home / Self Care | Payer: Medicare Other | Source: Ambulatory Visit | Attending: Radiation Oncology | Admitting: Radiation Oncology

## 2015-01-04 ENCOUNTER — Encounter: Payer: Self-pay | Admitting: Hematology and Oncology

## 2015-01-04 DIAGNOSIS — Z171 Estrogen receptor negative status [ER-]: Secondary | ICD-10-CM | POA: Diagnosis not present

## 2015-01-04 DIAGNOSIS — M86672 Other chronic osteomyelitis, left ankle and foot: Secondary | ICD-10-CM | POA: Diagnosis not present

## 2015-01-04 DIAGNOSIS — E11621 Type 2 diabetes mellitus with foot ulcer: Secondary | ICD-10-CM | POA: Diagnosis not present

## 2015-01-04 DIAGNOSIS — G5793 Unspecified mononeuropathy of bilateral lower limbs: Secondary | ICD-10-CM

## 2015-01-04 DIAGNOSIS — C50911 Malignant neoplasm of unspecified site of right female breast: Secondary | ICD-10-CM | POA: Diagnosis not present

## 2015-01-04 DIAGNOSIS — Z9221 Personal history of antineoplastic chemotherapy: Secondary | ICD-10-CM | POA: Diagnosis not present

## 2015-01-04 DIAGNOSIS — C50411 Malignant neoplasm of upper-outer quadrant of right female breast: Secondary | ICD-10-CM

## 2015-01-04 DIAGNOSIS — Z510 Encounter for antineoplastic radiation therapy: Secondary | ICD-10-CM | POA: Diagnosis not present

## 2015-01-04 DIAGNOSIS — N189 Chronic kidney disease, unspecified: Secondary | ICD-10-CM | POA: Diagnosis not present

## 2015-01-04 DIAGNOSIS — E1122 Type 2 diabetes mellitus with diabetic chronic kidney disease: Secondary | ICD-10-CM | POA: Diagnosis not present

## 2015-01-04 DIAGNOSIS — I129 Hypertensive chronic kidney disease with stage 1 through stage 4 chronic kidney disease, or unspecified chronic kidney disease: Secondary | ICD-10-CM | POA: Diagnosis not present

## 2015-01-04 DIAGNOSIS — L97524 Non-pressure chronic ulcer of other part of left foot with necrosis of bone: Secondary | ICD-10-CM | POA: Diagnosis not present

## 2015-01-04 LAB — GLUCOSE, CAPILLARY
GLUCOSE-CAPILLARY: 122 mg/dL — AB (ref 65–99)
Glucose-Capillary: 108 mg/dL — ABNORMAL HIGH (ref 65–99)

## 2015-01-04 MED ORDER — OXYCODONE-ACETAMINOPHEN 5-325 MG PO TABS: 1.0000 | ORAL_TABLET | Freq: Every evening | ORAL | Status: DC | PRN

## 2015-01-04 NOTE — Telephone Encounter (Signed)
Let pt know she can pick up prescription 8/23 after her treatments.  Pt voiced understanding.

## 2015-01-04 NOTE — Telephone Encounter (Signed)
Per Dr. Lindi Adie, she should take percocet 1 tablet at bedtime.  LMOVM for pt - if she has remaining percocet from her prescription or needs refill.  Pt to call clinic and let us know.

## 2015-01-05 ENCOUNTER — Ambulatory Visit
Admission: RE | Admit: 2015-01-05 | Discharge: 2015-01-05 | Disposition: A | Discharge status: Home / Self Care | Payer: Medicare Other | Source: Ambulatory Visit | Attending: Radiation Oncology | Admitting: Radiation Oncology

## 2015-01-05 VITALS — BP 139/62 | HR 64 | Temp 97.8°F | Wt 156.9 lb

## 2015-01-05 DIAGNOSIS — C50411 Malignant neoplasm of upper-outer quadrant of right female breast: Secondary | ICD-10-CM

## 2015-01-05 DIAGNOSIS — Z9221 Personal history of antineoplastic chemotherapy: Secondary | ICD-10-CM | POA: Diagnosis not present

## 2015-01-05 DIAGNOSIS — L97524 Non-pressure chronic ulcer of other part of left foot with necrosis of bone: Secondary | ICD-10-CM | POA: Diagnosis not present

## 2015-01-05 DIAGNOSIS — Z510 Encounter for antineoplastic radiation therapy: Secondary | ICD-10-CM | POA: Diagnosis not present

## 2015-01-05 DIAGNOSIS — N189 Chronic kidney disease, unspecified: Secondary | ICD-10-CM | POA: Diagnosis not present

## 2015-01-05 DIAGNOSIS — E1122 Type 2 diabetes mellitus with diabetic chronic kidney disease: Secondary | ICD-10-CM | POA: Diagnosis not present

## 2015-01-05 DIAGNOSIS — Z171 Estrogen receptor negative status [ER-]: Secondary | ICD-10-CM | POA: Diagnosis not present

## 2015-01-05 DIAGNOSIS — M86672 Other chronic osteomyelitis, left ankle and foot: Secondary | ICD-10-CM | POA: Diagnosis not present

## 2015-01-05 DIAGNOSIS — E11621 Type 2 diabetes mellitus with foot ulcer: Secondary | ICD-10-CM | POA: Diagnosis not present

## 2015-01-05 DIAGNOSIS — I129 Hypertensive chronic kidney disease with stage 1 through stage 4 chronic kidney disease, or unspecified chronic kidney disease: Secondary | ICD-10-CM | POA: Diagnosis not present

## 2015-01-05 DIAGNOSIS — C50911 Malignant neoplasm of unspecified site of right female breast: Secondary | ICD-10-CM | POA: Diagnosis not present

## 2015-01-05 LAB — GLUCOSE, CAPILLARY
GLUCOSE-CAPILLARY: 91 mg/dL (ref 65–99)
GLUCOSE-CAPILLARY: 113 mg/dL — AB (ref 65–99)

## 2015-01-05 NOTE — Progress Notes (Signed)
Weekly assessment of radiation to right breast.Completed 10 of 25 treatments.No skin changes or problems from radiation.Pain of left great toe greater at bedtime.She will pick up script from [facility] today.

## 2015-01-05 NOTE — Progress Notes (Signed)
Weekly Management Note Current Dose: 18  Gy  Projected Dose: 61 Gy   Narrative:  The patient presents for routine under treatment assessment.  CBCT/MVCT images/Port film x-rays were reviewed.  The chart was checked. Doing well. No complaints. Received prescription for feet from Dr. Lindi Adie.  Wound is healing well.   Physical Findings: Weight: 156 lb 14.4 oz (71.169 kg). Unchanged skin.   Impression:  The patient is tolerating radiation.  Plan:  Continue treatment as planned. Continue radiaplex.

## 2015-01-06 ENCOUNTER — Ambulatory Visit
Admission: RE | Admit: 2015-01-06 | Discharge: 2015-01-06 | Disposition: A | Discharge status: Home / Self Care | Payer: Medicare Other | Source: Ambulatory Visit | Attending: Radiation Oncology | Admitting: Radiation Oncology

## 2015-01-06 DIAGNOSIS — C50411 Malignant neoplasm of upper-outer quadrant of right female breast: Secondary | ICD-10-CM | POA: Diagnosis not present

## 2015-01-06 DIAGNOSIS — N189 Chronic kidney disease, unspecified: Secondary | ICD-10-CM | POA: Diagnosis not present

## 2015-01-06 DIAGNOSIS — M86672 Other chronic osteomyelitis, left ankle and foot: Secondary | ICD-10-CM | POA: Diagnosis not present

## 2015-01-06 DIAGNOSIS — Z171 Estrogen receptor negative status [ER-]: Secondary | ICD-10-CM | POA: Diagnosis not present

## 2015-01-06 DIAGNOSIS — E1122 Type 2 diabetes mellitus with diabetic chronic kidney disease: Secondary | ICD-10-CM | POA: Diagnosis not present

## 2015-01-06 DIAGNOSIS — C50911 Malignant neoplasm of unspecified site of right female breast: Secondary | ICD-10-CM | POA: Diagnosis not present

## 2015-01-06 DIAGNOSIS — E11621 Type 2 diabetes mellitus with foot ulcer: Secondary | ICD-10-CM | POA: Diagnosis not present

## 2015-01-06 DIAGNOSIS — L97524 Non-pressure chronic ulcer of other part of left foot with necrosis of bone: Secondary | ICD-10-CM | POA: Diagnosis not present

## 2015-01-06 DIAGNOSIS — L89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, stage 2: Secondary | ICD-10-CM | POA: Diagnosis not present

## 2015-01-06 DIAGNOSIS — L97522 Non-pressure chronic ulcer of other part of left foot with fat layer exposed: Secondary | ICD-10-CM | POA: Diagnosis not present

## 2015-01-06 DIAGNOSIS — I129 Hypertensive chronic kidney disease with stage 1 through stage 4 chronic kidney disease, or unspecified chronic kidney disease: Secondary | ICD-10-CM | POA: Diagnosis not present

## 2015-01-06 DIAGNOSIS — Z9221 Personal history of antineoplastic chemotherapy: Secondary | ICD-10-CM | POA: Diagnosis not present

## 2015-01-06 DIAGNOSIS — Z510 Encounter for antineoplastic radiation therapy: Secondary | ICD-10-CM | POA: Diagnosis not present

## 2015-01-06 LAB — GLUCOSE, CAPILLARY
GLUCOSE-CAPILLARY: 100 mg/dL — AB (ref 65–99)
GLUCOSE-CAPILLARY: 121 mg/dL — AB (ref 65–99)

## 2015-01-07 ENCOUNTER — Ambulatory Visit
Admission: RE | Admit: 2015-01-07 | Discharge: 2015-01-07 | Disposition: A | Discharge status: Home / Self Care | Payer: Medicare Other | Source: Ambulatory Visit | Attending: Radiation Oncology | Admitting: Radiation Oncology

## 2015-01-07 DIAGNOSIS — Z4889 Encounter for other specified surgical aftercare: Secondary | ICD-10-CM | POA: Diagnosis not present

## 2015-01-07 DIAGNOSIS — N189 Chronic kidney disease, unspecified: Secondary | ICD-10-CM | POA: Diagnosis not present

## 2015-01-07 DIAGNOSIS — I5043 Acute on chronic combined systolic (congestive) and diastolic (congestive) heart failure: Secondary | ICD-10-CM | POA: Diagnosis not present

## 2015-01-07 DIAGNOSIS — Z89419 Acquired absence of unspecified great toe: Secondary | ICD-10-CM | POA: Diagnosis not present

## 2015-01-07 DIAGNOSIS — M86672 Other chronic osteomyelitis, left ankle and foot: Secondary | ICD-10-CM | POA: Diagnosis not present

## 2015-01-07 DIAGNOSIS — Z9221 Personal history of antineoplastic chemotherapy: Secondary | ICD-10-CM | POA: Diagnosis not present

## 2015-01-07 DIAGNOSIS — Z510 Encounter for antineoplastic radiation therapy: Secondary | ICD-10-CM | POA: Diagnosis not present

## 2015-01-07 DIAGNOSIS — C50011 Malignant neoplasm of nipple and areola, right female breast: Secondary | ICD-10-CM | POA: Diagnosis not present

## 2015-01-07 DIAGNOSIS — E1122 Type 2 diabetes mellitus with diabetic chronic kidney disease: Secondary | ICD-10-CM | POA: Diagnosis not present

## 2015-01-07 DIAGNOSIS — Z171 Estrogen receptor negative status [ER-]: Secondary | ICD-10-CM | POA: Diagnosis not present

## 2015-01-07 DIAGNOSIS — I129 Hypertensive chronic kidney disease with stage 1 through stage 4 chronic kidney disease, or unspecified chronic kidney disease: Secondary | ICD-10-CM | POA: Diagnosis not present

## 2015-01-07 DIAGNOSIS — E11621 Type 2 diabetes mellitus with foot ulcer: Secondary | ICD-10-CM | POA: Diagnosis not present

## 2015-01-07 DIAGNOSIS — L97524 Non-pressure chronic ulcer of other part of left foot with necrosis of bone: Secondary | ICD-10-CM | POA: Diagnosis not present

## 2015-01-07 DIAGNOSIS — R269 Unspecified abnormalities of gait and mobility: Secondary | ICD-10-CM | POA: Diagnosis not present

## 2015-01-07 DIAGNOSIS — C50911 Malignant neoplasm of unspecified site of right female breast: Secondary | ICD-10-CM | POA: Diagnosis not present

## 2015-01-07 DIAGNOSIS — C50411 Malignant neoplasm of upper-outer quadrant of right female breast: Secondary | ICD-10-CM | POA: Diagnosis not present

## 2015-01-07 LAB — GLUCOSE, CAPILLARY
GLUCOSE-CAPILLARY: 107 mg/dL — AB (ref 65–99)
Glucose-Capillary: 128 mg/dL — ABNORMAL HIGH (ref 65–99)

## 2015-01-08 ENCOUNTER — Ambulatory Visit
Admission: RE | Admit: 2015-01-08 | Discharge: 2015-01-08 | Disposition: A | Discharge status: Home / Self Care | Payer: Medicare Other | Source: Ambulatory Visit | Attending: Radiation Oncology | Admitting: Radiation Oncology

## 2015-01-08 DIAGNOSIS — L97524 Non-pressure chronic ulcer of other part of left foot with necrosis of bone: Secondary | ICD-10-CM | POA: Diagnosis not present

## 2015-01-08 DIAGNOSIS — N189 Chronic kidney disease, unspecified: Secondary | ICD-10-CM | POA: Diagnosis not present

## 2015-01-08 DIAGNOSIS — C50011 Malignant neoplasm of nipple and areola, right female breast: Secondary | ICD-10-CM | POA: Diagnosis not present

## 2015-01-08 DIAGNOSIS — E11621 Type 2 diabetes mellitus with foot ulcer: Secondary | ICD-10-CM | POA: Diagnosis not present

## 2015-01-08 DIAGNOSIS — I5043 Acute on chronic combined systolic (congestive) and diastolic (congestive) heart failure: Secondary | ICD-10-CM | POA: Diagnosis not present

## 2015-01-08 DIAGNOSIS — Z9221 Personal history of antineoplastic chemotherapy: Secondary | ICD-10-CM | POA: Diagnosis not present

## 2015-01-08 DIAGNOSIS — E1122 Type 2 diabetes mellitus with diabetic chronic kidney disease: Secondary | ICD-10-CM | POA: Diagnosis not present

## 2015-01-08 DIAGNOSIS — Z89419 Acquired absence of unspecified great toe: Secondary | ICD-10-CM | POA: Diagnosis not present

## 2015-01-08 DIAGNOSIS — M86672 Other chronic osteomyelitis, left ankle and foot: Secondary | ICD-10-CM | POA: Diagnosis not present

## 2015-01-08 DIAGNOSIS — I129 Hypertensive chronic kidney disease with stage 1 through stage 4 chronic kidney disease, or unspecified chronic kidney disease: Secondary | ICD-10-CM | POA: Diagnosis not present

## 2015-01-08 DIAGNOSIS — R269 Unspecified abnormalities of gait and mobility: Secondary | ICD-10-CM | POA: Diagnosis not present

## 2015-01-08 DIAGNOSIS — Z4889 Encounter for other specified surgical aftercare: Secondary | ICD-10-CM | POA: Diagnosis not present

## 2015-01-08 DIAGNOSIS — C50411 Malignant neoplasm of upper-outer quadrant of right female breast: Secondary | ICD-10-CM | POA: Diagnosis not present

## 2015-01-08 DIAGNOSIS — Z171 Estrogen receptor negative status [ER-]: Secondary | ICD-10-CM | POA: Diagnosis not present

## 2015-01-08 DIAGNOSIS — C50911 Malignant neoplasm of unspecified site of right female breast: Secondary | ICD-10-CM | POA: Diagnosis not present

## 2015-01-08 DIAGNOSIS — Z510 Encounter for antineoplastic radiation therapy: Secondary | ICD-10-CM | POA: Diagnosis not present

## 2015-01-08 LAB — GLUCOSE, CAPILLARY
GLUCOSE-CAPILLARY: 99 mg/dL (ref 65–99)
GLUCOSE-CAPILLARY: 156 mg/dL — AB (ref 65–99)

## 2015-01-11 ENCOUNTER — Ambulatory Visit
Admission: RE | Admit: 2015-01-11 | Discharge: 2015-01-11 | Disposition: A | Discharge status: Home / Self Care | Payer: Medicare Other | Source: Ambulatory Visit | Attending: Radiation Oncology | Admitting: Radiation Oncology

## 2015-01-11 DIAGNOSIS — C50011 Malignant neoplasm of nipple and areola, right female breast: Secondary | ICD-10-CM | POA: Diagnosis not present

## 2015-01-11 DIAGNOSIS — M86672 Other chronic osteomyelitis, left ankle and foot: Secondary | ICD-10-CM | POA: Diagnosis not present

## 2015-01-11 DIAGNOSIS — I129 Hypertensive chronic kidney disease with stage 1 through stage 4 chronic kidney disease, or unspecified chronic kidney disease: Secondary | ICD-10-CM | POA: Diagnosis not present

## 2015-01-11 DIAGNOSIS — L97524 Non-pressure chronic ulcer of other part of left foot with necrosis of bone: Secondary | ICD-10-CM | POA: Diagnosis not present

## 2015-01-11 DIAGNOSIS — C50911 Malignant neoplasm of unspecified site of right female breast: Secondary | ICD-10-CM | POA: Diagnosis not present

## 2015-01-11 DIAGNOSIS — Z4889 Encounter for other specified surgical aftercare: Secondary | ICD-10-CM | POA: Diagnosis not present

## 2015-01-11 DIAGNOSIS — C50411 Malignant neoplasm of upper-outer quadrant of right female breast: Secondary | ICD-10-CM | POA: Diagnosis not present

## 2015-01-11 DIAGNOSIS — Z89419 Acquired absence of unspecified great toe: Secondary | ICD-10-CM | POA: Diagnosis not present

## 2015-01-11 DIAGNOSIS — Z9221 Personal history of antineoplastic chemotherapy: Secondary | ICD-10-CM | POA: Diagnosis not present

## 2015-01-11 DIAGNOSIS — Z510 Encounter for antineoplastic radiation therapy: Secondary | ICD-10-CM | POA: Diagnosis not present

## 2015-01-11 DIAGNOSIS — R269 Unspecified abnormalities of gait and mobility: Secondary | ICD-10-CM | POA: Diagnosis not present

## 2015-01-11 DIAGNOSIS — I5043 Acute on chronic combined systolic (congestive) and diastolic (congestive) heart failure: Secondary | ICD-10-CM | POA: Diagnosis not present

## 2015-01-11 DIAGNOSIS — Z171 Estrogen receptor negative status [ER-]: Secondary | ICD-10-CM | POA: Diagnosis not present

## 2015-01-11 DIAGNOSIS — E1122 Type 2 diabetes mellitus with diabetic chronic kidney disease: Secondary | ICD-10-CM | POA: Diagnosis not present

## 2015-01-11 DIAGNOSIS — E11621 Type 2 diabetes mellitus with foot ulcer: Secondary | ICD-10-CM | POA: Diagnosis not present

## 2015-01-11 DIAGNOSIS — N189 Chronic kidney disease, unspecified: Secondary | ICD-10-CM | POA: Diagnosis not present

## 2015-01-11 LAB — GLUCOSE, CAPILLARY
GLUCOSE-CAPILLARY: 113 mg/dL — AB (ref 65–99)
GLUCOSE-CAPILLARY: 131 mg/dL — AB (ref 65–99)

## 2015-01-12 ENCOUNTER — Ambulatory Visit
Admission: RE | Admit: 2015-01-12 | Discharge: 2015-01-12 | Disposition: A | Discharge status: Home / Self Care | Payer: Medicare Other | Source: Ambulatory Visit | Attending: Radiation Oncology | Admitting: Radiation Oncology

## 2015-01-12 ENCOUNTER — Encounter (HOSPITAL_COMMUNITY): Payer: Self-pay | Admitting: *Deleted

## 2015-01-12 ENCOUNTER — Telehealth: Payer: Self-pay | Admitting: Cardiovascular Disease

## 2015-01-12 ENCOUNTER — Other Ambulatory Visit: Payer: Self-pay

## 2015-01-12 ENCOUNTER — Emergency Department (HOSPITAL_COMMUNITY)
Admission: EM | Admit: 2015-01-12 | Discharge: 2015-01-12 | Disposition: A | Discharge status: Home / Self Care | Payer: Medicare Other | Attending: Emergency Medicine | Admitting: Emergency Medicine

## 2015-01-12 ENCOUNTER — Telehealth: Payer: Self-pay

## 2015-01-12 VITALS — BP 146/54 | HR 63 | Temp 98.3°F | Wt 158.6 lb

## 2015-01-12 DIAGNOSIS — Z79899 Other long term (current) drug therapy: Secondary | ICD-10-CM | POA: Diagnosis not present

## 2015-01-12 DIAGNOSIS — Z87442 Personal history of urinary calculi: Secondary | ICD-10-CM | POA: Diagnosis not present

## 2015-01-12 DIAGNOSIS — M86672 Other chronic osteomyelitis, left ankle and foot: Secondary | ICD-10-CM | POA: Diagnosis not present

## 2015-01-12 DIAGNOSIS — F419 Anxiety disorder, unspecified: Secondary | ICD-10-CM | POA: Insufficient documentation

## 2015-01-12 DIAGNOSIS — Z853 Personal history of malignant neoplasm of breast: Secondary | ICD-10-CM | POA: Insufficient documentation

## 2015-01-12 DIAGNOSIS — I129 Hypertensive chronic kidney disease with stage 1 through stage 4 chronic kidney disease, or unspecified chronic kidney disease: Secondary | ICD-10-CM | POA: Diagnosis not present

## 2015-01-12 DIAGNOSIS — Z510 Encounter for antineoplastic radiation therapy: Secondary | ICD-10-CM | POA: Diagnosis not present

## 2015-01-12 DIAGNOSIS — Z87891 Personal history of nicotine dependence: Secondary | ICD-10-CM | POA: Diagnosis not present

## 2015-01-12 DIAGNOSIS — Z7902 Long term (current) use of antithrombotics/antiplatelets: Secondary | ICD-10-CM | POA: Insufficient documentation

## 2015-01-12 DIAGNOSIS — C50411 Malignant neoplasm of upper-outer quadrant of right female breast: Secondary | ICD-10-CM

## 2015-01-12 DIAGNOSIS — Z8701 Personal history of pneumonia (recurrent): Secondary | ICD-10-CM | POA: Insufficient documentation

## 2015-01-12 DIAGNOSIS — Z8719 Personal history of other diseases of the digestive system: Secondary | ICD-10-CM | POA: Diagnosis not present

## 2015-01-12 DIAGNOSIS — L97524 Non-pressure chronic ulcer of other part of left foot with necrosis of bone: Secondary | ICD-10-CM | POA: Diagnosis not present

## 2015-01-12 DIAGNOSIS — C50911 Malignant neoplasm of unspecified site of right female breast: Secondary | ICD-10-CM | POA: Diagnosis not present

## 2015-01-12 DIAGNOSIS — N179 Acute kidney failure, unspecified: Secondary | ICD-10-CM | POA: Diagnosis not present

## 2015-01-12 DIAGNOSIS — R000 Tachycardia, unspecified: Secondary | ICD-10-CM | POA: Insufficient documentation

## 2015-01-12 DIAGNOSIS — Z9889 Other specified postprocedural states: Secondary | ICD-10-CM | POA: Insufficient documentation

## 2015-01-12 DIAGNOSIS — I471 Supraventricular tachycardia: Secondary | ICD-10-CM | POA: Diagnosis not present

## 2015-01-12 DIAGNOSIS — F329 Major depressive disorder, single episode, unspecified: Secondary | ICD-10-CM | POA: Insufficient documentation

## 2015-01-12 DIAGNOSIS — Z862 Personal history of diseases of the blood and blood-forming organs and certain disorders involving the immune mechanism: Secondary | ICD-10-CM | POA: Insufficient documentation

## 2015-01-12 DIAGNOSIS — Z171 Estrogen receptor negative status [ER-]: Secondary | ICD-10-CM | POA: Diagnosis not present

## 2015-01-12 DIAGNOSIS — Z9221 Personal history of antineoplastic chemotherapy: Secondary | ICD-10-CM | POA: Diagnosis not present

## 2015-01-12 DIAGNOSIS — E11621 Type 2 diabetes mellitus with foot ulcer: Secondary | ICD-10-CM | POA: Diagnosis not present

## 2015-01-12 DIAGNOSIS — I509 Heart failure, unspecified: Secondary | ICD-10-CM | POA: Diagnosis not present

## 2015-01-12 DIAGNOSIS — Z8619 Personal history of other infectious and parasitic diseases: Secondary | ICD-10-CM | POA: Diagnosis not present

## 2015-01-12 DIAGNOSIS — E1122 Type 2 diabetes mellitus with diabetic chronic kidney disease: Secondary | ICD-10-CM | POA: Diagnosis not present

## 2015-01-12 DIAGNOSIS — N189 Chronic kidney disease, unspecified: Secondary | ICD-10-CM | POA: Diagnosis not present

## 2015-01-12 LAB — BASIC METABOLIC PANEL
ANION GAP: 8 (ref 5–15)
BUN: 19 mg/dL (ref 6–20)
CALCIUM: 9.7 mg/dL (ref 8.9–10.3)
CO2: 28 mmol/L (ref 22–32)
Chloride: 108 mmol/L (ref 101–111)
Creatinine, Ser: 1.03 mg/dL — ABNORMAL HIGH (ref 0.44–1.00)
GFR, EST NON AFRICAN AMERICAN: 55 mL/min — AB (ref >60)
Glucose, Bld: 86 mg/dL (ref 65–99)
POTASSIUM: 4.1 mmol/L (ref 3.5–5.1)
SODIUM: 144 mmol/L (ref 135–145)

## 2015-01-12 LAB — CBC
HCT: 39.7 % (ref 36.0–46.0)
Hemoglobin: 12.6 g/dL (ref 12.0–15.0)
MCH: 28.3 pg (ref 26.0–34.0)
MCHC: 31.7 g/dL (ref 30.0–36.0)
MCV: 89 fL (ref 78.0–100.0)
PLATELETS: 175 10*3/uL (ref 150–400)
RBC: 4.46 MIL/uL (ref 3.87–5.11)
RDW: 15.7 % — AB (ref 11.5–15.5)
WBC: 4.4 10*3/uL (ref 4.0–10.5)

## 2015-01-12 LAB — I-STAT TROPONIN, ED: TROPONIN I, POC: 0.03 ng/mL (ref 0.00–0.08)

## 2015-01-12 LAB — GLUCOSE, CAPILLARY: GLUCOSE-CAPILLARY: 110 mg/dL — AB (ref 65–99)

## 2015-01-12 MED ORDER — SODIUM CHLORIDE 0.9 % IV SOLN
Freq: Once | INTRAVENOUS | Status: AC
  Administered 2015-01-12: 11:00:00 via INTRAVENOUS

## 2015-01-12 MED ORDER — DILTIAZEM HCL 25 MG/5ML IV SOLN
20.0000 mg | Freq: Once | INTRAVENOUS | Status: AC
  Administered 2015-01-12: 20 mg via INTRAVENOUS
  Filled 2015-01-12: qty 5

## 2015-01-12 MED ORDER — HEPARIN SOD (PORK) LOCK FLUSH 100 UNIT/ML IV SOLN
500.0000 [IU] | Freq: Once | INTRAVENOUS | Status: AC
  Administered 2015-01-12: 500 [IU]
  Filled 2015-01-12: qty 5

## 2015-01-12 NOTE — Telephone Encounter (Signed)
Nurse with [facility] called, states pt BP 157/115, HR 139.

## 2015-01-12 NOTE — ED Provider Notes (Signed)
CSN: [id number]     Arrival date & time 01/12/15  1027 History   First MD Initiated Contact with Patient 01/12/15 1028     Chief Complaint  Patient presents with  . Tachycardia  . Hypertension     (Consider location/radiation/quality/duration/timing/severity/associated sxs/prior Treatment) Patient is a 69 y.o. female presenting with general illness. The history is provided by the patient.  Illness Location:  Diffuse Quality:  Tachycardia Severity:  Moderate Onset quality:  Sudden Timing:  Constant Chronicity:  Recurrent Context:  Was at the wound center and found to be hypertensive and tachycardic. Just finished breast radiation. Relieved by:  Nothing Worsened by:  Nothing Associated symptoms: no abdominal pain, no chest pain, no cough, no fever, no shortness of breath and no vomiting     Past Medical History  Diagnosis Date  . Back pain   . H/O bladder problems   . Cholelithiasis   . Depression   . High blood pressure   . Hypercholesteremia   . Gum disease   . Wears glasses   . Wears dentures     top  . Cancer   . Breast cancer   . History of kidney stones     leaks  . Complication of anesthesia     bp goes up- now on Lopressor  . Dysrhythmia 08/2014    Afib- episode- was cardioverted in ED [facility]  . CHF (congestive heart failure)   . Pneumonia 05/2014  . History of kidney stones     paseed one, still has 2  . Anxiety     Panic attacks - when she was on Chanix- still has anxiety  . History of blood transfusion   . Shingles   . Thrombocytopenia 4/16  . Rhabdomyolysis 08/2014  . SVT (supraventricular tachycardia) 4/16  . ARF (acute renal failure) 4/16   Past Surgical History  Procedure Laterality Date  . Colon biopsy    . Mouth surgery      implants - denture attatchs to it  . Spine surgery    . Colonoscopy    . Tubal ligation    . Lithotripsy    . Rhinoplasty    . Back surgery  1989    lumb lam  . Breast biopsy Right 02/05/2014    invasive ductal cncer   . Abdominal hysterectomy  1982  . Cholecystectomy  2011    lap choli  . Radioactive seed guided mastectomy with axillary sentinel lymph node biopsy Right 02/27/2014    Procedure: RADIOACTIVE SEED GUIDED RIGHT PARTIAL MASTECTOMY WITH AXILLARY SENTINEL LYMPH NODE BIOPSY;  Surgeon: Stark Klein, MD;  Location: [facility];  Service: General;  Laterality: Right;  . Portacath placement N/A 02/27/2014    Procedure: INSERTION PORT-A-CATH;  Surgeon: Stark Klein, MD;  Location: [facility];  Service: General;  Laterality: N/A;  . Peripheral vascular catheterization N/A 09/28/2014    Procedure: Abdominal Aortogram;  Surgeon: Angelia Mould, MD;  Location: [facility];  Service: Cardiovascular;  Laterality: N/A;  . Amputation Left 12/01/2014    Procedure: AMPUTATION Left GREAT TOE;  Surgeon: Angelia Mould, MD;  Location: [facility] OR;  Service: Vascular;  Laterality: Left;   Family History  Problem Relation Age of Onset  . Stroke Mother     onset in her 3's  . Hypertension Mother   . Hyperlipidemia Mother   . Cancer - Other Father     eye cancer, black lung diseae  . Cancer Father   .  Heart failure Sister   . Hypertension Sister   . Hypertension Brother   . Heart attack Neg Hx    Social History  Substance Use Topics  . Smoking status: Former Smoker -- 0.00 packs/day for 25 years    Quit date: 05/16/2014  . Smokeless tobacco: None  . Alcohol Use: No   OB History    No data available     Review of Systems  Constitutional: Negative for fever.  Respiratory: Negative for cough and shortness of breath.   Cardiovascular: Negative for chest pain and leg swelling.  Gastrointestinal: Negative for vomiting and abdominal pain.  All other systems reviewed and are negative.     Allergies  Ambien; Ciprofloxacin; Demerol; Lidocaine; Other; Septra; Codeine; Novocain; and Vancomycin  Home Medications   Prior to Admission medications   Medication  Sig Start Date End Date Taking? Authorizing Provider  apixaban (ELIQUIS) 5 MG TABS tablet Take 1 tablet (5 mg total) by mouth 2 (two) times daily. 09/24/14   Thayer Headings, MD  diphenhydrAMINE (BENADRYL) 25 MG tablet Take 25 mg by mouth daily as needed for allergies (allergies).     Historical Provider, MD  FLUoxetine (PROZAC) 10 MG capsule Take 1 capsule (10 mg total) by mouth daily. 05/26/14   Hosie Poisson, MD  furosemide (LASIX) 40 MG tablet Take 1 tablet (40 mg total) by mouth daily. 09/04/14   Bonnielee Haff, MD  gabapentin (NEURONTIN) 100 MG capsule TAKE (1) TABLET BY MOUTH TWICE A DAY Patient taking differently: TAKE (1) TABLET BY MOUTH three times daily 01/01/15 12/25/14   Nicholas Lose, MD  KLOR-CON M10 10 MEQ tablet  10/22/14   Historical Provider, MD  LORazepam (ATIVAN) 0.5 MG tablet Take 1 tablet (0.5 mg total) by mouth every 8 (eight) hours as needed for anxiety. 12/23/14   Nicholas Lose, MD  metoprolol tartrate (LOPRESSOR) 25 MG tablet Take 1 tablet (25 mg total) by mouth 2 (two) times daily. 08/19/14   Debbe Odea, MD  oxyCODONE-acetaminophen (PERCOCET/ROXICET) 5-325 MG per tablet Take 1 tablet by mouth at bedtime as needed. 01/04/15   Nicholas Lose, MD  potassium chloride (MICRO-K) 10 MEQ CR capsule Take 1 capsule (10 mEq total) by mouth 2 (two) times daily. 10/27/14   Thayer Headings, MD  Probiotic Product (PROBIOTIC DAILY PO) Take 1 tablet by mouth daily.     Historical Provider, MD  promethazine (PHENERGAN) 12.5 MG suppository Place 12.5 mg rectally every 6 (six) hours as needed for nausea or vomiting.    Historical Provider, MD   BP 151/119 mmHg  Pulse 144  Temp(Src) 97.9 F (36.6 C) (Oral)  Resp 17  SpO2 99% Physical Exam  Constitutional: She is oriented to person, place, and time. She appears well-developed and well-nourished. No distress.  HENT:  Head: Normocephalic and atraumatic.  Mouth/Throat: Oropharynx is clear and moist.  Eyes: EOM are normal. Pupils are equal, round, and  reactive to light.  Neck: Normal range of motion. Neck supple.  Cardiovascular: Regular rhythm.  Tachycardia present.  Exam reveals no friction rub.   No murmur heard. Pulmonary/Chest: Effort normal and breath sounds normal. No respiratory distress. She has no wheezes. She has no rales.  Abdominal: Soft. She exhibits no distension. There is no tenderness. There is no rebound.  Musculoskeletal: Normal range of motion. She exhibits no edema.  Neurological: She is alert and oriented to person, place, and time.  Skin: She is not diaphoretic.  Nursing note and vitals reviewed.   ED Course  Procedures (including critical care time) Labs Review Labs Reviewed  CBC - Abnormal; Notable for the following:    RDW 15.7 (*)    All other components within normal limits  BASIC METABOLIC PANEL - Abnormal; Notable for the following:    Creatinine, Ser 1.03 (*)    GFR calc non Af Amer 55 (*)    All other components within normal limits  I-STAT TROPOININ, ED    Imaging Review No results found. I have personally reviewed and evaluated these images and lab results as part of my medical decision-making.   EKG Interpretation None      ED ECG REPORT   Date: 01/12/2015  Rate: 143  Rhythm: junctional tachycardia  QRS Axis: normal  Intervals: normal  ST/T Wave abnormalities: normal  Conduction Disutrbances:none  Narrative Interpretation:   Old EKG Reviewed: changes noted and junctional tachy new  I have personally reviewed the EKG tracing and agree with the computerized printout as noted.   MDM   Final diagnoses:  Junctional tachycardia    84F here with tachycardia. Was at the wound center, had just finished breast radiation. Patient denies CP, SOB, feeling palpitations. Hx of Atrial Fib, has been converted before. Is on eliquis and metoprolol. Junctional tachycardia with a rate in the 140s here. No relief with vagal maneuvers.  Will give IV dilt.  HR improved with Dilt. No further  arrhythmias. Stable for discharge.  Evelina Bucy, MD 01/12/15 [phone]

## 2015-01-12 NOTE — Progress Notes (Signed)
Weekly Management Note Current Dose: 27  Gy  Projected Dose: 61 Gy   Narrative:  The patient presents for routine under treatment assessment.  CBCT/MVCT images/Port film x-rays were reviewed.  The chart was checked. Doing well. No complaints.    Physical Findings: Weight: 158 lb 9.6 oz (71.94 kg). Unchanged skin.   Impression:  The patient is tolerating radiation.  Plan:  Continue treatment as planned. Continue radiaplex.

## 2015-01-12 NOTE — Telephone Encounter (Signed)
Spoke with patient who states she went to the ED today with tachycardia; states she was told to follow-up with her cardiologist.  Dr. Acie Fredrickson, who is in the office, reviewed the ekg from and advised patient had SVT, which upon further review was documented to resolve with IV diltiazem.  He advised that she may take 1/2 extra Metoprolol 25 mg and may try vagal maneuvers if symptoms reoccur.  He advised office visit is not necessary but to call back if it occurs again or if she has other concerns.  She verbalized understanding and agreement with plan of care and thanked me for the call.

## 2015-01-12 NOTE — Progress Notes (Signed)
Completed 15 of 25 radiation treatments to right breast.Skin is pink.denies pain.

## 2015-01-12 NOTE — Telephone Encounter (Signed)
Per Alysia Penna, RN, advised Nurse manager with [facility] advised to go to ED

## 2015-01-12 NOTE — ED Notes (Signed)
Bed: [facility] Expected date:  Expected time:  Means of arrival:  Comments: Pt from [facility]

## 2015-01-12 NOTE — Telephone Encounter (Signed)
New message     Pt was seen in the e/r this morning with tachycardia Pt states pulse is normal now Please call to discuss

## 2015-01-12 NOTE — ED Notes (Signed)
Per staff at the wound care center pt was there for hyperbaric treatment when she became tachycardic at 140s.  Reports pt is mildly fatigued but denies any SOB, cp or dizziness at this time.

## 2015-01-12 NOTE — ED Notes (Signed)
Pt was brought in from the wound care center.  While having a hyperbaric treatment, pt became hypertensive and tachycardic at 140s.  Pt denies any SOB, cp or dizziness at this time.  Pt is A&O x 4.   Pt reports that this always happens when she has this tx.  Has hyperbaric tx for her amputated toe Monday-Friday for 2 hours each day.

## 2015-01-13 ENCOUNTER — Ambulatory Visit
Admission: RE | Admit: 2015-01-13 | Discharge: 2015-01-13 | Disposition: A | Discharge status: Home / Self Care | Payer: Medicare Other | Source: Ambulatory Visit | Attending: Radiation Oncology | Admitting: Radiation Oncology

## 2015-01-13 DIAGNOSIS — C50911 Malignant neoplasm of unspecified site of right female breast: Secondary | ICD-10-CM | POA: Diagnosis not present

## 2015-01-13 DIAGNOSIS — M86672 Other chronic osteomyelitis, left ankle and foot: Secondary | ICD-10-CM | POA: Diagnosis not present

## 2015-01-13 DIAGNOSIS — C50411 Malignant neoplasm of upper-outer quadrant of right female breast: Secondary | ICD-10-CM | POA: Diagnosis not present

## 2015-01-13 DIAGNOSIS — L97522 Non-pressure chronic ulcer of other part of left foot with fat layer exposed: Secondary | ICD-10-CM | POA: Diagnosis not present

## 2015-01-13 DIAGNOSIS — N189 Chronic kidney disease, unspecified: Secondary | ICD-10-CM | POA: Diagnosis not present

## 2015-01-13 DIAGNOSIS — L97524 Non-pressure chronic ulcer of other part of left foot with necrosis of bone: Secondary | ICD-10-CM | POA: Diagnosis not present

## 2015-01-13 DIAGNOSIS — Z510 Encounter for antineoplastic radiation therapy: Secondary | ICD-10-CM | POA: Diagnosis not present

## 2015-01-13 DIAGNOSIS — Z9221 Personal history of antineoplastic chemotherapy: Secondary | ICD-10-CM | POA: Diagnosis not present

## 2015-01-13 DIAGNOSIS — E1122 Type 2 diabetes mellitus with diabetic chronic kidney disease: Secondary | ICD-10-CM | POA: Diagnosis not present

## 2015-01-13 DIAGNOSIS — E11621 Type 2 diabetes mellitus with foot ulcer: Secondary | ICD-10-CM | POA: Diagnosis not present

## 2015-01-13 DIAGNOSIS — I129 Hypertensive chronic kidney disease with stage 1 through stage 4 chronic kidney disease, or unspecified chronic kidney disease: Secondary | ICD-10-CM | POA: Diagnosis not present

## 2015-01-13 DIAGNOSIS — Z171 Estrogen receptor negative status [ER-]: Secondary | ICD-10-CM | POA: Diagnosis not present

## 2015-01-13 LAB — GLUCOSE, CAPILLARY
GLUCOSE-CAPILLARY: 110 mg/dL — AB (ref 65–99)
GLUCOSE-CAPILLARY: 91 mg/dL (ref 65–99)

## 2015-01-14 ENCOUNTER — Ambulatory Visit
Admission: RE | Admit: 2015-01-14 | Discharge: 2015-01-14 | Disposition: A | Discharge status: Home / Self Care | Payer: Medicare Other | Source: Ambulatory Visit | Attending: Radiation Oncology | Admitting: Radiation Oncology

## 2015-01-14 ENCOUNTER — Encounter (HOSPITAL_BASED_OUTPATIENT_CLINIC_OR_DEPARTMENT_OTHER): Payer: Medicare Other | Attending: Surgery

## 2015-01-14 ENCOUNTER — Other Ambulatory Visit: Payer: Self-pay | Admitting: Hematology and Oncology

## 2015-01-14 DIAGNOSIS — E1140 Type 2 diabetes mellitus with diabetic neuropathy, unspecified: Secondary | ICD-10-CM | POA: Insufficient documentation

## 2015-01-14 DIAGNOSIS — E1122 Type 2 diabetes mellitus with diabetic chronic kidney disease: Secondary | ICD-10-CM | POA: Diagnosis not present

## 2015-01-14 DIAGNOSIS — C50411 Malignant neoplasm of upper-outer quadrant of right female breast: Secondary | ICD-10-CM

## 2015-01-14 DIAGNOSIS — C50011 Malignant neoplasm of nipple and areola, right female breast: Secondary | ICD-10-CM | POA: Diagnosis not present

## 2015-01-14 DIAGNOSIS — Z171 Estrogen receptor negative status [ER-]: Secondary | ICD-10-CM | POA: Diagnosis not present

## 2015-01-14 DIAGNOSIS — Z4889 Encounter for other specified surgical aftercare: Secondary | ICD-10-CM | POA: Diagnosis not present

## 2015-01-14 DIAGNOSIS — N189 Chronic kidney disease, unspecified: Secondary | ICD-10-CM | POA: Diagnosis not present

## 2015-01-14 DIAGNOSIS — Z87892 Personal history of anaphylaxis: Secondary | ICD-10-CM | POA: Diagnosis not present

## 2015-01-14 DIAGNOSIS — Z89419 Acquired absence of unspecified great toe: Secondary | ICD-10-CM | POA: Diagnosis not present

## 2015-01-14 DIAGNOSIS — M86672 Other chronic osteomyelitis, left ankle and foot: Secondary | ICD-10-CM | POA: Diagnosis not present

## 2015-01-14 DIAGNOSIS — Z9221 Personal history of antineoplastic chemotherapy: Secondary | ICD-10-CM | POA: Diagnosis not present

## 2015-01-14 DIAGNOSIS — C50911 Malignant neoplasm of unspecified site of right female breast: Secondary | ICD-10-CM | POA: Diagnosis not present

## 2015-01-14 DIAGNOSIS — I129 Hypertensive chronic kidney disease with stage 1 through stage 4 chronic kidney disease, or unspecified chronic kidney disease: Secondary | ICD-10-CM | POA: Insufficient documentation

## 2015-01-14 DIAGNOSIS — Z510 Encounter for antineoplastic radiation therapy: Secondary | ICD-10-CM | POA: Diagnosis not present

## 2015-01-14 DIAGNOSIS — R269 Unspecified abnormalities of gait and mobility: Secondary | ICD-10-CM | POA: Diagnosis not present

## 2015-01-14 DIAGNOSIS — Z89412 Acquired absence of left great toe: Secondary | ICD-10-CM | POA: Diagnosis not present

## 2015-01-14 DIAGNOSIS — I5043 Acute on chronic combined systolic (congestive) and diastolic (congestive) heart failure: Secondary | ICD-10-CM | POA: Diagnosis not present

## 2015-01-14 LAB — GLUCOSE, CAPILLARY
GLUCOSE-CAPILLARY: 109 mg/dL — AB (ref 65–99)
Glucose-Capillary: 120 mg/dL — ABNORMAL HIGH (ref 65–99)

## 2015-01-14 MED ORDER — LORAZEPAM 0.5 MG PO TABS: 0.5000 mg | ORAL_TABLET | Freq: Three times a day (TID) | ORAL | Status: DC | PRN

## 2015-01-15 ENCOUNTER — Ambulatory Visit
Admission: RE | Admit: 2015-01-15 | Discharge: 2015-01-15 | Disposition: A | Discharge status: Home / Self Care | Payer: Medicare Other | Source: Ambulatory Visit | Attending: Radiation Oncology | Admitting: Radiation Oncology

## 2015-01-15 DIAGNOSIS — Z9221 Personal history of antineoplastic chemotherapy: Secondary | ICD-10-CM | POA: Diagnosis not present

## 2015-01-15 DIAGNOSIS — E1140 Type 2 diabetes mellitus with diabetic neuropathy, unspecified: Secondary | ICD-10-CM | POA: Diagnosis not present

## 2015-01-15 DIAGNOSIS — Z510 Encounter for antineoplastic radiation therapy: Secondary | ICD-10-CM | POA: Diagnosis not present

## 2015-01-15 DIAGNOSIS — M86672 Other chronic osteomyelitis, left ankle and foot: Secondary | ICD-10-CM | POA: Diagnosis not present

## 2015-01-15 DIAGNOSIS — E1122 Type 2 diabetes mellitus with diabetic chronic kidney disease: Secondary | ICD-10-CM | POA: Diagnosis not present

## 2015-01-15 DIAGNOSIS — N189 Chronic kidney disease, unspecified: Secondary | ICD-10-CM | POA: Diagnosis not present

## 2015-01-15 DIAGNOSIS — Z89412 Acquired absence of left great toe: Secondary | ICD-10-CM | POA: Diagnosis not present

## 2015-01-15 DIAGNOSIS — Z87892 Personal history of anaphylaxis: Secondary | ICD-10-CM | POA: Diagnosis not present

## 2015-01-15 DIAGNOSIS — Z171 Estrogen receptor negative status [ER-]: Secondary | ICD-10-CM | POA: Diagnosis not present

## 2015-01-15 DIAGNOSIS — C50411 Malignant neoplasm of upper-outer quadrant of right female breast: Secondary | ICD-10-CM | POA: Diagnosis not present

## 2015-01-15 DIAGNOSIS — C50911 Malignant neoplasm of unspecified site of right female breast: Secondary | ICD-10-CM | POA: Diagnosis not present

## 2015-01-15 LAB — GLUCOSE, CAPILLARY
GLUCOSE-CAPILLARY: 107 mg/dL — AB (ref 65–99)
GLUCOSE-CAPILLARY: 125 mg/dL — AB (ref 65–99)

## 2015-01-19 ENCOUNTER — Ambulatory Visit
Admission: RE | Admit: 2015-01-19 | Discharge: 2015-01-19 | Disposition: A | Discharge status: Home / Self Care | Payer: Medicare Other | Source: Ambulatory Visit | Attending: Radiation Oncology | Admitting: Radiation Oncology

## 2015-01-19 VITALS — BP 157/73 | HR 61 | Temp 98.1°F | Wt 158.7 lb

## 2015-01-19 DIAGNOSIS — I129 Hypertensive chronic kidney disease with stage 1 through stage 4 chronic kidney disease, or unspecified chronic kidney disease: Secondary | ICD-10-CM | POA: Diagnosis not present

## 2015-01-19 DIAGNOSIS — M86672 Other chronic osteomyelitis, left ankle and foot: Secondary | ICD-10-CM | POA: Diagnosis not present

## 2015-01-19 DIAGNOSIS — Z89419 Acquired absence of unspecified great toe: Secondary | ICD-10-CM | POA: Diagnosis not present

## 2015-01-19 DIAGNOSIS — E1122 Type 2 diabetes mellitus with diabetic chronic kidney disease: Secondary | ICD-10-CM | POA: Diagnosis not present

## 2015-01-19 DIAGNOSIS — I5043 Acute on chronic combined systolic (congestive) and diastolic (congestive) heart failure: Secondary | ICD-10-CM | POA: Diagnosis not present

## 2015-01-19 DIAGNOSIS — Z87892 Personal history of anaphylaxis: Secondary | ICD-10-CM | POA: Diagnosis not present

## 2015-01-19 DIAGNOSIS — C50411 Malignant neoplasm of upper-outer quadrant of right female breast: Secondary | ICD-10-CM | POA: Diagnosis not present

## 2015-01-19 DIAGNOSIS — N189 Chronic kidney disease, unspecified: Secondary | ICD-10-CM | POA: Diagnosis not present

## 2015-01-19 DIAGNOSIS — C50911 Malignant neoplasm of unspecified site of right female breast: Secondary | ICD-10-CM | POA: Diagnosis not present

## 2015-01-19 DIAGNOSIS — C50011 Malignant neoplasm of nipple and areola, right female breast: Secondary | ICD-10-CM | POA: Diagnosis not present

## 2015-01-19 DIAGNOSIS — Z510 Encounter for antineoplastic radiation therapy: Secondary | ICD-10-CM | POA: Diagnosis not present

## 2015-01-19 DIAGNOSIS — Z9221 Personal history of antineoplastic chemotherapy: Secondary | ICD-10-CM | POA: Diagnosis not present

## 2015-01-19 DIAGNOSIS — R269 Unspecified abnormalities of gait and mobility: Secondary | ICD-10-CM | POA: Diagnosis not present

## 2015-01-19 DIAGNOSIS — Z89412 Acquired absence of left great toe: Secondary | ICD-10-CM | POA: Diagnosis not present

## 2015-01-19 DIAGNOSIS — Z4889 Encounter for other specified surgical aftercare: Secondary | ICD-10-CM | POA: Diagnosis not present

## 2015-01-19 DIAGNOSIS — Z171 Estrogen receptor negative status [ER-]: Secondary | ICD-10-CM | POA: Diagnosis not present

## 2015-01-19 DIAGNOSIS — E1140 Type 2 diabetes mellitus with diabetic neuropathy, unspecified: Secondary | ICD-10-CM | POA: Diagnosis not present

## 2015-01-19 LAB — GLUCOSE, CAPILLARY
GLUCOSE-CAPILLARY: 125 mg/dL — AB (ref 65–99)
GLUCOSE-CAPILLARY: 90 mg/dL (ref 65–99)

## 2015-01-19 NOTE — Progress Notes (Signed)
Weekly assessment of radiation to right breast.Completed 19 of 25 treatments.Skin looks good.Mild discoloration.Continue application of radiaplex.Shooting breast pain otherwise doing fine.Chronic fatigue.Sent to emergency department last week for pulse of 143 which was corrected by medication and sent home.

## 2015-01-19 NOTE — Progress Notes (Signed)
Weekly Management Note Current Dose: 34.2  Gy  Projected Dose: 61 Gy   Narrative:  The patient presents for routine under treatment assessment.  CBCT/MVCT images/Port film x-rays were reviewed.  The chart was checked. Doing well. No complaints.  Foot is well healed. Neuropathy continues. Tachycardia after visit last week sent her to ER. Cardiologist told her to tak ea half a BP pill next time.   Physical Findings: Weight: 158 lb 11.2 oz (71.986 kg). Unchanged skin.   Impression:  The patient is tolerating radiation.  Plan:  Continue treatment as planned. Continue radiaplex. Continue follow up with cardiology.

## 2015-01-20 ENCOUNTER — Ambulatory Visit
Admission: RE | Admit: 2015-01-20 | Discharge: 2015-01-20 | Disposition: A | Discharge status: Home / Self Care | Payer: Medicare Other | Source: Ambulatory Visit | Attending: Radiation Oncology | Admitting: Radiation Oncology

## 2015-01-20 DIAGNOSIS — M8668 Other chronic osteomyelitis, other site: Secondary | ICD-10-CM | POA: Diagnosis not present

## 2015-01-20 DIAGNOSIS — C50411 Malignant neoplasm of upper-outer quadrant of right female breast: Secondary | ICD-10-CM | POA: Diagnosis not present

## 2015-01-20 DIAGNOSIS — Z171 Estrogen receptor negative status [ER-]: Secondary | ICD-10-CM | POA: Diagnosis not present

## 2015-01-20 DIAGNOSIS — N189 Chronic kidney disease, unspecified: Secondary | ICD-10-CM | POA: Diagnosis not present

## 2015-01-20 DIAGNOSIS — L97521 Non-pressure chronic ulcer of other part of left foot limited to breakdown of skin: Secondary | ICD-10-CM | POA: Diagnosis not present

## 2015-01-20 DIAGNOSIS — M86672 Other chronic osteomyelitis, left ankle and foot: Secondary | ICD-10-CM | POA: Diagnosis not present

## 2015-01-20 DIAGNOSIS — E11621 Type 2 diabetes mellitus with foot ulcer: Secondary | ICD-10-CM | POA: Diagnosis not present

## 2015-01-20 DIAGNOSIS — Z9221 Personal history of antineoplastic chemotherapy: Secondary | ICD-10-CM | POA: Diagnosis not present

## 2015-01-20 DIAGNOSIS — Z510 Encounter for antineoplastic radiation therapy: Secondary | ICD-10-CM | POA: Diagnosis not present

## 2015-01-20 DIAGNOSIS — C50911 Malignant neoplasm of unspecified site of right female breast: Secondary | ICD-10-CM | POA: Diagnosis not present

## 2015-01-20 DIAGNOSIS — Z87892 Personal history of anaphylaxis: Secondary | ICD-10-CM | POA: Diagnosis not present

## 2015-01-20 DIAGNOSIS — Z89412 Acquired absence of left great toe: Secondary | ICD-10-CM | POA: Diagnosis not present

## 2015-01-20 DIAGNOSIS — E1122 Type 2 diabetes mellitus with diabetic chronic kidney disease: Secondary | ICD-10-CM | POA: Diagnosis not present

## 2015-01-20 DIAGNOSIS — E1140 Type 2 diabetes mellitus with diabetic neuropathy, unspecified: Secondary | ICD-10-CM | POA: Diagnosis not present

## 2015-01-20 LAB — GLUCOSE, CAPILLARY
Glucose-Capillary: 82 mg/dL (ref 65–99)
Glucose-Capillary: 82 mg/dL (ref 65–99)
Glucose-Capillary: 131 mg/dL — ABNORMAL HIGH (ref 65–99)
Glucose-Capillary: 112 mg/dL — ABNORMAL HIGH (ref 65–99)

## 2015-01-21 ENCOUNTER — Ambulatory Visit
Admission: RE | Admit: 2015-01-21 | Discharge: 2015-01-21 | Disposition: A | Discharge status: Home / Self Care | Payer: Medicare Other | Source: Ambulatory Visit | Attending: Radiation Oncology | Admitting: Radiation Oncology

## 2015-01-21 DIAGNOSIS — E1140 Type 2 diabetes mellitus with diabetic neuropathy, unspecified: Secondary | ICD-10-CM | POA: Diagnosis not present

## 2015-01-21 DIAGNOSIS — I5043 Acute on chronic combined systolic (congestive) and diastolic (congestive) heart failure: Secondary | ICD-10-CM | POA: Diagnosis not present

## 2015-01-21 DIAGNOSIS — R269 Unspecified abnormalities of gait and mobility: Secondary | ICD-10-CM | POA: Diagnosis not present

## 2015-01-21 DIAGNOSIS — Z4889 Encounter for other specified surgical aftercare: Secondary | ICD-10-CM | POA: Diagnosis not present

## 2015-01-21 DIAGNOSIS — Z89412 Acquired absence of left great toe: Secondary | ICD-10-CM | POA: Diagnosis not present

## 2015-01-21 DIAGNOSIS — Z89419 Acquired absence of unspecified great toe: Secondary | ICD-10-CM | POA: Diagnosis not present

## 2015-01-21 DIAGNOSIS — C50911 Malignant neoplasm of unspecified site of right female breast: Secondary | ICD-10-CM | POA: Diagnosis not present

## 2015-01-21 DIAGNOSIS — C50011 Malignant neoplasm of nipple and areola, right female breast: Secondary | ICD-10-CM | POA: Diagnosis not present

## 2015-01-21 DIAGNOSIS — I129 Hypertensive chronic kidney disease with stage 1 through stage 4 chronic kidney disease, or unspecified chronic kidney disease: Secondary | ICD-10-CM | POA: Diagnosis not present

## 2015-01-21 DIAGNOSIS — Z87892 Personal history of anaphylaxis: Secondary | ICD-10-CM | POA: Diagnosis not present

## 2015-01-21 DIAGNOSIS — Z171 Estrogen receptor negative status [ER-]: Secondary | ICD-10-CM | POA: Diagnosis not present

## 2015-01-21 DIAGNOSIS — N189 Chronic kidney disease, unspecified: Secondary | ICD-10-CM | POA: Diagnosis not present

## 2015-01-21 DIAGNOSIS — C50411 Malignant neoplasm of upper-outer quadrant of right female breast: Secondary | ICD-10-CM | POA: Diagnosis not present

## 2015-01-21 DIAGNOSIS — E1122 Type 2 diabetes mellitus with diabetic chronic kidney disease: Secondary | ICD-10-CM | POA: Diagnosis not present

## 2015-01-21 DIAGNOSIS — M86672 Other chronic osteomyelitis, left ankle and foot: Secondary | ICD-10-CM | POA: Diagnosis not present

## 2015-01-21 DIAGNOSIS — Z9221 Personal history of antineoplastic chemotherapy: Secondary | ICD-10-CM | POA: Diagnosis not present

## 2015-01-21 DIAGNOSIS — Z510 Encounter for antineoplastic radiation therapy: Secondary | ICD-10-CM | POA: Diagnosis not present

## 2015-01-21 LAB — GLUCOSE, CAPILLARY
GLUCOSE-CAPILLARY: 103 mg/dL — AB (ref 65–99)
GLUCOSE-CAPILLARY: 114 mg/dL — AB (ref 65–99)

## 2015-01-22 ENCOUNTER — Ambulatory Visit
Admission: RE | Admit: 2015-01-22 | Discharge: 2015-01-22 | Disposition: A | Discharge status: Home / Self Care | Payer: Medicare Other | Source: Ambulatory Visit | Attending: Radiation Oncology | Admitting: Radiation Oncology

## 2015-01-22 DIAGNOSIS — M86672 Other chronic osteomyelitis, left ankle and foot: Secondary | ICD-10-CM | POA: Diagnosis not present

## 2015-01-22 DIAGNOSIS — Z171 Estrogen receptor negative status [ER-]: Secondary | ICD-10-CM | POA: Diagnosis not present

## 2015-01-22 DIAGNOSIS — Z89412 Acquired absence of left great toe: Secondary | ICD-10-CM | POA: Diagnosis not present

## 2015-01-22 DIAGNOSIS — E1140 Type 2 diabetes mellitus with diabetic neuropathy, unspecified: Secondary | ICD-10-CM | POA: Diagnosis not present

## 2015-01-22 DIAGNOSIS — Z9221 Personal history of antineoplastic chemotherapy: Secondary | ICD-10-CM | POA: Diagnosis not present

## 2015-01-22 DIAGNOSIS — C50911 Malignant neoplasm of unspecified site of right female breast: Secondary | ICD-10-CM | POA: Diagnosis not present

## 2015-01-22 DIAGNOSIS — E1122 Type 2 diabetes mellitus with diabetic chronic kidney disease: Secondary | ICD-10-CM | POA: Diagnosis not present

## 2015-01-22 DIAGNOSIS — N189 Chronic kidney disease, unspecified: Secondary | ICD-10-CM | POA: Diagnosis not present

## 2015-01-22 DIAGNOSIS — Z510 Encounter for antineoplastic radiation therapy: Secondary | ICD-10-CM | POA: Diagnosis not present

## 2015-01-22 DIAGNOSIS — Z87892 Personal history of anaphylaxis: Secondary | ICD-10-CM | POA: Diagnosis not present

## 2015-01-22 DIAGNOSIS — C50411 Malignant neoplasm of upper-outer quadrant of right female breast: Secondary | ICD-10-CM | POA: Diagnosis not present

## 2015-01-22 LAB — GLUCOSE, CAPILLARY
GLUCOSE-CAPILLARY: 107 mg/dL — AB (ref 65–99)
Glucose-Capillary: 111 mg/dL — ABNORMAL HIGH (ref 65–99)
Glucose-Capillary: 98 mg/dL (ref 65–99)

## 2015-01-25 ENCOUNTER — Ambulatory Visit
Admission: RE | Admit: 2015-01-25 | Discharge: 2015-01-25 | Disposition: A | Discharge status: Home / Self Care | Payer: Medicare Other | Source: Ambulatory Visit | Attending: Radiation Oncology | Admitting: Radiation Oncology

## 2015-01-25 DIAGNOSIS — Z171 Estrogen receptor negative status [ER-]: Secondary | ICD-10-CM | POA: Diagnosis not present

## 2015-01-25 DIAGNOSIS — N189 Chronic kidney disease, unspecified: Secondary | ICD-10-CM | POA: Diagnosis not present

## 2015-01-25 DIAGNOSIS — C50911 Malignant neoplasm of unspecified site of right female breast: Secondary | ICD-10-CM | POA: Diagnosis not present

## 2015-01-25 DIAGNOSIS — Z89412 Acquired absence of left great toe: Secondary | ICD-10-CM | POA: Diagnosis not present

## 2015-01-25 DIAGNOSIS — E1122 Type 2 diabetes mellitus with diabetic chronic kidney disease: Secondary | ICD-10-CM | POA: Diagnosis not present

## 2015-01-25 DIAGNOSIS — E1140 Type 2 diabetes mellitus with diabetic neuropathy, unspecified: Secondary | ICD-10-CM | POA: Diagnosis not present

## 2015-01-25 DIAGNOSIS — R269 Unspecified abnormalities of gait and mobility: Secondary | ICD-10-CM | POA: Diagnosis not present

## 2015-01-25 DIAGNOSIS — Z87892 Personal history of anaphylaxis: Secondary | ICD-10-CM | POA: Diagnosis not present

## 2015-01-25 DIAGNOSIS — Z4889 Encounter for other specified surgical aftercare: Secondary | ICD-10-CM | POA: Diagnosis not present

## 2015-01-25 DIAGNOSIS — Z9221 Personal history of antineoplastic chemotherapy: Secondary | ICD-10-CM | POA: Diagnosis not present

## 2015-01-25 DIAGNOSIS — Z510 Encounter for antineoplastic radiation therapy: Secondary | ICD-10-CM | POA: Diagnosis not present

## 2015-01-25 DIAGNOSIS — C50011 Malignant neoplasm of nipple and areola, right female breast: Secondary | ICD-10-CM | POA: Diagnosis not present

## 2015-01-25 DIAGNOSIS — I5043 Acute on chronic combined systolic (congestive) and diastolic (congestive) heart failure: Secondary | ICD-10-CM | POA: Diagnosis not present

## 2015-01-25 DIAGNOSIS — Z89419 Acquired absence of unspecified great toe: Secondary | ICD-10-CM | POA: Diagnosis not present

## 2015-01-25 DIAGNOSIS — C50411 Malignant neoplasm of upper-outer quadrant of right female breast: Secondary | ICD-10-CM | POA: Diagnosis not present

## 2015-01-25 DIAGNOSIS — M86672 Other chronic osteomyelitis, left ankle and foot: Secondary | ICD-10-CM | POA: Diagnosis not present

## 2015-01-25 DIAGNOSIS — I129 Hypertensive chronic kidney disease with stage 1 through stage 4 chronic kidney disease, or unspecified chronic kidney disease: Secondary | ICD-10-CM | POA: Diagnosis not present

## 2015-01-25 LAB — GLUCOSE, CAPILLARY
GLUCOSE-CAPILLARY: 119 mg/dL — AB (ref 65–99)
GLUCOSE-CAPILLARY: 123 mg/dL — AB (ref 65–99)

## 2015-01-26 ENCOUNTER — Ambulatory Visit
Admission: RE | Admit: 2015-01-26 | Discharge: 2015-01-26 | Disposition: A | Discharge status: Home / Self Care | Payer: Medicare Other | Source: Ambulatory Visit | Attending: Radiation Oncology | Admitting: Radiation Oncology

## 2015-01-26 DIAGNOSIS — Z89412 Acquired absence of left great toe: Secondary | ICD-10-CM | POA: Diagnosis not present

## 2015-01-26 DIAGNOSIS — C50011 Malignant neoplasm of nipple and areola, right female breast: Secondary | ICD-10-CM | POA: Diagnosis not present

## 2015-01-26 DIAGNOSIS — E1140 Type 2 diabetes mellitus with diabetic neuropathy, unspecified: Secondary | ICD-10-CM | POA: Diagnosis not present

## 2015-01-26 DIAGNOSIS — C50411 Malignant neoplasm of upper-outer quadrant of right female breast: Secondary | ICD-10-CM | POA: Diagnosis not present

## 2015-01-26 DIAGNOSIS — N189 Chronic kidney disease, unspecified: Secondary | ICD-10-CM | POA: Diagnosis not present

## 2015-01-26 DIAGNOSIS — Z510 Encounter for antineoplastic radiation therapy: Secondary | ICD-10-CM | POA: Diagnosis not present

## 2015-01-26 DIAGNOSIS — Z4889 Encounter for other specified surgical aftercare: Secondary | ICD-10-CM | POA: Diagnosis not present

## 2015-01-26 DIAGNOSIS — Z89419 Acquired absence of unspecified great toe: Secondary | ICD-10-CM | POA: Diagnosis not present

## 2015-01-26 DIAGNOSIS — I129 Hypertensive chronic kidney disease with stage 1 through stage 4 chronic kidney disease, or unspecified chronic kidney disease: Secondary | ICD-10-CM | POA: Diagnosis not present

## 2015-01-26 DIAGNOSIS — E1122 Type 2 diabetes mellitus with diabetic chronic kidney disease: Secondary | ICD-10-CM | POA: Diagnosis not present

## 2015-01-26 DIAGNOSIS — I5043 Acute on chronic combined systolic (congestive) and diastolic (congestive) heart failure: Secondary | ICD-10-CM | POA: Diagnosis not present

## 2015-01-26 DIAGNOSIS — Z9221 Personal history of antineoplastic chemotherapy: Secondary | ICD-10-CM | POA: Diagnosis not present

## 2015-01-26 DIAGNOSIS — C50911 Malignant neoplasm of unspecified site of right female breast: Secondary | ICD-10-CM | POA: Diagnosis not present

## 2015-01-26 DIAGNOSIS — R269 Unspecified abnormalities of gait and mobility: Secondary | ICD-10-CM | POA: Diagnosis not present

## 2015-01-26 DIAGNOSIS — M86672 Other chronic osteomyelitis, left ankle and foot: Secondary | ICD-10-CM | POA: Diagnosis not present

## 2015-01-26 DIAGNOSIS — Z171 Estrogen receptor negative status [ER-]: Secondary | ICD-10-CM | POA: Diagnosis not present

## 2015-01-26 DIAGNOSIS — Z87892 Personal history of anaphylaxis: Secondary | ICD-10-CM | POA: Diagnosis not present

## 2015-01-26 LAB — GLUCOSE, CAPILLARY
GLUCOSE-CAPILLARY: 112 mg/dL — AB (ref 65–99)
Glucose-Capillary: 100 mg/dL — ABNORMAL HIGH (ref 65–99)

## 2015-01-27 ENCOUNTER — Ambulatory Visit
Admission: RE | Admit: 2015-01-27 | Discharge: 2015-01-27 | Disposition: A | Discharge status: Home / Self Care | Payer: Medicare Other | Source: Ambulatory Visit | Attending: Radiation Oncology | Admitting: Radiation Oncology

## 2015-01-27 ENCOUNTER — Inpatient Hospital Stay: Admission: RE | Admit: 2015-01-27 | Payer: Self-pay | Source: Ambulatory Visit | Admitting: Radiation Oncology

## 2015-01-27 ENCOUNTER — Ambulatory Visit: Admission: RE | Admit: 2015-01-27 | Payer: Medicare Other | Source: Ambulatory Visit | Admitting: Radiation Oncology

## 2015-01-27 VITALS — BP 132/49 | HR 59 | Temp 97.7°F | Wt 159.1 lb

## 2015-01-27 DIAGNOSIS — E1140 Type 2 diabetes mellitus with diabetic neuropathy, unspecified: Secondary | ICD-10-CM | POA: Diagnosis not present

## 2015-01-27 DIAGNOSIS — Z89419 Acquired absence of unspecified great toe: Secondary | ICD-10-CM | POA: Diagnosis not present

## 2015-01-27 DIAGNOSIS — Z9221 Personal history of antineoplastic chemotherapy: Secondary | ICD-10-CM | POA: Diagnosis not present

## 2015-01-27 DIAGNOSIS — I5043 Acute on chronic combined systolic (congestive) and diastolic (congestive) heart failure: Secondary | ICD-10-CM | POA: Diagnosis not present

## 2015-01-27 DIAGNOSIS — C50911 Malignant neoplasm of unspecified site of right female breast: Secondary | ICD-10-CM | POA: Diagnosis not present

## 2015-01-27 DIAGNOSIS — Z4889 Encounter for other specified surgical aftercare: Secondary | ICD-10-CM | POA: Diagnosis not present

## 2015-01-27 DIAGNOSIS — C50411 Malignant neoplasm of upper-outer quadrant of right female breast: Secondary | ICD-10-CM

## 2015-01-27 DIAGNOSIS — M86672 Other chronic osteomyelitis, left ankle and foot: Secondary | ICD-10-CM | POA: Diagnosis not present

## 2015-01-27 DIAGNOSIS — Z171 Estrogen receptor negative status [ER-]: Secondary | ICD-10-CM | POA: Diagnosis not present

## 2015-01-27 DIAGNOSIS — C50011 Malignant neoplasm of nipple and areola, right female breast: Secondary | ICD-10-CM | POA: Diagnosis not present

## 2015-01-27 DIAGNOSIS — E11621 Type 2 diabetes mellitus with foot ulcer: Secondary | ICD-10-CM | POA: Diagnosis not present

## 2015-01-27 DIAGNOSIS — L97522 Non-pressure chronic ulcer of other part of left foot with fat layer exposed: Secondary | ICD-10-CM | POA: Diagnosis not present

## 2015-01-27 DIAGNOSIS — Z87892 Personal history of anaphylaxis: Secondary | ICD-10-CM | POA: Diagnosis not present

## 2015-01-27 DIAGNOSIS — Z89412 Acquired absence of left great toe: Secondary | ICD-10-CM | POA: Diagnosis not present

## 2015-01-27 DIAGNOSIS — R269 Unspecified abnormalities of gait and mobility: Secondary | ICD-10-CM | POA: Diagnosis not present

## 2015-01-27 DIAGNOSIS — I129 Hypertensive chronic kidney disease with stage 1 through stage 4 chronic kidney disease, or unspecified chronic kidney disease: Secondary | ICD-10-CM | POA: Diagnosis not present

## 2015-01-27 DIAGNOSIS — E1122 Type 2 diabetes mellitus with diabetic chronic kidney disease: Secondary | ICD-10-CM | POA: Diagnosis not present

## 2015-01-27 DIAGNOSIS — N189 Chronic kidney disease, unspecified: Secondary | ICD-10-CM | POA: Diagnosis not present

## 2015-01-27 DIAGNOSIS — Z510 Encounter for antineoplastic radiation therapy: Secondary | ICD-10-CM | POA: Diagnosis not present

## 2015-01-27 LAB — GLUCOSE, CAPILLARY
GLUCOSE-CAPILLARY: 127 mg/dL — AB (ref 65–99)
GLUCOSE-CAPILLARY: 90 mg/dL (ref 65–99)

## 2015-01-27 NOTE — Progress Notes (Signed)
Weekly assessment of right breast.Completed 25 of 25 treatments.Start boost of 8 tomorrow.Skin mildly discolored.Denies pain. Has some fatigue.Continue hyperbaric treatment to left foot. BP 132/49 mmHg  Pulse 59  Temp(Src) 97.7 F (36.5 C)  Wt 159 lb 1.6 oz (72.167 kg)

## 2015-01-27 NOTE — Progress Notes (Signed)
Weekly Management Note:  Site: Right breast Current Dose:   4500  cGy Projected Dose:  4500  CGy   Followed by right breast electron beam boost  Narrative: The patient is seen today for routine under treatment assessment. CBCT/MVCT images/port films were reviewed. The chart was reviewed.    She is without complaints today. She has completed her photon irradiation and she will begin her right breast boost tomorrow. She does admit to mild fatigue. She continues with hyperbaric oxygen for her left foot wound.  She uses Radioplex gel.  Physical Examination:  Filed Vitals:   01/27/15 0926  BP: 132/49  Pulse: 59  Temp: 97.7 F (36.5 C)  .  Weight: 159 lb 1.6 oz (72.167 kg).  There is mild to moderate hyperpigmentation of the skin along the right breast with focal dry desquamation along her axilla and inframammary region. No areas of moist desquamation.  Impression: Tolerating radiation therapy well.  Plan: Continue radiation therapy as planned.

## 2015-01-28 ENCOUNTER — Ambulatory Visit
Admission: RE | Admit: 2015-01-28 | Discharge: 2015-01-28 | Disposition: A | Discharge status: Home / Self Care | Payer: Medicare Other | Source: Ambulatory Visit | Attending: Radiation Oncology | Admitting: Radiation Oncology

## 2015-01-28 ENCOUNTER — Other Ambulatory Visit: Payer: Self-pay | Admitting: *Deleted

## 2015-01-28 DIAGNOSIS — Z89412 Acquired absence of left great toe: Secondary | ICD-10-CM | POA: Diagnosis not present

## 2015-01-28 DIAGNOSIS — Z87892 Personal history of anaphylaxis: Secondary | ICD-10-CM | POA: Diagnosis not present

## 2015-01-28 DIAGNOSIS — M86672 Other chronic osteomyelitis, left ankle and foot: Secondary | ICD-10-CM | POA: Diagnosis not present

## 2015-01-28 DIAGNOSIS — N189 Chronic kidney disease, unspecified: Secondary | ICD-10-CM | POA: Diagnosis not present

## 2015-01-28 DIAGNOSIS — E1122 Type 2 diabetes mellitus with diabetic chronic kidney disease: Secondary | ICD-10-CM | POA: Diagnosis not present

## 2015-01-28 DIAGNOSIS — Z171 Estrogen receptor negative status [ER-]: Secondary | ICD-10-CM | POA: Diagnosis not present

## 2015-01-28 DIAGNOSIS — Z9221 Personal history of antineoplastic chemotherapy: Secondary | ICD-10-CM | POA: Diagnosis not present

## 2015-01-28 DIAGNOSIS — E1140 Type 2 diabetes mellitus with diabetic neuropathy, unspecified: Secondary | ICD-10-CM | POA: Diagnosis not present

## 2015-01-28 DIAGNOSIS — C50411 Malignant neoplasm of upper-outer quadrant of right female breast: Secondary | ICD-10-CM | POA: Diagnosis not present

## 2015-01-28 DIAGNOSIS — C50911 Malignant neoplasm of unspecified site of right female breast: Secondary | ICD-10-CM | POA: Diagnosis not present

## 2015-01-28 DIAGNOSIS — Z510 Encounter for antineoplastic radiation therapy: Secondary | ICD-10-CM | POA: Diagnosis not present

## 2015-01-28 LAB — GLUCOSE, CAPILLARY
GLUCOSE-CAPILLARY: 116 mg/dL — AB (ref 65–99)
GLUCOSE-CAPILLARY: 88 mg/dL (ref 65–99)

## 2015-01-28 MED ORDER — METOPROLOL TARTRATE 25 MG PO TABS: 25.0000 mg | ORAL_TABLET | Freq: Two times a day (BID) | ORAL | Status: DC

## 2015-01-29 ENCOUNTER — Other Ambulatory Visit: Payer: Self-pay | Admitting: Hematology and Oncology

## 2015-01-29 ENCOUNTER — Ambulatory Visit
Admission: RE | Admit: 2015-01-29 | Discharge: 2015-01-29 | Disposition: A | Discharge status: Home / Self Care | Payer: Medicare Other | Source: Ambulatory Visit | Attending: Radiation Oncology | Admitting: Radiation Oncology

## 2015-01-29 DIAGNOSIS — Z171 Estrogen receptor negative status [ER-]: Secondary | ICD-10-CM | POA: Diagnosis not present

## 2015-01-29 DIAGNOSIS — E1140 Type 2 diabetes mellitus with diabetic neuropathy, unspecified: Secondary | ICD-10-CM | POA: Diagnosis not present

## 2015-01-29 DIAGNOSIS — M86672 Other chronic osteomyelitis, left ankle and foot: Secondary | ICD-10-CM | POA: Diagnosis not present

## 2015-01-29 DIAGNOSIS — C50911 Malignant neoplasm of unspecified site of right female breast: Secondary | ICD-10-CM | POA: Diagnosis not present

## 2015-01-29 DIAGNOSIS — N189 Chronic kidney disease, unspecified: Secondary | ICD-10-CM | POA: Diagnosis not present

## 2015-01-29 DIAGNOSIS — Z9221 Personal history of antineoplastic chemotherapy: Secondary | ICD-10-CM | POA: Diagnosis not present

## 2015-01-29 DIAGNOSIS — C50411 Malignant neoplasm of upper-outer quadrant of right female breast: Secondary | ICD-10-CM

## 2015-01-29 DIAGNOSIS — Z89412 Acquired absence of left great toe: Secondary | ICD-10-CM | POA: Diagnosis not present

## 2015-01-29 DIAGNOSIS — Z510 Encounter for antineoplastic radiation therapy: Secondary | ICD-10-CM | POA: Diagnosis not present

## 2015-01-29 DIAGNOSIS — E1122 Type 2 diabetes mellitus with diabetic chronic kidney disease: Secondary | ICD-10-CM | POA: Diagnosis not present

## 2015-01-29 DIAGNOSIS — Z87892 Personal history of anaphylaxis: Secondary | ICD-10-CM | POA: Diagnosis not present

## 2015-01-29 LAB — GLUCOSE, CAPILLARY
Glucose-Capillary: 98 mg/dL (ref 65–99)
Glucose-Capillary: 105 mg/dL — ABNORMAL HIGH (ref 65–99)
Glucose-Capillary: 109 mg/dL — ABNORMAL HIGH (ref 65–99)
Glucose-Capillary: 118 mg/dL — ABNORMAL HIGH (ref 65–99)

## 2015-02-01 ENCOUNTER — Ambulatory Visit
Admission: RE | Admit: 2015-02-01 | Discharge: 2015-02-01 | Disposition: A | Discharge status: Home / Self Care | Payer: Medicare Other | Source: Ambulatory Visit | Attending: Radiation Oncology | Admitting: Radiation Oncology

## 2015-02-01 DIAGNOSIS — Z510 Encounter for antineoplastic radiation therapy: Secondary | ICD-10-CM | POA: Diagnosis not present

## 2015-02-01 DIAGNOSIS — Z9221 Personal history of antineoplastic chemotherapy: Secondary | ICD-10-CM | POA: Diagnosis not present

## 2015-02-01 DIAGNOSIS — N189 Chronic kidney disease, unspecified: Secondary | ICD-10-CM | POA: Diagnosis not present

## 2015-02-01 DIAGNOSIS — Z87892 Personal history of anaphylaxis: Secondary | ICD-10-CM | POA: Diagnosis not present

## 2015-02-01 DIAGNOSIS — E1140 Type 2 diabetes mellitus with diabetic neuropathy, unspecified: Secondary | ICD-10-CM | POA: Diagnosis not present

## 2015-02-01 DIAGNOSIS — Z89412 Acquired absence of left great toe: Secondary | ICD-10-CM | POA: Diagnosis not present

## 2015-02-01 DIAGNOSIS — M86672 Other chronic osteomyelitis, left ankle and foot: Secondary | ICD-10-CM | POA: Diagnosis not present

## 2015-02-01 DIAGNOSIS — C50911 Malignant neoplasm of unspecified site of right female breast: Secondary | ICD-10-CM | POA: Diagnosis not present

## 2015-02-01 DIAGNOSIS — E1122 Type 2 diabetes mellitus with diabetic chronic kidney disease: Secondary | ICD-10-CM | POA: Diagnosis not present

## 2015-02-01 DIAGNOSIS — Z171 Estrogen receptor negative status [ER-]: Secondary | ICD-10-CM | POA: Diagnosis not present

## 2015-02-01 LAB — GLUCOSE, CAPILLARY
GLUCOSE-CAPILLARY: 118 mg/dL — AB (ref 65–99)
GLUCOSE-CAPILLARY: 114 mg/dL — AB (ref 65–99)

## 2015-02-01 MED ORDER — LORAZEPAM 0.5 MG PO TABS: 0.5000 mg | ORAL_TABLET | Freq: Three times a day (TID) | ORAL | Status: DC | PRN

## 2015-02-01 NOTE — Addendum Note (Signed)
Addended by: Jonelle Sports K on: 02/01/2015 11:59 AM   Modules accepted: Orders

## 2015-02-02 ENCOUNTER — Ambulatory Visit
Admission: RE | Admit: 2015-02-02 | Discharge: 2015-02-02 | Disposition: A | Discharge status: Home / Self Care | Payer: Medicare Other | Source: Ambulatory Visit | Attending: Radiation Oncology | Admitting: Radiation Oncology

## 2015-02-02 ENCOUNTER — Encounter: Payer: Self-pay | Admitting: Radiation Oncology

## 2015-02-02 VITALS — BP 181/62 | HR 60 | Temp 97.6°F | Resp 20 | Wt 161.7 lb

## 2015-02-02 DIAGNOSIS — Z89412 Acquired absence of left great toe: Secondary | ICD-10-CM | POA: Diagnosis not present

## 2015-02-02 DIAGNOSIS — C50411 Malignant neoplasm of upper-outer quadrant of right female breast: Secondary | ICD-10-CM

## 2015-02-02 DIAGNOSIS — M86672 Other chronic osteomyelitis, left ankle and foot: Secondary | ICD-10-CM | POA: Diagnosis not present

## 2015-02-02 DIAGNOSIS — Z171 Estrogen receptor negative status [ER-]: Secondary | ICD-10-CM | POA: Diagnosis not present

## 2015-02-02 DIAGNOSIS — Z9221 Personal history of antineoplastic chemotherapy: Secondary | ICD-10-CM | POA: Diagnosis not present

## 2015-02-02 DIAGNOSIS — E1122 Type 2 diabetes mellitus with diabetic chronic kidney disease: Secondary | ICD-10-CM | POA: Diagnosis not present

## 2015-02-02 DIAGNOSIS — N189 Chronic kidney disease, unspecified: Secondary | ICD-10-CM | POA: Diagnosis not present

## 2015-02-02 DIAGNOSIS — Z510 Encounter for antineoplastic radiation therapy: Secondary | ICD-10-CM | POA: Diagnosis not present

## 2015-02-02 DIAGNOSIS — E1140 Type 2 diabetes mellitus with diabetic neuropathy, unspecified: Secondary | ICD-10-CM | POA: Diagnosis not present

## 2015-02-02 DIAGNOSIS — C50911 Malignant neoplasm of unspecified site of right female breast: Secondary | ICD-10-CM | POA: Diagnosis not present

## 2015-02-02 DIAGNOSIS — Z87892 Personal history of anaphylaxis: Secondary | ICD-10-CM | POA: Diagnosis not present

## 2015-02-02 LAB — GLUCOSE, CAPILLARY
GLUCOSE-CAPILLARY: 88 mg/dL (ref 65–99)
Glucose-Capillary: 121 mg/dL — ABNORMAL HIGH (ref 65–99)

## 2015-02-02 NOTE — Progress Notes (Signed)
Weekly rad txs right breast 29/33 completed, mild erythema   Under inframmary  Fold slight peeling, using radiaplex bid, occasional burning   Stated, appetite poor drinks boost 1 every day  Going to hyberbaric chamber afterthis visit, energy level fair,  8:54 AM BP 181/62 mmHg  Pulse 60  Temp(Src) 97.6 F (36.4 C) (Oral)  Resp 20  Wt 161 lb 11.2 oz (73.347 kg)  Wt Readings from Last 3 Encounters:  02/02/15 161 lb 11.2 oz (73.347 kg)  01/27/15 159 lb 1.6 oz (72.167 kg)  01/19/15 158 lb 11.2 oz (71.986 kg)

## 2015-02-02 NOTE — Progress Notes (Signed)
Weekly Management Note Current Dose: 53  Gy  Projected Dose: 61 Gy   Narrative:  The patient presents for routine under treatment assessment.  CBCT/MVCT images/Port film x-rays were reviewed.  The chart was checked. Doing well. No complaints.  Would like to get back to water aerobics. Home PT ended last week. Would like referral for outpatient PT. Some irritation under right breast. Still taking percocet for neuropathy.   Physical Findings: Weight: 161 lb 11.2 oz (73.347 kg). Patchy moist desquamation in inframammary fold.   Impression:  The patient is tolerating radiation.  Plan:  Continue treatment as planned. Continue radiaplex. Referral for PT. Neosporin to inframammary fold. No water aerobics until skin has healed.

## 2015-02-03 ENCOUNTER — Ambulatory Visit
Admission: RE | Admit: 2015-02-03 | Discharge: 2015-02-03 | Disposition: A | Discharge status: Home / Self Care | Payer: Medicare Other | Source: Ambulatory Visit | Attending: Radiation Oncology | Admitting: Radiation Oncology

## 2015-02-03 DIAGNOSIS — M86672 Other chronic osteomyelitis, left ankle and foot: Secondary | ICD-10-CM | POA: Diagnosis not present

## 2015-02-03 DIAGNOSIS — E11621 Type 2 diabetes mellitus with foot ulcer: Secondary | ICD-10-CM | POA: Diagnosis not present

## 2015-02-03 DIAGNOSIS — E1140 Type 2 diabetes mellitus with diabetic neuropathy, unspecified: Secondary | ICD-10-CM | POA: Diagnosis not present

## 2015-02-03 DIAGNOSIS — C50911 Malignant neoplasm of unspecified site of right female breast: Secondary | ICD-10-CM | POA: Diagnosis not present

## 2015-02-03 DIAGNOSIS — N189 Chronic kidney disease, unspecified: Secondary | ICD-10-CM | POA: Diagnosis not present

## 2015-02-03 DIAGNOSIS — Z9221 Personal history of antineoplastic chemotherapy: Secondary | ICD-10-CM | POA: Diagnosis not present

## 2015-02-03 DIAGNOSIS — Z171 Estrogen receptor negative status [ER-]: Secondary | ICD-10-CM | POA: Diagnosis not present

## 2015-02-03 DIAGNOSIS — Z87892 Personal history of anaphylaxis: Secondary | ICD-10-CM | POA: Diagnosis not present

## 2015-02-03 DIAGNOSIS — Z510 Encounter for antineoplastic radiation therapy: Secondary | ICD-10-CM | POA: Diagnosis not present

## 2015-02-03 DIAGNOSIS — L97522 Non-pressure chronic ulcer of other part of left foot with fat layer exposed: Secondary | ICD-10-CM | POA: Diagnosis not present

## 2015-02-03 DIAGNOSIS — Z89412 Acquired absence of left great toe: Secondary | ICD-10-CM | POA: Diagnosis not present

## 2015-02-03 DIAGNOSIS — E1122 Type 2 diabetes mellitus with diabetic chronic kidney disease: Secondary | ICD-10-CM | POA: Diagnosis not present

## 2015-02-03 LAB — GLUCOSE, CAPILLARY
GLUCOSE-CAPILLARY: 139 mg/dL — AB (ref 65–99)
GLUCOSE-CAPILLARY: 117 mg/dL — AB (ref 65–99)
Glucose-Capillary: 82 mg/dL (ref 65–99)

## 2015-02-04 ENCOUNTER — Ambulatory Visit
Admission: RE | Admit: 2015-02-04 | Discharge: 2015-02-04 | Disposition: A | Discharge status: Home / Self Care | Payer: Medicare Other | Source: Ambulatory Visit | Attending: Radiation Oncology | Admitting: Radiation Oncology

## 2015-02-04 DIAGNOSIS — Z510 Encounter for antineoplastic radiation therapy: Secondary | ICD-10-CM | POA: Diagnosis not present

## 2015-02-04 DIAGNOSIS — E1140 Type 2 diabetes mellitus with diabetic neuropathy, unspecified: Secondary | ICD-10-CM | POA: Diagnosis not present

## 2015-02-04 DIAGNOSIS — E1122 Type 2 diabetes mellitus with diabetic chronic kidney disease: Secondary | ICD-10-CM | POA: Diagnosis not present

## 2015-02-04 DIAGNOSIS — Z87892 Personal history of anaphylaxis: Secondary | ICD-10-CM | POA: Diagnosis not present

## 2015-02-04 DIAGNOSIS — Z171 Estrogen receptor negative status [ER-]: Secondary | ICD-10-CM | POA: Diagnosis not present

## 2015-02-04 DIAGNOSIS — C50911 Malignant neoplasm of unspecified site of right female breast: Secondary | ICD-10-CM | POA: Diagnosis not present

## 2015-02-04 DIAGNOSIS — Z9221 Personal history of antineoplastic chemotherapy: Secondary | ICD-10-CM | POA: Diagnosis not present

## 2015-02-04 DIAGNOSIS — N189 Chronic kidney disease, unspecified: Secondary | ICD-10-CM | POA: Diagnosis not present

## 2015-02-04 DIAGNOSIS — M86672 Other chronic osteomyelitis, left ankle and foot: Secondary | ICD-10-CM | POA: Diagnosis not present

## 2015-02-04 DIAGNOSIS — Z89412 Acquired absence of left great toe: Secondary | ICD-10-CM | POA: Diagnosis not present

## 2015-02-04 LAB — GLUCOSE, CAPILLARY
GLUCOSE-CAPILLARY: 108 mg/dL — AB (ref 65–99)
Glucose-Capillary: 108 mg/dL — ABNORMAL HIGH (ref 65–99)

## 2015-02-05 ENCOUNTER — Ambulatory Visit
Admission: RE | Admit: 2015-02-05 | Discharge: 2015-02-05 | Disposition: A | Discharge status: Home / Self Care | Payer: Medicare Other | Source: Ambulatory Visit | Attending: Radiation Oncology | Admitting: Radiation Oncology

## 2015-02-05 DIAGNOSIS — E1140 Type 2 diabetes mellitus with diabetic neuropathy, unspecified: Secondary | ICD-10-CM | POA: Diagnosis not present

## 2015-02-05 DIAGNOSIS — M86672 Other chronic osteomyelitis, left ankle and foot: Secondary | ICD-10-CM | POA: Diagnosis not present

## 2015-02-05 DIAGNOSIS — Z510 Encounter for antineoplastic radiation therapy: Secondary | ICD-10-CM | POA: Diagnosis not present

## 2015-02-05 DIAGNOSIS — Z89412 Acquired absence of left great toe: Secondary | ICD-10-CM | POA: Diagnosis not present

## 2015-02-05 DIAGNOSIS — E1122 Type 2 diabetes mellitus with diabetic chronic kidney disease: Secondary | ICD-10-CM | POA: Diagnosis not present

## 2015-02-05 DIAGNOSIS — Z171 Estrogen receptor negative status [ER-]: Secondary | ICD-10-CM | POA: Diagnosis not present

## 2015-02-05 DIAGNOSIS — N189 Chronic kidney disease, unspecified: Secondary | ICD-10-CM | POA: Diagnosis not present

## 2015-02-05 DIAGNOSIS — C50911 Malignant neoplasm of unspecified site of right female breast: Secondary | ICD-10-CM | POA: Diagnosis not present

## 2015-02-05 DIAGNOSIS — Z87892 Personal history of anaphylaxis: Secondary | ICD-10-CM | POA: Diagnosis not present

## 2015-02-05 DIAGNOSIS — Z9221 Personal history of antineoplastic chemotherapy: Secondary | ICD-10-CM | POA: Diagnosis not present

## 2015-02-05 LAB — GLUCOSE, CAPILLARY
Glucose-Capillary: 106 mg/dL — ABNORMAL HIGH (ref 65–99)
Glucose-Capillary: 129 mg/dL — ABNORMAL HIGH (ref 65–99)

## 2015-02-08 ENCOUNTER — Ambulatory Visit: Payer: Medicare Other | Attending: Radiation Oncology | Admitting: Physical Therapy

## 2015-02-08 ENCOUNTER — Encounter: Payer: Self-pay | Admitting: Physical Therapy

## 2015-02-08 ENCOUNTER — Ambulatory Visit
Admission: RE | Admit: 2015-02-08 | Discharge: 2015-02-08 | Disposition: A | Discharge status: Home / Self Care | Payer: Medicare Other | Source: Ambulatory Visit | Attending: Radiation Oncology | Admitting: Radiation Oncology

## 2015-02-08 DIAGNOSIS — Z9221 Personal history of antineoplastic chemotherapy: Secondary | ICD-10-CM | POA: Diagnosis not present

## 2015-02-08 DIAGNOSIS — Z171 Estrogen receptor negative status [ER-]: Secondary | ICD-10-CM | POA: Diagnosis not present

## 2015-02-08 DIAGNOSIS — Z510 Encounter for antineoplastic radiation therapy: Secondary | ICD-10-CM | POA: Diagnosis not present

## 2015-02-08 DIAGNOSIS — R29898 Other symptoms and signs involving the musculoskeletal system: Secondary | ICD-10-CM | POA: Insufficient documentation

## 2015-02-08 DIAGNOSIS — Z9181 History of falling: Secondary | ICD-10-CM | POA: Insufficient documentation

## 2015-02-08 DIAGNOSIS — C50911 Malignant neoplasm of unspecified site of right female breast: Secondary | ICD-10-CM | POA: Diagnosis not present

## 2015-02-08 DIAGNOSIS — M79672 Pain in left foot: Secondary | ICD-10-CM | POA: Insufficient documentation

## 2015-02-08 DIAGNOSIS — M79671 Pain in right foot: Secondary | ICD-10-CM | POA: Insufficient documentation

## 2015-02-08 NOTE — Therapy (Signed)
[place], [place], [address] Phone: [phone]   Fax:  [phone]  Physical Therapy Evaluation  Patient Details  Name: Cheyenne Riggs MRN: [id number] Date of Birth: 05/03/1946 Referring Provider:  Thea Silversmith, MD  Encounter Date: 02/08/2015      PT End of Session - 02/08/15 1511    Visit Number 1   Number of Visits 24   Date for PT Re-Evaluation 04/05/15   PT Start Time 0321   PT Stop Time 1512   PT Time Calculation (min) 40 min   Activity Tolerance Patient tolerated treatment well   Behavior During Therapy [facility] for tasks assessed/performed      Past Medical History  Diagnosis Date  . Back pain   . H/O bladder problems   . Cholelithiasis   . Depression   . High blood pressure   . Hypercholesteremia   . Gum disease   . Wears glasses   . Wears dentures     top  . Cancer   . Breast cancer   . History of kidney stones     leaks  . Complication of anesthesia     bp goes up- now on Lopressor  . Dysrhythmia 08/2014    Afib- episode- was cardioverted in ED [facility]  . CHF (congestive heart failure)   . Pneumonia 05/2014  . History of kidney stones     paseed one, still has 2  . Anxiety     Panic attacks - when she was on Chanix- still has anxiety  . History of blood transfusion   . Shingles   . Thrombocytopenia 4/16  . Rhabdomyolysis 08/2014  . SVT (supraventricular tachycardia) 4/16  . ARF (acute renal failure) 4/16    Past Surgical History  Procedure Laterality Date  . Colon biopsy    . Mouth surgery      implants - denture attatchs to it  . Spine surgery    . Colonoscopy    . Tubal ligation    . Lithotripsy    . Rhinoplasty    . Back surgery  1989    lumb lam  . Breast biopsy Right 02/05/2014    invasive ductal cncer  . Abdominal hysterectomy  1982  . Cholecystectomy  2011    lap choli  . Radioactive seed guided mastectomy with axillary sentinel lymph node biopsy Right  02/27/2014    Procedure: RADIOACTIVE SEED GUIDED RIGHT PARTIAL MASTECTOMY WITH AXILLARY SENTINEL LYMPH NODE BIOPSY;  Surgeon: Stark Klein, MD;  Location: [place];  Service: General;  Laterality: Right;  . Portacath placement N/A 02/27/2014    Procedure: INSERTION PORT-A-CATH;  Surgeon: Stark Klein, MD;  Location: [place];  Service: General;  Laterality: N/A;  . Peripheral vascular catheterization N/A 09/28/2014    Procedure: Abdominal Aortogram;  Surgeon: Angelia Mould, MD;  Location: [place] [facility];  Service: Cardiovascular;  Laterality: N/A;  . Amputation Left 12/01/2014    Procedure: AMPUTATION Left GREAT TOE;  Surgeon: Angelia Mould, MD;  Location: [place];  Service: Vascular;  Laterality: Left;    There were no vitals filed for this visit.  Visit Diagnosis:  At risk for falls - Plan: PT plan of care cert/re-cert  Leg weakness, bilateral - Plan: PT plan of care cert/re-cert  Pain in both feet - Plan: PT plan of care cert/re-cert      Subjective Assessment - 02/08/15 1432    Subjective Patient is here  for chemo induced peripheral neuropathy.  She is having difficulty walking and with her balance.  She uses a rolling walker at times.  She reports all of her toes would turn black at times during her hospitalizations.  She developed a left heel pressure ulcer and was referred to a wound doctor.  She underwent wound dressings and extensive physical therapy to improve her walking.  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   Pertinent History Patient underwent a right lumpectomy and sentinel node biopsy on 02/06/15 followed by chemotherapy for 10 weeks but stopped due to poor tolerance.  She was hospitalized several times, had dehydration, pneumonia, and C-diff twice.  She was sent to a skilled nursing facility twice after those hospitalizations.  She had radiation on her right breast which was completed today 02/08/15.  She reports all of her  toes would turn black at times during her hospitalizations.  She developed a left heel pressure ulcer and was referred to a wound doctor.  She underwent wound dressings and extensive physical therapy to improve her walking.  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  She completed hyperbaric wound treatments until last week.   Patient Stated Goals Improve balance and walk easier   Currently in Pain? 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 Lindi Adie   Prior Therapy skilled nursing rehab until  10/25/14 and home health PT until 02/01/15   Precautions   Precautions Fall;Other (comment)   Restrictions   Weight Bearing Restrictions No   Balance Screen   Has the patient fallen in the past 6 months Yes   How many times? 3   Has the patient had a decrease in activity level because of a fear of falling?  Yes   Is the patient reluctant to leave their home because of a fear of falling?  Yes   [place] residence   Living Arrangements Alone   Available Help at Discharge Family   Type of [place] to enter   Entrance Stairs-Number of Steps 4   Entrance Stairs-Rails Left   Home Layout Two level   Prior Function   Level of [place] device for independence   Vocation Retired   Leisure Currently  unable to exercise   Cognition   Overall Cognitive Status Within Functional Limits for tasks assessed   Coordination   Heel Shin Test Intact  Rapid alternating movements in feet are slightly impaired   Posture/Postural Control   Posture/Postural Control Postural limitations   Postural Limitations Rounded Shoulders;Forward head   ROM / Strength   AROM / PROM / Strength Strength   Strength   Strength Assessment Site Hip;Knee;Ankle   Right/Left  Hip Right;Left   Right Hip Flexion 4/5   Left Hip Flexion 4-/5   Right/Left Knee Right;Left   Right Knee Flexion 5/5   Right Knee Extension 5/5   Left Knee Flexion 5/5   Left Knee Extension 5/5   Right/Left Ankle Right;Left   Right Ankle Dorsiflexion 5/5   Right Ankle Plantar Flexion 5/5   Right Ankle Inversion 4+/5   Right Ankle Eversion 5/5   Left Ankle Dorsiflexion 5/5   Left Ankle Plantar Flexion 5/5   Left Ankle Inversion 4-/5   Left Ankle Eversion 5/5   Palpation   Palpation comment Right great toe appears to be amputated at DIP joint; decreased sensation bilateral plantar feet   Berg Balance Test   Sit to Stand Able to stand without using hands and stabilize independently   Standing Unsupported Able to stand safely 2 minutes   Sitting with Back Unsupported but Feet Supported on Floor or Stool Able to sit safely and securely 2 minutes   Stand to Sit Sits safely with minimal use of hands   Transfers Able to transfer safely, minor use of hands   Standing Unsupported with Eyes Closed Able to stand 10 seconds safely   Standing Ubsupported with Feet Together Able to place feet together independently and stand 1 minute safely   From Standing, Reach Forward with Outstretched Arm Can reach forward >12 cm safely (5")   From Standing Position, Pick up Object from Floor Able to pick up shoe, needs supervision   From Standing Position, Turn to Look Behind Over each Shoulder Looks behind one side only/other side shows less weight shift   Turn  360 Degrees Able to turn 360 degrees safely in 4 seconds or less   Standing Unsupported, Alternately Place Feet on Step/Stool Able to stand independently and safely and complete 8 steps in 20 seconds   Standing Unsupported, One Foot in Front Able to take small step independently and hold 30 seconds   Standing on One Leg Able to lift leg independently and hold equal to or more than 3 seconds   Total Score 49   Timed Up and Go Test   Normal TUG (seconds) 15           LYMPHEDEMA/ONCOLOGY QUESTIONNAIRE - 02/08/15 1723    Type   Cancer Type Right breast   Surgeries   Lumpectomy Date 02/06/15   Sentinel Lymph Node Biopsy Date 02/06/15   Treatment   Active Chemotherapy Treatment No   Past Chemotherapy Treatment Yes   Date --  Earlier this year but stopped due to complications   Active Radiation Treatment No                           Short Term Clinic Goals - 02/08/15 1725    CC Short Term Goal  #1   Title Patient will be able to perform initial home exercise program independently and safely   Time 4   Period Weeks   Status New   CC Short Term Goal  #2   Title Patient will be able to report >/= 20% reduction in pain in her feet to tolerate walking with less difficulty   Time 4   Period Weeks   Status New   CC Short Term Goal  #3   Title Patient will be able to increase BERG balance test score to >/= 50/56 to reduce fall risk   Time 4   Period Weeks  Status New   CC Short Term Goal  #4   Title Patient will be able to decrease the Timed Up and Go score time to be less than 13 seconds for more functional and safe ambulation.   Time 4   Period Weeks   Status New             Long Term Clinic Goals - 02/20/2015 1727    CC Long Term Goal  #1   Title Patient will be able to report >/= 40% reduction in pain in her feet to tolerate walking with less difficulty   Time 8   Period Weeks   Status New   CC Long Term Goal  #2   Title Patient will be able to  increase BERG balance test score to >/= 52/56 to reduce fall risk   Time 8   Period Weeks   Status New   CC Long Term Goal  #3   Title Patient will be able to decrease the Timed Up and Go score time to be less than 11 seconds for more functional and safe ambulation.   Time 8   Period Weeks   Status New   CC Long Term Goal  #4   Title Patient will be able to verbalize and demonstrate understanding of use of the home TENS unit for pain management if approved by her physician and insurance company.   Time 8   Period Weeks   Status New            Plan - 02-20-15 1512    Clinical Impression Statement Patient is a very pleasant 69 y.o. woman s/p right lumpectomy with sentinel node biopsy followed by chemotherapy which resulted in severe chemo-induced peripheral neuropathy.  She had several hospitalizations and a left great toe amputation and recently finished home health PT.  She completed right breast radiation today.  She will benefit from PT to reduce fal risk, improve proprioception, and reduce pain.   Pt will benefit from skilled therapeutic intervention in order to improve on the following deficits Decreased strength;Impaired sensation;Decreased balance;Pain;Decreased endurance;Abnormal gait   Rehab Potential Good   Clinical Impairments Affecting Rehab Potential CIPN   PT Frequency 3x / week   PT Duration 8 weeks   PT Treatment/Interventions ADLs/Self Care Home Management;Neuromuscular re-education;Passive range of motion;Patient/family education;Gait training;Electrical Stimulation;Therapeutic exercise;Moist Heat;Manual techniques   PT Next Visit Plan Moist heat (use extra pads), soft tissue work to plantar feet; TENS trial; neuro re-ed to reduce fall risk   Recommended Other Services TENS unit for home pain management   Consulted and Agree with Plan of Care Patient          G-Codes - Feb 20, 2015 1728    Functional Assessment Tool Used Clinical Judgement   Functional Limitation  Mobility: Walking and moving around   Mobility: Walking and Moving Around Current Status [phone]) At least 40 percent but less than 60 percent impaired, limited or restricted   Mobility: Walking and Moving Around Goal Status [phone]) At least 1 percent but less than 20 percent impaired, limited or restricted       Problem List Patient Active Problem List   Diagnosis Date Noted  . Pain of toe of left foot-Great Toe 12/23/2014  . Atrial fibrillation 12/07/2014  . Shingles outbreak 10/14/2014  . Cellulitis of great toe 10/14/2014  . Metabolic acidosis 60/63/0160  . Acute on chronic respiratory failure 09/07/2014  . Acute on chronic combined systolic and diastolic heart failure 10/93/2355  . Ischemic  ulcer of toes on both feet   . Renal failure (ARF), acute on chronic   . Thrombocytopenia   . Palliative care encounter   . Neuropathic pain of both feet   . Elevated liver function tests   . Weakness   . Renal failure 08/26/2014  . Transaminitis 08/26/2014  . Acute renal failure syndrome   . Clostridium difficile colitis 08/24/2014  . Nausea without vomiting 08/24/2014  . Dehydration 08/24/2014  . Hypokalemia 08/24/2014  . Immobility 08/24/2014  . CKD (chronic kidney disease), stage III 08/18/2014  . Acute systolic heart failure   . Paroxysmal SVT (supraventricular tachycardia) 08/17/2014  . UTI (lower urinary tract infection) 08/17/2014  . Protein-calorie malnutrition, severe 08/17/2014  . Elevated troponin 08/09/2014  . Antineoplastic chemotherapy induced anemia 06/29/2014  . Enteritis due to Clostridium difficile 05/24/2014  . Breast cancer of upper-outer quadrant of right female breast 02/18/2014  . Essential hypertension, benign 02/18/2014   Annia Friendly, PT 02/08/2015 5:30 PM  [place], [place], [address] Phone: [phone]   Fax:  [phone]

## 2015-02-08 NOTE — Progress Notes (Signed)
Patient completed 33 of 33 treatments to right breast.skin mildly discolored without any peeling.removed sticker.Skin care reinforced.Continue radiaplex for 2 weeks and then apply lotion with vitamin e.Denies pain,knows to call if any questions or concerns.

## 2015-02-09 ENCOUNTER — Other Ambulatory Visit: Payer: Self-pay | Admitting: Adult Health

## 2015-02-09 DIAGNOSIS — C50411 Malignant neoplasm of upper-outer quadrant of right female breast: Secondary | ICD-10-CM

## 2015-02-09 NOTE — Assessment & Plan Note (Signed)
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 status post adjuvant chemotherapy with dose dense Adriamycin and Cytoxan 4 starting 03/30/2014 followed by Abraxane weekly 7 discontinued 07/27/2014 due to severe toxicities and hospitalizations Chemotherapy toxicities include C. difficile, neutropenic fever, dental infection, SIRS, nausea, diarrhea, fatigue, anemia, hypokalemia, CHF) S/P XRT  Recommendation: since she is ER/PR negative, there is no role of antiestrogen therapy. Congestive heart failure Atrial fibrillation Lower extremity embolism causing gangrene: On anticoagulation, continue with this indefinitely. Multiple heart and lung issues:   Return to clinic in 6 months for follow-up

## 2015-02-10 ENCOUNTER — Telehealth: Payer: Self-pay | Admitting: Hematology and Oncology

## 2015-02-10 ENCOUNTER — Encounter: Payer: Self-pay | Admitting: Hematology and Oncology

## 2015-02-10 ENCOUNTER — Ambulatory Visit (HOSPITAL_BASED_OUTPATIENT_CLINIC_OR_DEPARTMENT_OTHER): Payer: Medicare Other | Admitting: Hematology and Oncology

## 2015-02-10 VITALS — BP 173/76 | HR 59 | Temp 97.2°F | Resp 18 | Ht 63.0 in | Wt 158.0 lb

## 2015-02-10 DIAGNOSIS — L97529 Non-pressure chronic ulcer of other part of left foot with unspecified severity: Secondary | ICD-10-CM | POA: Diagnosis not present

## 2015-02-10 DIAGNOSIS — L97909 Non-pressure chronic ulcer of unspecified part of unspecified lower leg with unspecified severity: Secondary | ICD-10-CM | POA: Diagnosis not present

## 2015-02-10 DIAGNOSIS — C50411 Malignant neoplasm of upper-outer quadrant of right female breast: Secondary | ICD-10-CM | POA: Diagnosis not present

## 2015-02-10 DIAGNOSIS — E11621 Type 2 diabetes mellitus with foot ulcer: Secondary | ICD-10-CM | POA: Diagnosis not present

## 2015-02-10 DIAGNOSIS — Z89412 Acquired absence of left great toe: Secondary | ICD-10-CM | POA: Diagnosis not present

## 2015-02-10 DIAGNOSIS — I70299 Other atherosclerosis of native arteries of extremities, unspecified extremity: Secondary | ICD-10-CM

## 2015-02-10 DIAGNOSIS — L97519 Non-pressure chronic ulcer of other part of right foot with unspecified severity: Secondary | ICD-10-CM

## 2015-02-10 DIAGNOSIS — M86672 Other chronic osteomyelitis, left ankle and foot: Secondary | ICD-10-CM | POA: Diagnosis not present

## 2015-02-10 DIAGNOSIS — E1122 Type 2 diabetes mellitus with diabetic chronic kidney disease: Secondary | ICD-10-CM | POA: Diagnosis not present

## 2015-02-10 DIAGNOSIS — N189 Chronic kidney disease, unspecified: Secondary | ICD-10-CM | POA: Diagnosis not present

## 2015-02-10 DIAGNOSIS — Z87892 Personal history of anaphylaxis: Secondary | ICD-10-CM | POA: Diagnosis not present

## 2015-02-10 DIAGNOSIS — L97522 Non-pressure chronic ulcer of other part of left foot with fat layer exposed: Secondary | ICD-10-CM | POA: Diagnosis not present

## 2015-02-10 DIAGNOSIS — E1140 Type 2 diabetes mellitus with diabetic neuropathy, unspecified: Secondary | ICD-10-CM | POA: Diagnosis not present

## 2015-02-10 DIAGNOSIS — L89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, stage 2: Secondary | ICD-10-CM | POA: Diagnosis not present

## 2015-02-10 NOTE — Telephone Encounter (Signed)
Gave avs & calendar for March 2017. [facility] mammo schedule for 07/28/15 @ 9:00.

## 2015-02-10 NOTE — Progress Notes (Signed)
Patient Care Team: Hulan Fess, MD as PCP - General (Family Medicine)  DIAGNOSIS: Breast cancer of upper-outer quadrant of right female breast   Staging form: Breast, AJCC 7th Edition     Clinical: Stage IA (T1c, N0, cM0) - Signed by Thea Silversmith, MD on 02/18/2014     Pathologic: Stage IIA (T2, N0, cM0) - Signed by Rulon Eisenmenger, MD on 03/12/2014   SUMMARY OF ONCOLOGIC HISTORY:   Breast cancer of upper-outer quadrant of right female breast   02/27/2014 Surgery Right breast lumpectomy: Invasive ductal carcinoma grade 3, 2.5 cm, DCIS intermediate grade, margins negative, 2 SLN negative, ER/PR HER-2 negative   03/30/2014 - 07/27/2014 Chemotherapy Adjuvant chemotherapy with dose dense Adriamycin Cytoxan x4 followed by Abraxane weekly x7/12 (stopped early due to hospitalizations and recurrent C. difficile)   05/17/2014 - 05/26/2014 Hospital Admission Neutropenic fever secondary to dental infection which led to C. difficile enterocolitis, atypical pneumonia and SIRS   08/09/2014 - 08/10/2014 Hospital Admission Hospitalization for nausea vomiting and dehydration related to diarrhea from C. 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 difficile infections. She also developed gangrene and was placed on liquids. She had a toe amputation. She completed radiation therapy recently and is here today to discuss a follow-up plan. She did not have any trouble with radiation therapy.  REVIEW OF SYSTEMS:   Constitutional: Denies fevers, chills or abnormal weight loss Eyes: Denies blurriness of vision Ears,  nose, mouth, throat, and face: Denies mucositis or sore throat Respiratory: Denies cough, dyspnea or wheezes Cardiovascular: Denies palpitation, chest discomfort or lower extremity swelling Gastrointestinal:  Denies nausea, heartburn or change in bowel habits Skin: Denies abnormal skin rashes Lymphatics: Denies new lymphadenopathy or easy bruising Neurological: Peripheral neuropathy that causes significant pain and discomfort. Behavioral/Psych: Mood is stable, no new changes  Breast:  denies any pain or lumps or nodules in either breasts All other systems were reviewed with the patient and are negative.  I have reviewed the past medical history, past surgical history, social history and family history with the patient and they are unchanged from previous note.  ALLERGIES:  is allergic to ambien; ciprofloxacin; demerol; lidocaine; other; septra; codeine; novocain; and vancomycin.  MEDICATIONS:  Current Outpatient Prescriptions  Medication Sig Dispense Refill  . acetaminophen (TYLENOL) 325 MG tablet Take 650 mg by mouth at bedtime as needed for moderate pain.    Marland Kitchen apixaban (ELIQUIS) 5 MG TABS tablet Take 1 tablet (5 mg total) by mouth 2 (two) times daily. 60 tablet 11  . diphenhydrAMINE (BENADRYL) 25 MG tablet Take 25 mg by mouth daily as needed for allergies (allergies).     Marland Kitchen FLUoxetine (PROZAC) 10 MG capsule Take 1 capsule (10 mg total) by mouth daily. 30 capsule 1  . furosemide (LASIX) 40 MG tablet Take 1 tablet (40 mg total) by mouth daily. (Patient taking differently: Take 20 mg by mouth daily as needed (weight gain). ) 30 tablet 0  . gabapentin (NEURONTIN) 100 MG capsule TAKE (1) TABLET BY MOUTH TWICE A DAY 60 capsule 2  . LORazepam (ATIVAN) 0.5 MG tablet Take 1 tablet (0.5 mg total) by mouth every 8 (eight)  hours as needed for anxiety. 30 tablet 0  . metoprolol tartrate (LOPRESSOR) 25 MG tablet Take 1 tablet (25 mg total) by mouth 2 (two) times daily. 60 tablet 5  .  oxyCODONE-acetaminophen (PERCOCET/ROXICET) 5-325 MG per tablet Take 1 tablet by mouth at bedtime as needed. (Patient taking differently: Take 1 tablet by mouth at bedtime as needed for moderate pain. ) 30 tablet 0  . potassium chloride (MICRO-K) 10 MEQ CR capsule Take 1 capsule (10 mEq total) by mouth 2 (two) times daily. 180 capsule 3  . Probiotic Product (PROBIOTIC DAILY PO) Take 1 tablet by mouth daily.     . promethazine (PHENERGAN) 12.5 MG suppository Place 12.5 mg rectally every 6 (six) hours as needed for nausea or vomiting.     No current facility-administered medications for this visit.    PHYSICAL EXAMINATION: ECOG PERFORMANCE STATUS: 1 - Symptomatic but completely ambulatory  Filed Vitals:   02/10/15 1028  BP: 173/76  Pulse: 59  Temp: 97.2 F (36.2 C)  Resp: 18   Filed Weights   02/10/15 1028  Weight: 158 lb (71.668 kg)    GENERAL:alert, no distress and comfortable SKIN: skin color, texture, turgor are normal, no rashes or significant lesions EYES: normal, Conjunctiva are pink and non-injected, sclera clear OROPHARYNX:no exudate, no erythema and lips, buccal mucosa, and tongue normal  NECK: supple, thyroid normal size, non-tender, without nodularity LYMPH:  no palpable lymphadenopathy in the cervical, axillary or inguinal LUNGS: clear to auscultation and percussion with normal breathing effort HEART: regular rate & rhythm and no murmurs and no lower extremity edema ABDOMEN:abdomen soft, non-tender and normal bowel sounds Musculoskeletal:no cyanosis of digits and no clubbing  NEURO: Peripheral neuropathy that causes pain.  LABORATORY DATA:  I have reviewed the data as listed   Chemistry      Component Value Date/Time   NA 144 01/12/2015 1053   NA 141 11/10/2014 1321   K 4.1 01/12/2015 1053   K 4.0 11/10/2014 1321   CL 108 01/12/2015 1053   CO2 28 01/12/2015 1053   CO2 29 11/10/2014 1321   BUN 19 01/12/2015 1053   BUN 13.8 11/10/2014 1321   CREATININE 1.03*  01/12/2015 1053   CREATININE 1.1 11/10/2014 1321      Component Value Date/Time   CALCIUM 9.7 01/12/2015 1053   CALCIUM 9.5 11/10/2014 1321   ALKPHOS 81 12/01/2014 1040   ALKPHOS 88 11/10/2014 1321   AST 17 12/01/2014 1040   AST 23 11/10/2014 1321   ALT 11* 12/01/2014 1040   ALT 10 11/10/2014 1321   BILITOT 1.2 12/01/2014 1040   BILITOT 0.97 11/10/2014 1321       Lab Results  Component Value Date   WBC 4.4 01/12/2015   HGB 12.6 01/12/2015   HCT 39.7 01/12/2015   MCV 89.0 01/12/2015   PLT 175 01/12/2015   NEUTROABS 3.2 11/10/2014   ASSESSMENT & PLAN:  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 status post adjuvant chemotherapy with dose dense Adriamycin and Cytoxan 4 starting 03/30/2014 followed by Abraxane weekly 7 discontinued 07/27/2014 due to severe toxicities and hospitalizations Chemotherapy toxicities include C. difficile, neutropenic fever, dental infection, SIRS, nausea, diarrhea, fatigue, anemia, hypokalemia, CHF) S/P XRT completed 02/08/2015  Recommendation: since she is ER/PR negative, there is no role of antiestrogen therapy. Congestive heart failure Atrial fibrillation Lower extremity  embolism causing gangrene: On anticoagulation, continue with this indefinitely. Multiple heart and lung issues:  We'll send a referral to Dr. Barry Dienes to remove her port  Peripheral neuropathy related to prior chemotherapy as well as ischemia: Currently on Percocets as needed and Ativan as needed. She takes occasional Percocet.  Return to clinic in 6 months for follow-up after mammograms.  Orders Placed This Encounter  Procedures  . MM Digital Diagnostic Bilat    Standing Status: Future     Number of Occurrences:      Standing Expiration Date: 02/10/2016    Order Specific Question:  Reason for Exam (SYMPTOM  OR  DIAGNOSIS REQUIRED)    Answer:  Breast cancer annual eval    Order Specific Question:  Preferred imaging location?    Answer:  External     Comments:  [facility]   The patient has a good understanding of the overall plan. she agrees with it. she will call with any problems that may develop before the next visit here.   Rulon Eisenmenger, MD

## 2015-02-11 ENCOUNTER — Ambulatory Visit: Payer: Medicare Other | Admitting: Physical Therapy

## 2015-02-11 ENCOUNTER — Encounter: Payer: Self-pay | Admitting: Radiation Oncology

## 2015-02-11 NOTE — Progress Notes (Signed)
Radiation Oncology         [phone] ________________________________  Name: Cheyenne Riggs      MRN: [id number]          Date:   12/15/14 OB: [birth year]  Optical Surface Tracking Plan:  Since intensity modulated radiotherapy (IMRT) and 3D conformal radiation treatment methods are predicated on accurate and precise positioning for treatment, intrafraction motion monitoring is medically necessary to ensure accurate and safe treatment delivery.  The ability to quantify intrafraction motion without excessive ionizing radiation dose can only be performed with optical surface tracking. Accordingly, surface imaging offers the opportunity to obtain 3D measurements of patient position throughout IMRT and 3D treatments without excessive radiation exposure.  I am ordering optical surface tracking for this patient's upcoming course of radiotherapy. ________________________________ Signature   Reference:   Ursula Alert, J, et al. Surface imaging-based analysis of intrafraction motion for breast radiotherapy patients.Journal of [place], n. 6, nov. 2014. ISSN [id number].   Available at: <http://www.jacmp.org/index.php/jacmp/article/view/4957>.

## 2015-02-11 NOTE — Progress Notes (Signed)
  Radiation Oncology         [phone] ________________________________  Name: Cheyenne Riggs MRN: [id number]  Date: 02/11/2015  DOB: 06-Jun-1945  End of Treatment Note  Diagnosis:   Breast cancer of upper-outer quadrant of right female breast   Staging form: Breast, AJCC 7th Edition     Clinical: Stage IA (T1c, N0, cM0) - Signed by Thea Silversmith, MD on 02/18/2014     Pathologic: Stage IIA (T2, N0, cM0) - Signed by Rulon Eisenmenger, MD on 03/12/2014     Indication for treatment: Curative    Radiation treatment dates: 12/23/2014-02/08/2015  Site/dose:    Right breast / 45 Gray @ 1.8 Pearline Cables per fraction x 25 fractions Right breast boost / 16 Gray at [employer] per fraction x 8 fractions  Beams/energy:  Opposed Tangents / 6 MV photons En face / 15 MeV photons   Narrative: The patient tolerated radiation treatment relatively well and had some patchy moist desquamation in the inframammary fold. This was treated with Neosporin. She was treated with concurrent hypobaric oxygen for aid in healing her amputation. She will be referred to physical therapy following treatment for assistance in ambulation.  Plan: The patient has completed radiation treatment. The patient will return to radiation oncology clinic for routine followup in one month. I advised them to call or return sooner if they have any questions or concerns related to their recovery or treatment.  This document serves as a record of services personally performed by Thea Silversmith , MD. It was created on her behalf by Lenn Cal, a trained medical scribe. The creation of this record is based on the scribe's personal observations and the provider's statements to them. This document has been checked and approved by the attending provider.   ------------------------------------------------  Thea Silversmith, MD

## 2015-02-12 DIAGNOSIS — R269 Unspecified abnormalities of gait and mobility: Secondary | ICD-10-CM | POA: Diagnosis not present

## 2015-02-12 DIAGNOSIS — Z89419 Acquired absence of unspecified great toe: Secondary | ICD-10-CM | POA: Diagnosis not present

## 2015-02-12 DIAGNOSIS — I5043 Acute on chronic combined systolic (congestive) and diastolic (congestive) heart failure: Secondary | ICD-10-CM | POA: Diagnosis not present

## 2015-02-12 DIAGNOSIS — I129 Hypertensive chronic kidney disease with stage 1 through stage 4 chronic kidney disease, or unspecified chronic kidney disease: Secondary | ICD-10-CM | POA: Diagnosis not present

## 2015-02-12 DIAGNOSIS — C50011 Malignant neoplasm of nipple and areola, right female breast: Secondary | ICD-10-CM | POA: Diagnosis not present

## 2015-02-12 DIAGNOSIS — Z4889 Encounter for other specified surgical aftercare: Secondary | ICD-10-CM | POA: Diagnosis not present

## 2015-02-15 ENCOUNTER — Ambulatory Visit: Payer: Medicare Other

## 2015-02-17 ENCOUNTER — Ambulatory Visit: Payer: Medicare Other | Attending: Radiation Oncology | Admitting: Physical Therapy

## 2015-02-17 DIAGNOSIS — M79671 Pain in right foot: Secondary | ICD-10-CM | POA: Diagnosis not present

## 2015-02-17 DIAGNOSIS — M79672 Pain in left foot: Secondary | ICD-10-CM | POA: Insufficient documentation

## 2015-02-17 DIAGNOSIS — Z9181 History of falling: Secondary | ICD-10-CM | POA: Insufficient documentation

## 2015-02-17 DIAGNOSIS — R29898 Other symptoms and signs involving the musculoskeletal system: Secondary | ICD-10-CM | POA: Diagnosis not present

## 2015-02-17 NOTE — Therapy (Signed)
[place], [place], [address] Phone: [phone]   Fax:  [phone]  Physical Therapy Treatment  Patient Details  Name: Cheyenne Riggs MRN: [id number] Date of Birth: Sep 03, 1945 Referring Provider:  Thea Silversmith, MD  Encounter Date: 02/17/2015      PT End of Session - 02/17/15 1219    Visit Number 2   Number of Visits 24   Date for PT Re-Evaluation 04/05/15   PT Start Time 1102   PT Stop Time 1147   PT Time Calculation (min) 45 min      Past Medical History  Diagnosis Date  . Back pain   . H/O bladder problems   . Cholelithiasis   . Depression   . High blood pressure   . Hypercholesteremia   . Gum disease   . Wears glasses   . Wears dentures     top  . Cancer   . Breast cancer   . History of kidney stones     leaks  . Complication of anesthesia     bp goes up- now on Lopressor  . Dysrhythmia 08/2014    Afib- episode- was cardioverted in ED [facility]  . CHF (congestive heart failure)   . Pneumonia 05/2014  . History of kidney stones     paseed one, still has 2  . Anxiety     Panic attacks - when she was on Chanix- still has anxiety  . History of blood transfusion   . Shingles   . Thrombocytopenia 4/16  . Rhabdomyolysis 08/2014  . SVT (supraventricular tachycardia) 4/16  . ARF (acute renal failure) 4/16    Past Surgical History  Procedure Laterality Date  . Colon biopsy    . Mouth surgery      implants - denture attatchs to it  . Spine surgery    . Colonoscopy    . Tubal ligation    . Lithotripsy    . Rhinoplasty    . Back surgery  1989    lumb lam  . Breast biopsy Right 02/05/2014    invasive ductal cncer  . Abdominal hysterectomy  1982  . Cholecystectomy  2011    lap choli  . Radioactive seed guided mastectomy with axillary sentinel lymph node biopsy Right 02/27/2014    Procedure: RADIOACTIVE SEED GUIDED RIGHT PARTIAL MASTECTOMY WITH AXILLARY SENTINEL LYMPH NODE BIOPSY;   Surgeon: Stark Klein, MD;  Location: [place] [facility];  Service: General;  Laterality: Right;  . Portacath placement N/A 02/27/2014    Procedure: INSERTION PORT-A-CATH;  Surgeon: Stark Klein, MD;  Location: [facility];  Service: General;  Laterality: N/A;  . Peripheral vascular catheterization N/A 09/28/2014    Procedure: Abdominal Aortogram;  Surgeon: Angelia Mould, MD;  Location: [facility];  Service: Cardiovascular;  Laterality: N/A;  . Amputation Left 12/01/2014    Procedure: AMPUTATION Left GREAT TOE;  Surgeon: Angelia Mould, MD;  Location: [facility];  Service: Vascular;  Laterality: Left;    There were no vitals filed for this visit.  Visit Diagnosis:  At risk for falls  Leg weakness, bilateral  Pain in both feet      Subjective Assessment - 02/17/15 1114    Subjective Pt reports she ex husband passed away recently.  She thinks she is doing pretty good except at night. She has trouble sleeping and takes occasional pain med to help her get to sleep    Pertinent History Patient underwent  a right lumpectomy and sentinel node biopsy on 02/05/14 followed by chemotherapy for 10 weeks but stopped due to poor tolerance.  She was hospitalized several times, had dehydration, pneumonia, and C-diff twice.  She was sent to a skilled nursing facility twice after those hospitalizations.  She had radiation on her right breast which was completed today 02/08/15.  She reports all of her toes would turn black at times during her hospitalizations.  She developed a left heel pressure ulcer and was referred to a wound doctor.  She underwent wound dressings and extensive physical therapy to improve her walking.  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  She completed hyperbaric wound treatments until last week.   Patient Stated Goals Improve balance and walk easier   Currently in Pain? Yes   Pain Score 6    Pain Location Foot   Pain Orientation Right;Left   Pain  Type Neuropathic pain                         [facility] Adult PT Treatment/Exercise - 02/17/15 0001    Moist Heat Therapy   Number Minutes Moist Heat 10 Minutes  extra towels    Moist Heat Location Other (comment)  both feet   Electrical Stimulation   Electrical Stimulation Location dorsal and plantar surface of left foot    Electrical Stimulation Action --  TNS unit attended entire treament   Electrical Stimulation Parameters burst 1 mode, intenisty 12-16.  PT needed to adjust program and intesity throughout.   Burst mode, 12-16 intensity   Electrical Stimulation Goals Pain  numbness   Manual Therapy   Manual Therapy Soft tissue mobilization   Soft tissue mobilization to right foot with biotone cream                    Short Term Clinic Goals - 02/08/15 1725    CC Short Term Goal  #1   Title Patient will be able to perform initial home exercise program independently and safely   Time 4   Period Weeks   Status New   CC Short Term Goal  #2   Title Patient will be able to report >/= 20% reduction in pain in her feet to tolerate walking with less difficulty   Time 4   Period Weeks   Status New   CC Short Term Goal  #3   Title Patient will be able to increase BERG balance test score to >/= 50/56 to reduce fall risk   Time 4   Period Weeks   Status New   CC Short Term Goal  #4   Title Patient will be able to decrease the Timed Up and Go score time to be less than 13 seconds for more functional and safe ambulation.   Time 4   Period Weeks   Status New             Long Term Clinic Goals - 02/08/15 1727    CC Long Term Goal  #1   Title Patient will be able to report >/= 40% reduction in pain in her feet to tolerate walking with less difficulty   Time 8   Period Weeks   Status New   CC Long Term Goal  #2   Title Patient will be able to increase BERG balance test score to >/= 52/56 to reduce fall risk   Time 8   Period Weeks   Status New  CC Long Term Goal  #3   Title Patient will be able to decrease the Timed Up and Go score time to be less than 11 seconds for more functional and safe ambulation.   Time 8   Period Weeks   Status New   CC Long Term Goal  #4   Title Patient will be able to verbalize and demonstrate understanding of use of the home TENS unit for pain management if approved by her physician and insurance company.   Time 8   Period Weeks   Status New            Plan - 02/17/15 1205    Clinical Impression Statement pt reports she is continuing with numbness especially at night that interferes with sleep. She has no immedicate relief with treatment today.  She will assess if she notices difference later in the day and let us know if she wantst to try a TNS unit at home     Clinical Impairments Affecting Rehab Potential CIPN   PT Next Visit Plan assess from patient results of last treatment: TNS( on left foot) ,soft tissue work on right foot. Continue with heat packs. TNS trial, send for TNS prescription if she thinks it helped and wants to try it at home, balance exrercise   Consulted and Agree with Plan of Care Patient        Problem List Patient Active Problem List   Diagnosis Date Noted  . Pain of toe of left foot-Great Toe 12/23/2014  . Atrial fibrillation ([place]) 12/07/2014  . Shingles outbreak 10/14/2014  . Cellulitis of great toe 10/14/2014  . Metabolic acidosis 55/73/2202  . Acute on chronic respiratory failure ([place]) 09/07/2014  . Acute on chronic combined systolic and diastolic heart failure ([place]) 09/07/2014  . Ischemic ulcer of toes on both feet ([place])   . Renal failure (ARF), acute on chronic (HCC)   . Thrombocytopenia ([place])   . Palliative care encounter   . Neuropathic pain of both feet ([place])   . Elevated liver function tests   . Weakness   . Renal failure 08/26/2014  . Transaminitis 08/26/2014  . Acute renal failure syndrome ([place])   . Clostridium difficile colitis 08/24/2014  .  Nausea without vomiting 08/24/2014  . Dehydration 08/24/2014  . Hypokalemia 08/24/2014  . Immobility 08/24/2014  . CKD (chronic kidney disease), stage III 08/18/2014  . Acute systolic heart failure ([place])   . Paroxysmal SVT (supraventricular tachycardia) ([place]) 08/17/2014  . UTI (lower urinary tract infection) 08/17/2014  . Protein-calorie malnutrition, severe ([place]) 08/17/2014  . Elevated troponin 08/09/2014  . Antineoplastic chemotherapy induced anemia 06/29/2014  . Enteritis due to Clostridium difficile 05/24/2014  . Breast cancer of upper-outer quadrant of right female breast ([place]) 02/18/2014  . Essential hypertension, benign 02/18/2014   Donato Heinz. Owens Shark, PT  02/17/2015, 12:21 PM  [place], [place], [address] Phone: [phone]   Fax:  [phone]

## 2015-02-22 ENCOUNTER — Ambulatory Visit: Payer: Medicare Other

## 2015-02-22 DIAGNOSIS — Z9181 History of falling: Secondary | ICD-10-CM | POA: Diagnosis not present

## 2015-02-22 DIAGNOSIS — M79672 Pain in left foot: Secondary | ICD-10-CM | POA: Diagnosis not present

## 2015-02-22 DIAGNOSIS — M79671 Pain in right foot: Secondary | ICD-10-CM | POA: Diagnosis not present

## 2015-02-22 DIAGNOSIS — R29898 Other symptoms and signs involving the musculoskeletal system: Secondary | ICD-10-CM | POA: Diagnosis not present

## 2015-02-22 NOTE — Therapy (Signed)
[place], [place], [address] Phone: [phone]   Fax:  [phone]  Physical Therapy Treatment  Patient Details  Name: Cheyenne Riggs MRN: [id number] Date of Birth: 12-Jan-1946 Referring Provider:  Thea Silversmith, MD  Encounter Date: 02/22/2015      PT End of Session - 02/22/15 0935    Visit Number 3   Number of Visits 24   Date for PT Re-Evaluation 04/05/15   PT Start Time 0853   PT Stop Time 0946   PT Time Calculation (min) 53 min   Activity Tolerance Patient tolerated treatment well   Behavior During Therapy [facility] for tasks assessed/performed      Past Medical History  Diagnosis Date  . Back pain   . H/O bladder problems   . Cholelithiasis   . Depression   . High blood pressure   . Hypercholesteremia   . Gum disease   . Wears glasses   . Wears dentures     top  . Cancer   . Breast cancer   . History of kidney stones     leaks  . Complication of anesthesia     bp goes up- now on Lopressor  . Dysrhythmia 08/2014    Afib- episode- was cardioverted in ED [facility]  . CHF (congestive heart failure)   . Pneumonia 05/2014  . History of kidney stones     paseed one, still has 2  . Anxiety     Panic attacks - when she was on Chanix- still has anxiety  . History of blood transfusion   . Shingles   . Thrombocytopenia 4/16  . Rhabdomyolysis 08/2014  . SVT (supraventricular tachycardia) 4/16  . ARF (acute renal failure) 4/16    Past Surgical History  Procedure Laterality Date  . Colon biopsy    . Mouth surgery      implants - denture attatchs to it  . Spine surgery    . Colonoscopy    . Tubal ligation    . Lithotripsy    . Rhinoplasty    . Back surgery  1989    lumb lam  . Breast biopsy Right 02/05/2014    invasive ductal cncer  . Abdominal hysterectomy  1982  . Cholecystectomy  2011    lap choli  . Radioactive seed guided mastectomy with axillary sentinel lymph node biopsy Right  02/27/2014    Procedure: RADIOACTIVE SEED GUIDED RIGHT PARTIAL MASTECTOMY WITH AXILLARY SENTINEL LYMPH NODE BIOPSY;  Surgeon: Stark Klein, MD;  Location: [facility];  Service: General;  Laterality: Right;  . Portacath placement N/A 02/27/2014    Procedure: INSERTION PORT-A-CATH;  Surgeon: Stark Klein, MD;  Location: [facility];  Service: General;  Laterality: N/A;  . Peripheral vascular catheterization N/A 09/28/2014    Procedure: Abdominal Aortogram;  Surgeon: Angelia Mould, MD;  Location: [facility];  Service: Cardiovascular;  Laterality: N/A;  . Amputation Left 12/01/2014    Procedure: AMPUTATION Left GREAT TOE;  Surgeon: Angelia Mould, MD;  Location: [facility];  Service: Vascular;  Laterality: Left;    There were no vitals filed for this visit.  Visit Diagnosis:  At risk for falls  Leg weakness, bilateral  Pain in both feet      Subjective Assessment - 02/22/15 0902    Subjective Didn't tell a difference in my pain after last visit. Willing to try everything again!    Currently in Pain? Yes  Pain Score 5    Pain Location Foot   Pain Orientation Right;Left   Pain Descriptors / Indicators Sharp;Numbness  Sharp is intermittent   Aggravating Factors  being off feet   Pain Relieving Factors pain meds haven't been helping much, pressure on my feet helps                         [facility] Adult PT Treatment/Exercise - 02/22/15 0001    Moist Heat Therapy   Number Minutes Moist Heat 15 Minutes  During IFC   Moist Heat Location Other (comment)  Bil feet, wrapped 1 cervical around feet   Electrical Stimulation   Electrical Stimulation Location Bil dorsal feet, 1 channel to each foot   Electrical Stimulation Action IFC   Electrical Stimulation Parameters 80-150 Hz x15 minutes 35 output on both   Electrical Stimulation Goals Pain  Neuropathy   Manual Therapy   Manual Therapy Soft tissue mobilization   Soft tissue  mobilization to bil feet with biotone cream                    Short Term Clinic Goals - 02/08/15 1725    CC Short Term Goal  #1   Title Patient will be able to perform initial home exercise program independently and safely   Time 4   Period Weeks   Status New   CC Short Term Goal  #2   Title Patient will be able to report >/= 20% reduction in pain in her feet to tolerate walking with less difficulty   Time 4   Period Weeks   Status New   CC Short Term Goal  #3   Title Patient will be able to increase BERG balance test score to >/= 50/56 to reduce fall risk   Time 4   Period Weeks   Status New   CC Short Term Goal  #4   Title Patient will be able to decrease the Timed Up and Go score time to be less than 13 seconds for more functional and safe ambulation.   Time 4   Period Weeks   Status New             Long Term Clinic Goals - 02/08/15 1727    CC Long Term Goal  #1   Title Patient will be able to report >/= 40% reduction in pain in her feet to tolerate walking with less difficulty   Time 8   Period Weeks   Status New   CC Long Term Goal  #2   Title Patient will be able to increase BERG balance test score to >/= 52/56 to reduce fall risk   Time 8   Period Weeks   Status New   CC Long Term Goal  #3   Title Patient will be able to decrease the Timed Up and Go score time to be less than 11 seconds for more functional and safe ambulation.   Time 8   Period Weeks   Status New   CC Long Term Goal  #4   Title Patient will be able to verbalize and demonstrate understanding of use of the home TENS unit for pain management if approved by her physician and insurance company.   Time 8   Period Weeks   Status New            Plan - 02/22/15 0935    Clinical Impression Statement Pt was able to feel  the tingling in Rt foot today but not the Lt so did same output on both. Also did heat to bil feet today as well as pt reports heat feels good. Pt is eager to  continue tryin gtherapy to see if she can get some relief from neuropathy symptoms.    Pt will benefit from skilled therapeutic intervention in order to improve on the following deficits Decreased strength;Impaired sensation;Decreased balance;Pain;Decreased endurance;Abnormal gait   Rehab Potential Good   Clinical Impairments Affecting Rehab Potential CIPN   PT Frequency 3x / week   PT Duration 8 weeks   PT Treatment/Interventions ADLs/Self Care Home Management;Neuromuscular re-education;Passive range of motion;Patient/family education;Gait training;Electrical Stimulation;Therapeutic exercise;Moist Heat;Manual techniques   PT Next Visit Plan assess from patient results of last treatment:  Continue with heat packs and IFC, send for TNS prescription if she thinks it helped and wants to try it at home, balance exrercise prn though pt reports has some she is doing right now from home health therapy.   Consulted and Agree with Plan of Care Patient        Problem List Patient Active Problem List   Diagnosis Date Noted  . Pain of toe of left foot-Great Toe 12/23/2014  . Atrial fibrillation ([place]) 12/07/2014  . Shingles outbreak 10/14/2014  . Cellulitis of great toe 10/14/2014  . Metabolic acidosis 65/46/5035  . Acute on chronic respiratory failure ([place]) 09/07/2014  . Acute on chronic combined systolic and diastolic heart failure ([place]) 09/07/2014  . Ischemic ulcer of toes on both feet ([place])   . Renal failure (ARF), acute on chronic (HCC)   . Thrombocytopenia ([place])   . Palliative care encounter   . Neuropathic pain of both feet ([place])   . Elevated liver function tests   . Weakness   . Renal failure 08/26/2014  . Transaminitis 08/26/2014  . Acute renal failure syndrome ([place])   . Clostridium difficile colitis 08/24/2014  . Nausea without vomiting 08/24/2014  . Dehydration 08/24/2014  . Hypokalemia 08/24/2014  . Immobility 08/24/2014  . CKD (chronic kidney disease), stage III 08/18/2014  .  Acute systolic heart failure ([place])   . Paroxysmal SVT (supraventricular tachycardia) ([place]) 08/17/2014  . UTI (lower urinary tract infection) 08/17/2014  . Protein-calorie malnutrition, severe ([place]) 08/17/2014  . Elevated troponin 08/09/2014  . Antineoplastic chemotherapy induced anemia 06/29/2014  . Enteritis due to Clostridium difficile 05/24/2014  . Breast cancer of upper-outer quadrant of right female breast ([place]) 02/18/2014  . Essential hypertension, benign 02/18/2014    Otelia Limes, PTA 02/22/2015, 9:39 AM  [place], [place], [address] Phone: [phone]   Fax:  [phone]

## 2015-02-24 ENCOUNTER — Ambulatory Visit: Payer: Medicare Other | Admitting: Physical Therapy

## 2015-02-24 DIAGNOSIS — M79671 Pain in right foot: Secondary | ICD-10-CM | POA: Diagnosis not present

## 2015-02-24 DIAGNOSIS — Z9181 History of falling: Secondary | ICD-10-CM | POA: Diagnosis not present

## 2015-02-24 DIAGNOSIS — R29898 Other symptoms and signs involving the musculoskeletal system: Secondary | ICD-10-CM | POA: Diagnosis not present

## 2015-02-24 DIAGNOSIS — M79672 Pain in left foot: Secondary | ICD-10-CM

## 2015-02-24 NOTE — Patient Instructions (Signed)
  Over Head Pull: Narrow Grip       On back, knees bent, feet flat, band across thighs, elbows straight but relaxed. Pull hands apart (start). Keeping elbows straight, bring arms up and over head, hands toward floor. Keep pull steady on band. Hold momentarily. Return slowly, keeping pull steady, back to start.  Also with wide grip  Repeat 10___ times. Band color ___red___   Side Pull: Double Arm   On back, knees bent, feet flat. Arms perpendicular to body, shoulder level, elbows straight but relaxed. Pull arms out to sides, elbows straight. Resistance band comes across collarbones, hands toward floor. Hold momentarily. Slowly return to starting position. Repeat _10__ times. Band color __red___   Elmer Picker   On back, knees bent, feet flat, left hand on left hip, right hand above left. Pull right arm DIAGONALLY (hip to shoulder) across chest. Bring right arm along head toward floor. Hold momentarily. Slowly return to starting position. Repeat _10__ times. Do with left arm. Band color _red_____   Shoulder Rotation: Double Arm   On back, knees bent, feet flat, elbows tucked at sides, bent 90, hands palms up. Pull hands apart and down toward floor, keeping elbows near sides. Hold momentarily. Slowly return to starting position. Repeat 10___ times. Band color __red____

## 2015-02-24 NOTE — Therapy (Signed)
[place], [place], [address] Phone: [phone]   Fax:  [phone]  Physical Therapy Treatment  Patient Details  Name: Cheyenne Riggs MRN: [id number] Date of Birth: 04/11/1946 Referring Provider:  Thea Silversmith, MD  Encounter Date: 02/24/2015       PT End of Session - 02/24/15 1213    Visit Number --   Number of Visits --   Date for PT Re-Evaluation --      Past Medical History  Diagnosis Date  . Back pain   . H/O bladder problems   . Cholelithiasis   . Depression   . High blood pressure   . Hypercholesteremia   . Gum disease   . Wears glasses   . Wears dentures     top  . Cancer   . Breast cancer   . History of kidney stones     leaks  . Complication of anesthesia     bp goes up- now on Lopressor  . Dysrhythmia 08/2014    Afib- episode- was cardioverted in ED [facility]  . CHF (congestive heart failure)   . Pneumonia 05/2014  . History of kidney stones     paseed one, still has 2  . Anxiety     Panic attacks - when she was on Chanix- still has anxiety  . History of blood transfusion   . Shingles   . Thrombocytopenia 4/16  . Rhabdomyolysis 08/2014  . SVT (supraventricular tachycardia) 4/16  . ARF (acute renal failure) 4/16    Past Surgical History  Procedure Laterality Date  . Colon biopsy    . Mouth surgery      implants - denture attatchs to it  . Spine surgery    . Colonoscopy    . Tubal ligation    . Lithotripsy    . Rhinoplasty    . Back surgery  1989    lumb lam  . Breast biopsy Right 02/05/2014    invasive ductal cncer  . Abdominal hysterectomy  1982  . Cholecystectomy  2011    lap choli  . Radioactive seed guided mastectomy with axillary sentinel lymph node biopsy Right 02/27/2014    Procedure: RADIOACTIVE SEED GUIDED RIGHT PARTIAL MASTECTOMY WITH AXILLARY SENTINEL LYMPH NODE BIOPSY;  Surgeon: Stark Klein, MD;  Location: [facility];  Service:  General;  Laterality: Right;  . Portacath placement N/A 02/27/2014    Procedure: INSERTION PORT-A-CATH;  Surgeon: Stark Klein, MD;  Location: [facility];  Service: General;  Laterality: N/A;  . Peripheral vascular catheterization N/A 09/28/2014    Procedure: Abdominal Aortogram;  Surgeon: Angelia Mould, MD;  Location: [facility];  Service: Cardiovascular;  Laterality: N/A;  . Amputation Left 12/01/2014    Procedure: AMPUTATION Left GREAT TOE;  Surgeon: Angelia Mould, MD;  Location: [facility];  Service: Vascular;  Laterality: Left;    There were no vitals filed for this visit.  Visit Diagnosis:  No diagnosis found.      Subjective Assessment - 02/24/15 0854    Subjective Pt had lasting relief from last night until last night, then it was uncomfortable.  Pt states she ordered a TNS online and it should arrive today.   Pertinent History Patient underwent a right lumpectomy and sentinel node biopsy on 02/05/14 followed by chemotherapy for 10 weeks but stopped due to poor tolerance.  She was hospitalized several times, had dehydration, pneumonia, and C-diff twice.  She was sent  to a skilled nursing facility twice after those hospitalizations.  She had radiation on her right breast which was completed today 02/08/15.  She reports all of her toes would turn black at times during her hospitalizations.  She developed a left heel pressure ulcer and was referred to a wound doctor.  She underwent wound dressings and extensive physical therapy to improve her walking.  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  She completed hyperbaric wound treatments until last week.   Currently in Pain? No/denies  last night it was about a 7    Pain Relieving Factors heating pad from microwave                          [facility] Adult PT Treatment/Exercise - 02/24/15 0001    Shoulder Exercises: Supine   Other Supine Exercises supine scapular series with red theraband     Moist Heat Therapy   Number Minutes Moist Heat 20 Minutes  during premod   Moist Heat Location Other (comment)  Bil feet, wrapped 1 cervical around feet   Electrical Stimulation   Electrical Stimulation Location Bil dorsal feet, 1 channel to each foot   Electrical Stimulation Action premod    Electrical Stimulation Parameters intensity left foot 19, right foot 17   Electrical Stimulation Goals Pain                   Short Term Clinic Goals - 02/08/15 1725    CC Short Term Goal  #1   Title Patient will be able to perform initial home exercise program independently and safely   Time 4   Period Weeks   Status New   CC Short Term Goal  #2   Title Patient will be able to report >/= 20% reduction in pain in her feet to tolerate walking with less difficulty   Time 4   Period Weeks   Status New   CC Short Term Goal  #3   Title Patient will be able to increase BERG balance test score to >/= 50/56 to reduce fall risk   Time 4   Period Weeks   Status New   CC Short Term Goal  #4   Title Patient will be able to decrease the Timed Up and Go score time to be less than 13 seconds for more functional and safe ambulation.   Time 4   Period Weeks   Status New             Long Term Clinic Goals - 02/08/15 1727    CC Long Term Goal  #1   Title Patient will be able to report >/= 40% reduction in pain in her feet to tolerate walking with less difficulty   Time 8   Period Weeks   Status New   CC Long Term Goal  #2   Title Patient will be able to increase BERG balance test score to >/= 52/56 to reduce fall risk   Time 8   Period Weeks   Status New   CC Long Term Goal  #3   Title Patient will be able to decrease the Timed Up and Go score time to be less than 11 seconds for more functional and safe ambulation.   Time 8   Period Weeks   Status New   CC Long Term Goal  #4   Title Patient will be able to verbalize and demonstrate understanding of use of the home  TENS unit for  pain management if approved by her physician and insurance company.   Time 8   Period Weeks   Status New            Problem List Patient Active Problem List   Diagnosis Date Noted  . Pain of toe of left foot-Great Toe 12/23/2014  . Atrial fibrillation ([place]) 12/07/2014  . Shingles outbreak 10/14/2014  . Cellulitis of great toe 10/14/2014  . Metabolic acidosis 11/94/1740  . Acute on chronic respiratory failure ([place]) 09/07/2014  . Acute on chronic combined systolic and diastolic heart failure ([place]) 09/07/2014  . Ischemic ulcer of toes on both feet ([place])   . Renal failure (ARF), acute on chronic (HCC)   . Thrombocytopenia ([place])   . Palliative care encounter   . Neuropathic pain of both feet ([place])   . Elevated liver function tests   . Weakness   . Renal failure 08/26/2014  . Transaminitis 08/26/2014  . Acute renal failure syndrome ([place])   . Clostridium difficile colitis 08/24/2014  . Nausea without vomiting 08/24/2014  . Dehydration 08/24/2014  . Hypokalemia 08/24/2014  . Immobility 08/24/2014  . CKD (chronic kidney disease), stage III 08/18/2014  . Acute systolic heart failure ([place])   . Paroxysmal SVT (supraventricular tachycardia) ([place]) 08/17/2014  . UTI (lower urinary tract infection) 08/17/2014  . Protein-calorie malnutrition, severe ([place]) 08/17/2014  . Elevated troponin 08/09/2014  . Antineoplastic chemotherapy induced anemia 06/29/2014  . Enteritis due to Clostridium difficile 05/24/2014  . Breast cancer of upper-outer quadrant of right female breast ([place]) 02/18/2014  . Essential hypertension, benign 02/18/2014    Otelia Limes 02/24/2015, 12:25 PM  [place], [place], [address] Phone: [phone]   Fax:  [phone]

## 2015-02-24 NOTE — Therapy (Signed)
[place], [place], [address] Phone: [phone]   Fax:  [phone]  Physical Therapy Treatment  Patient Details  Name: Cheyenne Riggs MRN: [id number] Date of Birth: 1945-07-29 Referring Provider:  Thea Silversmith, MD  Encounter Date: 02/24/2015      PT End of Session - 02/24/15 1213    Visit Number 4   Number of Visits 24   Date for PT Re-Evaluation 04/05/15   PT Start Time 0845   PT Stop Time 0930   PT Time Calculation (min) 45 min   Activity Tolerance Patient tolerated treatment well   Behavior During Therapy [facility] for tasks assessed/performed      Past Medical History  Diagnosis Date  . Back pain   . H/O bladder problems   . Cholelithiasis   . Depression   . High blood pressure   . Hypercholesteremia   . Gum disease   . Wears glasses   . Wears dentures     top  . Cancer   . Breast cancer   . History of kidney stones     leaks  . Complication of anesthesia     bp goes up- now on Lopressor  . Dysrhythmia 08/2014    Afib- episode- was cardioverted in ED [facility]  . CHF (congestive heart failure)   . Pneumonia 05/2014  . History of kidney stones     paseed one, still has 2  . Anxiety     Panic attacks - when she was on Chanix- still has anxiety  . History of blood transfusion   . Shingles   . Thrombocytopenia 4/16  . Rhabdomyolysis 08/2014  . SVT (supraventricular tachycardia) 4/16  . ARF (acute renal failure) 4/16    Past Surgical History  Procedure Laterality Date  . Colon biopsy    . Mouth surgery      implants - denture attatchs to it  . Spine surgery    . Colonoscopy    . Tubal ligation    . Lithotripsy    . Rhinoplasty    . Back surgery  1989    lumb lam  . Breast biopsy Right 02/05/2014    invasive ductal cncer  . Abdominal hysterectomy  1982  . Cholecystectomy  2011    lap choli  . Radioactive seed guided mastectomy with axillary sentinel lymph node biopsy Right  02/27/2014    Procedure: RADIOACTIVE SEED GUIDED RIGHT PARTIAL MASTECTOMY WITH AXILLARY SENTINEL LYMPH NODE BIOPSY;  Surgeon: Stark Klein, MD;  Location: [facility];  Service: General;  Laterality: Right;  . Portacath placement N/A 02/27/2014    Procedure: INSERTION PORT-A-CATH;  Surgeon: Stark Klein, MD;  Location: [facility];  Service: General;  Laterality: N/A;  . Peripheral vascular catheterization N/A 09/28/2014    Procedure: Abdominal Aortogram;  Surgeon: Angelia Mould, MD;  Location: [facility];  Service: Cardiovascular;  Laterality: N/A;  . Amputation Left 12/01/2014    Procedure: AMPUTATION Left GREAT TOE;  Surgeon: Angelia Mould, MD;  Location: [facility];  Service: Vascular;  Laterality: Left;    There were no vitals filed for this visit.  Visit Diagnosis:  At risk for falls  Leg weakness, bilateral  Pain in both feet      Subjective Assessment - 02/24/15 0854    Subjective Pt had lasting relief from last night until last night, then it was uncomfortable.  Pt states she ordered a TNS online and it  should arrive today.   Pertinent History Patient underwent a right lumpectomy and sentinel node biopsy on 02/05/14 followed by chemotherapy for 10 weeks but stopped due to poor tolerance.  She was hospitalized several times, had dehydration, pneumonia, and C-diff twice.  She was sent to a skilled nursing facility twice after those hospitalizations.  She had radiation on her right breast which was completed today 02/08/15.  She reports all of her toes would turn black at times during her hospitalizations.  She developed a left heel pressure ulcer and was referred to a wound doctor.  She underwent wound dressings and extensive physical therapy to improve her walking.  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  She completed hyperbaric wound treatments until last week.   Currently in Pain? No/denies  last night it was about a 7    Pain  Relieving Factors heating pad from microwave                          [facility] Adult PT Treatment/Exercise - 02/24/15 0001    Knee/Hip Exercises: Standing   Functional Squat 5 reps   Knee/Hip Exercises: Supine   Bridges Strengthening;5 reps   Shoulder Exercises: Supine   Other Supine Exercises supine scapular series with red theraband    Moist Heat Therapy   Number Minutes Moist Heat 20 Minutes  during premod   Moist Heat Location Other (comment)  Bil feet, wrapped 1 cervical around feet   Electrical Stimulation   Electrical Stimulation Location Bil dorsal feet, 1 channel to each foot   Electrical Stimulation Action premod    Electrical Stimulation Parameters intensity left foot 19, right foot 17   Electrical Stimulation Goals Pain   Manual Therapy   Soft tissue mobilization to right  foot with biotone cream pt noted to have tightness along planta fascia                 PT Education - 02/24/15 1255    Education provided Yes   Education Details supine scapular series with red theraband    Person(s) Educated Patient   Methods Explanation;Demonstration   Comprehension Verbalized understanding;Returned demonstration           Short Term Clinic Goals - 02/24/15 1259    CC Short Term Goal  #1   Title Patient will be able to perform initial home exercise program independently and safely   Status On-going   CC Short Term Goal  #2   Title Patient will be able to report >/= 20% reduction in pain in her feet to tolerate walking with less difficulty   Status Achieved   CC Short Term Goal  #3   Title Patient will be able to increase BERG balance test score to >/= 50/56 to reduce fall risk   Status On-going   CC Short Term Goal  #4   Title Patient will be able to decrease the Timed Up and Go score time to be less than 13 seconds for more functional and safe ambulation.   Status On-going             Long Term Clinic Goals - 02/08/15 1727    CC Long  Term Goal  #1   Title Patient will be able to report >/= 40% reduction in pain in her feet to tolerate walking with less difficulty   Time 8   Period Weeks   Status New   CC Long Term Goal  #2  Title Patient will be able to increase BERG balance test score to >/= 52/56 to reduce fall risk   Time 8   Period Weeks   Status New   CC Long Term Goal  #3   Title Patient will be able to decrease the Timed Up and Go score time to be less than 11 seconds for more functional and safe ambulation.   Time 8   Period Weeks   Status New   CC Long Term Goal  #4   Title Patient will be able to verbalize and demonstrate understanding of use of the home TENS unit for pain management if approved by her physician and insurance company.   Time 8   Period Weeks   Status New            Plan - 02/24/15 1256    Clinical Impression Statement Pt feels that she is getting some relief from the treatment and will be getting a TNS unit for home. Upgraded program today to incude proximal strengthening. Performed goal reassessment    Pt will benefit from skilled therapeutic intervention in order to improve on the following deficits Decreased strength;Impaired sensation;Decreased balance;Pain;Decreased endurance;Abnormal gait   Clinical Impairments Affecting Rehab Potential CIPN   PT Next Visit Plan asked pt to bring her TNS unit in, if so, please help her with using it and give her the electrodes we were using here to take home if we no longer will be doing estime here. reinforce strengthening program    Consulted and Agree with Plan of Care Patient        Problem List Patient Active Problem List   Diagnosis Date Noted  . Pain of toe of left foot-Great Toe 12/23/2014  . Atrial fibrillation ([place]) 12/07/2014  . Shingles outbreak 10/14/2014  . Cellulitis of great toe 10/14/2014  . Metabolic acidosis 25/42/7062  . Acute on chronic respiratory failure ([place]) 09/07/2014  . Acute on chronic combined systolic  and diastolic heart failure ([place]) 09/07/2014  . Ischemic ulcer of toes on both feet ([place])   . Renal failure (ARF), acute on chronic (HCC)   . Thrombocytopenia ([place])   . Palliative care encounter   . Neuropathic pain of both feet ([place])   . Elevated liver function tests   . Weakness   . Renal failure 08/26/2014  . Transaminitis 08/26/2014  . Acute renal failure syndrome ([place])   . Clostridium difficile colitis 08/24/2014  . Nausea without vomiting 08/24/2014  . Dehydration 08/24/2014  . Hypokalemia 08/24/2014  . Immobility 08/24/2014  . CKD (chronic kidney disease), stage III 08/18/2014  . Acute systolic heart failure ([place])   . Paroxysmal SVT (supraventricular tachycardia) ([place]) 08/17/2014  . UTI (lower urinary tract infection) 08/17/2014  . Protein-calorie malnutrition, severe ([place]) 08/17/2014  . Elevated troponin 08/09/2014  . Antineoplastic chemotherapy induced anemia 06/29/2014  . Enteritis due to Clostridium difficile 05/24/2014  . Breast cancer of upper-outer quadrant of right female breast ([place]) 02/18/2014  . Essential hypertension, benign 02/18/2014    Norwood Levo 02/24/2015, 1:02 PM  [place], [place], [address] Phone: [phone]   Fax:  [phone]

## 2015-03-01 ENCOUNTER — Other Ambulatory Visit: Payer: Self-pay | Admitting: Hematology and Oncology

## 2015-03-01 ENCOUNTER — Ambulatory Visit: Payer: Medicare Other

## 2015-03-01 DIAGNOSIS — Z9181 History of falling: Secondary | ICD-10-CM | POA: Diagnosis not present

## 2015-03-01 DIAGNOSIS — R29898 Other symptoms and signs involving the musculoskeletal system: Secondary | ICD-10-CM | POA: Diagnosis not present

## 2015-03-01 DIAGNOSIS — M79671 Pain in right foot: Secondary | ICD-10-CM

## 2015-03-01 DIAGNOSIS — M79672 Pain in left foot: Secondary | ICD-10-CM

## 2015-03-01 NOTE — Therapy (Signed)
[place], [place], [address] Phone: [phone]   Fax:  [phone]  Physical Therapy Treatment  Patient Details  Name: Cheyenne Riggs MRN: [id number] Date of Birth: 08/03/1945 No Data Recorded  Encounter Date: 03/01/2015      PT End of Session - 03/01/15 1020    Visit Number 5   Number of Visits 24   Date for PT Re-Evaluation 04/05/15   PT Start Time 0933   PT Stop Time 1017   PT Time Calculation (min) 44 min   Activity Tolerance Patient tolerated treatment well   Behavior During Therapy [facility] for tasks assessed/performed      Past Medical History  Diagnosis Date  . Back pain   . H/O bladder problems   . Cholelithiasis   . Depression   . High blood pressure   . Hypercholesteremia   . Gum disease   . Wears glasses   . Wears dentures     top  . Cancer   . Breast cancer   . History of kidney stones     leaks  . Complication of anesthesia     bp goes up- now on Lopressor  . Dysrhythmia 08/2014    Afib- episode- was cardioverted in ED [facility]  . CHF (congestive heart failure)   . Pneumonia 05/2014  . History of kidney stones     paseed one, still has 2  . Anxiety     Panic attacks - when she was on Chanix- still has anxiety  . History of blood transfusion   . Shingles   . Thrombocytopenia 4/16  . Rhabdomyolysis 08/2014  . SVT (supraventricular tachycardia) 4/16  . ARF (acute renal failure) 4/16    Past Surgical History  Procedure Laterality Date  . Colon biopsy    . Mouth surgery      implants - denture attatchs to it  . Spine surgery    . Colonoscopy    . Tubal ligation    . Lithotripsy    . Rhinoplasty    . Back surgery  1989    lumb lam  . Breast biopsy Right 02/05/2014    invasive ductal cncer  . Abdominal hysterectomy  1982  . Cholecystectomy  2011    lap choli  . Radioactive seed guided mastectomy with axillary sentinel lymph node biopsy Right 02/27/2014    Procedure:  RADIOACTIVE SEED GUIDED RIGHT PARTIAL MASTECTOMY WITH AXILLARY SENTINEL LYMPH NODE BIOPSY;  Surgeon: Stark Klein, MD;  Location: [place];  Service: General;  Laterality: Right;  . Portacath placement N/A 02/27/2014    Procedure: INSERTION PORT-A-CATH;  Surgeon: Stark Klein, MD;  Location: [place];  Service: General;  Laterality: N/A;  . Peripheral vascular catheterization N/A 09/28/2014    Procedure: Abdominal Aortogram;  Surgeon: Angelia Mould, MD;  Location: [place] [facility];  Service: Cardiovascular;  Laterality: N/A;  . Amputation Left 12/01/2014    Procedure: AMPUTATION Left GREAT TOE;  Surgeon: Angelia Mould, MD;  Location: [place];  Service: Vascular;  Laterality: Left;    There were no vitals filed for this visit.  Visit Diagnosis:  At risk for falls  Leg weakness, bilateral  Pain in both feet      Subjective Assessment - 03/01/15 0950    Subjective Got my TENS unit in the mail Friday and I've played with it some but when I tried to put it on I shocked myself! Overall, I  don't think I've noticed a reduction in my pain, I had to take a pain pill last night. But I got relief when you last work with me for about 12 hours, but not as much with my last visit.    Currently in Pain? Yes   Pain Score 5    Pain Location Foot   Pain Orientation Right;Left   Pain Descriptors / Indicators Throbbing  Frozen   Aggravating Factors  being off feet   Pain Relieving Factors TENS has been helpful temporarily and heat                         [facility] Adult PT Treatment/Exercise - 03/01/15 0001    Electrical Stimulation   Electrical Stimulation Location Bil feet: Rt 2 electrodes to dorsal foot, and Lt 1 at plantar surface near toes, then dorsal near heel   Electrical Stimulation Action Pt brought her TENS unit in and she was instructed in how to use this, she liked the Modulation setting. Also instructed pt okay to increase  setting if she feels it less during treatment.    Electrical Stimulation Parameters Rt foot 5 and Lt foot 4 with her TENS unit   Electrical Stimulation Goals Pain   Manual Therapy   Soft tissue mobilization To bil feet plantar and dorsal aspects with biotone cream                PT Education - 03/01/15 1018    Education provided Yes   Education Details Instructed pt in TENS unit use and can use warm or cold water in a water bottle for self massage to dorsal feet and to stretch by hanging heels off bottom step for stretch.    Person(s) Educated Patient   Methods Explanation;Demonstration   Comprehension Verbalized understanding;Returned demonstration           Short Term Clinic Goals - 03/01/15 1026    CC Short Term Goal  #1   Title Patient will be able to perform initial home exercise program independently and safely   Status On-going   CC Short Term Goal  #2   Title Patient will be able to report >/= 20% reduction in pain in her feet to tolerate walking with less difficulty  No long term change noted yet, but does get short term relief from TENS   Status On-going   CC Short Term Goal  #3   Title Patient will be able to increase BERG balance test score to >/= 50/56 to reduce fall risk   Status On-going   CC Short Term Goal  #4   Title Patient will be able to decrease the Timed Up and Go score time to be less than 13 seconds for more functional and safe ambulation.   Status On-going             Long Term Clinic Goals - 03/01/15 1027    CC Long Term Goal  #4   Title Patient will be able to verbalize and demonstrate understanding of use of the home TENS unit for pain management if approved by her physician and insurance company.   Status Achieved            Plan - 03/01/15 1020    Clinical Impression Statement Cheyenne Riggs did very well with instruction of use of her TENS and is excicted to have use of the TENS at home consisitently as it has been giving her  some short term  relief.    Pt will benefit from skilled therapeutic intervention in order to improve on the following deficits Decreased strength;Impaired sensation;Decreased balance;Pain;Decreased endurance;Abnormal gait   Rehab Potential Good   Clinical Impairments Affecting Rehab Potential CIPN   PT Duration 8 weeks   PT Treatment/Interventions ADLs/Self Care Home Management;Neuromuscular re-education;Passive range of motion;Patient/family education;Gait training;Electrical Stimulation;Therapeutic exercise;Moist Heat;Manual techniques   PT Next Visit Plan Cont manual therapy and IFC prn and reinforce strengthening program    PT Home Exercise Plan TENS unit   Consulted and Agree with Plan of Care Patient        Problem List Patient Active Problem List   Diagnosis Date Noted  . Pain of toe of left foot-Great Toe 12/23/2014  . Atrial fibrillation ([place]) 12/07/2014  . Shingles outbreak 10/14/2014  . Cellulitis of great toe 10/14/2014  . Metabolic acidosis 89/37/3428  . Acute on chronic respiratory failure ([place]) 09/07/2014  . Acute on chronic combined systolic and diastolic heart failure ([place]) 09/07/2014  . Ischemic ulcer of toes on both feet ([place])   . Renal failure (ARF), acute on chronic (HCC)   . Thrombocytopenia ([place])   . Palliative care encounter   . Neuropathic pain of both feet ([place])   . Elevated liver function tests   . Weakness   . Renal failure 08/26/2014  . Transaminitis 08/26/2014  . Acute renal failure syndrome ([place])   . Clostridium difficile colitis 08/24/2014  . Nausea without vomiting 08/24/2014  . Dehydration 08/24/2014  . Hypokalemia 08/24/2014  . Immobility 08/24/2014  . CKD (chronic kidney disease), stage III 08/18/2014  . Acute systolic heart failure ([place])   . Paroxysmal SVT (supraventricular tachycardia) ([place]) 08/17/2014  . UTI (lower urinary tract infection) 08/17/2014  . Protein-calorie malnutrition, severe ([place]) 08/17/2014  . Elevated troponin  08/09/2014  . Antineoplastic chemotherapy induced anemia 06/29/2014  . Enteritis due to Clostridium difficile 05/24/2014  . Breast cancer of upper-outer quadrant of right female breast ([place]) 02/18/2014  . Essential hypertension, benign 02/18/2014    Cheyenne Riggs, Cheyenne Riggs 03/01/2015, 10:28 AM  [place], [place], [address] Phone: [phone]   Fax:  [phone]  Name: Cheyenne Riggs MRN: [id number] Date of Birth: 1945/09/03

## 2015-03-02 ENCOUNTER — Other Ambulatory Visit: Payer: Self-pay | Admitting: General Surgery

## 2015-03-03 ENCOUNTER — Other Ambulatory Visit: Payer: Self-pay | Admitting: Hematology and Oncology

## 2015-03-03 ENCOUNTER — Ambulatory Visit: Payer: Medicare Other | Admitting: Physical Therapy

## 2015-03-03 DIAGNOSIS — C50411 Malignant neoplasm of upper-outer quadrant of right female breast: Secondary | ICD-10-CM

## 2015-03-03 DIAGNOSIS — G5793 Unspecified mononeuropathy of bilateral lower limbs: Secondary | ICD-10-CM

## 2015-03-03 MED ORDER — LORAZEPAM 0.5 MG PO TABS: 0.5000 mg | ORAL_TABLET | Freq: Three times a day (TID) | ORAL | Status: DC | PRN

## 2015-03-03 MED ORDER — OXYCODONE-ACETAMINOPHEN 5-325 MG PO TABS: 1.0000 | ORAL_TABLET | Freq: Every evening | ORAL | Status: DC | PRN

## 2015-03-03 NOTE — Pre-Procedure Instructions (Signed)
Cheyenne Riggs  03/03/2015      [id number] [place], [place] [address] [place] [address] Phone: [phone] Fax: [phone]    Your procedure is scheduled on Tues, Oct 25 @ 11:00 AM  Report to [facility] Admitting at 9:00 AM  Call this number if you have problems the morning of surgery:  [phone]   Remember:  Do not eat food or drink liquids after midnight.  Take these medicines the morning of surgery with A SIP OF WATER Prozac(Fluoxetine),Gabapentin(Neurontin),Ativan(Lorazepam),Metoprolol(Lopressor),and Pain Pill(if needed)              No Goody's,BC's,Aleve,Aspirin,Ibuprofen,Fish Oil,or any Herbal Medications.    Do not wear jewelry, make-up or nail polish.  Do not wear lotions, powders, or perfumes.  You may wear deodorant.  Do not shave 48 hours prior to surgery.   Do not bring valuables to the hospital.  [facility] is not responsible for any belongings or valuables.  Contacts, dentures or bridgework may not be worn into surgery.  Leave your suitcase in the car.  After surgery it may be brought to your room.  For patients admitted to the hospital, discharge time will be determined by your treatment team.  Patients discharged the day of surgery will not be allowed to drive home.    Special instructions:  Halfway - Preparing for Surgery  Before surgery, you can play an important role.  Because skin is not sterile, your skin needs to be as free of germs as possible.  You can reduce the number of germs on you skin by washing with CHG (chlorahexidine gluconate) soap before surgery.  CHG is an antiseptic cleaner which kills germs and bonds with the skin to continue killing germs even after washing.  Please DO NOT use if you have an allergy to CHG or antibacterial soaps.  If your skin becomes reddened/irritated stop using the CHG and inform your nurse when you arrive at  Short Stay.  Do not shave (including legs and underarms) for at least 48 hours prior to the first CHG shower.  You may shave your face.  Please follow these instructions carefully:   1.  Shower with CHG Soap the night before surgery and the                                morning of Surgery.  2.  If you choose to wash your hair, wash your hair first as usual with your       normal shampoo.  3.  After you shampoo, rinse your hair and body thoroughly to remove the                      Shampoo.  4.  Use CHG as you would any other liquid soap.  You can apply chg directly       to the skin and wash gently with scrungie or a clean washcloth.  5.  Apply the CHG Soap to your body ONLY FROM THE NECK DOWN.        Do not use on open wounds or open sores.  Avoid contact with your eyes,       ears, mouth and genitals (private parts).  Wash genitals (private parts)       with your normal soap.  6.  Wash thoroughly,  paying special attention to the area where your surgery        will be performed.  7.  Thoroughly rinse your body with warm water from the neck down.  8.  DO NOT shower/wash with your normal soap after using and rinsing off       the CHG Soap.  9.  Pat yourself dry with a clean towel.            10.  Wear clean pajamas.            11.  Place clean sheets on your bed the night of your first shower and do not        sleep with pets.  Day of Surgery  Do not apply any lotions/deoderants the morning of surgery.  Please wear clean clothes to the hospital/surgery center.    Please read over the following fact sheets that you were given. Pain Booklet, Coughing and Deep Breathing and Surgical Site Infection Prevention

## 2015-03-03 NOTE — Addendum Note (Signed)
Addended by: Prentiss Bells on: 03/03/2015 03:02 PM   Modules accepted: Orders

## 2015-03-04 ENCOUNTER — Encounter (HOSPITAL_COMMUNITY)
Admission: RE | Admit: 2015-03-04 | Discharge: 2015-03-04 | Disposition: A | Discharge status: Home / Self Care | Payer: Medicare Other | Source: Ambulatory Visit | Attending: General Surgery | Admitting: General Surgery

## 2015-03-04 ENCOUNTER — Encounter (HOSPITAL_COMMUNITY): Payer: Self-pay

## 2015-03-04 DIAGNOSIS — F329 Major depressive disorder, single episode, unspecified: Secondary | ICD-10-CM | POA: Diagnosis not present

## 2015-03-04 DIAGNOSIS — Z9221 Personal history of antineoplastic chemotherapy: Secondary | ICD-10-CM | POA: Diagnosis not present

## 2015-03-04 DIAGNOSIS — Z79899 Other long term (current) drug therapy: Secondary | ICD-10-CM | POA: Insufficient documentation

## 2015-03-04 DIAGNOSIS — F419 Anxiety disorder, unspecified: Secondary | ICD-10-CM | POA: Diagnosis not present

## 2015-03-04 DIAGNOSIS — I10 Essential (primary) hypertension: Secondary | ICD-10-CM | POA: Diagnosis not present

## 2015-03-04 DIAGNOSIS — Z923 Personal history of irradiation: Secondary | ICD-10-CM | POA: Insufficient documentation

## 2015-03-04 DIAGNOSIS — E7800 Pure hypercholesterolemia, unspecified: Secondary | ICD-10-CM | POA: Diagnosis not present

## 2015-03-04 DIAGNOSIS — I509 Heart failure, unspecified: Secondary | ICD-10-CM | POA: Diagnosis not present

## 2015-03-04 DIAGNOSIS — I252 Old myocardial infarction: Secondary | ICD-10-CM | POA: Diagnosis not present

## 2015-03-04 DIAGNOSIS — C50919 Malignant neoplasm of unspecified site of unspecified female breast: Secondary | ICD-10-CM | POA: Insufficient documentation

## 2015-03-04 DIAGNOSIS — Z01818 Encounter for other preprocedural examination: Secondary | ICD-10-CM | POA: Diagnosis not present

## 2015-03-04 DIAGNOSIS — Z01812 Encounter for preprocedural laboratory examination: Secondary | ICD-10-CM | POA: Diagnosis not present

## 2015-03-04 LAB — CBC
HCT: 41.6 % (ref 36.0–46.0)
Hemoglobin: 13.4 g/dL (ref 12.0–15.0)
MCH: 27.9 pg (ref 26.0–34.0)
MCHC: 32.2 g/dL (ref 30.0–36.0)
MCV: 86.7 fL (ref 78.0–100.0)
PLATELETS: 142 10*3/uL — AB (ref 150–400)
RBC: 4.8 MIL/uL (ref 3.87–5.11)
RDW: 15.1 % (ref 11.5–15.5)
WBC: 4.3 10*3/uL (ref 4.0–10.5)

## 2015-03-04 LAB — BASIC METABOLIC PANEL
Anion gap: 7 (ref 5–15)
BUN: 24 mg/dL — AB (ref 6–20)
CALCIUM: 9.8 mg/dL (ref 8.9–10.3)
CO2: 25 mmol/L (ref 22–32)
CREATININE: 1.11 mg/dL — AB (ref 0.44–1.00)
Chloride: 109 mmol/L (ref 101–111)
GFR calc Af Amer: 58 mL/min — ABNORMAL LOW (ref >60)
GFR, EST NON AFRICAN AMERICAN: 50 mL/min — AB (ref >60)
GLUCOSE: 93 mg/dL (ref 65–99)
Potassium: 4.8 mmol/L (ref 3.5–5.1)
Sodium: 141 mmol/L (ref 135–145)

## 2015-03-04 NOTE — Progress Notes (Signed)
Pt has been instructed by Dr Jearld Pies to stop eliquis on 10/22

## 2015-03-05 ENCOUNTER — Ambulatory Visit: Payer: Medicare Other | Admitting: Physical Therapy

## 2015-03-05 ENCOUNTER — Encounter: Payer: Self-pay | Admitting: Physical Therapy

## 2015-03-05 DIAGNOSIS — M79671 Pain in right foot: Secondary | ICD-10-CM | POA: Diagnosis not present

## 2015-03-05 DIAGNOSIS — M79672 Pain in left foot: Secondary | ICD-10-CM | POA: Diagnosis not present

## 2015-03-05 DIAGNOSIS — R29898 Other symptoms and signs involving the musculoskeletal system: Secondary | ICD-10-CM | POA: Diagnosis not present

## 2015-03-05 DIAGNOSIS — Z23 Encounter for immunization: Secondary | ICD-10-CM | POA: Diagnosis not present

## 2015-03-05 DIAGNOSIS — Z9181 History of falling: Secondary | ICD-10-CM | POA: Diagnosis not present

## 2015-03-05 NOTE — Therapy (Signed)
[place], [place], [address] Phone: [phone]   Fax:  [phone]  Physical Therapy Treatment  Patient Details  Name: Cheyenne Riggs MRN: [id number] Date of Birth: 01/27/1946 No Data Recorded  Encounter Date: 03/05/2015      PT End of Session - 03/05/15 0835    Visit Number 6   Number of Visits 24   Date for PT Re-Evaluation 04/05/15   PT Start Time 0800   PT Stop Time 0845   PT Time Calculation (min) 45 min   Activity Tolerance Patient tolerated treatment well   Behavior During Therapy [facility] for tasks assessed/performed      Past Medical History  Diagnosis Date  . Back pain   . H/O bladder problems   . Cholelithiasis   . Depression   . High blood pressure   . Hypercholesteremia   . Gum disease   . Wears glasses   . Wears dentures     top  . Cancer ([place])   . Breast cancer ([place])   . History of kidney stones     leaks  . Complication of anesthesia     bp goes up- now on Lopressor  . Dysrhythmia 08/2014    Afib- episode- was cardioverted in ED [facility]  . CHF (congestive heart failure) ([place])   . Pneumonia 05/2014  . History of kidney stones     paseed one, still has 2  . Anxiety     Panic attacks - when she was on Chanix- still has anxiety  . History of blood transfusion   . Shingles   . Thrombocytopenia ([place]) 4/16  . Rhabdomyolysis 08/2014  . SVT (supraventricular tachycardia) ([place]) 4/16  . ARF (acute renal failure) ([place]) 4/16    Past Surgical History  Procedure Laterality Date  . Colon biopsy    . Mouth surgery      implants - denture attatchs to it  . Spine surgery    . Colonoscopy    . Tubal ligation    . Lithotripsy    . Rhinoplasty    . Back surgery  1989    lumb lam  . Breast biopsy Right 02/05/2014    invasive ductal cncer  . Abdominal hysterectomy  1982  . Cholecystectomy  2011    lap choli  . Radioactive seed guided mastectomy with axillary sentinel lymph node  biopsy Right 02/27/2014    Procedure: RADIOACTIVE SEED GUIDED RIGHT PARTIAL MASTECTOMY WITH AXILLARY SENTINEL LYMPH NODE BIOPSY;  Surgeon: Stark Klein, MD;  Location: [place];  Service: General;  Laterality: Right;  . Portacath placement N/A 02/27/2014    Procedure: INSERTION PORT-A-CATH;  Surgeon: Stark Klein, MD;  Location: [place];  Service: General;  Laterality: N/A;  . Peripheral vascular catheterization N/A 09/28/2014    Procedure: Abdominal Aortogram;  Surgeon: Angelia Mould, MD;  Location: [place] [facility];  Service: Cardiovascular;  Laterality: N/A;  . Amputation Left 12/01/2014    Procedure: AMPUTATION Left GREAT TOE;  Surgeon: Angelia Mould, MD;  Location: [facility];  Service: Vascular;  Laterality: Left;  . Toe amputation      There were no vitals filed for this visit.  Visit Diagnosis:  At risk for falls  Leg weakness, bilateral  Pain in both feet      Subjective Assessment - 03/05/15 0801    Subjective My TENS unit helps manage my pain while it's on but it doesn't last if  I'm not wearing it.   Pertinent History Patient underwent a right lumpectomy and sentinel node biopsy on 02/05/14 followed by chemotherapy for 10 weeks but stopped due to poor tolerance.  She was hospitalized several times, had dehydration, pneumonia, and C-diff twice.  She was sent to a skilled nursing facility twice after those hospitalizations.  She had radiation on her right breast which was completed today 02/08/15.  She reports all of her toes would turn black at times during her hospitalizations.  She developed a left heel pressure ulcer and was referred to a wound doctor.  She underwent wound dressings and extensive physical therapy to improve her walking.  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  She completed hyperbaric wound treatments until last week.   Patient Stated Goals Improve balance and walk easier   Currently in Pain? Yes   Pain Score 5     Pain Location Foot   Pain Orientation Right;Left   Pain Descriptors / Indicators Burning;Throbbing   Pain Type Neuropathic pain   Pain Onset More than a month ago   Pain Frequency Intermittent                         [facility] Adult PT Treatment/Exercise - 03/05/15 0001    Moist Heat Therapy   Number Minutes Moist Heat 15 Minutes   Moist Heat Location Other (comment)  Bil feet, wrapped cervical hotacks around each foot   Electrical Stimulation   Electrical Stimulation Location Bil feet: electrodes to plantar feet   Electrical Stimulation Parameters Interferential current to bilateral plantar feet in supine with LE elevated intensity 14 x15 minutes   Electrical Stimulation Goals Pain   Manual Therapy   Soft tissue mobilization To bil feet plantar and dorsal aspects with biotone cream                   Short Term Clinic Goals - 03/01/15 1026    CC Short Term Goal  #1   Title Patient will be able to perform initial home exercise program independently and safely   Status On-going   CC Short Term Goal  #2   Title Patient will be able to report >/= 20% reduction in pain in her feet to tolerate walking with less difficulty  No long term change noted yet, but does get short term relief from TENS   Status On-going   CC Short Term Goal  #3   Title Patient will be able to increase BERG balance test score to >/= 50/56 to reduce fall risk   Status On-going   CC Short Term Goal  #4   Title Patient will be able to decrease the Timed Up and Go score time to be less than 13 seconds for more functional and safe ambulation.   Status On-going             Long Term Clinic Goals - 03/01/15 1027    CC Long Term Goal  #4   Title Patient will be able to verbalize and demonstrate understanding of use of the home TENS unit for pain management if approved by her physician and insurance company.   Status Achieved            Plan - 03/05/15 0835    Clinical  Impression Statement Pain is well controlled while wearing TENS unit but poorly controlled without it.  She is ready to begin balance re-education and proprioception exercises to reduce fall risk and  improve funcitonal mobility.   Pt will benefit from skilled therapeutic intervention in order to improve on the following deficits Decreased strength;Impaired sensation;Decreased balance;Pain;Decreased endurance;Abnormal gait   Rehab Potential Good   Clinical Impairments Affecting Rehab Potential CIPN   PT Frequency 3x / week   PT Duration 8 weeks   PT Treatment/Interventions ADLs/Self Care Home Management;Neuromuscular re-education;Passive range of motion;Patient/family education;Gait training;Electrical Stimulation;Therapeutic exercise;Moist Heat;Manual techniques   PT Next Visit Plan Continue manual therapy and stim for pain but begin balance exercises   Consulted and Agree with Plan of Care Patient        Problem List Patient Active Problem List   Diagnosis Date Noted  . Pain of toe of left foot-Great Toe 12/23/2014  . Atrial fibrillation ([place]) 12/07/2014  . Shingles outbreak 10/14/2014  . Cellulitis of great toe 10/14/2014  . Metabolic acidosis 52/77/8242  . Acute on chronic respiratory failure ([place]) 09/07/2014  . Acute on chronic combined systolic and diastolic heart failure ([place]) 09/07/2014  . Ischemic ulcer of toes on both feet ([place])   . Renal failure (ARF), acute on chronic (HCC)   . Thrombocytopenia ([place])   . Palliative care encounter   . Neuropathic pain of both feet ([place])   . Elevated liver function tests   . Weakness   . Renal failure 08/26/2014  . Transaminitis 08/26/2014  . Acute renal failure syndrome ([place])   . Clostridium difficile colitis 08/24/2014  . Nausea without vomiting 08/24/2014  . Dehydration 08/24/2014  . Hypokalemia 08/24/2014  . Immobility 08/24/2014  . CKD (chronic kidney disease), stage III 08/18/2014  . Acute systolic heart failure ([place])   .  Paroxysmal SVT (supraventricular tachycardia) ([place]) 08/17/2014  . UTI (lower urinary tract infection) 08/17/2014  . Protein-calorie malnutrition, severe ([place]) 08/17/2014  . Elevated troponin 08/09/2014  . Antineoplastic chemotherapy induced anemia 06/29/2014  . Enteritis due to Clostridium difficile 05/24/2014  . Breast cancer of upper-outer quadrant of right female breast ([place]) 02/18/2014  . Essential hypertension, benign 02/18/2014    Annia Friendly, PT 03/05/2015 8:39 AM  [place], [place], [address] Phone: [phone]   Fax:  [phone]  Name: JULINE SANDERFORD MRN: [id number] Date of Birth: 05/11/1946

## 2015-03-05 NOTE — Progress Notes (Addendum)
Anesthesia Chart Review: Patient is a 69 year old female scheduled for removal of Port-a-cath on 03/09/15 by Dr. Barry Dienes.  History includes breast cancer s/p radioactive seed guided right partial mastectomy with axillary SN biopsy and PAC insertion 02/27/14 s/p chemotherapy (stopped in 07/2014 due to C. Difficile and neutropenic sepsis) and radiation (12/23/14-02/08/15), former smoker, HTN, hypercholesterolemia, CHF, anxiety, depression. She was hospitalized 08/2014 with severe transaminitis (possible drug induced) with rhadomyoloysis, acute renal failure, thrombocytopenia, right foot mottling (vascular surgery recommended supportive care but ultimately required left great toe amputation 12/01/14), hypotension, WCT (SVT with aberrancy) requiring cardioversion, NSTEMI (likely demand ischemia secondary to tachycardia/hypotension). She was seen by [facility] with b-blocker therapy recommended. She was not felt to be a candidate for cardiac catheterization. PCP is Dr. Hulan Fess. HEM-ONC is Dr. Lindi Adie. RAD-ONC is Dr. Pablo Ledger.  Primary cardiologist is Dr. Acie Fredrickson, last visit 12/07/14 with six month follow-up recommended.  Meds include Eliquis (to stop 10/22), Prozac, Lasix, Neurontin, Ativan, Lopresor, Percocet, KCl. (Lisinopril had been on hold due to renal function.)   01/12/15 EKG: SR, borderline repolarization abnormality.  08/18/14 Echo: Study Conclusions - Left ventricle: The cavity size was normal. Wall thickness was normal. Systolic function was moderately reduced. The estimated ejection fraction was 35%. Diffuse hypokinesis. - Aortic valve: Mildly thickened, mildly calcified leaflets. There was mild to moderate regurgitation. - Mitral valve: There was mild to moderate regurgitation. - Atrial septum: No defect or patent foramen ovale was identified. - Pulmonic valve: There was mild regurgitation. (Previous EF 55-60% 06/30/14. 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 She is now off chemotherapy.)  12/15/14 CXR: IMPRESSION: 1. Stable enlargement of cardiac silhouette without pulmonary vascular congestion. 2. Small right-sided pleural effusion versus pleural thickening. Post partial mastectomy changes and right axillary lymph node dissection on the right.  Preoperative labs noted. Cr 1.11.  Reviewed above with anesthesiologist Dr. Ermalene Postin. If patient is without CV/CHF symptoms would anticipate that she could proceed with this procedure; however, he would recommend notifying Dr. Acie Fredrickson of surgery plans to see if any had any other recommendations. I have sent Dr. Acie Fredrickson a staff message and will follow-up on Monday.  George Hugh [facility] Short Stay Center/Anesthesiology Phone [phone] 03/05/2015 3:11 PM  Addendum: I spoke with Dr. Acie Fredrickson about surgery plans and that patient reported stopping her Eliquis on 03/06/15. Discussed surgery was posted for MAC, but there was a possibility that case could require GA. He felt that if she was not having any acute CV/CHF symptoms that she would be considered "low risk" for this procedure from an cardiac standpoint. Definitive anesthesia plan following anesthesiologist evaluation tomorrow.  George Hugh [facility] Short Stay Center/Anesthesiology Phone [phone] 03/08/2015 1:31 PM

## 2015-03-08 ENCOUNTER — Ambulatory Visit: Payer: Medicare Other

## 2015-03-08 DIAGNOSIS — M79671 Pain in right foot: Secondary | ICD-10-CM

## 2015-03-08 DIAGNOSIS — M79672 Pain in left foot: Secondary | ICD-10-CM | POA: Diagnosis not present

## 2015-03-08 DIAGNOSIS — Z9181 History of falling: Secondary | ICD-10-CM

## 2015-03-08 DIAGNOSIS — R29898 Other symptoms and signs involving the musculoskeletal system: Secondary | ICD-10-CM

## 2015-03-08 MED ORDER — CEFAZOLIN SODIUM-DEXTROSE 2-3 GM-% IV SOLR
2.0000 g | INTRAVENOUS | Status: AC
  Administered 2015-03-09: 2 g via INTRAVENOUS
  Filled 2015-03-08: qty 50

## 2015-03-08 NOTE — Therapy (Signed)
[place], [place], [address] Phone: [phone]   Fax:  [phone]  Physical Therapy Treatment  Patient Details  Name: Cheyenne Riggs MRN: [id number] Date of Birth: 1945-09-24 No Data Recorded  Encounter Date: 03/08/2015      PT End of Session - 03/08/15 1039    Visit Number 8   Number of Visits 24   Date for PT Re-Evaluation 04/05/15      Past Medical History  Diagnosis Date  . Back pain   . H/O bladder problems   . Cholelithiasis   . Depression   . High blood pressure   . Hypercholesteremia   . Gum disease   . Wears glasses   . Wears dentures     top  . Cancer ([place])   . Breast cancer ([place])   . History of kidney stones     leaks  . Complication of anesthesia     bp goes up- now on Lopressor  . Dysrhythmia 08/2014    Afib- episode- was cardioverted in ED [facility]  . CHF (congestive heart failure) ([place])   . Pneumonia 05/2014  . History of kidney stones     paseed one, still has 2  . Anxiety     Panic attacks - when she was on Chanix- still has anxiety  . History of blood transfusion   . Shingles   . Thrombocytopenia ([place]) 4/16  . Rhabdomyolysis 08/2014  . SVT (supraventricular tachycardia) ([place]) 4/16  . ARF (acute renal failure) ([place]) 4/16    Past Surgical History  Procedure Laterality Date  . Colon biopsy    . Mouth surgery      implants - denture attatchs to it  . Spine surgery    . Colonoscopy    . Tubal ligation    . Lithotripsy    . Rhinoplasty    . Back surgery  1989    lumb lam  . Breast biopsy Right 02/05/2014    invasive ductal cncer  . Abdominal hysterectomy  1982  . Cholecystectomy  2011    lap choli  . Radioactive seed guided mastectomy with axillary sentinel lymph node biopsy Right 02/27/2014    Procedure: RADIOACTIVE SEED GUIDED RIGHT PARTIAL MASTECTOMY WITH AXILLARY SENTINEL LYMPH NODE BIOPSY;  Surgeon: Stark Klein, MD;  Location: [facility];   Service: General;  Laterality: Right;  . Portacath placement N/A 02/27/2014    Procedure: INSERTION PORT-A-CATH;  Surgeon: Stark Klein, MD;  Location: [facility];  Service: General;  Laterality: N/A;  . Peripheral vascular catheterization N/A 09/28/2014    Procedure: Abdominal Aortogram;  Surgeon: Angelia Mould, MD;  Location: [facility];  Service: Cardiovascular;  Laterality: N/A;  . Amputation Left 12/01/2014    Procedure: AMPUTATION Left GREAT TOE;  Surgeon: Angelia Mould, MD;  Location: [facility];  Service: Vascular;  Laterality: Left;  . Toe amputation      There were no vitals filed for this visit.  Visit Diagnosis:  At risk for falls  Leg weakness, bilateral  Pain in both feet      Subjective Assessment - 03/08/15 0935    Subjective Stopped taking my blood thinner (Eliquis) a few days ago for my surgery tomorrow, I'm having my port removed. I didn't have to take any pain meds last night to sleep, so thats good. And today my pain isn't as bad as it's been.   Currently in Pain? Yes  Pain Score 5    Pain Location Foot   Pain Orientation Right;Left   Pain Descriptors / Indicators Burning;Aching   Pain Radiating Towards Lt foot is worse today.   Aggravating Factors  It's feeling better today   Pain Relieving Factors TENS unit                         [facility] Adult PT Treatment/Exercise - 03/08/15 0001    Knee/Hip Exercises: Stretches   Active Hamstring Stretch Both;3 reps;20 seconds  Seated in chair   Quad Stretch Both;2 reps;10 seconds   Quad Stretch Limitations Ended up using chair for both, but able to grab Rt ankle, Lt too tight   Piriformis Stretch Both;3 reps;20 seconds  Seated in chair   Knee/Hip Exercises: Aerobic   Stationary Bike Level 1 x3 minutes, stopped due LE fatigue and getting SOB   Knee/Hip Exercises: Standing   Heel Raises Both;2 sets;10 reps  With toe raises as well   Hip Flexion Stengthening;Both;10  reps;Knee bent  Standing on Airex   Hip Flexion Limitations Cuing for slow and controlled   Hip Abduction Stengthening;Both;10 reps  Standing on Airex   Hip Extension Stengthening;Both;10 reps  Standing on Airex   Other Standing Knee Exercises Standing at back of recumbent bike for all    Moist Heat Therapy   Number Minutes Moist Heat 15 Minutes   Moist Heat Location Other (comment)  Bil feet, wrapped cervical pack around both feet together   Electrical Stimulation   Electrical Stimulation Location Bil feet: electrodes to plantar feet   Electrical Stimulation Action IFC   Electrical Stimulation Parameters 80-150 Hz  Rt foot 18 output, Lt foot 11 output, x15 minutes   Electrical Stimulation Goals Pain   Manual Therapy   Manual Therapy Soft tissue mobilization   Soft tissue mobilization To bil feet plantar and dorsal aspects with biotone cream                PT Education - 03/08/15 1038    Education provided Yes   Education Details Instructed pt to begin daily walking routine to try to work up to 30 minutes/day eventually; and to start doing quad stretches that we did today with dining room chair and table at home.   Person(s) Educated Patient   Methods Explanation   Comprehension Verbalized understanding           Short Term Clinic Goals - 03/01/15 1026    CC Short Term Goal  #1   Title Patient will be able to perform initial home exercise program independently and safely   Status On-going   CC Short Term Goal  #2   Title Patient will be able to report >/= 20% reduction in pain in her feet to tolerate walking with less difficulty  No long term change noted yet, but does get short term relief from TENS   Status On-going   CC Short Term Goal  #3   Title Patient will be able to increase BERG balance test score to >/= 50/56 to reduce fall risk   Status On-going   CC Short Term Goal  #4   Title Patient will be able to decrease the Timed Up and Go score time to be less  than 13 seconds for more functional and safe ambulation.   Status On-going             Long Term Clinic Goals - 03/01/15 1027    CC  Long Term Goal  #4   Title Patient will be able to verbalize and demonstrate understanding of use of the home TENS unit for pain management if approved by her physician and insurance company.   Status Achieved            Plan - 03/08/15 1034    Clinical Impression Statement Began balance activites/endurance strengthening today with Ms. Aul and she tolerated this well reporting just feeling some muscle tightness in Lt upper thigh after, no increase in CIPN symptoms after. Also had her do some LE flexibility as she has been c/o muscle tihgtness, especially after exercises today.    Pt will benefit from skilled therapeutic intervention in order to improve on the following deficits Decreased strength;Impaired sensation;Decreased balance;Pain;Decreased endurance;Abnormal gait   Rehab Potential Good   Clinical Impairments Affecting Rehab Potential CIPN   PT Frequency 3x / week   PT Duration 8 weeks   PT Treatment/Interventions ADLs/Self Care Home Management;Neuromuscular re-education;Passive range of motion;Patient/family education;Gait training;Electrical Stimulation;Therapeutic exercise;Moist Heat;Manual techniques   PT Next Visit Plan Issue handouts for hip 3 way and LE flexibility done today.   PT Home Exercise Plan TENS unit; begin daily walking program   Consulted and Agree with Plan of Care Patient        Problem List Patient Active Problem List   Diagnosis Date Noted  . Pain of toe of left foot-Great Toe 12/23/2014  . Atrial fibrillation ([place]) 12/07/2014  . Shingles outbreak 10/14/2014  . Cellulitis of great toe 10/14/2014  . Metabolic acidosis 03/50/0938  . Acute on chronic respiratory failure ([place]) 09/07/2014  . Acute on chronic combined systolic and diastolic heart failure ([place]) 09/07/2014  . Ischemic ulcer of toes on both feet ([place])    . Renal failure (ARF), acute on chronic (HCC)   . Thrombocytopenia ([place])   . Palliative care encounter   . Neuropathic pain of both feet ([place])   . Elevated liver function tests   . Weakness   . Renal failure 08/26/2014  . Transaminitis 08/26/2014  . Acute renal failure syndrome ([place])   . Clostridium difficile colitis 08/24/2014  . Nausea without vomiting 08/24/2014  . Dehydration 08/24/2014  . Hypokalemia 08/24/2014  . Immobility 08/24/2014  . CKD (chronic kidney disease), stage III 08/18/2014  . Acute systolic heart failure ([place])   . Paroxysmal SVT (supraventricular tachycardia) ([place]) 08/17/2014  . UTI (lower urinary tract infection) 08/17/2014  . Protein-calorie malnutrition, severe ([place]) 08/17/2014  . Elevated troponin 08/09/2014  . Antineoplastic chemotherapy induced anemia 06/29/2014  . Enteritis due to Clostridium difficile 05/24/2014  . Breast cancer of upper-outer quadrant of right female breast ([place]) 02/18/2014  . Essential hypertension, benign 02/18/2014    Otelia Limes, PTA 03/08/2015, 10:42 AM  [facility] [place], [place], [address] Phone: [phone]   Fax:  [phone]  Name: SHENOA HATTABAUGH MRN: [id number] Date of Birth: [birth year]

## 2015-03-08 NOTE — H&P (Signed)
Cheyenne Riggs is an 69 y.o. female.   Chief Complaint: breast cancer HPI:  Pt is 69 yo F 1 year post op from right breast conservation surgery.  She is done with chemotherapy and desires port removal.  Past Medical History  Diagnosis Date  . Back pain   . H/O bladder problems   . Cholelithiasis   . Depression   . High blood pressure   . Hypercholesteremia   . Gum disease   . Wears glasses   . Wears dentures     top  . Cancer ([place])   . Breast cancer ([place])   . History of kidney stones     leaks  . Complication of anesthesia     bp goes up- now on Lopressor  . Dysrhythmia 08/2014    Afib- episode- was cardioverted in ED [facility]  . CHF (congestive heart failure) ([place])   . Pneumonia 05/2014  . History of kidney stones     paseed one, still has 2  . Anxiety     Panic attacks - when she was on Chanix- still has anxiety  . History of blood transfusion   . Shingles   . Thrombocytopenia ([place]) 4/16  . Rhabdomyolysis 08/2014  . SVT (supraventricular tachycardia) ([place]) 4/16  . ARF (acute renal failure) ([place]) 4/16    Past Surgical History  Procedure Laterality Date  . Colon biopsy    . Mouth surgery      implants - denture attatchs to it  . Spine surgery    . Colonoscopy    . Tubal ligation    . Lithotripsy    . Rhinoplasty    . Back surgery  1989    lumb lam  . Breast biopsy Right 02/05/2014    invasive ductal cncer  . Abdominal hysterectomy  1982  . Cholecystectomy  2011    lap choli  . Radioactive seed guided mastectomy with axillary sentinel lymph node biopsy Right 02/27/2014    Procedure: RADIOACTIVE SEED GUIDED RIGHT PARTIAL MASTECTOMY WITH AXILLARY SENTINEL LYMPH NODE BIOPSY;  Surgeon: Stark Klein, MD;  Location: [facility];  Service: General;  Laterality: Right;  . Portacath placement N/A 02/27/2014    Procedure: INSERTION PORT-A-CATH;  Surgeon: Stark Klein, MD;  Location: [facility];  Service: General;  Laterality: N/A;  . Peripheral  vascular catheterization N/A 09/28/2014    Procedure: Abdominal Aortogram;  Surgeon: Angelia Mould, MD;  Location: [facility];  Service: Cardiovascular;  Laterality: N/A;  . Amputation Left 12/01/2014    Procedure: AMPUTATION Left GREAT TOE;  Surgeon: Angelia Mould, MD;  Location: [facility];  Service: Vascular;  Laterality: Left;  . Toe amputation      Family History  Problem Relation Age of Onset  . Stroke Mother     onset in her 48's  . Hypertension Mother   . Hyperlipidemia Mother   . Cancer - Other Father     eye cancer, black lung diseae  . Cancer Father   . Heart failure Sister   . Hypertension Sister   . Hypertension Brother   . Heart attack Neg Hx    Social History:  reports that she quit smoking about 9 months ago. She does not have any smokeless tobacco history on file. She reports that she does not drink alcohol or use illicit drugs.  Allergies:  Allergies  Allergen Reactions  . Ambien [Zolpidem Tartrate] Other (See Comments)    Per Pt: causes sleepwalking,  makes loopy.  . Ciprofloxacin Nausea And Vomiting  . Demerol [Meperidine] Hives  . Lidocaine     palpatations  . Other Swelling    Eye ointments  . Septra [Sulfamethoxazole-Trimethoprim] Nausea And Vomiting  . Codeine Rash  . Novocain [Procaine] Palpitations  . Vancomycin Rash    No prescriptions prior to admission    No results found for this or any previous visit (from the past 48 hour(s)). No results found.  Review of Systems  All other systems reviewed and are negative.   There were no vitals taken for this visit. Physical Exam  Constitutional: She is oriented to person, place, and time. She appears well-developed and well-nourished. No distress.  HENT:  Head: Normocephalic.  Eyes: Conjunctivae are normal. Pupils are equal, round, and reactive to light.  Neck: Normal range of motion. Neck supple.  Cardiovascular: Normal rate and regular rhythm.   Respiratory: Effort normal.   GI: Soft. She exhibits no distension. There is no tenderness.  Musculoskeletal: Normal range of motion.  Neurological: She is alert and oriented to person, place, and time.  Skin: Skin is warm and dry. She is not diaphoretic.  Psychiatric: She has a normal mood and affect. Her behavior is normal. Judgment and thought content normal.     Assessment/Plan Right breast cancer No evidence of disease. Remove port a cath. Discussed risks and benefits of surgery.  Pt agrees to proceed.    Cheyenne Riggs 03/08/2015, 7:27 PM

## 2015-03-09 ENCOUNTER — Telehealth: Payer: Self-pay | Admitting: Hematology and Oncology

## 2015-03-09 ENCOUNTER — Ambulatory Visit (HOSPITAL_COMMUNITY): Payer: Medicare Other | Admitting: Vascular Surgery

## 2015-03-09 ENCOUNTER — Encounter (HOSPITAL_COMMUNITY): Payer: Self-pay | Admitting: General Surgery

## 2015-03-09 ENCOUNTER — Encounter (HOSPITAL_COMMUNITY)
Admission: RE | Disposition: A | Discharge status: Home / Self Care | Payer: Self-pay | Source: Ambulatory Visit | Attending: General Surgery

## 2015-03-09 ENCOUNTER — Ambulatory Visit (HOSPITAL_COMMUNITY)
Admission: RE | Admit: 2015-03-09 | Discharge: 2015-03-09 | Disposition: A | Discharge status: Home / Self Care | Payer: Medicare Other | Source: Ambulatory Visit | Attending: General Surgery | Admitting: General Surgery

## 2015-03-09 DIAGNOSIS — Z452 Encounter for adjustment and management of vascular access device: Secondary | ICD-10-CM | POA: Diagnosis not present

## 2015-03-09 DIAGNOSIS — Z87442 Personal history of urinary calculi: Secondary | ICD-10-CM | POA: Insufficient documentation

## 2015-03-09 DIAGNOSIS — D649 Anemia, unspecified: Secondary | ICD-10-CM | POA: Insufficient documentation

## 2015-03-09 DIAGNOSIS — Z853 Personal history of malignant neoplasm of breast: Secondary | ICD-10-CM | POA: Insufficient documentation

## 2015-03-09 DIAGNOSIS — I110 Hypertensive heart disease with heart failure: Secondary | ICD-10-CM | POA: Diagnosis not present

## 2015-03-09 DIAGNOSIS — M549 Dorsalgia, unspecified: Secondary | ICD-10-CM | POA: Diagnosis not present

## 2015-03-09 DIAGNOSIS — Z881 Allergy status to other antibiotic agents status: Secondary | ICD-10-CM | POA: Insufficient documentation

## 2015-03-09 DIAGNOSIS — F410 Panic disorder [episodic paroxysmal anxiety] without agoraphobia: Secondary | ICD-10-CM | POA: Insufficient documentation

## 2015-03-09 DIAGNOSIS — Z87891 Personal history of nicotine dependence: Secondary | ICD-10-CM | POA: Diagnosis not present

## 2015-03-09 DIAGNOSIS — Z885 Allergy status to narcotic agent status: Secondary | ICD-10-CM | POA: Diagnosis not present

## 2015-03-09 DIAGNOSIS — I4891 Unspecified atrial fibrillation: Secondary | ICD-10-CM | POA: Diagnosis not present

## 2015-03-09 DIAGNOSIS — Z882 Allergy status to sulfonamides status: Secondary | ICD-10-CM | POA: Insufficient documentation

## 2015-03-09 DIAGNOSIS — F329 Major depressive disorder, single episode, unspecified: Secondary | ICD-10-CM | POA: Insufficient documentation

## 2015-03-09 DIAGNOSIS — I471 Supraventricular tachycardia: Secondary | ICD-10-CM | POA: Diagnosis not present

## 2015-03-09 DIAGNOSIS — G5793 Unspecified mononeuropathy of bilateral lower limbs: Secondary | ICD-10-CM

## 2015-03-09 DIAGNOSIS — C50919 Malignant neoplasm of unspecified site of unspecified female breast: Secondary | ICD-10-CM | POA: Diagnosis not present

## 2015-03-09 DIAGNOSIS — I509 Heart failure, unspecified: Secondary | ICD-10-CM | POA: Diagnosis not present

## 2015-03-09 DIAGNOSIS — E7800 Pure hypercholesterolemia, unspecified: Secondary | ICD-10-CM | POA: Diagnosis not present

## 2015-03-09 DIAGNOSIS — C50411 Malignant neoplasm of upper-outer quadrant of right female breast: Secondary | ICD-10-CM

## 2015-03-09 HISTORY — PX: PORT-A-CATH REMOVAL: SHX5289

## 2015-03-09 SURGERY — REMOVAL PORT-A-CATH
Anesthesia: Monitor Anesthesia Care | Site: Chest

## 2015-03-09 MED ORDER — LACTATED RINGERS IV SOLN
INTRAVENOUS | Status: DC
  Administered 2015-03-09 (×2): via INTRAVENOUS

## 2015-03-09 MED ORDER — PROPOFOL 10 MG/ML IV BOLUS
INTRAVENOUS | Status: AC
  Filled 2015-03-09: qty 20

## 2015-03-09 MED ORDER — LIDOCAINE HCL (PF) 1 % IJ SOLN
INTRAMUSCULAR | Status: AC
  Filled 2015-03-09: qty 30

## 2015-03-09 MED ORDER — 0.9 % SODIUM CHLORIDE (POUR BTL) OPTIME
TOPICAL | Status: DC | PRN
  Administered 2015-03-09: 1000 mL

## 2015-03-09 MED ORDER — BUPIVACAINE HCL (PF) 0.25 % IJ SOLN
INTRAMUSCULAR | Status: AC
  Filled 2015-03-09: qty 30

## 2015-03-09 MED ORDER — PROPOFOL 10 MG/ML IV BOLUS
INTRAVENOUS | Status: DC | PRN
  Administered 2015-03-09: 10 mg via INTRAVENOUS
  Administered 2015-03-09 (×3): 20 mg via INTRAVENOUS
  Administered 2015-03-09: 30 mg via INTRAVENOUS

## 2015-03-09 MED ORDER — MIDAZOLAM HCL 2 MG/2ML IJ SOLN
INTRAMUSCULAR | Status: DC | PRN
  Administered 2015-03-09: 2 mg via INTRAVENOUS

## 2015-03-09 MED ORDER — BUPIVACAINE HCL (PF) 0.25 % IJ SOLN
INTRAMUSCULAR | Status: DC | PRN
  Administered 2015-03-09: 5 mL

## 2015-03-09 MED ORDER — BUPIVACAINE-EPINEPHRINE (PF) 0.25% -1:200000 IJ SOLN
INTRAMUSCULAR | Status: AC
  Filled 2015-03-09: qty 30

## 2015-03-09 MED ORDER — FENTANYL CITRATE (PF) 250 MCG/5ML IJ SOLN
INTRAMUSCULAR | Status: AC
  Filled 2015-03-09: qty 5

## 2015-03-09 MED ORDER — MIDAZOLAM HCL 2 MG/2ML IJ SOLN
INTRAMUSCULAR | Status: AC
  Filled 2015-03-09: qty 4

## 2015-03-09 MED ORDER — PROPOFOL 500 MG/50ML IV EMUL: INTRAVENOUS | Status: DC | PRN

## 2015-03-09 MED ORDER — ONDANSETRON HCL 4 MG/2ML IJ SOLN
INTRAMUSCULAR | Status: DC | PRN
  Administered 2015-03-09: 4 mg via INTRAVENOUS

## 2015-03-09 MED ORDER — OXYCODONE-ACETAMINOPHEN 5-325 MG PO TABS: 1.0000 | ORAL_TABLET | ORAL | Status: DC | PRN

## 2015-03-09 MED ORDER — FENTANYL CITRATE (PF) 250 MCG/5ML IJ SOLN
INTRAMUSCULAR | Status: DC | PRN
  Administered 2015-03-09: 50 ug via INTRAVENOUS
  Administered 2015-03-09: 25 ug via INTRAVENOUS
  Administered 2015-03-09: 50 ug via INTRAVENOUS
  Administered 2015-03-09: 25 ug via INTRAVENOUS

## 2015-03-09 MED ORDER — EPHEDRINE SULFATE 50 MG/ML IJ SOLN
INTRAMUSCULAR | Status: DC | PRN
  Administered 2015-03-09 (×2): 5 mg via INTRAVENOUS

## 2015-03-09 SURGICAL SUPPLY — 35 items
CHLORAPREP W/TINT 10.5 ML (MISCELLANEOUS) ×3 IMPLANT
COVER SURGICAL LIGHT HANDLE (MISCELLANEOUS) ×3 IMPLANT
DECANTER SPIKE VIAL GLASS SM (MISCELLANEOUS) ×2 IMPLANT
DRAPE PED LAPAROTOMY (DRAPES) ×3 IMPLANT
DRAPE UTILITY XL STRL (DRAPES) ×4 IMPLANT
ELECT CAUTERY BLADE 6.4 (BLADE) ×3 IMPLANT
ELECT REM PT RETURN 9FT ADLT (ELECTROSURGICAL) ×3
ELECTRODE REM PT RTRN 9FT ADLT (ELECTROSURGICAL) ×1 IMPLANT
GAUZE SPONGE 4X4 16PLY XRAY LF (GAUZE/BANDAGES/DRESSINGS) ×3 IMPLANT
GLOVE BIO SURGEON STRL SZ 6 (GLOVE) ×3 IMPLANT
GLOVE BIOGEL PI IND STRL 6.5 (GLOVE) ×1 IMPLANT
GLOVE BIOGEL PI IND STRL 8 (GLOVE) IMPLANT
GLOVE BIOGEL PI INDICATOR 6.5 (GLOVE) ×4
GLOVE BIOGEL PI INDICATOR 8 (GLOVE) ×2
GLOVE SURG SS PI 8.0 STRL IVOR (GLOVE) ×2 IMPLANT
GOWN STRL REUS W/ TWL LRG LVL3 (GOWN DISPOSABLE) ×1 IMPLANT
GOWN STRL REUS W/ TWL XL LVL3 (GOWN DISPOSABLE) IMPLANT
GOWN STRL REUS W/TWL 2XL LVL3 (GOWN DISPOSABLE) ×3 IMPLANT
GOWN STRL REUS W/TWL LRG LVL3 (GOWN DISPOSABLE)
GOWN STRL REUS W/TWL XL LVL3 (GOWN DISPOSABLE) ×3
KIT BASIN OR (CUSTOM PROCEDURE TRAY) ×3 IMPLANT
KIT ROOM TURNOVER OR (KITS) ×3 IMPLANT
LIQUID BAND (GAUZE/BANDAGES/DRESSINGS) ×3 IMPLANT
NDL HYPO 25GX1X1/2 BEV (NEEDLE) ×1 IMPLANT
NEEDLE HYPO 25GX1X1/2 BEV (NEEDLE) ×3 IMPLANT
NS IRRIG 1000ML POUR BTL (IV SOLUTION) ×3 IMPLANT
PACK SURGICAL SETUP 50X90 (CUSTOM PROCEDURE TRAY) ×3 IMPLANT
PAD ARMBOARD 7.5X6 YLW CONV (MISCELLANEOUS) ×6 IMPLANT
PENCIL BUTTON HOLSTER BLD 10FT (ELECTRODE) ×3 IMPLANT
SUT MON AB 4-0 PC3 18 (SUTURE) ×3 IMPLANT
SUT VIC AB 3-0 SH 27 (SUTURE) ×3
SUT VIC AB 3-0 SH 27X BRD (SUTURE) ×1 IMPLANT
SYR CONTROL 10ML LL (SYRINGE) ×3 IMPLANT
TOWEL OR 17X24 6PK STRL BLUE (TOWEL DISPOSABLE) ×3 IMPLANT
TOWEL OR 17X26 10 PK STRL BLUE (TOWEL DISPOSABLE) ×3 IMPLANT

## 2015-03-09 NOTE — Progress Notes (Signed)
States somehow hurt left hand yesterday.  No swelling or redness.  Pt states she has been icing it.

## 2015-03-09 NOTE — Discharge Instructions (Signed)
[facility] Office Phone Number [phone]   POST OP INSTRUCTIONS  Always review your discharge instruction sheet given to you by the facility where your surgery was performed.  IF YOU HAVE DISABILITY OR FAMILY LEAVE FORMS, YOU MUST BRING THEM TO THE OFFICE FOR PROCESSING.  DO NOT GIVE THEM TO YOUR DOCTOR.  1. A prescription for pain medication may be given to you upon discharge.  Take your pain medication as prescribed, if needed.  If narcotic pain medicine is not needed, then you may take acetaminophen (Tylenol) or ibuprofen (Advil) as needed. 2. Take your usually prescribed medications unless otherwise directed 3. If you need a refill on your pain medication, please contact your pharmacy.  They will contact our office to request authorization.  Prescriptions will not be filled after 5pm or on week-ends. 4. You should eat very light the first 24 hours after surgery, such as soup, crackers, pudding, etc.  Resume your normal diet the day after surgery 5. It is common to experience some constipation if taking pain medication after surgery.  Increasing fluid intake and taking a stool softener will usually help or prevent this problem from occurring.  A mild laxative (Milk of Magnesia or Miralax) should be taken according to package directions if there are no bowel movements after 48 hours. 6. You may shower in 48 hours.  The surgical glue will flake off in 2-3 weeks.   7. ACTIVITIES:  No strenuous activity or heavy lifting for 1 week.   a. You may drive when you no longer are taking prescription pain medication, you can comfortably wear a seatbelt, and you can safely maneuver your car and apply brakes. b. RETURN TO WORK:  _________1 week if applicable or less if no lifting required.  _______________ Dennis Bast should see your doctor in the office for a follow-up appointment approximately three-four weeks after your surgery.    WHEN TO CALL YOUR DOCTOR: 1. Fever over 101.0 2. Nausea  and/or vomiting. 3. Extreme swelling or bruising. 4. Continued bleeding from incision. 5. Increased pain, redness, or drainage from the incision.  The clinic staff is available to answer your questions during regular business hours.  Please dont hesitate to call and ask to speak to one of the nurses for clinical concerns.  If you have a medical emergency, go to the nearest emergency room or call 911.  A surgeon from [facility] is always on call at the hospital.  For further questions, please visit [email or web address]

## 2015-03-09 NOTE — Interval H&P Note (Signed)
History and Physical Interval Note:  03/09/2015 8:50 AM  Cheyenne Riggs  has presented today for surgery, with the diagnosis of BREAST CANCER  The various methods of treatment have been discussed with the patient and family. After consideration of risks, benefits and other options for treatment, the patient has consented to  Procedure(s): REMOVAL PORT-A-CATH (N/A) as a surgical intervention .  The patient's history has been reviewed, patient examined, no change in status, stable for surgery.  I have reviewed the patient's chart and labs.  Questions were answered to the patient's satisfaction.     Yee Gangi

## 2015-03-09 NOTE — Transfer of Care (Signed)
Immediate Anesthesia Transfer of Care Note  Patient: Cheyenne Riggs  Procedure(s) Performed: Procedure(s): REMOVAL PORT-A-CATH (N/A)  Patient Location: PACU  Anesthesia Type:MAC  Level of Consciousness: awake, alert , oriented, patient cooperative and responds to stimulation  Airway & Oxygen Therapy: Patient Spontanous Breathing and Patient connected to nasal cannula oxygen  Post-op Assessment: Report given to RN, Post -op Vital signs reviewed and stable, Patient moving all extremities X 4 and Patient able to stick tongue midline  Post vital signs: Reviewed and stable  Last Vitals:  Filed Vitals:   03/09/15 1145  BP:   Pulse:   Temp: 36.1 C  Resp:     Complications: No apparent anesthesia complications

## 2015-03-09 NOTE — Telephone Encounter (Signed)
Left message for patient re SCP visit 10/28 @ 2:30 pm. Also confirmed that this appointment is in addition to the appointment with Dr. Pablo Ledger. Per GD coordinate SCP visit with Dr. Pablo Ledger visit. Schedule mailed.

## 2015-03-09 NOTE — Transfer of Care (Signed)
Immediate Anesthesia Transfer of Care Note  Patient: Cheyenne Riggs  Procedure(s) Performed: Procedure(s): REMOVAL PORT-A-CATH (N/A)  Patient Location: PACU  Anesthesia Type:MAC  Level of Consciousness: awake, alert , oriented, patient cooperative and responds to stimulation  Airway & Oxygen Therapy: Patient Spontanous Breathing and Patient connected to nasal cannula oxygen  Post-op Assessment: Report given to RN, Post -op Vital signs reviewed and stable, Patient moving all extremities X 4 and Patient able to stick tongue midline  Post vital signs: Reviewed and stable  Last Vitals:  Filed Vitals:   03/09/15 0901  BP: 168/74  Pulse: 58  Temp: 36.2 C  Resp: 20    Complications: No apparent anesthesia complications

## 2015-03-09 NOTE — Op Note (Signed)
  PRE-OPERATIVE DIAGNOSIS:  un-needed Port-A-Cath for right breast cancer  POST-OPERATIVE DIAGNOSIS:  Same   PROCEDURE:  Procedure(s):  REMOVAL PORT-A-CATH  SURGEON:  Surgeon(s):  Stark Klein, MD  ANESTHESIA:   MAC + local  EBL:   Minimal  SPECIMEN:  None  Complications : none known  Procedure:   Pt was  identified in the holding area and taken to the operating room where she was placed supine on the operating room table.  MAC anesthesia was induced.  The R upper chest was prepped and draped.  The prior incision was anesthetized with local anesthetic.  The incision was opened with a #15 blade.  The subcutaneous tissue was divided with the cautery.  The port was identified and the capsule opened.  The four 2-0 prolene sutures were removed.  The port was then removed and pressure held on the tract.  The catheter appeared intact without evidence of breakage.  The wound was inspected for hemostasis, which was achieved with cautery.  The wound was closed with 3-0 vicryl deep dermal interrupted sutures and 4-0 Monocryl running subcuticular suture.  The wound was cleaned, dried, and dressed with dermabond.  The patient was awakened from anesthesia and taken to the PACU in stable condition.  Needle, sponge, and instrument counts are correct.

## 2015-03-09 NOTE — Anesthesia Preprocedure Evaluation (Signed)
Anesthesia Evaluation  Patient identified by MRN, date of birth, ID band Patient awake    Reviewed: Allergy & Precautions, NPO status , Patient's Chart, lab work & pertinent test results, reviewed documented beta blocker date and time   Airway Mallampati: III  TM Distance: >3 FB Neck ROM: Full    Dental   Pulmonary pneumonia, former smoker,    breath sounds clear to auscultation       Cardiovascular hypertension, Pt. on home beta blockers and Pt. on medications +CHF (EF 35%)  (-) Orthopnea and (-) DOE + dysrhythmias  Rhythm:Regular Rate:Normal  EF 35%. 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 Spoke with Dr. Cathie Olden, pts cardiologist, and states that he would not rule out future ischemic work up but that likely a result of chemotherapy. He would not delay surgery for ischemic/ infected toe for additional workup.   Neuro/Psych PSYCHIATRIC DISORDERS Anxiety Depression    GI/Hepatic negative GI ROS, Neg liver ROS,   Endo/Other  negative endocrine ROS  Renal/GU Renal InsufficiencyRenal disease     Musculoskeletal negative musculoskeletal ROS (+)   Abdominal   Peds  Hematology  (+) anemia ,   Anesthesia Other Findings   Reproductive/Obstetrics                             Anesthesia Physical  Anesthesia Plan  ASA: III  Anesthesia Plan: MAC   Post-op Pain Management:    Induction: Intravenous  Airway Management Planned:   Additional Equipment:   Intra-op Plan:   Post-operative Plan:   Informed Consent: I have reviewed the patients History and Physical, chart, labs and discussed the procedure including the risks, benefits and alternatives for the proposed anesthesia with the patient or authorized representative who has indicated his/her understanding and acceptance.   Dental advisory given  Plan Discussed with: CRNA  Anesthesia Plan Comments:         Anesthesia  Quick Evaluation

## 2015-03-09 NOTE — Anesthesia Postprocedure Evaluation (Signed)
Anesthesia Post Note  Patient: Cheyenne Riggs  Procedure(s) Performed: Procedure(s) (LRB): REMOVAL PORT-A-CATH (N/A)  Anesthesia type: MAC  Patient location: PACU  Post pain: Pain level controlled  Post assessment: Post-op Vital signs reviewed  Last Vitals: BP 128/98 mmHg  Pulse 54  Temp(Src) 36.5 C  Resp 12  Wt 157 lb 11.2 oz (71.532 kg)  SpO2 94%  Post vital signs: Reviewed  Level of consciousness: awake  Complications: No apparent anesthesia complications

## 2015-03-10 ENCOUNTER — Encounter (HOSPITAL_COMMUNITY): Payer: Self-pay | Admitting: General Surgery

## 2015-03-10 ENCOUNTER — Ambulatory Visit: Payer: Medicare Other | Admitting: Physical Therapy

## 2015-03-10 DIAGNOSIS — Z9181 History of falling: Secondary | ICD-10-CM | POA: Diagnosis not present

## 2015-03-10 DIAGNOSIS — R29898 Other symptoms and signs involving the musculoskeletal system: Secondary | ICD-10-CM | POA: Diagnosis not present

## 2015-03-10 DIAGNOSIS — M79671 Pain in right foot: Secondary | ICD-10-CM

## 2015-03-10 DIAGNOSIS — M79672 Pain in left foot: Principal | ICD-10-CM

## 2015-03-10 NOTE — Therapy (Addendum)
[place], [place], [address] Phone: [phone]   Fax:  [phone]  Physical Therapy Treatment  Patient Details  Name: Cheyenne Riggs MRN: [id number] Date of Birth: [birth year] No Data Recorded  Encounter Date: 03/10/2015    Past Medical History  Diagnosis Date  . Back pain   . H/O bladder problems   . Cholelithiasis   . Depression   . High blood pressure   . Hypercholesteremia   . Gum disease   . Wears glasses   . Wears dentures     top  . Cancer ([place])   . Breast cancer ([place])   . History of kidney stones     leaks  . Complication of anesthesia     bp goes up- now on Lopressor  . Dysrhythmia 08/2014    Afib- episode- was cardioverted in ED [facility]  . CHF (congestive heart failure) ([place])   . Pneumonia 05/2014  . History of kidney stones     paseed one, still has 2  . Anxiety     Panic attacks - when she was on Chanix- still has anxiety  . History of blood transfusion   . Shingles   . Thrombocytopenia ([place]) 4/16  . Rhabdomyolysis 08/2014  . SVT (supraventricular tachycardia) ([place]) 4/16  . ARF (acute renal failure) ([place]) 4/16  . Radiation 12/23/14-02/08/15    right breast 45 gray boosted to 16 gray    Past Surgical History  Procedure Laterality Date  . Colon biopsy    . Mouth surgery      implants - denture attatchs to it  . Spine surgery    . Colonoscopy    . Tubal ligation    . Lithotripsy    . Rhinoplasty    . Back surgery  1989    lumb lam  . Breast biopsy Right 02/05/2014    invasive ductal cncer  . Abdominal hysterectomy  1982  . Cholecystectomy  2011    lap choli  . Radioactive seed guided mastectomy with axillary sentinel lymph node biopsy Right 02/27/2014    Procedure: RADIOACTIVE SEED GUIDED RIGHT PARTIAL MASTECTOMY WITH AXILLARY SENTINEL LYMPH NODE BIOPSY;  Surgeon: Stark Klein, MD;  Location: [facility];  Service: General;  Laterality: Right;  . Portacath  placement N/A 02/27/2014    Procedure: INSERTION PORT-A-CATH;  Surgeon: Stark Klein, MD;  Location: [facility];  Service: General;  Laterality: N/A;  . Peripheral vascular catheterization N/A 09/28/2014    Procedure: Abdominal Aortogram;  Surgeon: Angelia Mould, MD;  Location: [facility];  Service: Cardiovascular;  Laterality: N/A;  . Amputation Left 12/01/2014    Procedure: AMPUTATION Left GREAT TOE;  Surgeon: Angelia Mould, MD;  Location: [facility];  Service: Vascular;  Laterality: Left;  . Toe amputation    . Port-a-cath removal N/A 03/09/2015    Procedure: REMOVAL PORT-A-CATH;  Surgeon: Stark Klein, MD;  Location: [facility];  Service: General;  Laterality: N/A;    There were no vitals filed for this visit.  Visit Diagnosis:  Pain in both feet                                  Short Term Clinic Goals - 03/01/15 1026    CC Short Term Goal  #1   Title Patient will be able to perform initial home exercise program independently and safely  Status On-going   CC Short Term Goal  #2   Title Patient will be able to report >/= 20% reduction in pain in her feet to tolerate walking with less difficulty  No long term change noted yet, but does get short term relief from TENS   Status On-going   CC Short Term Goal  #3   Title Patient will be able to increase BERG balance test score to >/= 50/56 to reduce fall risk   Status On-going   CC Short Term Goal  #4   Title Patient will be able to decrease the Timed Up and Go score time to be less than 13 seconds for more functional and safe ambulation.   Status On-going             Long Term Clinic Goals - 05/05/15 1640    CC Long Term Goal  #1   Title Patient will be able to report >/= 40% reduction in pain in her feet to tolerate walking with less difficulty   Status Not Met   CC Long Term Goal  #2   Title Patient will be able to increase BERG balance test score to >/= 52/56 to  reduce fall risk   Status Not Met   CC Long Term Goal  #3   Title Patient will be able to decrease the Timed Up and Go score time to be less than 11 seconds for more functional and safe ambulation.   Status Not Met   CC Long Term Goal  #4   Title Patient will be able to verbalize and demonstrate understanding of use of the home TENS unit for pain management if approved by her physician and insurance company.   Status Achieved            Problem List Patient Active Problem List   Diagnosis Date Noted  . Pain of toe of left foot-Great Toe 12/23/2014  . Atrial fibrillation ([place]) 12/07/2014  . Shingles outbreak 10/14/2014  . Cellulitis of great toe 10/14/2014  . Metabolic acidosis 96/29/5284  . Acute on chronic respiratory failure ([place]) 09/07/2014  . Acute on chronic combined systolic and diastolic heart failure ([place]) 09/07/2014  . Ischemic ulcer of toes on both feet ([place])   . Renal failure (ARF), acute on chronic (HCC)   . Thrombocytopenia ([place])   . Palliative care encounter   . Neuropathic pain of both feet ([place])   . Elevated liver function tests   . Weakness   . Renal failure 08/26/2014  . Transaminitis 08/26/2014  . Acute renal failure syndrome ([place])   . Clostridium difficile colitis 08/24/2014  . Nausea without vomiting 08/24/2014  . Dehydration 08/24/2014  . Hypokalemia 08/24/2014  . Immobility 08/24/2014  . CKD (chronic kidney disease), stage III 08/18/2014  . Acute systolic heart failure ([place])   . Paroxysmal SVT (supraventricular tachycardia) ([place]) 08/17/2014  . UTI (lower urinary tract infection) 08/17/2014  . Protein-calorie malnutrition, severe ([place]) 08/17/2014  . Elevated troponin 08/09/2014  . Antineoplastic chemotherapy induced anemia 06/29/2014  . Enteritis due to Clostridium difficile 05/24/2014  . Breast cancer of upper-outer quadrant of right female breast ([place]) 02/18/2014  . Essential hypertension, benign 02/18/2014    Mclain Freer 05/05/2015,  4:42 PM  [facility], [place], [address] Phone: [phone]   Fax:  [phone]  Name: Cheyenne Riggs MRN: [id number] Date of Birth: [birth year]    Serafina Royals, PT 05/05/2015 4:42 PM  PHYSICAL THERAPY DISCHARGE SUMMARY  Visits from Start of Care: 9  Current functional level related to goals / functional outcomes: See above.   Remaining deficits: Unknown, as the patient did not return after this date, despite a plan to continue therapy.   Education / Equipment: Use of TENS, HEP. Plan: Patient agrees to discharge.  Patient goals were not met. Patient is being discharged due to not returning since the last visit.  ?????    Serafina Royals, PT 05/05/2015 4:42 PM

## 2015-03-12 ENCOUNTER — Encounter: Payer: Self-pay | Admitting: Oncology

## 2015-03-12 ENCOUNTER — Ambulatory Visit (HOSPITAL_BASED_OUTPATIENT_CLINIC_OR_DEPARTMENT_OTHER): Payer: Medicare Other | Admitting: Nurse Practitioner

## 2015-03-12 ENCOUNTER — Ambulatory Visit: Payer: Self-pay | Admitting: Radiation Oncology

## 2015-03-12 VITALS — BP 141/73 | HR 58 | Temp 98.1°F | Resp 18 | Ht 63.0 in | Wt 164.1 lb

## 2015-03-12 DIAGNOSIS — C50411 Malignant neoplasm of upper-outer quadrant of right female breast: Secondary | ICD-10-CM

## 2015-03-12 DIAGNOSIS — G629 Polyneuropathy, unspecified: Secondary | ICD-10-CM | POA: Diagnosis not present

## 2015-03-12 NOTE — Progress Notes (Signed)
CLINIC:  Cancer Survivorship   REASON FOR VISIT:  Routine follow-up post-treatment for a recent history of breast cancer.  BRIEF ONCOLOGIC HISTORY:    Breast cancer of upper-outer quadrant of right female breast (HCC)   01/27/2014 Mammogram Right breast: possible mass measuring 1.6 cm at 11:00, 9 cm from nipple.  Additional imaging needed.   01/30/2014 Breast US Right breast: New irregular equal density mass with indistinct margin at 11:00, middle depth, 9 cm from nipple, measruing 1.8 cm   02/05/2014 Initial Biopsy Right breast core needle bx: Invasive ductal carcinoma, grade 2-3, ER- (0%), PR- (0%), HER2/neu negative (ratio 1.15), Ki67 81%   02/06/2014 Clinical Stage Stage IA: T1c N0   02/27/2014 Definitive Surgery Right breast lumpectomy/SLNB ([facility]): Invasive ductal carcinoma grade 3, 2.5 cm, DCIS intermediate grade, margins negative, 2 SLN negative, ER/PR HER-2 negative   02/27/2014 Pathologic Stage Stage IIA: T2 N0   03/30/2014 - 07/27/2014 Chemotherapy Adjuvant chemotherapy with dose dense doxorubicin and cyclophosphamide x 4 cycles followed by nab-paclitaxel weekly x7/12 (stopped early due to hospitalizations and recurrent C. difficile)   05/17/2014 - 05/26/2014 Hospital Admission Neutropenic fever secondary to dental infection which led to C. difficile enterocolitis, atypical pneumonia and SIRS   08/09/2014 - 08/10/2014 Hospital Admission Hospitalization for nausea vomiting and dehydration related to diarrhea from C. 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 Right breast boost / 16 Gray over 8 fractions. Total dose: 61 Gy    INTERVAL HISTORY:  Cheyenne Riggs presents to the Survivorship Clinic today for our initial meeting to review her  survivorship care plan detailing her treatment course for breast cancer, as well as monitoring long-term side effects of that treatment, education regarding health maintenance, screening, and overall wellness and health promotion.     Overall, Cheyenne Riggs reports feeling well since her radiation was completed one month ago.  She reports that the fatigue continues, but notes that the skin overlying her right breast has almost returned to baseline.  She continues with peripheral neuropathy with minimal improvement with the gabapentin. She reports a fair appetite and states that she is regaining some of the weight that she lost with her hospitalizations.  She has normal bowel function and denies abdominal pain.  She has no mass or nodule within her breast, although does state that she has some thickening since completing radiation.  She denies headache or cough. She will see Dr. Michell Heinrich in a few weeks for follow up, Dr. Donell Beers in November 2016, and Dr. Pamelia Hoit in March 2017.  REVIEW OF SYSTEMS:  General: Fatigue as above. Denies fever or chills.  Cardiac: Denies palpitations, chest pain, and lower extremity edema.  Respiratory: Denies shortness of breath and dyspnea on exertion.  GI: Denies constipation, diarrhea, nausea, or vomiting.  GU: Stress incontinence that predates cancer diagnosis. Denies dysuria, hematuria, vaginal bleeding, vaginal discharge, or vaginal dryness.  Musculoskeletal: Denies joint or bone pain.  Neuro: Peripheral neuropathy as above. Denies headache or recent falls.  Skin: Denies rash, pruritis, or open wounds.  Breast: Denies any new nodularity, masses, tenderness, nipple changes, or nipple discharge.  Psych: Denies depression, anxiety, insomnia, or memory loss.   A 14-point review of systems was completed and was negative, except as noted above.   ONCOLOGY TREATMENT TEAM:  1. Surgeon:  Dr. Barry Dienes at [facility]  2. Medical Oncologist: Dr. Lindi Adie 3. Radiation  Oncologist: Dr. Pablo Ledger    PAST MEDICAL/SURGICAL HISTORY:  Past Medical History  Diagnosis Date  . Back pain   . H/O bladder problems   . Cholelithiasis   . Depression   . High blood pressure   . Hypercholesteremia   . Gum disease   . Wears glasses   . Wears dentures     top  . Cancer ([place])   . Breast cancer ([place])   . History of kidney stones     leaks  . Complication of anesthesia     bp goes up- now on Lopressor  . Dysrhythmia 08/2014    Afib- episode- was cardioverted in ED [facility]  . CHF (congestive heart failure) ([place])   . Pneumonia 05/2014  . History of kidney stones     paseed one, still has 2  . Anxiety     Panic attacks - when she was on Chanix- still has anxiety  . History of blood transfusion   . Shingles   . Thrombocytopenia ([place]) 4/16  . Rhabdomyolysis 08/2014  . SVT (supraventricular tachycardia) ([place]) 4/16  . ARF (acute renal failure) ([place]) 4/16  . Radiation 12/23/14-02/08/15    right breast 45 gray boosted to 16 gray   Past Surgical History  Procedure Laterality Date  . Colon biopsy    . Mouth surgery      implants - denture attatchs to it  . Spine surgery    . Colonoscopy    . Tubal ligation    . Lithotripsy    . Rhinoplasty    . Back surgery  1989    lumb lam  . Breast biopsy Right 02/05/2014    invasive ductal cncer  . Abdominal hysterectomy  1982  . Cholecystectomy  2011    lap choli  . Radioactive seed guided mastectomy with axillary sentinel lymph node biopsy Right 02/27/2014    Procedure: RADIOACTIVE SEED GUIDED RIGHT PARTIAL MASTECTOMY WITH AXILLARY SENTINEL LYMPH NODE BIOPSY;  Surgeon: Stark Klein, MD;  Location: [facility];  Service: General;  Laterality: Right;  . Portacath placement N/A 02/27/2014    Procedure: INSERTION PORT-A-CATH;  Surgeon: Stark Klein, MD;  Location: [facility];  Service: General;  Laterality: N/A;  . Peripheral vascular catheterization N/A 09/28/2014    Procedure: Abdominal Aortogram;   Surgeon: Angelia Mould, MD;  Location: [facility];  Service: Cardiovascular;  Laterality: N/A;  . Amputation Left 12/01/2014    Procedure: AMPUTATION Left GREAT TOE;  Surgeon: Angelia Mould, MD;  Location: [facility];  Service: Vascular;  Laterality: Left;  . Toe amputation    . Port-a-cath removal N/A 03/09/2015    Procedure: REMOVAL PORT-A-CATH;  Surgeon: Stark Klein, MD;  Location: [facility];  Service: General;  Laterality: N/A;     ALLERGIES:  Allergies  Allergen Reactions  . Ambien [Zolpidem Tartrate] Other (See Comments)    Per Pt: causes sleepwalking, makes loopy.  . Ciprofloxacin Nausea And Vomiting  . Demerol [Meperidine] Hives  . Lidocaine     palpatations  . Other Swelling    Eye ointments  . Septra [Sulfamethoxazole-Trimethoprim] Nausea And Vomiting  . Codeine Rash  . Novocain [Procaine] Palpitations  . Vancomycin Rash     CURRENT MEDICATIONS:  Current Outpatient Prescriptions on File Prior to Visit  Medication Sig Dispense Refill  . apixaban (ELIQUIS) 5 MG TABS tablet Take 1 tablet (  5 mg total) by mouth 2 (two) times daily. 60 tablet 11  . FLUoxetine (PROZAC) 10 MG capsule Take 1 capsule (10 mg total) by mouth daily. (Patient taking differently: Take 10 mg by mouth 2 (two) times daily. ) 30 capsule 1  . furosemide (LASIX) 40 MG tablet Take 1 tablet (40 mg total) by mouth daily. (Patient taking differently: Take 20 mg by mouth daily as needed (weight gain). ) 30 tablet 0  . gabapentin (NEURONTIN) 100 MG capsule TAKE (1) TABLET BY MOUTH TWICE A DAY 60 capsule 2  . LORazepam (ATIVAN) 0.5 MG tablet Take 1 tablet (0.5 mg total) by mouth every 8 (eight) hours as needed for anxiety. 30 tablet 0  . metoprolol tartrate (LOPRESSOR) 25 MG tablet Take 1 tablet (25 mg total) by mouth 2 (two) times daily. 60 tablet 5  . oxyCODONE-acetaminophen (PERCOCET/ROXICET) 5-325 MG tablet Take 1-2 tablets by mouth every 4 (four) hours as needed for moderate pain or severe pain.  30 tablet 0  . potassium chloride (MICRO-K) 10 MEQ CR capsule Take 1 capsule (10 mEq total) by mouth 2 (two) times daily. 180 capsule 3   No current facility-administered medications on file prior to visit.     ONCOLOGIC FAMILY HISTORY:  Family History  Problem Relation Age of Onset  . Stroke Mother     onset in her 48's  . Hypertension Mother   . Hyperlipidemia Mother   . Cancer - Other Father     eye cancer, black lung diseae  . Cancer Father   . Heart failure Sister   . Hypertension Sister   . Hypertension Brother   . Heart attack Neg Hx      GENETIC COUNSELING/TESTING: None  SOCIAL HISTORY:  SHEMAIAH ROUND is divorced and lives alone in [place], [place].  She has 1 child.  Ms. Mittelman is currently not working.  She is a former smoker (stopped January 2016) but denies any current or history of alcohol, or illicit drug use.     PHYSICAL EXAMINATION:  Vital Signs:   Filed Vitals:   03/12/15 1427  BP: 141/73  Pulse: 58  Temp: 98.1 F (36.7 C)  Resp: 18   ECOG Performance status: 1 General: Well-nourished, well-appearing female in no acute distress.  She is unaccompanied in clinic today.   HEENT: Head is atraumatic and normocephalic.  Pupils equal and reactive to light and accomodation. Conjunctivae clear without exudate.  Sclerae anicteric. Oral mucosa is pink, moist, and intact without lesions.  Oropharynx is pink without lesions or erythema.  Lymph: No cervical, supraclavicular, infraclavicular, or axillary lymphadenopathy noted on palpation.  Cardiovascular: Regular rate and rhythm without murmurs, rubs, or gallops. Respiratory: Clear to auscultation bilaterally. Chest expansion symmetric without accessory muscle use on inspiration or expiration.  GI: Abdomen soft and round. No tenderness to palpation. Bowel sounds normoactive in 4 quadrants.  GU: Deferred.  Neuro: No focal deficits. Psych: Mood and affect normal and appropriate for situation.    Extremities: No edema, cyanosis, or clubbing.  Skin: Warm and dry. No open lesions noted.   LABORATORY DATA:  None for this visit.  DIAGNOSTIC IMAGING:  None for this visit.     ASSESSMENT AND PLAN:   1. 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  Ms. Reder is doing well without clinical evidence worrisome for disease recurrence.  She will follow-up with her medical oncologist,  Dr. Lindi Adie, in March 2017 with history and physical exam per surveillance protocol.  She will also see Drs. Pablo Ledger and [place] in November 2016.  A comprehensive survivorship care plan and treatment summary was reviewed with the patient today detailing her breast cancer diagnosis, treatment course, potential late/long-term effects of treatment, appropriate follow-up care with recommendations for the future, and patient education resources.  A copy of this summary, along with a letter will be sent to the patient's primary care provider via in basket message after today's visit. I will also send this to Ms. Rhames' cardiologist at her request.  Ms. Torre is welcome to return to the Survivorship Clinic in the future, as needed; no follow-up will be scheduled at this time.    2. Peripheral neuropathy: Ms. Cueva is on gabapentin 100 mg which she takes BID. I advised her that this is low dose and she could attempt a trial of TID to see if it offered better control.  I encouraged her to follow up with her PCP, Dr. Rex Kras, to report whether this was effective and that I would route a copy of this note to him to make him aware of the change.   3. Cancer  screening:  Due to Ms. Dura's history and her age, she should receive screening for skin cancers and colon cancer. The information and recommendations are listed on the patient's comprehensive care plan/treatment summary and were reviewed in detail with the patient.    4. Health maintenance and wellness promotion: Ms. Kreher was encouraged to consume 5-7 servings of fruits and vegetables per day. We reviewed the "Nutrition for Breast Cancer Survivors" handout.  She was also encouraged to engage in moderate to vigorous exercise for 30 minutes per day most days of the week, taking into consideration her physical limitations at this time. We discussed the [employer] fitness program, which is designed for cancer survivors to help them become more physically fit after cancer treatments and she is very interested in this..  She was instructed to limit her alcohol consumption (she does not currently use) and continue to abstain from tobacco use. She was congratulated on remaining smoke free since January!   5. Support services/counseling: It is not uncommon for this period of the patient's cancer care trajectory to be one of many emotions and stressors.  We discussed an opportunity for her to participate in the next session of [facility] ("Finding Your New Normal") support group series designed for patients after they have completed treatment.   Ms. Adderley was encouraged to take advantage of our many other support services programs, support groups, and/or counseling in coping with her new life as a cancer survivor after completing anti-cancer treatment.  She was offered support today through active listening and expressive supportive counseling.  She was given information regarding our available services and encouraged to contact me with any questions or for help enrolling in any of our support group/programs.    A total of 50 minutes of face-to-face time was spent with this patient with greater than 50% of that time  in counseling and care-coordination.   Sylvan Cheese, NP  Survivorship Program [facility] [phone]   Note: PRIMARY CARE PROVIDER Gennette Pac, [place] [phone]

## 2015-03-18 ENCOUNTER — Encounter: Payer: Self-pay | Admitting: Radiation Oncology

## 2015-03-18 ENCOUNTER — Ambulatory Visit
Admission: RE | Admit: 2015-03-18 | Discharge: 2015-03-18 | Disposition: A | Discharge status: Home / Self Care | Payer: Medicare Other | Source: Ambulatory Visit | Attending: Radiation Oncology | Admitting: Radiation Oncology

## 2015-03-18 VITALS — BP 170/74 | HR 64 | Temp 98.0°F | Resp 12 | Wt 162.1 lb

## 2015-03-18 DIAGNOSIS — C50411 Malignant neoplasm of upper-outer quadrant of right female breast: Secondary | ICD-10-CM

## 2015-03-18 NOTE — Progress Notes (Addendum)
She rates her pain as a 5 on a scale of 0-10. intermittent over right hip. Pt complains of fatigue .  Pt right breast-  Warm dry and intact. Port a cath removed approx. 2 weeks ago noted crusting to old site.  Pt denies edema. Pt continues to apply Radiaplex as directed. BP 170/74 mmHg  Pulse 64  Temp(Src) 98 F (36.7 C) (Oral)  Resp 12  Wt 162 lb 1.6 oz (73.528 kg)  SpO2 98%  Pt reports: Yes No Comments  Tamoxifen []  [x]    Arimidex []  [x]    Mammogram [x]   Date: One yr ago []    Next appointment w Med Onc: 08/10/15 Lindi Adie

## 2015-03-18 NOTE — Progress Notes (Signed)
Department of Radiation Oncology  Phone:  [phone] Fax:        [phone]   Name: Cheyenne Riggs MRN: [id number]  DOB: 08/20/1945  Date: 03/18/2015  Follow Up Visit Note  Diagnosis: Breast cancer of upper-outer quadrant of right female breast [facility])   Staging form: Breast, AJCC 7th Edition     Clinical: Stage IA (T1c, N0, cM0) - Signed by Thea Silversmith, MD on 02/18/2014     Pathologic: Stage IIA (T2, N0, cM0) - Signed by Rulon Eisenmenger, MD on 03/12/2014   Summary and Interval since last radiation: The patient's radiation treatment dates extended from 12/23/2014-02/08/2015. The site and dose included the Right breast / 45 Gray @ 1.8 Pearline Cables per fraction x 25 fractions and the Right breast boost / 16 Gray at [employer] per fraction x 8 fractions. The beams and energy included Opposed Tangents / 6 MV photons and En face / 15 MeV photons.  Interval History: Cheyenne Riggs presents today for her routine follow-up appointment with radiation oncology. The patient currently rates her pain as a 5 on a scale of 0-10. This pain is reported to be intermittent over the right hip. She additionally reports fatigue and applies Radiaplex. continues to apply Radiaplex as directed. She reports seeing Dr. Barry Dienes since her last radiation oncology appointment. Her Port-A-Cath was removed on October 25th of 2016. She has met with Survivorship as well. "It was redundant, but gave you an opportunity to ask questions and provide you with some resources. It was just right for me," she stated about Survivorship. 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 The physical therapy helps, but then I get home and it hurts," the patient stated in regards to her physical therapy. In response, her medication has been since increased. This has helped alleviate the symptom of pain in her feet. The patient projected a healthy mental status and was not accompanied by family today.  Physical Exam: The patient is alert and oriented x3. There is no  significant changes to the status of overall health to be noted at this time. Filed Vitals:   03/18/15 0859  BP: 170/74  Pulse: 64  Temp: 98 F (36.7 C)  TempSrc: Oral  Resp: 12  Weight: 162 lb 1.6 oz (73.528 kg)  SpO2: 98%  Breast: Mild hyperpigmentation over the right breast. Incision from Port-A-Cath removal in the upper right chest wall is healing well, with surgical glue in place.  IMPRESSION: Cheyenne Riggs is a 69 y.o. female presenting to clinic in regards to her breast cancer of upper-outer quadrant of right female breast. She is recovering well from the effects of radiation and her most recent surgery. She understands the importance of using sun protection and the importance of completing annual mammograms. She understands the benefits and necessity of continuing to attend the recommended physical therapy. She is aware that she may access her appointments and medical records via [place].   PLAN: She is doing well. We discussed the need for follow up every 4-6 months which she has scheduled.  We discussed the need for yearly mammograms which she can schedule with her OBGYN or with medical oncology. We discussed the need for sun protection in the treated area.  She can always call me with questions.  I will follow up with her on an as needed basis. She is aware of her appointment with Dr. Lindi Adie, MD to take place in March 28th of 2016. If a mammogram is not scheduled, she has been instructed to  scheduled a mammogram as soon as possible and to call for an order if needed. All vocalized questions and concerns have been addressed. If the patient develops any further questions or concerns in regards to her treatment and recovery, she has been encouraged to contact Dr. Pablo Ledger, MD.   This document serves as a record of services personally performed by Thea Silversmith , MD. It was created on her behalf by Lenn Cal, a trained medical scribe. The creation of this record is based on the scribe's  personal observations and the provider's statements to them. This document has been checked and approved by the attending provider.   ------------------------------------------------  Thea Silversmith, MD

## 2015-03-20 ENCOUNTER — Other Ambulatory Visit: Payer: Self-pay | Admitting: Hematology and Oncology

## 2015-03-22 ENCOUNTER — Other Ambulatory Visit: Payer: Self-pay | Admitting: Hematology and Oncology

## 2015-03-22 ENCOUNTER — Other Ambulatory Visit: Payer: Self-pay | Admitting: *Deleted

## 2015-03-22 DIAGNOSIS — C50411 Malignant neoplasm of upper-outer quadrant of right female breast: Secondary | ICD-10-CM

## 2015-03-22 MED ORDER — LORAZEPAM 0.5 MG PO TABS: 0.5000 mg | ORAL_TABLET | Freq: Three times a day (TID) | ORAL | Status: DC | PRN

## 2015-03-22 MED ORDER — GABAPENTIN 100 MG PO CAPS: ORAL_CAPSULE | ORAL | Status: DC

## 2015-03-24 ENCOUNTER — Encounter: Payer: Medicare Other | Admitting: Physical Therapy

## 2015-03-25 ENCOUNTER — Ambulatory Visit: Payer: Self-pay | Admitting: Radiation Oncology

## 2015-03-31 ENCOUNTER — Encounter: Payer: Medicare Other | Admitting: Physical Therapy

## 2015-04-02 ENCOUNTER — Encounter: Payer: Medicare Other | Admitting: Physical Therapy

## 2015-04-09 ENCOUNTER — Encounter: Payer: Medicare Other | Admitting: Physical Therapy

## 2015-04-13 DIAGNOSIS — C50411 Malignant neoplasm of upper-outer quadrant of right female breast: Secondary | ICD-10-CM | POA: Diagnosis not present

## 2015-04-21 DIAGNOSIS — Z853 Personal history of malignant neoplasm of breast: Secondary | ICD-10-CM | POA: Diagnosis not present

## 2015-04-28 ENCOUNTER — Ambulatory Visit (INDEPENDENT_AMBULATORY_CARE_PROVIDER_SITE_OTHER): Payer: Medicare Other | Admitting: *Deleted

## 2015-04-28 DIAGNOSIS — E782 Mixed hyperlipidemia: Secondary | ICD-10-CM | POA: Diagnosis not present

## 2015-04-28 DIAGNOSIS — R2681 Unsteadiness on feet: Secondary | ICD-10-CM | POA: Diagnosis not present

## 2015-04-28 DIAGNOSIS — I4891 Unspecified atrial fibrillation: Secondary | ICD-10-CM

## 2015-04-28 DIAGNOSIS — M79644 Pain in right finger(s): Secondary | ICD-10-CM | POA: Diagnosis not present

## 2015-04-28 DIAGNOSIS — I10 Essential (primary) hypertension: Secondary | ICD-10-CM | POA: Diagnosis not present

## 2015-04-28 DIAGNOSIS — I509 Heart failure, unspecified: Secondary | ICD-10-CM | POA: Diagnosis not present

## 2015-04-28 DIAGNOSIS — E1121 Type 2 diabetes mellitus with diabetic nephropathy: Secondary | ICD-10-CM | POA: Diagnosis not present

## 2015-04-28 DIAGNOSIS — N183 Chronic kidney disease, stage 3 (moderate): Secondary | ICD-10-CM | POA: Diagnosis not present

## 2015-04-28 DIAGNOSIS — F17200 Nicotine dependence, unspecified, uncomplicated: Secondary | ICD-10-CM | POA: Diagnosis not present

## 2015-04-28 LAB — CBC
HCT: 40 % (ref 36.0–46.0)
Hemoglobin: 12.7 g/dL (ref 12.0–15.0)
MCH: 26.5 pg (ref 26.0–34.0)
MCHC: 31.8 g/dL (ref 30.0–36.0)
MCV: 83.5 fL (ref 78.0–100.0)
MPV: 9 fL (ref 8.6–12.4)
PLATELETS: 208 10*3/uL (ref 150–400)
RBC: 4.79 MIL/uL (ref 3.87–5.11)
RDW: 15.6 % — AB (ref 11.5–15.5)
WBC: 4.8 10*3/uL (ref 4.0–10.5)

## 2015-04-28 LAB — BASIC METABOLIC PANEL
BUN: 21 mg/dL (ref 7–25)
CALCIUM: 9.5 mg/dL (ref 8.6–10.4)
CO2: 25 mmol/L (ref 20–31)
Chloride: 104 mmol/L (ref 98–110)
Creat: 0.88 mg/dL (ref 0.50–0.99)
Glucose, Bld: 91 mg/dL (ref 65–99)
Potassium: 4.5 mmol/L (ref 3.5–5.3)
SODIUM: 138 mmol/L (ref 135–146)

## 2015-04-28 NOTE — Progress Notes (Signed)
Pt was started on Eliquis 5mg  twice a day for AFIB on May 2016.    Reviewed patients medication list.  Pt is not currently on any combined P-gp and strong CYP3A4 inhibitors/inducers (ketoconazole, traconazole, ritonavir, carbamazepine, phenytoin, rifampin, St. John's wort).  Reviewed labs: SCr 0.88, Hgb-12.7, HCT-40, and Weight-73.6kg.  Dose appropriate based on dosing criteria.   Hgb and HCT Within Normal Limits  A full discussion of the nature of anticoagulants has been carried out.  A benefit/risk analysis has been presented to the patient, so that they understand the justification for choosing anticoagulation with Eliquis at this time.  The need for compliance is stressed.  Pt is aware to take the medication twice daily.  Side effects of potential bleeding are discussed, including unusual colored urine or stools, coughing up blood or coffee ground emesis, nose bleeds or serious fall or head trauma.  Discussed signs and symptoms of stroke. The patient should avoid any OTC items containing aspirin or ibuprofen.  Avoid alcohol consumption.   Call if any signs of abnormal bleeding.  Discussed financial obligations and resolved any difficulty in obtaining medication.

## 2015-04-29 ENCOUNTER — Other Ambulatory Visit: Payer: Self-pay | Admitting: Family Medicine

## 2015-04-29 DIAGNOSIS — F17200 Nicotine dependence, unspecified, uncomplicated: Secondary | ICD-10-CM

## 2015-05-04 DIAGNOSIS — R2681 Unsteadiness on feet: Secondary | ICD-10-CM | POA: Diagnosis not present

## 2015-05-06 DIAGNOSIS — M79641 Pain in right hand: Secondary | ICD-10-CM | POA: Diagnosis not present

## 2015-05-11 DIAGNOSIS — R2681 Unsteadiness on feet: Secondary | ICD-10-CM | POA: Diagnosis not present

## 2015-05-18 ENCOUNTER — Other Ambulatory Visit: Payer: Self-pay | Admitting: Hematology and Oncology

## 2015-05-18 ENCOUNTER — Ambulatory Visit
Admission: RE | Admit: 2015-05-18 | Discharge: 2015-05-18 | Disposition: A | Discharge status: Home / Self Care | Payer: Medicare Other | Source: Ambulatory Visit | Attending: Family Medicine | Admitting: Family Medicine

## 2015-05-18 DIAGNOSIS — Z87891 Personal history of nicotine dependence: Secondary | ICD-10-CM | POA: Diagnosis not present

## 2015-05-18 DIAGNOSIS — Z136 Encounter for screening for cardiovascular disorders: Secondary | ICD-10-CM | POA: Diagnosis not present

## 2015-05-18 DIAGNOSIS — F17200 Nicotine dependence, unspecified, uncomplicated: Secondary | ICD-10-CM

## 2015-05-18 DIAGNOSIS — C50411 Malignant neoplasm of upper-outer quadrant of right female breast: Secondary | ICD-10-CM

## 2015-05-19 DIAGNOSIS — R2681 Unsteadiness on feet: Secondary | ICD-10-CM | POA: Diagnosis not present

## 2015-05-19 MED ORDER — LORAZEPAM 0.5 MG PO TABS: 0.5000 mg | ORAL_TABLET | Freq: Three times a day (TID) | ORAL | Status: DC | PRN

## 2015-05-19 NOTE — Addendum Note (Signed)
Addended by: Prentiss Bells on: 05/19/2015 06:19 PM   Modules accepted: Orders

## 2015-05-21 DIAGNOSIS — R2681 Unsteadiness on feet: Secondary | ICD-10-CM | POA: Diagnosis not present

## 2015-05-26 DIAGNOSIS — R2681 Unsteadiness on feet: Secondary | ICD-10-CM | POA: Diagnosis not present

## 2015-05-28 DIAGNOSIS — R2681 Unsteadiness on feet: Secondary | ICD-10-CM | POA: Diagnosis not present

## 2015-06-03 DIAGNOSIS — R2681 Unsteadiness on feet: Secondary | ICD-10-CM | POA: Diagnosis not present

## 2015-06-07 ENCOUNTER — Encounter: Payer: Self-pay | Admitting: Cardiovascular Disease

## 2015-06-07 ENCOUNTER — Ambulatory Visit (INDEPENDENT_AMBULATORY_CARE_PROVIDER_SITE_OTHER): Payer: Medicare Other | Admitting: Cardiovascular Disease

## 2015-06-07 ENCOUNTER — Other Ambulatory Visit: Payer: Self-pay | Admitting: Hematology and Oncology

## 2015-06-07 VITALS — BP 154/94 | HR 64 | Ht 63.0 in | Wt 171.4 lb

## 2015-06-07 DIAGNOSIS — I480 Paroxysmal atrial fibrillation: Secondary | ICD-10-CM | POA: Diagnosis not present

## 2015-06-07 DIAGNOSIS — I10 Essential (primary) hypertension: Secondary | ICD-10-CM

## 2015-06-07 DIAGNOSIS — I5043 Acute on chronic combined systolic (congestive) and diastolic (congestive) heart failure: Secondary | ICD-10-CM

## 2015-06-07 DIAGNOSIS — C50411 Malignant neoplasm of upper-outer quadrant of right female breast: Secondary | ICD-10-CM

## 2015-06-07 DIAGNOSIS — Z79899 Other long term (current) drug therapy: Secondary | ICD-10-CM

## 2015-06-07 DIAGNOSIS — I5022 Chronic systolic (congestive) heart failure: Secondary | ICD-10-CM

## 2015-06-07 MED ORDER — LOSARTAN POTASSIUM 50 MG PO TABS: 50.0000 mg | ORAL_TABLET | Freq: Every day | ORAL | Status: DC

## 2015-06-07 NOTE — Progress Notes (Signed)
Cardiology Office Note   Date:  06/07/2015   ID:  Cheyenne Riggs, Cheyenne Riggs 08/15/1945, MRN [id number]  PCP:  Gennette Pac, MD  Cardiologist:   Thayer Headings, MD   Chief Complaint  Patient presents with  . Follow-up    CHF   1. 1. Acute combined systolic and diastolic CHF:  2. Essential HTN:   3. Breast cancer :  4. Atrial fibrillation  CHADS2VASC = 4   ( female, age > 53, CHF, HTN)     Sep 24, 2014 Cheyenne Riggs is a 70 y.o. female who presents for chronic systolic CHF  She has had many ER visits. Was hospitalized with atrial fib. 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  Looks like embolic disease  . Has developed a pressure sore on left heal . Has seen Gae Gallop who wants to do a peripheral angiogram next week   Has bilateral foot swelling , progresses during the day, improves, / resolves overnight when she is in bed. She was started on Lasix ( but this also eventually resulted in a hospital admission for dehydration   December 07, 2014:  Cheyenne Riggs has had a toe amputation since I last saw her . She is now back on Eliquis .  Is feeling well. Walking with the walker ,  Working with PT .  BP is normal at home.  Is a bit higher here - she is having pain in her foot  Jan. 23, 2017: I saw her 6 months go . No CP or dyspnea.   Has neuropathy in both feet.   Is walking better.  No longer needs a walker or cane.  Is still in PT.    Past Medical History  Diagnosis Date  . Back pain   . H/O bladder problems   . Cholelithiasis   . Depression   . High blood pressure   . Hypercholesteremia   . Gum disease   . Wears glasses   . Wears dentures     top  . Cancer ([place])   . Breast cancer ([place])   . History of kidney stones     leaks  . Complication of anesthesia     bp goes up- now on Lopressor  . Dysrhythmia 08/2014    Afib- episode- was cardioverted in ED [facility]  . CHF (congestive heart failure) ([place])   . Pneumonia 05/2014    . History of kidney stones     paseed one, still has 2  . Anxiety     Panic attacks - when she was on Chanix- still has anxiety  . History of blood transfusion   . Shingles   . Thrombocytopenia ([place]) 4/16  . Rhabdomyolysis 08/2014  . SVT (supraventricular tachycardia) ([place]) 4/16  . ARF (acute renal failure) ([place]) 4/16  . Radiation 12/23/14-02/08/15    right breast 45 gray boosted to 16 gray    Past Surgical History  Procedure Laterality Date  . Colon biopsy    . Mouth surgery      implants - denture attatchs to it  . Spine surgery    . Colonoscopy    . Tubal ligation    . Lithotripsy    . Rhinoplasty    . Back surgery  1989    lumb lam  . Breast biopsy Right 02/05/2014    invasive ductal cncer  . Abdominal hysterectomy  1982  . Cholecystectomy  2011  lap choli  . Radioactive seed guided mastectomy with axillary sentinel lymph node biopsy Right 02/27/2014    Procedure: RADIOACTIVE SEED GUIDED RIGHT PARTIAL MASTECTOMY WITH AXILLARY SENTINEL LYMPH NODE BIOPSY;  Surgeon: Stark Klein, MD;  Location: [facility];  Service: General;  Laterality: Right;  . Portacath placement N/A 02/27/2014    Procedure: INSERTION PORT-A-CATH;  Surgeon: Stark Klein, MD;  Location: [facility];  Service: General;  Laterality: N/A;  . Peripheral vascular catheterization N/A 09/28/2014    Procedure: Abdominal Aortogram;  Surgeon: Angelia Mould, MD;  Location: [facility];  Service: Cardiovascular;  Laterality: N/A;  . Amputation Left 12/01/2014    Procedure: AMPUTATION Left GREAT TOE;  Surgeon: Angelia Mould, MD;  Location: [facility];  Service: Vascular;  Laterality: Left;  . Toe amputation    . Port-a-cath removal N/A 03/09/2015    Procedure: REMOVAL PORT-A-CATH;  Surgeon: Stark Klein, MD;  Location: [facility];  Service: General;  Laterality: N/A;     Current Outpatient Prescriptions  Medication Sig Dispense Refill  . apixaban (ELIQUIS) 5 MG TABS tablet  Take 1 tablet (5 mg total) by mouth 2 (two) times daily. 60 tablet 11  . B Complex Vitamins (VITAMIN B COMPLEX PO) Take 1 tablet by mouth 3 (three) times daily.    Marland Kitchen FLUoxetine (PROZAC) 10 MG capsule Take 1 capsule (10 mg total) by mouth daily. (Patient taking differently: Take 10 mg by mouth 2 (two) times daily. ) 30 capsule 1  . furosemide (LASIX) 40 MG tablet Take 1 tablet (40 mg total) by mouth daily. (Patient taking differently: Take 20 mg by mouth daily as needed (weight gain). ) 30 tablet 0  . gabapentin (NEURONTIN) 100 MG capsule TAKE (1) TABLET BY MOUTH TWICE A DAY 60 capsule 2  . LORazepam (ATIVAN) 0.5 MG tablet Take 1 tablet (0.5 mg total) by mouth every 8 (eight) hours as needed for anxiety. 30 tablet 5  . Magnesium 100 MG TABS Take 1 tablet by mouth 3 (three) times daily.    . metoprolol tartrate (LOPRESSOR) 25 MG tablet Take 1 tablet (25 mg total) by mouth 2 (two) times daily. 60 tablet 5  . oxyCODONE-acetaminophen (PERCOCET/ROXICET) 5-325 MG tablet Take 1-2 tablets by mouth every 4 (four) hours as needed for moderate pain or severe pain. 30 tablet 0  . potassium chloride (MICRO-K) 10 MEQ CR capsule Take 1 capsule (10 mEq total) by mouth 2 (two) times daily. 180 capsule 3   No current facility-administered medications for this visit.    Allergies:   Ambien; Ciprofloxacin; Demerol; Lidocaine; Other; Septra; Codeine; Novocain; and Vancomycin    Social History:  The patient  reports that she quit smoking about 12 months ago. She does not have any smokeless tobacco history on file. She reports that she does not drink alcohol or use illicit drugs.   Family History:  The patient's family history includes Cancer in her father; Cancer - Other in her father; Heart failure in her sister; Hyperlipidemia in her mother; Hypertension in her brother, mother, and sister; Stroke in her mother. There is no history of Heart attack.    ROS:  Please see the history of present illness.    Review of  Systems: Constitutional:  admits to poor appetite   HEENT: denies photophobia, eye pain, redness, hearing loss, ear pain, congestion, sore throat, rhinorrhea, sneezing, neck pain, neck stiffness and tinnitus.  Respiratory: admits to SOB, DOE, cough, chest tightness, and wheezing.  Cardiovascular:  admits to   leg swelling.  Gastrointestinal: admits to nausea, vomiting,   Genitourinary: denies dysuria, urgency, frequency, hematuria, flank pain and difficulty urinating.  Musculoskeletal: denies  myalgias, back pain, joint swelling, arthralgias and gait problem.   Skin: denies pallor, rash and wound.  Neurological: denies dizziness, seizures, syncope, weakness, light-headedness, numbness and headaches.   Hematological: denies adenopathy, easy bruising, personal or family bleeding history.  Psychiatric/ Behavioral: denies suicidal ideation, mood changes, confusion, nervousness, sleep disturbance and agitation.       All other systems are reviewed and negative.    PHYSICAL EXAM: VS:  BP 154/94 mmHg  Pulse 64  Ht 5\' 3"  (1.6 m)  Wt 171 lb 6.4 oz (77.747 kg)  BMI 30.37 kg/m2  SpO2 94% , BMI Body mass index is 30.37 kg/(m^2). GEN: Well nourished, well developed, in no acute distress, wearing a cap  HEENT: normal Neck: no JVD, carotid bruits, or masses Cardiac: RRR; no murmurs, rubs, or gallops, 2+ edema  Respiratory:  clear to auscultation bilaterally, normal work of breathing GI: soft, nontender, nondistended, + BS MS:  Left foot is bandaged.  In a boot.  Skin: warm and dry, no rash Neuro:  Strength and sensation are intact Psych: normal   EKG:  EKG is ordered today. The ekg ordered today demonstrates :  NSR at 73.  TWI in inferiolateral leads    Recent Labs: 08/17/2014: B Natriuretic Peptide 1980.9*; TSH 1.792 08/26/2014: Magnesium 1.8 12/01/2014: ALT 11* 04/28/2015: BUN 21; Creat 0.88; Hemoglobin 12.7; Platelets 208; Potassium 4.5; Sodium 138    Lipid Panel No results found  for: CHOL, TRIG, HDL, CHOLHDL, VLDL, LDLCALC, LDLDIRECT    Wt Readings from Last 3 Encounters:  06/07/15 171 lb 6.4 oz (77.747 kg)  03/18/15 162 lb 1.6 oz (73.528 kg)  03/12/15 164 lb 1.6 oz (74.435 kg)      Other studies Reviewed: Additional studies/ records that were reviewed today include: . Review of the above records demonstrates:    ASSESSMENT AND PLAN:  1. Acute combined systolic and diastolic CHF: She is off the  Adriamycin. Her echo in Oct. Showed the initial stages of CHf with a reduced global longitudinal strain of -15.4%.   most recent echo shows EF 35%. She is on metoprolol She's been on an ACE inhibitor in the past and it was stopped for some reason-presumably hypotension or renal insufficiency. Her kidney function is now normal and her blood pressure is in fact a little elevated.Burnis Medin restart losartan 50 mg a day. I'll check a basic medical profile in 3 weeks. Will continue to follow and adjust BP meds as needed.   2. Essential HTN: She was on Diovan / HCTZ for years but stopped it when her BP dropped ( after she started chemotherapy). We'll restart losartan.  I do like the valsartan/HCTZ accommodation and we will restart that once it becomes cheaper in [place].  3. Breast cancer : Plans per Oncology.    She is now off chemotherapy.  4. Atrial fibrillation:  .  On  Eliquis 5 bid        Current medicines are reviewed at length with the patient today.  The patient does not have concerns regarding medicines.  The following changes have been made:  no change  Labs/ tests ordered today include:  No orders of the defined types were placed in this encounter.     Disposition:   FU with 6 months      Emmett Bracknell, Wonda Cheng, MD  06/07/2015 12:09 PM    [facility] [address], [place], [place]  [address] Phone: [phone]; Fax: [phone]

## 2015-06-07 NOTE — Patient Instructions (Signed)
Medication Instructions:  Please start Losartan 50 mg a day. Continue all other medications as listed.  Labwork: Please have blood work in 3 weeks (BMP)  Follow-Up: Follow up in 3 months with Dr Acie Fredrickson.  If you need a refill on your cardiac medications before your next appointment, please call your pharmacy.  Thank you for choosing [facility]!!

## 2015-06-08 DIAGNOSIS — R2681 Unsteadiness on feet: Secondary | ICD-10-CM | POA: Diagnosis not present

## 2015-06-23 DIAGNOSIS — H40013 Open angle with borderline findings, low risk, bilateral: Secondary | ICD-10-CM | POA: Diagnosis not present

## 2015-06-23 DIAGNOSIS — H3509 Other intraretinal microvascular abnormalities: Secondary | ICD-10-CM | POA: Diagnosis not present

## 2015-06-23 DIAGNOSIS — H1859 Other hereditary corneal dystrophies: Secondary | ICD-10-CM | POA: Diagnosis not present

## 2015-06-23 DIAGNOSIS — H35033 Hypertensive retinopathy, bilateral: Secondary | ICD-10-CM | POA: Diagnosis not present

## 2015-06-28 ENCOUNTER — Other Ambulatory Visit (INDEPENDENT_AMBULATORY_CARE_PROVIDER_SITE_OTHER): Payer: Medicare Other | Admitting: *Deleted

## 2015-06-28 DIAGNOSIS — Z79899 Other long term (current) drug therapy: Secondary | ICD-10-CM

## 2015-06-28 DIAGNOSIS — I5022 Chronic systolic (congestive) heart failure: Secondary | ICD-10-CM

## 2015-06-28 DIAGNOSIS — I509 Heart failure, unspecified: Secondary | ICD-10-CM | POA: Diagnosis not present

## 2015-06-28 LAB — BASIC METABOLIC PANEL
BUN: 13 mg/dL (ref 7–25)
CALCIUM: 9.6 mg/dL (ref 8.6–10.4)
CO2: 23 mmol/L (ref 20–31)
CREATININE: 0.95 mg/dL (ref 0.50–0.99)
Chloride: 102 mmol/L (ref 98–110)
GLUCOSE: 113 mg/dL — AB (ref 65–99)
Potassium: 4.1 mmol/L (ref 3.5–5.3)
Sodium: 137 mmol/L (ref 135–146)

## 2015-07-08 ENCOUNTER — Telehealth: Payer: Self-pay | Admitting: Cardiovascular Disease

## 2015-07-08 NOTE — Telephone Encounter (Signed)
New message      Pt c/o medication issue:  1. Name of Medication: losartan/potassium 2. How are you currently taking this medication (dosage and times per day)? 50mg  daily  3. Are you having a reaction (difficulty breathing--STAT)? no 4. What is your medication issue?  Pt started medication on 06-30-15.  She c/o nausea and dizzy every since she started taking medication--could this be a side effect?

## 2015-07-08 NOTE — Telephone Encounter (Signed)
Spoke with patient who states she has been experiencing dizziness and nausea since starting losartan and potassium as directed at last office visit 1/23 with Dr. Acie Fredrickson.  She states systolic BP has been [id number] to 123456 mmHg and diastolic has been 0000000 to 80's mmHg.  She states her pulse has been in the range of 70 bpm.  I advised that the losartan is important for heart function and Dr. Acie Fredrickson would like her to continue to take it if possible.  I suggested she take the medication at a different time of day and she states she will try taking it after lunch to see if this helps with her symptoms.  She denies that she has recently started any other medications.  I advised her to call back in a few days to let me know how she is doing with taking the medication later in the day.  She verbalized understanding and agreement and thanked me for the call.

## 2015-07-12 ENCOUNTER — Other Ambulatory Visit: Payer: Self-pay | Admitting: Cardiovascular Disease

## 2015-07-19 ENCOUNTER — Telehealth: Payer: Self-pay

## 2015-07-19 NOTE — Telephone Encounter (Signed)
Pt reports infection in her tooth.  She is afraid of getting C. Diff again and wants to know if she should take flagyl prophylactically.  Writer advised pt that MD advises against this due to concern of developins resistant organisms.  Pt voiced understanding.

## 2015-08-10 ENCOUNTER — Encounter: Payer: Self-pay | Admitting: Hematology and Oncology

## 2015-08-10 ENCOUNTER — Ambulatory Visit (HOSPITAL_BASED_OUTPATIENT_CLINIC_OR_DEPARTMENT_OTHER): Payer: Medicare Other | Admitting: Hematology and Oncology

## 2015-08-10 VITALS — BP 153/86 | HR 68 | Temp 97.7°F | Resp 18 | Ht 63.0 in | Wt 165.4 lb

## 2015-08-10 DIAGNOSIS — C50411 Malignant neoplasm of upper-outer quadrant of right female breast: Secondary | ICD-10-CM

## 2015-08-10 DIAGNOSIS — Z853 Personal history of malignant neoplasm of breast: Secondary | ICD-10-CM

## 2015-08-10 DIAGNOSIS — G620 Drug-induced polyneuropathy: Secondary | ICD-10-CM | POA: Diagnosis not present

## 2015-08-10 NOTE — Assessment & Plan Note (Signed)
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 status post adjuvant chemotherapy with dose dense Adriamycin and Cytoxan 4 starting 03/30/2014 followed by Abraxane weekly 7 discontinued 07/27/2014 due to severe toxicities and hospitalizations Chemotherapy toxicities include C. difficile, neutropenic fever, dental infection, SIRS, nausea, diarrhea, fatigue, anemia, hypokalemia, CHF) S/P XRT completed 02/08/2015  Recommendation: since she is ER/PR negative, there is no role of antiestrogen therapy. Congestive heart failure Atrial fibrillation Lower extremity embolism causing gangrene: On anticoagulation, continue with this indefinitely. Multiple heart and lung issues:  Port removed.  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 She takes occasional Percocet.  Breast Cancer Surveillance: 1. Breast exam 08/10/15: Normal 2. Mammogram [facility] 04/21/15 scattered calcifications in both breast six-month follow-up mammogram was recommended Postsurgical changes. Breast Density Category C. I recommended that she get 3-D mammograms for surveillance. Discussed the differences between different breast density categories.    Return to clinic in 6 months for follow-up

## 2015-08-10 NOTE — Progress Notes (Signed)
Patient Care Team: Hulan Fess, MD as PCP - General (Family Medicine) Nicholas Lose, MD as Consulting Physician (Hematology and Oncology) Stark Klein, MD as Consulting Physician (General Surgery) Thea Silversmith, MD as Consulting Physician (Radiation Oncology) Sylvan Cheese, NP as Nurse Practitioner (Hematology and Oncology)  DIAGNOSIS: Breast cancer of upper-outer quadrant of right female breast [facility])   Staging form: Breast, AJCC 7th Edition     Clinical: Stage IA (T1c, N0, cM0) - Signed by Thea Silversmith, MD on 02/18/2014     Pathologic: Stage IIA (T2, N0, cM0) - Signed by Rulon Eisenmenger, MD on 03/12/2014  SUMMARY OF ONCOLOGIC HISTORY:   Breast cancer of upper-outer quadrant of right female breast ([place])   01/27/2014 Mammogram Right breast: possible mass measuring 1.6 cm at 11:00, 9 cm from nipple.  Additional imaging needed.   01/30/2014 Breast US Right breast: New irregular equal density mass with indistinct margin at 11:00, middle depth, 9 cm from nipple, measruing 1.8 cm   02/05/2014 Initial Biopsy Right breast core needle bx: Invasive ductal carcinoma, grade 2-3, ER- (0%), PR- (0%), HER2/neu negative (ratio 1.15), Ki67 81%   02/06/2014 Clinical Stage Stage IA: T1c N0   02/27/2014 Definitive Surgery Right breast lumpectomy/SLNB ([place]): Invasive ductal carcinoma grade 3, 2.5 cm, DCIS intermediate grade, margins negative, 2 SLN negative, ER/PR HER-2 negative   02/27/2014 Pathologic Stage Stage IIA: T2 N0   03/30/2014 - 07/27/2014 Chemotherapy Adjuvant chemotherapy with dose dense doxorubicin and cyclophosphamide x 4 cycles followed by nab-paclitaxel weekly x7/12 (stopped early due to hospitalizations and recurrent C. difficile)   05/17/2014 - 05/26/2014 Hospital Admission Neutropenic fever secondary to dental infection which led to C. difficile enterocolitis, atypical pneumonia and SIRS   08/09/2014 - 08/10/2014 Hospital Admission Hospitalization for nausea vomiting and dehydration  related to diarrhea from C. 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 Right breast boost / 16 Gray over 8 fractions. Total dose: 61 Gy   03/12/2015 Survivorship Survivorship visit completed and given to patient.    CHIEF COMPLIANT: follow-up of breast cancer, surveillance  INTERVAL HISTORY: Cheyenne Riggs is a 70 year old with above-mentioned history of right breast cancer who is currently on surveillance. She has had a very complex course as outlined above. She appears to be doing fairly well because of concerns for her safety, she decided to move into an assisted living facility near [place]. She is excited about moving in there because they have exercise and fitness facilities and they can help her get to appointments easily.  REVIEW OF SYSTEMS:   Constitutional: Denies fevers, chills or abnormal weight loss Eyes: Denies blurriness of vision Ears, nose, mouth, throat, and face: Denies mucositis or sore throat Respiratory: Denies cough, dyspnea or wheezes Cardiovascular: Denies palpitation, chest discomfort Gastrointestinal:  Denies nausea, heartburn or change in bowel habits Skin: Denies abnormal skin rashes Lymphatics: Denies new lymphadenopathy or easy bruising Neurological:Denies numbness, tingling or new weaknesses Behavioral/Psych: Mood is stable, no new changes  Extremities: No lower extremity edema Breast:  denies any pain or lumps or nodules in either breasts All other systems were reviewed with the patient and are negative.  I have reviewed the past medical history, past surgical history, social history and family history with the patient and they are unchanged from  previous note.  ALLERGIES:  is allergic to ambien;  ciprofloxacin; demerol; lidocaine; other; septra; codeine; novocain; and vancomycin.  MEDICATIONS:  Current Outpatient Prescriptions  Medication Sig Dispense Refill  . apixaban (ELIQUIS) 5 MG TABS tablet Take 1 tablet (5 mg total) by mouth 2 (two) times daily. 60 tablet 11  . B Complex Vitamins (VITAMIN B COMPLEX PO) Take 1 tablet by mouth 3 (three) times daily.    Marland Kitchen FLUoxetine (PROZAC) 10 MG capsule Take 1 capsule (10 mg total) by mouth daily. (Patient taking differently: Take 10 mg by mouth 2 (two) times daily. ) 30 capsule 1  . furosemide (LASIX) 40 MG tablet Take 1 tablet (40 mg total) by mouth daily. (Patient taking differently: Take 20 mg by mouth daily as needed (weight gain). ) 30 tablet 0  . gabapentin (NEURONTIN) 100 MG capsule TAKE (1) TABLET BY MOUTH TWICE A DAY 60 capsule 2  . LORazepam (ATIVAN) 0.5 MG tablet Take 1 tablet (0.5 mg total) by mouth every 8 (eight) hours as needed for anxiety. 30 tablet 5  . losartan (COZAAR) 50 MG tablet Take 1 tablet (50 mg total) by mouth daily. 30 tablet 6  . Magnesium 100 MG TABS Take 1 tablet by mouth 3 (three) times daily.    . metoprolol tartrate (LOPRESSOR) 25 MG tablet TAKE 1 TABLET (25 MG TOTAL) BY MOUTH 2 (TWO) TIMES DAILY. 60 tablet 10  . oxyCODONE-acetaminophen (PERCOCET/ROXICET) 5-325 MG tablet Take 1-2 tablets by mouth every 4 (four) hours as needed for moderate pain or severe pain. 30 tablet 0  . potassium chloride (MICRO-K) 10 MEQ CR capsule Take 1 capsule (10 mEq total) by mouth 2 (two) times daily. 180 capsule 3   No current facility-administered medications for this visit.    PHYSICAL EXAMINATION: ECOG PERFORMANCE STATUS: 1 - Symptomatic but completely ambulatory  Filed Vitals:   08/10/15 0920  BP: 153/86  Pulse: 68  Temp: 97.7 F (36.5 C)  Resp: 18   Filed Weights   08/10/15 0920  Weight: 165 lb 6.4 oz (75.025 kg)    GENERAL:alert, no distress and comfortable SKIN: skin color, texture, turgor are normal, no rashes  or significant lesions EYES: normal, Conjunctiva are pink and non-injected, sclera clear OROPHARYNX:no exudate, no erythema and lips, buccal mucosa, and tongue normal  NECK: supple, thyroid normal size, non-tender, without nodularity LYMPH:  no palpable lymphadenopathy in the cervical, axillary or inguinal LUNGS: clear to auscultation and percussion with normal breathing effort HEART: regular rate & rhythm and soft ejection systolic murmur in the mitral area ABDOMEN:abdomen soft, non-tender and normal bowel sounds MUSCULOSKELETAL:no cyanosis of digits and no clubbing  NEURO: alert & oriented x 3 with fluent speech, no focal motor/sensory deficits EXTREMITIES: No lower extremity edema BREAST: No palpable masses or nodules in either right or left breasts. No palpable axillary supraclavicular or infraclavicular adenopathy no breast tenderness or nipple discharge. (exam performed in the presence of a chaperone)  LABORATORY DATA:  I have reviewed the data as listed   Chemistry      Component Value Date/Time   NA 137 06/28/2015 0948   NA 141 11/10/2014 1321   K 4.1 06/28/2015 0948   K 4.0 11/10/2014 1321   CL 102 06/28/2015 0948   CO2 23 06/28/2015 0948   CO2 29 11/10/2014 1321   BUN 13 06/28/2015 0948   BUN 13.8 11/10/2014 1321   CREATININE 0.95 06/28/2015 0948   CREATININE 1.11* 03/04/2015 1025   CREATININE 1.1 11/10/2014 1321  Component Value Date/Time   CALCIUM 9.6 06/28/2015 0948   CALCIUM 9.5 11/10/2014 1321   ALKPHOS 81 12/01/2014 1040   ALKPHOS 88 11/10/2014 1321   AST 17 12/01/2014 1040   AST 23 11/10/2014 1321   ALT 11* 12/01/2014 1040   ALT 10 11/10/2014 1321   BILITOT 1.2 12/01/2014 1040   BILITOT 0.97 11/10/2014 1321       Lab Results  Component Value Date   WBC 4.8 04/28/2015   HGB 12.7 04/28/2015   HCT 40.0 04/28/2015   MCV 83.5 04/28/2015   PLT 208 04/28/2015   NEUTROABS 3.2 11/10/2014     ASSESSMENT & PLAN:  Breast cancer of upper-outer  quadrant of right female 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 status post adjuvant chemotherapy with dose dense Adriamycin and Cytoxan 4 starting 03/30/2014 followed by Abraxane weekly 7 discontinued 07/27/2014 due to severe toxicities and hospitalizations Chemotherapy toxicities include C. difficile, neutropenic fever, dental infection, SIRS, nausea, diarrhea, fatigue, anemia, hypokalemia, CHF) S/P XRT completed 02/08/2015  Recommendation: since she is ER/PR negative, there is no role of antiestrogen therapy. Congestive heart failure Atrial fibrillation Lower extremity embolism causing gangrene: On anticoagulation, continue with this indefinitely. Multiple heart and lung issues: she appears to be doing reasonably well Port removed.  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 She takes occasional Percocet.  Breast Cancer Surveillance: 1. Breast exam 08/10/15: Normal 2. Mammogram [facility] 04/21/15 scattered calcifications in both breast six-month follow-up mammogram was recommended Postsurgical changes. Breast Density Category C. I recommended that she get 3-D mammograms for surveillance. Discussed the differences between different breast density categories.   Patient plans to move to [facility] area to live in an assisted living facility. She will find a new oncologist closer to her new residential area to continue her surveillance.  No orders of the defined types were placed in this encounter.   The patient has a good understanding of the overall plan. she agrees with it. she will call with any problems that may develop before the next visit here.   Rulon Eisenmenger, MD 08/10/2015

## 2015-08-10 NOTE — Progress Notes (Signed)
Unable to get in to exam room prior to MD.  No assessment performed.  

## 2015-08-12 ENCOUNTER — Telehealth: Payer: Self-pay | Admitting: *Deleted

## 2015-08-12 MED ORDER — FUROSEMIDE 20 MG PO TABS: 20.0000 mg | ORAL_TABLET | Freq: Every day | ORAL | Status: AC | PRN

## 2015-08-12 NOTE — Telephone Encounter (Signed)
Pt called requesting a refill for her furosemide 40 mg. This Rx is note written by Dr. Acie Fredrickson, but by her previous oncologist. The Pt stated she has no more refills and the pharmacy needs authorization and a new Rx.

## 2015-08-12 NOTE — Telephone Encounter (Signed)
Rx for 20 mg furosemide to be taken daily as needed sent to patient's pharmacy

## 2015-09-02 ENCOUNTER — Other Ambulatory Visit: Payer: Self-pay | Admitting: *Deleted

## 2015-09-02 MED ORDER — LOSARTAN POTASSIUM 50 MG PO TABS: 50.0000 mg | ORAL_TABLET | Freq: Every day | ORAL | Status: DC

## 2015-09-06 ENCOUNTER — Encounter: Payer: Self-pay | Admitting: Cardiovascular Disease

## 2015-09-06 ENCOUNTER — Ambulatory Visit (INDEPENDENT_AMBULATORY_CARE_PROVIDER_SITE_OTHER): Payer: Medicare Other | Admitting: Cardiovascular Disease

## 2015-09-06 VITALS — BP 170/88 | HR 64 | Ht 63.0 in | Wt 166.8 lb

## 2015-09-06 DIAGNOSIS — I10 Essential (primary) hypertension: Secondary | ICD-10-CM

## 2015-09-06 DIAGNOSIS — C50411 Malignant neoplasm of upper-outer quadrant of right female breast: Secondary | ICD-10-CM

## 2015-09-06 DIAGNOSIS — I5043 Acute on chronic combined systolic (congestive) and diastolic (congestive) heart failure: Secondary | ICD-10-CM | POA: Diagnosis not present

## 2015-09-06 DIAGNOSIS — I480 Paroxysmal atrial fibrillation: Secondary | ICD-10-CM

## 2015-09-06 MED ORDER — CARVEDILOL 25 MG PO TABS: 25.0000 mg | ORAL_TABLET | Freq: Two times a day (BID) | ORAL | Status: DC

## 2015-09-06 MED ORDER — POTASSIUM CHLORIDE ER 10 MEQ PO CPCR: 10.0000 meq | ORAL_CAPSULE | Freq: Two times a day (BID) | ORAL | Status: DC | PRN

## 2015-09-06 MED ORDER — VALSARTAN 160 MG PO TABS: 160.0000 mg | ORAL_TABLET | Freq: Every day | ORAL | Status: AC

## 2015-09-06 NOTE — Patient Instructions (Addendum)
Medication Instructions:  STOP Metoprolol START Carvedilol (Coreg) 25 mg twice daily STOP Losartan START Valsartan (Diovan) 160 mg once daily CHANGE Kdur (potassium) to 10 meq twice daily as needed on the same days you take Furosemide (Lasix)  Labwork: None Ordered   Testing/Procedures: None Ordered   Follow-Up: Your physician recommends that you schedule a follow-up appointment in: as needed with Dr. Acie Fredrickson.    If you need a refill on your cardiac medications before your next appointment, please call your pharmacy.   Thank you for choosing [facility]! Christen Bame, RN [phone]

## 2015-09-06 NOTE — Progress Notes (Signed)
Cardiology Office Note   Date:  09/06/2015   ID:  [place], [place] 03-28-1946, MRN [id number]  PCP:  Gennette Pac, MD  Cardiologist:   Thayer Headings, MD   Chief Complaint  Patient presents with  . Follow-up    CHF   1. 1. Acute combined systolic and diastolic CHF:  2. Essential HTN:   3. Breast cancer :  4. Atrial fibrillation  CHADS2VASC = 4   ( female, age > 35, CHF, HTN)     Sep 24, 2014 Cheyenne Riggs is a 70 y.o. female who presents for chronic systolic CHF  She has had many ER visits. Was hospitalized with atrial fib. 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  Looks like embolic disease  . Has developed a pressure sore on left heal . Has seen Gae Gallop who wants to do a peripheral angiogram next week   Has bilateral foot swelling , progresses during the day, improves, / resolves overnight when she is in bed. She was started on Lasix ( but this also eventually resulted in a hospital admission for dehydration   December 07, 2014:  Che[place]enne Rig[place] has had a toe amputation since I last saw her . She is now back on Eliquis .  Is feeling well. Walking with the walker ,  Working with PT .  BP is normal at home.  Is a bit higher here - she is having pain in her foot  Jan. 23, 2017: I saw her 6 months go . No CP or dyspnea.   Has neuropathy in both feet.   Is walking better.  No longer needs a walker or cane.  Is still in PT.    September 06, 2015 Cheyenne Riggs is seen today for follow up  BP is very high today  Has lost her BP cuff so she does not know if its been like this  Avoiding salt.   Occasionally eats [place] [employer].    Past Medical History  Diagnosis Date  . Back pain   . H/O bladder problems   . Cholelithiasis   . Depression   . High blood pressure   . Hypercholesteremia   . Gum disease   . Wears glasses   . Wears dentures     top  . Cancer ([place])   . Breast cancer ([place])   . History of kidney stones     leaks    . Complication of anesthesia     bp goes up- now on Lopressor  . Dysrhythmia 08/2014    Afib- episode- was cardioverted in ED [facility]  . CHF (congestive heart failure) ([place])   . Pneumonia 05/2014  . History of kidney stones     paseed one, still has 2  . Anxiety     Panic attacks - when she was on Chanix- still has anxiety  . History of blood transfusion   . Shingles   . Thrombocytopenia ([place]) 4/16  . Rhabdomyolysis 08/2014  . SVT (supraventricular tachycardia) ([place]) 4/16  . ARF (acute renal failure) ([place]) 4/16  . Radiation 12/23/14-02/08/15    right breast 45 gray boosted to 16 gray    Past Surgical History  Procedure Laterality Date  . Colon biopsy    . Mouth surgery      implants - denture attatchs to it  . Spine surgery    . Colonoscopy    . Tubal ligation    .  Lithotripsy    . Rhinoplasty    . Back surgery  1989    lumb lam  . Breast biopsy Right 02/05/2014    invasive ductal cncer  . Abdominal hysterectomy  1982  . Cholecystectomy  2011    lap choli  . Radioactive seed guided mastectomy with axillary sentinel lymph node biopsy Right 02/27/2014    Procedure: RADIOACTIVE SEED GUIDED RIGHT PARTIAL MASTECTOMY WITH AXILLARY SENTINEL LYMPH NODE BIOPSY;  Surgeon: Stark Klein, MD;  Location: [facility];  Service: General;  Laterality: Right;  . Portacath placement N/A 02/27/2014    Procedure: INSERTION PORT-A-CATH;  Surgeon: Stark Klein, MD;  Location: [place] [facility];  Service: General;  Laterality: N/A;  . Peripheral vascular catheterization N/A 09/28/2014    Procedure: Abdominal Aortogram;  Surgeon: Angelia Mould, MD;  Location: [place] [facility] [facility];  Service: Cardiovascular;  Laterality: N/A;  . Amputation Left 12/01/2014    Procedure: AMPUTATION Left GREAT TOE;  Surgeon: Angelia Mould, MD;  Location: [facility];  Service: Vascular;  Laterality: Left;  . Toe amputation    . Port-a-cath removal N/A 03/09/2015    Procedure: REMOVAL PORT-A-CATH;   Surgeon: Stark Klein, MD;  Location: [facility];  Service: General;  Laterality: N/A;     Current Outpatient Prescriptions  Medication Sig Dispense Refill  . apixaban (ELIQUIS) 5 MG TABS tablet Take 1 tablet (5 mg total) by mouth 2 (two) times daily. 60 tablet 11  . B Complex Vitamins (VITAMIN B COMPLEX PO) Take 1 tablet by mouth 3 (three) times daily.    Marland Kitchen FLUoxetine (PROZAC) 10 MG capsule Take 1 capsule (10 mg total) by mouth daily. (Patient taking differently: Take 20 mg by mouth 2 (two) times daily. ) 30 capsule 1  . furosemide (LASIX) 20 MG tablet Take 1 tablet (20 mg total) by mouth daily as needed (weight gain). 90 tablet 3  . gabapentin (NEURONTIN) 100 MG capsule TAKE (1) TABLET BY MOUTH TWICE A DAY 60 capsule 2  . LORazepam (ATIVAN) 0.5 MG tablet Take 1 tablet (0.5 mg total) by mouth every 8 (eight) hours as needed for anxiety. 30 tablet 5  . losartan (COZAAR) 50 MG tablet Take 1 tablet (50 mg total) by mouth daily. 90 tablet 2  . Magnesium 100 MG TABS Take 1 tablet by mouth 3 (three) times daily.    . metoprolol tartrate (LOPRESSOR) 25 MG tablet TAKE 1 TABLET (25 MG TOTAL) BY MOUTH 2 (TWO) TIMES DAILY. 60 tablet 10  . potassium chloride (MICRO-K) 10 MEQ CR capsule Take 1 capsule (10 mEq total) by mouth 2 (two) times daily. 180 capsule 3  . oxyCODONE-acetaminophen (PERCOCET/ROXICET) 5-325 MG tablet Take 1-2 tablets by mouth every 4 (four) hours as needed for moderate pain or severe pain. (Patient not taking: Reported on 09/06/2015) 30 tablet 0   No current facility-administered medications for this visit.    Allergies:   Ambien; Ciprofloxacin; Demerol; Lidocaine; Other; Septra; Codeine; Novocain; and Vancomycin    Social History:  The patient  reports that she quit smoking about 15 months ago. She does not have any smokeless tobacco history on file. She reports that she does not drink alcohol or use illicit drugs.   Family History:  The patient's family history includes Cancer in her  father; Cancer - Other in her father; Heart failure in her sister; Hyperlipidemia in her mother; Hypertension in her brother, mother, and sister; Stroke in her mother. There is no history of Heart attack.  ROS:  Please see the history of present illness.    Review of Systems: Constitutional:  admits to poor appetite   HEENT: denies photophobia, eye pain, redness, hearing loss, ear pain, congestion, sore throat, rhinorrhea, sneezing, neck pain, neck stiffness and tinnitus.  Respiratory: admits to SOB, DOE, cough, chest tightness, and wheezing.  Cardiovascular: admits to   leg swelling.  Gastrointestinal: admits to nausea, vomiting,   Genitourinary: denies dysuria, urgency, frequency, hematuria, flank pain and difficulty urinating.  Musculoskeletal: denies  myalgias, back pain, joint swelling, arthralgias and gait problem.   Skin: denies pallor, rash and wound.  Neurological: denies dizziness, seizures, syncope, weakness, light-headedness, numbness and headaches.   Hematological: denies adenopathy, easy bruising, personal or family bleeding history.  Psychiatric/ Behavioral: denies suicidal ideation, mood changes, confusion, nervousness, sleep disturbance and agitation.       All other systems are reviewed and negative.    PHYSICAL EXAM: VS:  BP 190/104 mmHg  Pulse 64  Ht 5\' 3"  (1.6 m)  Wt 166 lb 12.8 oz (75.66 kg)  BMI 29.55 kg/m2 , BMI Body mass index is 29.55 kg/(m^2). GEN: Well nourished, well developed, in no acute distress, wearing a cap  HEENT: normal Neck: no JVD, carotid bruits, or masses Cardiac: RRR; no murmurs, rubs, or gallops, 2+ edema  Respiratory:  clear to auscultation bilaterally, normal work of breathing GI: soft, nontender, nondistended, + BS MS:  Left foot is bandaged.  In a boot.  Skin: warm and dry, no rash Neuro:  Strength and sensation are intact Psych: normal   EKG:  EKG is ordered today. The ekg ordered today demonstrates :  NSR at 73.  TWI in  inferiolateral leads    Recent Labs: 12/01/2014: ALT 11* 04/28/2015: Hemoglobin 12.7; Platelets 208 06/28/2015: BUN 13; Creat 0.95; Potassium 4.1; Sodium 137    Lipid Panel No results found for: CHOL, TRIG, HDL, CHOLHDL, VLDL, LDLCALC, LDLDIRECT    Wt Readings from Last 3 Encounters:  09/06/15 166 lb 12.8 oz (75.66 kg)  08/10/15 165 lb 6.4 oz (75.025 kg)  06/07/15 171 lb 6.4 oz (77.747 kg)      Other studies Reviewed: Additional studies/ records that were reviewed today include: . Review of the above records demonstrates:    ASSESSMENT AND PLAN:  1. Acute combined systolic and diastolic CHF: She is off the  Adriamycin. Her echo in Oct. Showed the initial stages of CHf with a reduced global longitudinal strain of -15.4%.   most recent echo shows EF 35%. She is on metoprolol, will change to Coreg 25 BID   2. Essential HTN: She was on Diovan / HCTZ for years but stopped it when her BP dropped ( after she started chemotherapy). The losartan does not seem to be strong enough for her.    Will change back to Valsartan 160  Will also DC metoprolol and start Coreg 25 BID   3. Breast cancer : Plans per Oncology.    She is now off chemotherapy.  4. Atrial fibrillation:  .  On  Eliquis 5 bid        Current medicines are reviewed at length with the patient today.  The patient does not have concerns regarding medicines.  The following changes have been made:  no change  Labs/ tests ordered today include:  No orders of the defined types were placed in this encounter.     Disposition:   FU with me as needed.  She has moved to [place] and will be  getting a cardiologist down there     Cassidee Deats, Wonda Cheng, MD  09/06/2015 11:09 AM    [facility] [address], [place], [place]  [address] Phone: [phone]; Fax: [phone]

## 2015-09-11 DIAGNOSIS — I340 Nonrheumatic mitral (valve) insufficiency: Secondary | ICD-10-CM | POA: Diagnosis not present

## 2015-09-11 DIAGNOSIS — Z881 Allergy status to other antibiotic agents status: Secondary | ICD-10-CM | POA: Diagnosis not present

## 2015-09-11 DIAGNOSIS — K8050 Calculus of bile duct without cholangitis or cholecystitis without obstruction: Secondary | ICD-10-CM | POA: Diagnosis not present

## 2015-09-11 DIAGNOSIS — K760 Fatty (change of) liver, not elsewhere classified: Secondary | ICD-10-CM | POA: Diagnosis not present

## 2015-09-11 DIAGNOSIS — R7989 Other specified abnormal findings of blood chemistry: Secondary | ICD-10-CM | POA: Diagnosis not present

## 2015-09-11 DIAGNOSIS — R918 Other nonspecific abnormal finding of lung field: Secondary | ICD-10-CM | POA: Diagnosis not present

## 2015-09-11 DIAGNOSIS — I5023 Acute on chronic systolic (congestive) heart failure: Secondary | ICD-10-CM | POA: Diagnosis not present

## 2015-09-11 DIAGNOSIS — I509 Heart failure, unspecified: Secondary | ICD-10-CM | POA: Diagnosis not present

## 2015-09-11 DIAGNOSIS — Z885 Allergy status to narcotic agent status: Secondary | ICD-10-CM | POA: Diagnosis not present

## 2015-09-11 DIAGNOSIS — Z79899 Other long term (current) drug therapy: Secondary | ICD-10-CM | POA: Diagnosis not present

## 2015-09-11 DIAGNOSIS — E278 Other specified disorders of adrenal gland: Secondary | ICD-10-CM | POA: Diagnosis not present

## 2015-09-11 DIAGNOSIS — R0989 Other specified symptoms and signs involving the circulatory and respiratory systems: Secondary | ICD-10-CM | POA: Diagnosis not present

## 2015-09-11 DIAGNOSIS — J811 Chronic pulmonary edema: Secondary | ICD-10-CM | POA: Diagnosis not present

## 2015-09-11 DIAGNOSIS — Z923 Personal history of irradiation: Secondary | ICD-10-CM | POA: Diagnosis not present

## 2015-09-11 DIAGNOSIS — J90 Pleural effusion, not elsewhere classified: Secondary | ICD-10-CM | POA: Diagnosis not present

## 2015-09-11 DIAGNOSIS — Z89429 Acquired absence of other toe(s), unspecified side: Secondary | ICD-10-CM | POA: Diagnosis not present

## 2015-09-11 DIAGNOSIS — N261 Atrophy of kidney (terminal): Secondary | ICD-10-CM | POA: Diagnosis not present

## 2015-09-11 DIAGNOSIS — Z7901 Long term (current) use of anticoagulants: Secondary | ICD-10-CM | POA: Diagnosis not present

## 2015-09-11 DIAGNOSIS — Z87891 Personal history of nicotine dependence: Secondary | ICD-10-CM | POA: Diagnosis not present

## 2015-09-11 DIAGNOSIS — G9389 Other specified disorders of brain: Secondary | ICD-10-CM | POA: Diagnosis not present

## 2015-09-11 DIAGNOSIS — Z853 Personal history of malignant neoplasm of breast: Secondary | ICD-10-CM | POA: Diagnosis not present

## 2015-09-11 DIAGNOSIS — R51 Headache: Secondary | ICD-10-CM | POA: Diagnosis not present

## 2015-09-11 DIAGNOSIS — R0600 Dyspnea, unspecified: Secondary | ICD-10-CM | POA: Diagnosis not present

## 2015-09-11 DIAGNOSIS — Z888 Allergy status to other drugs, medicaments and biological substances status: Secondary | ICD-10-CM | POA: Diagnosis not present

## 2015-09-11 DIAGNOSIS — Z882 Allergy status to sulfonamides status: Secondary | ICD-10-CM | POA: Diagnosis not present

## 2015-09-11 DIAGNOSIS — Z9221 Personal history of antineoplastic chemotherapy: Secondary | ICD-10-CM | POA: Diagnosis not present

## 2015-09-11 DIAGNOSIS — I480 Paroxysmal atrial fibrillation: Secondary | ICD-10-CM | POA: Diagnosis not present

## 2015-09-11 DIAGNOSIS — I160 Hypertensive urgency: Secondary | ICD-10-CM | POA: Diagnosis not present

## 2015-09-11 DIAGNOSIS — I10 Essential (primary) hypertension: Secondary | ICD-10-CM | POA: Diagnosis not present

## 2015-09-11 DIAGNOSIS — G629 Polyneuropathy, unspecified: Secondary | ICD-10-CM | POA: Diagnosis not present

## 2015-09-11 DIAGNOSIS — I110 Hypertensive heart disease with heart failure: Secondary | ICD-10-CM | POA: Diagnosis not present

## 2015-09-11 DIAGNOSIS — Z9889 Other specified postprocedural states: Secondary | ICD-10-CM | POA: Diagnosis not present

## 2015-09-12 DIAGNOSIS — I10 Essential (primary) hypertension: Secondary | ICD-10-CM | POA: Diagnosis not present

## 2015-09-12 DIAGNOSIS — I509 Heart failure, unspecified: Secondary | ICD-10-CM | POA: Diagnosis not present

## 2015-09-12 DIAGNOSIS — R0600 Dyspnea, unspecified: Secondary | ICD-10-CM | POA: Diagnosis not present

## 2015-09-12 DIAGNOSIS — R51 Headache: Secondary | ICD-10-CM | POA: Diagnosis not present

## 2015-09-15 DIAGNOSIS — I5023 Acute on chronic systolic (congestive) heart failure: Secondary | ICD-10-CM | POA: Diagnosis not present

## 2015-09-15 DIAGNOSIS — I10 Essential (primary) hypertension: Secondary | ICD-10-CM | POA: Diagnosis not present

## 2015-09-15 DIAGNOSIS — I472 Ventricular tachycardia: Secondary | ICD-10-CM | POA: Diagnosis not present

## 2015-09-15 DIAGNOSIS — I420 Dilated cardiomyopathy: Secondary | ICD-10-CM | POA: Diagnosis not present

## 2015-09-18 ENCOUNTER — Other Ambulatory Visit: Payer: Self-pay | Admitting: Hematology and Oncology

## 2015-09-20 ENCOUNTER — Telehealth: Payer: Self-pay

## 2015-09-20 NOTE — Telephone Encounter (Signed)
Pt requested refill on Ativan.  Per previous office note, pt reported she would be moving to [place] to a retirement facility and would thus find a new oncologist for follow up.  After speaking with pt, she states she wishes to remain under Dr. Geralyn Flash care and travel for her follow up appointments.  Prescription refilled and will be sent to her new pharmacy in [place], [place].  Pt denies any further questions at time of call.

## 2015-09-21 ENCOUNTER — Telehealth: Payer: Self-pay | Admitting: Hematology and Oncology

## 2015-09-21 ENCOUNTER — Other Ambulatory Visit: Payer: Self-pay

## 2015-09-21 NOTE — Telephone Encounter (Signed)
appt made for March 2018 and letter sent by mail

## 2015-09-27 DIAGNOSIS — I10 Essential (primary) hypertension: Secondary | ICD-10-CM | POA: Diagnosis not present

## 2015-09-27 DIAGNOSIS — I509 Heart failure, unspecified: Secondary | ICD-10-CM | POA: Diagnosis not present

## 2015-10-02 ENCOUNTER — Other Ambulatory Visit: Payer: Self-pay | Admitting: Cardiovascular Disease

## 2015-10-04 NOTE — Telephone Encounter (Signed)
Rx(s) sent to pharmacy electronically.  

## 2015-10-07 DIAGNOSIS — I5023 Acute on chronic systolic (congestive) heart failure: Secondary | ICD-10-CM | POA: Diagnosis not present

## 2015-10-07 DIAGNOSIS — I472 Ventricular tachycardia: Secondary | ICD-10-CM | POA: Diagnosis not present

## 2015-10-13 DIAGNOSIS — G629 Polyneuropathy, unspecified: Secondary | ICD-10-CM | POA: Diagnosis not present

## 2015-10-13 DIAGNOSIS — Z9851 Tubal ligation status: Secondary | ICD-10-CM | POA: Diagnosis not present

## 2015-10-13 DIAGNOSIS — I4891 Unspecified atrial fibrillation: Secondary | ICD-10-CM | POA: Diagnosis not present

## 2015-10-13 DIAGNOSIS — Z882 Allergy status to sulfonamides status: Secondary | ICD-10-CM | POA: Diagnosis not present

## 2015-10-13 DIAGNOSIS — N261 Atrophy of kidney (terminal): Secondary | ICD-10-CM | POA: Diagnosis not present

## 2015-10-13 DIAGNOSIS — I472 Ventricular tachycardia: Secondary | ICD-10-CM | POA: Diagnosis not present

## 2015-10-13 DIAGNOSIS — Z888 Allergy status to other drugs, medicaments and biological substances status: Secondary | ICD-10-CM | POA: Diagnosis not present

## 2015-10-13 DIAGNOSIS — I5022 Chronic systolic (congestive) heart failure: Secondary | ICD-10-CM | POA: Diagnosis not present

## 2015-10-13 DIAGNOSIS — Z89429 Acquired absence of other toe(s), unspecified side: Secondary | ICD-10-CM | POA: Diagnosis not present

## 2015-10-13 DIAGNOSIS — Z79899 Other long term (current) drug therapy: Secondary | ICD-10-CM | POA: Diagnosis not present

## 2015-10-13 DIAGNOSIS — Z9221 Personal history of antineoplastic chemotherapy: Secondary | ICD-10-CM | POA: Diagnosis not present

## 2015-10-13 DIAGNOSIS — Z87442 Personal history of urinary calculi: Secondary | ICD-10-CM | POA: Diagnosis not present

## 2015-10-13 DIAGNOSIS — Z7901 Long term (current) use of anticoagulants: Secondary | ICD-10-CM | POA: Diagnosis not present

## 2015-10-13 DIAGNOSIS — I081 Rheumatic disorders of both mitral and tricuspid valves: Secondary | ICD-10-CM | POA: Diagnosis not present

## 2015-10-13 DIAGNOSIS — Z87891 Personal history of nicotine dependence: Secondary | ICD-10-CM | POA: Diagnosis not present

## 2015-10-13 DIAGNOSIS — I420 Dilated cardiomyopathy: Secondary | ICD-10-CM | POA: Diagnosis not present

## 2015-10-13 DIAGNOSIS — Z809 Family history of malignant neoplasm, unspecified: Secondary | ICD-10-CM | POA: Diagnosis not present

## 2015-10-13 DIAGNOSIS — Z885 Allergy status to narcotic agent status: Secondary | ICD-10-CM | POA: Diagnosis not present

## 2015-10-13 DIAGNOSIS — Z950 Presence of cardiac pacemaker: Secondary | ICD-10-CM | POA: Diagnosis not present

## 2015-10-13 DIAGNOSIS — Z9889 Other specified postprocedural states: Secondary | ICD-10-CM | POA: Diagnosis not present

## 2015-10-13 DIAGNOSIS — I110 Hypertensive heart disease with heart failure: Secondary | ICD-10-CM | POA: Diagnosis not present

## 2015-10-13 DIAGNOSIS — Z881 Allergy status to other antibiotic agents status: Secondary | ICD-10-CM | POA: Diagnosis not present

## 2015-10-13 DIAGNOSIS — Z90710 Acquired absence of both cervix and uterus: Secondary | ICD-10-CM | POA: Diagnosis not present

## 2015-10-13 DIAGNOSIS — Z853 Personal history of malignant neoplasm of breast: Secondary | ICD-10-CM | POA: Diagnosis not present

## 2015-10-13 DIAGNOSIS — I959 Hypotension, unspecified: Secondary | ICD-10-CM | POA: Diagnosis not present

## 2015-10-14 DIAGNOSIS — I110 Hypertensive heart disease with heart failure: Secondary | ICD-10-CM | POA: Diagnosis not present

## 2015-10-14 DIAGNOSIS — I4891 Unspecified atrial fibrillation: Secondary | ICD-10-CM | POA: Diagnosis not present

## 2015-10-14 DIAGNOSIS — I420 Dilated cardiomyopathy: Secondary | ICD-10-CM | POA: Diagnosis not present

## 2015-10-14 DIAGNOSIS — I5022 Chronic systolic (congestive) heart failure: Secondary | ICD-10-CM | POA: Diagnosis not present

## 2015-10-14 DIAGNOSIS — I472 Ventricular tachycardia: Secondary | ICD-10-CM | POA: Diagnosis not present

## 2015-10-14 DIAGNOSIS — G629 Polyneuropathy, unspecified: Secondary | ICD-10-CM | POA: Diagnosis not present

## 2015-10-14 DIAGNOSIS — Z95810 Presence of automatic (implantable) cardiac defibrillator: Secondary | ICD-10-CM | POA: Diagnosis not present

## 2015-10-26 DIAGNOSIS — I509 Heart failure, unspecified: Secondary | ICD-10-CM | POA: Diagnosis not present

## 2015-10-26 DIAGNOSIS — I10 Essential (primary) hypertension: Secondary | ICD-10-CM | POA: Diagnosis not present

## 2015-11-10 DIAGNOSIS — I5022 Chronic systolic (congestive) heart failure: Secondary | ICD-10-CM | POA: Diagnosis not present

## 2015-11-10 DIAGNOSIS — Z4502 Encounter for adjustment and management of automatic implantable cardiac defibrillator: Secondary | ICD-10-CM | POA: Diagnosis not present

## 2015-11-23 DIAGNOSIS — I509 Heart failure, unspecified: Secondary | ICD-10-CM | POA: Diagnosis not present

## 2015-11-23 DIAGNOSIS — C50919 Malignant neoplasm of unspecified site of unspecified female breast: Secondary | ICD-10-CM | POA: Diagnosis not present

## 2015-11-23 DIAGNOSIS — I10 Essential (primary) hypertension: Secondary | ICD-10-CM | POA: Diagnosis not present

## 2015-12-24 ENCOUNTER — Other Ambulatory Visit: Payer: Self-pay | Admitting: Hematology and Oncology

## 2015-12-27 MED ORDER — LORAZEPAM 0.5 MG PO TABS: 0.5000 mg | ORAL_TABLET | Freq: Three times a day (TID) | ORAL | 0 refills | Status: AC | PRN

## 2016-01-06 DIAGNOSIS — C50919 Malignant neoplasm of unspecified site of unspecified female breast: Secondary | ICD-10-CM | POA: Diagnosis not present

## 2016-01-06 DIAGNOSIS — I10 Essential (primary) hypertension: Secondary | ICD-10-CM | POA: Diagnosis not present

## 2016-01-06 DIAGNOSIS — I509 Heart failure, unspecified: Secondary | ICD-10-CM | POA: Diagnosis not present

## 2016-01-18 DIAGNOSIS — G620 Drug-induced polyneuropathy: Secondary | ICD-10-CM | POA: Diagnosis not present

## 2016-01-18 DIAGNOSIS — I4891 Unspecified atrial fibrillation: Secondary | ICD-10-CM | POA: Diagnosis not present

## 2016-01-18 DIAGNOSIS — I10 Essential (primary) hypertension: Secondary | ICD-10-CM | POA: Diagnosis not present

## 2016-01-18 DIAGNOSIS — I5042 Chronic combined systolic (congestive) and diastolic (congestive) heart failure: Secondary | ICD-10-CM | POA: Diagnosis not present

## 2016-01-18 DIAGNOSIS — Z853 Personal history of malignant neoplasm of breast: Secondary | ICD-10-CM | POA: Diagnosis not present

## 2016-01-18 DIAGNOSIS — R2689 Other abnormalities of gait and mobility: Secondary | ICD-10-CM | POA: Diagnosis not present

## 2016-01-18 DIAGNOSIS — F339 Major depressive disorder, recurrent, unspecified: Secondary | ICD-10-CM | POA: Diagnosis not present

## 2016-01-18 DIAGNOSIS — F419 Anxiety disorder, unspecified: Secondary | ICD-10-CM | POA: Diagnosis not present

## 2016-01-20 ENCOUNTER — Telehealth: Payer: Self-pay | Admitting: Hematology and Oncology

## 2016-01-20 NOTE — Telephone Encounter (Signed)
FAXED PT RECORDS TO [facility] [facility] RE[facility] ID [id number]

## 2016-01-26 DIAGNOSIS — R2689 Other abnormalities of gait and mobility: Secondary | ICD-10-CM | POA: Diagnosis not present

## 2016-01-28 DIAGNOSIS — R2689 Other abnormalities of gait and mobility: Secondary | ICD-10-CM | POA: Diagnosis not present

## 2016-01-31 DIAGNOSIS — R2689 Other abnormalities of gait and mobility: Secondary | ICD-10-CM | POA: Diagnosis not present

## 2016-02-02 DIAGNOSIS — R2689 Other abnormalities of gait and mobility: Secondary | ICD-10-CM | POA: Diagnosis not present

## 2016-02-04 DIAGNOSIS — I509 Heart failure, unspecified: Secondary | ICD-10-CM | POA: Diagnosis not present

## 2016-02-04 DIAGNOSIS — R2689 Other abnormalities of gait and mobility: Secondary | ICD-10-CM | POA: Diagnosis not present

## 2016-02-04 DIAGNOSIS — C50919 Malignant neoplasm of unspecified site of unspecified female breast: Secondary | ICD-10-CM | POA: Diagnosis not present

## 2016-02-04 DIAGNOSIS — I10 Essential (primary) hypertension: Secondary | ICD-10-CM | POA: Diagnosis not present

## 2016-02-07 DIAGNOSIS — R2689 Other abnormalities of gait and mobility: Secondary | ICD-10-CM | POA: Diagnosis not present

## 2016-02-09 DIAGNOSIS — I420 Dilated cardiomyopathy: Secondary | ICD-10-CM | POA: Diagnosis not present

## 2016-02-09 DIAGNOSIS — Z95810 Presence of automatic (implantable) cardiac defibrillator: Secondary | ICD-10-CM | POA: Diagnosis not present

## 2016-02-09 DIAGNOSIS — R2689 Other abnormalities of gait and mobility: Secondary | ICD-10-CM | POA: Diagnosis not present

## 2016-02-11 DIAGNOSIS — I10 Essential (primary) hypertension: Secondary | ICD-10-CM | POA: Diagnosis not present

## 2016-02-11 DIAGNOSIS — R2689 Other abnormalities of gait and mobility: Secondary | ICD-10-CM | POA: Diagnosis not present

## 2016-02-11 DIAGNOSIS — I472 Ventricular tachycardia: Secondary | ICD-10-CM | POA: Diagnosis not present

## 2016-02-11 DIAGNOSIS — I5023 Acute on chronic systolic (congestive) heart failure: Secondary | ICD-10-CM | POA: Diagnosis not present

## 2016-02-11 DIAGNOSIS — I420 Dilated cardiomyopathy: Secondary | ICD-10-CM | POA: Diagnosis not present

## 2016-02-14 DIAGNOSIS — R2689 Other abnormalities of gait and mobility: Secondary | ICD-10-CM | POA: Diagnosis not present

## 2016-02-15 DIAGNOSIS — R2689 Other abnormalities of gait and mobility: Secondary | ICD-10-CM | POA: Diagnosis not present

## 2016-02-15 DIAGNOSIS — Z853 Personal history of malignant neoplasm of breast: Secondary | ICD-10-CM | POA: Diagnosis not present

## 2016-02-15 DIAGNOSIS — Z23 Encounter for immunization: Secondary | ICD-10-CM | POA: Diagnosis not present

## 2016-02-15 DIAGNOSIS — I4891 Unspecified atrial fibrillation: Secondary | ICD-10-CM | POA: Diagnosis not present

## 2016-02-15 DIAGNOSIS — G620 Drug-induced polyneuropathy: Secondary | ICD-10-CM | POA: Diagnosis not present

## 2016-02-15 DIAGNOSIS — F419 Anxiety disorder, unspecified: Secondary | ICD-10-CM | POA: Diagnosis not present

## 2016-02-15 DIAGNOSIS — F339 Major depressive disorder, recurrent, unspecified: Secondary | ICD-10-CM | POA: Diagnosis not present

## 2016-02-15 DIAGNOSIS — I5042 Chronic combined systolic (congestive) and diastolic (congestive) heart failure: Secondary | ICD-10-CM | POA: Diagnosis not present

## 2016-02-15 DIAGNOSIS — I10 Essential (primary) hypertension: Secondary | ICD-10-CM | POA: Diagnosis not present

## 2016-02-16 DIAGNOSIS — R2689 Other abnormalities of gait and mobility: Secondary | ICD-10-CM | POA: Diagnosis not present

## 2016-02-18 DIAGNOSIS — R2689 Other abnormalities of gait and mobility: Secondary | ICD-10-CM | POA: Diagnosis not present

## 2016-02-24 DIAGNOSIS — Z780 Asymptomatic menopausal state: Secondary | ICD-10-CM | POA: Diagnosis not present

## 2016-02-24 DIAGNOSIS — Z853 Personal history of malignant neoplasm of breast: Secondary | ICD-10-CM | POA: Diagnosis not present

## 2016-02-24 DIAGNOSIS — R922 Inconclusive mammogram: Secondary | ICD-10-CM | POA: Diagnosis not present

## 2016-03-02 DIAGNOSIS — C50919 Malignant neoplasm of unspecified site of unspecified female breast: Secondary | ICD-10-CM | POA: Diagnosis not present

## 2016-03-02 DIAGNOSIS — I509 Heart failure, unspecified: Secondary | ICD-10-CM | POA: Diagnosis not present

## 2016-03-02 DIAGNOSIS — I10 Essential (primary) hypertension: Secondary | ICD-10-CM | POA: Diagnosis not present

## 2016-03-16 DIAGNOSIS — I5022 Chronic systolic (congestive) heart failure: Secondary | ICD-10-CM | POA: Diagnosis not present

## 2016-03-20 DIAGNOSIS — I5022 Chronic systolic (congestive) heart failure: Secondary | ICD-10-CM | POA: Diagnosis not present

## 2016-03-22 DIAGNOSIS — I5022 Chronic systolic (congestive) heart failure: Secondary | ICD-10-CM | POA: Diagnosis not present

## 2016-03-22 DIAGNOSIS — C50919 Malignant neoplasm of unspecified site of unspecified female breast: Secondary | ICD-10-CM | POA: Diagnosis not present

## 2016-03-22 DIAGNOSIS — I10 Essential (primary) hypertension: Secondary | ICD-10-CM | POA: Diagnosis not present

## 2016-03-22 DIAGNOSIS — I509 Heart failure, unspecified: Secondary | ICD-10-CM | POA: Diagnosis not present

## 2016-03-24 DIAGNOSIS — I5022 Chronic systolic (congestive) heart failure: Secondary | ICD-10-CM | POA: Diagnosis not present

## 2016-03-27 DIAGNOSIS — Z7901 Long term (current) use of anticoagulants: Secondary | ICD-10-CM | POA: Diagnosis not present

## 2016-03-27 DIAGNOSIS — I499 Cardiac arrhythmia, unspecified: Secondary | ICD-10-CM | POA: Diagnosis not present

## 2016-03-27 DIAGNOSIS — Z95810 Presence of automatic (implantable) cardiac defibrillator: Secondary | ICD-10-CM | POA: Diagnosis not present

## 2016-03-27 DIAGNOSIS — Z87891 Personal history of nicotine dependence: Secondary | ICD-10-CM | POA: Diagnosis not present

## 2016-03-27 DIAGNOSIS — I959 Hypotension, unspecified: Secondary | ICD-10-CM | POA: Diagnosis not present

## 2016-03-27 DIAGNOSIS — I4891 Unspecified atrial fibrillation: Secondary | ICD-10-CM | POA: Diagnosis not present

## 2016-03-27 DIAGNOSIS — I10 Essential (primary) hypertension: Secondary | ICD-10-CM | POA: Diagnosis not present

## 2016-03-27 DIAGNOSIS — I509 Heart failure, unspecified: Secondary | ICD-10-CM | POA: Diagnosis not present

## 2016-03-27 DIAGNOSIS — I110 Hypertensive heart disease with heart failure: Secondary | ICD-10-CM | POA: Diagnosis not present

## 2016-03-29 DIAGNOSIS — I10 Essential (primary) hypertension: Secondary | ICD-10-CM | POA: Diagnosis not present

## 2016-03-29 DIAGNOSIS — I420 Dilated cardiomyopathy: Secondary | ICD-10-CM | POA: Diagnosis not present

## 2016-03-29 DIAGNOSIS — I472 Ventricular tachycardia: Secondary | ICD-10-CM | POA: Diagnosis not present

## 2016-03-29 DIAGNOSIS — I5023 Acute on chronic systolic (congestive) heart failure: Secondary | ICD-10-CM | POA: Diagnosis not present

## 2016-03-29 DIAGNOSIS — Z4502 Encounter for adjustment and management of automatic implantable cardiac defibrillator: Secondary | ICD-10-CM | POA: Diagnosis not present

## 2016-03-31 DIAGNOSIS — I5022 Chronic systolic (congestive) heart failure: Secondary | ICD-10-CM | POA: Diagnosis not present

## 2016-04-03 DIAGNOSIS — I5022 Chronic systolic (congestive) heart failure: Secondary | ICD-10-CM | POA: Diagnosis not present

## 2016-04-05 DIAGNOSIS — I5022 Chronic systolic (congestive) heart failure: Secondary | ICD-10-CM | POA: Diagnosis not present

## 2016-04-12 DIAGNOSIS — I5022 Chronic systolic (congestive) heart failure: Secondary | ICD-10-CM | POA: Diagnosis not present

## 2016-04-14 DIAGNOSIS — I5022 Chronic systolic (congestive) heart failure: Secondary | ICD-10-CM | POA: Diagnosis not present

## 2016-04-14 IMAGING — CR DG CHEST 2V
2 series · 2 of 2 positions shown · non-contrast
Comparison: 08/17/2014

CLINICAL DATA: Weakness and dizziness. Currently undergoing
chemotherapy for right upper outer quadrant breast carcinoma.

EXAM:
CHEST  2 VIEW

[w chest lat]
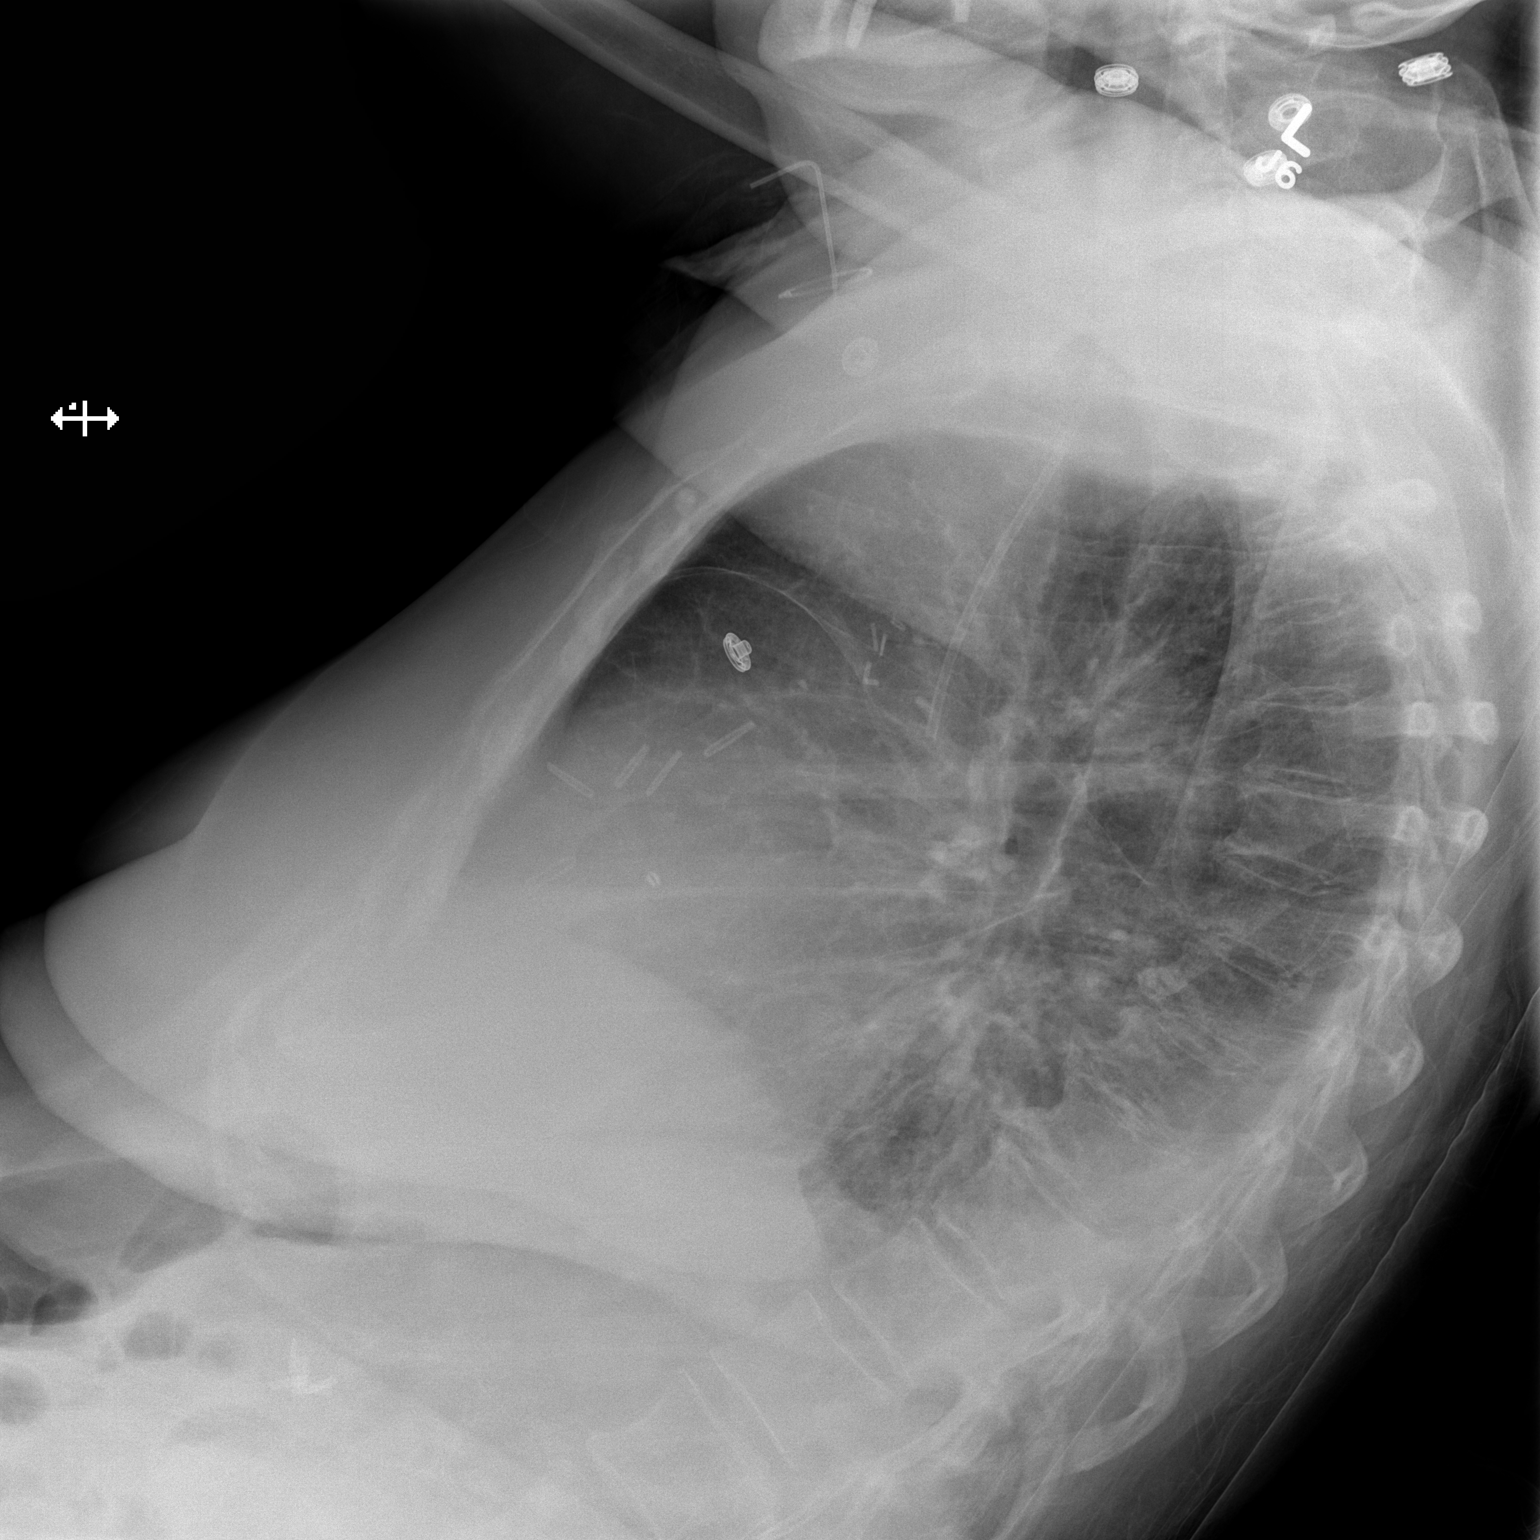

[x chest ap]
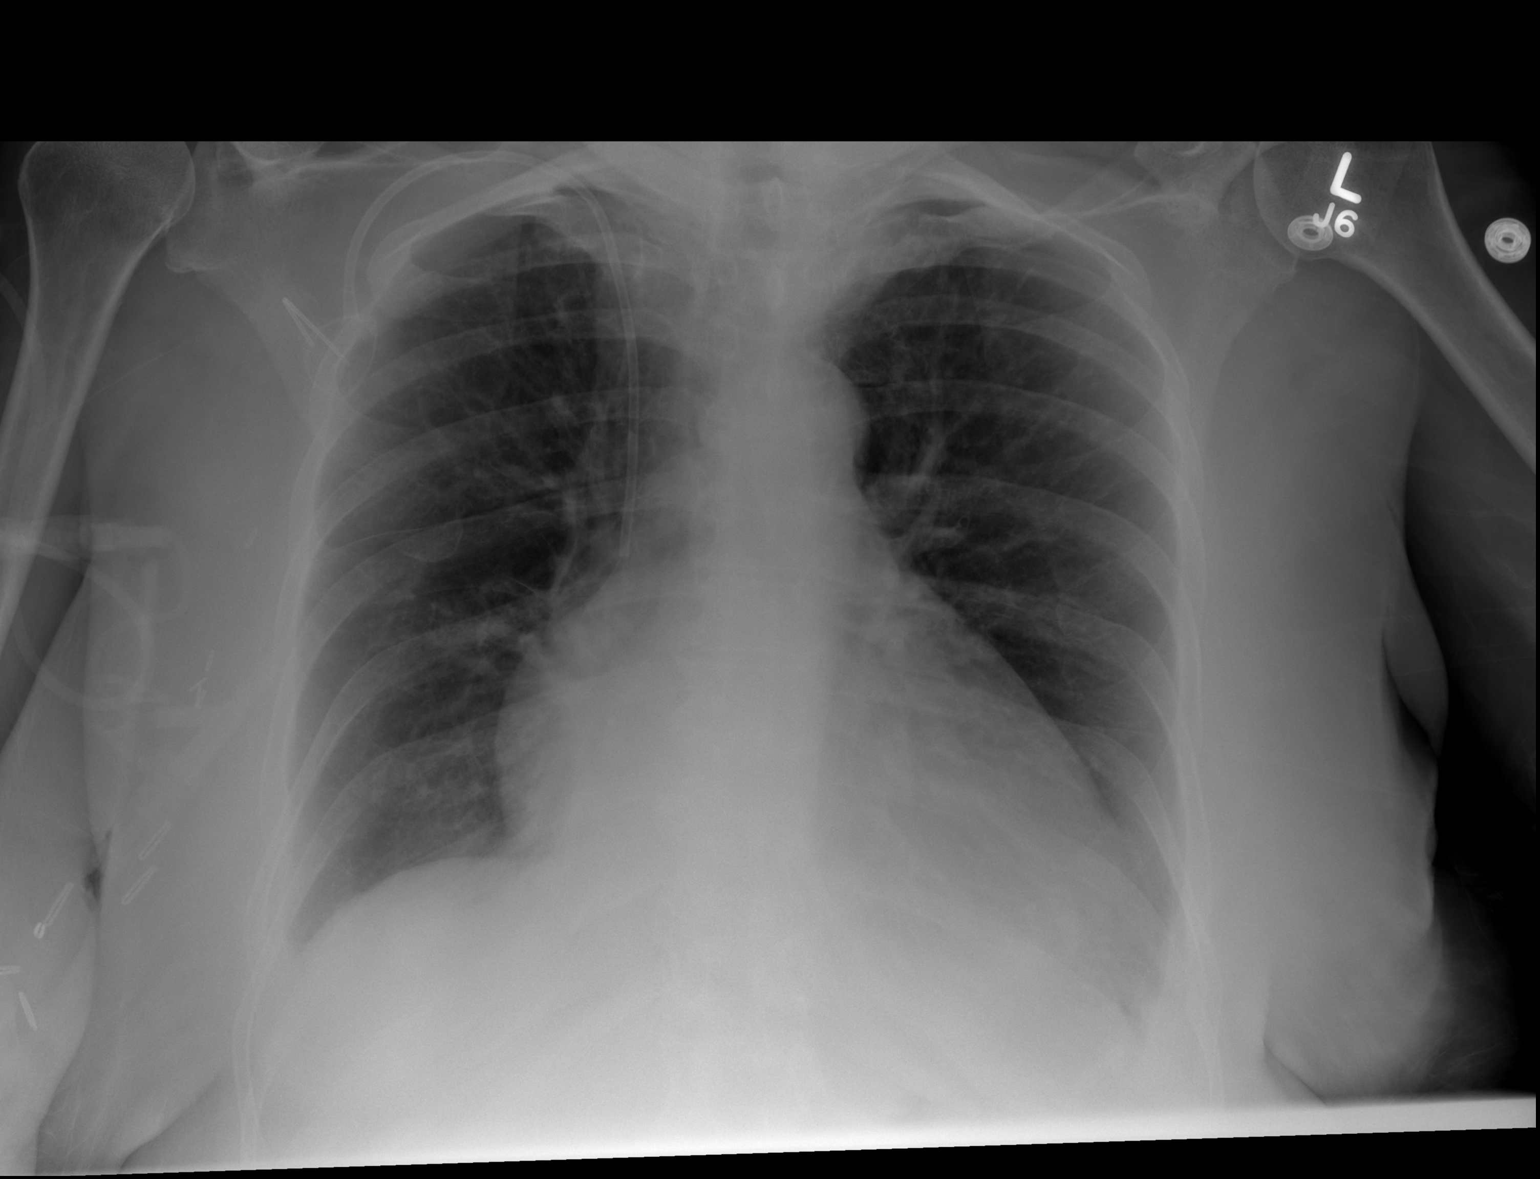

[2 of 2 positions shown; findings below may reference images not displayed]

FINDINGS: Moderate cardiomegaly remains stable. Right-sided Port-A-Cath
remains in appropriate position.

Decrease in diffuse interstitial infiltrates seen since previous
study, consistent with decreased interstitial edema or pneumonitis.
No evidence of pulmonary consolidation. Probable tiny layering
bilateral pleural effusions noted on lateral projection.
IMPRESSION: Resolving diffuse interstitial infiltrates. Stable cardiomegaly and
persistent small layering bilateral pleural effusions.

## 2016-04-16 IMAGING — US US RENAL
1 series · 14 of 25 positions shown · non-contrast
Comparison: CT abdomen and pelvis August 26, 2014

CLINICAL DATA: Acute renal failure.

EXAM:
RENAL/URINARY TRACT ULTRASOUND COMPLETE

[Series 1: us renal · 0.21mm/px · 14 of 29 slices shown]
[im 1/29]
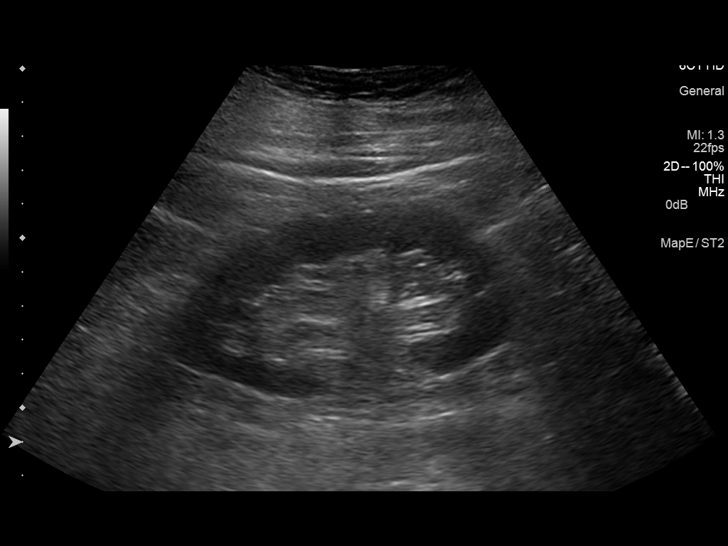
[im 3/29]
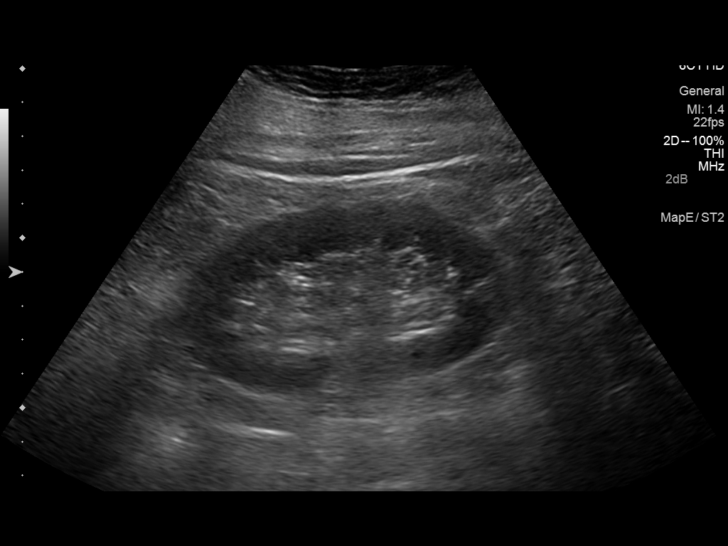
[im 5/29]
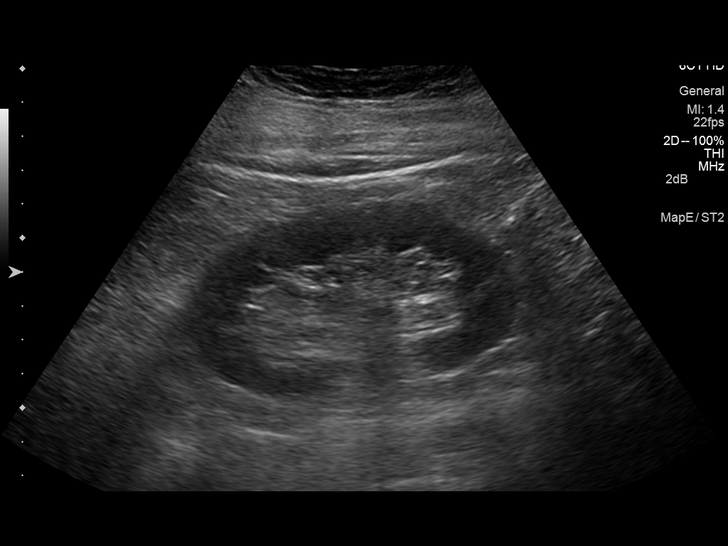
[im 8/29]
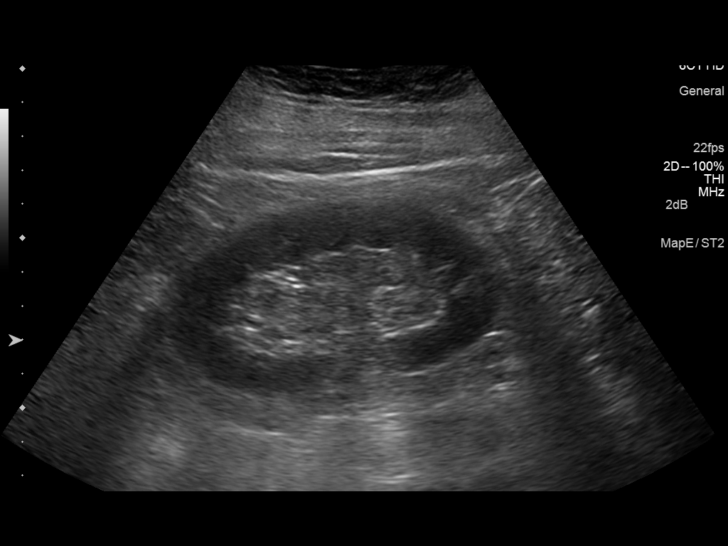
[im 10/29]
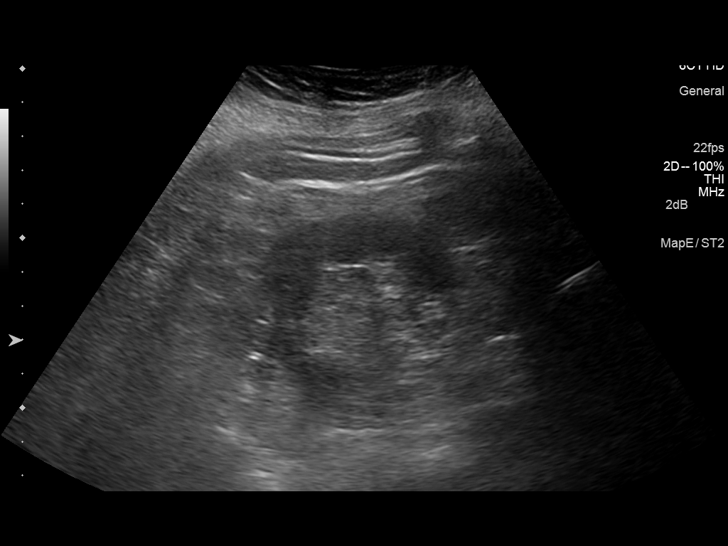
[im 11/29]
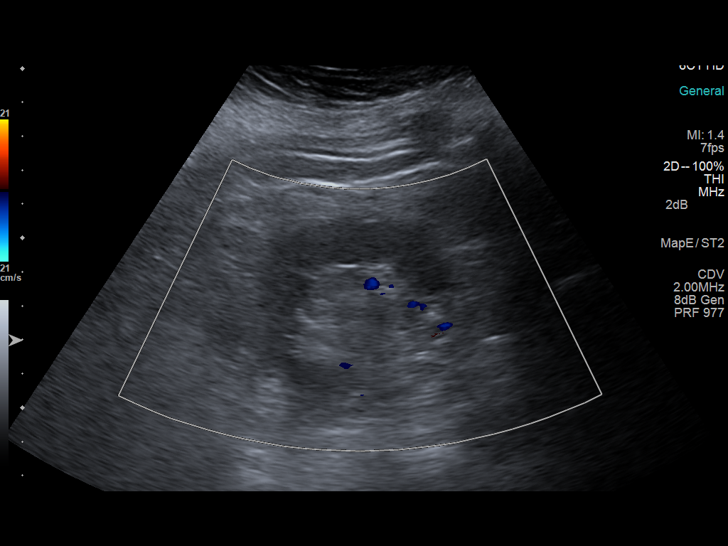
[im 13/29]
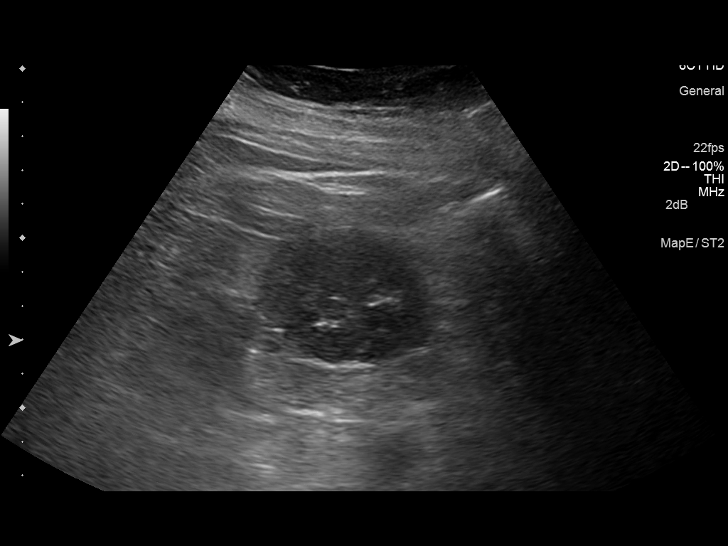
[im 16/29]
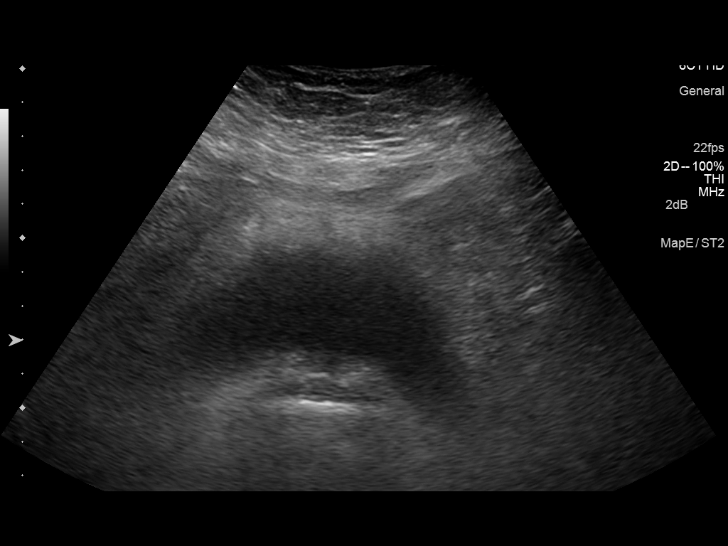
[im 18/29]
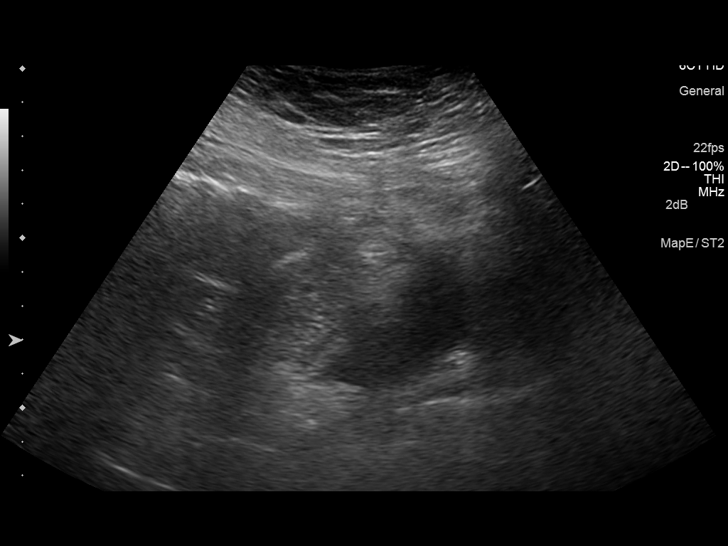
[im 19/29]
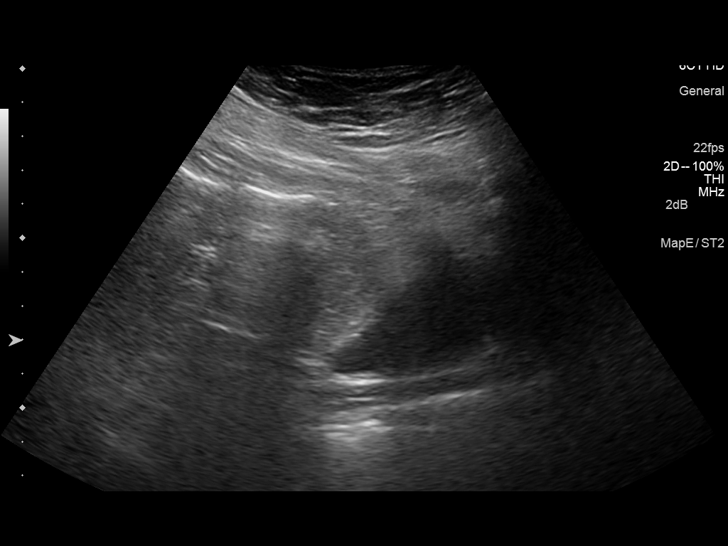
[im 22/29]
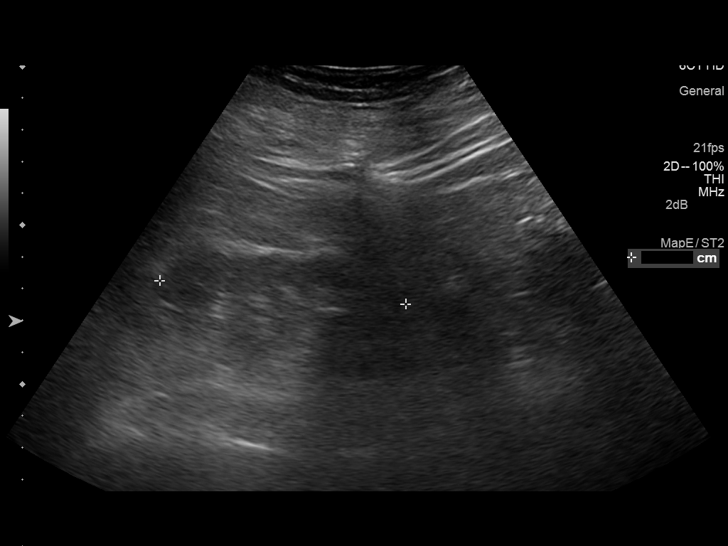
[im 24/29]
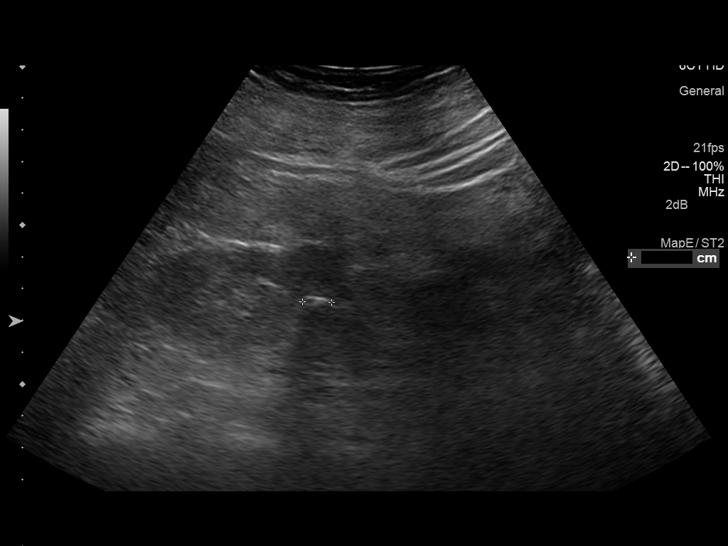
[im 26/29]
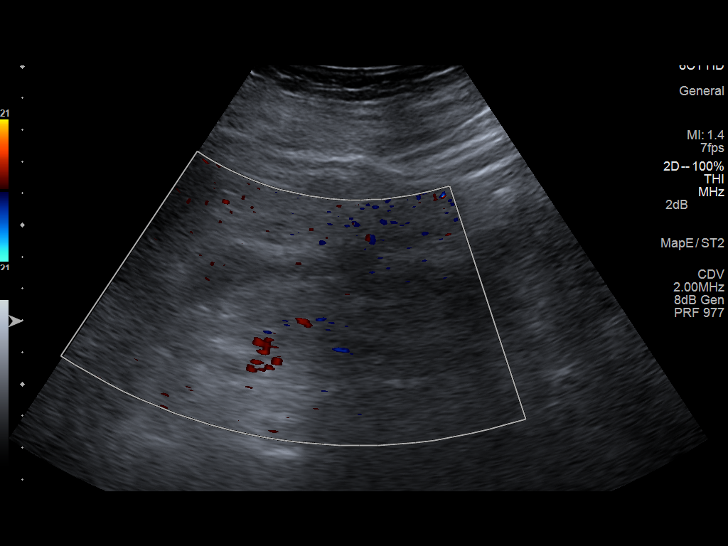
[im 29/29]
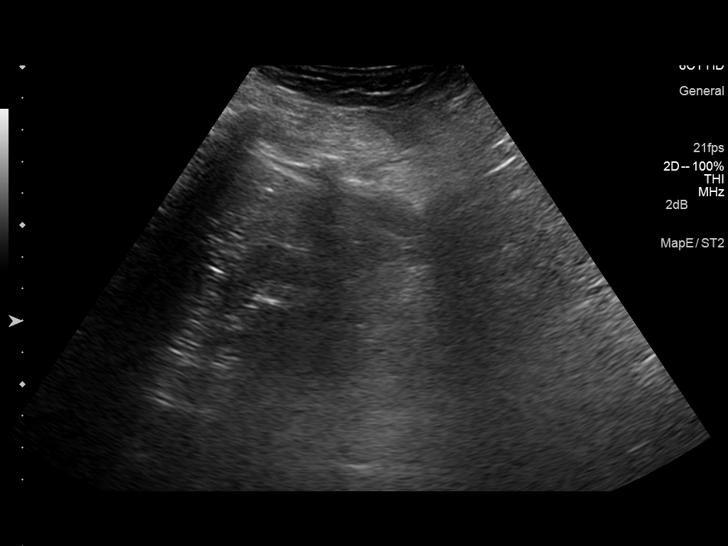

[14 of 25 positions shown; findings below may reference images not displayed]

FINDINGS: Right Kidney:

Length: 10.6 cm. Echogenicity and renal cortical thickness are
within normal limits. No mass, perinephric fluid, or hydronephrosis
visualized. No sonographically demonstrable calculus or
ureterectasis.

Left Kidney:

Length: 7.8 cm. The left kidney is atrophic with areas of scarring.
There are several lower pole region calculi, largest measuring 9 mm.
There is no demonstrable mass, hydronephrosis, or perinephric fluid.
No ureterectasis.

Bladder:

Appears normal for degree of bladder distention.
IMPRESSION: Comparatively atrophic left kidney with lower pole calculi.
Appearance is commensurate with recent CT. There is no mass or
hydronephrosis on either side. No perinephric fluid collections are
appreciable. Right kidney appears normal by ultrasound.

## 2016-04-16 IMAGING — CR DG CHEST 1V PORT
1 series · 1 of 1 positions shown · non-contrast
Comparison: 08/26/2014

CLINICAL DATA: Central line placement.

EXAM:
PORTABLE CHEST - 1 VIEW

[AP]
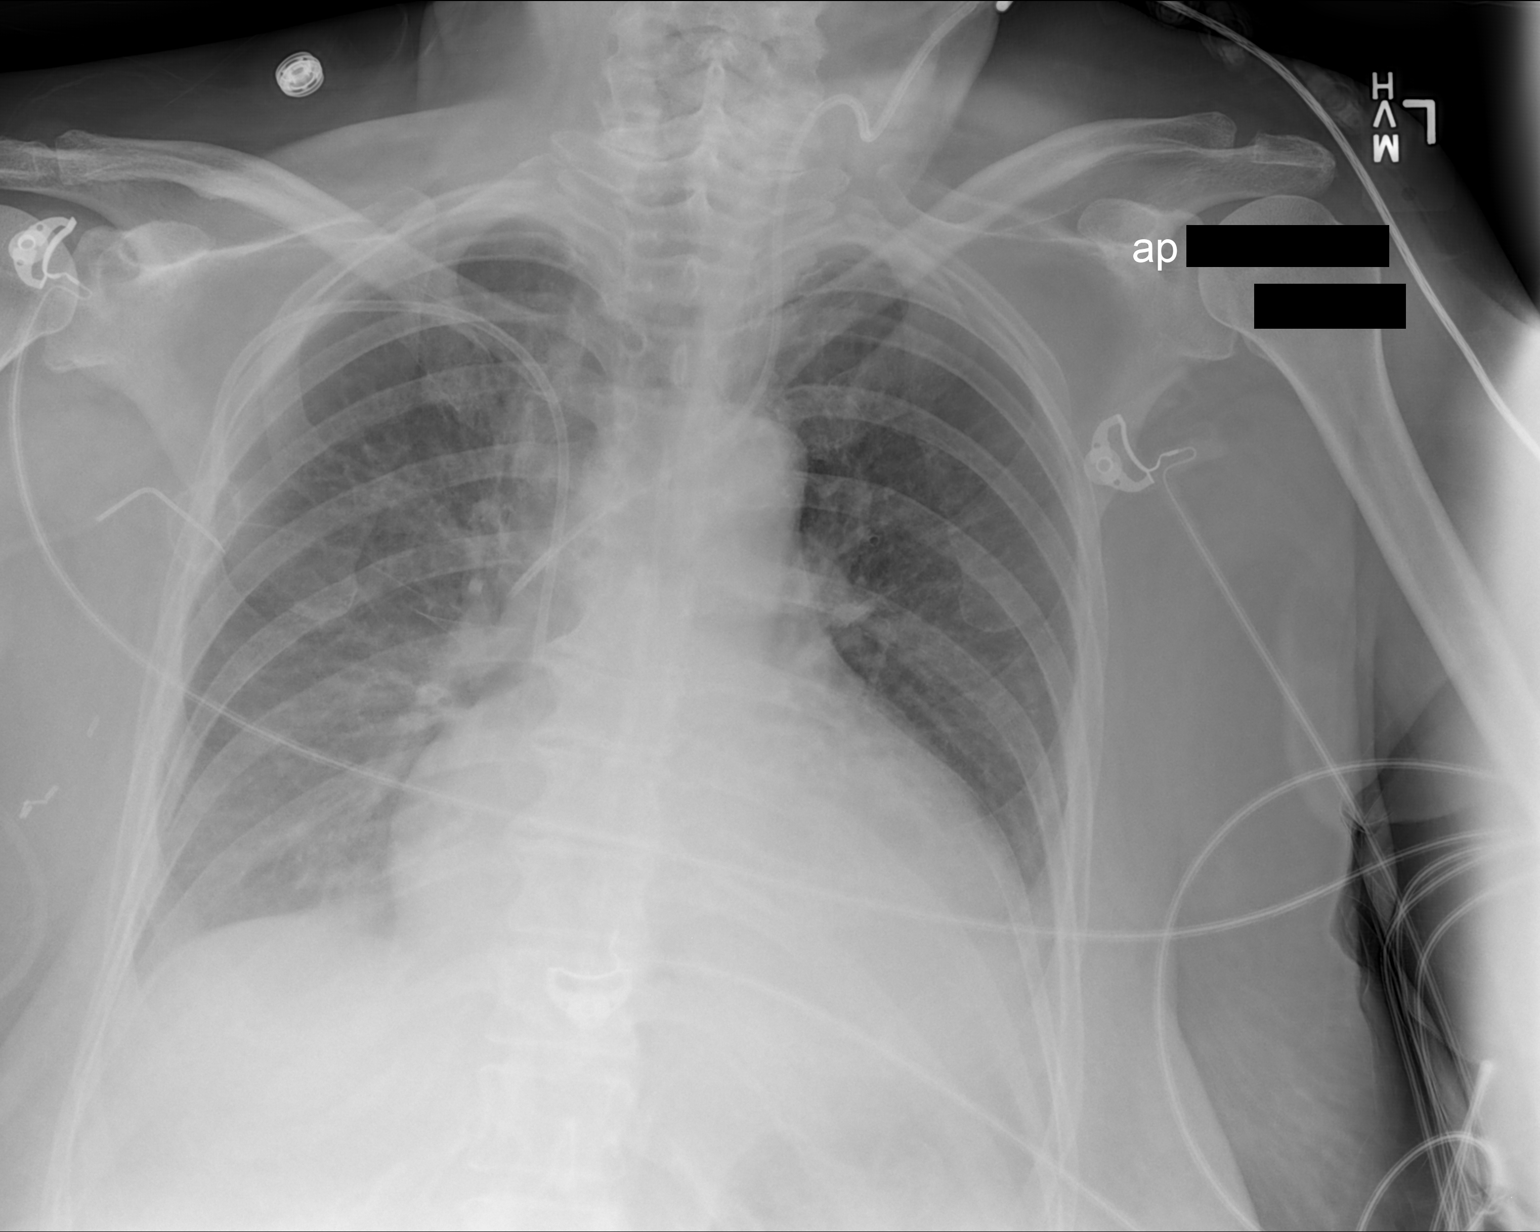

[1 of 1 positions shown; findings below may reference images not displayed]

FINDINGS: New left internal jugular central venous catheter tip in the
proximal SVC. There is no pneumothorax. The right chest port remains
in place, tip in the SVC. Cardiomegaly is unchanged. Hazy opacity at
both lung bases, left greater than right, likely effusions and not
significantly changed allowing for differences in technique. Stable
prominence of central pulmonary vasculature. No new airspace
disease.
IMPRESSION: Tip of the left internal jugular central venous catheter in the SVC.
No pneumothorax.

## 2016-04-17 DIAGNOSIS — I5022 Chronic systolic (congestive) heart failure: Secondary | ICD-10-CM | POA: Diagnosis not present

## 2016-04-17 IMAGING — US US ART/VEN ABD/PELV/SCROTUM DOPPLER LTD
1 series · 13 of 25 positions shown · non-contrast
Comparison: Renal ultrasound 08/28/2014; CT abdomen/ pelvis
08/26/2014

CLINICAL DATA: 68-year-old female with acute renal failure

EXAM:
DUPLEX ULTRASOUND OF LIVER
TECHNIQUE: Color and duplex Doppler ultrasound was performed to evaluate the
hepatic in-flow and out-flow vessels.

[Series 1: us art/ven abd/pelv/scrotum doppler ltd · 0.20mm/px · 13 of 28 slices shown]
[im 1/28]
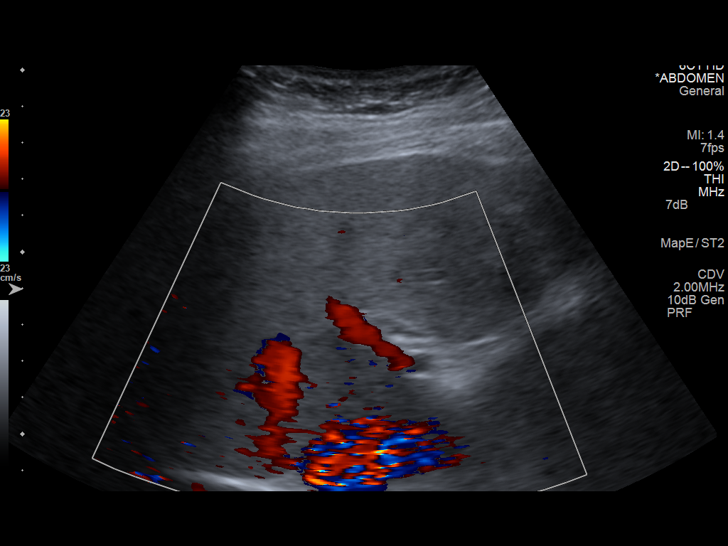
[im 3/28]
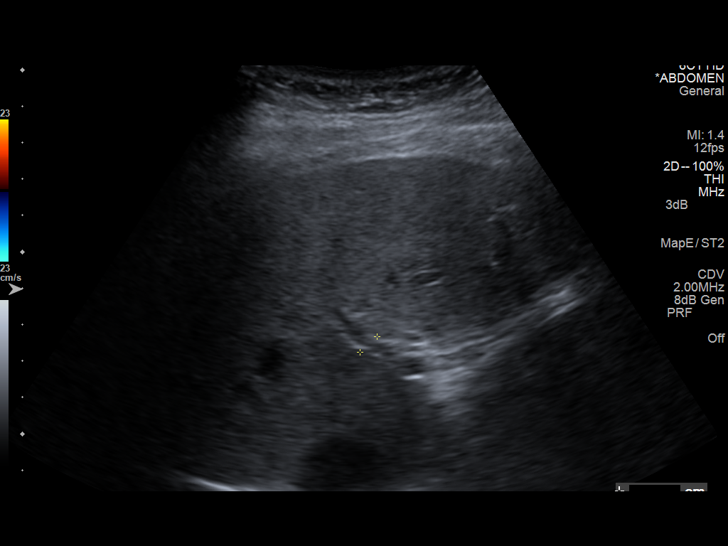
[im 5/28]
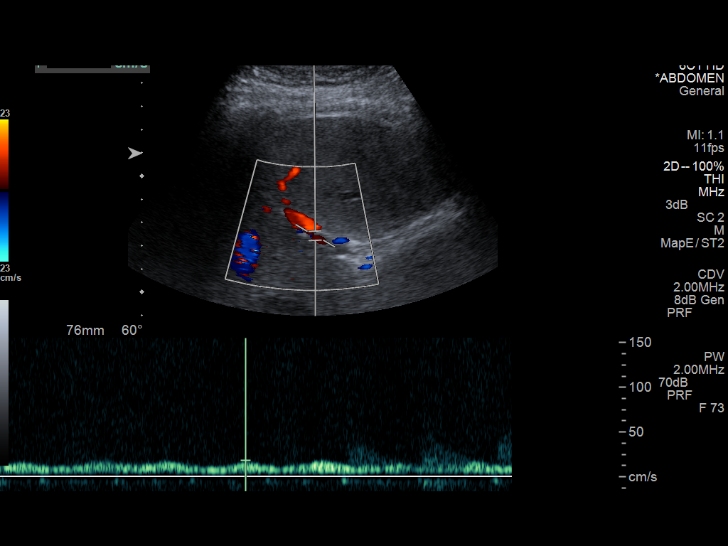
[im 7/28]
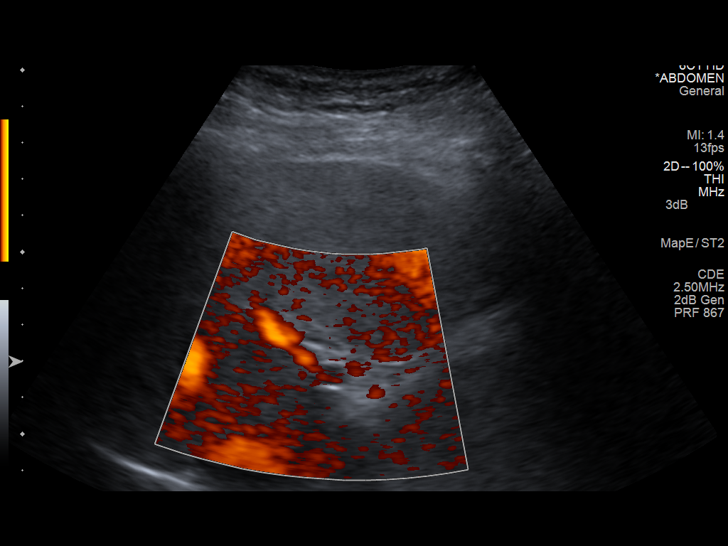
[im 10/28]
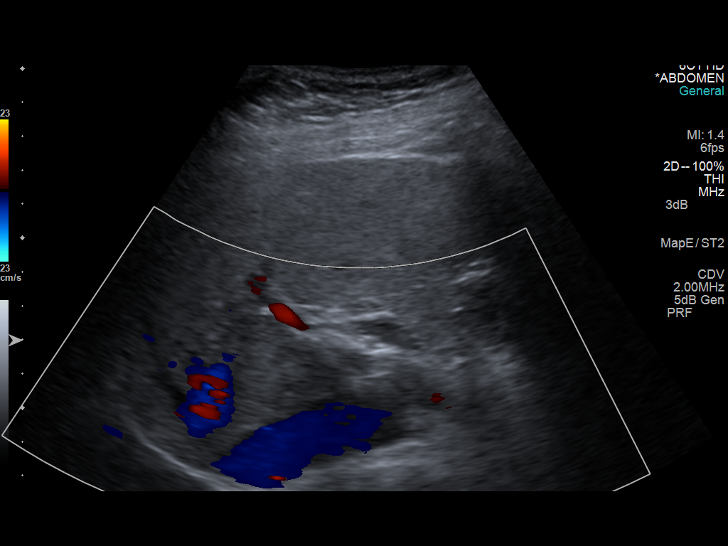
[im 12/28]
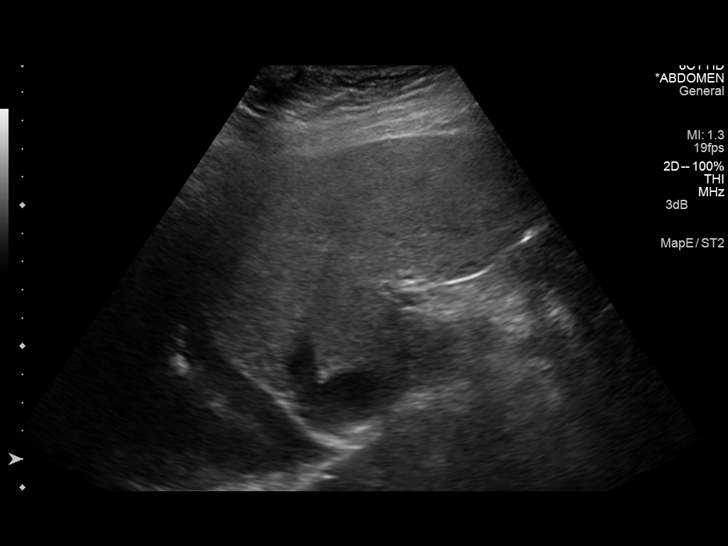
[im 14/28]
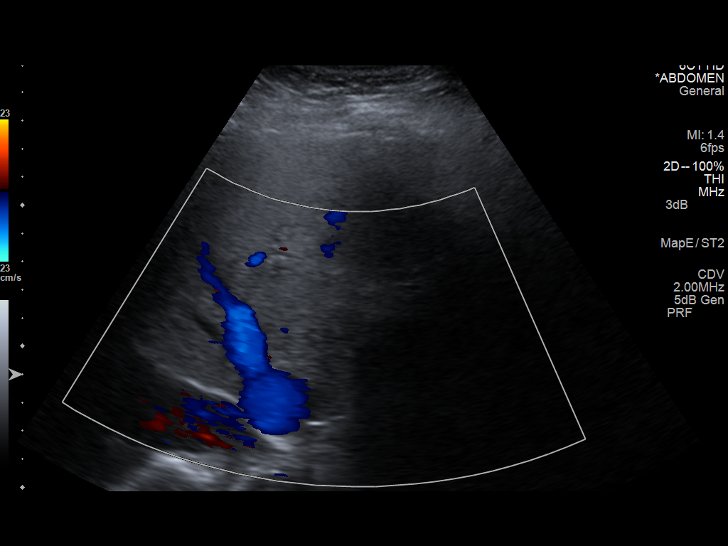
[im 16/28]
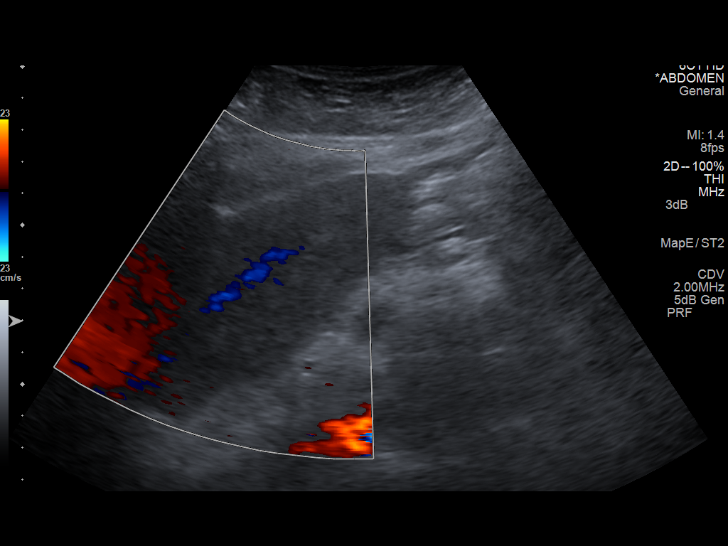
[im 19/28]
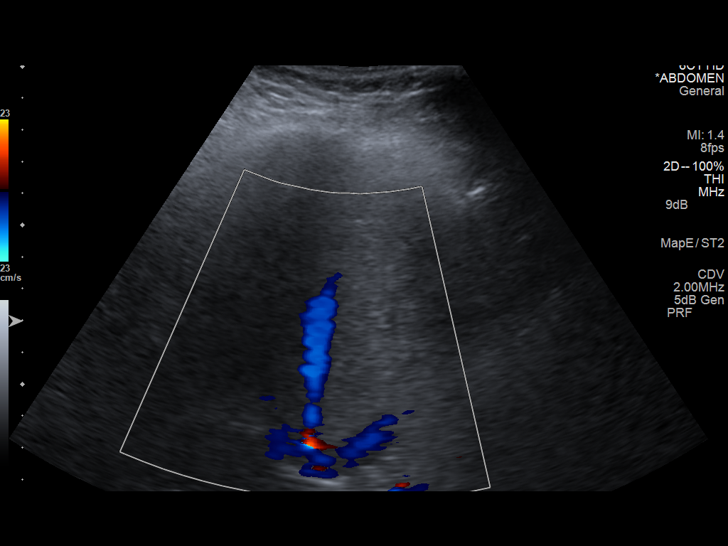
[im 21/28]
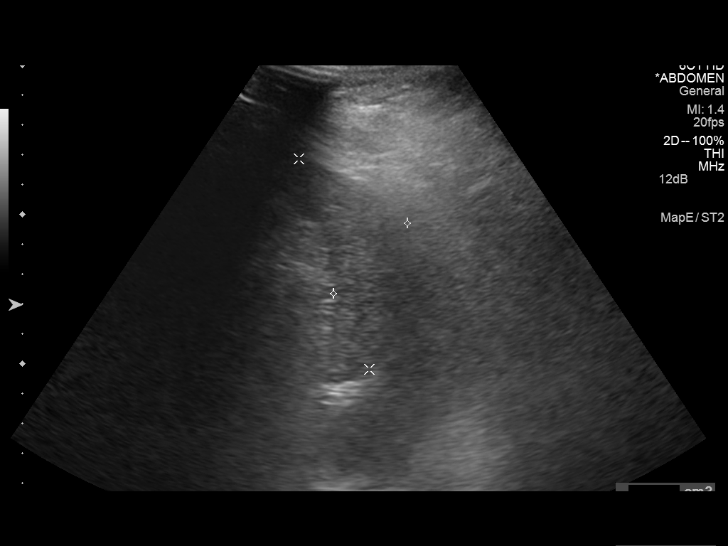
[im 23/28]
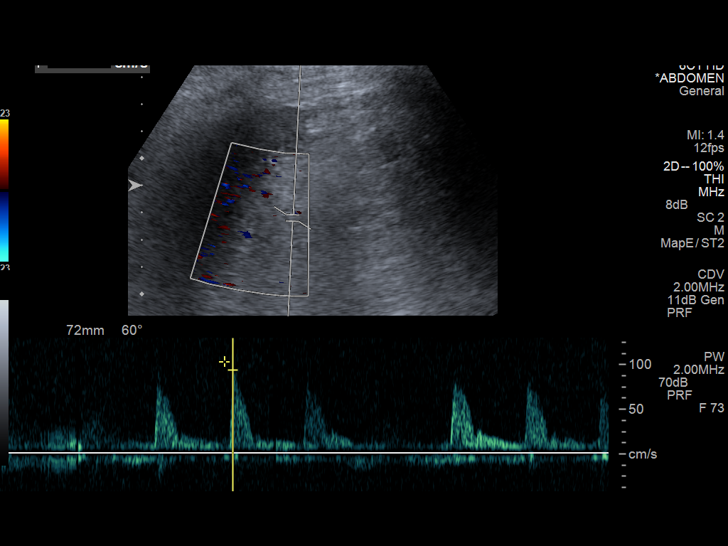
[im 25/28]
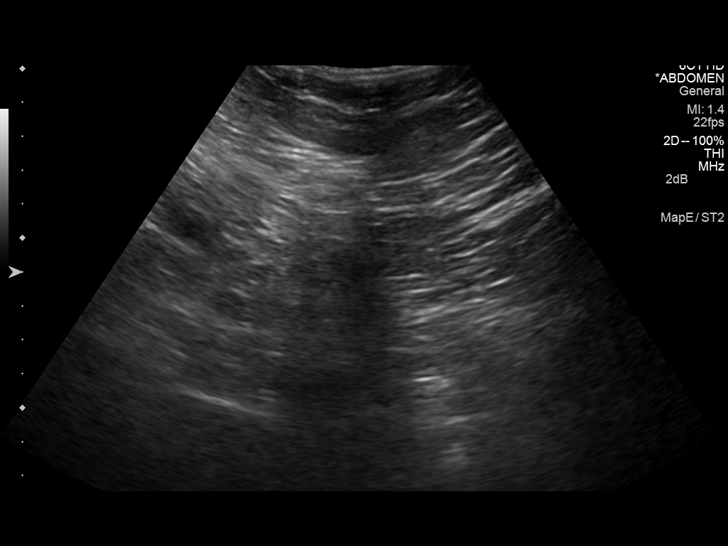
[im 28/28]
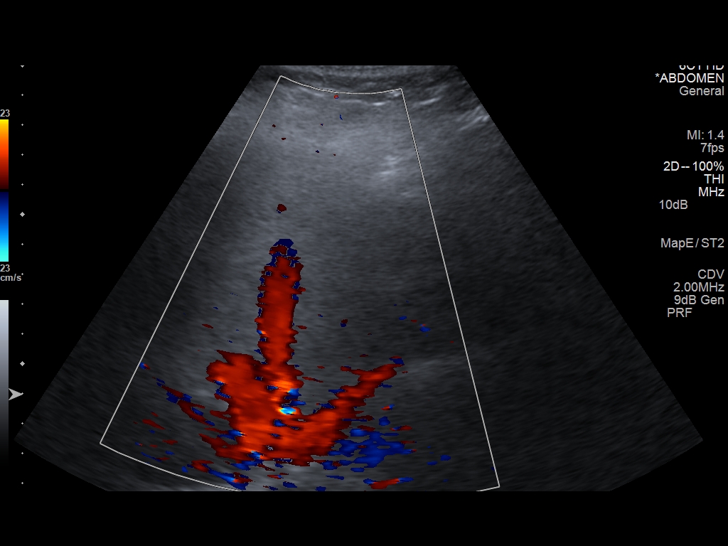

[13 of 25 positions shown; findings below may reference images not displayed]

FINDINGS: Portal Vein Velocities

Main:  18-27 cm/sec with normal hepatopetal flow.

Right:  Unable to visualize

Left:  Unable to visualize

Hepatic Vein Velocities

Right:  33 cm/sec

Middle:  38 cm/sec

Left:  29 cm/sec

Hepatic Artery Velocity:  69 cm/sec

Splenic Vein Velocity:  20 cm/sec, difficult to visualize.

Varices: None visualized.

Ascites: None.

Bilateral right larger than left pleural effusions. The liver is
echogenic suggesting fatty infiltration.
IMPRESSION: 1. Main portal vein is patent with normal hepatopetal flow.
Intrahepatic portal veins are difficult image secondary to a
combination of hepatic steatosis and patient body habitus. Given the
unremarkable appearance of the main portal vein, significant
intrahepatic portal venous thrombosis is considered somewhat
unlikely.
2. Hepatic steatosis.
3. Hepatic veins remain patent.
4. Right larger than left pleural effusions.

## 2016-04-19 DIAGNOSIS — I5022 Chronic systolic (congestive) heart failure: Secondary | ICD-10-CM | POA: Diagnosis not present

## 2016-04-21 DIAGNOSIS — Z4502 Encounter for adjustment and management of automatic implantable cardiac defibrillator: Secondary | ICD-10-CM | POA: Diagnosis not present

## 2016-04-21 DIAGNOSIS — I472 Ventricular tachycardia: Secondary | ICD-10-CM | POA: Diagnosis not present

## 2016-04-21 DIAGNOSIS — I420 Dilated cardiomyopathy: Secondary | ICD-10-CM | POA: Diagnosis not present

## 2016-04-21 DIAGNOSIS — I5022 Chronic systolic (congestive) heart failure: Secondary | ICD-10-CM | POA: Diagnosis not present

## 2016-04-24 DIAGNOSIS — I5022 Chronic systolic (congestive) heart failure: Secondary | ICD-10-CM | POA: Diagnosis not present

## 2016-04-26 DIAGNOSIS — I5022 Chronic systolic (congestive) heart failure: Secondary | ICD-10-CM | POA: Diagnosis not present

## 2016-04-28 DIAGNOSIS — I5022 Chronic systolic (congestive) heart failure: Secondary | ICD-10-CM | POA: Diagnosis not present

## 2016-05-01 DIAGNOSIS — C50919 Malignant neoplasm of unspecified site of unspecified female breast: Secondary | ICD-10-CM | POA: Diagnosis not present

## 2016-05-01 DIAGNOSIS — I10 Essential (primary) hypertension: Secondary | ICD-10-CM | POA: Diagnosis not present

## 2016-05-01 DIAGNOSIS — I509 Heart failure, unspecified: Secondary | ICD-10-CM | POA: Diagnosis not present

## 2016-05-01 DIAGNOSIS — I5022 Chronic systolic (congestive) heart failure: Secondary | ICD-10-CM | POA: Diagnosis not present

## 2016-05-03 DIAGNOSIS — I5022 Chronic systolic (congestive) heart failure: Secondary | ICD-10-CM | POA: Diagnosis not present

## 2016-05-05 DIAGNOSIS — I5022 Chronic systolic (congestive) heart failure: Secondary | ICD-10-CM | POA: Diagnosis not present

## 2016-05-10 DIAGNOSIS — I5022 Chronic systolic (congestive) heart failure: Secondary | ICD-10-CM | POA: Diagnosis not present

## 2016-05-12 DIAGNOSIS — I5022 Chronic systolic (congestive) heart failure: Secondary | ICD-10-CM | POA: Diagnosis not present

## 2016-05-22 DIAGNOSIS — I5022 Chronic systolic (congestive) heart failure: Secondary | ICD-10-CM | POA: Diagnosis not present

## 2016-05-23 DIAGNOSIS — Z853 Personal history of malignant neoplasm of breast: Secondary | ICD-10-CM | POA: Diagnosis not present

## 2016-05-23 DIAGNOSIS — F339 Major depressive disorder, recurrent, unspecified: Secondary | ICD-10-CM | POA: Diagnosis not present

## 2016-05-23 DIAGNOSIS — I4891 Unspecified atrial fibrillation: Secondary | ICD-10-CM | POA: Diagnosis not present

## 2016-05-23 DIAGNOSIS — G620 Drug-induced polyneuropathy: Secondary | ICD-10-CM | POA: Diagnosis not present

## 2016-05-23 DIAGNOSIS — I10 Essential (primary) hypertension: Secondary | ICD-10-CM | POA: Diagnosis not present

## 2016-05-23 DIAGNOSIS — I5042 Chronic combined systolic (congestive) and diastolic (congestive) heart failure: Secondary | ICD-10-CM | POA: Diagnosis not present

## 2016-05-23 DIAGNOSIS — Z760 Encounter for issue of repeat prescription: Secondary | ICD-10-CM | POA: Diagnosis not present

## 2016-05-23 DIAGNOSIS — R2689 Other abnormalities of gait and mobility: Secondary | ICD-10-CM | POA: Diagnosis not present

## 2016-05-24 DIAGNOSIS — I5022 Chronic systolic (congestive) heart failure: Secondary | ICD-10-CM | POA: Diagnosis not present

## 2016-05-26 DIAGNOSIS — I351 Nonrheumatic aortic (valve) insufficiency: Secondary | ICD-10-CM | POA: Diagnosis not present

## 2016-05-26 DIAGNOSIS — I340 Nonrheumatic mitral (valve) insufficiency: Secondary | ICD-10-CM | POA: Diagnosis not present

## 2016-05-26 DIAGNOSIS — I361 Nonrheumatic tricuspid (valve) insufficiency: Secondary | ICD-10-CM | POA: Diagnosis not present

## 2016-05-29 DIAGNOSIS — I5022 Chronic systolic (congestive) heart failure: Secondary | ICD-10-CM | POA: Diagnosis not present

## 2016-06-03 ENCOUNTER — Other Ambulatory Visit: Payer: Self-pay | Admitting: Nurse Practitioner

## 2016-06-05 DIAGNOSIS — I5022 Chronic systolic (congestive) heart failure: Secondary | ICD-10-CM | POA: Diagnosis not present

## 2016-06-07 DIAGNOSIS — I509 Heart failure, unspecified: Secondary | ICD-10-CM | POA: Diagnosis not present

## 2016-06-07 DIAGNOSIS — I5022 Chronic systolic (congestive) heart failure: Secondary | ICD-10-CM | POA: Diagnosis not present

## 2016-06-07 DIAGNOSIS — I10 Essential (primary) hypertension: Secondary | ICD-10-CM | POA: Diagnosis not present

## 2016-06-07 DIAGNOSIS — C50919 Malignant neoplasm of unspecified site of unspecified female breast: Secondary | ICD-10-CM | POA: Diagnosis not present

## 2016-06-09 DIAGNOSIS — I5022 Chronic systolic (congestive) heart failure: Secondary | ICD-10-CM | POA: Diagnosis not present

## 2016-06-12 DIAGNOSIS — I5022 Chronic systolic (congestive) heart failure: Secondary | ICD-10-CM | POA: Diagnosis not present

## 2016-06-14 DIAGNOSIS — I5022 Chronic systolic (congestive) heart failure: Secondary | ICD-10-CM | POA: Diagnosis not present

## 2016-06-19 DIAGNOSIS — I5022 Chronic systolic (congestive) heart failure: Secondary | ICD-10-CM | POA: Diagnosis not present

## 2016-06-21 DIAGNOSIS — I5022 Chronic systolic (congestive) heart failure: Secondary | ICD-10-CM | POA: Diagnosis not present

## 2016-06-23 DIAGNOSIS — I5022 Chronic systolic (congestive) heart failure: Secondary | ICD-10-CM | POA: Diagnosis not present

## 2016-06-26 DIAGNOSIS — I5022 Chronic systolic (congestive) heart failure: Secondary | ICD-10-CM | POA: Diagnosis not present

## 2016-06-27 DIAGNOSIS — I10 Essential (primary) hypertension: Secondary | ICD-10-CM | POA: Diagnosis not present

## 2016-06-27 DIAGNOSIS — I4891 Unspecified atrial fibrillation: Secondary | ICD-10-CM | POA: Diagnosis not present

## 2016-06-27 DIAGNOSIS — R2689 Other abnormalities of gait and mobility: Secondary | ICD-10-CM | POA: Diagnosis not present

## 2016-06-29 ENCOUNTER — Telehealth: Payer: Self-pay | Admitting: Hematology and Oncology

## 2016-06-29 NOTE — Telephone Encounter (Signed)
Pt called to r/s March appt to earlier due to lab results she received from her PCP. Gave pt new appt date/time per request

## 2016-06-30 ENCOUNTER — Encounter: Payer: Self-pay | Admitting: Hematology and Oncology

## 2016-07-05 DIAGNOSIS — I509 Heart failure, unspecified: Secondary | ICD-10-CM | POA: Diagnosis not present

## 2016-07-05 DIAGNOSIS — C50919 Malignant neoplasm of unspecified site of unspecified female breast: Secondary | ICD-10-CM | POA: Diagnosis not present

## 2016-07-05 DIAGNOSIS — I10 Essential (primary) hypertension: Secondary | ICD-10-CM | POA: Diagnosis not present

## 2016-07-06 ENCOUNTER — Ambulatory Visit (HOSPITAL_BASED_OUTPATIENT_CLINIC_OR_DEPARTMENT_OTHER): Payer: Medicare Other | Admitting: Hematology and Oncology

## 2016-07-06 ENCOUNTER — Ambulatory Visit (HOSPITAL_BASED_OUTPATIENT_CLINIC_OR_DEPARTMENT_OTHER): Payer: Medicare Other

## 2016-07-06 ENCOUNTER — Other Ambulatory Visit: Payer: Self-pay

## 2016-07-06 ENCOUNTER — Encounter: Payer: Self-pay | Admitting: Hematology and Oncology

## 2016-07-06 DIAGNOSIS — C50411 Malignant neoplasm of upper-outer quadrant of right female breast: Secondary | ICD-10-CM

## 2016-07-06 DIAGNOSIS — D72819 Decreased white blood cell count, unspecified: Secondary | ICD-10-CM | POA: Insufficient documentation

## 2016-07-06 DIAGNOSIS — Z171 Estrogen receptor negative status [ER-]: Principal | ICD-10-CM

## 2016-07-06 DIAGNOSIS — G620 Drug-induced polyneuropathy: Secondary | ICD-10-CM

## 2016-07-06 DIAGNOSIS — D708 Other neutropenia: Secondary | ICD-10-CM

## 2016-07-06 LAB — CBC WITH DIFFERENTIAL/PLATELET
BASO%: 0.8 % (ref 0.0–2.0)
BASOS ABS: 0 10*3/uL (ref 0.0–0.1)
EOS%: 4 % (ref 0.0–7.0)
Eosinophils Absolute: 0.1 10*3/uL (ref 0.0–0.5)
HEMATOCRIT: 41.8 % (ref 34.8–46.6)
HGB: 13.7 g/dL (ref 11.6–15.9)
LYMPH%: 27.8 % (ref 14.0–49.7)
MCH: 28.5 pg (ref 25.1–34.0)
MCHC: 32.8 g/dL (ref 31.5–36.0)
MCV: 86.9 fL (ref 79.5–101.0)
MONO#: 0.2 10*3/uL (ref 0.1–0.9)
MONO%: 15.2 % — ABNORMAL HIGH (ref 0.0–14.0)
NEUT#: 0.7 10*3/uL — ABNORMAL LOW (ref 1.5–6.5)
NEUT%: 52.2 % (ref 38.4–76.8)
PLATELETS: 68 10*3/uL — AB (ref 145–400)
RBC: 4.8 10*6/uL (ref 3.70–5.45)
RDW: 16 % — ABNORMAL HIGH (ref 11.2–14.5)
WBC: 1.3 10*3/uL — ABNORMAL LOW (ref 3.9–10.3)
lymph#: 0.4 10*3/uL — ABNORMAL LOW (ref 0.9–3.3)

## 2016-07-06 LAB — COMPREHENSIVE METABOLIC PANEL
ALT: 29 U/L (ref 0–55)
ANION GAP: 8 meq/L (ref 3–11)
AST: 35 U/L — ABNORMAL HIGH (ref 5–34)
Albumin: 3 g/dL — ABNORMAL LOW (ref 3.5–5.0)
Alkaline Phosphatase: 146 U/L (ref 40–150)
BUN: 23.4 mg/dL (ref 7.0–26.0)
CHLORIDE: 108 meq/L (ref 98–109)
CO2: 21 meq/L — AB (ref 22–29)
Calcium: 9.6 mg/dL (ref 8.4–10.4)
Creatinine: 1.4 mg/dL — ABNORMAL HIGH (ref 0.6–1.1)
EGFR: 37 mL/min/{1.73_m2} — ABNORMAL LOW (ref >90)
Glucose: 98 mg/dl (ref 70–140)
POTASSIUM: 4.2 meq/L (ref 3.5–5.1)
Sodium: 138 mEq/L (ref 136–145)
Total Bilirubin: 0.46 mg/dL (ref 0.20–1.20)
Total Protein: 8.3 g/dL (ref 6.4–8.3)

## 2016-07-06 MED ORDER — CARVEDILOL 25 MG PO TABS: 12.5000 mg | ORAL_TABLET | Freq: Two times a day (BID) | ORAL | 3 refills | Status: AC

## 2016-07-06 MED ORDER — GABAPENTIN 300 MG PO CAPS
300.0000 mg | ORAL_CAPSULE | Freq: Every day | ORAL | Status: AC
Start: 2016-07-06 — End: ?

## 2016-07-06 NOTE — Assessment & Plan Note (Signed)
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 status post adjuvant chemotherapy with dose dense Adriamycin and Cytoxan 4 starting 03/30/2014 followed by Abraxane weekly 7 discontinued 07/27/2014 due to severe toxicities and hospitalizations Chemotherapy toxicities include C. difficile, neutropenic fever, dental infection, SIRS, nausea, diarrhea, fatigue, anemia, hypokalemia, CHF) S/P XRT completed 02/08/2015  Recommendation: since she is ER/PR negative, there is no role of antiestrogen therapy. Congestive heart failure Atrial fibrillation Lower extremity embolism causing gangrene: On anticoagulation, continue with this indefinitely. 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  Breast Cancer Surveillance: 1. Breast exam 07/04/2016: Normal 2. Mammogram [facility] 04/21/15 scattered calcifications in both breast six-month follow-up mammogram was recommended Postsurgical changes. Breast Density Category C. patient will need a new mammogram   Patient plans to move to [facility] area to live in an assisted living facility. She will find a new oncologist closer to her new residential area to continue her surveillance.

## 2016-07-06 NOTE — Progress Notes (Signed)
Patient Care Team: Hulan Fess, MD as PCP - General (Family Medicine) Nicholas Lose, MD as Consulting Physician (Hematology and Oncology) Stark Klein, MD as Consulting Physician (General Surgery) Thea Silversmith, MD (Inactive) as Consulting Physician (Radiation Oncology) Sylvan Cheese, NP as Nurse Practitioner (Hematology and Oncology)  DIAGNOSIS:  Encounter Diagnoses  Name Primary?  . Malignant neoplasm of upper-outer quadrant of right breast in female, estrogen receptor negative ([place])   . Other neutropenia ([place])     SUMMARY OF ONCOLOGIC HISTORY:   Breast cancer of upper-outer quadrant of right female breast ([place])   01/27/2014 Mammogram    Right breast: possible mass measuring 1.6 cm at 11:00, 9 cm from nipple.  Additional imaging needed.      01/30/2014 Breast US    Right breast: New irregular equal density mass with indistinct margin at 11:00, middle depth, 9 cm from nipple, measruing 1.8 cm      02/05/2014 Initial Biopsy    Right breast core needle bx: Invasive ductal carcinoma, grade 2-3, ER- (0%), PR- (0%), HER2/neu negative (ratio 1.15), Ki67 81%      02/06/2014 Clinical Stage    Stage IA: T1c N0      02/27/2014 Definitive Surgery    Right breast lumpectomy/SLNB ([place]): Invasive ductal carcinoma grade 3, 2.5 cm, DCIS intermediate grade, margins negative, 2 SLN negative, ER/PR HER-2 negative      02/27/2014 Pathologic Stage    Stage IIA: T2 N0      03/30/2014 - 07/27/2014 Chemotherapy    Adjuvant chemotherapy with dose dense doxorubicin and cyclophosphamide x 4 cycles followed by nab-paclitaxel weekly x7/12 (stopped early due to hospitalizations and recurrent C. difficile)      05/17/2014 - 05/26/2014 Hospital Admission    Neutropenic fever secondary to dental infection which led to C. difficile enterocolitis, atypical pneumonia and SIRS      08/09/2014 - 08/10/2014 Hospital Admission    Hospitalization for nausea vomiting and dehydration related to  diarrhea from C. 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 Right breast boost / 16 Gray over 8 fractions. Total dose: 61 Gy      03/12/2015 Survivorship    Survivorship visit completed and given to patient.       CHIEF COMPLIANT: Follow-up of breast cancer and recent blood work showing pancytopenia  INTERVAL HISTORY: Cheyenne Riggs is a 71 year old with above-mentioned history of right breast cancer treated with surgery followed by adjuvant chemotherapy and radiation and is currently on surveillance. She was treated for triple negative breast cancer. She had moved to [facility] and had doctors making house calls visit her. Blood work done by them revealed that she had leukopenia and thrombocytopenia. Because of this she decided to come and be checked out with [place]. She feels completely normal. She has no symptoms or concerns.  REVIEW OF SYSTEMS:   Constitutional: Denies fevers, chills or abnormal weight loss Eyes: Denies blurriness of vision Ears, nose, mouth, throat, and face: Denies mucositis or sore throat Respiratory: Denies cough, dyspnea or wheezes Cardiovascular: Denies palpitation, chest discomfort Gastrointestinal:  Denies nausea, heartburn or change in bowel habits Skin: Denies abnormal skin rashes Lymphatics: Denies new lymphadenopathy or  easy bruising Neurological:Denies numbness, tingling or new weaknesses Behavioral/Psych: Mood is stable, no new changes  Extremities: No lower extremity edema Breast:  denies any pain or lumps or nodules in either breasts All other systems were reviewed with the patient and are negative.  I have reviewed the past medical history, past surgical history, social history  and family history with the patient and they are unchanged from previous note.  ALLERGIES:  is allergic to [employer] tartrate]; ciprofloxacin; demerol [meperidine]; lidocaine; other; septra [sulfamethoxazole-trimethoprim]; codeine; novocain [procaine]; and vancomycin.  MEDICATIONS:  Current Outpatient Prescriptions  Medication Sig Dispense Refill  . B Complex Vitamins (VITAMIN B COMPLEX PO) Take 1 tablet by mouth 3 (three) times daily.    . carvedilol (COREG) 25 MG tablet Take 0.5 tablets (12.5 mg total) by mouth 2 (two) times daily. 180 tablet 3  . ELIQUIS 5 MG TABS tablet TAKE 1 TABLET BY MOUTH TWICE A DAY 60 tablet 10  . FLUoxetine (PROZAC) 10 MG capsule Take 1 capsule (10 mg total) by mouth daily. (Patient taking differently: Take 20 mg by mouth 2 (two) times daily. ) 30 capsule 1  . furosemide (LASIX) 20 MG tablet Take 1 tablet (20 mg total) by mouth daily as needed (weight gain). 90 tablet 3  . gabapentin (NEURONTIN) 300 MG capsule Take 1 capsule (300 mg total) by mouth at bedtime.    Marland Kitchen LORazepam (ATIVAN) 0.5 MG tablet Take 1 tablet (0.5 mg total) by mouth every 8 (eight) hours as needed. for anxiety 30 tablet 0  . valsartan (DIOVAN) 160 MG tablet Take 1 tablet (160 mg total) by mouth daily. 90 tablet 3   No current facility-administered medications for this visit.     PHYSICAL EXAMINATION: ECOG PERFORMANCE STATUS: 1 - Symptomatic but completely ambulatory  Vitals:   07/06/16 0918  BP: (!) 123/50  Pulse: 75  Resp: 18  Temp: 97.5 F (36.4 C)   Filed Weights   07/06/16 0918  Weight: 193 lb 6.4 oz (87.7 kg)    GENERAL:alert, no distress and comfortable SKIN: skin color, texture, turgor are normal, no rashes or significant lesions EYES: normal, Conjunctiva are pink and non-injected, sclera clear OROPHARYNX:no exudate, no erythema and lips, buccal mucosa, and tongue normal  NECK: supple, thyroid normal size, non-tender, without nodularity LYMPH:  no palpable  lymphadenopathy in the cervical, axillary or inguinal LUNGS: clear to auscultation and percussion with normal breathing effort HEART: Occasionally irregular ABDOMEN:abdomen soft, non-tender and normal bowel sounds MUSCULOSKELETAL:no cyanosis of digits and no clubbing  NEURO: alert & oriented x 3 with fluent speech, no focal motor/sensory deficits EXTREMITIES: No lower extremity edema  LABORATORY DATA:  I have reviewed the data as listed   Chemistry      Component Value Date/Time   NA 138 07/06/2016 0952   K 4.2 07/06/2016 0952   CL 102 06/28/2015 0948   CO2 21 (L) 07/06/2016 0952   BUN 23.4 07/06/2016 0952   CREATININE 1.4 (H) 07/06/2016 0952      Component Value Date/Time   CALCIUM 9.6 07/06/2016 0952   ALKPHOS 146 07/06/2016 0952   AST 35 (H) 07/06/2016 0952   ALT 29 07/06/2016 0952   BILITOT 0.46 07/06/2016 0952       Lab Results  Component Value Date   WBC 1.3 (L) 07/06/2016   HGB 13.7 07/06/2016   HCT 41.8 07/06/2016   MCV 86.9 07/06/2016   PLT 68 (L) 07/06/2016   NEUTROABS 0.7 (L) 07/06/2016    ASSESSMENT &  PLAN:  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 status post adjuvant chemotherapy with dose dense Adriamycin and Cytoxan 4 starting 03/30/2014 followed by Abraxane weekly 7 discontinued 07/27/2014 due to severe toxicities and hospitalizations Chemotherapy toxicities include C. difficile, neutropenic fever, dental infection, SIRS, nausea, diarrhea, fatigue, anemia, hypokalemia, CHF) S/P XRT completed 02/08/2015  Recommendation: since she is ER/PR negative, there is no role of antiestrogen therapy. Congestive heart failure: Has a pacemaker and defibrillator Atrial fibrillation Lower extremity embolism causing gangrene: On anticoagulation, continue with this indefinitely. 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  Breast Cancer Surveillance: 1. Breast exam 07/04/2016: Normal 2. Mammogram [facility] 04/21/15 scattered calcifications in both breast six-month follow-up mammogram was recommended Postsurgical changes. Breast Density Category C. patient will need a new mammogram ---------------------------------------------------------------------------------------------------------------------------------------------------------------------------------  Patient moved to [facility] area to live in an assisted living facility. She had a visit by Drs. making housecall who performed blood work that revealed severe cytopenias. She came in here today to be rechecked. She feels perfectly normal. Denies any symptoms at all. Blood work done today revealed a white count of 1.3 with an [place] of 700. She also had a platelet count of 68.  I discussed with her that she needs a bone marrow biopsy to evaluate the cause of the cytopenia. She tells me that she lives in [place] and is scheduled to see a hematologist oncologist over there. She wants to undergo the workup in [place] because she cannot make these trips back and forth.  I discussed with her that we are happy to see her back if necessary.  I spent 25 minutes talking to the patient of which more than half was spent in counseling and coordination of care.  Orders Placed This Encounter  Procedures  . CBC with Differential    Standing Status:   Future    Number of Occurrences:   1    Standing Expiration Date:   07/06/2017  . Comprehensive metabolic panel    Standing Status:   Future    Number of Occurrences:   1    Standing Expiration Date:   07/06/2017   The patient has a good understanding of the overall plan. she agrees with it. she will call with any problems that may develop before the next visit here.   Rulon Eisenmenger, MD 07/06/16

## 2016-07-10 ENCOUNTER — Telehealth: Payer: Self-pay | Admitting: Hematology and Oncology

## 2016-07-10 DIAGNOSIS — I420 Dilated cardiomyopathy: Secondary | ICD-10-CM | POA: Diagnosis not present

## 2016-07-10 DIAGNOSIS — Z4502 Encounter for adjustment and management of automatic implantable cardiac defibrillator: Secondary | ICD-10-CM | POA: Diagnosis not present

## 2016-07-10 DIAGNOSIS — I5023 Acute on chronic systolic (congestive) heart failure: Secondary | ICD-10-CM | POA: Diagnosis not present

## 2016-07-10 DIAGNOSIS — I10 Essential (primary) hypertension: Secondary | ICD-10-CM | POA: Diagnosis not present

## 2016-07-10 NOTE — Telephone Encounter (Signed)
Faxed records to Dr. Laveda Norman [phone]. Waiting for appt.

## 2016-07-12 DIAGNOSIS — I5022 Chronic systolic (congestive) heart failure: Secondary | ICD-10-CM | POA: Diagnosis not present

## 2016-07-12 NOTE — Progress Notes (Signed)
Spoke with pt over the phone to clarify that her medical records were faxed over to Dr.Graham's office on 2/26. Currently waiting for an appt for pt. Pt states that she didn't hear anything yet, and wanted to make sure that her medical records were sent over so they can set up her appointment. Reassured pt that referral is being handled. Pt should expect a phone call within the next week or 2. If she does not hear anything, advised to follow up at Dr.Graham's office. Pt grateful and appreciative of time.

## 2016-07-14 DIAGNOSIS — I5022 Chronic systolic (congestive) heart failure: Secondary | ICD-10-CM | POA: Diagnosis not present

## 2016-07-17 ENCOUNTER — Telehealth: Payer: Self-pay | Admitting: Hematology and Oncology

## 2016-07-17 DIAGNOSIS — I5022 Chronic systolic (congestive) heart failure: Secondary | ICD-10-CM | POA: Diagnosis not present

## 2016-07-17 NOTE — Telephone Encounter (Signed)
Pt appt. With Dr.Mark Phillip Heal is  07/26/16 @ 12:00.  Pt is aware.

## 2016-07-19 ENCOUNTER — Telehealth: Payer: Self-pay | Admitting: Emergency Medicine

## 2016-07-19 DIAGNOSIS — Z95810 Presence of automatic (implantable) cardiac defibrillator: Secondary | ICD-10-CM | POA: Diagnosis not present

## 2016-07-19 DIAGNOSIS — I420 Dilated cardiomyopathy: Secondary | ICD-10-CM | POA: Diagnosis not present

## 2016-07-19 NOTE — Telephone Encounter (Signed)
Spoke with patient; patient is having a dental bridge placed today; patient states she has an upcoming appointment with a hem/onc doctor in [place] next week with a BMX scheduled there as well; patient advised to call if any problems occur today at the dentist office.   Any letter the dentist may need can be faxed to [phone] and office number [phone]

## 2016-07-25 DIAGNOSIS — Z853 Personal history of malignant neoplasm of breast: Secondary | ICD-10-CM | POA: Diagnosis not present

## 2016-07-25 DIAGNOSIS — M40204 Unspecified kyphosis, thoracic region: Secondary | ICD-10-CM | POA: Diagnosis not present

## 2016-07-25 DIAGNOSIS — C50919 Malignant neoplasm of unspecified site of unspecified female breast: Secondary | ICD-10-CM | POA: Diagnosis not present

## 2016-07-26 DIAGNOSIS — D72819 Decreased white blood cell count, unspecified: Secondary | ICD-10-CM | POA: Diagnosis not present

## 2016-07-26 DIAGNOSIS — D649 Anemia, unspecified: Secondary | ICD-10-CM | POA: Diagnosis not present

## 2016-07-26 DIAGNOSIS — R5383 Other fatigue: Secondary | ICD-10-CM | POA: Diagnosis not present

## 2016-07-26 DIAGNOSIS — R718 Other abnormality of red blood cells: Secondary | ICD-10-CM | POA: Diagnosis not present

## 2016-07-26 DIAGNOSIS — D696 Thrombocytopenia, unspecified: Secondary | ICD-10-CM | POA: Diagnosis not present

## 2016-07-26 DIAGNOSIS — D539 Nutritional anemia, unspecified: Secondary | ICD-10-CM | POA: Diagnosis not present

## 2016-07-26 DIAGNOSIS — C50111 Malignant neoplasm of central portion of right female breast: Secondary | ICD-10-CM | POA: Diagnosis not present

## 2016-07-26 DIAGNOSIS — Z853 Personal history of malignant neoplasm of breast: Secondary | ICD-10-CM | POA: Diagnosis not present

## 2016-08-09 ENCOUNTER — Ambulatory Visit: Payer: Medicare Other | Admitting: Hematology and Oncology

## 2016-08-09 DIAGNOSIS — D696 Thrombocytopenia, unspecified: Secondary | ICD-10-CM | POA: Diagnosis not present

## 2016-08-09 DIAGNOSIS — C50111 Malignant neoplasm of central portion of right female breast: Secondary | ICD-10-CM | POA: Diagnosis not present

## 2016-08-09 DIAGNOSIS — D72819 Decreased white blood cell count, unspecified: Secondary | ICD-10-CM | POA: Diagnosis not present

## 2016-09-08 ENCOUNTER — Other Ambulatory Visit: Payer: Self-pay | Admitting: Cardiovascular Disease

## 2016-09-18 DIAGNOSIS — I10 Essential (primary) hypertension: Secondary | ICD-10-CM | POA: Diagnosis not present

## 2016-09-18 DIAGNOSIS — L988 Other specified disorders of the skin and subcutaneous tissue: Secondary | ICD-10-CM | POA: Diagnosis not present

## 2016-09-18 DIAGNOSIS — I5042 Chronic combined systolic (congestive) and diastolic (congestive) heart failure: Secondary | ICD-10-CM | POA: Diagnosis not present

## 2016-09-18 DIAGNOSIS — I4891 Unspecified atrial fibrillation: Secondary | ICD-10-CM | POA: Diagnosis not present

## 2016-10-05 DIAGNOSIS — E872 Acidosis: Secondary | ICD-10-CM | POA: Diagnosis not present

## 2016-10-05 DIAGNOSIS — K639 Disease of intestine, unspecified: Secondary | ICD-10-CM | POA: Diagnosis not present

## 2016-10-05 DIAGNOSIS — G936 Cerebral edema: Secondary | ICD-10-CM | POA: Diagnosis not present

## 2016-10-05 DIAGNOSIS — L539 Erythematous condition, unspecified: Secondary | ICD-10-CM | POA: Diagnosis not present

## 2016-10-05 DIAGNOSIS — I5022 Chronic systolic (congestive) heart failure: Secondary | ICD-10-CM | POA: Diagnosis present

## 2016-10-05 DIAGNOSIS — I4891 Unspecified atrial fibrillation: Secondary | ICD-10-CM | POA: Diagnosis not present

## 2016-10-05 DIAGNOSIS — R938 Abnormal findings on diagnostic imaging of other specified body structures: Secondary | ICD-10-CM | POA: Diagnosis not present

## 2016-10-05 DIAGNOSIS — J439 Emphysema, unspecified: Secondary | ICD-10-CM | POA: Diagnosis present

## 2016-10-05 DIAGNOSIS — I480 Paroxysmal atrial fibrillation: Secondary | ICD-10-CM | POA: Diagnosis present

## 2016-10-05 DIAGNOSIS — K529 Noninfective gastroenteritis and colitis, unspecified: Secondary | ICD-10-CM | POA: Diagnosis not present

## 2016-10-05 DIAGNOSIS — I509 Heart failure, unspecified: Secondary | ICD-10-CM | POA: Diagnosis not present

## 2016-10-05 DIAGNOSIS — E871 Hypo-osmolality and hyponatremia: Secondary | ICD-10-CM | POA: Diagnosis present

## 2016-10-05 DIAGNOSIS — I472 Ventricular tachycardia: Secondary | ICD-10-CM | POA: Diagnosis not present

## 2016-10-05 DIAGNOSIS — R9431 Abnormal electrocardiogram [ECG] [EKG]: Secondary | ICD-10-CM | POA: Diagnosis not present

## 2016-10-05 DIAGNOSIS — R4701 Aphasia: Secondary | ICD-10-CM | POA: Diagnosis present

## 2016-10-05 DIAGNOSIS — I619 Nontraumatic intracerebral hemorrhage, unspecified: Secondary | ICD-10-CM | POA: Diagnosis not present

## 2016-10-05 DIAGNOSIS — I130 Hypertensive heart and chronic kidney disease with heart failure and stage 1 through stage 4 chronic kidney disease, or unspecified chronic kidney disease: Secondary | ICD-10-CM | POA: Diagnosis not present

## 2016-10-05 DIAGNOSIS — I612 Nontraumatic intracerebral hemorrhage in hemisphere, unspecified: Secondary | ICD-10-CM | POA: Diagnosis not present

## 2016-10-05 DIAGNOSIS — N178 Other acute kidney failure: Secondary | ICD-10-CM | POA: Diagnosis not present

## 2016-10-05 DIAGNOSIS — B001 Herpesviral vesicular dermatitis: Secondary | ICD-10-CM | POA: Diagnosis not present

## 2016-10-05 DIAGNOSIS — D696 Thrombocytopenia, unspecified: Secondary | ICD-10-CM | POA: Diagnosis present

## 2016-10-05 DIAGNOSIS — N183 Chronic kidney disease, stage 3 (moderate): Secondary | ICD-10-CM | POA: Diagnosis present

## 2016-10-05 DIAGNOSIS — K6389 Other specified diseases of intestine: Secondary | ICD-10-CM | POA: Diagnosis not present

## 2016-10-05 DIAGNOSIS — E669 Obesity, unspecified: Secondary | ICD-10-CM | POA: Diagnosis present

## 2016-10-05 DIAGNOSIS — D72819 Decreased white blood cell count, unspecified: Secondary | ICD-10-CM | POA: Diagnosis present

## 2016-10-05 DIAGNOSIS — Z853 Personal history of malignant neoplasm of breast: Secondary | ICD-10-CM | POA: Diagnosis not present

## 2016-10-05 DIAGNOSIS — Z87891 Personal history of nicotine dependence: Secondary | ICD-10-CM | POA: Diagnosis not present

## 2016-10-05 DIAGNOSIS — G629 Polyneuropathy, unspecified: Secondary | ICD-10-CM | POA: Diagnosis present

## 2016-10-05 DIAGNOSIS — Z881 Allergy status to other antibiotic agents status: Secondary | ICD-10-CM | POA: Diagnosis not present

## 2016-10-05 DIAGNOSIS — K5730 Diverticulosis of large intestine without perforation or abscess without bleeding: Secondary | ICD-10-CM | POA: Diagnosis present

## 2016-10-05 DIAGNOSIS — K644 Residual hemorrhoidal skin tags: Secondary | ICD-10-CM | POA: Diagnosis present

## 2016-10-05 DIAGNOSIS — N179 Acute kidney failure, unspecified: Secondary | ICD-10-CM | POA: Diagnosis not present

## 2016-10-05 DIAGNOSIS — A09 Infectious gastroenteritis and colitis, unspecified: Secondary | ICD-10-CM | POA: Diagnosis present

## 2016-10-05 DIAGNOSIS — I618 Other nontraumatic intracerebral hemorrhage: Secondary | ICD-10-CM | POA: Diagnosis not present

## 2016-10-05 DIAGNOSIS — Z89429 Acquired absence of other toe(s), unspecified side: Secondary | ICD-10-CM | POA: Diagnosis not present

## 2016-10-05 DIAGNOSIS — A6004 Herpesviral vulvovaginitis: Secondary | ICD-10-CM | POA: Diagnosis not present

## 2016-10-05 DIAGNOSIS — F419 Anxiety disorder, unspecified: Secondary | ICD-10-CM | POA: Diagnosis present

## 2016-10-05 DIAGNOSIS — D708 Other neutropenia: Secondary | ICD-10-CM | POA: Diagnosis not present

## 2016-10-05 DIAGNOSIS — R4182 Altered mental status, unspecified: Secondary | ICD-10-CM | POA: Diagnosis not present

## 2016-10-05 DIAGNOSIS — I110 Hypertensive heart disease with heart failure: Secondary | ICD-10-CM | POA: Diagnosis not present

## 2016-10-05 DIAGNOSIS — Z95810 Presence of automatic (implantable) cardiac defibrillator: Secondary | ICD-10-CM | POA: Diagnosis not present

## 2016-10-05 DIAGNOSIS — I10 Essential (primary) hypertension: Secondary | ICD-10-CM | POA: Diagnosis not present

## 2016-10-05 DIAGNOSIS — Z7901 Long term (current) use of anticoagulants: Secondary | ICD-10-CM | POA: Diagnosis not present

## 2016-10-05 DIAGNOSIS — R188 Other ascites: Secondary | ICD-10-CM | POA: Diagnosis not present

## 2016-10-05 DIAGNOSIS — I6200 Nontraumatic subdural hemorrhage, unspecified: Secondary | ICD-10-CM | POA: Diagnosis not present

## 2016-10-05 DIAGNOSIS — R4702 Dysphasia: Secondary | ICD-10-CM | POA: Diagnosis not present

## 2016-10-05 DIAGNOSIS — F329 Major depressive disorder, single episode, unspecified: Secondary | ICD-10-CM | POA: Diagnosis present

## 2016-10-05 DIAGNOSIS — S06360A Traumatic hemorrhage of cerebrum, unspecified, without loss of consciousness, initial encounter: Secondary | ICD-10-CM | POA: Diagnosis not present

## 2016-10-05 DIAGNOSIS — I420 Dilated cardiomyopathy: Secondary | ICD-10-CM | POA: Diagnosis present

## 2016-10-05 DIAGNOSIS — Z79899 Other long term (current) drug therapy: Secondary | ICD-10-CM | POA: Diagnosis not present

## 2016-10-13 DIAGNOSIS — I959 Hypotension, unspecified: Secondary | ICD-10-CM | POA: Diagnosis not present

## 2016-10-13 DIAGNOSIS — D6959 Other secondary thrombocytopenia: Secondary | ICD-10-CM | POA: Diagnosis present

## 2016-10-13 DIAGNOSIS — R269 Unspecified abnormalities of gait and mobility: Secondary | ICD-10-CM | POA: Diagnosis present

## 2016-10-13 DIAGNOSIS — N390 Urinary tract infection, site not specified: Secondary | ICD-10-CM | POA: Diagnosis not present

## 2016-10-13 DIAGNOSIS — I69190 Apraxia following nontraumatic intracerebral hemorrhage: Secondary | ICD-10-CM | POA: Diagnosis not present

## 2016-10-13 DIAGNOSIS — N170 Acute kidney failure with tubular necrosis: Secondary | ICD-10-CM | POA: Diagnosis not present

## 2016-10-13 DIAGNOSIS — I612 Nontraumatic intracerebral hemorrhage in hemisphere, unspecified: Secondary | ICD-10-CM | POA: Diagnosis not present

## 2016-10-13 DIAGNOSIS — R262 Difficulty in walking, not elsewhere classified: Secondary | ICD-10-CM | POA: Diagnosis present

## 2016-10-13 DIAGNOSIS — D696 Thrombocytopenia, unspecified: Secondary | ICD-10-CM | POA: Diagnosis not present

## 2016-10-13 DIAGNOSIS — Z853 Personal history of malignant neoplasm of breast: Secondary | ICD-10-CM | POA: Diagnosis not present

## 2016-10-13 DIAGNOSIS — R938 Abnormal findings on diagnostic imaging of other specified body structures: Secondary | ICD-10-CM | POA: Diagnosis not present

## 2016-10-13 DIAGNOSIS — D709 Neutropenia, unspecified: Secondary | ICD-10-CM | POA: Diagnosis not present

## 2016-10-13 DIAGNOSIS — R109 Unspecified abdominal pain: Secondary | ICD-10-CM | POA: Diagnosis not present

## 2016-10-13 DIAGNOSIS — R531 Weakness: Secondary | ICD-10-CM | POA: Diagnosis present

## 2016-10-13 DIAGNOSIS — R6521 Severe sepsis with septic shock: Secondary | ICD-10-CM | POA: Diagnosis not present

## 2016-10-13 DIAGNOSIS — D701 Agranulocytosis secondary to cancer chemotherapy: Secondary | ICD-10-CM | POA: Diagnosis present

## 2016-10-13 DIAGNOSIS — N183 Chronic kidney disease, stage 3 (moderate): Secondary | ICD-10-CM | POA: Diagnosis present

## 2016-10-13 DIAGNOSIS — A4101 Sepsis due to Methicillin susceptible Staphylococcus aureus: Secondary | ICD-10-CM | POA: Diagnosis not present

## 2016-10-13 DIAGNOSIS — T451X5D Adverse effect of antineoplastic and immunosuppressive drugs, subsequent encounter: Secondary | ICD-10-CM | POA: Diagnosis not present

## 2016-10-13 DIAGNOSIS — I480 Paroxysmal atrial fibrillation: Secondary | ICD-10-CM | POA: Diagnosis present

## 2016-10-13 DIAGNOSIS — I369 Nonrheumatic tricuspid valve disorder, unspecified: Secondary | ICD-10-CM | POA: Diagnosis not present

## 2016-10-13 DIAGNOSIS — M25511 Pain in right shoulder: Secondary | ICD-10-CM | POA: Diagnosis not present

## 2016-10-13 DIAGNOSIS — I611 Nontraumatic intracerebral hemorrhage in hemisphere, cortical: Secondary | ICD-10-CM | POA: Diagnosis not present

## 2016-10-13 DIAGNOSIS — I130 Hypertensive heart and chronic kidney disease with heart failure and stage 1 through stage 4 chronic kidney disease, or unspecified chronic kidney disease: Secondary | ICD-10-CM | POA: Diagnosis present

## 2016-10-13 DIAGNOSIS — I5030 Unspecified diastolic (congestive) heart failure: Secondary | ICD-10-CM | POA: Diagnosis present

## 2016-10-13 DIAGNOSIS — Z89429 Acquired absence of other toe(s), unspecified side: Secondary | ICD-10-CM | POA: Diagnosis not present

## 2016-10-13 DIAGNOSIS — I69120 Aphasia following nontraumatic intracerebral hemorrhage: Secondary | ICD-10-CM | POA: Diagnosis not present

## 2016-10-13 DIAGNOSIS — I10 Essential (primary) hypertension: Secondary | ICD-10-CM | POA: Diagnosis not present

## 2016-10-13 DIAGNOSIS — L308 Other specified dermatitis: Secondary | ICD-10-CM | POA: Diagnosis not present

## 2016-10-13 DIAGNOSIS — Z87891 Personal history of nicotine dependence: Secondary | ICD-10-CM | POA: Diagnosis not present

## 2016-10-13 DIAGNOSIS — T827XXA Infection and inflammatory reaction due to other cardiac and vascular devices, implants and grafts, initial encounter: Secondary | ICD-10-CM | POA: Diagnosis not present

## 2016-10-13 DIAGNOSIS — I619 Nontraumatic intracerebral hemorrhage, unspecified: Secondary | ICD-10-CM | POA: Diagnosis not present

## 2016-10-13 DIAGNOSIS — A419 Sepsis, unspecified organism: Secondary | ICD-10-CM | POA: Diagnosis not present

## 2016-10-13 DIAGNOSIS — R0902 Hypoxemia: Secondary | ICD-10-CM | POA: Diagnosis not present

## 2016-10-13 DIAGNOSIS — R945 Abnormal results of liver function studies: Secondary | ICD-10-CM | POA: Diagnosis not present

## 2016-10-13 DIAGNOSIS — I5023 Acute on chronic systolic (congestive) heart failure: Secondary | ICD-10-CM | POA: Diagnosis not present

## 2016-10-13 DIAGNOSIS — N178 Other acute kidney failure: Secondary | ICD-10-CM | POA: Diagnosis not present

## 2016-10-13 DIAGNOSIS — R0602 Shortness of breath: Secondary | ICD-10-CM | POA: Diagnosis not present

## 2016-10-13 DIAGNOSIS — G936 Cerebral edema: Secondary | ICD-10-CM | POA: Diagnosis not present

## 2016-10-13 DIAGNOSIS — K5900 Constipation, unspecified: Secondary | ICD-10-CM | POA: Diagnosis present

## 2016-10-13 DIAGNOSIS — I639 Cerebral infarction, unspecified: Secondary | ICD-10-CM | POA: Diagnosis not present

## 2016-10-13 DIAGNOSIS — I2510 Atherosclerotic heart disease of native coronary artery without angina pectoris: Secondary | ICD-10-CM | POA: Diagnosis present

## 2016-10-13 DIAGNOSIS — B009 Herpesviral infection, unspecified: Secondary | ICD-10-CM | POA: Diagnosis not present

## 2016-10-13 DIAGNOSIS — I629 Nontraumatic intracranial hemorrhage, unspecified: Secondary | ICD-10-CM | POA: Diagnosis not present

## 2016-10-13 DIAGNOSIS — R0689 Other abnormalities of breathing: Secondary | ICD-10-CM | POA: Diagnosis not present

## 2016-10-13 DIAGNOSIS — I2729 Other secondary pulmonary hypertension: Secondary | ICD-10-CM | POA: Diagnosis not present

## 2016-10-13 DIAGNOSIS — G9340 Encephalopathy, unspecified: Secondary | ICD-10-CM | POA: Diagnosis not present

## 2016-10-13 DIAGNOSIS — Z95810 Presence of automatic (implantable) cardiac defibrillator: Secondary | ICD-10-CM | POA: Diagnosis not present

## 2016-10-25 DIAGNOSIS — M79622 Pain in left upper arm: Secondary | ICD-10-CM | POA: Diagnosis not present

## 2016-10-25 DIAGNOSIS — K567 Ileus, unspecified: Secondary | ICD-10-CM | POA: Diagnosis not present

## 2016-10-25 DIAGNOSIS — E872 Acidosis: Secondary | ICD-10-CM | POA: Diagnosis present

## 2016-10-25 DIAGNOSIS — I420 Dilated cardiomyopathy: Secondary | ICD-10-CM | POA: Diagnosis not present

## 2016-10-25 DIAGNOSIS — D638 Anemia in other chronic diseases classified elsewhere: Secondary | ICD-10-CM | POA: Diagnosis not present

## 2016-10-25 DIAGNOSIS — Z452 Encounter for adjustment and management of vascular access device: Secondary | ICD-10-CM | POA: Diagnosis not present

## 2016-10-25 DIAGNOSIS — I5022 Chronic systolic (congestive) heart failure: Secondary | ICD-10-CM | POA: Diagnosis present

## 2016-10-25 DIAGNOSIS — R931 Abnormal findings on diagnostic imaging of heart and coronary circulation: Secondary | ICD-10-CM | POA: Diagnosis not present

## 2016-10-25 DIAGNOSIS — J9601 Acute respiratory failure with hypoxia: Secondary | ICD-10-CM | POA: Diagnosis not present

## 2016-10-25 DIAGNOSIS — I330 Acute and subacute infective endocarditis: Secondary | ICD-10-CM | POA: Diagnosis not present

## 2016-10-25 DIAGNOSIS — F419 Anxiety disorder, unspecified: Secondary | ICD-10-CM | POA: Diagnosis not present

## 2016-10-25 DIAGNOSIS — R410 Disorientation, unspecified: Secondary | ICD-10-CM | POA: Diagnosis not present

## 2016-10-25 DIAGNOSIS — Z66 Do not resuscitate: Secondary | ICD-10-CM | POA: Diagnosis not present

## 2016-10-25 DIAGNOSIS — Z8679 Personal history of other diseases of the circulatory system: Secondary | ICD-10-CM | POA: Diagnosis not present

## 2016-10-25 DIAGNOSIS — Z4901 Encounter for fitting and adjustment of extracorporeal dialysis catheter: Secondary | ICD-10-CM | POA: Diagnosis not present

## 2016-10-25 DIAGNOSIS — S06360D Traumatic hemorrhage of cerebrum, unspecified, without loss of consciousness, subsequent encounter: Secondary | ICD-10-CM | POA: Diagnosis not present

## 2016-10-25 DIAGNOSIS — J9602 Acute respiratory failure with hypercapnia: Secondary | ICD-10-CM | POA: Diagnosis not present

## 2016-10-25 DIAGNOSIS — Z515 Encounter for palliative care: Secondary | ICD-10-CM | POA: Diagnosis not present

## 2016-10-25 DIAGNOSIS — J80 Acute respiratory distress syndrome: Secondary | ICD-10-CM | POA: Diagnosis not present

## 2016-10-25 DIAGNOSIS — G7281 Critical illness myopathy: Secondary | ICD-10-CM | POA: Diagnosis not present

## 2016-10-25 DIAGNOSIS — T827XXA Infection and inflammatory reaction due to other cardiac and vascular devices, implants and grafts, initial encounter: Secondary | ICD-10-CM | POA: Diagnosis not present

## 2016-10-25 DIAGNOSIS — J9600 Acute respiratory failure, unspecified whether with hypoxia or hypercapnia: Secondary | ICD-10-CM | POA: Diagnosis not present

## 2016-10-25 DIAGNOSIS — J90 Pleural effusion, not elsewhere classified: Secondary | ICD-10-CM | POA: Diagnosis not present

## 2016-10-25 DIAGNOSIS — J449 Chronic obstructive pulmonary disease, unspecified: Secondary | ICD-10-CM | POA: Diagnosis not present

## 2016-10-25 DIAGNOSIS — G9340 Encephalopathy, unspecified: Secondary | ICD-10-CM | POA: Diagnosis not present

## 2016-10-25 DIAGNOSIS — I110 Hypertensive heart disease with heart failure: Secondary | ICD-10-CM | POA: Diagnosis not present

## 2016-10-25 DIAGNOSIS — J9811 Atelectasis: Secondary | ICD-10-CM | POA: Diagnosis not present

## 2016-10-25 DIAGNOSIS — G936 Cerebral edema: Secondary | ICD-10-CM | POA: Diagnosis not present

## 2016-10-25 DIAGNOSIS — I612 Nontraumatic intracerebral hemorrhage in hemisphere, unspecified: Secondary | ICD-10-CM | POA: Diagnosis not present

## 2016-10-25 DIAGNOSIS — R846 Abnormal cytological findings in specimens from respiratory organs and thorax: Secondary | ICD-10-CM | POA: Diagnosis not present

## 2016-10-25 DIAGNOSIS — Z95810 Presence of automatic (implantable) cardiac defibrillator: Secondary | ICD-10-CM | POA: Diagnosis not present

## 2016-10-25 DIAGNOSIS — A419 Sepsis, unspecified organism: Secondary | ICD-10-CM | POA: Diagnosis not present

## 2016-10-25 DIAGNOSIS — J189 Pneumonia, unspecified organism: Secondary | ICD-10-CM | POA: Diagnosis not present

## 2016-10-25 DIAGNOSIS — R0603 Acute respiratory distress: Secondary | ICD-10-CM | POA: Diagnosis not present

## 2016-10-25 DIAGNOSIS — D708 Other neutropenia: Secondary | ICD-10-CM | POA: Diagnosis not present

## 2016-10-25 DIAGNOSIS — I480 Paroxysmal atrial fibrillation: Secondary | ICD-10-CM | POA: Diagnosis not present

## 2016-10-25 DIAGNOSIS — R109 Unspecified abdominal pain: Secondary | ICD-10-CM | POA: Diagnosis not present

## 2016-10-25 DIAGNOSIS — N170 Acute kidney failure with tubular necrosis: Secondary | ICD-10-CM | POA: Diagnosis present

## 2016-10-25 DIAGNOSIS — E871 Hypo-osmolality and hyponatremia: Secondary | ICD-10-CM | POA: Diagnosis present

## 2016-10-25 DIAGNOSIS — R0600 Dyspnea, unspecified: Secondary | ICD-10-CM | POA: Diagnosis not present

## 2016-10-25 DIAGNOSIS — I340 Nonrheumatic mitral (valve) insufficiency: Secondary | ICD-10-CM | POA: Diagnosis not present

## 2016-10-25 DIAGNOSIS — N179 Acute kidney failure, unspecified: Secondary | ICD-10-CM | POA: Diagnosis not present

## 2016-10-25 DIAGNOSIS — D696 Thrombocytopenia, unspecified: Secondary | ICD-10-CM | POA: Diagnosis not present

## 2016-10-25 DIAGNOSIS — R7881 Bacteremia: Secondary | ICD-10-CM | POA: Diagnosis not present

## 2016-10-25 DIAGNOSIS — R1319 Other dysphagia: Secondary | ICD-10-CM | POA: Diagnosis not present

## 2016-10-25 DIAGNOSIS — I619 Nontraumatic intracerebral hemorrhage, unspecified: Secondary | ICD-10-CM | POA: Diagnosis not present

## 2016-10-25 DIAGNOSIS — Z4502 Encounter for adjustment and management of automatic implantable cardiac defibrillator: Secondary | ICD-10-CM | POA: Diagnosis not present

## 2016-10-25 DIAGNOSIS — R6521 Severe sepsis with septic shock: Secondary | ICD-10-CM | POA: Diagnosis not present

## 2016-10-25 DIAGNOSIS — B965 Pseudomonas (aeruginosa) (mallei) (pseudomallei) as the cause of diseases classified elsewhere: Secondary | ICD-10-CM | POA: Diagnosis not present

## 2016-10-25 DIAGNOSIS — N390 Urinary tract infection, site not specified: Secondary | ICD-10-CM | POA: Diagnosis present

## 2016-10-25 DIAGNOSIS — J151 Pneumonia due to Pseudomonas: Secondary | ICD-10-CM | POA: Diagnosis not present

## 2016-10-25 DIAGNOSIS — E46 Unspecified protein-calorie malnutrition: Secondary | ICD-10-CM | POA: Diagnosis present

## 2016-10-25 DIAGNOSIS — A0472 Enterocolitis due to Clostridium difficile, not specified as recurrent: Secondary | ICD-10-CM | POA: Diagnosis not present

## 2016-10-25 DIAGNOSIS — D693 Immune thrombocytopenic purpura: Secondary | ICD-10-CM | POA: Diagnosis not present

## 2016-10-25 DIAGNOSIS — I509 Heart failure, unspecified: Secondary | ICD-10-CM | POA: Diagnosis not present

## 2016-10-25 DIAGNOSIS — D61818 Other pancytopenia: Secondary | ICD-10-CM | POA: Diagnosis not present

## 2016-10-25 DIAGNOSIS — A4101 Sepsis due to Methicillin susceptible Staphylococcus aureus: Secondary | ICD-10-CM | POA: Diagnosis not present

## 2016-10-25 DIAGNOSIS — D649 Anemia, unspecified: Secondary | ICD-10-CM | POA: Diagnosis not present

## 2016-10-25 DIAGNOSIS — R000 Tachycardia, unspecified: Secondary | ICD-10-CM | POA: Diagnosis not present

## 2016-10-25 DIAGNOSIS — J811 Chronic pulmonary edema: Secondary | ICD-10-CM | POA: Diagnosis not present

## 2016-10-25 DIAGNOSIS — I4891 Unspecified atrial fibrillation: Secondary | ICD-10-CM | POA: Diagnosis not present

## 2016-10-25 DIAGNOSIS — I6200 Nontraumatic subdural hemorrhage, unspecified: Secondary | ICD-10-CM | POA: Diagnosis not present

## 2016-10-25 DIAGNOSIS — R569 Unspecified convulsions: Secondary | ICD-10-CM | POA: Diagnosis not present

## 2016-10-25 DIAGNOSIS — X58XXXD Exposure to other specified factors, subsequent encounter: Secondary | ICD-10-CM | POA: Diagnosis not present

## 2016-10-25 DIAGNOSIS — I130 Hypertensive heart and chronic kidney disease with heart failure and stage 1 through stage 4 chronic kidney disease, or unspecified chronic kidney disease: Secondary | ICD-10-CM | POA: Diagnosis not present

## 2016-10-25 DIAGNOSIS — B9561 Methicillin susceptible Staphylococcus aureus infection as the cause of diseases classified elsewhere: Secondary | ICD-10-CM | POA: Diagnosis not present

## 2016-10-25 DIAGNOSIS — G931 Anoxic brain damage, not elsewhere classified: Secondary | ICD-10-CM | POA: Diagnosis not present

## 2016-10-25 DIAGNOSIS — R0989 Other specified symptoms and signs involving the circulatory and respiratory systems: Secondary | ICD-10-CM | POA: Diagnosis not present

## 2016-10-25 DIAGNOSIS — J9621 Acute and chronic respiratory failure with hypoxia: Secondary | ICD-10-CM | POA: Diagnosis not present

## 2016-10-25 DIAGNOSIS — Z4682 Encounter for fitting and adjustment of non-vascular catheter: Secondary | ICD-10-CM | POA: Diagnosis not present

## 2016-10-25 DIAGNOSIS — I10 Essential (primary) hypertension: Secondary | ICD-10-CM | POA: Diagnosis not present

## 2016-10-25 DIAGNOSIS — I639 Cerebral infarction, unspecified: Secondary | ICD-10-CM | POA: Diagnosis not present

## 2016-10-25 DIAGNOSIS — J918 Pleural effusion in other conditions classified elsewhere: Secondary | ICD-10-CM | POA: Diagnosis present

## 2016-10-25 DIAGNOSIS — R6520 Severe sepsis without septic shock: Secondary | ICD-10-CM | POA: Diagnosis not present

## 2016-10-25 DIAGNOSIS — N183 Chronic kidney disease, stage 3 (moderate): Secondary | ICD-10-CM | POA: Diagnosis not present

## 2016-10-25 DIAGNOSIS — T8112XD Postprocedural septic shock, subsequent encounter: Secondary | ICD-10-CM | POA: Diagnosis not present

## 2016-10-25 DIAGNOSIS — I69251 Hemiplegia and hemiparesis following other nontraumatic intracranial hemorrhage affecting right dominant side: Secondary | ICD-10-CM | POA: Diagnosis not present

## 2016-10-25 DIAGNOSIS — N189 Chronic kidney disease, unspecified: Secondary | ICD-10-CM | POA: Diagnosis not present

## 2016-10-25 DIAGNOSIS — R188 Other ascites: Secondary | ICD-10-CM | POA: Diagnosis not present

## 2016-10-25 DIAGNOSIS — Z931 Gastrostomy status: Secondary | ICD-10-CM | POA: Diagnosis not present

## 2016-10-25 DIAGNOSIS — J9690 Respiratory failure, unspecified, unspecified whether with hypoxia or hypercapnia: Secondary | ICD-10-CM | POA: Diagnosis not present

## 2016-10-25 DIAGNOSIS — Z9911 Dependence on respirator [ventilator] status: Secondary | ICD-10-CM | POA: Diagnosis not present

## 2016-10-25 DIAGNOSIS — A0471 Enterocolitis due to Clostridium difficile, recurrent: Secondary | ICD-10-CM | POA: Diagnosis not present

## 2016-10-25 DIAGNOSIS — I959 Hypotension, unspecified: Secondary | ICD-10-CM | POA: Diagnosis not present

## 2016-12-13 DEATH — deceased

## 2017-07-05 NOTE — Assessment & Plan Note (Deleted)
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 status post adjuvant chemotherapy with dose dense Adriamycin and Cytoxan 4 starting 03/30/2014 followed by Abraxane weekly 7 discontinued 07/27/2014 due to severe toxicities and hospitalizations Chemotherapy toxicities include C. difficile, neutropenic fever, dental infection, SIRS, nausea, diarrhea, fatigue, anemia, hypokalemia, CHF) S/P XRT completed 02/08/2015  Recommendation: since she is ER/PR negative, there is no role of antiestrogen therapy. Congestive heart failure: Has a pacemaker and defibrillator Atrial fibrillation Lower extremity embolism causing gangrene: On anticoagulation, continue with this indefinitely. Multiple heart and lung issues: she appears to be doing reasonably well  Peripheral neuropathyrelated to prior chemotherapy.  Breast Cancer Surveillance: 1. Breast exam 07/05/2017: Normal 2. Mammogram [facility] 04/21/15 scattered calcifications in both breast six-month follow-up mammogram was recommended Postsurgical changes. Breast Density Category C. patient will need a new mammogram --------------------------------------------------------------------------------------------------------------------------------------------------------------------------------- Patient moved to [facility] area to live in an assisted living facility. Denies any symptoms at all. Blood work done today revealed a white count of 1.3 with an [place] of 700. She also had a platelet count of 68.

## 2017-07-06 ENCOUNTER — Other Ambulatory Visit: Payer: Medicare Other

## 2017-07-06 ENCOUNTER — Ambulatory Visit: Payer: Medicare Other | Admitting: Hematology and Oncology

## 2017-07-06 ENCOUNTER — Other Ambulatory Visit: Payer: Self-pay

## 2017-07-06 DIAGNOSIS — Z171 Estrogen receptor negative status [ER-]: Principal | ICD-10-CM

## 2017-07-06 DIAGNOSIS — C50411 Malignant neoplasm of upper-outer quadrant of right female breast: Secondary | ICD-10-CM
